# Patient Record
Sex: Female | Born: 1950
Health system: Southern US, Community
[De-identification: ages and names within clinical notes are randomized; demographics above are authoritative.]

## PROBLEM LIST (undated history)

## (undated) DIAGNOSIS — E119 Type 2 diabetes mellitus without complications: Secondary | ICD-10-CM

## (undated) DIAGNOSIS — H04123 Dry eye syndrome of bilateral lacrimal glands: Secondary | ICD-10-CM

## (undated) DIAGNOSIS — N879 Dysplasia of cervix uteri, unspecified: Secondary | ICD-10-CM

## (undated) DIAGNOSIS — R002 Palpitations: Secondary | ICD-10-CM

## (undated) DIAGNOSIS — Z9889 Other specified postprocedural states: Secondary | ICD-10-CM

## (undated) DIAGNOSIS — K2970 Gastritis, unspecified, without bleeding: Secondary | ICD-10-CM

## (undated) DIAGNOSIS — IMO0002 Reserved for concepts with insufficient information to code with codable children: Secondary | ICD-10-CM

## (undated) DIAGNOSIS — R600 Localized edema: Secondary | ICD-10-CM

## (undated) DIAGNOSIS — I10 Essential (primary) hypertension: Secondary | ICD-10-CM

## (undated) DIAGNOSIS — D509 Iron deficiency anemia, unspecified: Secondary | ICD-10-CM

## (undated) DIAGNOSIS — N83202 Unspecified ovarian cyst, left side: Secondary | ICD-10-CM

## (undated) DIAGNOSIS — M797 Fibromyalgia: Secondary | ICD-10-CM

## (undated) DIAGNOSIS — Z1231 Encounter for screening mammogram for malignant neoplasm of breast: Secondary | ICD-10-CM

## (undated) DIAGNOSIS — Z01419 Encounter for gynecological examination (general) (routine) without abnormal findings: Secondary | ICD-10-CM

## (undated) DIAGNOSIS — F419 Anxiety disorder, unspecified: Secondary | ICD-10-CM

## (undated) DIAGNOSIS — Z1211 Encounter for screening for malignant neoplasm of colon: Secondary | ICD-10-CM

## (undated) DIAGNOSIS — B009 Herpesviral infection, unspecified: Secondary | ICD-10-CM

## (undated) DIAGNOSIS — J310 Chronic rhinitis: Secondary | ICD-10-CM

## (undated) DIAGNOSIS — L270 Generalized skin eruption due to drugs and medicaments taken internally: Secondary | ICD-10-CM

## (undated) DIAGNOSIS — Z0000 Encounter for general adult medical examination without abnormal findings: Secondary | ICD-10-CM

## (undated) DIAGNOSIS — K5790 Diverticulosis of intestine, part unspecified, without perforation or abscess without bleeding: Secondary | ICD-10-CM

## (undated) DIAGNOSIS — I2510 Atherosclerotic heart disease of native coronary artery without angina pectoris: Secondary | ICD-10-CM

## (undated) DIAGNOSIS — K219 Gastro-esophageal reflux disease without esophagitis: Secondary | ICD-10-CM

## (undated) DIAGNOSIS — R05 Cough: Secondary | ICD-10-CM

## (undated) DIAGNOSIS — K209 Esophagitis, unspecified: Secondary | ICD-10-CM

## (undated) DIAGNOSIS — R739 Hyperglycemia, unspecified: Secondary | ICD-10-CM

## (undated) DIAGNOSIS — E785 Hyperlipidemia, unspecified: Secondary | ICD-10-CM

## (undated) DIAGNOSIS — R609 Edema, unspecified: Secondary | ICD-10-CM

## (undated) DIAGNOSIS — M8580 Other specified disorders of bone density and structure, unspecified site: Secondary | ICD-10-CM

## (undated) DIAGNOSIS — J849 Interstitial pulmonary disease, unspecified: Secondary | ICD-10-CM

## (undated) DIAGNOSIS — E039 Hypothyroidism, unspecified: Secondary | ICD-10-CM

## (undated) DIAGNOSIS — F32A Depression, unspecified: Secondary | ICD-10-CM

## (undated) DIAGNOSIS — I499 Cardiac arrhythmia, unspecified: Secondary | ICD-10-CM

## (undated) DIAGNOSIS — S62109A Fracture of unspecified carpal bone, unspecified wrist, initial encounter for closed fracture: Secondary | ICD-10-CM

## (undated) DIAGNOSIS — M1990 Unspecified osteoarthritis, unspecified site: Secondary | ICD-10-CM

## (undated) DIAGNOSIS — I7300 Raynaud's syndrome without gangrene: Secondary | ICD-10-CM

## (undated) DIAGNOSIS — M069 Rheumatoid arthritis, unspecified: Secondary | ICD-10-CM

## (undated) DIAGNOSIS — K2990 Gastroduodenitis, unspecified, without bleeding: Secondary | ICD-10-CM

## (undated) DIAGNOSIS — N289 Disorder of kidney and ureter, unspecified: Secondary | ICD-10-CM

## (undated) DIAGNOSIS — R112 Nausea with vomiting, unspecified: Secondary | ICD-10-CM

## (undated) DIAGNOSIS — R748 Abnormal levels of other serum enzymes: Secondary | ICD-10-CM

## (undated) DIAGNOSIS — Z889 Allergy status to unspecified drugs, medicaments and biological substances status: Secondary | ICD-10-CM

## (undated) HISTORY — DX: Encounter for screening for malignant neoplasm of colon: Z12.11

## (undated) HISTORY — DX: Other specified disorders of bone density and structure, unspecified site: M85.80

## (undated) HISTORY — DX: Abnormal levels of other serum enzymes: R74.8

## (undated) HISTORY — DX: Gastro-esophageal reflux disease without esophagitis: K21.9

## (undated) HISTORY — DX: Encounter for general adult medical examination without abnormal findings: Z00.00

## (undated) HISTORY — DX: Diverticulosis of intestine, part unspecified, without perforation or abscess without bleeding: K57.90

## (undated) HISTORY — DX: Chronic rhinitis: J31.0

## (undated) HISTORY — DX: Unspecified osteoarthritis, unspecified site: M19.90

## (undated) HISTORY — DX: Hyperglycemia, unspecified: R73.9

## (undated) HISTORY — DX: Iron deficiency anemia, unspecified: D50.9

## (undated) HISTORY — DX: Fracture of unspecified carpal bone, unspecified wrist, initial encounter for closed fracture: S62.109A

## (undated) HISTORY — DX: Unspecified ovarian cyst, left side: N83.202

## (undated) HISTORY — DX: Hypothyroidism, unspecified: E03.9

## (undated) HISTORY — DX: Dysplasia of cervix uteri, unspecified: N87.9

## (undated) HISTORY — DX: Generalized skin eruption due to drugs and medicaments taken internally: L27.0

## (undated) HISTORY — DX: Allergy status to unspecified drugs, medicaments and biological substances status: Z88.9

## (undated) HISTORY — DX: Essential (primary) hypertension: I10

## (undated) HISTORY — DX: Gastritis, unspecified, without bleeding: K29.70

## (undated) HISTORY — DX: Interstitial pulmonary disease, unspecified: J84.9

## (undated) HISTORY — DX: Palpitations: R00.2

## (undated) HISTORY — PX: HAND SURGERY: SHX662

## (undated) HISTORY — DX: Herpesviral infection, unspecified: B00.9

## (undated) HISTORY — DX: Reserved for concepts with insufficient information to code with codable children: IMO0002

## (undated) HISTORY — DX: Esophagitis, unspecified: K20.9

## (undated) HISTORY — DX: Raynaud's syndrome without gangrene: I73.00

## (undated) HISTORY — DX: Dry eye syndrome of bilateral lacrimal glands: H04.123

## (undated) HISTORY — DX: Hyperlipidemia, unspecified: E78.5

## (undated) HISTORY — DX: Cough: R05

## (undated) HISTORY — DX: Disorder of kidney and ureter, unspecified: N28.9

## (undated) HISTORY — DX: Localized edema: R60.0

## (undated) HISTORY — DX: Encounter for screening mammogram for malignant neoplasm of breast: Z12.31

## (undated) HISTORY — DX: Encounter for gynecological examination (general) (routine) without abnormal findings: Z01.419

## (undated) HISTORY — DX: Type 2 diabetes mellitus without complications: E11.9

## (undated) HISTORY — DX: Edema, unspecified: R60.9

## (undated) HISTORY — DX: Fibromyalgia: M79.7

## (undated) HISTORY — DX: Gastroduodenitis, unspecified, without bleeding: K29.90

## (undated) HISTORY — PX: LASIK: SHX215

## (undated) HISTORY — PX: ABDOMINAL EXPLORATION SURGERY: SHX538

---

## 1989-10-18 HISTORY — PX: OTHER SURGICAL HISTORY: SHX169

## 1997-11-18 DIAGNOSIS — IMO0002 Reserved for concepts with insufficient information to code with codable children: Secondary | ICD-10-CM

## 1997-11-18 HISTORY — DX: Reserved for concepts with insufficient information to code with codable children: IMO0002

## 2000-06-24 ENCOUNTER — Other Ambulatory Visit: Admission: RE | Admit: 2000-06-24 | Discharge: 2000-06-24 | Payer: Self-pay | Admitting: Family Medicine

## 2000-08-18 HISTORY — PX: COLONOSCOPY: SHX174

## 2000-09-05 ENCOUNTER — Encounter: Payer: Self-pay | Admitting: Gastroenterology

## 2001-08-10 ENCOUNTER — Other Ambulatory Visit: Admission: RE | Admit: 2001-08-10 | Discharge: 2001-08-10 | Payer: Self-pay | Admitting: Family Medicine

## 2002-09-21 ENCOUNTER — Other Ambulatory Visit: Admission: RE | Admit: 2002-09-21 | Discharge: 2002-09-21 | Payer: Self-pay | Admitting: Family Medicine

## 2003-03-01 ENCOUNTER — Other Ambulatory Visit: Admission: RE | Admit: 2003-03-01 | Discharge: 2003-03-01 | Payer: Self-pay | Admitting: Family Medicine

## 2003-09-11 ENCOUNTER — Other Ambulatory Visit: Admission: RE | Admit: 2003-09-11 | Discharge: 2003-09-11 | Payer: Self-pay | Admitting: Family Medicine

## 2004-08-31 ENCOUNTER — Ambulatory Visit: Payer: Self-pay | Admitting: Family Medicine

## 2004-10-29 ENCOUNTER — Ambulatory Visit: Payer: Self-pay | Admitting: Family Medicine

## 2005-02-26 ENCOUNTER — Ambulatory Visit: Payer: Self-pay | Admitting: Family Medicine

## 2005-05-14 ENCOUNTER — Ambulatory Visit: Payer: Self-pay | Admitting: Family Medicine

## 2005-05-26 ENCOUNTER — Ambulatory Visit: Payer: Self-pay

## 2005-06-18 ENCOUNTER — Ambulatory Visit: Payer: Self-pay | Admitting: Family Medicine

## 2005-06-25 ENCOUNTER — Ambulatory Visit: Payer: Self-pay | Admitting: Family Medicine

## 2005-06-25 ENCOUNTER — Other Ambulatory Visit: Admission: RE | Admit: 2005-06-25 | Discharge: 2005-06-25 | Payer: Self-pay | Admitting: Family Medicine

## 2005-06-25 LAB — CONVERTED CEMR LAB: Pap Smear: NORMAL

## 2005-08-17 ENCOUNTER — Ambulatory Visit: Payer: Self-pay | Admitting: Family Medicine

## 2005-08-31 ENCOUNTER — Ambulatory Visit: Payer: Self-pay | Admitting: Family Medicine

## 2006-01-19 ENCOUNTER — Ambulatory Visit: Payer: Self-pay | Admitting: Family Medicine

## 2006-01-28 ENCOUNTER — Ambulatory Visit: Payer: Self-pay | Admitting: Family Medicine

## 2006-08-12 ENCOUNTER — Ambulatory Visit: Payer: Self-pay | Admitting: Family Medicine

## 2006-09-24 ENCOUNTER — Ambulatory Visit: Payer: Self-pay | Admitting: Family Medicine

## 2006-09-26 ENCOUNTER — Ambulatory Visit: Payer: Self-pay | Admitting: Family Medicine

## 2006-11-10 ENCOUNTER — Ambulatory Visit: Payer: Self-pay | Admitting: Family Medicine

## 2006-11-10 LAB — CONVERTED CEMR LAB
Hemoglobin: 10.6 g/dL — ABNORMAL LOW (ref 12.0–15.0)
Iron: 42 ug/dL (ref 42–145)
Saturation Ratios: 7.4 % — ABNORMAL LOW (ref 20.0–50.0)
TSH: 0.26 microintl units/mL — ABNORMAL LOW (ref 0.35–5.50)
Transferrin: 405.5 mg/dL — ABNORMAL HIGH (ref >=212.0)

## 2006-12-08 ENCOUNTER — Ambulatory Visit: Payer: Self-pay | Admitting: Family Medicine

## 2006-12-22 ENCOUNTER — Ambulatory Visit: Payer: Self-pay | Admitting: Family Medicine

## 2006-12-22 LAB — CONVERTED CEMR LAB: TSH: 7.62 microintl units/mL — ABNORMAL HIGH (ref 0.35–5.50)

## 2006-12-30 ENCOUNTER — Ambulatory Visit: Payer: Self-pay | Admitting: Family Medicine

## 2007-01-11 ENCOUNTER — Ambulatory Visit: Payer: Self-pay | Admitting: Family Medicine

## 2007-01-24 ENCOUNTER — Ambulatory Visit: Payer: Self-pay | Admitting: Family Medicine

## 2007-01-24 LAB — CONVERTED CEMR LAB
Eosinophils Relative: 5.3 % — ABNORMAL HIGH (ref 0.0–5.0)
Free T4: 1.1 ng/dL (ref 0.6–1.6)
Hemoglobin: 12.6 g/dL (ref 12.0–15.0)
Lymphocytes Relative: 26.2 % (ref 12.0–46.0)
MCHC: 33.6 g/dL (ref 30.0–36.0)
MCV: 81.1 fL (ref 78.0–100.0)
Monocytes Absolute: 0.7 10*3/uL (ref 0.2–0.7)
Monocytes Relative: 11.7 % — ABNORMAL HIGH (ref 3.0–11.0)
Neutro Abs: 3.5 10*3/uL (ref 1.4–7.7)
Platelets: 365 10*3/uL (ref 150–400)
RBC: 4.62 M/uL (ref 3.87–5.11)
TSH: 0.67 microintl units/mL (ref 0.35–5.50)

## 2007-03-08 ENCOUNTER — Telehealth (INDEPENDENT_AMBULATORY_CARE_PROVIDER_SITE_OTHER): Payer: Self-pay | Admitting: *Deleted

## 2007-03-24 ENCOUNTER — Telehealth: Payer: Self-pay | Admitting: Family Medicine

## 2007-03-28 ENCOUNTER — Encounter: Payer: Self-pay | Admitting: Family Medicine

## 2007-03-28 DIAGNOSIS — K2970 Gastritis, unspecified, without bleeding: Secondary | ICD-10-CM

## 2007-03-28 DIAGNOSIS — F410 Panic disorder [episodic paroxysmal anxiety] without agoraphobia: Secondary | ICD-10-CM | POA: Insufficient documentation

## 2007-03-28 DIAGNOSIS — I7300 Raynaud's syndrome without gangrene: Secondary | ICD-10-CM | POA: Insufficient documentation

## 2007-03-28 DIAGNOSIS — E1169 Type 2 diabetes mellitus with other specified complication: Secondary | ICD-10-CM | POA: Insufficient documentation

## 2007-03-28 DIAGNOSIS — D508 Other iron deficiency anemias: Secondary | ICD-10-CM | POA: Insufficient documentation

## 2007-03-28 DIAGNOSIS — K209 Esophagitis, unspecified without bleeding: Secondary | ICD-10-CM | POA: Insufficient documentation

## 2007-03-28 DIAGNOSIS — E039 Hypothyroidism, unspecified: Secondary | ICD-10-CM | POA: Insufficient documentation

## 2007-03-28 DIAGNOSIS — Z87898 Personal history of other specified conditions: Secondary | ICD-10-CM | POA: Insufficient documentation

## 2007-03-28 DIAGNOSIS — K2990 Gastroduodenitis, unspecified, without bleeding: Secondary | ICD-10-CM

## 2007-03-28 DIAGNOSIS — I499 Cardiac arrhythmia, unspecified: Secondary | ICD-10-CM | POA: Insufficient documentation

## 2007-03-28 DIAGNOSIS — E782 Mixed hyperlipidemia: Secondary | ICD-10-CM

## 2007-03-28 DIAGNOSIS — I059 Rheumatic mitral valve disease, unspecified: Secondary | ICD-10-CM | POA: Insufficient documentation

## 2007-03-28 DIAGNOSIS — K219 Gastro-esophageal reflux disease without esophagitis: Secondary | ICD-10-CM | POA: Insufficient documentation

## 2007-03-28 DIAGNOSIS — H16229 Keratoconjunctivitis sicca, not specified as Sjogren's, unspecified eye: Secondary | ICD-10-CM | POA: Insufficient documentation

## 2007-03-28 DIAGNOSIS — L719 Rosacea, unspecified: Secondary | ICD-10-CM | POA: Insufficient documentation

## 2007-03-28 DIAGNOSIS — M722 Plantar fascial fibromatosis: Secondary | ICD-10-CM | POA: Insufficient documentation

## 2007-03-28 DIAGNOSIS — K589 Irritable bowel syndrome without diarrhea: Secondary | ICD-10-CM | POA: Insufficient documentation

## 2007-03-28 HISTORY — DX: Gastritis, unspecified, without bleeding: K29.70

## 2007-03-28 HISTORY — DX: Esophagitis, unspecified without bleeding: K20.90

## 2007-03-28 HISTORY — DX: Gastroduodenitis, unspecified, without bleeding: K29.90

## 2007-03-29 ENCOUNTER — Ambulatory Visit: Payer: Self-pay | Admitting: Family Medicine

## 2007-03-29 DIAGNOSIS — R609 Edema, unspecified: Secondary | ICD-10-CM | POA: Insufficient documentation

## 2007-04-20 ENCOUNTER — Ambulatory Visit: Payer: Self-pay | Admitting: Family Medicine

## 2007-04-20 LAB — CONVERTED CEMR LAB
Glucose, Urine, Semiquant: NEGATIVE
Ketones, urine, test strip: NEGATIVE
Protein, U semiquant: NEGATIVE
RBC / HPF: 0
WBC Urine, dipstick: NEGATIVE
Yeast, UA: 0
pH: 6.5

## 2007-05-24 ENCOUNTER — Ambulatory Visit: Payer: Self-pay | Admitting: Family Medicine

## 2007-07-25 ENCOUNTER — Encounter: Payer: Self-pay | Admitting: Family Medicine

## 2007-07-26 ENCOUNTER — Telehealth: Payer: Self-pay | Admitting: Family Medicine

## 2007-07-27 ENCOUNTER — Encounter: Payer: Self-pay | Admitting: Family Medicine

## 2007-08-03 ENCOUNTER — Telehealth: Payer: Self-pay | Admitting: Family Medicine

## 2007-08-07 ENCOUNTER — Encounter: Payer: Self-pay | Admitting: Family Medicine

## 2007-08-07 ENCOUNTER — Encounter: Payer: Self-pay | Admitting: Cardiovascular Disease

## 2007-08-09 ENCOUNTER — Telehealth (INDEPENDENT_AMBULATORY_CARE_PROVIDER_SITE_OTHER): Payer: Self-pay | Admitting: *Deleted

## 2007-08-11 ENCOUNTER — Encounter: Payer: Self-pay | Admitting: Family Medicine

## 2007-08-11 ENCOUNTER — Encounter: Payer: Self-pay | Admitting: Cardiovascular Disease

## 2007-08-29 ENCOUNTER — Ambulatory Visit: Payer: Self-pay | Admitting: Family Medicine

## 2007-09-25 ENCOUNTER — Encounter: Payer: Self-pay | Admitting: Family Medicine

## 2007-11-20 ENCOUNTER — Telehealth: Payer: Self-pay | Admitting: Family Medicine

## 2007-11-24 ENCOUNTER — Encounter: Payer: Self-pay | Admitting: Family Medicine

## 2008-01-02 ENCOUNTER — Telehealth: Payer: Self-pay | Admitting: Family Medicine

## 2008-02-23 ENCOUNTER — Telehealth: Payer: Self-pay | Admitting: Family Medicine

## 2008-03-05 ENCOUNTER — Telehealth: Payer: Self-pay | Admitting: Family Medicine

## 2008-03-15 ENCOUNTER — Ambulatory Visit: Payer: Self-pay | Admitting: Family Medicine

## 2008-03-15 LAB — CONVERTED CEMR LAB
Bilirubin Urine: NEGATIVE
Glucose, Urine, Semiquant: NEGATIVE
Ketones, urine, test strip: NEGATIVE
RBC / HPF: 0
WBC Urine, dipstick: NEGATIVE
pH: 7

## 2008-03-18 ENCOUNTER — Ambulatory Visit: Payer: Self-pay | Admitting: Family Medicine

## 2008-03-25 ENCOUNTER — Encounter: Payer: Self-pay | Admitting: Family Medicine

## 2008-03-25 ENCOUNTER — Ambulatory Visit: Payer: Self-pay | Admitting: Family Medicine

## 2008-03-25 ENCOUNTER — Other Ambulatory Visit: Admission: RE | Admit: 2008-03-25 | Discharge: 2008-03-25 | Payer: Self-pay | Admitting: Family Medicine

## 2008-03-25 DIAGNOSIS — M8580 Other specified disorders of bone density and structure, unspecified site: Secondary | ICD-10-CM | POA: Insufficient documentation

## 2008-03-25 DIAGNOSIS — F411 Generalized anxiety disorder: Secondary | ICD-10-CM | POA: Insufficient documentation

## 2008-03-26 ENCOUNTER — Ambulatory Visit: Payer: Self-pay | Admitting: Family Medicine

## 2008-03-26 ENCOUNTER — Encounter: Payer: Self-pay | Admitting: Family Medicine

## 2008-03-27 ENCOUNTER — Encounter (INDEPENDENT_AMBULATORY_CARE_PROVIDER_SITE_OTHER): Payer: Self-pay | Admitting: *Deleted

## 2008-03-27 LAB — CONVERTED CEMR LAB
ALT: 17 units/L (ref 0–35)
Albumin: 3.8 g/dL (ref 3.5–5.2)
Alkaline Phosphatase: 61 units/L (ref 39–117)
CO2: 28 meq/L (ref 19–32)
Calcium: 8.9 mg/dL (ref 8.4–10.5)
Cholesterol: 185 mg/dL (ref 0–200)
GFR calc non Af Amer: 68 mL/min
HCT: 40.2 % (ref 36.0–46.0)
Hemoglobin: 13.8 g/dL (ref 12.0–15.0)
LDL Cholesterol: 122 mg/dL — ABNORMAL HIGH (ref 0–99)
MCV: 90 fL (ref 78.0–100.0)
Neutro Abs: 2.6 10*3/uL (ref 1.4–7.7)
Potassium: 4.3 meq/L (ref 3.5–5.1)
RBC: 4.47 M/uL (ref 3.87–5.11)
Sodium: 139 meq/L (ref 135–145)
Total Bilirubin: 0.5 mg/dL (ref 0.3–1.2)
Total Protein: 6.6 g/dL (ref 6.0–8.3)
Vit D, 1,25-Dihydroxy: 19 — ABNORMAL LOW (ref 30–89)

## 2008-03-29 ENCOUNTER — Telehealth: Payer: Self-pay | Admitting: Family Medicine

## 2008-03-29 ENCOUNTER — Encounter (INDEPENDENT_AMBULATORY_CARE_PROVIDER_SITE_OTHER): Payer: Self-pay | Admitting: *Deleted

## 2008-03-29 LAB — CONVERTED CEMR LAB: Pap Smear: NORMAL

## 2008-04-03 ENCOUNTER — Telehealth: Payer: Self-pay | Admitting: Family Medicine

## 2008-04-04 ENCOUNTER — Encounter (INDEPENDENT_AMBULATORY_CARE_PROVIDER_SITE_OTHER): Payer: Self-pay | Admitting: *Deleted

## 2008-04-25 ENCOUNTER — Telehealth: Payer: Self-pay | Admitting: Family Medicine

## 2008-05-13 ENCOUNTER — Ambulatory Visit: Payer: Self-pay | Admitting: Family Medicine

## 2008-05-13 DIAGNOSIS — R739 Hyperglycemia, unspecified: Secondary | ICD-10-CM | POA: Insufficient documentation

## 2008-05-13 DIAGNOSIS — E559 Vitamin D deficiency, unspecified: Secondary | ICD-10-CM | POA: Insufficient documentation

## 2008-05-13 HISTORY — DX: Hyperglycemia, unspecified: R73.9

## 2008-05-15 LAB — CONVERTED CEMR LAB
BUN: 15 mg/dL (ref 6–23)
CO2: 27 meq/L (ref 19–32)
Calcium: 9.2 mg/dL (ref 8.4–10.5)
GFR calc Af Amer: 83 mL/min
Phosphorus: 4.9 mg/dL — ABNORMAL HIGH (ref 2.3–4.6)
Sodium: 141 meq/L (ref 135–145)
TSH: 0.43 microintl units/mL (ref 0.35–5.50)
Vit D, 1,25-Dihydroxy: 33 (ref 30–89)

## 2008-05-29 ENCOUNTER — Ambulatory Visit: Payer: Self-pay | Admitting: Family Medicine

## 2008-05-30 ENCOUNTER — Encounter: Payer: Self-pay | Admitting: Family Medicine

## 2008-06-10 ENCOUNTER — Telehealth (INDEPENDENT_AMBULATORY_CARE_PROVIDER_SITE_OTHER): Payer: Self-pay | Admitting: *Deleted

## 2008-06-14 ENCOUNTER — Ambulatory Visit: Payer: Self-pay | Admitting: Family Medicine

## 2008-06-14 DIAGNOSIS — F418 Other specified anxiety disorders: Secondary | ICD-10-CM | POA: Insufficient documentation

## 2008-06-14 DIAGNOSIS — F329 Major depressive disorder, single episode, unspecified: Secondary | ICD-10-CM

## 2008-08-14 ENCOUNTER — Ambulatory Visit: Payer: Self-pay | Admitting: Family Medicine

## 2008-08-15 LAB — CONVERTED CEMR LAB
AST: 20 units/L (ref 0–37)
Albumin: 4 g/dL (ref 3.5–5.2)
Alkaline Phosphatase: 76 units/L (ref 39–117)
Bilirubin, Direct: 0.1 mg/dL (ref 0.0–0.3)
Calcium: 8.8 mg/dL (ref 8.4–10.5)
Chloride: 105 meq/L (ref 96–112)
Direct LDL: 123.3 mg/dL
GFR calc non Af Amer: 78 mL/min
HDL: 55 mg/dL (ref >39.0)
Hgb A1c MFr Bld: 6.1 % — ABNORMAL HIGH (ref 4.6–6.0)
TSH: 0.71 microintl units/mL (ref 0.35–5.50)
Total Bilirubin: 0.5 mg/dL (ref 0.3–1.2)
Total CHOL/HDL Ratio: 3.9
VLDL: 14 mg/dL (ref 0–40)

## 2008-09-13 ENCOUNTER — Telehealth: Payer: Self-pay | Admitting: Family Medicine

## 2008-09-13 ENCOUNTER — Encounter: Payer: Self-pay | Admitting: Family Medicine

## 2008-10-01 ENCOUNTER — Ambulatory Visit: Payer: Self-pay | Admitting: Family Medicine

## 2008-11-15 ENCOUNTER — Ambulatory Visit: Payer: Self-pay | Admitting: Family Medicine

## 2008-12-09 ENCOUNTER — Telehealth: Payer: Self-pay | Admitting: Family Medicine

## 2008-12-09 ENCOUNTER — Ambulatory Visit: Payer: Self-pay | Admitting: Family Medicine

## 2009-03-03 ENCOUNTER — Encounter: Payer: Self-pay | Admitting: Family Medicine

## 2009-08-01 ENCOUNTER — Ambulatory Visit: Payer: Self-pay | Admitting: Family Medicine

## 2009-08-04 ENCOUNTER — Telehealth: Payer: Self-pay | Admitting: Family Medicine

## 2009-08-04 LAB — CONVERTED CEMR LAB
ALT: 23 units/L (ref 0–35)
Albumin: 4.4 g/dL (ref 3.5–5.2)
BUN: 20 mg/dL (ref 6–23)
Calcium: 8.9 mg/dL (ref 8.4–10.5)
Cholesterol: 249 mg/dL — ABNORMAL HIGH (ref 0–200)
Direct LDL: 163.3 mg/dL
GFR calc non Af Amer: 77.9 mL/min (ref >60)
Glucose, Bld: 121 mg/dL — ABNORMAL HIGH (ref 70–99)
HDL: 56.5 mg/dL (ref >39.00)
Total CHOL/HDL Ratio: 4
Triglycerides: 115 mg/dL (ref 0.0–149.0)
VLDL: 23 mg/dL (ref 0.0–40.0)

## 2009-08-12 ENCOUNTER — Telehealth: Payer: Self-pay | Admitting: Family Medicine

## 2009-08-25 ENCOUNTER — Ambulatory Visit: Payer: Self-pay | Admitting: Family Medicine

## 2009-09-19 ENCOUNTER — Telehealth: Payer: Self-pay | Admitting: Family Medicine

## 2009-11-24 ENCOUNTER — Ambulatory Visit: Payer: Self-pay | Admitting: Family Medicine

## 2009-11-24 LAB — CONVERTED CEMR LAB
ALT: 19 units/L (ref 0–35)
Albumin: 4.1 g/dL (ref 3.5–5.2)
BUN: 18 mg/dL (ref 6–23)
Chloride: 105 meq/L (ref 96–112)
Cholesterol: 224 mg/dL — ABNORMAL HIGH (ref 0–200)
Creatinine, Ser: 0.8 mg/dL (ref 0.4–1.2)
Direct LDL: 146.1 mg/dL
GFR calc non Af Amer: 77.82 mL/min (ref >60)
Glucose, Bld: 114 mg/dL — ABNORMAL HIGH (ref 70–99)
Hgb A1c MFr Bld: 6.2 % (ref 4.6–6.5)
Potassium: 4.6 meq/L (ref 3.5–5.1)
Sodium: 142 meq/L (ref 135–145)
TSH: 0.42 microintl units/mL (ref 0.35–5.50)
Total CHOL/HDL Ratio: 4
VLDL: 15.4 mg/dL (ref 0.0–40.0)

## 2009-11-28 ENCOUNTER — Ambulatory Visit: Payer: Self-pay | Admitting: Family Medicine

## 2009-12-02 ENCOUNTER — Telehealth: Payer: Self-pay | Admitting: Gastroenterology

## 2009-12-02 ENCOUNTER — Encounter (INDEPENDENT_AMBULATORY_CARE_PROVIDER_SITE_OTHER): Payer: Self-pay | Admitting: *Deleted

## 2009-12-10 ENCOUNTER — Telehealth: Payer: Self-pay | Admitting: Family Medicine

## 2009-12-11 ENCOUNTER — Encounter (INDEPENDENT_AMBULATORY_CARE_PROVIDER_SITE_OTHER): Payer: Self-pay | Admitting: *Deleted

## 2009-12-11 ENCOUNTER — Ambulatory Visit: Payer: Self-pay | Admitting: Family Medicine

## 2009-12-11 ENCOUNTER — Ambulatory Visit: Payer: Self-pay | Admitting: Gastroenterology

## 2009-12-11 DIAGNOSIS — Z789 Other specified health status: Secondary | ICD-10-CM | POA: Insufficient documentation

## 2009-12-15 ENCOUNTER — Ambulatory Visit (HOSPITAL_COMMUNITY): Admission: RE | Admit: 2009-12-15 | Discharge: 2009-12-15 | Payer: Self-pay | Admitting: Gastroenterology

## 2009-12-16 ENCOUNTER — Telehealth: Payer: Self-pay | Admitting: Gastroenterology

## 2009-12-26 ENCOUNTER — Ambulatory Visit: Payer: Self-pay | Admitting: Gastroenterology

## 2009-12-26 LAB — HM COLONOSCOPY

## 2010-01-05 ENCOUNTER — Encounter: Payer: Self-pay | Admitting: Gastroenterology

## 2010-03-05 ENCOUNTER — Encounter: Payer: Self-pay | Admitting: Family Medicine

## 2010-03-12 ENCOUNTER — Encounter: Payer: Self-pay | Admitting: Family Medicine

## 2010-03-18 ENCOUNTER — Ambulatory Visit: Payer: Self-pay | Admitting: Family Medicine

## 2010-03-18 DIAGNOSIS — N390 Urinary tract infection, site not specified: Secondary | ICD-10-CM | POA: Insufficient documentation

## 2010-03-18 DIAGNOSIS — Z889 Allergy status to unspecified drugs, medicaments and biological substances status: Secondary | ICD-10-CM

## 2010-03-18 HISTORY — DX: Allergy status to unspecified drugs, medicaments and biological substances: Z88.9

## 2010-03-18 LAB — CONVERTED CEMR LAB
Bilirubin Urine: NEGATIVE
Casts: 0 /lpf
Ketones, urine, test strip: NEGATIVE
Nitrite: NEGATIVE
Urobilinogen, UA: 0.2

## 2010-03-20 ENCOUNTER — Encounter: Payer: Self-pay | Admitting: Family Medicine

## 2010-03-23 ENCOUNTER — Encounter: Payer: Self-pay | Admitting: Family Medicine

## 2010-04-22 ENCOUNTER — Encounter: Payer: Self-pay | Admitting: Cardiovascular Disease

## 2010-04-27 ENCOUNTER — Ambulatory Visit: Payer: Self-pay | Admitting: Cardiovascular Disease

## 2010-04-27 DIAGNOSIS — R002 Palpitations: Secondary | ICD-10-CM | POA: Insufficient documentation

## 2010-04-28 ENCOUNTER — Encounter: Payer: Self-pay | Admitting: Cardiovascular Disease

## 2010-05-07 ENCOUNTER — Encounter: Payer: Self-pay | Admitting: Cardiovascular Disease

## 2010-05-26 ENCOUNTER — Encounter: Payer: Self-pay | Admitting: Family Medicine

## 2010-06-13 ENCOUNTER — Ambulatory Visit: Payer: Self-pay | Admitting: Family Medicine

## 2010-07-21 ENCOUNTER — Telehealth: Payer: Self-pay | Admitting: Cardiovascular Disease

## 2010-09-22 ENCOUNTER — Telehealth: Payer: Self-pay | Admitting: Family Medicine

## 2010-10-05 ENCOUNTER — Telehealth: Payer: Self-pay | Admitting: Family Medicine

## 2010-10-05 ENCOUNTER — Encounter: Payer: Self-pay | Admitting: Family Medicine

## 2010-11-09 ENCOUNTER — Telehealth: Payer: Self-pay | Admitting: Family Medicine

## 2010-11-17 NOTE — Assessment & Plan Note (Signed)
Summary: COUGH/CLE   Vital Signs:  Patient profile:   60 year old female Height:      62 inches Weight:      202.13 pounds BMI:     37.10 Temp:     99.4 degrees F oral Pulse rate:   88 / minute Pulse rhythm:   regular BP sitting:   102 / 78  (left arm) Cuff size:   large  Vitals Entered By: Delilah Shan CMA Duncan Dull) (December 11, 2009 3:02 PM) CC: Cough   History of Present Illness: 60 yo with 1 week of worsening sinus congestion, frontal headache, runny nose, dry cough. Fever Tmax 101.  No wheezing or shortness of breath. Taking OTC Mucinex with some relief of symptoms. Coughing spells at night, can't get any sleep. No n/v/d. Ears popping.  Current Medications (verified): 1)  Nexium 40 Mg Cpdr (Esomeprazole Magnesium) .... Take One By Mouth Two Times A Day 2)  Altace 10 Mg  Caps (Ramipril) .Marland Kitchen.. 1 By Mouth Once Daily 3)  Synthroid 125 Mcg  Tabs (Levothyroxine Sodium) .... Take One By Mouth Daily 4)  Fish Oil 1000 Mg Caps (Omega-3 Fatty Acids) .... Take One By Mouth Daily 5)  Iron 325 (65 Fe) Mg Tabs (Ferrous Sulfate) .... Take One By Mouth At Bedtime 6)  Maxalt-Mlt 10 Mg Tbdp (Rizatriptan Benzoate) .... Take One By Mouth As Needed As Directed For Migraine 7)  Valtrex 1 Gm Tabs (Valacyclovir Hcl) .... Take Two By Mouth Two Times A Day Prn 8)  Cyclobenzaprine Hcl 10 Mg Tabs (Cyclobenzaprine Hcl) .... Take 1/2 To 1 By Mouth Once Daily and 1 By Mouth At Bedtime 9)  Triamcinolone .1% .... Use On Aff Area Once Daily As Needed (Legs) 10)  Vitamin D 1000 Unit  Tabs (Cholecalciferol) .... Daily 11)  Wellbutrin Xl 150 Mg Xr24h-Tab (Bupropion Hcl) .Marland Kitchen.. 1 By Mouth Once Daily 12)  Hycodan .Marland Kitchen.. 1 Tsp Every 4-6hrs As Needed Cough 13)  Flonase 50 Mcg/act Susp (Fluticasone Propionate) .... Two Spray Each Nostril Daily As Directed 14)  Cardizem La 120 Mg Xr24h-Tab (Diltiazem Hcl Coated Beads) .... Take 1 Tablet By Mouth Once A Day 15)  Aleve 220 Mg Tabs (Naproxen Sodium) .... One Tab Twice A  Day 16)  Multivitamins   Tabs (Multiple Vitamin) .... One Daily 17)  Probiotic  Caps (Probiotic Product) .... Otc As Directed. 18)  Onetouch Test  Strp (Glucose Blood) .... Use As Directed 19)  Aspirin 81 Mg Tabs (Aspirin) .Marland Kitchen.. 1 By Mouth Once Daily 20)  Magnesium ?mg .... Take One Daily 21)  Zantac 300 Mg Tabs (Ranitidine Hcl) .... Take One By Mouth Once Daily 22)  Moviprep 100 Gm  Solr (Peg-Kcl-Nacl-Nasulf-Na Asc-C) .... As Per Prep Instructions. 23)  Levsin/sl 0.125 Mg Subl (Hyoscyamine Sulfate) .Marland Kitchen.. 1 Sl Q 4-6- Hrs As Needed 24)  Zantac 300 Mg Tabs (Ranitidine Hcl) .... Take 1 Tablet By Mouth Once A Day 25)  Avelox 400 Mg  Tabs (Moxifloxacin Hcl) .Marland Kitchen.. 1 Tablet By Mouth Daily X 10 Days 26)  Cheratussin Ac 100-10 Mg/57ml Syrp (Guaifenesin-Codeine) .... 5 Ml Two Times A Day As Needed Cough  Allergies: 1)  ! * Demadex 2)  ! * Aleve 3)  ! Elavil 4)  ! Effexor 5)  ! Paxil 6)  ! Prozac 7)  ! Lyrica 8)  ! Lipitor 9)  Penicillin 10)  Ceftin 11)  Lodine 12)  Tetracycline 13)  Erythromycin 14)  Sulfa 15)  * Zyrtec 16)  Cardizem 17)  Neurontin 18)  Lasix 19)  * Imapramine  Review of Systems      See HPI General:  Complains of chills and fever. ENT:  Complains of earache, nasal congestion, and sinus pressure; denies ear discharge. CV:  Denies chest pain or discomfort. Resp:  Complains of cough; denies shortness of breath, sputum productive, and wheezing.  Physical Exam  General:  overweight but generally well appearing, sounds congested non toxic  Ears:  TMs retracted bilaterally. Nose:  nasal dischargemucosal pallor.   Mouth:  pharynx pink and moist.   Lungs:  Normal respiratory effort, chest expands symmetrically. Lungs are clear to auscultation, no crackles or wheezes. Heart:  Normal rate and regular rhythm. S1 and S2 normal without gallop, murmur, click, rub or other extra sounds. Psych:  normal affect, talkative and pleasant    Impression & Recommendations:  Problem  # 1:  OTHER ACUTE SINUSITIS (ICD-461.8) Assessment New Given duration and progression of symptoms, will treat for bacterial sinusitis with Avelox (has allergy to most abx). Supportive care as per pt instructions. Her updated medication list for this problem includes:    Flonase 50 Mcg/act Susp (Fluticasone propionate) .Marland Kitchen..Marland Kitchen Two spray each nostril daily as directed    Avelox 400 Mg Tabs (Moxifloxacin hcl) .Marland Kitchen... 1 tablet by mouth daily x 10 days    Cheratussin Ac 100-10 Mg/15ml Syrp (Guaifenesin-codeine) .Marland KitchenMarland KitchenMarland KitchenMarland Kitchen 5 ml two times a day as needed cough  Complete Medication List: 1)  Nexium 40 Mg Cpdr (Esomeprazole magnesium) .... Take one by mouth two times a day 2)  Altace 10 Mg Caps (Ramipril) .Marland Kitchen.. 1 by mouth once daily 3)  Synthroid 125 Mcg Tabs (Levothyroxine sodium) .... Take one by mouth daily 4)  Fish Oil 1000 Mg Caps (Omega-3 fatty acids) .... Take one by mouth daily 5)  Iron 325 (65 Fe) Mg Tabs (Ferrous sulfate) .... Take one by mouth at bedtime 6)  Maxalt-mlt 10 Mg Tbdp (Rizatriptan benzoate) .... Take one by mouth as needed as directed for migraine 7)  Valtrex 1 Gm Tabs (Valacyclovir hcl) .... Take two by mouth two times a day prn 8)  Cyclobenzaprine Hcl 10 Mg Tabs (Cyclobenzaprine hcl) .... Take 1/2 to 1 by mouth once daily and 1 by mouth at bedtime 9)  Triamcinolone .1%  .... Use on aff area once daily as needed (legs) 10)  Vitamin D 1000 Unit Tabs (Cholecalciferol) .... Daily 11)  Wellbutrin Xl 150 Mg Xr24h-tab (Bupropion hcl) .Marland Kitchen.. 1 by mouth once daily 12)  Hycodan  .Marland Kitchen.. 1 tsp every 4-6hrs as needed cough 13)  Flonase 50 Mcg/act Susp (Fluticasone propionate) .... Two spray each nostril daily as directed 14)  Cardizem La 120 Mg Xr24h-tab (Diltiazem hcl coated beads) .... Take 1 tablet by mouth once a day 15)  Aleve 220 Mg Tabs (Naproxen sodium) .... One tab twice a day 16)  Multivitamins Tabs (Multiple vitamin) .... One daily 17)  Probiotic Caps (Probiotic product) .... Otc as  directed. 18)  Onetouch Test Strp (Glucose blood) .... Use as directed 19)  Aspirin 81 Mg Tabs (Aspirin) .Marland Kitchen.. 1 by mouth once daily 20)  Magnesium ?mg  .... Take one daily 21)  Zantac 300 Mg Tabs (Ranitidine hcl) .... Take one by mouth once daily 22)  Moviprep 100 Gm Solr (Peg-kcl-nacl-nasulf-na asc-c) .... As per prep instructions. 23)  Levsin/sl 0.125 Mg Subl (Hyoscyamine sulfate) .Marland Kitchen.. 1 sl q 4-6- hrs as needed 24)  Zantac 300 Mg Tabs (Ranitidine hcl) .... Take 1 tablet by mouth once a  day 25)  Avelox 400 Mg Tabs (Moxifloxacin hcl) .Marland Kitchen.. 1 tablet by mouth daily x 10 days 26)  Cheratussin Ac 100-10 Mg/74ml Syrp (Guaifenesin-codeine) .... 5 ml two times a day as needed cough  Patient Instructions: 1)  Take Avelox as directed.  Drink lots of fluids.  Treat sympotmatically with Mucinex, nasal saline irrigation, and Tylenol/Ibuprofen. Also try claritin D or zyrtec D over the counter- two times a day as needed ( have to sign for them at pharmacy). You can use warm compresses.  Cough suppressant at night. Call if not improving as expected in 5-7 days.  Prescriptions: CHERATUSSIN AC 100-10 MG/5ML SYRP (GUAIFENESIN-CODEINE) 5 ml two times a day as needed cough  #4 ounces x 0   Entered and Authorized by:   Ruthe Mannan MD   Signed by:   Ruthe Mannan MD on 12/11/2009   Method used:   Print then Give to Patient   RxID:   [id number] AVELOX 400 MG  TABS (MOXIFLOXACIN HCL) 1 tablet by mouth daily x 10 days  #20 x 0   Entered and Authorized by:   Ruthe Mannan MD   Signed by:   Ruthe Mannan MD on 12/11/2009   Method used:   Electronically to        [facility] [phone]* (retail)       [address] [place], [place]  [address]       Ph: [phone] or [phone]       Fax: [phone]   RxID:   [id number]   Current Allergies (reviewed today): ! * [facility] ! * ALEVE ! ELAVIL ! [facility] ! PAXIL ! PROZAC ! LYRICA !  LIPITOR PENICILLIN CEFTIN LODINE TETRACYCLINE ERYTHROMYCIN SULFA * ZYRTEC CARDIZEM NEURONTIN LASIX * IMAPRAMINE

## 2010-11-17 NOTE — Progress Notes (Signed)
Summary: MEDICATION  Medications Added METOPROLOL SUCCINATE 50 MG XR24H-TAB (METOPROLOL SUCCINATE) one tablet once daily       Phone Note Call from Patient Call back at [facility] Phone [phone] Call back at Work Phone [phone]   Caller: SELF Call For: [facility] Summary of Call: PT WOULD LIKE THE TIMED RELEASE OF METOPROLOL[facility] [address] Initial call taken by: Harlon Flor,  July 21, 2010 9:51 AM    New/Updated Medications: METOPROLOL SUCCINATE 50 MG XR24H-TAB (METOPROLOL SUCCINATE) one tablet once daily Prescriptions: METOPROLOL SUCCINATE 50 MG XR24H-TAB (METOPROLOL SUCCINATE) one tablet once daily  #30 x 1   Entered by:   Bishop Dublin, CMA   Authorized by:   Dossie Arbour MD   Signed by:   Bishop Dublin, CMA on 07/21/2010   Method used:   [facility]ron[facility] to        [facility] [phone]* (retail)       [address] [place], [place]  [address]       Ph: [phone] or [phone]       Fax: [phone]   RxID:   [id number]

## 2010-11-17 NOTE — Procedures (Signed)
Summary: Colon   Colonoscopy  Procedure date:  09/05/2000  Findings:      Location:  [facility].    Colonoscopy  Procedure date:  09/05/2000  Findings:      Location:  [facility].   Patient Name: Tina Berger, Tina Berger MRN:  Procedure Procedures: Colonoscopy CPT: [phone].  Personnel: Endoscopist: Vania Rea. Jarold Motto, MD.  Referred By: Roxy Manns, MD. Sheppard Plumber Earlene Plater, MD.  Indications Symptoms: Constipation Patient has difficulty evacuating, strains with stool passage. Hematochezia.  History  Pre-Exam Physical: Performed Sep 05, 2000. Cardio-pulmonary exam, Rectal exam, Abdominal exam, Extremity exam, Mental status exam WNL.  Exam Exam: Extent of exam reached: Terminal Ileum, extent intended: Cecum.  The cecum was identified by appendiceal orifice and IC valve. Patient position: on left side. Duration of exam: 20 minutes. Colon retroflexion performed. Images taken. ASA Classification: I. Tolerance: excellent.  Colon Prep Used Golytely for colon prep. Prep results: excellent.  Sedation Meds: Fentanyl 100 mcg. Versed 7 mg.  Instrument(s): PFC 160L. Serial [id number]. Serial #0.  Findings - NORMAL EXAM: Ileum to Descending Colon. Not Seen: Polyps. Colitis. Crohn's.  - DIVERTICULOSIS: Descending Colon to Sigmoid Colon. Not bleeding. ICD9: Diverticulosis, Colon: 562.10. Comments: Thickened hypertrophied muscular folds.  - HEMORRHOIDS: External. Size: Medium. Not bleeding. Not thrombosed. ICD9: Hemorrhoids, External: 455.3. Comments: Prominant skin tag noted.   Assessment  Diagnoses: 562.10: Diverticulosis, Colon.  455.3: Hemorrhoids, External.   Comments: Hx. of life-long laxative abuse and dependency!!!!  Will refer to Dr. Kendrick Ranch for hemorrhoidectomy per refractory hemorrhoidal bleeding skin  tags. Events  Unplanned Interventions: No intervention was required.  Plans Medication Plan: Continue current medications.  Patient  Education: Patient given standard instructions for: Hemorrhoids. Constipation. Disposition: After procedure patient sent to recovery. After recovery patient sent home.  Scheduling/Referral: Surgery, to Timothy E. Earlene Plater, MD, around Sep 19, 2000.    This report was created from the original endoscopy report, which was reviewed and signed by the above listed endoscopist.

## 2010-11-17 NOTE — Letter (Signed)
Summary: [facility]  [facility]   Imported By: Lanelle Bal 06/09/2010 14:19:30  _____________________________________________________________________  External Attachment:    Type:   Image     Comment:   External Document  Appended Document: [facility]     Clinical Lists Changes  Allergies: Removed allergy or adverse reaction of MACROBID Observations: Added new observation of PAST MED HX: GERD Hyperlipidemia Hypertension Hypothyroidism Osteoarthritis- hands Anemia-iron deficiency DM 2 diet controlled  edema  raynauds fibromyalgia  dry eyes  allergy visit 8/11 -- macroid challenge ok - not allergic  ortho- Dr Shon Baton   opthy- Dr Inez Pilgrim  (06/09/2010 21:23)       Past Medical History:    GERD    Hyperlipidemia    Hypertension    Hypothyroidism    Osteoarthritis- hands    Anemia-iron deficiency    DM 2 diet controlled     edema     raynauds    fibromyalgia     dry eyes     allergy visit 8/11 -- macroid challenge ok - not allergic        ortho- Dr Shon Baton         opthy- Dr Inez Pilgrim

## 2010-11-17 NOTE — Consult Note (Signed)
Summary: [facility]   Imported By: Lanelle Bal 04/07/2010 13:08:58  _____________________________________________________________________  External Attachment:    Type:   Image     Comment:   External Document

## 2010-11-17 NOTE — Letter (Signed)
Summary: Historic Patient File  Historic Patient File   Imported By: West Carbo 04/28/2010 09:55:23  _____________________________________________________________________  External Attachment:    Type:   Image     Comment:   External Document

## 2010-11-17 NOTE — Letter (Signed)
Summary: Historic Patient File  Historic Patient File   Imported By: West Carbo 04/28/2010 09:55:45  _____________________________________________________________________  External Attachment:    Type:   Image     Comment:   External Document

## 2010-11-17 NOTE — Assessment & Plan Note (Signed)
Summary: Gerd/dfs    History of Present Illness Visit Type: Initial Visit Primary GI MD: Sheryn Bison MD FACP FAGA Primary Provider: Roxy Manns, MD Chief Complaint: Patient states that she has been having the esophageal spasms for about 20 years. She has most recently been evaluated by her ENT Dr. Chestine Spore who suggested she have an EGD to evaluate for barretts. She states that he told her she has alot of acid build up on her vocal cords.   History of Present Illness:   Complex 60 year old white female with a history of fibromyalgia, degenerative arthritis of the spine, peripheral neuropathy,Raynaud's phenomenon of her feet, hypertension, thyroid function disorder, and multiple drug allergies. She presents today complaining of recurrent substernal chest pain radiating to her back she has had on off for 20 years, worse over the last several months. These are then diagnosed as" esophageal spasms" but she has not had previous endoscopy or manometry. She gives a history of chronic gastroesophageal reflux with associated hoarseness and coughing for many years having had ENT evaluations and being placed on twice a day Nexium. She continues to have regurgitation and burning but no dysphagia. She has regular bowel movements but was previously severely constipated while taking Celexa. She had colonoscopy 10 years ago that was unremarkable.  She denies rectal bleeding or melena or any hepatobiliary complaints, anorexia or weight loss. She is allergic to 10 different medications listed in her chart. She has recurrent migraine headaches in addition to her abrupt problems. Other problems are dry eyes syndrome, hyperlipidemia, chronic iron deficiency, and glucose intolerance. She has multiple orthopedic problems with her spine and is under the care of Dr. Shon Baton agreed for orthopedic. She also complains of recent right upper quadrant pain radiating to her right back area has not had previous diagnoses of gallbladder or  liver disease. Family history remarkable for emphysema and lung cancer, one sister with lymphoma, but no known colon cancer. She denies any specific food intolerances.   GI Review of Systems    Reports acid reflux, bloating, and  dysphagia with liquids.      Denies abdominal pain, belching, chest pain, dysphagia with solids, heartburn, loss of appetite, nausea, vomiting, vomiting blood, weight loss, and  weight gain.        Denies anal fissure, black tarry stools, change in bowel habit, constipation, diarrhea, diverticulosis, fecal incontinence, heme positive stool, hemorrhoids, irritable bowel syndrome, jaundice, light color stool, liver problems, rectal bleeding, and  rectal pain.    Current Medications (verified): 1)  Nexium 40 Mg Cpdr (Esomeprazole Magnesium) .... Take One By Mouth Two Times A Day 2)  Altace 10 Mg  Caps (Ramipril) .Marland Kitchen.. 1 By Mouth Once Daily 3)  Synthroid 125 Mcg  Tabs (Levothyroxine Sodium) .... Take One By Mouth Daily 4)  Fish Oil 1000 Mg Caps (Omega-3 Fatty Acids) .... Take One By Mouth Daily 5)  Iron 325 (65 Fe) Mg Tabs (Ferrous Sulfate) .... Take One By Mouth At Bedtime 6)  Maxalt-Mlt 10 Mg Tbdp (Rizatriptan Benzoate) .... Take One By Mouth As Needed As Directed For Migraine 7)  Valtrex 1 Gm Tabs (Valacyclovir Hcl) .... Take Two By Mouth Two Times A Day Prn 8)  Cyclobenzaprine Hcl 10 Mg Tabs (Cyclobenzaprine Hcl) .... Take 1/2 To 1 By Mouth Once Daily and 1 By Mouth At Bedtime 9)  Triamcinolone .1% .... Use On Aff Area Once Daily As Needed (Legs) 10)  Vitamin D 1000 Unit  Tabs (Cholecalciferol) .... Daily 11)  Wellbutrin Xl 150  Mg Xr24h-Tab (Bupropion Hcl) .Marland Kitchen.. 1 By Mouth Once Daily 12)  Hycodan .Marland Kitchen.. 1 Tsp Every 4-6hrs As Needed Cough 13)  Flonase 50 Mcg/act Susp (Fluticasone Propionate) .... Two Spray Each Nostril Daily As Directed 14)  Cardizem La 120 Mg Xr24h-Tab (Diltiazem Hcl Coated Beads) .... Take 1 Tablet By Mouth Once A Day 15)  Aleve 220 Mg Tabs (Naproxen  Sodium) .... One Tab Twice A Day 16)  Multivitamins   Tabs (Multiple Vitamin) .... One Daily 17)  Probiotic  Caps (Probiotic Product) .... Otc As Directed. 18)  Onetouch Test  Strp (Glucose Blood) .... Use As Directed 19)  Aspirin 81 Mg Tabs (Aspirin) .Marland Kitchen.. 1 By Mouth Once Daily 20)  Magnesium ?mg .... Take One Daily 21)  Zantac 300 Mg Tabs (Ranitidine Hcl) .... Take One By Mouth Once Daily  Allergies (verified): 1)  ! * Demadex 2)  ! * Aleve 3)  ! Elavil 4)  ! Effexor 5)  ! Paxil 6)  ! Prozac 7)  ! Lyrica 8)  ! Lipitor 9)  Penicillin 10)  Ceftin 11)  Lodine 12)  Tetracycline 13)  Erythromycin 14)  Sulfa 15)  * Zyrtec 16)  Cardizem 17)  Neurontin 18)  Lasix 19)  * Imapramine  Past History:  Past medical, surgical, family and social histories (including risk factors) reviewed for relevance to current acute and chronic problems.  Past Medical History: Reviewed history from 11/28/2009 and no changes required. GERD Hyperlipidemia Hypertension Hypothyroidism Osteoarthritis- hands Anemia-iron deficiency DM 2 diet controlled  edema  raynauds fibromyalgia  dry eyes    ortho- Dr Shon Baton   opthy- Dr Inez Pilgrim  Past Surgical History: MRI- thoracic, cervical-- slight disk herniations T6,7,8 with DJD  (11/1997) Cervical dysplasia, abnormal paps Left ovarian cysts x 3, rupture Dexa- mild osteopenia (08/2000)  Improved (09/2004) ABI's- normal (05/2005) Colonoscopy- diverticulosis, hemorrhoids (08/2000) Previous hand surgery stress cardiolite nl 10/08 with EF of 69% thumb joint repaired  Family History: Reviewed history from 03/29/2007 and no changes required. Father: deceased age 26- emphysema, smoker Mother: deceased age 64- ? lung cancer, smoker, CAD when relatively young Siblings: 2 brothers, 2 sisters one brother and one sister with lymphomia Family History of Diabetes: brother, sister Family History of Heart Disease: brother  Social History: Reviewed  history from 03/25/2008 and no changes required. 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  The patient denies anemia, anxiety-new, blood in urine, breast changes/lumps, change in vision, confusion, cough, coughing up blood, depression-new, fainting, fever, hearing problems, heart murmur, heart rhythm changes, itching, menstrual pain, muscle pains/cramps, nosebleeds, pregnancy symptoms, shortness of breath, skin rash, sleeping problems, sore throat, swelling of feet/legs, swollen lymph glands, thirst - excessive , urination - excessive , urination changes/pain, and vision changes.   General:  Complains of sweats, fatigue, and weakness; denies fever, chills, anorexia, malaise, weight loss, and sleep disorder. Eyes:  Denies blurring, diplopia, irritation, discharge, vision loss, scotoma, eye pain, and photophobia; dry eyes syndrome. ENT:  Complains of nasal congestion and hoarseness; denies earache, ear discharge, tinnitus, decreased hearing, loss of smell, nosebleeds, sore throat, and difficulty swallowing. CV:  Complains of chest pains; denies angina, palpitations, syncope, dyspnea on exertion, orthopnea, PND, peripheral edema, and claudication. Resp:  Denies  dyspnea at rest, dyspnea with exercise, cough, sputum, wheezing, coughing up blood, and pleurisy. GI:  Complains of constipation; denies difficulty swallowing, pain on swallowing, nausea, indigestion/heartburn, vomiting, vomiting blood, abdominal pain, jaundice, gas/bloating, diarrhea, change in bowel habits, bloody BM's, black BMs, and fecal incontinence. GU:  Complains of urinary incontinence; denies urinary burning, blood in urine, nocturnal urination,  urinary frequency, abnormal vaginal bleeding, amenorrhea, menorrhagia, vaginal discharge, pelvic pain, genital sores, painful intercourse, and decreased libido. MS:  Complains of joint swelling and joint stiffness; denies joint pain / LOM, joint deformity, low back pain, muscle weakness, muscle cramps, muscle atrophy, leg pain at night, leg pain with exertion, and shoulder pain / LOM hand / wrist pain (CTS); vasospastic painful condition of her feet and chronic degenerative disease of her cervical spine. She also stopped and chronic low back pain and a history of fibromyalgia.. Derm:  Denies rash, itching, dry skin, hives, moles, warts, and unhealing ulcers. Neuro:  Complains of frequent headaches; denies weakness, paralysis, abnormal sensation, seizures, syncope, tremors, vertigo, transient blindness, frequent falls, difficulty walking, headache, sciatica, radiculopathy other:, restless legs, memory loss, and confusion; she is on the care of her neurologist for her headaches and peripheral neuropathy.. Psych:  Complains of depression; denies anxiety, memory loss, suicidal ideation, hallucinations, paranoia, phobia, and confusion. Endo:  Complains of cold intolerance; denies heat intolerance, polydipsia, polyphagia, polyuria, unusual weight change, and hirsutism. Heme:  Denies bruising, bleeding, enlarged lymph nodes, and pagophagia. Allergy:  Complains of hay fever.  Vital Signs:  Patient profile:   60 year old female Height:      62 inches Weight:      202.8 pounds BMI:     37.23 Pulse rate:   78 / minute Pulse rhythm:   irregularly irregular BP sitting:   118 / 70  (left arm) Cuff size:   regular  Vitals Entered By: Harlow Mares CMA Duncan Dull) (December 11, 2009 11:00 AM)  Physical Exam  General:  Well developed, well nourished, no acute distress.healthy appearing.   Head:  Normocephalic and atraumatic. Eyes:  PERRLA, no icterus.exam deferred to patient's ophthalmologist.   Neck:  Supple;  no masses or thyromegaly. Lungs:  Clear throughout to auscultation.Scattered wheezes in the left chest area noted. Heart:  Regular rate and rhythm; no murmurs, rubs,  or bruits. Abdomen:  Soft, nontender and nondistended. No masses, hepatosplenomegaly or hernias noted. Normal bowel sounds. Msk:  Symmetrical with no gross deformities. Normal posture.She does have vascular congestion of her lower extremities and feet with very cold feet bilaterally but her hands are not cold or deformed or arthritic. Pulses:  Normal pulses noted. Extremities:  No clubbing, cyanosis, edema or deformities noted. Neurologic:  Alert and  oriented x4;  grossly normal neurologically. Cervical Nodes:  No significant cervical adenopathy. Inguinal Nodes:  No significant inguinal adenopathy. Psych:  Alert and cooperative. Normal mood and affect.   Impression & Recommendations:  Problem # 1:  CHEST PAIN (ICD-786.50) Assessment Deteriorated  Unusual chest pain despite heavy doses of PPIs for presumed acid reflux. She does have a history of Raynaud's phenomenon and may have severe acid reflux and secondary esophageal spasm. I would start her workup with endoscopy followed by probable manometry and 24-hour pH probe testing. She is to bring her lab data in for review. In the interim, she is to continue her reflux regime and b.i.d. PPI therapy. Upper abdominal ultrasound exam also has been scheduled to exclude cholelithiasis.  Orders: Ultrasound Abdomen (UAS) Colon/Endo (Colon/Endo)  Problem #  2:  ANEMIA-IRON DEFICIENCY (ICD-280.9) Assessment: Unchanged  Will review labs and also schedule followup colonoscopy exam since his been 10 years since her last exam.  Orders: Colon/Endo (Colon/Endo)  Problem # 3:  HYPERTENSION (ICD-401.9) Assessment: Improved Blood Pressure today is 118/70 and pulse was 70 and irregular.  Continue all of her blood pressure, thyroid, and lipid medicine as listed. She has multiple drug  allergies listed and reviewed her chart including most antibiotics.  Problem # 4:  SOMATIC DYSFUNCTION (ICD-739.9) Assessment: Unchanged Long history of anxiety and depression and multiple functional disorders. Patient also sees multiple physicians has recently seen Dr. Bosie Clos at [facility]. I do not have his notes for review. Patient also takes p.r.n. NSAIDs. Probiotics, multivitamins, and p.r.n. hydrocodone. She has multiple ENT and orthopedic symptoms which are treated with symptomatic p.r.n. meds.  Patient Instructions: 1)  Avoid foods high in acid content ( tomatoes, citrus juices, spicy foods) . Avoid eating within 3 to 4 hours of lying down or before exercising. Do not over eat; try smaller more frequent meals. Elevate head of bed four inches when sleeping.  2)  Colonoscopy and Flexible Sigmoidoscopy brochure given.  3)  Conscious Sedation brochure given.  4)  Upper Endoscopy brochure given.  5)  Copy sent to : Dr.Marne Tower 6)  The medication list was reviewed and reconciled.  All changed / newly prescribed medications were explained.  A complete medication list was provided to the patient / caregiver. Prescriptions: LEVSIN/SL 0.125 MG SUBL (HYOSCYAMINE SULFATE) 1 sl q 4-6- hrs as needed  #40 x 3   Entered by:   Ashok Cordia RN   Authorized by:   Mardella Layman MD [facility]   Signed by:   Ashok Cordia RN on 12/11/2009   Method used:   Electronically to        [facility] [phone]* (retail)       [address] [place], [place]  [address]       Ph: [phone] or [phone]       Fax: [phone]   RxID:   [id number] MOVIPREP 100 GM  SOLR (PEG-KCL-NACL-NASULF-NA ASC-C) As per prep instructions.  #1 x 0   Entered by:   Ashok Cordia RN   Authorized by:   Mardella Layman MD [facility]   Signed by:   Ashok Cordia RN on 12/11/2009   Method used:   Electronically to        [facility] [phone][facility] (retail)       [address] [place], [place]  [address]       Ph: [phone] or [phone]       Fax: [phone]   RxID:   [id number]

## 2010-11-17 NOTE — Assessment & Plan Note (Signed)
Summary: uti   Vital Signs:  Patient profile:   60 year old female Height:      62 inches Weight:      201.75 pounds BMI:     37.03 Temp:     98.7 degrees F oral Pulse rate:   84 / minute Pulse rhythm:   regular BP sitting:   104 / 70  (left arm) Cuff size:   large  Vitals Entered By: Lewanda Rife LPN (March 19, 4539 9:21 AM) CC: UTI low back pain and not feel well   History of Present Illness: 3 weeks ago - got up with uti-- cramping and urine burning  saw PA and was put on cipro and got a cx  was resistant to the cipro  she also may have had a rash from it (was also in the sun)   then put on macrobid  then got a severe rash - and stopped taking that  [facility]  drank cranberry juice  cx returned that was positive   is sens to gentamycin all to pcn -- had anaphylaxis  sulfa gave her a rash in the past   is interested in testing for abx allergies   now is not as symptomatic  feels like she is generally sick -- some abd soreness  some low back pain  no more burning on urination   Allergies: 1)  ! * Demadex 2)  ! * Aleve 3)  ! Elavil 4)  ! Effexor 5)  ! Paxil 6)  ! Prozac 7)  ! Lyrica 8)  ! Lipitor 9)  ! Cipro 10)  ! Macrobid 11)  ! Cipro 12)  ! Macrobid 13)  Penicillin 14)  Ceftin 15)  Lodine 16)  Tetracycline 17)  Erythromycin 18)  Sulfa 19)  * Zyrtec 20)  Cardizem 21)  Neurontin 22)  Lasix 23)  * Imapramine  Past History:  Past Medical History: Last updated: 11/28/2009 GERD Hyperlipidemia Hypertension Hypothyroidism Osteoarthritis- hands Anemia-iron deficiency DM 2 diet controlled  edema  raynauds fibromyalgia  dry eyes    ortho- Dr Shon Baton   opthy- Dr Inez Pilgrim  Past Surgical History: Last updated: 2009-12-26 MRI- thoracic, cervical-- slight disk herniations T6,7,8 with DJD  (11/1997) Cervical dysplasia, abnormal paps Left ovarian cysts x 3, rupture Dexa- mild osteopenia (08/2000)   Improved (09/2004) ABI's- normal (05/2005) Colonoscopy- diverticulosis, hemorrhoids (08/2000) Previous hand surgery stress cardiolite nl 10/08 with EF of 69% thumb joint repaired  Family History: Last updated: 2009-12-26 Father: deceased age 71- emphysema, smoker Mother: deceased age 71- ? 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 Eyes:  Denies blurring and discharge. ENT:  Denies sore throat. CV:  Denies chest pain or discomfort. Resp:  Denies cough, shortness of breath, and wheezing. GI:  Denies abdominal pain, bloody stools, change in bowel habits, nausea, and vomiting. GU:  Complains of urinary frequency; denies discharge, dysuria, and hematuria. MS:  Complains of low back pain. Derm:  Denies itching, lesion(s), poor wound healing, and  rash. Endo:  Denies cold intolerance, excessive thirst, excessive urination, and heat intolerance. Heme:  Denies abnormal bruising and bleeding.  Physical Exam  General:  overweight but generally well appearing  Head:  normocephalic, atraumatic, and no abnormalities observed.   Mouth:  pharynx pink and moist.   Neck:  No deformities, masses, or tenderness noted. Lungs:  Normal respiratory effort, chest expands symmetrically. Lungs are clear to auscultation, no crackles or wheezes. Heart:  Normal rate and regular rhythm. S1 and S2 normal without gallop, murmur, click, rub or other extra sounds. Abdomen:  no suprapubic  tenderness or fullness felt  Msk:  no CVA tenderness  Extremities:  trace left pedal edema and trace right pedal edema.  hyperemia with warm feet but cold toes baseline   Neurologic:  gait normal and DTRs symmetrical and normal.   Skin:  Intact without suspicious lesions or rashes Cervical Nodes:  No lymphadenopathy noted Inguinal Nodes:  No significant adenopathy Psych:  normal affect, talkative and pleasant   Diabetes Management Exam:    Foot Exam (with socks and/or shoes not present):       Sensory-Pinprick/Light touch:          Left medial foot (L-4): normal          Left dorsal foot (L-5): normal          Left lateral foot (S-1): normal          Right medial foot (L-4): normal          Right dorsal foot (L-5): normal          Right lateral foot (S-1): normal       Sensory-Monofilament:          Left foot: normal          Right foot: normal       Inspection:          Left foot: normal          Right foot: normal       Nails:          Left foot: normal          Right foot: normal   Impression & Recommendations:  Problem # 1:  UTI (ICD-599.0) Assessment New this is multi drug resistant and pt is allergic to many options (incl anaphalaxis with pcn and cepahalosporin and rash with macrobid and sulfa)  will ref to urol for help with this  symptoms are imp with 1 dose of macrobid  enc fluids and update asap if worse or fever/ etc  Orders: Urology Referral (Urology) UA Dipstick W/ Micro (manual) ([address])  Problem # 2:  PERSONAL HISTORY ALLERGY UNSPEC MEDICINAL AGENT (ICD-V14.9) Assessment: New many abx allergies  ref to allergist for some formal testing  Orders: Allergy Referral  (Allergy)  Complete Medication List: 1)  Nexium 40 Mg Cpdr (Esomeprazole magnesium) .... Take one by mouth two times a day 2)  Altace 10 Mg Caps (Ramipril) .Marland Kitchen.. 1 by mouth once daily 3)  Synthroid 125 Mcg Tabs (Levothyroxine sodium) .... Take one by mouth daily 4)  Fish Oil 1000 Mg Caps  (Omega-3 fatty acids) .... Take one by mouth daily 5)  Iron 325 (65 Fe) Mg Tabs (Ferrous sulfate) .... Take one by mouth at bedtime 6)  Maxalt-mlt 10 Mg Tbdp (Rizatriptan benzoate) .... Take one by mouth as needed as directed for migraine 7)  Valtrex 1 Gm Tabs (Valacyclovir hcl) .... Take two by mouth two times a day prn  8)  Cyclobenzaprine Hcl 10 Mg Tabs (Cyclobenzaprine hcl) .... Take 1/2 to 1 by mouth once daily and 1 by mouth at bedtime 9)  Triamcinolone .1%  .... Use on aff area once daily as needed (legs) 10)  Vitamin D 1000 Unit Tabs (Cholecalciferol) .... Daily 11)  Hycodan  .Marland Kitchen.. 1 tsp every 4-6hrs as needed cough 12)  Flonase 50 Mcg/act Susp (Fluticasone propionate) .... Two spray each nostril daily as directed as needed 13)  Cardizem La 120 Mg Xr24h-tab (Diltiazem hcl coated beads) .... Take 1 tablet by mouth once a day 14)  Aleve 220 Mg Tabs (Naproxen sodium) .... One tab twice a day 15)  Multivitamins Tabs (Multiple vitamin) .... One daily 16)  Probiotic Caps (Probiotic product) .... Otc as directed. 17)  Onetouch Test Strp (Glucose blood) .... Use as directed 18)  Aspirin 81 Mg Tabs (Aspirin) .Marland Kitchen.. 1 by mouth once daily 19)  Magnesium ?mg  .... Take one daily 20)  Zantac 300 Mg Tabs (Ranitidine hcl) .... Take one by mouth once daily 21)  Levsin/sl 0.125 Mg Subl (Hyoscyamine sulfate) .Marland Kitchen.. 1 sl q 4-6- hrs as needed 22)  Cheratussin Ac 100-10 Mg/61ml Syrp (Guaifenesin-codeine) .... 5 ml two times a day as needed cough  Patient Instructions: 1)  we will do ref to urology and allergist at check out  2)  keep drinking lots of water and cranberry juice  3)  update me if symptoms worsen   Current Allergies (reviewed today): ! * DEMADEX ! * ALEVE ! ELAVIL ! [facility] ! PAXIL ! PROZAC ! LYRICA ! LIPITOR ! CIPRO ! MACROBID ! CIPRO ! MACROBID PENICILLIN CEFTIN LODINE TETRACYCLINE ERYTHROMYCIN SULFA * ZYRTEC [facility] Nordmann [facility]  Laboratory Results     Urine Tests  Date/Time Received: March 18, 2010 9:24 AM  Date/Time Reported: March 18, 2010 9:24 AM   Routine Urinalysis   Color: yellow Appearance: slight haziness Glucose: negative   (Normal Range: Negative) Bilirubin: negative   (Normal Range: Negative) Ketone: negative   (Normal Range: Negative) Spec. Gravity: 1.010   (Normal Range: 1.003-1.035) Blood: trace-lysed   (Normal Range: Negative) pH: 6.5   (Normal Range: 5.0-8.0) Protein: trace   (Normal Range: Negative) Urobilinogen: 0.2   (Normal Range: 0-1) Nitrite: negative   (Normal Range: Negative) Leukocyte Esterace: negative   (Normal Range: Negative)  Urine Microscopic WBC/HPF: 1-3 RBC/HPF: 1-2 Bacteria/HPF: mild Mucous/HPF: few Epithelial/HPF: 2-4 Crystals/HPF: few Casts/LPF: 0 Yeast/HPF: 0 Other: 0

## 2010-11-17 NOTE — Letter (Signed)
Summary: External Correspondence  External Correspondence   Imported By: [place] 04/22/2010 14:38:59  _____________________________________________________________________  External Attachment:    Type:   Image     Comment:   External Document

## 2010-11-17 NOTE — Letter (Signed)
Summary: [facility] Instructions  Tina Berger  [address] [place], [place] [address]   Phone: [phone]  Fax: [phone]       Tina Berger    [birth year]    MRN: [id number]        Procedure Day /Date: Friday, 12/19/09     Arrival Time: 2:00      Procedure Time: 3:00     Location of Procedure:                    Juliann Pares  [facility] (4th Floor)                        PREPARATION FOR COLONOSCOPY WITH MOVIPREP   Starting 5 days prior to your procedure 12/13/09 do not eat nuts, seeds, popcorn, corn, beans, peas,  salads, or any raw vegetables.  Do not take any fiber supplements (e.g. Metamucil, Citrucel, and Benefiber).  THE DAY BEFORE YOUR PROCEDURE         DATE: 12/18/09  DAY: Thursday  1.  Drink clear liquids the entire day-NO SOLID FOOD  2.  Do not drink anything colored red or purple.  Avoid juices with pulp.  No orange juice.  3.  Drink at least 64 oz. (8 glasses) of fluid/clear liquids during the day to prevent dehydration and help the prep work efficiently.  CLEAR LIQUIDS INCLUDE: Water Jello Ice Popsicles Tea (sugar ok, no milk/cream) Powdered fruit flavored drinks Coffee (sugar ok, no milk/cream) Gatorade Juice: apple, white grape, white cranberry  Lemonade Clear bullion, consomm, broth Carbonated beverages (any kind) Strained chicken noodle soup Hard Candy                             4.  In the morning, mix first dose of MoviPrep solution:    Empty 1 Pouch A and 1 Pouch B into the disposable container    Add lukewarm drinking water to the top line of the container. Mix to dissolve    Refrigerate (mixed solution should be used within 24 hrs)  5.  Begin drinking the prep at 5:00 p.m. The MoviPrep container is divided by 4 marks.   Every 15 minutes drink the solution down to the next mark (approximately 8 oz) until the full liter is complete.   6.  Follow completed prep with 16 oz of clear liquid of your choice (Nothing red or  purple).  Continue to drink clear liquids until bedtime.  7.  Before going to bed, mix second dose of MoviPrep solution:    Empty 1 Pouch A and 1 Pouch B into the disposable container    Add lukewarm drinking water to the top line of the container. Mix to dissolve    Refrigerate  THE DAY OF YOUR PROCEDURE      DATE: 12/19/09  DAY: Friday  Beginning at 10:00 a.m. (5 hours before procedure):         1. Every 15 minutes, drink the solution down to the next mark (approx 8 oz) until the full liter is complete.  2. Follow completed prep with 16 oz. of clear liquid of your choice.    3. You may drink clear liquids until 1:00 (2 HOURS BEFORE PROCEDURE).   MEDICATION INSTRUCTIONS  Unless otherwise instructed, you should take regular prescription medications with a small sip of water   as early as possible the morning of your  procedure.                  OTHER INSTRUCTIONS  You will need a responsible adult at least 61 years of age to accompany you and drive you home.   This person must remain in the waiting room during your procedure.  Wear loose fitting clothing that is easily removed.  Leave jewelry and other valuables at home.  However, you may wish to bring a book to read or  an iPod/MP3 player to listen to music as you wait for your procedure to start.  Remove all body piercing jewelry and leave at home.  Total time from sign-in until discharge is approximately 2-3 hours.  You should go home directly after your procedure and rest.  You can resume normal activities the  day after your procedure.  The day of your procedure you should not:   Drive   Make legal decisions   Operate machinery   Drink alcohol   Return to work  You will receive specific instructions about eating, activities and medications before you leave.    The above instructions have been reviewed and explained to me by   _______________________    I fully understand and can verbalize  these instructions _____________________________ Date _________

## 2010-11-17 NOTE — Assessment & Plan Note (Signed)
Summary: Rapid Hear Beats  Medications Added VERAMYST 27.5 MCG/SPRAY SUSP (FLUTICASONE FUROATE) as directed BENADRYL 25 MG TABS (DIPHENHYDRAMINE HCL) as needed METOPROLOL TARTRATE 25 MG TABS (METOPROLOL TARTRATE) Take one tablet by mouth twice a day      Allergies Added:   Visit Type:  Initial Consult Primary Provider:  Roxy Manns, MD  CC:  c/o irreg. heart beat, swelling in legs, and feet and ankles..  History of Present Illness: Tina Berger is a 60 year old woman with a history of Raynaud's disease, hyperlipidemia, HTN, obesity, chronic lower extremity swelling who presents for evaluation of irregular heartbeats.  She has been told before that she has ectopy. she works a long day, working from 7 in the morning to 7 at nighttime, also taking care of a 51-year-old grandson. She has had a history of chest pain with a workup in the past including echocardiogram and stress test will this was essentially negative. She denies any recent chest pain. she has her palpitations at nighttime when she sits down on the couch around 7:30 to 8:00 PM. She denies any palpitations generally daytime.  she reports having 8 elevated baseline heart rate since she was young. She tried propranolol and she was younger and she believes that this may have contributed to her Raynaud's. She has not tried any additional beta blockers.  Echocardiogram in October 2008 was essentially normal.  Stress test in October 2008 showed no ischemia. This is a exercise treadmill. She exercised for 4 minutes 30 seconds, peak heart rate of 152 beats per minute, peak blood pressure 172/94.  EKG shows normal sinus rhythm with a rate of 91 beats per minute, no significant ST or T wave changes.  Current Medications (verified): 1)  Nexium 40 Mg Cpdr (Esomeprazole Magnesium) .... Take One By Mouth Two Times A Day 2)  Altace 10 Mg  Caps (Ramipril) .Marland Kitchen.. 1 By Mouth Once Daily 3)  Synthroid 125 Mcg  Tabs (Levothyroxine Sodium) .... Take One  By Mouth Daily 4)  Fish Oil 1000 Mg Caps (Omega-3 Fatty Acids) .... Take One By Mouth Daily 5)  Iron 325 (65 Fe) Mg Tabs (Ferrous Sulfate) .... Take One By Mouth At Bedtime 6)  Maxalt-Mlt 10 Mg Tbdp (Rizatriptan Benzoate) .... Take One By Mouth As Needed As Directed For Migraine 7)  Valtrex 1 Gm Tabs (Valacyclovir Hcl) .... Take Two By Mouth Two Times A Day Prn 8)  Cyclobenzaprine Hcl 10 Mg Tabs (Cyclobenzaprine Hcl) .... Take 1/2 To 1 By Mouth Once Daily and 1 By Mouth At Bedtime 9)  Triamcinolone .1% .... Use On Aff Area Once Daily As Needed (Legs) 10)  Hycodan .Marland Kitchen.. 1 Tsp Every 4-6hrs As Needed Cough 11)  Cardizem La 120 Mg Xr24h-Tab (Diltiazem Hcl Coated Beads) .... Take 1 Tablet By Mouth Once A Day 12)  Aleve 220 Mg Tabs (Naproxen Sodium) .... One Tab Twice A Day 13)  Multivitamins   Tabs (Multiple Vitamin) .... One Daily 14)  Probiotic  Caps (Probiotic Product) .... Otc As Directed. 15)  Onetouch Test  Strp (Glucose Blood) .... Use As Directed 16)  Zantac 300 Mg Tabs (Ranitidine Hcl) .... Take One By Mouth Once Daily 17)  Levsin/sl 0.125 Mg Subl (Hyoscyamine Sulfate) .Marland Kitchen.. 1 Sl Q 4-6- Hrs As Needed 18)  Cheratussin Ac 100-10 Mg/80ml Syrp (Guaifenesin-Codeine) .... 5 Ml Two Times A Day As Needed Cough 19)  Veramyst 27.5 Mcg/spray Susp (Fluticasone Furoate) .... As Directed 20)  Benadryl 25 Mg Tabs (Diphenhydramine Hcl) .... As Needed  Allergies (verified): 1)  ! * Demadex 2)  ! * Aleve 3)  ! Elavil 4)  ! Effexor 5)  ! Paxil 6)  ! Prozac 7)  ! Lyrica 8)  ! Lipitor 9)  ! Cipro 10)  ! Macrobid 11)  ! Cipro 12)  ! Macrobid 13)  Penicillin 14)  Ceftin 15)  Lodine 16)  Tetracycline 17)  Erythromycin 18)  Sulfa 19)  * Zyrtec 20)  Cardizem 21)  Neurontin 22)  Lasix 23)  * Imapramine  Past History:  Past Medical History: Last updated: 11/28/2009 GERD Hyperlipidemia Hypertension Hypothyroidism Osteoarthritis- hands Anemia-iron deficiency DM 2 diet controlled  edema    raynauds fibromyalgia  dry eyes    ortho- Dr Shon Baton   opthy- Dr Inez Pilgrim  Past Surgical History: Last updated: 2010-01-07 MRI- thoracic, cervical-- slight disk herniations T6,7,8 with DJD  (11/1997) Cervical dysplasia, abnormal paps Left ovarian cysts x 3, rupture Dexa- mild osteopenia (08/2000)  Improved (09/2004) ABI's- normal (05/2005) Colonoscopy- diverticulosis, hemorrhoids (08/2000) Previous hand surgery stress cardiolite nl 10/08 with EF of 69% thumb joint repaired  Family History: Last updated: January 07, 2010 Father: deceased age 15- emphysema, smoker Mother: deceased age 87- ? 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  The patient denies fever, weight loss, vision loss, decreased hearing, hoarseness, chest pain, syncope, dyspnea on exertion, prolonged cough, abdominal pain, incontinence, muscle weakness, depression, and enlarged lymph nodes.         palpitations in the evening  Vital Signs:  Patient profile:   60 year old female Height:      62 inches Weight:      204 pounds BMI:     37.45 Pulse rate:   103 / minute BP sitting:   131 / 89  (right arm) Cuff size:   large  Vitals Entered By: Bishop Dublin, CMA (April 27, 2010 3:59 PM)  Physical Exam  General:  Well developed, well nourished, in no acute distress. Head:  normocephalic and atraumatic Neck:  Neck supple, no JVD. No masses, thyromegaly or abnormal cervical nodes. Lungs:  Clear bilaterally to  auscultation and percussion. Heart:  Non-displaced PMI, chest non-tender; regular rate and rhythm, S1, S2 without murmurs, rubs or gallops. Carotid upstroke normal, no bruit.  Pedals normal pulses. No edema, no varicosities. Abdomen:  Bowel sounds positive; abdomen soft and non-tender without masses Msk:  Back normal, normal gait. Muscle strength and tone normal. Pulses:  pulses normal in all 4 extremities Extremities:  No clubbing. toes and distal part of her feet appear mottled. Neurologic:  Alert and oriented x 3. Skin:  Intact without lesions or rashes. Psych:  Normal affect.   Impression & Recommendations:  Problem # 1:  PALPITATIONS (ICD-785.1) palpitations are likely due to ectopy as they are described as a flip-flop, are rare and did not persists. They come on at night time at the end of a busy day. We will give her a prescription for metoprolol tartrate 25 mg to be taken p.r.n. b.i.d. for her symptoms. She can try  a half dose b.i.d. or a dose in the evening if she has no symptoms turned the daytime.  The following medications were removed from the medication list:    Aspirin 81 Mg Tabs (Aspirin) .Marland Kitchen... 1 by mouth once daily Her updated medication list for this problem includes:    Altace 10 Mg Caps (Ramipril) .Marland Kitchen... 1 by mouth once daily    Cardizem La 120 Mg Xr24h-tab (Diltiazem hcl coated beads) .Marland Kitchen... Take 1 tablet by mouth once a day    Metoprolol Tartrate 25 Mg Tabs (Metoprolol tartrate) .Marland Kitchen... Take one tablet by mouth twice a day  Problem # 2:  HYPERLIPIDEMIA (ICD-272.4) she reports a history of hyperlipidemia. She is due to have her lipids reevaluated. We will look at these when she has them repeated at work. She had problems previously on Lipitor. We could try low-dose Crestor 5 mg every other day or 2.5 mg daily advancing as tolerated  Problem # 3:  HEALTH MAINTENANCE EXAM (ICD-V70.0) we encouraged her to continue her exercise during the daytime and an effort to lose  weight. This will also help with her cholesterol an HTN.  Patient Instructions: 1)  Your physician recommends that you schedule a follow-up appointment in: as needed  2)  Your physician has recommended you make the following change in your medication: START metoprolol 25mg  two times a day  Prescriptions: METOPROLOL TARTRATE 25 MG TABS (METOPROLOL TARTRATE) Take one tablet by mouth twice a day  #60 x 6   Entered by:   Benedict Needy, RN   Authorized by:   Dossie Arbour MD   Signed by:   Benedict Needy, RN on 04/27/2010   Method used:   Electronically to        [facility]ail[facility]       [address]       [place], [place]  [address]       Ph: [phone]       Fax: [phone]   RxID:   [id number]   Appended Document: Rapid Hear Beats she does have a family history of coronary artery disease. Her brother had bypass surgery. Also her father had a heart attack.  After evaluation of her lipids, total cholesterol 224, LDL 141 several months ago. She may benefit from starting a low dose Crestor. She's indicates she would like to have her labs rechecked at work before starting.

## 2010-11-17 NOTE — Letter (Signed)
Summary: New Patient letter  [facility]  [address] [place], [place] [address]   Phone: [phone]  Fax: [phone]       12/02/2009 MRN: [id number]  [facility] [address] [place], [place]  [address]  Dear [name],  Welcome to the Gastroenterology Division at [facility].    You are scheduled to see Dr.  Jarold Motto on 12-25-09 at 9:30a.m. on the 3rd floor at [facility], [address]  We ask that you try to arrive at our office 15 minutes prior to your appointment time to allow for check-in.  We would like you to complete the enclosed self-administered evaluation form prior to your visit and bring it with you on the day of your appointment.  We will review it with you.  Also, please bring a complete list of all your medications or, if you prefer, bring the medication bottles and we will list them.  Please bring your insurance card so that we may make a copy of it.  If your insurance requires a referral to see a specialist, please bring your referral form from your primary care physician.  Co-payments are due at the time of your visit and may be paid by cash, check or credit card.     Your office visit will consist of a consult with your physician (includes a physical exam), any laboratory testing he/she may order, scheduling of any necessary diagnostic testing (e.g. x-ray, ultrasound, CT-scan), and scheduling of a procedure (e.g. Endoscopy, Colonoscopy) if required.  Please allow enough time on your schedule to allow for any/all of these possibilities.    If you cannot keep your appointment, please call [phone] to cancel or reschedule prior to your appointment date.  This allows [place] the opportunity to schedule an appointment for another patient in need of care.  If you do not cancel or reschedule by 5 p.m. the business day prior to your appointment date, you will be charged a $50.00 late cancellation/no-show fee.    Thank you for choosing  [facility] for your medical needs.  We appreciate the opportunity to care for you.  Please visit [place] at our website  to learn more about our practice.                     Sincerely,                                                             The Gastroenterology Division

## 2010-11-17 NOTE — Letter (Signed)
Summary: Patient Notice-Endo Biopsy Results  [facility]  [address] [place], [place] [address]   Phone: [phone]  Fax: [phone]        January 05, 2010 MRN: [id number]    [facility] [address] [place], [place]  [address]    Dear Ms. HOFFMASTER,  I am pleased to inform you that the biopsies taken during your recent endoscopic examination did not show any evidence of cancer upon pathologic examination.  Additional information/recommendations:  __No further action is needed at this time.  Please follow-up with      your primary care physician for your other healthcare needs.  __ Please call [phone] to schedule a return visit to review      your condition.  _xx_ Continue with the treatment plan as outlined on the day of your      exam.  __ You should have a repeat endoscopic examination for this problem              in _ months/years.   Please call us if you are having persistent problems or have questions about your condition that have not been fully answered at this time.  Sincerely,  Mardella Layman MD [facility]  This letter has been electronically signed by your physician.  Appended Document: Patient Notice-Endo Biopsy Results Letter mailed 3.22.11

## 2010-11-17 NOTE — Progress Notes (Signed)
Summary: Sooner Appt.   Phone Note Call from Patient Call back at Home Phone [phone]   Caller: Patient Call For: Dr. Jarold Motto Reason for Call: Talk to Nurse Summary of Call: Pt is scheduled for 12-25-09 and saw her ENT today and was told she needed to see GI ASAP for acid reflux and "damage to her vocal cords due to the acid reflux." Wants to see if she can be seen sooner. Initial call taken by: Karna Christmas,  December 02, 2009 4:15 PM  Follow-up for Phone Call        LM for pt to call. Lupita Leash Surface RN  December 03, 2009 9:37 AM  Appt scheduled for 12/11/09.  Advised of cancellation fee. Follow-up by: Ashok Cordia RN,  December 03, 2009 9:42 AM

## 2010-11-17 NOTE — Letter (Signed)
Summary: [facility] Immunology  [facility] Rheumat[facility] Immunology   Imported By: Lanelle Bal 05/06/2010 13:33:44  _____________________________________________________________________  External Attachment:    Type:   Image     Comment:   External Document

## 2010-11-17 NOTE — Progress Notes (Signed)
Summary: Maxalt MLT 10mg  and Nexium 40mg   Phone Note Refill Request Call back at [phone] Message from:  [facility] Mail order on September 22, 2010 1:11 PM  Refills Requested: Medication #1:  MAXALT-MLT 10 MG TBDP take one by mouth as needed as directed for migraine  Medication #2:  NEXIUM 40 MG CPDR take one by mouth two times a day [facility] Mail order electronically request refills on Maxalt and Nexium. No last refill date sent. Pt cancelled lab appt 02/2010. Pt was seen by Dr Milinda Antis for UTI 03/18/10.Please advise.    Method Requested: Electronic Initial call taken by: Lewanda Rife LPN,  September 22, 2010 1:12 PM  Follow-up for Phone Call        please schedule f/u with me in feb - thanks  Follow-up by: Judith Part MD,  September 22, 2010 8:01 PM  Additional Follow-up for Phone Call Additional follow up Details #1::        Patient notified as instructed by telephone. Pt scheduled appt with Dr Milinda Antis on 12/02/09 at 8:30am.Medications faxed to [facility] as instructed. Lewanda Rife LPN  September 23, 2010 9:04 AM     New/Updated Medications: NEXIUM 40 MG CPDR (ESOMEPRAZOLE MAGNESIUM) take one by mouth two times a day MAXALT-MLT 10 MG TBDP (RIZATRIPTAN BENZOATE) take one by mouth as needed as directed for migraine Prescriptions: NEXIUM 40 MG CPDR (ESOMEPRAZOLE MAGNESIUM) take one by mouth two times a day  #180 x 3   Entered by:   Lewanda Rife LPN   Authorized by:   Judith Part MD   Signed by:   Lewanda Rife LPN on 55/73/2202   Method used:   Faxed to ...       [facility] (mail-order)             , [place]         Ph: [phone]       Fax: [phone]   RxID:   [id number] MAXALT-MLT 10 MG TBDP (RIZATRIPTAN BENZOATE) take one by mouth as needed as directed for migraine  #27 x 3   Entered by:   Lewanda Rife LPN   Authorized by:   Judith Part MD   Signed by:   Lewanda Rife LPN on 70/35/0093   Method used:   Faxed to ...       [facility] (mail-order)             , [place]         Ph: [phone]      Fax: [phone]   RxID:   [id number]

## 2010-11-17 NOTE — Assessment & Plan Note (Signed)
Summary: UTI/JBB   Vital Signs:  Patient Profile:   60 Years Old Female CC:      cold sore in nostril Height:     62 inches Weight:      203 pounds Temp:     98.6 degrees F oral Pulse rate:   88 / minute Pulse rhythm:   regular Resp:     20 per minute BP sitting:   128 / 76  (right arm) Cuff size:   large  Pt. in pain?   yes    Location:   nose    Intensity:   2    Type:       stinging  Vitals Entered By: Providence Crosby LPN (June 13, 2010 4:48 PM)                   Prior Medications Reviewed Using: Electronic Medical Record and Patient Recall  Current Allergies: ! * [facility] ! * ALEVE ! ELAVIL ! [facility] ! PAXIL ! PROZAC ! LYRICA ! LIPITOR ! CIPRO ! CIPRO ! MACROBID ! * MOLDE AND MILDEW PENICILLIN CEFTIN LODINE TETRACYCLINE ERYTHROMYCIN SULFA * ZYRTEC CARDIZEM NEURONTIN LASIX * IMAPRAMINEHistory of Present Illness History from: patient Reason for visit: ? COLD SORE IN NOSE Chief Complaint: cold sore in nostril History of Present Illness: This patient presented today because she is having a recurrent bout of herpes in the nose.  She has been using Valtrex for many years to treat the outbreaks but repStated symptoms started this amorts that her prescription has expired because she hasn't used it in years.  Pt reports that she has the usual tingling, burning sensation in the right upper frontal nares from the cold sore that is present there.  She reports that she normally experiences an acute worsening of the lesions inside the nares if she does not treat it with Valtrex.  The patient denies fever, chills, nausea, vomiting and shortness of breath.    Current Problems: HERPES SIMPLEX WITHOUT MENTION OF COMPLICATION (ICD-054.9) PALPITATIONS (ICD-785.1) PERSONAL HISTORY ALLERGY UNSPEC MEDICINAL AGENT (ICD-V14.9) UTI (ICD-599.0) SOMATIC DYSFUNCTION (ICD-739.9) DEPRESSION (ICD-311) DIABETES MELLITUS, TYPE II (ICD-250.00) UNSPECIFIED VITAMIN D DEFICIENCY  (ICD-268.9) OSTEOPENIA (ICD-733.90) ANXIETY (ICD-300.00) ROUTINE GYNECOLOGICAL EXAMINATION (ICD-V72.31) HEALTH MAINTENANCE EXAM (ICD-V70.0) OTHER SCREENING MAMMOGRAM (ICD-V76.12) SYMPTOM, EDEMA (ICD-782.3) ANEMIA-IRON DEFICIENCY (ICD-280.9) OSTEOARTHRITIS (ICD-715.90) Hx of KERATOCONJUNCTIVITIS SICCA (ICD-370.33) Hx of ROSACEA (ICD-695.3) Hx of ABNORMAL HEART RHYTHMS (ICD-427.9) Hx of IBS (ICD-564.1) Hx of PANIC ATTACK (ICD-300.01) Hosp for ESOPHAGITIS (ICD-530.10) Hx of GASTRITIS (ICD-535.50) MIGRAINES, HX OF (ICD-V13.8) Hx of MITRAL VALVE PROLAPSE (ICD-424.0) Hx of BACK PAIN (ICD-724.5) Hx of PLANTAR FASCIITIS (ICD-728.71) Hx of RAYNAUD'S SYNDROME (ICD-443.0) Hx of FIBROMYALGIA (ICD-729.1) HYPOTHYROIDISM (ICD-244.9) HYPERTENSION (ICD-401.9) HYPERLIPIDEMIA (ICD-272.4) GERD (ICD-530.81)   Current Meds NEXIUM 40 MG CPDR (ESOMEPRAZOLE MAGNESIUM) take one by mouth two times a day ALTACE 10 MG  CAPS (RAMIPRIL) 1 by mouth once daily SYNTHROID 125 MCG  TABS (LEVOTHYROXINE SODIUM) take one by mouth daily FISH OIL 1000 MG CAPS (OMEGA-3 FATTY ACIDS) take one by mouth daily IRON 325 (65 FE) MG TABS (FERROUS SULFATE) take one by mouth at bedtime MAXALT-MLT 10 MG TBDP (RIZATRIPTAN BENZOATE) take one by mouth as needed as directed for migraine VALTREX 1 GM TABS (VALACYCLOVIR HCL) take two by mouth two times a day prn CYCLOBENZAPRINE HCL 10 MG TABS (CYCLOBENZAPRINE HCL) take 1/2 to 1 by mouth once daily and 1 by mouth at bedtime * TRIAMCINOLONE .1% use on aff area once daily as needed (legs) * HYCODAN 1  tsp every 4-6hrs as needed cough CARDIZEM LA 120 MG XR24H-TAB (DILTIAZEM HCL COATED BEADS) Take 1 tablet by mouth once a day ALEVE 220 MG TABS (NAPROXEN SODIUM) one tab twice a day MULTIVITAMINS   TABS (MULTIPLE VITAMIN) one daily PROBIOTIC  CAPS (PROBIOTIC PRODUCT) OTC As directed. ONETOUCH TEST  STRP (GLUCOSE BLOOD) use as directed ZANTAC 300 MG TABS (RANITIDINE HCL) take one by mouth  once daily LEVSIN/SL 0.125 MG SUBL (HYOSCYAMINE SULFATE) 1 sl q 4-6- hrs as needed CHERATUSSIN AC 100-10 MG/5ML SYRP (GUAIFENESIN-CODEINE) 5 ml two times a day as needed cough VERAMYST 27.5 MCG/SPRAY SUSP (FLUTICASONE FUROATE) as directed BENADRYL 25 MG TABS (DIPHENHYDRAMINE HCL) as needed * 5-HPT 200MG  1 AT BEDTIME BY MOUTH OMEGA-3 CF 1000 MG CAPS (OMEGA-3 FATTY ACIDS) 1 DAILY BY MOUTH DIGESTIVE ENZYMES  TABS (DIGESTIVE ENZYMES) 2 TABLETS DAILY BY MOUTH  REVIEW OF SYSTEMS Constitutional Symptoms      Denies fever, chills, night sweats, weight loss, weight gain, and fatigue.  Eyes       Denies change in vision, eye pain, eye discharge, glasses, contact lenses, and eye surgery. Ear/Nose/Throat/Mouth       Denies hearing loss/aids, change in hearing, ear pain, ear discharge, dizziness, frequent runny nose, frequent nose bleeds, sinus problems, sore throat, hoarseness, and tooth pain or bleeding.      Comments: cold sore lesion in the right upper frontal nares, painful, burning tingling sensation Respiratory       Denies dry cough, productive cough, wheezing, shortness of breath, asthma, bronchitis, and emphysema/COPD.  Cardiovascular       Denies murmurs, chest pain, and tires easily with exhertion.    Gastrointestinal       Denies stomach pain, nausea/vomiting, diarrhea, constipation, blood in bowel movements, and indigestion. Genitourniary       Denies painful urination, kidney stones, and loss of urinary control. Neurological       Denies paralysis, seizures, and fainting/blackouts. Musculoskeletal       Denies muscle pain, joint pain, joint stiffness, decreased range of motion, redness, swelling, muscle weakness, and gout.  Skin       Denies bruising, unusual mles/lumps or sores, and hair/skin or nail changes.  Psych       Denies mood changes, temper/anger issues, anxiety/stress, speech problems, depression, and sleep problems.  Past History:  Family History: Last updated:  2010/01/04 Father: deceased age 31- emphysema, smoker Mother: deceased age 48- ? 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 MRI- thoracic, cervical-- slight disk herniations T6,7,8 with DJD  (11/1997) Cervical dysplasia, abnormal paps Left ovarian cysts x 3, rupture Dexa- mild osteopenia (08/2000)  Improved (09/2004) ABI's- normal (05/2005) Colonoscopy- diverticulosis, hemorrhoids (08/2000) Previous hand surgery stress cardiolite nl 10/08 with EF of 69% thumb joint repaired  Family History: Reviewed  history from 12/11/2009 and no changes required. Father: deceased age 76- emphysema, smoker Mother: deceased age 38- ? lung cancer, smoker, CAD when relatively young Siblings: 2 brothers, 2 sisters one brother and one sister with lymphomia Family History of Diabetes: brother, sister Family History of Heart Disease: brother  Social History: Reviewed history from 12/11/2009 and no changes required. 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  Oral/Pharynx: tongue normal, posterior pharynx without erythema or exudate Neck: neck supple,  trachea midline, no masses Chest/Lungs: no rales, wheezes, or rhonchi bilateral, breath sounds equal without effort Heart: regular rate and  rhythm, no murmur Abdomen: soft, non-tender without obvious organomegaly Extremities: normal extremities Neurological: grossly intact and non-focal Skin: no obvious rashes or lesions MSE: oriented to time, place, and person Assessment  Assessed UTI as improved - Clanford Johnson MD Assessed PERSONAL HISTORY ALLERGY UNSPEC MEDICINAL AGENT as unchanged - Clanford Johnson MD New Problems: HERPES SIMPLEX WITHOUT MENTION OF COMPLICATION (ICD-054.9)   Plan New Medications/Changes: VALTREX 1 GM TABS (VALACYCLOVIR HCL) take two by mouth two times a day prn  #4 x 1, 06/13/2010, Clanford Johnson MD  New Orders: Est. Patient Level IV [81191]  The patient and/or caregiver has been counseled thoroughly with regard to medications prescribed including dosage, schedule, interactions, rationale for use, and possible side effects and they verbalize understanding.  Diagnoses and expected course of recovery discussed and will return if not improved as expected or if the condition worsens. Patient and/or caregiver verbalized understanding.  Prescriptions: VALTREX 1 GM TABS (VALACYCLOVIR HCL) take two by mouth two times a day prn  #4 x 1   Entered and Authorized by:   Standley Dakins MD   Signed by:   Standley Dakins MD on 06/13/2010   Method used:   Electronically to        [facility] [employer] [phone]* (retail)       [address]  [place], [place]  [address]       Ph: [phone] or [phone]       Fax: [phone]   RxID:   [id number]   Patient Instructions: 1)  It was clearly explained to the patient that this [facility] is not intended to be a primary care clinic.  The patient is always better served by the continuity of care and the provider/patient relationships developed with their dedicated primary care provider.  The patient was told to be sure to follow up as soon as possible with their primary care provider to discuss treatments received and to receive further examination and testing.  The patient verbalized understanding. The will f/u with PCP ASAP.  2)  The risks, benefits and possible side effects were clearly explained and discussed with the patient.  The patient verbalized clear understanding.  The patient was given instructions to return if symptoms don't improve, worsen or new changes develop.  If it is not during clinic hours and the patient cannot get back to this clinic then the patient was told to seek medical care at an available  urgent care or emergency department.  The patient verbalized understanding.   3)  The patient's prescriptions were checked for possible interactions and electronically sent to the pharmacy of choice.   4)  The patient was informed that there is no on-call provider or services available at this clinic during off-hours (when the clinic is closed).  If the patient developed a problem or concern that required immediate attention, the patient was advised to go the the nearest available urgent care or emergency department for medical care.  The patient verbalized understanding.     Orders Added: 1)  Est. Patient Level IV [16109]  Rodney Langton, M.D., F.A.A.F.P.  June 13, 2010

## 2010-11-17 NOTE — Assessment & Plan Note (Signed)
Summary: 3 MONTH FOLLOWUP/RBH   Vital Signs:  Patient profile:   60 year old female Height:      62 inches Weight:      203.75 pounds BMI:     37.40 Temp:     98.3 degrees F oral Pulse rate:   80 / minute Pulse rhythm:   regular BP sitting:   116 / 70  (left arm) Cuff size:   large  Vitals Entered By: Lewanda Rife LPN (November 28, 2009 8:29 AM)  History of Present Illness: here for f/u of lipids and DM and HTN and thyroid   overall has been feeling pretty good   had esophageal spasm in december - lasted 20 min in the grocery store  then intermittent choking sensation on and off  then next day had lower back spasm and got shot of [facility]adol at [facility] and some pain pills that got a lot better  did f/u with ortho Dr Shon Baton -- and did thoracic films - scoliosis , then MRI - degeneration and mild bulging disks  sent her to GI for the spasm-- and they wanted her to do EGD -- she was unhappy with that visit and decided not to do that  noticed hoarseness and has upcoming f/u with ENT   has stopped sugar and art sweetners except stuvia -- has noticed some imp   also weaned herself off cymbalta due to wt gain -- is off for 10 days and feels fine  lipids are down slt with LDL 146 (from 160) and HDL up a bit  diet  has been walking and doing pilates  joined wt watchers - but does not like it   AIC is 6.2 up from 6.0 now is really giving up a lot of sugar  opthy-- was about a year ago -- will make her own appt   wt is down 2 lb feels good with what she is doing   bp very well controlled - 116/70 today   thyroid stable with theraputic tsh   had her flu shot   Allergies: 1)  ! * Demadex 2)  ! * Aleve 3)  ! Elavil 4)  ! Effexor 5)  ! Paxil 6)  ! Prozac 7)  ! Lyrica 8)  ! Lipitor 9)  Penicillin 10)  Ceftin 11)  Lodine 12)  Tetracycline 13)  Erythromycin 14)  Sulfa 15)  * Zyrtec 16)  Cardizem 17)  Neurontin 18)  Lasix 19)  * Imapramine  Past History:  Past Surgical  History: Last updated: 08/20/2007 MRI- thoracic, cervical-- slight disk herniations T6,7,8 with DJD  (11/1997) Cervical dysplasia, abnormal paps Left ovarian cysts x 3, rupture Dexa- mild osteopenia (08/2000)  Improved (09/2004) ABI's- normal (05/2005) Colonoscopy- diverticulosis, hemorrhoids (08/2000) Previous hand surgery stress cardiolite nl 10/08 with EF of 69%  Family History: Last updated: 04-06-07 Father: deceased age 57- emphysema, smoker Mother: deceased age 27- ? 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 Eyes:  Denies blurring and  eye pain. CV:  Denies chest pain or discomfort, lightheadness, palpitations, and shortness of breath with exertion. Resp:  Denies cough and wheezing. GI:  Complains of indigestion; denies change in bowel habits, loss of appetite, and nausea. GU:  Denies dysuria and urinary frequency. MS:  Complains of joint pain, muscle aches, and stiffness. Derm:  Denies lesion(s), poor wound healing, and rash. Neuro:  Denies numbness and tingling. Psych:  mood is some better . Endo:  Denies excessive thirst and excessive urination. Heme:  Denies abnormal bruising and bleeding.  Physical Exam  General:  overweight but generally well appearing  Head:  normocephalic, atraumatic, and no abnormalities observed.  Eyes:  vision grossly intact, pupils equal, pupils  round, and pupils reactive to light.   Mouth:  pharynx pink and moist.   Neck:  supple with full rom and no masses or thyromegally, no JVD or carotid bruit  Lungs:  Normal respiratory effort, chest expands symmetrically. Lungs are clear to auscultation, no crackles or wheezes. Heart:  Normal rate and regular rhythm. S1 and S2 normal without gallop, murmur, click, rub or other extra sounds. Abdomen:  Bowel sounds positive,abdomen soft and non-tender without masses, organomegaly or hernias noted. no renal bruits  Msk:  no new joint changes tender points noted on back/spine  feet/legs are hyperemic and normal temperature -- mildy dusky Pulses:  R and L carotid,radial,femoral,dorsalis pedis and posterior tibial pulses are full and equal bilaterally Extremities:  trace left pedal edema and trace right pedal edema.  hyperemia with warm feet but cold toes baseline   Neurologic:  sensation intact to light touch, gait normal, and DTRs symmetrical and normal.   Skin:  Intact without suspicious lesions or rashes Cervical Nodes:  No lymphadenopathy noted Psych:  normal affect, talkative and pleasant    Impression & Recommendations:  Problem # 1:  PURE HYPERCHOLESTEROLEMIA (ICD-272.0) Assessment Improved  overall not at goal with diet - but pt refuses medication  rev low sat fat diet will check intermittently  disc goals for HDL and LDL to prev vascular disease   Labs Reviewed: SGOT: 18 (11/24/2009)   SGPT: 19 (11/24/2009)   HDL:63.60 (11/24/2009), 56.50 (08/01/2009)  LDL:DEL (08/14/2008), 122 (03/25/2008)  Chol:224 (11/24/2009), 249 (08/01/2009)  Trig:77.0 (11/24/2009), 115.0 (08/01/2009)  Problem # 2:  DIABETES MELLITUS, TYPE II (ICD-250.00) Assessment: Deteriorated  slt worse with holiday eating - now back on track  utd opthy  doing well with low sugar diet and exercise stressed imp of wt loss  plan lab and f/u 3 mo  Her updated medication list for this problem includes:    Altace 10  Mg Caps (Ramipril) .Marland Kitchen... 1 by mouth once daily    Aspirin 81 Mg Tabs (Aspirin) .Marland Kitchen... 1 by mouth once daily  Labs Reviewed: Creat: 0.8 (11/24/2009)     Last Eye Exam: normal (10/27/2008) Reviewed HgBA1c results: 6.2 (11/24/2009)  6.0 (08/01/2009)  Problem # 3:  HYPOTHYROIDISM (ICD-244.9) Assessment: Unchanged  clinically stable with nl tsh  continue to monitor  Her updated medication list for this problem includes:    Synthroid 125 Mcg Tabs (Levothyroxine sodium) .Marland Kitchen... Take one by mouth daily  Labs Reviewed: TSH: 0.42 (11/24/2009)    HgBA1c: 6.2 (11/24/2009) Chol: 224 (11/24/2009)   HDL: 63.60 (11/24/2009)   LDL: DEL (08/14/2008)   TG: 77.0 (11/24/2009)  Problem # 4:  GERD (ICD-530.81) Assessment: Deteriorated for ent f/u for hoarseness soon  will consider GI ref to [place] if esoph spasm returns Her updated medication list for this  problem includes:    Nexium 40 Mg Cpdr (Esomeprazole magnesium) .Marland Kitchen... Take one by mouth two times a day  Problem # 5:  HYPERTENSION (ICD-401.9) Assessment: Unchanged  very well controlled disc healthy diet (low simple sugar/ choose complex carbs/ low sat fat) diet and exercise in detail  no change in med lab and f/u 3 mo  Her updated medication list for this problem includes:    Altace 10 Mg Caps (Ramipril) .Marland Kitchen... 1 by mouth once daily    Cardizem La 120 Mg Xr24h-tab (Diltiazem hcl coated beads) .Marland Kitchen... Take 1 tablet by mouth once a day  BP today: 116/70 Prior BP: 112/72 (08/25/2009)  Labs Reviewed: K+: 4.6 (11/24/2009) Creat: : 0.8 (11/24/2009)   Chol: 224 (11/24/2009)   HDL: 63.60 (11/24/2009)   LDL: DEL (08/14/2008)   TG: 77.0 (11/24/2009)  Complete Medication List: 1)  Nexium 40 Mg Cpdr (Esomeprazole magnesium) .... Take one by mouth two times a day 2)  Altace 10 Mg Caps (Ramipril) .Marland Kitchen.. 1 by mouth once daily 3)  Synthroid 125 Mcg Tabs (Levothyroxine sodium) .... Take one by mouth daily 4)  Fish Oil 1000 Mg Caps (Omega-3 fatty acids)  .... Take one by mouth daily 5)  Iron 325 (65 Fe) Mg Tabs (Ferrous sulfate) .... Take one by mouth at bedtime 6)  Maxalt-mlt 10 Mg Tbdp (Rizatriptan benzoate) .... Take one by mouth as needed as directed for migraine 7)  Valtrex 1 Gm Tabs (Valacyclovir hcl) .... Take two by mouth two times a day prn 8)  Cyclobenzaprine Hcl 10 Mg Tabs (Cyclobenzaprine hcl) .... Take 1/2 to 1 by mouth once daily and 1 by mouth at bedtime 9)  Triamcinolone .1%  .... Use on aff area once daily as needed (legs) 10)  Vitamin D 1000 Unit Tabs (Cholecalciferol) .... Daily 11)  Wellbutrin Xl 150 Mg Xr24h-tab (Bupropion hcl) .Marland Kitchen.. 1 by mouth once daily 12)  Hycodan  .Marland Kitchen.. 1 tsp every 4-6hrs as needed cough 13)  Flonase 50 Mcg/act Susp (Fluticasone propionate) .... Two spray each nostril daily as directed 14)  Cardizem La 120 Mg Xr24h-tab (Diltiazem hcl coated beads) .... Take 1 tablet by mouth once a day 15)  Aleve 220 Mg Tabs (Naproxen sodium) .... One tab twice a day 16)  Multivitamins Tabs (Multiple vitamin) .... One daily 17)  Probiotic Caps (Probiotic product) .... Otc as directed. 18)  Onetouch Test Strp (Glucose blood) .... Use as directed 19)  Aspirin 81 Mg Tabs (Aspirin) .Marland Kitchen.. 1 by mouth once daily 20)  Magnesium ?mg  .... Take one daily  Patient Instructions: 1)  make sure to see your eye doctor for yearly exam  2)  see your ENT as planned  3)  if GI symptoms do not improve further - please call for referral to [facility] specialist  4)  keep working hard on diet and exercise and weight loss  5)  schedule non fasting lab in 3 months AIC and renal panel 250.0 Prescriptions: CARDIZEM LA 120 MG XR24H-TAB (DILTIAZEM HCL COATED BEADS) Take 1 tablet by mouth once a day  #90 x 3   Entered and Authorized by:   Judith Part MD   Signed by:   Judith Part MD on 11/28/2009   Method used:   Print then Give to Patient   RxID:   [id number] CARDIZEM LA 120 MG XR24H-TAB (DILTIAZEM HCL COATED BEADS) Take 1  tablet by mouth once a day  #14 x 0   Entered and Authorized by:  Judith Part MD   Signed by:   Judith Part MD on 11/28/2009   Method used:   Print then Give to Patient   RxID:   [id number] ALTACE 10 MG  CAPS (RAMIPRIL) 1 by mouth once daily  #90 x 3   Entered and Authorized by:   Judith Part MD   Signed by:   Judith Part MD on 11/28/2009   Method used:   Print then Give to Patient   RxID:   [id number] ALTACE 10 MG  CAPS (RAMIPRIL) 1 by mouth once daily  #14 x 0   Entered and Authorized by:   Judith Part MD   Signed by:   Judith Part MD on 11/28/2009   Method used:   Print then Give to Patient   RxID:   [id number]   Current Allergies (reviewed today): ! * [facility] ! * ALEVE ! ELAVIL ! [facility] ! PAXIL ! PROZAC ! LYRICA ! LIPITOR PENICILLIN CEFTIN LODINE TETRACYCLINE ERYTHROMYCIN SULFA * ZYRTEC CARDIZEM NEURONTIN LASIX * IMAPRAMINE

## 2010-11-17 NOTE — Procedures (Signed)
Summary: Colonoscopy  Patient: Deseri Loss Note: All result statuses are Final unless otherwise noted.  Tests: (1) Colonoscopy (COL)   COL Colonoscopy           DONE     [facility]     [address]     [place], [place]  [address]           COLONOSCOPY PROCEDURE REPORT           PATIENT:  Tina Berger, Tina Berger  MR#:  [id number]     BIRTHDATE:  1951/02/17, 58 yrs. old  GENDER:  female           ENDOSCOPIST:  Vania Rea. Jarold Motto, MD, [facility]     Referred by:           PROCEDURE DATE:  12/26/2009     PROCEDURE:  Average-risk screening colonoscopy     X9147     ASA CLASS:  Class III     INDICATIONS:  Routine Risk Screening           MEDICATIONS:   Fentanyl 75 mcg IV, Versed 8 mg IV           DESCRIPTION OF PROCEDURE:   After the risks benefits and     alternatives of the procedure were thoroughly explained, informed     consent was obtained.  Digital rectal exam was performed and     revealed no abnormalities.   The LB CF-H180AL [id number] endoscope     was introduced through the anus and advanced to the cecum, which     was identified by both the appendix and ileocecal valve, without     limitations.  The quality of the prep was adequate, using     MoviPrep.  The instrument was then slowly withdrawn as the colon     was fully examined.     <<PROCEDUREIMAGES>>           FINDINGS:  Severe diverticulosis was found sigmoid to descending     RED,THICKENED HAUSTRAL FOLDS NOTED.SEVER TICS AND SPASM.  No     polyps or cancers were seen.   Retroflexed views in the rectum     revealed no abnormalities.    The scope was then withdrawn from     the patient and the procedure completed.           COMPLICATIONS:  None           ENDOSCOPIC IMPRESSION:     1) Severe diverticulosis in the sigmoid to descending     2) No polyps or cancers     RECOMMENDATIONS:     1) high fiber diet     2) metamucil or benefiber           REPEAT EXAM:  No           ______________________________  Vania Rea. Jarold Motto, MD, [facility]           CC:  Judy Pimple, MD           n.     Rosalie DoctorVania Rea. Cuahutemoc Attar at 12/26/2009 02:39 PM           Cherie Dark, [phone]  Note: An exclamation mark (!) indicates a result that was not dispersed into the flowsheet. Document Creation Date: 12/26/2009 2:40 PM _______________________________________________________________________  (1) Order result status: Final Collection or observation date-time: 12/26/2009 14:34 Requested date-time:  Receipt date-time:  Reported date-time:  Referring Physician:   Ordering Physician: Onalee Hua  Jarold Motto [phone]) Specimen Source:  Source: Launa Grill Order Number: [id number] Lab site:

## 2010-11-17 NOTE — Progress Notes (Signed)
Summary: needs something for cough  Phone Note Call from Patient Call back at Home Phone [phone]   Caller: Patient Call For: Judith Part MD Summary of Call: Patient says that she has a dry, scratchy cough that she can not get rid of and nothing seems to help it. She wants to know if she can [facility] [address]  Initial call taken by: Melody Comas,  December 10, 2009 9:12 AM  Follow-up for Phone Call        if she is having no other symptoms at all - I am worried about altace reaction causing her cough if she has not tried it before- ask her to hold her altace for 3 days and let me know if cough lets up (before we try med)  that is - unless she has already tried that Follow-up by: Judith Part MD,  December 10, 2009 11:22 AM  Additional Follow-up for Phone Call Additional follow up Details #1::        Symptoms started Sat 12/06/09 with fever, body aches, sorethroat, sinus congestion, cough and h/a.  Today primarily bothered with cough. Pt does not think related to Altace.Lewanda Rife LPN  December 10, 2009 11:28 AM   I agree I recommend mucinex DM otc for cough - try this first  cannot px stronger cough med without eval and I do want her to f/u if not improving Additional Follow-up by: Judith Part MD,  December 10, 2009 12:14 PM    Additional Follow-up for Phone Call Additional follow up Details #2::    Patient Advised.  Follow-up by: Delilah Shan CMA Duncan Dull),  December 10, 2009 12:49 PM

## 2010-11-17 NOTE — Miscellaneous (Signed)
Summary: clotest  Clinical Lists Changes  Orders: Added new Test order of TLB-H Pylori Screen Gastric Biopsy (83013-CLOTEST) - Signed 

## 2010-11-17 NOTE — Letter (Signed)
Summary: Historic Patient File  Historic Patient File   Imported By: [place] Carbo 05/07/2010 11:06:38  _____________________________________________________________________  External Attachment:    Type:   Image     Comment:   External Document

## 2010-11-17 NOTE — Progress Notes (Signed)
Summary: Ultrasound results   Phone Note Call from Patient Call back at Work Phone [phone]   Caller: Patient Call For: Dr. Jarold Motto Reason for Call: Lab or Test Results Summary of Call: Pt is calling about her ultrasound results Initial call taken by: Karna Christmas,  December 16, 2009 9:39 AM  Follow-up for Phone Call        Pt notified.   Follow-up by: Ashok Cordia RN,  December 17, 2009 8:23 AM

## 2010-11-17 NOTE — Procedures (Signed)
Summary: Upper Endoscopy  Patient: Tina Berger Note: All result statuses are Final unless otherwise noted.  Tests: (1) Upper Endoscopy (EGD)   EGD Upper Endoscopy       DONE     [facility]     [address]     [place], [place]  [address]           ENDOSCOPY PROCEDURE REPORT           PATIENT:  Tina Berger, Tina Berger  MR#:  [id number]     BIRTHDATE:  [birth year], 58 yrs. old  GENDER:  female           ENDOSCOPIST:  Vania Rea. Jarold Motto, MD, [facility]     Referred by:           PROCEDURE DATE:  12/26/2009     PROCEDURE:  EGD with biopsy     ASA CLASS:  Class III     INDICATIONS:  chest pain           MEDICATIONS:   There was residual sedation effect present from     prior procedure., Versed 2 mg IV, Fentanyl 25 mcg IV     TOPICAL ANESTHETIC:  Exactacain Spray           DESCRIPTION OF PROCEDURE:   After the risks benefits and     alternatives of the procedure were thoroughly explained, informed     consent was obtained.  The LB GIF-H180 [id number] endoscope was     introduced through the mouth and advanced to the second portion of     the duodenum, without limitations.  The instrument was slowly     withdrawn as the mucosa was fully examined.     <<PROCEDUREIMAGES>>           Normal GE junction was noted. RANDOM ESOPHAGEAL BIOPSIES DONE.     Normal duodenal folds were noted. RANDOM DUODENAL BIOPSIES DONE.     The stomach was entered and closely examined. The antrum,     angularis, and lesser curvature were well visualized, including a     retroflexed view of the cardia and fundus. The stomach wall was     normally distensable. The scope passed easily through the pylorus     into the duodenum. CLO BX. DONE.  other findings. NORMAL     HYPOPHARYNX AND VOCAL CORDS.    Retroflexed views revealed no     masses.    The scope was then withdrawn from the patient and the     procedure completed.           COMPLICATIONS:  None           ENDOSCOPIC IMPRESSION:     1) Normal GE junction    2) Normal duodenal folds     3) Normal stomach     4) Other findings     5) No masses     1.R/O SPRUE     2.R/O EOSINOPHILIC ESOPHAGITIS     3.R/O H.PYLORI     RECOMMENDATIONS:     1) Await biopsy results     2) Rx CLO if positive     3) continue PPI           REPEAT EXAM:  No           ______________________________     Vania Rea. Jarold Motto, MD, [facility]           CC:  Judy Pimple, MD  n.     eSIGNED:   Vania Rea. Jamason Peckham at 12/26/2009 02:51 PM           Cherie Dark, [phone]  Note: An exclamation mark (!) indicates a result that was not dispersed into the flowsheet. Document Creation Date: 12/26/2009 2:52 PM _______________________________________________________________________  (1) Order result status: Final Collection or observation date-time: 12/26/2009 14:45 Requested date-time:  Receipt date-time:  Reported date-time:  Referring Physician:   Ordering Physician: Sheryn Bison [phone]) Specimen Source:  Source: Launa Grill Order Number: [id number] Lab site:

## 2010-11-19 NOTE — Progress Notes (Signed)
Summary: Cyclobenzaprine 10mg   Phone Note Refill Request Call back at [phone] Message from:  [facility] on November 09, 2010 10:40 AM  Refills Requested: Medication #1:  CYCLOBENZAPRINE HCL 10 MG TABS take 1/2 to 1 by mouth once daily and 1 by mouth at bedtime   Last Refilled: 08/16/2010 [facility] [phone] electronically request refill for Cyclobenzaprine 10mg . last refill date 08/16/2010.Please advise.    Method Requested: Telephone to Pharmacy Initial call taken by: Lewanda Rife LPN,  November 09, 2010 10:41 AM  Follow-up for Phone Call        px written on EMR for call in  Follow-up by: Judith Part MD,  November 09, 2010 1:44 PM  Additional Follow-up for Phone Call Additional follow up Details #1::        Medication phoned to [facility] pharmacy as instructed. rx cancelled at [facility] LPN  November 09, 2010 2:10 PM     New/Updated Medications: CYCLOBENZAPRINE HCL 10 MG TABS (CYCLOBENZAPRINE HCL) take 1/2 to 1 by mouth once daily and 1 by mouth at bedtime Prescriptions: CYCLOBENZAPRINE HCL 10 MG TABS (CYCLOBENZAPRINE HCL) take 1/2 to 1 by mouth once daily and 1 by mouth at bedtime  #180 x 1   Entered by:   Lewanda Rife LPN   Authorized by:   Judith Part MD   Signed by:   Lewanda Rife LPN on 29/56/2130   Method used:   Telephoned to ...       [facility] [phone]* (retail)       [address] [place], [place]  [address]       Ph: [phone] or [phone]       Fax: [phone]   RxID:   [id number] CYCLOBENZAPRINE HCL 10 MG TABS (CYCLOBENZAPRINE HCL) take 1/2 to 1 by mouth once daily and 1 by mouth at bedtime  #180 x 1   Entered by:   Lewanda Rife LPN   Authorized by:   Judith Part MD   Signed by:   Lewanda Rife LPN on 84/16/6063   Method used:   Electronically to        [facility] [phone]* (retail)       [address] [place], [place]  [address]       Ph: [phone] or  [phone]       Fax: [phone]   RxID:   [id number]

## 2010-11-19 NOTE — Progress Notes (Signed)
Summary: ? Bupropion refill  Phone Note Refill Request Message from:  [facility] on October 05, 2010 12:59 PM  [facility] sent electronic request for refill on Bupropion HCL XL 150mg  #90 taking one tablet by mouth daily. Med was removed from pt's med list on 03/18/10 because med had been stopped. Left message for patient to call back to see if pt wants med.Lewanda Rife LPN  October 05, 2010 1:01 PM    Follow-up for Phone Call        I spoke with pt and she did go back on the Bupropion HCL XL 150mg  taking one daily. Pt already has appt scheduled to see Dr Milinda Antis on 12/02/10 at 8:30am. Pt said she is doing OK now.Please advise. ( I will put med back on med list.)[facility] LPN  October 05, 2010 2:38 PM   Additional Follow-up for Phone Call Additional follow up Details #1::        Med faxed to [facility] as instructed.Patient notified as instructed by telephone. Lewanda Rife LPN  October 06, 2010 8:54 AM     Prescriptions: BUDEPRION XL 150 MG XR24H-TAB (BUPROPION HCL) Take 1 tablet by mouth once a day  #30 x 11   Entered by:   Lewanda Rife LPN   Authorized by:   Judith Part MD   Signed by:   Lewanda Rife LPN on 81/19/1478   Method used:   Faxed to ...       [facility] (mail-order)             , [place]         Ph: [phone]       Fax: [phone]   RxID:   [id number]

## 2010-11-19 NOTE — Miscellaneous (Signed)
Summary: Bupropion XL 150mg  med list update.  Medications Added BUDEPRION XL 150 MG XR24H-TAB (BUPROPION HCL) Take 1 tablet by mouth once a day       Clinical Lists Changes  Medications: Added new medication of BUDEPRION XL 150 MG XR24H-TAB (BUPROPION HCL) Take 1 tablet by mouth once a day     Current Allergies: ! * DEMADEX ! * ALEVE ! ELAVIL ! [facility] ! PAXIL ! PROZAC ! LYRICA ! LIPITOR ! CIPRO ! CIPRO ! MACROBID ! * MOLDE AND MILDEW PENICILLIN CEFTIN LODINE TETRACYCLINE ERYTHROMYCIN SULFA * ZYRTEC CARDIZEM NEURONTIN LASIX * IMAPRAMINE

## 2010-11-20 NOTE — Letter (Signed)
Summary: medication list  medication list   Imported By: Rosine Beat 06/13/2010 17:16:24  _____________________________________________________________________  External Attachment:    Type:   Image     Comment:   External Document

## 2010-11-28 ENCOUNTER — Encounter: Payer: Self-pay | Admitting: Family Medicine

## 2010-12-02 ENCOUNTER — Ambulatory Visit: Payer: Self-pay | Admitting: Family Medicine

## 2010-12-15 NOTE — Letter (Signed)
Summary: [facility]   Imported By: Kassie Mends 12/04/2010 09:45:55  _____________________________________________________________________  External Attachment:    Type:   Image     Comment:   External Document

## 2010-12-22 ENCOUNTER — Encounter: Payer: Self-pay | Admitting: Orthopedic Surgery

## 2011-01-17 ENCOUNTER — Encounter: Payer: Self-pay | Admitting: Orthopedic Surgery

## 2011-01-18 ENCOUNTER — Encounter: Payer: Self-pay | Admitting: Family Medicine

## 2011-01-18 ENCOUNTER — Other Ambulatory Visit: Payer: Self-pay | Admitting: Family Medicine

## 2011-01-26 ENCOUNTER — Other Ambulatory Visit: Payer: Self-pay | Admitting: Family Medicine

## 2011-01-27 NOTE — Telephone Encounter (Signed)
Pt must call for appt. 

## 2011-02-16 ENCOUNTER — Encounter: Payer: Self-pay | Admitting: Orthopedic Surgery

## 2011-02-16 LAB — HM DIABETES EYE EXAM: HM Diabetic Eye Exam: NORMAL

## 2011-03-05 NOTE — Assessment & Plan Note (Signed)
[facility]                                   ON-CALL NOTE   WESLYNN, KE                         MRN:          [id number]  DATE:08/14/2006                            DOB:          04/23/1951    TELEPHONE TRIAGE NOTE   Time received is 12:43 p.m.  The patient is Tina Berger.  The caller is the  same.  She sees Dr. Milinda Antis.  Date of birth 16-Oct-1951.  Telephone [phone].  The patient is describing symptoms of conjunctivitis in both eyes.  She has had a viral type upper respiratory syndrome for a week including  stuffy head, cough, sore throat, and ulcers in her mouth.  Now, today, she  has irritation in both eyes and a greenish discharge in both eyes.  She does  not wear contact lenses.  My response is to call in Tobramycin eyedrops to  use q.i.d. until clear.  [facility] [place] at  [phone].  Told the patient she could followup this coming week if not  better.    ______________________________  Tera Mater Clent Ridges, MD    SAF/MedQ  DD: 08/14/2006  DT: 08/15/2006  Job #: [id number]   cc:   Marne A. Milinda Antis, MD

## 2011-03-05 NOTE — Assessment & Plan Note (Signed)
[facility]                                 ON-CALL NOTE   Tina Berger, Tina Berger                         MRN:          [id number]  DATE:09/24/2006                            DOB:          [birth year]    This is a patient of Marne A. Tower, MD   The patient called because she has Raynaud's phenomena, and her toes are  blue.  She says she has had this for 20 years, and she has been treated  with calcium channel blockers and antibiotics.  She said she called the  office on Friday and was told Dr. Milinda Antis was busy and could not see her,  was given appointment for Monday.  The patient calling Saturday because  she wants to be seen.  Come to Saturday clinic for evaluation.     Jeffrey A. Tawanna Cooler, MD  Electronically Signed    JAT/MedQ  DD: 09/24/2006  DT: 09/24/2006  Job #: [id number]

## 2011-03-05 NOTE — Assessment & Plan Note (Signed)
[facility]                                 ON-CALL NOTE   MAHOGANY, TORRANCE                         MRN:          [id number]  DATE:12/31/2006                            DOB:          [birth year]    PRIMARY CARE PHYSICIAN:  Dr. Pattricia Boss   Ms. Babilonia calls in today stating that she did not receive sufficient  prednisone for a contact dermatitis.  According to Ms. Novak, Dr. Pattricia Boss  advised her that she was to take 20 mg tablets three daily for 3 days  and to taper down thereafter.  Unfortunately, she only received three  pills.   PLAN:  I reviewed the information from the doctor first and confirmed  that the patient received only three pills.  I went ahead and advised  the patient that I will call in 6 additional pills for her to take three  on Sunday and on three on Monday.  I advised the patient to call Dr.  Pattricia Boss on Monday so she can be advised of the taper thereafter.  The  patient expressed understanding.     Leanne Chang, M.D.  Electronically Signed    LA/MedQ  DD: 12/31/2006  DT: 01/01/2007  Job #: [id number]

## 2011-03-07 ENCOUNTER — Other Ambulatory Visit: Payer: Self-pay | Admitting: Gastroenterology

## 2011-03-09 ENCOUNTER — Other Ambulatory Visit: Payer: Self-pay | Admitting: *Deleted

## 2011-03-09 MED ORDER — HYOSCYAMINE SULFATE 0.125 MG SL SUBL: 0.1250 mg | SUBLINGUAL_TABLET | SUBLINGUAL | Status: DC | PRN

## 2011-03-13 ENCOUNTER — Other Ambulatory Visit: Payer: Self-pay | Admitting: Family Medicine

## 2011-03-28 ENCOUNTER — Telehealth: Payer: Self-pay | Admitting: Family Medicine

## 2011-03-28 DIAGNOSIS — M949 Disorder of cartilage, unspecified: Secondary | ICD-10-CM

## 2011-03-28 DIAGNOSIS — M899 Disorder of bone, unspecified: Secondary | ICD-10-CM

## 2011-03-28 DIAGNOSIS — Z0000 Encounter for general adult medical examination without abnormal findings: Secondary | ICD-10-CM | POA: Insufficient documentation

## 2011-03-28 DIAGNOSIS — E119 Type 2 diabetes mellitus without complications: Secondary | ICD-10-CM

## 2011-03-28 DIAGNOSIS — E039 Hypothyroidism, unspecified: Secondary | ICD-10-CM

## 2011-03-28 DIAGNOSIS — E559 Vitamin D deficiency, unspecified: Secondary | ICD-10-CM

## 2011-03-28 DIAGNOSIS — E785 Hyperlipidemia, unspecified: Secondary | ICD-10-CM

## 2011-03-28 HISTORY — DX: Encounter for general adult medical examination without abnormal findings: Z00.00

## 2011-03-28 NOTE — Telephone Encounter (Signed)
Message copied by Judy Pimple on Sun Mar 28, 2011  7:31 PM ------      Message from: Baldomero Lamy      Created: Wed Mar 24, 2011  9:27 AM      Regarding: Cpx labs Wed       Please order  future cpx labs for pt's upcomming lab appt.      Thanks      Rodney Booze

## 2011-03-31 ENCOUNTER — Other Ambulatory Visit (INDEPENDENT_AMBULATORY_CARE_PROVIDER_SITE_OTHER): Payer: Managed Care, Other (non HMO) | Admitting: Family Medicine

## 2011-03-31 DIAGNOSIS — E039 Hypothyroidism, unspecified: Secondary | ICD-10-CM

## 2011-03-31 DIAGNOSIS — E119 Type 2 diabetes mellitus without complications: Secondary | ICD-10-CM

## 2011-03-31 DIAGNOSIS — E559 Vitamin D deficiency, unspecified: Secondary | ICD-10-CM

## 2011-03-31 DIAGNOSIS — Z0000 Encounter for general adult medical examination without abnormal findings: Secondary | ICD-10-CM

## 2011-03-31 DIAGNOSIS — E785 Hyperlipidemia, unspecified: Secondary | ICD-10-CM

## 2011-03-31 DIAGNOSIS — M899 Disorder of bone, unspecified: Secondary | ICD-10-CM

## 2011-03-31 LAB — HEMOGLOBIN A1C: Hgb A1c MFr Bld: 6.6 % — ABNORMAL HIGH (ref 4.6–6.5)

## 2011-03-31 LAB — COMPREHENSIVE METABOLIC PANEL
ALT: 23 U/L (ref 0–35)
Albumin: 4.6 g/dL (ref 3.5–5.2)
Alkaline Phosphatase: 75 U/L (ref 39–117)
Potassium: 5.4 mEq/L — ABNORMAL HIGH (ref 3.5–5.1)
Sodium: 141 mEq/L (ref 135–145)
Total Bilirubin: 0.5 mg/dL (ref 0.3–1.2)
Total Protein: 7.4 g/dL (ref 6.0–8.3)

## 2011-03-31 LAB — CBC WITH DIFFERENTIAL/PLATELET
Basophils Absolute: 0.1 10*3/uL (ref 0.0–0.1)
HCT: 40.9 % (ref 36.0–46.0)
Lymphs Abs: 1.9 10*3/uL (ref 0.7–4.0)
MCV: 91.1 fl (ref 78.0–100.0)
Monocytes Absolute: 0.5 10*3/uL (ref 0.1–1.0)
Monocytes Relative: 9.4 % (ref 3.0–12.0)
Neutrophils Relative %: 49.1 % (ref 43.0–77.0)
Platelets: 305 10*3/uL (ref 150.0–400.0)
RDW: 13.4 % (ref 11.5–14.6)
WBC: 5.8 10*3/uL (ref 4.5–10.5)

## 2011-03-31 LAB — LIPID PANEL
HDL: 54.9 mg/dL (ref >39.00)
Total CHOL/HDL Ratio: 4
VLDL: 22.6 mg/dL (ref 0.0–40.0)

## 2011-04-01 ENCOUNTER — Telehealth: Payer: Self-pay

## 2011-04-01 LAB — VITAMIN D 25 HYDROXY (VIT D DEFICIENCY, FRACTURES): Vit D, 25-Hydroxy: 30 ng/mL (ref 30–89)

## 2011-04-01 NOTE — Telephone Encounter (Signed)
Ok- will re check it at that visit-thanks

## 2011-04-01 NOTE — Telephone Encounter (Signed)
Message copied by Patience Musca on Thu Apr 01, 2011  3:04 PM ------      Message from: Roxy Manns A      Created: Thu Apr 01, 2011  2:40 PM       Pt has upcoming f/u and will disc labs then but noted high K       ? Any chance hemolyzed?      Please ask if she is taking any med/ vit with K      thanks

## 2011-04-01 NOTE — Telephone Encounter (Signed)
Left v/m for pt to call back. Terri in our lab said if blood had been hemolyzed lab would have noted on results.

## 2011-04-01 NOTE — Telephone Encounter (Signed)
Patient notified as instructed by telephone. Pt said to her knowledge she is not taking any vitamin, supplement or any med that contains K. Pt is not eating foods high in K like, canteloupe, bananas or orange juice. Pt said she has CPX appt on 04/07/11 at 2:30pm.

## 2011-04-07 ENCOUNTER — Ambulatory Visit (INDEPENDENT_AMBULATORY_CARE_PROVIDER_SITE_OTHER): Payer: Managed Care, Other (non HMO) | Admitting: Family Medicine

## 2011-04-07 ENCOUNTER — Encounter: Payer: Self-pay | Admitting: Family Medicine

## 2011-04-07 VITALS — BP 120/70 | HR 64 | Temp 98.5°F | Ht 62.5 in | Wt 205.8 lb

## 2011-04-07 DIAGNOSIS — E875 Hyperkalemia: Secondary | ICD-10-CM

## 2011-04-07 DIAGNOSIS — M549 Dorsalgia, unspecified: Secondary | ICD-10-CM

## 2011-04-07 MED ORDER — HYDROCODONE-ACETAMINOPHEN 5-500 MG PO TABS: 1.0000 | ORAL_TABLET | Freq: Four times a day (QID) | ORAL | Status: DC | PRN

## 2011-04-07 NOTE — Assessment & Plan Note (Signed)
?   From hemolysis or ace  Re check today If not nl - will reduce ace dose

## 2011-04-07 NOTE — Patient Instructions (Signed)
Ice on back today Then heat -- 10 minutes at a time  Slow gentle walking is best to keep back from getting stiff  If worse pain or numbness of leg or weakness of leg - call or go to ER Flexeril with caution (sedation) vicodin with caution(sedation)  If not improved in 7-10 days follow up with Dr Shon Baton Re checking potassium today- and will update you

## 2011-04-07 NOTE — Assessment & Plan Note (Signed)
Worse back pain today after getting out of the car -- suspect muscular  No ref flags for neurol damage Has hx of spinal stenosis Will tx with flexeril/ aleve/ vicodin carefully  Also stretches/ gentle walking/ heat/ice as appropriate If worse or not imp will f/u Dr Shon Baton

## 2011-04-07 NOTE — Progress Notes (Signed)
Subjective:    Patient ID: Tina Berger, female    DOB: 01-24-1951, 60 y.o.   MRN: [id number]  HPI Here for health mt exam -- but decided to just address her back pain instead which is acut  Wt is up 2 lb with bmi of 37  K high at 5.4-- ? Why -- no supplements or problems  Is on altace   --------------------------------------------------  Unable to do physical today  Is here for back pain  Hurt her back when she got to work this am  Got out of the car there was a loud pop and then she felt it  Is hurting primarily L side - waist and below  Sharp initially  Now when she tries to walk is dull  Shooting pain down L leg (in addn to the numb feeling )  No strength loss in that leg  Never had surgery to her low back   Feels like a spasm - worse upon standing and walking and sitting for long periods  Sitting for short periods gives her some relief  1/2 flexeril this am -did help a bit  Takes aleve prn for pain -- did take some today (had already taken that)   Was supposed to see Dr Shon Baton in 6 months  Went to PT - but that was for neck only  They gave her some good core strengthening exercises      Low back felt weaker than usual today - esp with walking    Chronic low back pain  Has seen Dr Shon Baton in the past  MRI since christmas - showed stenosis - but she is not sure at what level   L leg is chronically numb from LS DD and also cervical laminectomy  Patient Active Problem List  Diagnoses  . HYPOTHYROIDISM  . DIABETES MELLITUS, TYPE II  . UNSPECIFIED VITAMIN D DEFICIENCY  . HYPERLIPIDEMIA  . ANEMIA-IRON DEFICIENCY  . ANXIETY  . PANIC ATTACK  . DEPRESSION  . KERATOCONJUNCTIVITIS SICCA  . HYPERTENSION  . MITRAL VALVE PROLAPSE  . ABNORMAL HEART RHYTHMS  . RAYNAUD'S SYNDROME  . ESOPHAGITIS  . GERD  . GASTRITIS  . IBS  . UTI  . ROSACEA  . OSTEOARTHRITIS  . BACK PAIN  . PLANTAR FASCIITIS  . FIBROMYALGIA  . OSTEOPENIA  . SOMATIC DYSFUNCTION  . SYMPTOM,  EDEMA  . PALPITATIONS  . MIGRAINES, HX OF  . PERSONAL HISTORY ALLERGY UNSPEC MEDICINAL AGENT  . Routine general medical examination at a health care facility  . Hyperkalemia   Past Medical History  Diagnosis Date  . GERD (gastroesophageal reflux disease)   . HLD (hyperlipidemia)   . HTN (hypertension)   . Hypothyroidism   . Osteoarthritis     hands  . Anemia, iron deficiency   . Diabetes mellitus type II     Diet controlled  . Edema   . Raynaud's disease   . Fibromyalgia   . Dry eyes   . Recurrent HSV (herpes simplex virus)     lesions in nose  . HNP (herniated nucleus pulposus) 2/99    T6,7,8 with DJD  . DJD (degenerative joint disease)   . Cervical dysplasia     abnormal paps  . Left ovarian cyst     x 3, rupture  . Osteopenia     mild-11/01; improved 12/05  . Diverticulosis   . Hemorrhoids    Past Surgical History  Procedure Date  . Hand surgery   . Thumb joint repair   .  Colonoscopy 11/01    Diverticulosis; hemorrhoids   History  Substance Use Topics  . Smoking status: Former Games developer  . Smokeless tobacco: Never Used   Comment: quit over 40 years  . Alcohol Use: Yes     very rarely   Family History  Problem Relation Age of Onset  . Emphysema Father     + smoker  . Lung cancer Mother     + smoker  . Coronary artery disease Mother     relatively young  . Lymphoma Brother   . Lymphoma Sister   . Diabetes Brother   . Diabetes Sister   . Heart disease Brother    Allergies  Allergen Reactions  . Amitriptyline Hcl     REACTION: sedating  . Atorvastatin     REACTION: muscle  . Cefuroxime Axetil     REACTION: reaction not known  . Cetirizine Hcl     REACTION: headache  . Ciprofloxacin     REACTION: ? rash vs sun rxn  . Diltiazem Hcl     REACTION: reaction not known  . Erythromycin     REACTION: reaction not known  . Etodolac     REACTION: reaction not known  . Fluoxetine Hcl     REACTION: stomach problems  . Furosemide     REACTION:  swelling  . Gabapentin     REACTION: edema  . Naproxen Sodium     REACTION: edema  . Nitrofurantoin     REACTION: whole body rash  . Paroxetine     REACTION: weight gain  . Penicillins     REACTION: reactions to all derivitives  . Pregabalin     REACTION: swelling  . Sulfonamide Derivatives     REACTION: reaction not known  . Tetracycline     REACTION: reaction not known  . Torsemide     REACTION: swelling  . Venlafaxine     REACTION: sweating   Current Outpatient Prescriptions on File Prior to Visit  Medication Sig Dispense Refill  . CARDIZEM LA 120 MG 24 hr tablet TAKE 1 TABLET ONCE A DAY  90 each  0  . hyoscyamine (LEVSIN SL) 0.125 MG SL tablet Take 1 tablet (0.125 mg total) by mouth every 4 (four) hours as needed for cramping.  90 tablet  1  . levothyroxine (SYNTHROID, LEVOTHROID) 125 MCG tablet Take 125 mcg by mouth daily.        Marland Kitchen levothyroxine (SYNTHROID, LEVOTHROID) 125 MCG tablet TAKE ONE TABLET BY MOUTH ONE TIME DAILY  30 tablet  0  . ramipril (ALTACE) 10 MG capsule TAKE 1 CAPSULE DAILY  90 capsule  0      Review of Systems Review of Systems  Constitutional: Negative for fever, appetite change, fatigue and unexpected weight change.  Eyes: Negative for pain and visual disturbance.  Respiratory: Negative for cough and shortness of breath.   Cardiovascular: Negative. For cp or sob or palp  Gastrointestinal: Negative for nausea, diarrhea and constipation.  Genitourinary: Negative for urgency and frequency.  Skin: Negative for pallor. Tina Berger  MSK pos for L low back pain rad down her leg  Neurological: Negative for weakness, light-headedness, numbness and headaches.  Hematological: Negative for adenopathy. Does not bruise/bleed easily.  Psychiatric/Behavioral: Negative for dysphoric mood. The patient is not nervous/anxious.         Objective:   Physical Exam  Constitutional: She appears well-developed and well-nourished. No distress.       overwt and well  appearing   HENT:  Head: Normocephalic and atraumatic.  Mouth/Throat: Oropharynx is clear and moist.  Eyes: Conjunctivae and EOM are normal. Pupils are equal, round, and reactive to light.  Neck: Normal range of motion. Neck supple. No JVD present. No thyromegaly present.  Cardiovascular: Normal rate, regular rhythm, normal heart sounds and intact distal pulses.   Pulmonary/Chest: Effort normal and breath sounds normal. No respiratory distress. She has no wheezes.  Abdominal: Soft. Bowel sounds are normal.       No suprapubic tenderness    Musculoskeletal: She exhibits tenderness. She exhibits no edema.       Mild spinal process tenderness at L4-L5 More tender in L peri lumbar musculature with spasm Also in piriformis area  SLR pos for buttock pain in L  Gait is slow and labored Nl rom hips   Lymphadenopathy:    She has no cervical adenopathy.  Neurological: She is alert. She has normal strength and normal reflexes. No sensory deficit. Coordination normal.  Skin: Skin is warm and dry. No rash noted. No erythema. No pallor.  Psychiatric: She has a normal mood and affect.          Assessment & Plan:

## 2011-04-09 ENCOUNTER — Telehealth: Payer: Self-pay | Admitting: Family Medicine

## 2011-04-09 MED ORDER — RAMIPRIL 5 MG PO CAPS: 5.0000 mg | ORAL_CAPSULE | Freq: Every day | ORAL | Status: DC

## 2011-04-09 NOTE — Telephone Encounter (Signed)
Tina Berger is better but still just a bit high  I want to cut her altace dose since this can cause high Tina Berger  We disc that poss at her visit Px written for call in   Re check Tina Berger level in 2 weeks please  We will recheck her bp when she comes back for her PE

## 2011-04-09 NOTE — Telephone Encounter (Signed)
Advised pt of lab results.  She doesn't want to schedule lab appt right now, she is out of town of vacation and will call back.

## 2011-04-09 NOTE — Telephone Encounter (Signed)
Message left for patient to return call.

## 2011-04-12 ENCOUNTER — Other Ambulatory Visit: Payer: Self-pay | Admitting: Family Medicine

## 2011-04-12 NOTE — Telephone Encounter (Signed)
Medication ramipril 5mg  called to [facility]. Patient notified as instructed by telephone.

## 2011-04-12 NOTE — Telephone Encounter (Signed)
Message copied by Patience Musca on Mon Apr 12, 2011 11:55 AM ------      Message from: Roxy Manns A      Created: Fri Apr 09, 2011  2:58 PM      Regarding: FW: riyanshi wahab      Contact: [phone]                   ----- Message -----         From: Wyatt Portela         Sent: 04/07/2011   3:53 PM           To: Roxy Manns, MD, Sydell Axon, LPN      Subject: narda Cohenour                                             Ms. Rockhold said you all were going to call her next week with her lab results she wants you to call her on her husbands cell phone number [phone]      Thanks      Zella Ball

## 2011-04-12 NOTE — Telephone Encounter (Signed)
Message copied by Patience Musca on Mon Apr 12, 2011 12:21 PM ------      Message from: LAWS, REGINA C      Created: Wed Apr 07, 2011  5:49 PM      Regarding: FW: Emori Washburn      Contact: [phone]                   ----- Message -----         From: Wyatt Portela         Sent: 04/07/2011   3:53 PM           To: Roxy Manns, MD, Sydell Axon, LPN      Subject: alicha Bisesi                                             Ms. Woodford said you all were going to call her next week with her lab results she wants you to call her on her husbands cell phone number [phone]      Thanks      Zella Ball

## 2011-04-12 NOTE — Telephone Encounter (Signed)
Ramipril 5mg  rx was called in to [facility]. as instructed.Patient notified as instructed by telephone.

## 2011-04-12 NOTE — Telephone Encounter (Signed)
Jacki Cones has already contacted pt on 04/09/11.

## 2011-04-12 NOTE — Telephone Encounter (Signed)
Levothyroxine sent electronically to [facility].

## 2011-04-23 NOTE — Telephone Encounter (Signed)
Pt did have potassium rechecked at 6/20 visit.

## 2011-04-25 NOTE — Telephone Encounter (Signed)
This was addressed and her ace dose was cut

## 2011-04-27 ENCOUNTER — Ambulatory Visit: Payer: Managed Care, Other (non HMO)

## 2011-04-27 ENCOUNTER — Other Ambulatory Visit: Payer: Self-pay | Admitting: Family Medicine

## 2011-04-27 ENCOUNTER — Other Ambulatory Visit: Payer: Managed Care, Other (non HMO)

## 2011-04-27 DIAGNOSIS — E875 Hyperkalemia: Secondary | ICD-10-CM

## 2011-04-28 ENCOUNTER — Telehealth: Payer: Self-pay | Admitting: *Deleted

## 2011-04-28 ENCOUNTER — Ambulatory Visit (INDEPENDENT_AMBULATORY_CARE_PROVIDER_SITE_OTHER): Payer: Managed Care, Other (non HMO) | Admitting: Family Medicine

## 2011-04-28 ENCOUNTER — Other Ambulatory Visit (INDEPENDENT_AMBULATORY_CARE_PROVIDER_SITE_OTHER): Payer: Managed Care, Other (non HMO) | Admitting: Family Medicine

## 2011-04-28 VITALS — BP 124/83 | HR 85

## 2011-04-28 DIAGNOSIS — R300 Dysuria: Secondary | ICD-10-CM

## 2011-04-28 DIAGNOSIS — N390 Urinary tract infection, site not specified: Secondary | ICD-10-CM

## 2011-04-28 DIAGNOSIS — E875 Hyperkalemia: Secondary | ICD-10-CM

## 2011-04-28 LAB — POCT URINALYSIS DIPSTICK
Bilirubin, UA: NEGATIVE
Ketones, UA: NEGATIVE
pH, UA: 6

## 2011-04-28 MED ORDER — CEFUROXIME AXETIL 250 MG PO TABS: 250.0000 mg | ORAL_TABLET | Freq: Two times a day (BID) | ORAL | Status: AC

## 2011-04-28 NOTE — Telephone Encounter (Signed)
Patient notified as instructed by telephone. Pt said she does not have work schedule with her now and she will call back for appt.

## 2011-04-28 NOTE — Telephone Encounter (Signed)
Heather did nurse visit will await Dr Royden Purl instructions.

## 2011-04-28 NOTE — Telephone Encounter (Signed)
Go ahead and let her leave a urine specimen - for dip and spin (when she is here)  I cannot fit more patients in today  Will call her with results and plan and likely schedule a follow up for exam  If fever/ n/v-- let me know - will need to go to [facility]

## 2011-04-28 NOTE — Telephone Encounter (Signed)
Will try the ceftin  [facility] If side eff stop med immediately Pend cx report  F/u next week for visit and re check

## 2011-04-28 NOTE — Telephone Encounter (Signed)
Pt has nurse and lab appt this afternoon and now she has pressure and pain when urinating and is asking if she can have this checked when she comes in.  We dont have any provider appts available today.

## 2011-04-28 NOTE — Telephone Encounter (Signed)
Urine does look infected - please let pt know  I need to start her on an abx - but she has so many drug allergies and intolerances this is difficult  Please ask her what abx she does tolerate I do want to send this for culture as well  Urine spin showed 3-4 wbc and 1-3 rbc and had odor (labeled the form to scan)

## 2011-04-28 NOTE — Telephone Encounter (Signed)
Patient notified as instructed by telephone. Pt said saw allergist and had drug challenge and allergist said Macrobid was not a true allergic reaction just slight rash and itchiness. Pt said last year at [facility] pt received Rocephin and tolerated OK. Pt understands that Ceftin is in Rocephin family and thinks she could tolerate that even though years ago pt took Ceftin and had leg swelling. [facility] [address]. Pt wants call back when med called to pharmacy. [phone].

## 2011-04-29 NOTE — Progress Notes (Signed)
  Subjective:    Patient ID: Tina Berger, female    DOB: 08/25/1951, 60 y.o.   MRN: [id number]  HPI Having uti symptoms and was in office for labs Is allergic to many abx   Review of Systems     Objective:   Physical Exam        Assessment & Plan:

## 2011-04-30 LAB — URINE CULTURE

## 2011-05-02 ENCOUNTER — Other Ambulatory Visit: Payer: Self-pay | Admitting: Family Medicine

## 2011-05-03 ENCOUNTER — Telehealth: Payer: Self-pay

## 2011-05-03 NOTE — Telephone Encounter (Signed)
Ok sounds like a good plan.

## 2011-05-03 NOTE — Telephone Encounter (Signed)
[facility] electronically sent refill request for Flexeril 10mg  takien 1/2 -1 tab by mouth once daily and 1 tab nightly at bedtime. #180 requested. Last refilled 11/09/10 #180 with 1 addl refill. Please advise.

## 2011-05-03 NOTE — Telephone Encounter (Signed)
Will send electronically.

## 2011-05-03 NOTE — Telephone Encounter (Signed)
Patient notified as instructed by telephone. Pt's symptoms have improved. Pt is still having some burning upon urination but it is much better. Pt said if all symptoms are not gone when she finishes her antibiotic she will call back and let Dr Milinda Antis know.

## 2011-05-03 NOTE — Telephone Encounter (Signed)
Message copied by Patience Musca on Mon May 03, 2011  9:22 AM ------      Message from: Roxy Manns A      Created: Sun May 02, 2011 11:51 AM       Urine culture is indeterminate - suggests multiple bacteria       Let me know if symptoms are improved on present abx

## 2011-05-13 ENCOUNTER — Ambulatory Visit (INDEPENDENT_AMBULATORY_CARE_PROVIDER_SITE_OTHER): Payer: Managed Care, Other (non HMO) | Admitting: Family Medicine

## 2011-05-13 ENCOUNTER — Encounter: Payer: Self-pay | Admitting: Family Medicine

## 2011-05-13 VITALS — BP 132/78 | HR 64 | Temp 98.6°F | Wt 204.8 lb

## 2011-05-13 DIAGNOSIS — R350 Frequency of micturition: Secondary | ICD-10-CM

## 2011-05-13 LAB — POCT URINALYSIS DIPSTICK
Bilirubin, UA: NEGATIVE
Ketones, UA: NEGATIVE
Nitrite, UA: NEGATIVE
Protein, UA: NEGATIVE
pH, UA: 8

## 2011-05-13 MED ORDER — CEPHALEXIN 500 MG PO CAPS: 500.0000 mg | ORAL_CAPSULE | Freq: Two times a day (BID) | ORAL | Status: AC

## 2011-05-13 NOTE — Assessment & Plan Note (Signed)
Frequency with urethral spasm at end of stream. UA - concerning for infection again, sent in culture and will treat with longer course of keflex. Return if sxs not improved, consider pelvic exam vs uro referral.

## 2011-05-13 NOTE — Patient Instructions (Signed)
Looks like repeat infection. I have sent urine for culture again. Will treat with longer course of antibiotic - keflex twice daily for 10 days. If not better, let us know. Push fluids and plenty of rest. May try cranberry juice or tablets, especially while having symptoms.

## 2011-05-13 NOTE — Progress Notes (Signed)
  Subjective:    Patient ID: Tina Berger, female    DOB: 18-Jan-1951, 60 y.o.   MRN: [id number]  HPI CC: ? UTI  Complicated pt with mult drug allergies and abx allergies new to me presents with:  2 wks ago had UTI sxs, UA looked infected, treated with 5d of ceftin.  Culture returned inconclusive.  Advised if had any more sxs, should come in.  Off and on sxs, not too sure, then this am had strong pain spasm, so came in.  Mainly at end of stream feels strong spasm of urethra.  No dysuria or urgency, mild frequency.  No abd pain, nausea, vomiting, fevers/chills.  Has not seen urologist in past.  Has had several UTIs in past.  Drinks plenty of water.  Has not been taking cranberry juice /tablets recently.  States saw allergist, told macrobid not true allergy, would be willing to try in future.  Review of Systems Per HPI    Objective:   Physical Exam  Nursing note and vitals reviewed. Constitutional: She appears well-developed and well-nourished. No distress.  HENT:  Head: Normocephalic and atraumatic.  Mouth/Throat: Oropharynx is clear and moist. No oropharyngeal exudate.  Eyes: Conjunctivae and EOM are normal. Pupils are equal, round, and reactive to light. No scleral icterus.  Neck: Normal range of motion. Neck supple.  Cardiovascular: Normal rate, regular rhythm, normal heart sounds and intact distal pulses.   No murmur heard. Pulmonary/Chest: Effort normal and breath sounds normal. No respiratory distress. She has no wheezes. She has no rales.  Abdominal: Soft. Bowel sounds are normal. She exhibits no distension. There is no tenderness. There is no rebound, no guarding and no CVA tenderness.  Musculoskeletal: She exhibits no edema.  Skin: Skin is warm and dry. No rash noted.          Assessment & Plan:

## 2011-05-15 LAB — URINE CULTURE

## 2011-05-21 ENCOUNTER — Telehealth: Payer: Self-pay | Admitting: *Deleted

## 2011-05-21 NOTE — Telephone Encounter (Signed)
Ask her how her urinary symptoms are please and let me know Stop the keflex please

## 2011-05-21 NOTE — Telephone Encounter (Signed)
Just stay off med and update if hives worsen/ return or uti symptoms return Please note keflex allergy on chart

## 2011-05-21 NOTE — Telephone Encounter (Signed)
Patient notified as instructed by telephone.Keflex added to allergies.

## 2011-05-21 NOTE — Telephone Encounter (Signed)
Pt was seen on 7/26 for UTI and given keflex.  She started getting rashy on Monday, didn't take the medicine on Monday but started back and now today she has hives.  Advised pt not to take the keflex, can take benedryl- which she says she has been taking all along because she always has problems with antibiotics.  She was to take keflex for 2 more days.  Please advise.

## 2011-05-21 NOTE — Telephone Encounter (Signed)
Patient notified as instructed by telephone. Pt said no burning or pain when urinates. When pt finishes voiding has slight spasm but much better than before. Pt said her feet and legs were swollen but after she took the Benadryl the swelling is better. [facility] needed and pt is at home this after noon if need to call back [phone]

## 2011-05-22 ENCOUNTER — Ambulatory Visit (INDEPENDENT_AMBULATORY_CARE_PROVIDER_SITE_OTHER): Payer: Managed Care, Other (non HMO) | Admitting: Family Medicine

## 2011-05-22 ENCOUNTER — Encounter: Payer: Self-pay | Admitting: Family Medicine

## 2011-05-22 VITALS — BP 136/82 | HR 104 | Temp 98.5°F | Wt 202.8 lb

## 2011-05-22 DIAGNOSIS — Z888 Allergy status to other drugs, medicaments and biological substances status: Secondary | ICD-10-CM

## 2011-05-22 DIAGNOSIS — L270 Generalized skin eruption due to drugs and medicaments taken internally: Secondary | ICD-10-CM

## 2011-05-22 DIAGNOSIS — T7840XA Allergy, unspecified, initial encounter: Secondary | ICD-10-CM

## 2011-05-22 HISTORY — DX: Generalized skin eruption due to drugs and medicaments taken internally: L27.0

## 2011-05-22 MED ORDER — METHYLPREDNISOLONE ACETATE 80 MG/ML IJ SUSP
80.0000 mg | Freq: Once | INTRAMUSCULAR | Status: AC
  Administered 2011-05-22: 80 mg via INTRAMUSCULAR

## 2011-05-22 MED ORDER — PREDNISONE 20 MG PO TABS: 40.0000 mg | ORAL_TABLET | Freq: Every day | ORAL | Status: DC

## 2011-05-22 NOTE — Patient Instructions (Addendum)
Reaction to keflex. Shot of steroid, then take 10 day course of prednisone. May continue benadryl Update [place] if not improving as expected. If any worsening of rash - shortness of breath, nausea, chest pain, or swelling of throat, please go to ER to be evaluated.  Drug Rash Skin reactions can be caused by several different drugs. Allergy to the medicine can cause itching, hives, and other rashes. Sun exposure causes a red rash with some medicines. Mononucleosis virus can cause a similar red rash when you are taking antibiotics. Sometimes the rash may be accompanied by pain. The drug rash may happen with new drugs or with medications that you have been taking for a while. The rash is not contagious (cannot pass it on to another person). In most cases the symptoms of a drug rash are gone within a few days of stopping the medicine. Your rash (including urticaria, hives) is most likely from the following medicines:  Antibiotics/antimicrobials.   Anticonvulsants/seizure medications.   Antihypertensives/blood pressure medications.   Anitmalarials.   Antidepressants/depression medications.   Antianxiety drugs.   Diuretics/water pills.   Nonsteroidal anti-inflammatory drugs.   Simvastatin.   Lithium.   Omeprazole.   Allopurinol.   Pseudoephedrine.   Amiodarone.   Packed red blood cells (when you get a blood transfusion).   Contrast media (for example when getting a CT scan).  This drug list is not all inclusive, but drug rashes have been reported with all the medications listed above. Your caregiver will tell you which medicines to avoid. If you react to a medicine, a similar or worse reaction can occur the next time you take it. If you have to stop taking an antibiotic because of a drug rash, an alternative antibiotic may be needed to get rid of your infection. Antihistamine or cortisone drugs may be prescribed to help relieve your symptoms. Stay out of the sun until the rash is  completely gone.  Be sure to let your caregiver know about your drug reaction. Do not take this medicine in the future. Call your caregiver if your drug rash does not improve within 3 to 4 days. SEEK IMMEDIATE MEDICAL CARE IF:  You develop breathing problems, swelling in the throat, or wheezing.   You have weakness, fainting, fever, and muscle or joint pains.   You develop blisters or peeling of skin, especially around the mouth.  Document Released: 11/11/2004 Document Re-Released: 03/24/2010 [facility] Patient Information 2011 [place], [place].

## 2011-05-22 NOTE — Progress Notes (Signed)
  Subjective:    Patient ID: Tina Berger, female    DOB: 02-01-1951, 60 y.o.   MRN: [id number]  HPI CC: abx reaction  Seen by myself 05/13/2011 with UTI, treated with keflex.  Had been taking for 1 wk.  7 days of keflex bid.  Yesterday morning had small rash left arm.  Later on started having rash throughout body, seems to be moving up body.  Feeling throat tight.  More anxious as well.   Itchy  No SOB, trouble breathing, abd pain, n/v.  No spots inside mouth.  Only other difference is having hair colored Thursday night.  H/o photosensitivity in past.  No new lotions, shampoos, soaps, detergents.  Has been taking benadryl, tried some cortisone cream as well.  Review of Systems Per HPI    Objective:   Physical Exam  Vitals reviewed. Constitutional: She appears well-developed and well-nourished. No distress.  HENT:  Mouth/Throat: Oropharynx is clear and moist. No oropharyngeal exudate.  Eyes: Conjunctivae and EOM are normal. Pupils are equal, round, and reactive to light.  Neck: Normal range of motion. Neck supple.  Cardiovascular: Normal rate, regular rhythm, normal heart sounds and intact distal pulses.   No murmur heard. Pulmonary/Chest: Effort normal and breath sounds normal. No respiratory distress. She has no wheezes. She has no rales.  Skin: Skin is warm and dry. Rash noted.          Diffuse maculopapular rash throughout body, concentrated on arms/legs as well as back, some spread to face/head.  Confluence of rash into erythematous plaque right medial elbow No oral lesions.          Assessment & Plan:

## 2011-05-22 NOTE — Assessment & Plan Note (Addendum)
Anticipate drug reaction to keflex, have added to allergies. Given seems to be spreading and pt endorses pruritis, treat with shot of steroid as well as short course of steroids.  Update [place] if not improving as expected.

## 2011-05-24 ENCOUNTER — Telehealth: Payer: Self-pay | Admitting: *Deleted

## 2011-05-24 NOTE — Telephone Encounter (Signed)
Triage Record Num: [id number] Operator: Lodema Pilot Patient Name: Tina Berger Call Date & Time: 05/22/2011 9:00:46AM Patient Phone: [phone] PCP: [name]. Tower Patient Gender: Female PCP Fax : Patient DOB: 1951-02-26 Practice Name: [facility] Reason for Call: Tina Berger is calling about allergic reaction. Keflex was given on 05/13/11 for UTI. Pt stopped taking Keflex 05/21/11 AM. Rash - chin to chest, bilateral arms, back, bilateral legs. Hives inside right arm Onset 05/21/11. Itching. Per Allergic Reaction Protocol, adviced pt to [facility]. Care Advice given. Protocol(s) Used: Allergic Reaction, Severe Recommended Outcome per Protocol: Call Provider within 4 Hours Reason for Outcome: First occurrence of mild allergic reaction (rash; hives; itching; nasal congestion; watery, red eyes) that occurs within minutes to several hours after exposure to an allergen AND without any other signs or symptoms of anaphylaxis. Care Advice: ~ Call provider if symptoms worsen or new symptoms develop. ~ Apply cool compresses for 15 to 20 minutes 4 to 6 times a day to relieve discomfort. ~ Wear loose-fitting, non-restricting clothing. Cool/tepid showers or baths may help relieve itching. If cool water alone does not relieve itching, try adding 1/2 to 1 cup baking soda or colloidal oatmeal (Aveeno) to bath water. ~ ~ HEALTH PROMOTION / MAINTENANCE ~ Rash from a drug reaction may not appear for a week or more after a drug was started. Call provider if rash gets worse after offending drug has been discontinued, or if it continues for more than a week with no improvement. ~ ~ Speak with provider before next dose of medication is due. ~ SYMPTOM / CONDITION MANAGEMENT ~ Should not be alone for the next 24 hours in case symptoms worsen. Avoid allergy triggers that have caused any reaction. Read labels on food or medications carefully; ask about ingredients in food when eating out. If allergic  to stinging insects, wear long-sleeved shirts and trousers and closed toe shoes. Do not walk barefoot. Ask healthcare provider about carrying emergency allergy medication. ~ ~ IMMEDIATE ACTION ~ CAUTIONS If an allergy is identified, tell all healthcare providers of your allergy. Even if a first-time reaction caused mild symptoms, a future response to the same allergen may cause more serious symptoms. Wear medical identification to alert others in case of an emergency. ~ Medication Advice: - Discontinue all nonprescription and alternative medications, especially stimulants, until evaluated by provider. - Take prescribed medications as directed, following label instructions for the medication. - Do not change medications or dosing regimen until provider is consulted. - Know possible side effects of medication and what to do if they occur. - Tell provider all prescription, nonprescription or alternative medications that you take ~ Call EMS 911 if develop signs and symptoms of anaphylaxis within minutes to several hours of exposure: severe difficulty breathing; rapid, weak or irregular pulse; pruritus, urticaria, swelling of face, lips, tongue, or throat causing ~ 05/22/2011 9:43:12AM Page 1 of 2 [id number] [facility]-A-[facility]urse Triage Call Report Patient Name: Tina Berger continuation page/s tightness or difficulty swallowing; abdominal cramping, nausea, vomiting or diarrhea. If immediately available, consider taking an appropriate dose of a nonprescription antihistamine (e.g., Benadryl) orally as directed by the label or a pharmacist. These medications may cause drowsiness and should be taken with caution by adults 75 years or older. ~ 08/

## 2011-05-24 NOTE — Telephone Encounter (Signed)
Patient notified that she will probably need to give the steroid more time to get in her system as she has only had 1 day of it. Advised her to increase it to 3 pills today and tomorrow and then decrease back down to 2 pills for the remainder of the course. I instructed her to take tylenol for the pain and if that didn't help or if she was no better tomorrow to call me back for an appointment. Red flags discussed with patient and she will go to ER if she experiences any of those.

## 2011-05-24 NOTE — Telephone Encounter (Signed)
Patient called and said her rash hasn't improved much at all. She says she is still breaking out even with taking the prednisone and benadryl. She says it is very painful and itching. She says her joints are now painful as well. She says it will seem to get better for a short time and then flare back up worse than it was. Do you have any other suggestions for her?

## 2011-05-24 NOTE — Telephone Encounter (Signed)
Patient says she saw Dr. Sharen Hones on Saturday and was given medication that she had a severe reaction to.  She was given medication to counteract the reaction but she is not much better now with the hives.  Please advise.

## 2011-05-24 NOTE — Telephone Encounter (Addendum)
Will likely need more time to resolve. Continue benadryl.  May increase prednisone to 3 pills/day for 2 days then back down to 2 pills daily for remainder of course If not improving by tomorrow, could come in to be seen. If at any point worsening nausea, oral lesions, fever, or trouble breathing, throat tightening, will need to seek urgent medical care. ? Drug-induced vasculitis, already on steroids.

## 2011-06-02 ENCOUNTER — Telehealth: Payer: Self-pay | Admitting: *Deleted

## 2011-06-02 DIAGNOSIS — N390 Urinary tract infection, site not specified: Secondary | ICD-10-CM

## 2011-06-02 DIAGNOSIS — R350 Frequency of micturition: Secondary | ICD-10-CM

## 2011-06-02 NOTE — Telephone Encounter (Signed)
Patient notified as instructed by telephone. Pt would like urology referral. Pt will go to [facility] or [place] wherever you suggest for the best urologist. Pt will wait to hear from pt care coordinator. Pt can be reached at [phone].

## 2011-06-02 NOTE — Telephone Encounter (Signed)
Will refer to urologist

## 2011-06-02 NOTE — Telephone Encounter (Signed)
I would say urologist given the difficulty treating her (with multiple abx reactions) Let me know if she needs a ref

## 2011-06-02 NOTE — Telephone Encounter (Signed)
Patient says she has recurrent UTI's and just recently had a severe reaction to an ABX.  However, now her sx have returned.  Does she need an OV here or with a Urologist?

## 2011-06-28 ENCOUNTER — Encounter: Payer: Managed Care, Other (non HMO) | Admitting: Family Medicine

## 2011-07-05 ENCOUNTER — Encounter: Payer: Managed Care, Other (non HMO) | Admitting: Family Medicine

## 2011-07-20 ENCOUNTER — Other Ambulatory Visit: Payer: Self-pay | Admitting: Cardiovascular Disease

## 2011-07-20 NOTE — Telephone Encounter (Signed)
LMOM to call office regarding refill.

## 2011-07-26 ENCOUNTER — Telehealth: Payer: Self-pay

## 2011-07-26 MED ORDER — METOPROLOL SUCCINATE ER 50 MG PO TB24: 50.0000 mg | ORAL_TABLET | Freq: Every day | ORAL | Status: DC

## 2011-07-26 NOTE — Telephone Encounter (Signed)
Patient has a follow up appt with Dr. Mariah Milling on Oct. 12, 2012

## 2011-07-29 ENCOUNTER — Encounter: Payer: Self-pay | Admitting: Cardiovascular Disease

## 2011-07-30 ENCOUNTER — Encounter: Payer: Self-pay | Admitting: Cardiovascular Disease

## 2011-07-30 ENCOUNTER — Ambulatory Visit (INDEPENDENT_AMBULATORY_CARE_PROVIDER_SITE_OTHER): Payer: Managed Care, Other (non HMO) | Admitting: Cardiovascular Disease

## 2011-07-30 VITALS — BP 161/101 | HR 80 | Ht 63.0 in | Wt 204.0 lb

## 2011-07-30 DIAGNOSIS — I10 Essential (primary) hypertension: Secondary | ICD-10-CM | POA: Insufficient documentation

## 2011-07-30 DIAGNOSIS — I7300 Raynaud's syndrome without gangrene: Secondary | ICD-10-CM | POA: Insufficient documentation

## 2011-07-30 DIAGNOSIS — E669 Obesity, unspecified: Secondary | ICD-10-CM

## 2011-07-30 MED ORDER — OLMESARTAN MEDOXOMIL-HCTZ 40-25 MG PO TABS: 1.0000 | ORAL_TABLET | Freq: Every day | ORAL | Status: DC

## 2011-07-30 MED ORDER — METOPROLOL SUCCINATE ER 50 MG PO TB24: 50.0000 mg | ORAL_TABLET | Freq: Every day | ORAL | Status: DC

## 2011-07-30 MED ORDER — DILTIAZEM HCL 30 MG PO TABS: 30.0000 mg | ORAL_TABLET | Freq: Three times a day (TID) | ORAL | Status: DC | PRN

## 2011-07-30 NOTE — Assessment & Plan Note (Signed)
She reports that her blood pressure has been persistently elevated recently. She has not tried many medications in the past. I am reluctant to try other calcium channel blockers such as amlodipine as she does report a history of lower extremity edema with Cardizem. She does have trace edema on today's visit. I suggested we try an ARB HCTZ combination such as Benicar HCT 40/25 mg. If this works, with a lab check in several weeks' time showing stable potassium, we could always change this to a generic form such as losartan HCT or Diovan HCT. I did give her a coupon card for Benicar HCT.  Additional medications that we could try include clonidine or clonidine patch.

## 2011-07-30 NOTE — Assessment & Plan Note (Signed)
We have encouraged continued exercise, careful diet management in an effort to lose weight. 

## 2011-07-30 NOTE — Progress Notes (Signed)
Patient ID: Tina Berger, female    DOB: [birth year], 60 y.o.   MRN: [id number]  HPI Comments: Tina Berger is a 60 year old woman with a history of Raynaud's disease, hyperlipidemia, HTN, obesity, chronic lower extremity swelling who presents for Followup of her palpitations and hypertension.  She reports that on the metoprolol, her palpitations have been well-controlled. Her ramapril was cut in half because of potassium abnormalities.  Since then, her blood pressure has been very elevated. She states that her weight has been slowly climbing. The metoprolol and ramapril up real do not seem adequate enough and she reports systolic pressures in the 160 range   Echocardiogram in October 2008 was essentially normal.   Stress test in October 2008 showed no ischemia. This is a exercise treadmill. She exercised for 4 minutes 30 seconds, peak heart rate of 152 beats per minute, peak blood pressure 172/94.   Old EKG shows normal sinus rhythm with a rate of 91 beats per minute, no significant ST or T wave changes.      Outpatient Encounter Prescriptions as of 07/30/2011  Medication Sig Dispense Refill  . buPROPion (WELLBUTRIN XL) 150 MG 24 hr tablet Take 1 tablet by mouth Daily.        Marland Kitchen CARDIZEM LA 120 MG 24 hr tablet TAKE 1 TABLET ONCE A DAY  90 each  0  . cyclobenzaprine (FLEXERIL) 10 MG tablet        . DIGESTIVE ENZYMES PO Take by mouth 2 (two) times daily.        . diphenhydrAMINE (BENADRYL) 25 mg capsule Take 25 mg by mouth as needed.        Marland Kitchen esomeprazole (NEXIUM) 40 MG capsule Take 40 mg by mouth 2 (two) times daily.        . Fish Oil-Cholecalciferol (OMEGA-3 FISH OIL/VITAMIN D3) 1000-1000 MG-UNIT CAPS Take by mouth daily.        Marland Kitchen HYDROcodone-acetaminophen (VICODIN) 5-500 MG per tablet Take 1 tablet by mouth every 6 (six) hours as needed for pain (watch out for sedation ).  30 tablet  0  . hyoscyamine (LEVSIN SL) 0.125 MG SL tablet Take 1 tablet (0.125 mg total) by mouth every 4 (four) hours  as needed for cramping.  90 tablet  1  . levothyroxine (SYNTHROID, LEVOTHROID) 125 MCG tablet Take 125 mcg by mouth daily.        . Magnesium 400 MG CAPS Take by mouth. Take 3 by mouth daily       . metoprolol (TOPROL-XL) 50 MG 24 hr tablet Take 1 tablet (50 mg total) by mouth daily.  90 tablet  4  . mometasone (NASONEX) 50 MCG/ACT nasal spray Place 1 spray into the nose daily.        . Naproxen Sodium (ALEVE) 220 MG CAPS Take by mouth. Take up to 4 by mouth daily       . Probiotic Product (PROBIOTIC FORMULA PO) Take by mouth daily.        . ramipril (ALTACE) 5 MG capsule Take 1 capsule (5 mg total) by mouth daily.  30 capsule  11  . ranitidine (ZANTAC) 300 MG tablet Take 300 mg by mouth daily.        Marland Kitchen DISCONTD: cyclobenzaprine (FLEXERIL) 10 MG tablet TAKE ONE-HALF TO ONE TABLET BY MOUTH ONCE DAILY AND TAKE ONE TABLETNIGHTLY AT BEDTIME  180 tablet  1  . olmesartan-hydrochlorothiazide (BENICAR HCT) 40-25 MG per tablet Take 1 tablet by mouth daily.  30 tablet  6     Review of Systems  Constitutional: Negative.   HENT: Negative.   Eyes: Negative.   Respiratory: Negative.   Cardiovascular: Negative.   Gastrointestinal: Negative.   Musculoskeletal: Negative.   Skin: Negative.   Neurological: Negative.   Hematological: Negative.   Psychiatric/Behavioral: Negative.   All other systems reviewed and are negative.    BP 161/101  Pulse 80  Ht 5\' 3"  (1.6 m)  Wt 204 lb (92.534 kg)  BMI 36.14 kg/m2  Physical Exam  Nursing note and vitals reviewed. Constitutional: She is oriented to person, place, and time. She appears well-developed and well-nourished.  HENT:  Head: Normocephalic.  Nose: Nose normal.  Mouth/Throat: Oropharynx is clear and moist.  Eyes: Conjunctivae are normal. Pupils are equal, round, and reactive to light.  Neck: Normal range of motion. Neck supple. No JVD present.  Cardiovascular: Normal rate, regular rhythm, S1 normal, S2 normal, normal heart sounds and intact  distal pulses.  Exam reveals no gallop and no friction rub.   No murmur heard. Pulmonary/Chest: Effort normal and breath sounds normal. No respiratory distress. She has no wheezes. She has no rales. She exhibits no tenderness.  Abdominal: Soft. Bowel sounds are normal. She exhibits no distension. There is no tenderness.  Musculoskeletal: Normal range of motion. She exhibits no edema and no tenderness.  Lymphadenopathy:    She has no cervical adenopathy.  Neurological: She is alert and oriented to person, place, and time. Coordination normal.  Skin: Skin is warm and dry. No rash noted. No erythema.  Psychiatric: She has a normal mood and affect. Her behavior is normal. Judgment and thought content normal.         Assessment and Plan

## 2011-07-30 NOTE — Patient Instructions (Signed)
You are doing well. Please start benicar HCT 40/25 once a day in the Am for blood pressure (start a 1/2 pill for the first week) Continue the ramapril for now. We should check potassium level in two weeks Take the diltiazem 30 mg as needed for raynauds symptoms Monitor your blood pressure Please call us if you have new issues that need to be addressed before your next appt.  We will call you for a follow up Appt. In 3 months

## 2011-07-30 NOTE — Assessment & Plan Note (Signed)
She does report a history of Raynaud's. She takes Cardizem periodically but does report lower extremity edema. We have suggested she could try generic diltiazem 30 mg p.r.n. For episodic pain.

## 2011-08-05 ENCOUNTER — Telehealth: Payer: Self-pay | Admitting: *Deleted

## 2011-08-05 MED ORDER — CLONIDINE HCL 0.1 MG PO TABS: 0.1000 mg | ORAL_TABLET | Freq: Two times a day (BID) | ORAL | Status: DC

## 2011-08-05 NOTE — Telephone Encounter (Signed)
Pt notified, she will stop benicar/hct and start clonidine 0.1 bid.

## 2011-08-05 NOTE — Telephone Encounter (Signed)
Pt called stating she was started on Benicar/HCT 40/25 mg HALF tablet on Friday by TG, and she has had muscle aches all over since. She does have h/o fibromyalgia, but is certain that this pain is from starting the med. She has only been taking 1/2 tablet. Her BP is ~130/60s at this time. I saw on drug info that could cause back pain and can also incr CPK. I told pt to hold for now if in pain, and monitor BP. She still takes toprol 50 and ramipril 5mg , and she will contact office if BP elevated while off benicar prior to hearing back from office. Will forward to Dr. Mariah Milling for rec's.

## 2011-08-05 NOTE — Telephone Encounter (Signed)
We could try clonidine 0.1 mg BID

## 2011-08-05 NOTE — Telephone Encounter (Signed)
Attempted to contact pt, LMOM TCB.  

## 2011-08-10 ENCOUNTER — Other Ambulatory Visit: Payer: Self-pay | Admitting: Family Medicine

## 2011-08-10 NOTE — Telephone Encounter (Signed)
[facility] request refill Levothyroxin 125 mg # 30 x 2.

## 2011-08-18 ENCOUNTER — Encounter: Payer: Self-pay | Admitting: Family Medicine

## 2011-08-18 ENCOUNTER — Ambulatory Visit (INDEPENDENT_AMBULATORY_CARE_PROVIDER_SITE_OTHER): Payer: Managed Care, Other (non HMO) | Admitting: Family Medicine

## 2011-08-18 VITALS — BP 128/84 | HR 84 | Temp 98.0°F | Ht 62.75 in | Wt 205.8 lb

## 2011-08-18 DIAGNOSIS — IMO0001 Reserved for inherently not codable concepts without codable children: Secondary | ICD-10-CM

## 2011-08-18 DIAGNOSIS — Z1231 Encounter for screening mammogram for malignant neoplasm of breast: Secondary | ICD-10-CM

## 2011-08-18 DIAGNOSIS — E039 Hypothyroidism, unspecified: Secondary | ICD-10-CM

## 2011-08-18 DIAGNOSIS — Z0000 Encounter for general adult medical examination without abnormal findings: Secondary | ICD-10-CM

## 2011-08-18 DIAGNOSIS — M549 Dorsalgia, unspecified: Secondary | ICD-10-CM

## 2011-08-18 DIAGNOSIS — I10 Essential (primary) hypertension: Secondary | ICD-10-CM

## 2011-08-18 DIAGNOSIS — E785 Hyperlipidemia, unspecified: Secondary | ICD-10-CM

## 2011-08-18 DIAGNOSIS — M949 Disorder of cartilage, unspecified: Secondary | ICD-10-CM

## 2011-08-18 DIAGNOSIS — M899 Disorder of bone, unspecified: Secondary | ICD-10-CM

## 2011-08-18 DIAGNOSIS — E119 Type 2 diabetes mellitus without complications: Secondary | ICD-10-CM

## 2011-08-18 HISTORY — DX: Encounter for screening mammogram for malignant neoplasm of breast: Z12.31

## 2011-08-18 MED ORDER — DULOXETINE HCL 30 MG PO CPEP: 30.0000 mg | ORAL_CAPSULE | Freq: Every day | ORAL | Status: DC

## 2011-08-18 NOTE — Progress Notes (Signed)
Subjective:    Patient ID: Tina Berger, female    DOB: 1950/10/19, 60 y.o.   MRN: [id number]  HPI Here for annual wellness exam and to review chronic medical problems Is doing pretty well overall   HTn in good control 128/84 Saw Dr Mariah Milling and bp was high 160s/? Gave her benicar hct- 1/2 pill to take with the altace -- and then bp came way down Then 1 week later fibro flared severely -- she called and they told her not to take it  Then given another med- saw allergy to it (clonidine made her swell up)  Did not get it filled  Is back on altace 5 mg  Is also on toprol   Takes cardizem in winter -- for raynauds -- will be taking it in next few months   Wt is up 1 lb with bmi of 36  Supposed to have PE in June and back went out and she had to re schedule it  Is still having hot flashes  No interest in sex - since menopause  Pain level in general is off the chart - chronic back problem  Back went out in august -- saw Dr Shon Baton (ortho)- said there was nothing to do  She will however need neck surg in future Dr Ethelene Hal cannot do any thing more  Wonders if she should go back on cymbalta to help pain more - is interested in it  Has been to pain clinic twice  Cannot find a practice that will accept her ins for accupuncture      DM - Lab Results  Component Value Date   HGBA1C 6.6* 03/31/2011   opthy-within last 6 mo-- ok in may Sugars at home are 100 fasting , and in afternoon 75-99  Is watching her diet - trying  Exercise is difficult due to pain and motivation and fatigue   Lipids Lab Results  Component Value Date   CHOL 238* 03/31/2011   CHOL 224* 11/24/2009   CHOL 249* 08/01/2009   Lab Results  Component Value Date   HDL 54.90 03/31/2011   HDL 40.98 11/24/2009   HDL 56.50 08/01/2009   Lab Results  Component Value Date   LDLCALC 122* 03/25/2008   Lab Results  Component Value Date   TRIG 113.0 03/31/2011   TRIG 77.0 11/24/2009   TRIG 115.0 08/01/2009   Lab Results    Component Value Date   CHOLHDL 4 03/31/2011   CHOLHDL 4 11/24/2009   CHOLHDL 4 08/01/2009   Lab Results  Component Value Date   LDLDIRECT 158.9 03/31/2011   LDLDIRECT 146.1 11/24/2009   LDLDIRECT 163.3 08/01/2009   does watch satfats in diet -- vegetarian diet  With some fish  Cut out cheese and salad dressings Statins gave her pain in the past   Hypothyroid Lab Results  Component Value Date   TSH 1.95 03/31/2011   nl Clinically no changes   Last pap- was 2 years ago  No abn paps  No new sexual partners  No problems   Zoster status- has not had vaccine or shingles   Mammogram ?09 Has not had another  Goes to [facility]  Flu shot - had it insept  Td 2/05  colonosc 3/11 with tics  Osteopenia dexa ? 05-- she can get that done at work   Patient Active Problem List  Diagnoses  . HYPOTHYROIDISM  . DIABETES MELLITUS, TYPE II  . UNSPECIFIED VITAMIN D DEFICIENCY  . HYPERLIPIDEMIA  . ANEMIA-IRON DEFICIENCY  .  ANXIETY  . PANIC ATTACK  . DEPRESSION  . KERATOCONJUNCTIVITIS SICCA  . HYPERTENSION  . MITRAL VALVE PROLAPSE  . ABNORMAL HEART RHYTHMS  . RAYNAUD'S SYNDROME  . ESOPHAGITIS  . GERD  . GASTRITIS  . IBS  . UTI  . ROSACEA  . OSTEOARTHRITIS  . BACK PAIN  . PLANTAR FASCIITIS  . FIBROMYALGIA  . OSTEOPENIA  . SOMATIC DYSFUNCTION  . SYMPTOM, EDEMA  . PALPITATIONS  . MIGRAINES, HX OF  . PERSONAL HISTORY ALLERGY UNSPEC MEDICINAL AGENT  . Routine general medical examination at a health care facility  . Hyperkalemia  . Urine frequency  . Drug rash  . HTN (hypertension)  . Raynaud disease  . Obesity  . Other screening mammogram   Past Medical History  Diagnosis Date  . GERD (gastroesophageal reflux disease)   . HLD (hyperlipidemia)   . HTN (hypertension)   . Hypothyroidism   . Osteoarthritis     hands  . Anemia, iron deficiency   . Diabetes mellitus type II     Diet controlled  . Edema   . Raynaud's disease   . Fibromyalgia   . Dry eyes   . Recurrent  HSV (herpes simplex virus)     lesions in nose  . HNP (herniated nucleus pulposus) 2/99    T6,7,8 with DJD  . DJD (degenerative joint disease)   . Cervical dysplasia     abnormal paps  . Left ovarian cyst     x 3, rupture  . Osteopenia     mild-11/01; improved 12/05  . Diverticulosis   . Hemorrhoids    Past Surgical History  Procedure Date  . Hand surgery   . Thumb joint repair   . Colonoscopy 11/01    Diverticulosis; hemorrhoids   History  Substance Use Topics  . Smoking status: Former Games developer  . Smokeless tobacco: Never Used   Comment: quit over 40 years  . Alcohol Use: Yes     very rarely   Family History  Problem Relation Age of Onset  . Emphysema Father     + smoker  . Lung cancer Mother     + smoker  . Coronary artery disease Mother     relatively young  . Lymphoma Brother   . Lymphoma Sister   . Diabetes Brother   . Diabetes Sister   . Heart disease Brother    Allergies  Allergen Reactions  . Amitriptyline Hcl     REACTION: sedating  . Atorvastatin     REACTION: muscle  . Benicar (Olmesartan Medoxomil)     Muscle pain   . Cetirizine Hcl     REACTION: headache  . Ciprofloxacin     REACTION: ? rash vs sun rxn  . Clonidine Derivatives     Swelling   . Diltiazem Hcl     REACTION: reaction not known  . Erythromycin     REACTION: reaction not known  . Etodolac     REACTION: reaction not known  . Fluoxetine Hcl     REACTION: stomach problems  . Furosemide     REACTION: swelling  . Gabapentin     REACTION: edema  . Naproxen Sodium     REACTION: edema  . Paroxetine     REACTION: weight gain  . Penicillins     REACTION: reactions to all derivitives  . Pregabalin     REACTION: swelling  . Sulfonamide Derivatives     REACTION: reaction not known  . Tetracycline  REACTION: reaction not known  . Torsemide     REACTION: swelling  . Venlafaxine     REACTION: sweating  . Keflex Hives and Rash   Current Outpatient Prescriptions on File  Prior to Visit  Medication Sig Dispense Refill  . buPROPion (WELLBUTRIN XL) 150 MG 24 hr tablet Take 1 tablet by mouth Daily.        . cyclobenzaprine (FLEXERIL) 10 MG tablet 1/2 tablet by mouth in AM and 1-2 tablets by mouth in PM      . DIGESTIVE ENZYMES PO Take by mouth 2 (two) times daily.        . diphenhydrAMINE (BENADRYL) 25 mg capsule Take 25 mg by mouth as needed.        Marland Kitchen esomeprazole (NEXIUM) 40 MG capsule Take 40 mg by mouth 2 (two) times daily.        . Fish Oil-Cholecalciferol (OMEGA-3 FISH OIL/VITAMIN D3) 1000-1000 MG-UNIT CAPS Take by mouth daily.        Marland Kitchen HYDROcodone-acetaminophen (VICODIN) 5-500 MG per tablet Take 1 tablet by mouth every 6 (six) hours as needed for pain (watch out for sedation ).  30 tablet  0  . levothyroxine (SYNTHROID, LEVOTHROID) 125 MCG tablet Take 125 mcg by mouth daily.        . Magnesium 400 MG CAPS Take by mouth. Take 3 by mouth daily       . metoprolol (TOPROL-XL) 50 MG 24 hr tablet Take 1 tablet (50 mg total) by mouth daily.  90 tablet  4  . Naproxen Sodium (ALEVE) 220 MG CAPS Take by mouth. Take up to 4 by mouth daily       . Probiotic Product (PROBIOTIC FORMULA PO) Take by mouth daily.        . ramipril (ALTACE) 5 MG capsule Take 1 capsule (5 mg total) by mouth daily.  30 capsule  11  . ranitidine (ZANTAC) 300 MG tablet Take 300 mg by mouth daily.        Marland Kitchen CARDIZEM LA 120 MG 24 hr tablet TAKE 1 TABLET ONCE A DAY  90 each  0  . diltiazem (CARDIZEM) 30 MG tablet Take 1 tablet (30 mg total) by mouth 3 (three) times daily as needed.  90 tablet  11  . hyoscyamine (LEVSIN SL) 0.125 MG SL tablet Take 1 tablet (0.125 mg total) by mouth every 4 (four) hours as needed for cramping.  90 tablet  1  . mometasone (NASONEX) 50 MCG/ACT nasal spray Place 1 spray into the nose daily.            Review of Systems Patient Active Problem List  Diagnoses  . HYPOTHYROIDISM  . DIABETES MELLITUS, TYPE II  . UNSPECIFIED VITAMIN D DEFICIENCY  . HYPERLIPIDEMIA  .  ANEMIA-IRON DEFICIENCY  . ANXIETY  . PANIC ATTACK  . DEPRESSION  . KERATOCONJUNCTIVITIS SICCA  . HYPERTENSION  . MITRAL VALVE PROLAPSE  . ABNORMAL HEART RHYTHMS  . RAYNAUD'S SYNDROME  . ESOPHAGITIS  . GERD  . GASTRITIS  . IBS  . UTI  . ROSACEA  . OSTEOARTHRITIS  . BACK PAIN  . PLANTAR FASCIITIS  . FIBROMYALGIA  . OSTEOPENIA  . SOMATIC DYSFUNCTION  . SYMPTOM, EDEMA  . PALPITATIONS  . MIGRAINES, HX OF  . PERSONAL HISTORY ALLERGY UNSPEC MEDICINAL AGENT  . Routine general medical examination at a health care facility  . Hyperkalemia  . Urine frequency  . Drug rash  . HTN (hypertension)  . Raynaud disease  .  Obesity  . Other screening mammogram   Past Medical History  Diagnosis Date  . GERD (gastroesophageal reflux disease)   . HLD (hyperlipidemia)   . HTN (hypertension)   . Hypothyroidism   . Osteoarthritis     hands  . Anemia, iron deficiency   . Diabetes mellitus type II     Diet controlled  . Edema   . Raynaud's disease   . Fibromyalgia   . Dry eyes   . Recurrent HSV (herpes simplex virus)     lesions in nose  . HNP (herniated nucleus pulposus) 2/99    T6,7,8 with DJD  . DJD (degenerative joint disease)   . Cervical dysplasia     abnormal paps  . Left ovarian cyst     x 3, rupture  . Osteopenia     mild-11/01; improved 12/05  . Diverticulosis   . Hemorrhoids    Past Surgical History  Procedure Date  . Hand surgery   . Thumb joint repair   . Colonoscopy 11/01    Diverticulosis; hemorrhoids   History  Substance Use Topics  . Smoking status: Former Games developer  . Smokeless tobacco: Never Used   Comment: quit over 40 years  . Alcohol Use: Yes     very rarely   Family History  Problem Relation Age of Onset  . Emphysema Father     + smoker  . Lung cancer Mother     + smoker  . Coronary artery disease Mother     relatively young  . Lymphoma Brother   . Lymphoma Sister   . Diabetes Brother   . Diabetes Sister   . Heart disease Brother     Allergies  Allergen Reactions  . Amitriptyline Hcl     REACTION: sedating  . Atorvastatin     REACTION: muscle  . Benicar (Olmesartan Medoxomil)     Muscle pain   . Cetirizine Hcl     REACTION: headache  . Ciprofloxacin     REACTION: ? rash vs sun rxn  . Clonidine Derivatives     Swelling   . Diltiazem Hcl     REACTION: reaction not known  . Erythromycin     REACTION: reaction not known  . Etodolac     REACTION: reaction not known  . Fluoxetine Hcl     REACTION: stomach problems  . Furosemide     REACTION: swelling  . Gabapentin     REACTION: edema  . Naproxen Sodium     REACTION: edema  . Paroxetine     REACTION: weight gain  . Penicillins     REACTION: reactions to all derivitives  . Pregabalin     REACTION: swelling  . Sulfonamide Derivatives     REACTION: reaction not known  . Tetracycline     REACTION: reaction not known  . Torsemide     REACTION: swelling  . Venlafaxine     REACTION: sweating  . Keflex Hives and Rash   Current Outpatient Prescriptions on File Prior to Visit  Medication Sig Dispense Refill  . buPROPion (WELLBUTRIN XL) 150 MG 24 hr tablet Take 1 tablet by mouth Daily.        . cyclobenzaprine (FLEXERIL) 10 MG tablet 1/2 tablet by mouth in AM and 1-2 tablets by mouth in PM      . DIGESTIVE ENZYMES PO Take by mouth 2 (two) times daily.        . diphenhydrAMINE (BENADRYL) 25 mg capsule Take 25 mg by mouth  as needed.        Marland Kitchen esomeprazole (NEXIUM) 40 MG capsule Take 40 mg by mouth 2 (two) times daily.        . Fish Oil-Cholecalciferol (OMEGA-3 FISH OIL/VITAMIN D3) 1000-1000 MG-UNIT CAPS Take by mouth daily.        Marland Kitchen HYDROcodone-acetaminophen (VICODIN) 5-500 MG per tablet Take 1 tablet by mouth every 6 (six) hours as needed for pain (watch out for sedation ).  30 tablet  0  . levothyroxine (SYNTHROID, LEVOTHROID) 125 MCG tablet Take 125 mcg by mouth daily.        . Magnesium 400 MG CAPS Take by mouth. Take 3 by mouth daily       . metoprolol  (TOPROL-XL) 50 MG 24 hr tablet Take 1 tablet (50 mg total) by mouth daily.  90 tablet  4  . Naproxen Sodium (ALEVE) 220 MG CAPS Take by mouth. Take up to 4 by mouth daily       . Probiotic Product (PROBIOTIC FORMULA PO) Take by mouth daily.        . ramipril (ALTACE) 5 MG capsule Take 1 capsule (5 mg total) by mouth daily.  30 capsule  11  . ranitidine (ZANTAC) 300 MG tablet Take 300 mg by mouth daily.        Marland Kitchen CARDIZEM LA 120 MG 24 hr tablet TAKE 1 TABLET ONCE A DAY  90 each  0  . diltiazem (CARDIZEM) 30 MG tablet Take 1 tablet (30 mg total) by mouth 3 (three) times daily as needed.  90 tablet  11  . hyoscyamine (LEVSIN SL) 0.125 MG SL tablet Take 1 tablet (0.125 mg total) by mouth every 4 (four) hours as needed for cramping.  90 tablet  1  . mometasone (NASONEX) 50 MCG/ACT nasal spray Place 1 spray into the nose daily.              Objective:   Physical Exam  Constitutional: She appears well-developed and well-nourished.       overwt and well appearing   HENT:  Head: Normocephalic and atraumatic.  Right Ear: External ear normal.  Left Ear: External ear normal.  Nose: Nose normal.  Mouth/Throat: Oropharynx is clear and moist.  Eyes: Conjunctivae and EOM are normal. Pupils are equal, round, and reactive to light. No scleral icterus.  Neck: Normal range of motion. Neck supple. No JVD present. Carotid bruit is not present. No thyromegaly present.  Cardiovascular: Normal rate, regular rhythm, normal heart sounds and intact distal pulses.  Exam reveals no gallop.   Pulmonary/Chest: Effort normal and breath sounds normal. No respiratory distress. She has no wheezes. She exhibits no tenderness.  Abdominal: Soft. Bowel sounds are normal. She exhibits no distension, no abdominal bruit and no mass. There is no tenderness.  Genitourinary: No breast swelling, tenderness, discharge or bleeding.  Musculoskeletal: Normal range of motion. She exhibits no edema and no tenderness.       Feet are  erythematous and cool- baseline for pt with raynauds  Poor rom LS  Pos fibromyalgia trigger points    Lymphadenopathy:    She has no cervical adenopathy.  Neurological: She is alert. She has normal reflexes. No cranial nerve deficit. She exhibits normal muscle tone. Coordination normal.  Skin: Skin is warm and dry. No rash noted. No erythema. No pallor.  Psychiatric: She has a normal mood and affect.       Pt seems a little frustrated (with her health) today  Assessment & Plan:

## 2011-08-18 NOTE — Patient Instructions (Addendum)
Get back on cymbalta for your chronic pain  Avoid red meat/ fried foods/ egg yolks/ fatty breakfast meats/ butter, cheese and high fat dairy/ and shellfish   If you are interested in zoster vaccine - check with your insurance and then call us for a px We will schedule your mammogram at [facility] at check out  Check about getting a bone density test at work  Lets do labs in 2 months and then follow up

## 2011-08-19 NOTE — Assessment & Plan Note (Signed)
Ongoing/ more severe with loss of rom and ability to exercise  Will try cymbalta for pain  Has been to neurosurg and ortho and pain centers for her deg disc dz

## 2011-08-19 NOTE — Assessment & Plan Note (Signed)
This is much worse  Will try cymbalta again for pain control and mood Goal is to be able to exercise again  F/u as planned

## 2011-08-19 NOTE — Assessment & Plan Note (Signed)
Fair control currently with intol of diovan hct and clonidine Will continue current meds/ work on wt loss and continue to follow

## 2011-08-19 NOTE — Assessment & Plan Note (Signed)
Lab Results  Component Value Date   TSH 1.95 03/31/2011   No clinical changes No change in supplementation

## 2011-08-19 NOTE — Assessment & Plan Note (Signed)
Reviewed ca and D req  Will try to get dexa at work and send report to [place] Also disc imp of exercise for bone health

## 2011-08-19 NOTE — Assessment & Plan Note (Signed)
Diet controlled with obesity Lab Results  Component Value Date   HGBA1C 6.6* 03/31/2011   will continue to work on it  Needs better pain control to exercise Diet is fair Re check 3 mo

## 2011-08-19 NOTE — Assessment & Plan Note (Signed)
Not ag goal and afraid to try new meds since she has such severe fibromyalgia  Disc goals for lipids and reasons to control them Rev labs with pt Rev low sat fat diet in detail  Will r echeck at f/u

## 2011-08-19 NOTE — Assessment & Plan Note (Signed)
Scheduled annual screening mammogram Nl breast exam today  Encouraged monthly self exams   

## 2011-08-19 NOTE — Assessment & Plan Note (Signed)
Reviewed health habits including diet and exercise and skin cancer prevention Also reviewed health mt list, fam hx and immunizations   Rev wellness labs  

## 2011-09-06 ENCOUNTER — Ambulatory Visit: Payer: Self-pay | Admitting: Family Medicine

## 2011-09-07 ENCOUNTER — Other Ambulatory Visit: Payer: Self-pay | Admitting: Family Medicine

## 2011-09-07 ENCOUNTER — Encounter: Payer: Self-pay | Admitting: Family Medicine

## 2011-09-08 NOTE — Telephone Encounter (Signed)
Will refill electronically  

## 2011-09-11 ENCOUNTER — Other Ambulatory Visit: Payer: Self-pay | Admitting: Family Medicine

## 2011-09-13 ENCOUNTER — Encounter: Payer: Self-pay | Admitting: *Deleted

## 2011-09-13 NOTE — Telephone Encounter (Signed)
[facility] request refill Cardizem LA 120 mg #90 x 0. Pt already scheduled for appt.

## 2011-09-15 ENCOUNTER — Telehealth: Payer: Self-pay | Admitting: *Deleted

## 2011-09-15 MED ORDER — DULOXETINE HCL 60 MG PO CPEP: 60.0000 mg | ORAL_CAPSULE | Freq: Every day | ORAL | Status: DC

## 2011-09-15 NOTE — Telephone Encounter (Signed)
Pt wants to increase Cymbalta to 60 mg, request new rx.

## 2011-09-15 NOTE — Telephone Encounter (Signed)
Patient notified as instructed by telephone.Medication phoned to [facility] as instructed.

## 2011-09-15 NOTE — Telephone Encounter (Signed)
Px written for call in   

## 2011-10-01 ENCOUNTER — Telehealth: Payer: Self-pay | Admitting: Family Medicine

## 2011-10-01 NOTE — Telephone Encounter (Signed)
Patient said that Tina Berger requires [facility] for her prescriptions and that on Dec 11 they sent [place] requests for two three month refills and [facility] said they have not heard back from our office.  [facility] asked patient to call us and check on refill.  Patient's call can be returned to [phone] until 5pm.

## 2011-10-05 ENCOUNTER — Other Ambulatory Visit: Payer: Self-pay | Admitting: Family Medicine

## 2011-10-05 NOTE — Telephone Encounter (Signed)
Left v/m on home and cell for pt to call back. I do not see refill request from [facility].

## 2011-10-06 NOTE — Telephone Encounter (Signed)
Spoke with pt and she will call [facility] and ask them to refax the request. I gave pt my desk fax # also if that would help.

## 2011-10-07 NOTE — Telephone Encounter (Signed)
OK to refill? Unable to locate [facility] on med list.

## 2011-10-07 NOTE — Telephone Encounter (Signed)
Please verify with pt she is taking both of these and send back to me for refil thanks

## 2011-10-08 ENCOUNTER — Other Ambulatory Visit (INDEPENDENT_AMBULATORY_CARE_PROVIDER_SITE_OTHER): Payer: Managed Care, Other (non HMO)

## 2011-10-08 DIAGNOSIS — E119 Type 2 diabetes mellitus without complications: Secondary | ICD-10-CM

## 2011-10-08 DIAGNOSIS — E785 Hyperlipidemia, unspecified: Secondary | ICD-10-CM

## 2011-10-08 LAB — HEMOGLOBIN A1C: Hgb A1c MFr Bld: 6.3 % (ref 4.6–6.5)

## 2011-10-08 LAB — LIPID PANEL
Cholesterol: 226 mg/dL — ABNORMAL HIGH (ref 0–200)
HDL: 53.5 mg/dL (ref >39.00)
VLDL: 36 mg/dL (ref 0.0–40.0)

## 2011-10-08 LAB — AST: AST: 24 U/L (ref 0–37)

## 2011-10-08 LAB — LDL CHOLESTEROL, DIRECT: Direct LDL: 135.6 mg/dL

## 2011-10-08 NOTE — Telephone Encounter (Signed)
Left v/m on home # for pt to call back on Mon morning. Voice mail full on cell phone.

## 2011-10-13 NOTE — Telephone Encounter (Signed)
Patient verified she is taking both medications 

## 2011-10-13 NOTE — Telephone Encounter (Signed)
Will refill electronically to [facility]

## 2011-10-15 ENCOUNTER — Encounter: Payer: Self-pay | Admitting: Family Medicine

## 2011-10-15 ENCOUNTER — Ambulatory Visit (INDEPENDENT_AMBULATORY_CARE_PROVIDER_SITE_OTHER): Payer: Managed Care, Other (non HMO) | Admitting: Family Medicine

## 2011-10-15 VITALS — BP 128/84 | HR 88 | Temp 99.1°F | Ht 62.75 in | Wt 213.2 lb

## 2011-10-15 DIAGNOSIS — I10 Essential (primary) hypertension: Secondary | ICD-10-CM

## 2011-10-15 DIAGNOSIS — E785 Hyperlipidemia, unspecified: Secondary | ICD-10-CM

## 2011-10-15 DIAGNOSIS — E119 Type 2 diabetes mellitus without complications: Secondary | ICD-10-CM

## 2011-10-15 DIAGNOSIS — IMO0001 Reserved for inherently not codable concepts without codable children: Secondary | ICD-10-CM

## 2011-10-15 MED ORDER — DULOXETINE HCL 60 MG PO CPEP: 60.0000 mg | ORAL_CAPSULE | Freq: Every day | ORAL | Status: DC

## 2011-10-15 MED ORDER — RAMIPRIL 5 MG PO CAPS: 5.0000 mg | ORAL_CAPSULE | Freq: Every day | ORAL | Status: DC

## 2011-10-15 NOTE — Assessment & Plan Note (Signed)
Off ca channel blocker due to the edema and doing fine bp in fair control at this time  No changes needed  Disc lifstyle change with low sodium diet and exercise   F/u 3 mo

## 2011-10-15 NOTE — Assessment & Plan Note (Signed)
Some improvement now back on cymbalta 60- will continue this  Will continue to inc activity as tolerated  F/u 3 mo

## 2011-10-15 NOTE — Patient Instructions (Addendum)
Sugar and cholesterol are improved Continue better diet and exercise  Sent cymbalta to [facility]  Schedule follow up in 3 months with labs prior (fasting )

## 2011-10-15 NOTE — Assessment & Plan Note (Signed)
a1c is down to 6.3 this time- taking her into hyperglycemic range  Better diet and sugars at home are ok utd opthy  On ace  Lab and flu 3 mo emph imp of exercise/ wt loss

## 2011-10-15 NOTE — Progress Notes (Signed)
Subjective:    Patient ID: Tina Berger, female    DOB: 1951/06/11, 60 y.o.   MRN: [id number]  HPI Here for f/u of fibromyalgia and DM and hyperlipidemia  Is getting over a cold   Fibro -was flaring at last visit Re started cymbalta- not in as much pain overall -pleased with that  Does have a wheezy feeling in her throat -- ? If rel to that  Could be because she was sick  Some apthous ulcers in mouth   If it does not improve after cold is better - may consider going down on the dose   DM - a1c is improved at 6.3 from 6.6 Diet controlled  opthy up to date  On altace  bp is  126/82   Today No cp or palpitations or headaches or edema  No side effects to medicines   She stopped her calcium channel blocker due to edema - doing ok without it   Lipids are improved with diet Lab Results  Component Value Date   CHOL 226* 10/08/2011   CHOL 238* 03/31/2011   CHOL 224* 11/24/2009   Lab Results  Component Value Date   HDL 53.50 10/08/2011   HDL 54.90 03/31/2011   HDL 78.29 11/24/2009   Lab Results  Component Value Date   LDLCALC 122* 03/25/2008   Lab Results  Component Value Date   TRIG 180.0* 10/08/2011   TRIG 113.0 03/31/2011   TRIG 77.0 11/24/2009   Lab Results  Component Value Date   CHOLHDL 4 10/08/2011   CHOLHDL 4 03/31/2011   CHOLHDL 4 11/24/2009   Lab Results  Component Value Date   LDLDIRECT 135.6 10/08/2011   LDLDIRECT 158.9 03/31/2011   LDLDIRECT 146.1 11/24/2009    Up until she got sick - was able to do more - more energy from the cymbalta  Unfortunately - did gain weight  Has not exercised since she got sick  Patient Active Problem List  Diagnoses  . HYPOTHYROIDISM  . DIABETES MELLITUS, TYPE II  . UNSPECIFIED VITAMIN D DEFICIENCY  . HYPERLIPIDEMIA  . ANEMIA-IRON DEFICIENCY  . ANXIETY  . PANIC ATTACK  . DEPRESSION  . KERATOCONJUNCTIVITIS SICCA  . MITRAL VALVE PROLAPSE  . ABNORMAL HEART RHYTHMS  . RAYNAUD'S SYNDROME  . ESOPHAGITIS  . GERD  . GASTRITIS    . IBS  . ROSACEA  . OSTEOARTHRITIS  . BACK PAIN  . PLANTAR FASCIITIS  . FIBROMYALGIA  . OSTEOPENIA  . SOMATIC DYSFUNCTION  . SYMPTOM, EDEMA  . PALPITATIONS  . MIGRAINES, HX OF  . PERSONAL HISTORY ALLERGY UNSPEC MEDICINAL AGENT  . Routine general medical examination at a health care facility  . Hyperkalemia  . Urine frequency  . Drug rash  . HTN (hypertension)  . Raynaud disease  . Obesity  . Other screening mammogram   Past Medical History  Diagnosis Date  . GERD (gastroesophageal reflux disease)   . HLD (hyperlipidemia)   . HTN (hypertension)   . Hypothyroidism   . Osteoarthritis     hands  . Anemia, iron deficiency   . Diabetes mellitus type II     Diet controlled  . Edema   . Raynaud's disease   . Fibromyalgia   . Dry eyes   . Recurrent HSV (herpes simplex virus)     lesions in nose  . HNP (herniated nucleus pulposus) 2/99    T6,7,8 with DJD  . DJD (degenerative joint disease)   . Cervical dysplasia     abnormal  paps  . Left ovarian cyst     x 3, rupture  . Osteopenia     mild-11/01; improved 12/05  . Diverticulosis   . Hemorrhoids    Past Surgical History  Procedure Date  . Hand surgery   . Thumb joint repair   . Colonoscopy 11/01    Diverticulosis; hemorrhoids   History  Substance Use Topics  . Smoking status: Former Games developer  . Smokeless tobacco: Never Used   Comment: quit over 40 years  . Alcohol Use: Yes     very rarely   Family History  Problem Relation Age of Onset  . Emphysema Father     + smoker  . Lung cancer Mother     + smoker  . Coronary artery disease Mother     relatively young  . Lymphoma Brother   . Lymphoma Sister   . Diabetes Brother   . Diabetes Sister   . Heart disease Brother    Allergies  Allergen Reactions  . Amitriptyline Hcl     REACTION: sedating  . Atorvastatin     REACTION: muscle  . Benicar (Olmesartan Medoxomil)     Muscle pain   . Cetirizine Hcl     REACTION: headache  . Ciprofloxacin      REACTION: ? rash vs sun rxn  . Clonidine Derivatives     Swelling   . Diltiazem Hcl     REACTION: reaction not known  . Erythromycin     REACTION: reaction not known  . Etodolac     REACTION: reaction not known  . Fluoxetine Hcl     REACTION: stomach problems  . Furosemide     REACTION: swelling  . Gabapentin     REACTION: edema  . Naproxen Sodium     REACTION: edema  . Paroxetine     REACTION: weight gain  . Penicillins     REACTION: reactions to all derivitives  . Pregabalin     REACTION: swelling  . Sulfonamide Derivatives     REACTION: reaction not known  . Tetracycline     REACTION: reaction not known  . Torsemide     REACTION: swelling  . Venlafaxine     REACTION: sweating  . Keflex Hives and Rash   Current Outpatient Prescriptions on File Prior to Visit  Medication Sig Dispense Refill  . buPROPion (WELLBUTRIN XL) 150 MG 24 hr tablet TAKE 1 TABLET DAILY  90 tablet  2  . cyclobenzaprine (FLEXERIL) 10 MG tablet 1/2 tablet by mouth in AM and 1-2 tablets by mouth in PM      . DIGESTIVE ENZYMES PO Take by mouth 2 (two) times daily.        Marland Kitchen diltiazem (CARDIZEM LA) 120 MG 24 hr tablet Take 1 tablet (120 mg total) by mouth daily.  90 tablet  0  . Fish Oil-Cholecalciferol (OMEGA-3 FISH OIL/VITAMIN D3) 1000-1000 MG-UNIT CAPS Take by mouth daily.        . hyoscyamine (LEVSIN SL) 0.125 MG SL tablet Take 1 tablet (0.125 mg total) by mouth every 4 (four) hours as needed for cramping.  90 tablet  1  . levothyroxine (SYNTHROID, LEVOTHROID) 125 MCG tablet Take 125 mcg by mouth daily.        . Magnesium 400 MG CAPS Take by mouth. Take 3 by mouth daily       . metoprolol (TOPROL-XL) 50 MG 24 hr tablet Take 1 tablet (50 mg total) by mouth daily.  90 tablet  4  .  mometasone (NASONEX) 50 MCG/ACT nasal spray Place 1 spray into the nose daily.        . Naproxen Sodium (ALEVE) 220 MG CAPS Take by mouth. Take up to 4 by mouth daily       . NEXIUM 40 MG capsule TAKE 1 CAPSULE TWICE A DAY   180 capsule  3  . nitrofurantoin, macrocrystal-monohydrate, (MACROBID) 100 MG capsule Take 1 tablet by mouth Nightly.      . Probiotic Product (PROBIOTIC FORMULA PO) Take by mouth daily.        . ranitidine (ZANTAC) 300 MG tablet Take 300 mg by mouth daily.        Marland Kitchen diltiazem (CARDIZEM) 30 MG tablet Take 1 tablet (30 mg total) by mouth 3 (three) times daily as needed.  90 tablet  11  . diphenhydrAMINE (BENADRYL) 25 mg capsule Take 25 mg by mouth as needed.        Marland Kitchen HYDROcodone-acetaminophen (VICODIN) 5-500 MG per tablet Take 1 tablet by mouth every 6 (six) hours as needed for pain (watch out for sedation ).  30 tablet  0  . MAXALT-MLT 10 MG disintegrating tablet TAKE 1 TABLET AS NEEDED AS DIRECTED FOR MIGRAINE  27 tablet  3      Review of Systems Review of Systems  Constitutional: Negative for fever, appetite change,  and unexpected weight change. Pos for fatigue Eyes: Negative for pain and visual disturbance.  Respiratory: Negative for cough and shortness of breath.   Cardiovascular: Negative for cp or palpitations    Gastrointestinal: Negative for nausea, diarrhea and constipation.  Genitourinary: Negative for urgency and frequency.  Skin: Negative for pallor or rash   MSK pos for muscle and joint pain , pos for cold hands and feet  Neurological: Negative for weakness, light-headedness, numbness and headaches.  Hematological: Negative for adenopathy. Does not bruise/bleed easily.  Psychiatric/Behavioral: Negative for dysphoric mood. The patient is not nervous/anxious.          Objective:   Physical Exam  Constitutional: She appears well-developed and well-nourished. No distress.       overwt and well appearing   HENT:  Head: Normocephalic and atraumatic.  Mouth/Throat: Oropharynx is clear and moist.       Boggy nares- slt congested   Eyes: Conjunctivae and EOM are normal. Pupils are equal, round, and reactive to light. No scleral icterus.  Neck: Normal range of motion. Neck  supple. No JVD present. Carotid bruit is not present. No thyromegaly present.  Cardiovascular: Normal rate, regular rhythm, normal heart sounds and intact distal pulses.  Exam reveals no gallop.   Pulmonary/Chest: Effort normal and breath sounds normal. No respiratory distress. She has no wheezes.  Abdominal: Bowel sounds are normal. There is no tenderness.  Musculoskeletal: Normal range of motion. She exhibits tenderness. She exhibits no edema.       Tender myofascial trigger points baseline No acute joint swelling  Signs of raynaud in feet/ hands    Lymphadenopathy:    She has no cervical adenopathy.  Neurological: She is alert. She has normal reflexes. She exhibits normal muscle tone. Coordination normal.       No tremor   Skin: Skin is warm and dry. No rash noted. No erythema. No pallor.  Psychiatric: She has a normal mood and affect.          Assessment & Plan:

## 2011-10-15 NOTE — Assessment & Plan Note (Signed)
Improved with diet - LDL in 130s Will not take statins  Disc goals for lipids and reasons to control them Rev labs with pt Rev low sat fat diet in detail

## 2011-10-28 ENCOUNTER — Other Ambulatory Visit: Payer: Self-pay | Admitting: Family Medicine

## 2011-10-28 NOTE — Telephone Encounter (Signed)
Will refill electronically  

## 2011-10-28 NOTE — Telephone Encounter (Signed)
[facility] request refill cyclobenzaprine 10 mg. Pt last seen 10/15/11.Please advise.

## 2011-11-14 ENCOUNTER — Emergency Department: Payer: Self-pay | Admitting: Internal Medicine

## 2011-11-17 ENCOUNTER — Telehealth: Payer: Self-pay | Admitting: Family Medicine

## 2011-11-17 DIAGNOSIS — M5116 Intervertebral disc disorders with radiculopathy, lumbar region: Secondary | ICD-10-CM

## 2011-11-17 NOTE — Telephone Encounter (Signed)
Lumbar is correct. Pt will call back tomorrow to schedule appt on Fri and I faxed record request from [facility] for pts visit 11/14/11.

## 2011-11-17 NOTE — Telephone Encounter (Signed)
Left vm for pt to callback 

## 2011-11-17 NOTE — Telephone Encounter (Signed)
Patient went to [facility] ER on 11/15/11 for back pain.  Patient was given a shot for pain and given Prednisone and told her she would have to see a Neurosurgeon.  Patient wants to go to [facility],but they need for her to have an MRI before they'll schedule an appointment.  Patient asked if you would schedule an MRI. Patient would like to go to [facility] for MRI.  Patient can be reached at home today or at work after 10:00 am.

## 2011-11-17 NOTE — Telephone Encounter (Signed)
Patient notified as instructed by telephone. Pt said she had cervical laminectomy in 1992. Pt states she has no metal in her body. Pt Will wait to hear from pt care coordinator about appt. At [facility]. Pt can be reached at work [phone] and home [phone]. Pt has 6 Percocet left for pain and she wants to know if Dr Milinda Antis would need to see her or can she order a new med called Nycynta for pain. Pt said she cannot work taking Percocet and the OTC pain meds do not help at all.  Pt uses [facility] [address] if med can be sent in.

## 2011-11-17 NOTE — Telephone Encounter (Signed)
Please ask her if she has had any back or spinal surgery before or if she has any metal in her body -thanks  Then I will order MRI

## 2011-11-17 NOTE — Telephone Encounter (Signed)
I forgot to ask which area of back -- ? Lumbar-- I will order MRI I cannot fill pain med without visit - put her on schedule please  Send for records from [facility] too please

## 2011-11-18 DIAGNOSIS — M5116 Intervertebral disc disorders with radiculopathy, lumbar region: Secondary | ICD-10-CM | POA: Insufficient documentation

## 2011-11-19 ENCOUNTER — Ambulatory Visit (INDEPENDENT_AMBULATORY_CARE_PROVIDER_SITE_OTHER): Payer: Managed Care, Other (non HMO) | Admitting: Family Medicine

## 2011-11-19 ENCOUNTER — Encounter: Payer: Self-pay | Admitting: Family Medicine

## 2011-11-19 VITALS — BP 140/94 | HR 86 | Temp 98.6°F | Wt 211.0 lb

## 2011-11-19 DIAGNOSIS — IMO0002 Reserved for concepts with insufficient information to code with codable children: Secondary | ICD-10-CM

## 2011-11-19 DIAGNOSIS — M549 Dorsalgia, unspecified: Secondary | ICD-10-CM

## 2011-11-19 HISTORY — DX: Reserved for concepts with insufficient information to code with codable children: IMO0002

## 2011-11-19 MED ORDER — LEVOTHYROXINE SODIUM 125 MCG PO TABS: 125.0000 ug | ORAL_TABLET | Freq: Every day | ORAL | Status: DC

## 2011-11-19 MED ORDER — OXYCODONE-ACETAMINOPHEN 5-325 MG PO TABS: 1.0000 | ORAL_TABLET | ORAL | Status: DC | PRN

## 2011-11-19 NOTE — Assessment & Plan Note (Signed)
Ongoing - much worse this week  Rev records from [facility] Refilled percocet # 60 -- pt takes on avg 2 per day with caution Will send for records from [facility] so we can ref for MRI and then ref to [facility]

## 2011-11-19 NOTE — Assessment & Plan Note (Signed)
Pt has it in all levels but current acute problem is lumbar Will get records from [facility] orth[facility] and then sched MRI and then ref to [facility]

## 2011-11-19 NOTE — Patient Instructions (Signed)
Please send for all records from [facility] orthopedics and MRIs and nerve conduction tests  When these arrive we should be able to then schedule your MRI  Use percocet sparingly

## 2011-11-19 NOTE — Progress Notes (Signed)
Subjective:    Patient ID: Tina Berger, female    DOB: 1951-06-15, 61 y.o.   MRN: [id number]  HPI Here for back pain   Has pain cervical/ TS and LS  Worse in lumbar area right now  Worst pain ever Sunday am -- in LS with L leg numbness (worse on the L )  Really sharp pains in leg  She did sit a long time the day before -- that aggrivated it   Walks and uses pilates -(gentle )-- up until now   Goes to [facility] orthopedics - has seen them for 10 years and went through pain management tx with Dr Ethelene Hal Had all nerve root inj she could have Then saw Dr Shon Baton -- to consider surgery  Last visit in aug  3 MRIs within the past 15 months   Thinks she ruptured a disc this summer  Some in thoracic and lumbar and cervical overall  R leg stays numb -- has had nerve conduction tests   Wants to see neurosurgeon   In past Dr Jeral Fruit did her cervical laminectomy  She did not like him (no communication)   Now she wants to go to [facility]  Spinal care- team approach -- ? surg vs alt treatments   Last MRI 1 year ago in jan   Patient Active Problem List  Diagnoses  . HYPOTHYROIDISM  . DIABETES MELLITUS, TYPE II  . UNSPECIFIED VITAMIN D DEFICIENCY  . HYPERLIPIDEMIA  . ANEMIA-IRON DEFICIENCY  . ANXIETY  . PANIC ATTACK  . DEPRESSION  . KERATOCONJUNCTIVITIS SICCA  . MITRAL VALVE PROLAPSE  . ABNORMAL HEART RHYTHMS  . RAYNAUD'S SYNDROME  . ESOPHAGITIS  . GERD  . GASTRITIS  . IBS  . ROSACEA  . OSTEOARTHRITIS  . BACK PAIN  . PLANTAR FASCIITIS  . FIBROMYALGIA  . OSTEOPENIA  . SOMATIC DYSFUNCTION  . PALPITATIONS  . MIGRAINES, HX OF  . PERSONAL HISTORY ALLERGY UNSPEC MEDICINAL AGENT  . Routine general medical examination at a health care facility  . Hyperkalemia  . Urine frequency  . Drug rash  . HTN (hypertension)  . Raynaud disease  . Obesity  . Other screening mammogram  . Lumbar disc disease with radiculopathy  . Degenerative disk disease   Past Medical History  Diagnosis Date    . GERD (gastroesophageal reflux disease)   . HLD (hyperlipidemia)   . HTN (hypertension)   . Hypothyroidism   . Osteoarthritis     hands  . Anemia, iron deficiency   . Diabetes mellitus type II     Diet controlled  . Edema   . Raynaud's disease   . Fibromyalgia   . Dry eyes   . Recurrent HSV (herpes simplex virus)     lesions in nose  . HNP (herniated nucleus pulposus) 2/99    T6,7,8 with DJD  . DJD (degenerative joint disease)   . Cervical dysplasia     abnormal paps  . Left ovarian cyst     x 3, rupture  . Osteopenia     mild-11/01; improved 12/05  . Diverticulosis   . Hemorrhoids    Past Surgical History  Procedure Date  . Hand surgery   . Thumb joint repair   . Colonoscopy 11/01    Diverticulosis; hemorrhoids   History  Substance Use Topics  . Smoking status: Former Games developer  . Smokeless tobacco: Never Used   Comment: quit over 40 years  . Alcohol Use: Yes     very rarely  Family History  Problem Relation Age of Onset  . Emphysema Father     + smoker  . Lung cancer Mother     + smoker  . Coronary artery disease Mother     relatively young  . Lymphoma Brother   . Lymphoma Sister   . Diabetes Brother   . Diabetes Sister   . Heart disease Brother    Allergies  Allergen Reactions  . Amitriptyline Hcl     REACTION: sedating  . Atorvastatin     REACTION: muscle  . Benicar (Olmesartan Medoxomil)     Muscle pain   . Cetirizine Hcl     REACTION: headache  . Ciprofloxacin     REACTION: ? rash vs sun rxn  . Clonidine Derivatives     Swelling   . Diltiazem Hcl     REACTION: reaction not known  . Erythromycin     REACTION: reaction not known  . Etodolac     REACTION: reaction not known  . Fluoxetine Hcl     REACTION: stomach problems  . Furosemide     REACTION: swelling  . Gabapentin     REACTION: edema  . Naproxen Sodium     REACTION: edema  . Paroxetine     REACTION: weight gain  . Penicillins     REACTION: reactions to all derivitives   . Pregabalin     REACTION: swelling  . Sulfonamide Derivatives     REACTION: reaction not known  . Tetracycline     REACTION: reaction not known  . Torsemide     REACTION: swelling  . Venlafaxine     REACTION: sweating  . Keflex Hives and Rash   Current Outpatient Prescriptions on File Prior to Visit  Medication Sig Dispense Refill  . buPROPion (WELLBUTRIN XL) 150 MG 24 hr tablet TAKE 1 TABLET DAILY  90 tablet  2  . Chlorphen-Phenyleph-ASA (ALKA-SELTZER PLUS COLD PO) Take by mouth as needed.        . cyclobenzaprine (FLEXERIL) 10 MG tablet 1/2 tablet by mouth in AM and 1-2 tablets by mouth in PM      . DIGESTIVE ENZYMES PO Take by mouth 2 (two) times daily.        . diphenhydrAMINE (BENADRYL) 25 mg capsule Take 25 mg by mouth as needed.        . DULoxetine (CYMBALTA) 60 MG capsule Take 1 capsule (60 mg total) by mouth daily.  90 capsule  3  . Fish Oil-Cholecalciferol (OMEGA-3 FISH OIL/VITAMIN D3) 1000-1000 MG-UNIT CAPS Take by mouth daily.        . GUAIFENESIN CR PO Take by mouth as needed.        . hyoscyamine (LEVSIN SL) 0.125 MG SL tablet Take 1 tablet (0.125 mg total) by mouth every 4 (four) hours as needed for cramping.  90 tablet  1  . Magnesium 400 MG CAPS Take by mouth. Take 3 by mouth daily       . MAXALT-MLT 10 MG disintegrating tablet TAKE 1 TABLET AS NEEDED AS DIRECTED FOR MIGRAINE  27 tablet  3  . metoprolol (TOPROL-XL) 50 MG 24 hr tablet Take 1 tablet (50 mg total) by mouth daily.  90 tablet  4  . Naproxen Sodium (ALEVE) 220 MG CAPS Take by mouth. Take up to 4 by mouth daily       . NEXIUM 40 MG capsule TAKE 1 CAPSULE TWICE A DAY  180 capsule  3  . nitrofurantoin, macrocrystal-monohydrate, (MACROBID) 100  MG capsule Take 1 tablet by mouth Nightly.      . Probiotic Product (PROBIOTIC FORMULA PO) Take by mouth daily.        . ramipril (ALTACE) 5 MG capsule Take 1 capsule (5 mg total) by mouth daily.  90 capsule  3        Review of Systems Review of Systems   Constitutional: Negative for fever, appetite change,  and unexpected weight change. pos for fatigue Eyes: Negative for pain and visual disturbance.  Respiratory: Negative for cough and shortness of breath.   Cardiovascular: Negative for cp or palpitations    Gastrointestinal: Negative for nausea, diarrhea and constipation.  Genitourinary: Negative for urgency and frequency.  Skin: Negative for pallor or rash   MSK pos for back pain with radiation to leg, neg for swollen joints  Neurological: Negative for weakness, light-headedness, numbness and headaches.  Hematological: Negative for adenopathy. Does not bruise/bleed easily.  Psychiatric/Behavioral: Negative for dysphoric mood. The patient is not nervous/anxious.          Objective:   Physical Exam  Constitutional: She appears well-developed and well-nourished. No distress.       overwt and well appearing   HENT:  Head: Normocephalic and atraumatic.  Mouth/Throat: Oropharynx is clear and moist.  Eyes: Conjunctivae and EOM are normal. Pupils are equal, round, and reactive to light. No scleral icterus.  Neck: Normal range of motion. Neck supple. No JVD present. Carotid bruit is not present. Erythema present. No thyromegaly present.  Cardiovascular: Normal rate, regular rhythm, normal heart sounds and intact distal pulses.   Pulmonary/Chest: Effort normal and breath sounds normal.  Abdominal:       No suprapubic tenderness    Musculoskeletal: She exhibits tenderness. She exhibits no edema.       Lumbar back: She exhibits decreased range of motion, tenderness, bony tenderness and spasm. She exhibits no swelling, no edema and no deformity.       LS - tender over L3- S1 , over bones and over L buttock Pos SLR for thigh pain    Lymphadenopathy:    She has no cervical adenopathy.  Neurological: She is alert. She has normal strength and normal reflexes. She displays no atrophy. No sensory deficit. She exhibits normal muscle tone.  Coordination normal.  Skin: Skin is warm and dry.  Psychiatric: She has a normal mood and affect.          Assessment & Plan:

## 2011-12-03 ENCOUNTER — Telehealth: Payer: Self-pay | Admitting: Family Medicine

## 2011-12-10 ENCOUNTER — Ambulatory Visit
Admission: RE | Admit: 2011-12-10 | Discharge: 2011-12-10 | Disposition: A | Discharge status: Home / Self Care | Payer: Managed Care, Other (non HMO) | Source: Ambulatory Visit | Attending: Family Medicine | Admitting: Family Medicine

## 2011-12-10 DIAGNOSIS — M5116 Intervertebral disc disorders with radiculopathy, lumbar region: Secondary | ICD-10-CM

## 2011-12-21 ENCOUNTER — Telehealth: Payer: Self-pay | Admitting: Family Medicine

## 2011-12-21 NOTE — Telephone Encounter (Signed)
Will see Dr Dayton Martes

## 2011-12-21 NOTE — Telephone Encounter (Signed)
Triage Record Num: [id number] Operator: [name] Patient Name: Tina Berger Call Date & Time: 12/21/2011 11:18:29AM Patient Phone: [phone] PCP: [name]. Tower Patient Gender: Female PCP Fax : Patient DOB: August 11, 1951 Practice Name: [facility] Reason for Call: Caller: Tina Berger/Mother; PCP: Tina Berger A.; CB#: [phone]; ; ; Calling today 12/21/11 regarding Back Pain; 3-4 weeks ago she had major flare up of Degenertive disc disease, and had to go to ED . That week she also came in to see Dr. Milinda Berger and was given a prescription for pain meds and was referred to [facility] and in order to do this you have to have an MRI done in last 3 months, Had MRI done 12/10/11, has appt scheduled for [facility] on 01/19/12. Has been doing better but not great. Had her hair done this weekend and had to sit for a long time. When she got of car, she twisted and her back went out again. Can tell she has alot of inflammation. Was given Prednisone at ED and does not want to take this again if possible. She normally takes Alieve and muscle relaxer, wants to know if anything else she can take for inflamation. Was told when she saw MD that if she needed anything else for pain then she would need to come in, pt wants to know if that means she can come by and pick up prescription or if she would need to be seen. Advised pt that means she would need to actually be seen. Emergent symptoms r/o by Back Symptoms guidelines with exception of back pain radiates into thigh or below knee. Care advice given. No appts today for Dr. Milinda Berger, offered appt with Dr. Para Berger at 4 PM, however pt refused, checked tomorrow and no appts scheduled for Dr. Milinda Berger. Pt requesting appt be put off until Thursday. Contacted appt desk and was advised that Dr. Milinda Berger will be out all week. Pt agreed to an appt tomorrow 3/5/6 at 9 AM with Dr. Dayton Berger. Protocol(s) Used: Back Symptoms Recommended Outcome per Protocol:  See Provider within 24 hours Reason for Outcome: Back pain radiates into thigh or below knee Care Advice: ~ Avoid heavy lifting, bending and twisting of the back, and prolonged sitting until evaluated by provider. Apply a cloth-covered cold or ice pack to the area for 20 minutes 4 to 8 times a day for relief of pain for the first 24-48 hours. After 24 to 48 hours of cold application, use a cloth-covered heat pack to the area for 20 minutes 3 to 4 times a day. ~ Sleep on a moderately firm mattress. Try sleeping on back with a pillow placed under knees or sleep on side with knees bent and a pillow between knees. When lying on stomach, place pillow under the abdomen and pelvis; do not use a pillow for your head. ~ ~ Avoid activity that causes or worsens symptoms. ~ SYMPTOM / CONDITION MANAGEMENT 12/21/2011 11:44:55AM Page 1 of 1 [id number]

## 2011-12-22 ENCOUNTER — Ambulatory Visit (INDEPENDENT_AMBULATORY_CARE_PROVIDER_SITE_OTHER): Payer: Managed Care, Other (non HMO) | Admitting: Family Medicine

## 2011-12-22 ENCOUNTER — Encounter: Payer: Self-pay | Admitting: Family Medicine

## 2011-12-22 VITALS — BP 110/82 | HR 76 | Temp 99.2°F | Wt 210.0 lb

## 2011-12-22 DIAGNOSIS — M5116 Intervertebral disc disorders with radiculopathy, lumbar region: Secondary | ICD-10-CM

## 2011-12-22 DIAGNOSIS — M5126 Other intervertebral disc displacement, lumbar region: Secondary | ICD-10-CM

## 2011-12-22 MED ORDER — DEXAMETHASONE SOD PHOSPHATE PF 10 MG/ML IJ SOLN
10.0000 mg | Freq: Once | INTRAMUSCULAR | Status: AC
  Administered 2011-12-22: 10 mg via INTRAMUSCULAR

## 2011-12-22 MED ORDER — HYDROCODONE-ACETAMINOPHEN 5-500 MG PO TABS: 1.0000 | ORAL_TABLET | Freq: Four times a day (QID) | ORAL | Status: DC | PRN

## 2011-12-22 NOTE — Patient Instructions (Signed)
Good to see you. Please do not take Ibuprofen/Alleve for the next 72 hours. Please call on Friday if symptoms have not improved.

## 2011-12-22 NOTE — Progress Notes (Signed)
Subjective:    Patient ID: Tina Berger, female    DOB: 03/31/1951, 61 y.o.   MRN: [id number]  HPI 61 yo pt of Dr. Royden Purl with h/o degenerative disc disease of spine here for ?pain medications.  Extensive history and notes reviewed.  3-4 weeks ago she had major flare up of Degenertive disc disease, and had to go to ED [place].   Saw Dr. Milinda Antis last month (around same time) and given rx for Percocet and was referred to [facility]  MRI done 12/10/11 which showed:  IMPRESSION:  Slightly prominent epidural fat in the lateral position contributes  to very mild narrowing of the thecal sac in a right to left  direction throughout the lumbar spine.  L1-2 mild bulge.  L2-3 bulge/shallow broad-based protrusion greater to the left  contributing to very mild left lateral recess stenosis.  L3-4 bulge with lateral extension slightly greater on the left  touching but not compressing the exiting left L3 nerve root.  L4-5 bulge/shallow broad-based protrusion slightly greater to the  right.  L5-S small broad-based disc osteophyte slightly greater left  lateral position approaching but not compressing the exiting left  L5 nerve root.  Has appt scheduled for [facility] on 01/19/12.    Has been doing better but not great. Had her hair done this weekend and had to sit for a long time. When she got of car, she twisted and felt worsening of back pain again. Can tell she has alot of inflammation. Was given Prednisone at ED and does not want to take this again if possible. She normally takes Alieve and muscle  relaxer, wants to know if anything else she can take for inflamation. Was told when she saw  MD that if she needed anything else for pain then she would need to come in.  Walks and uses pilates -(gentle )-- up until now    Patient Active Problem List  Diagnoses  . HYPOTHYROIDISM  . DIABETES MELLITUS, TYPE II  . UNSPECIFIED VITAMIN D DEFICIENCY  . HYPERLIPIDEMIA  .  ANEMIA-IRON DEFICIENCY  . ANXIETY  . PANIC ATTACK  . DEPRESSION  . KERATOCONJUNCTIVITIS SICCA  . MITRAL VALVE PROLAPSE  . ABNORMAL HEART RHYTHMS  . RAYNAUD'S SYNDROME  . ESOPHAGITIS  . GERD  . GASTRITIS  . IBS  . ROSACEA  . OSTEOARTHRITIS  . BACK PAIN  . PLANTAR FASCIITIS  . FIBROMYALGIA  . OSTEOPENIA  . SOMATIC DYSFUNCTION  . PALPITATIONS  . MIGRAINES, HX OF  . PERSONAL HISTORY ALLERGY UNSPEC MEDICINAL AGENT  . Routine general medical examination at a health care facility  . Hyperkalemia  . Urine frequency  . Drug rash  . HTN (hypertension)  . Raynaud disease  . Obesity  . Other screening mammogram  . Lumbar disc disease with radiculopathy  . Degenerative disk disease   Past Medical History  Diagnosis Date  . GERD (gastroesophageal reflux disease)   . HLD (hyperlipidemia)   . HTN (hypertension)   . Hypothyroidism   . Osteoarthritis     hands  . Anemia, iron deficiency   . Diabetes mellitus type II     Diet controlled  . Edema   . Raynaud's disease   . Fibromyalgia   . Dry eyes   . Recurrent HSV (herpes simplex virus)     lesions in nose  . HNP (herniated nucleus pulposus) 2/99    T6,7,8 with DJD  . DJD (degenerative joint disease)   . Cervical dysplasia  abnormal paps  . Left ovarian cyst     x 3, rupture  . Osteopenia     mild-11/01; improved 12/05  . Diverticulosis   . Hemorrhoids    Past Surgical History  Procedure Date  . Hand surgery   . Thumb joint repair   . Colonoscopy 11/01    Diverticulosis; hemorrhoids   History  Substance Use Topics  . Smoking status: Former Games developer  . Smokeless tobacco: Never Used   Comment: quit over 40 years  . Alcohol Use: Yes     very rarely   Family History  Problem Relation Age of Onset  . Emphysema Father     + smoker  . Lung cancer Mother     + smoker  . Coronary artery disease Mother     relatively young  . Lymphoma Brother   . Lymphoma Sister   . Diabetes Brother   . Diabetes Sister   .  Heart disease Brother    Allergies  Allergen Reactions  . Amitriptyline Hcl     REACTION: sedating  . Atorvastatin     REACTION: muscle  . Benicar (Olmesartan Medoxomil)     Muscle pain   . Cetirizine Hcl     REACTION: headache  . Ciprofloxacin     REACTION: ? rash vs sun rxn  . Clonidine Derivatives     Swelling   . Diltiazem Hcl     REACTION: reaction not known  . Erythromycin     REACTION: reaction not known  . Etodolac     REACTION: reaction not known  . Fluoxetine Hcl     REACTION: stomach problems  . Furosemide     REACTION: swelling  . Gabapentin     REACTION: edema  . Naproxen Sodium     REACTION: edema  . Paroxetine     REACTION: weight gain  . Penicillins     REACTION: reactions to all derivitives  . Pregabalin     REACTION: swelling  . Sulfonamide Derivatives     REACTION: reaction not known  . Tetracycline     REACTION: reaction not known  . Torsemide     REACTION: swelling  . Venlafaxine     REACTION: sweating  . Keflex Hives and Rash   Current Outpatient Prescriptions on File Prior to Visit  Medication Sig Dispense Refill  . buPROPion (WELLBUTRIN XL) 150 MG 24 hr tablet TAKE 1 TABLET DAILY  90 tablet  2  . Chlorphen-Phenyleph-ASA (ALKA-SELTZER PLUS COLD PO) Take by mouth as needed.        . cyclobenzaprine (FLEXERIL) 10 MG tablet 1/2 tablet by mouth in AM and 1-2 tablets by mouth in PM      . DIGESTIVE ENZYMES PO Take by mouth 2 (two) times daily.        . diphenhydrAMINE (BENADRYL) 25 mg capsule Take 25 mg by mouth as needed.        . DULoxetine (CYMBALTA) 60 MG capsule Take 1 capsule (60 mg total) by mouth daily.  90 capsule  3  . Fish Oil-Cholecalciferol (OMEGA-3 FISH OIL/VITAMIN D3) 1000-1000 MG-UNIT CAPS Take by mouth daily.        . GUAIFENESIN CR PO Take by mouth as needed.        . hyoscyamine (LEVSIN SL) 0.125 MG SL tablet Take 1 tablet (0.125 mg total) by mouth every 4 (four) hours as needed for cramping.  90 tablet  1  . levothyroxine  (SYNTHROID, LEVOTHROID) 125 MCG tablet Take  1 tablet (125 mcg total) by mouth daily.  90 tablet  3  . Magnesium 400 MG CAPS Take by mouth. Take 3 by mouth daily       . MAXALT-MLT 10 MG disintegrating tablet TAKE 1 TABLET AS NEEDED AS DIRECTED FOR MIGRAINE  27 tablet  3  . metoprolol (TOPROL-XL) 50 MG 24 hr tablet Take 1 tablet (50 mg total) by mouth daily.  90 tablet  4  . Naproxen Sodium (ALEVE) 220 MG CAPS Take by mouth. Take up to 4 by mouth daily       . NEXIUM 40 MG capsule TAKE 1 CAPSULE TWICE A DAY  180 capsule  3  . nitrofurantoin, macrocrystal-monohydrate, (MACROBID) 100 MG capsule Take 1 tablet by mouth Nightly.      Marland Kitchen oxyCODONE-acetaminophen (PERCOCET) 5-325 MG per tablet Take 1 tablet by mouth every 4 (four) hours as needed.  60 tablet  0  . Probiotic Product (PROBIOTIC FORMULA PO) Take by mouth daily.        . ramipril (ALTACE) 5 MG capsule Take 1 capsule (5 mg total) by mouth daily.  90 capsule  3        Review of Systems Review of Systems  Constitutional: Negative for fever, appetite change,  and unexpected weight change. pos for fatigue Eyes: Negative for pain and visual disturbance.  Respiratory: Negative for cough and shortness of breath.   Cardiovascular: Negative for cp or palpitations    Gastrointestinal: Negative for nausea, diarrhea and constipation.  Genitourinary: Negative for urgency and frequency.  Skin: Negative for pallor or rash   MSK pos for back pain with radiation to leg, neg for swollen joints  Neurological: Negative for weakness, light-headedness, numbness and headaches.  Hematological: Negative for adenopathy. Does not bruise/bleed easily.  Psychiatric/Behavioral: Negative for dysphoric mood. The patient is not nervous/anxious.          Objective:   Physical Exam  BP 110/82  Pulse 76  Temp(Src) 99.2 F (37.3 C) (Oral)  Wt 210 lb (95.255 kg)  Constitutional: She appears well-developed and well-nourished. No distress.       overwt and well  appearing   HENT:  Head: Normocephalic and atraumatic.  Musculoskeletal: She exhibits tenderness. She exhibits no edema.       Lumbar back: She exhibits decreased range of motion, tenderness, bony tenderness and spasm. She exhibits no swelling, no edema and no deformity.       LS - tender over L3- S1 , over bones and over L buttock Pos SLR for thigh pain   Lymphadenopathy:    She has no cervical adenopathy.  Neurological: She is alert. She has normal strength and normal reflexes. She displays no atrophy. No sensory deficit. She exhibits normal muscle tone. Coordination normal.  Skin: Skin is warm and dry.  Psychiatric: She has a normal mood and affect.        Assessment & Plan:   1. Lumbar disc disease with radiculopathy   Deteriorated. Discussed MRI results with her and reason why she was given prednisone--very strong anti inflammatory. Agreed to try Decadron 10 mg IM in office today in lieu of oral prednisone. Advised that she must not take this with NSAIDs. Refilled her Vicodin and advised to keep her appt with spine specialist. The patient indicates understanding of these issues and agrees with the plan.

## 2011-12-22 NOTE — Progress Notes (Signed)
Addended by: Eliezer Bottom on: 12/22/2011 10:02 AM   Modules accepted: Orders

## 2011-12-23 ENCOUNTER — Telehealth: Payer: Self-pay

## 2011-12-23 MED ORDER — OLMESARTAN MEDOXOMIL-HCTZ 40-25 MG PO TABS: 1.0000 | ORAL_TABLET | Freq: Every day | ORAL | Status: DC

## 2011-12-23 NOTE — Telephone Encounter (Signed)
The patient states was given samples of Benicar HCT 40/25 at last office visit 07/29/2012 and now needs a new Rx sent for Benicar HCT 40/25 sent to [facility] for a 30 day supply.  She also [facility]

## 2012-01-05 ENCOUNTER — Telehealth: Payer: Self-pay

## 2012-01-05 NOTE — Telephone Encounter (Signed)
The patient had to change from benicar hct tablets to valsartan/hctz 16/25 mg take one tablet daily per Dr. Mariah Milling request since insurance would not cover the benicar.

## 2012-01-07 ENCOUNTER — Other Ambulatory Visit: Payer: Self-pay | Admitting: *Deleted

## 2012-01-10 ENCOUNTER — Ambulatory Visit: Payer: Managed Care, Other (non HMO)

## 2012-01-10 ENCOUNTER — Other Ambulatory Visit (INDEPENDENT_AMBULATORY_CARE_PROVIDER_SITE_OTHER): Payer: Managed Care, Other (non HMO)

## 2012-01-10 DIAGNOSIS — E119 Type 2 diabetes mellitus without complications: Secondary | ICD-10-CM

## 2012-01-10 DIAGNOSIS — I7300 Raynaud's syndrome without gangrene: Secondary | ICD-10-CM

## 2012-01-10 DIAGNOSIS — E785 Hyperlipidemia, unspecified: Secondary | ICD-10-CM

## 2012-01-10 LAB — COMPREHENSIVE METABOLIC PANEL
ALT: 24 U/L (ref 0–35)
AST: 24 U/L (ref 0–37)
Calcium: 8.9 mg/dL (ref 8.4–10.5)
Chloride: 98 mEq/L (ref 96–112)
Creatinine, Ser: 1 mg/dL (ref 0.4–1.2)
Sodium: 135 mEq/L (ref 135–145)
Total Bilirubin: 0.1 mg/dL — ABNORMAL LOW (ref 0.3–1.2)

## 2012-01-10 LAB — LIPID PANEL
LDL Cholesterol: 127 mg/dL — ABNORMAL HIGH (ref 0–99)
Total CHOL/HDL Ratio: 4
Triglycerides: 107 mg/dL (ref 0.0–149.0)

## 2012-01-14 ENCOUNTER — Ambulatory Visit: Payer: Managed Care, Other (non HMO) | Admitting: Family Medicine

## 2012-01-18 ENCOUNTER — Ambulatory Visit: Payer: Managed Care, Other (non HMO) | Admitting: Family Medicine

## 2012-01-20 ENCOUNTER — Telehealth: Payer: Self-pay

## 2012-01-20 NOTE — Telephone Encounter (Signed)
Sorry, thought she had a follow up ... That is why I signed off on the lab without commend a1c up a bit (sugar) , chol down a bit and crp was normal  Please mail copy Will disc further at next f/u

## 2012-01-20 NOTE — Telephone Encounter (Signed)
Patient advised as instructed via telephone, copy of labs mailed to home address.  Patient stated that she will call and schedule f/u appt after she sees doctor at [facility].

## 2012-01-20 NOTE — Telephone Encounter (Signed)
Pt left v/m requesting recent lab results. If pt does not answer can leave message at [phone]. 01/18/12 appt cancelled and no future appts scheduled at this time.Please advise.

## 2012-01-24 ENCOUNTER — Ambulatory Visit: Payer: Self-pay

## 2012-01-26 ENCOUNTER — Telehealth: Payer: Self-pay | Admitting: Family Medicine

## 2012-01-26 NOTE — Telephone Encounter (Signed)
Will see her at the appt

## 2012-01-26 NOTE — Telephone Encounter (Signed)
Caller: Ota/Patient; PCP: [name]; CB#: [phone];  Call regarding Leg Swelling; Feet and leg edema onset 01/23/12, swelling not going down all the way at night. No known injury. Emergent sx r/o. See in 24 hrs per Edema Protocol. Pt was offered an appt on 01/27/12 with Dr. Reece Agar but she declines. Would prefer to see Dr. Milinda Antis. Appt sched for 01/28/12 @ 1600.

## 2012-01-27 ENCOUNTER — Telehealth: Payer: Self-pay | Admitting: Family Medicine

## 2012-01-27 NOTE — Telephone Encounter (Signed)
Triage Record Num: [id number] Operator: Freddie Breech Patient Name: Tina Berger Call Date & Time: 01/26/2012 4:12:40PM Patient Phone: [phone] PCP: [name]. [facility] Patient Gender: Female PCP Fax : Patient DOB: [birth year] Practice Name: [name] Reason for Call: Caller: Tandra/Patient; PCP: [name]; CB#: [phone]; Call regarding Leg Swelling; Feet and leg edema onset 01/23/12, swelling not going down all the way at night. No known injury. Emergent sx r/o. See in 24 hrs per Edema Protocol. Pt was offered an appt on 01/27/12 with Dr. Reece Agar but she declines. Would prefer to see Dr. Milinda Antis. Appt sched for 01/28/12 @ 1600. Protocol(s) Used: Edema, Atraumatic Recommended Outcome per Protocol: See Provider within 24 hours Reason for Outcome: Edema newly worse than usual pattern OR newly developed in last 12 hours Care Advice: ~ Call provider if symptoms worsen or new symptoms develop. Go to ED IMMEDIATELY if developing increased shortness of breath, continuous cough, worsening fatigue, or unable to perform ADLs. ~ ~ List, or take, all current prescription(s), nonprescription or alternative medication(s) to provider for evaluation. LEG CARE: - Avoid prolonged sitting or standing; take a break to move around every hour or so. - Keep legs raised when sitting, resting or sleeping; when possible raise legs above level of the heart for 20 -30 minutes. - Do not cross your legs. - Wear loose, non-restrictive clothing, especially around waist, groin area and legs. - Consider using support hose if recommended by your provider. ~ 01/26/2012 4:29:59PM Page 1 of 1 [id number]

## 2012-01-28 ENCOUNTER — Encounter: Payer: Self-pay | Admitting: Family Medicine

## 2012-01-28 ENCOUNTER — Ambulatory Visit (INDEPENDENT_AMBULATORY_CARE_PROVIDER_SITE_OTHER): Payer: Managed Care, Other (non HMO) | Admitting: Family Medicine

## 2012-01-28 VITALS — BP 120/86 | HR 82 | Temp 98.2°F | Ht 62.75 in | Wt 210.8 lb

## 2012-01-28 DIAGNOSIS — R600 Localized edema: Secondary | ICD-10-CM

## 2012-01-28 DIAGNOSIS — K209 Esophagitis, unspecified without bleeding: Secondary | ICD-10-CM

## 2012-01-28 DIAGNOSIS — R609 Edema, unspecified: Secondary | ICD-10-CM

## 2012-01-28 MED ORDER — SPIRONOLACTONE 25 MG PO TABS: 25.0000 mg | ORAL_TABLET | Freq: Every day | ORAL | Status: DC

## 2012-01-28 MED ORDER — RANITIDINE HCL 150 MG PO TABS: 150.0000 mg | ORAL_TABLET | Freq: Every day | ORAL | Status: DC

## 2012-01-28 NOTE — Progress Notes (Signed)
Subjective:    Patient ID: Tina Berger, female    DOB: 1951-03-12, 61 y.o.   MRN: [id number]  HPI Is here for swelling of feet and ankles  Happens at the same time every year  Top of feet all the way up to thighs  Is tight   Has been sick with a virus -and gets better when she puts her feet up (entirely dependent)   Has not had very good luck with fluid pills  Is currently on diovan hct  In past lasix causes her to swell   Lots of meds cause her to swell  Cutting back on naproxen did not help   No cp or sob or PND or orthopnea   She also needs refil on 150 zantac for night time use  Also takes nexium bid  Needs the H2 blocker at night - and this helps her voice a lot  No sched EGD at this time Sees ENT rec elevating head of bed No abd pain - even with nsaid (reminded this is not good for GI problems but states she cannot get by without it)--also knows it causes swelling   Lab Results  Component Value Date   K 4.6 01/10/2012     Patient Active Problem List  Diagnoses  . HYPOTHYROIDISM  . DIABETES MELLITUS, TYPE II  . UNSPECIFIED VITAMIN D DEFICIENCY  . HYPERLIPIDEMIA  . ANEMIA-IRON DEFICIENCY  . ANXIETY  . PANIC ATTACK  . DEPRESSION  . KERATOCONJUNCTIVITIS SICCA  . MITRAL VALVE PROLAPSE  . ABNORMAL HEART RHYTHMS  . RAYNAUD'S SYNDROME  . ESOPHAGITIS  . GERD  . GASTRITIS  . IBS  . ROSACEA  . OSTEOARTHRITIS  . BACK PAIN  . PLANTAR FASCIITIS  . FIBROMYALGIA  . OSTEOPENIA  . SOMATIC DYSFUNCTION  . PALPITATIONS  . MIGRAINES, HX OF  . PERSONAL HISTORY ALLERGY UNSPEC MEDICINAL AGENT  . Routine general medical examination at a health care facility  . Hyperkalemia  . Urine frequency  . Drug rash  . HTN (hypertension)  . Raynaud disease  . Obesity  . Other screening mammogram  . Lumbar disc disease with radiculopathy  . Degenerative disk disease  . Pedal edema   Past Medical History  Diagnosis Date  . GERD (gastroesophageal reflux disease)   . HLD  (hyperlipidemia)   . HTN (hypertension)   . Hypothyroidism   . Osteoarthritis     hands  . Anemia, iron deficiency   . Diabetes mellitus type II     Diet controlled  . Edema   . Raynaud's disease   . Fibromyalgia   . Dry eyes   . Recurrent HSV (herpes simplex virus)     lesions in nose  . HNP (herniated nucleus pulposus) 2/99    T6,7,8 with DJD  . DJD (degenerative joint disease)   . Cervical dysplasia     abnormal paps  . Left ovarian cyst     x 3, rupture  . Osteopenia     mild-11/01; improved 12/05  . Diverticulosis   . Hemorrhoids    Past Surgical History  Procedure Date  . Hand surgery   . Thumb joint repair   . Colonoscopy 11/01    Diverticulosis; hemorrhoids   History  Substance Use Topics  . Smoking status: Former Games developer  . Smokeless tobacco: Never Used   Comment: quit over 40 years  . Alcohol Use: Yes     very rarely   Family History  Problem Relation Age of Onset  .  Emphysema Father     + smoker  . Lung cancer Mother     + smoker  . Coronary artery disease Mother     relatively young  . Lymphoma Brother   . Lymphoma Sister   . Diabetes Brother   . Diabetes Sister   . Heart disease Brother    Allergies  Allergen Reactions  . Amitriptyline Hcl     REACTION: sedating  . Atorvastatin     REACTION: muscle  . Benicar (Olmesartan Medoxomil)     Muscle pain   . Cetirizine Hcl     REACTION: headache  . Ciprofloxacin     REACTION: ? rash vs sun rxn  . Clonidine Derivatives     Swelling   . Diltiazem Hcl     REACTION: reaction not known  . Erythromycin     REACTION: reaction not known  . Etodolac     REACTION: reaction not known  . Fluoxetine Hcl     REACTION: stomach problems  . Furosemide     REACTION: swelling  . Gabapentin     REACTION: edema  . Naproxen Sodium     REACTION: edema  . Paroxetine     REACTION: weight gain  . Penicillins     REACTION: reactions to all derivitives  . Pregabalin     REACTION: swelling  .  Sulfonamide Derivatives     REACTION: reaction not known  . Tetracycline     REACTION: reaction not known  . Torsemide     REACTION: swelling  . Venlafaxine     REACTION: sweating  . Keflex Hives and Rash   Current Outpatient Prescriptions on File Prior to Visit  Medication Sig Dispense Refill  . buPROPion (WELLBUTRIN XL) 150 MG 24 hr tablet TAKE 1 TABLET DAILY  90 tablet  2  . Chlorphen-Phenyleph-ASA (ALKA-SELTZER PLUS COLD PO) Take by mouth as needed.        . cyclobenzaprine (FLEXERIL) 10 MG tablet 1/2 tablet by mouth in AM and 1-2 tablets by mouth in PM      . DIGESTIVE ENZYMES PO Take by mouth 2 (two) times daily.        . diphenhydrAMINE (BENADRYL) 25 mg capsule Take 25 mg by mouth as needed.        . DULoxetine (CYMBALTA) 60 MG capsule Take 1 capsule (60 mg total) by mouth daily.  90 capsule  3  . Fish Oil-Cholecalciferol (OMEGA-3 FISH OIL/VITAMIN D3) 1000-1000 MG-UNIT CAPS Take by mouth daily.        . GUAIFENESIN CR PO Take by mouth as needed.        Marland Kitchen HYDROcodone-acetaminophen (VICODIN) 5-500 MG per tablet Take 1 tablet by mouth every 6 (six) hours as needed for pain (watch out for sedation ).  60 tablet  0  . hyoscyamine (LEVSIN SL) 0.125 MG SL tablet Take 1 tablet (0.125 mg total) by mouth every 4 (four) hours as needed for cramping.  90 tablet  1  . levothyroxine (SYNTHROID, LEVOTHROID) 125 MCG tablet Take 1 tablet (125 mcg total) by mouth daily.  90 tablet  3  . Magnesium 400 MG CAPS Take by mouth. Take 3 by mouth daily       . MAXALT-MLT 10 MG disintegrating tablet TAKE 1 TABLET AS NEEDED AS DIRECTED FOR MIGRAINE  27 tablet  3  . metoprolol (TOPROL-XL) 50 MG 24 hr tablet Take 1 tablet (50 mg total) by mouth daily.  90 tablet  4  . Naproxen  Sodium (ALEVE) 220 MG CAPS Take by mouth. Take up to 4 by mouth daily       . NEXIUM 40 MG capsule TAKE 1 CAPSULE TWICE A DAY  180 capsule  3  . nitrofurantoin, macrocrystal-monohydrate, (MACROBID) 100 MG capsule Take 1 tablet by mouth  Nightly.      Marland Kitchen oxyCODONE-acetaminophen (PERCOCET) 5-325 MG per tablet Take 1 tablet by mouth every 4 (four) hours as needed.  60 tablet  0  . Probiotic Product (PROBIOTIC FORMULA PO) Take by mouth daily.        . ramipril (ALTACE) 5 MG capsule Take 1 capsule (5 mg total) by mouth daily.  90 capsule  3  . valsartan-hydrochlorothiazide (DIOVAN HCT) 160-25 MG per tablet Take 1 tablet by mouth daily.  90 tablet  3  . ranitidine (ZANTAC) 150 MG tablet Take 1 tablet (150 mg total) by mouth at bedtime.  30 tablet  11  . spironolactone (ALDACTONE) 25 MG tablet Take 1 tablet (25 mg total) by mouth daily.  30 tablet  11    Review of Systems Review of Systems  Constitutional: Negative for fever, appetite change,  and unexpected weight change. pos for chronic fatigue  Eyes: Negative for pain and visual disturbance.  ENT pos for runny / stuffy nose, getting over a cold  Respiratory: Negative for cough and shortness of breath.   Cardiovascular: Negative for cp or palpitations    Gastrointestinal: Negative for nausea, diarrhea and constipation.  Genitourinary: Negative for urgency and frequency.  Skin: Negative for pallor or rash   MSK pos for chronic pain myofascial  Neurological: Negative for weakness, light-headedness, numbness and headaches.  Hematological: Negative for adenopathy. Does not bruise/bleed easily. pos for cold ext from raynauds Psychiatric/Behavioral: Negative for dysphoric mood. The patient is not nervous/anxious.          Objective:   Physical Exam  Constitutional: She appears well-developed and well-nourished. No distress.       Obese and well appearing   HENT:  Head: Normocephalic and atraumatic.  Mouth/Throat: Oropharynx is clear and moist.  Eyes: Conjunctivae and EOM are normal. Pupils are equal, round, and reactive to light. No scleral icterus.  Neck: Neck supple. No JVD present. Carotid bruit is not present. Erythema present. No thyromegaly present.  Cardiovascular:  Normal rate, regular rhythm, normal heart sounds and intact distal pulses.  Exam reveals no gallop.        Cool toes that are hyperemic - her baseline  Pulmonary/Chest: Effort normal and breath sounds normal. No respiratory distress. She has no wheezes. She has no rales.       No crackles   Abdominal: Soft. Bowel sounds are normal. She exhibits no distension.  Musculoskeletal: She exhibits edema. She exhibits no tenderness.       Trace to one plus edema top of feet   Lymphadenopathy:    She has no cervical adenopathy.  Neurological: She is alert. She has normal reflexes. She exhibits normal muscle tone.  Skin: Skin is warm and dry. No rash noted. No pallor.  Psychiatric: She has a normal mood and affect.       Though does seem fatigued           Assessment & Plan:

## 2012-01-28 NOTE — Patient Instructions (Signed)
For swelling - try the aldactone 25 mg 1 pill in the am  If side effects or problems - stop it and call  Schedule follow up in 2-3 weeks please for visit and labs  Elevate legs when able , keep walking - avoid prolonged standing  Watch sodium in diet  Drink lots of water

## 2012-01-30 NOTE — Assessment & Plan Note (Signed)
Pt needs px for pm Zantac which helps her symptoms in addn to nexium- this helps her voice

## 2012-01-30 NOTE — Assessment & Plan Note (Signed)
No red flags for cardiac dz / this is recurrent with warm weather Vasodilators she takes for her raynaud's also worsens it  So far - no diuretics work and she cannot tol supp hose Trial of aldactone with close f/u and labs Disc lifestyle factors- see AVS

## 2012-01-31 ENCOUNTER — Telehealth: Payer: Self-pay | Admitting: Family Medicine

## 2012-01-31 ENCOUNTER — Ambulatory Visit (INDEPENDENT_AMBULATORY_CARE_PROVIDER_SITE_OTHER): Payer: Managed Care, Other (non HMO) | Admitting: Family Medicine

## 2012-01-31 ENCOUNTER — Encounter: Payer: Self-pay | Admitting: Family Medicine

## 2012-01-31 VITALS — BP 122/86 | HR 84 | Temp 98.4°F | Ht 62.75 in | Wt 211.2 lb

## 2012-01-31 DIAGNOSIS — B9689 Other specified bacterial agents as the cause of diseases classified elsewhere: Secondary | ICD-10-CM | POA: Insufficient documentation

## 2012-01-31 DIAGNOSIS — J019 Acute sinusitis, unspecified: Secondary | ICD-10-CM

## 2012-01-31 MED ORDER — LEVOFLOXACIN 500 MG PO TABS: 500.0000 mg | ORAL_TABLET | Freq: Every day | ORAL | Status: AC

## 2012-01-31 NOTE — Assessment & Plan Note (Signed)
1 week after viral uri with facial pain and purulent nasal drainage Many abx allergies Will tx with levaquin Disc symptomatic care - see instructions on AVS  Update if not starting to improve in a week or if worsening

## 2012-01-31 NOTE — Patient Instructions (Signed)
Drink lots of fluids Use nasal saline spray if it helps  Take the levaquin as directed for sinus infection  Caution with anti inflammatories Get rest

## 2012-01-31 NOTE — Telephone Encounter (Signed)
Will see her today

## 2012-01-31 NOTE — Progress Notes (Signed)
Subjective:    Patient ID: Tina Berger, female    DOB: 1951-09-09, 61 y.o.   MRN: [id number]  HPI Has been sick for over a week - started as a cold Now a lot of facial pain/ teeth pain/ eye pain  Green nasal mucous with blood  Very congested Sore throat - some hoarseness   Not a lot of cough -- is productive   No fever or chills but is tired   Patient Active Problem List  Diagnoses  . HYPOTHYROIDISM  . DIABETES MELLITUS, TYPE II  . UNSPECIFIED VITAMIN D DEFICIENCY  . HYPERLIPIDEMIA  . ANEMIA-IRON DEFICIENCY  . ANXIETY  . PANIC ATTACK  . DEPRESSION  . KERATOCONJUNCTIVITIS SICCA  . MITRAL VALVE PROLAPSE  . ABNORMAL HEART RHYTHMS  . RAYNAUD'S SYNDROME  . ESOPHAGITIS  . GERD  . GASTRITIS  . IBS  . ROSACEA  . OSTEOARTHRITIS  . BACK PAIN  . PLANTAR FASCIITIS  . FIBROMYALGIA  . OSTEOPENIA  . SOMATIC DYSFUNCTION  . PALPITATIONS  . MIGRAINES, HX OF  . PERSONAL HISTORY ALLERGY UNSPEC MEDICINAL AGENT  . Routine general medical examination at a health care facility  . Hyperkalemia  . Urine frequency  . Drug rash  . HTN (hypertension)  . Raynaud disease  . Obesity  . Other screening mammogram  . Lumbar disc disease with radiculopathy  . Degenerative disk disease  . Pedal edema  . Acute bacterial sinusitis   Past Medical History  Diagnosis Date  . GERD (gastroesophageal reflux disease)   . HLD (hyperlipidemia)   . HTN (hypertension)   . Hypothyroidism   . Osteoarthritis     hands  . Anemia, iron deficiency   . Diabetes mellitus type II     Diet controlled  . Edema   . Raynaud's disease   . Fibromyalgia   . Dry eyes   . Recurrent HSV (herpes simplex virus)     lesions in nose  . HNP (herniated nucleus pulposus) 2/99    T6,7,8 with DJD  . DJD (degenerative joint disease)   . Cervical dysplasia     abnormal paps  . Left ovarian cyst     x 3, rupture  . Osteopenia     mild-11/01; improved 12/05  . Diverticulosis   . Hemorrhoids    Past Surgical  History  Procedure Date  . Hand surgery   . Thumb joint repair   . Colonoscopy 11/01    Diverticulosis; hemorrhoids   History  Substance Use Topics  . Smoking status: Former Games developer  . Smokeless tobacco: Never Used   Comment: quit over 40 years  . Alcohol Use: Yes     very rarely   Family History  Problem Relation Age of Onset  . Emphysema Father     + smoker  . Lung cancer Mother     + smoker  . Coronary artery disease Mother     relatively young  . Lymphoma Brother   . Lymphoma Sister   . Diabetes Brother   . Diabetes Sister   . Heart disease Brother    Allergies  Allergen Reactions  . Amitriptyline Hcl     REACTION: sedating  . Atorvastatin     REACTION: muscle  . Benicar (Olmesartan Medoxomil)     Muscle pain   . Cetirizine Hcl     REACTION: headache  . Ciprofloxacin     REACTION: ? rash vs sun rxn  . Clonidine Derivatives     Swelling   .  Diltiazem Hcl     REACTION: reaction not known  . Erythromycin     REACTION: reaction not known  . Etodolac     REACTION: reaction not known  . Fluoxetine Hcl     REACTION: stomach problems  . Furosemide     REACTION: swelling  . Gabapentin     REACTION: edema  . Naproxen Sodium     REACTION: edema  . Paroxetine     REACTION: weight gain  . Penicillins     REACTION: reactions to all derivitives  . Pregabalin     REACTION: swelling  . Sulfonamide Derivatives     REACTION: reaction not known  . Tetracycline     REACTION: reaction not known  . Torsemide     REACTION: swelling  . Venlafaxine     REACTION: sweating  . Keflex Hives and Rash   Current Outpatient Prescriptions on File Prior to Visit  Medication Sig Dispense Refill  . buPROPion (WELLBUTRIN XL) 150 MG 24 hr tablet TAKE 1 TABLET DAILY  90 tablet  2  . Chlorphen-Phenyleph-ASA (ALKA-SELTZER PLUS COLD PO) Take by mouth as needed.        . cyclobenzaprine (FLEXERIL) 10 MG tablet 1/2 tablet by mouth in AM and 1-2 tablets by mouth in PM      .  DIGESTIVE ENZYMES PO Take by mouth 2 (two) times daily.        . diphenhydrAMINE (BENADRYL) 25 mg capsule Take 25 mg by mouth as needed.        . DULoxetine (CYMBALTA) 60 MG capsule Take 1 capsule (60 mg total) by mouth daily.  90 capsule  3  . Fish Oil-Cholecalciferol (OMEGA-3 FISH OIL/VITAMIN D3) 1000-1000 MG-UNIT CAPS Take by mouth daily.        . GUAIFENESIN CR PO Take by mouth as needed.        Marland Kitchen HYDROcodone-acetaminophen (VICODIN) 5-500 MG per tablet Take 1 tablet by mouth every 6 (six) hours as needed for pain (watch out for sedation ).  60 tablet  0  . hyoscyamine (LEVSIN SL) 0.125 MG SL tablet Take 1 tablet (0.125 mg total) by mouth every 4 (four) hours as needed for cramping.  90 tablet  1  . levothyroxine (SYNTHROID, LEVOTHROID) 125 MCG tablet Take 1 tablet (125 mcg total) by mouth daily.  90 tablet  3  . Magnesium 400 MG CAPS Take by mouth. Take 3 by mouth daily       . MAXALT-MLT 10 MG disintegrating tablet TAKE 1 TABLET AS NEEDED AS DIRECTED FOR MIGRAINE  27 tablet  3  . metoprolol (TOPROL-XL) 50 MG 24 hr tablet Take 1 tablet (50 mg total) by mouth daily.  90 tablet  4  . Naproxen Sodium (ALEVE) 220 MG CAPS Take by mouth. Take up to 4 by mouth daily       . NEXIUM 40 MG capsule TAKE 1 CAPSULE TWICE A DAY  180 capsule  3  . nitrofurantoin, macrocrystal-monohydrate, (MACROBID) 100 MG capsule Take 1 tablet by mouth Nightly.      Marland Kitchen oxyCODONE-acetaminophen (PERCOCET) 5-325 MG per tablet Take 1 tablet by mouth every 4 (four) hours as needed.  60 tablet  0  . Probiotic Product (PROBIOTIC FORMULA PO) Take by mouth daily.        . ramipril (ALTACE) 5 MG capsule Take 1 capsule (5 mg total) by mouth daily.  90 capsule  3  . ranitidine (ZANTAC) 150 MG tablet Take 1 tablet (150 mg  total) by mouth at bedtime.  30 tablet  11  . spironolactone (ALDACTONE) 25 MG tablet Take 1 tablet (25 mg total) by mouth daily.  30 tablet  11  . valsartan-hydrochlorothiazide (DIOVAN HCT) 160-25 MG per tablet Take 1  tablet by mouth daily.  90 tablet  3       Review of Systems Review of Systems  Constitutional: Negative for fever, appetite change,  and unexpected weight change.  Eyes: Negative for pain and visual disturbance.  ENT pos for headache and sinus pain and purulent nasal drainage Respiratory: Negative for sob and wheezing .   Cardiovascular: Negative for cp or palpitations    Gastrointestinal: Negative for nausea, diarrhea and constipation.  Genitourinary: Negative for urgency and frequency.  Skin: Negative for pallor or rash   Neurological: Negative for weakness, light-headedness, numbness and headaches.  Hematological: Negative for adenopathy. Does not bruise/bleed easily.  Psychiatric/Behavioral: Negative for dysphoric mood. The patient is not nervous/anxious.          Objective:   Physical Exam  Constitutional: She appears well-developed and well-nourished. No distress.  HENT:  Head: Normocephalic and atraumatic.  Right Ear: External ear normal.  Left Ear: External ear normal.  Mouth/Throat: Oropharynx is clear and moist. No oropharyngeal exudate.       Nares are injected and congested  Bilateral maxillary sinus tenderness Throat- post nasal drip    Eyes: Conjunctivae and EOM are normal. Pupils are equal, round, and reactive to light. Right eye exhibits no discharge. Left eye exhibits no discharge.  Neck: Normal range of motion. Neck supple. No JVD present. No thyromegaly present.  Cardiovascular: Normal rate and regular rhythm.   Pulmonary/Chest: Effort normal and breath sounds normal. No respiratory distress. She has no wheezes.  Lymphadenopathy:    She has no cervical adenopathy.  Neurological: She is alert. No cranial nerve deficit.  Skin: Skin is warm and dry. No rash noted.  Psychiatric: She has a normal mood and affect.          Assessment & Plan:

## 2012-01-31 NOTE — Telephone Encounter (Signed)
Caller: Lace/Patient is calling with a question about Cough Medicine.The medication was written by [name] States seen 01/28/12 for viral URI but now has "full blown sinus infection" with throbbing head, pain around eyes, sore throat and cough. Onset: 01/25/12.  Afebrile.  FBS not tested.  Advised to see MD withn 4 hrs for pain with tenderness below eyes or over sinuses per Diabetes Respiratory Problems guideline.  Appt scheduled for 01/31/12 at 1500 with Dr Milinda Antis.

## 2012-02-13 ENCOUNTER — Telehealth: Payer: Self-pay | Admitting: Family Medicine

## 2012-02-13 DIAGNOSIS — R600 Localized edema: Secondary | ICD-10-CM

## 2012-02-13 DIAGNOSIS — I10 Essential (primary) hypertension: Secondary | ICD-10-CM

## 2012-02-13 NOTE — Telephone Encounter (Signed)
Message copied by Judy Pimple on Sun Feb 13, 2012  8:35 PM ------      Message from: Alvina Chou      Created: Wed Feb 09, 2012  5:21 PM      Regarding: Labs for Monday 4.29       Lab orders, please

## 2012-02-14 ENCOUNTER — Other Ambulatory Visit (INDEPENDENT_AMBULATORY_CARE_PROVIDER_SITE_OTHER): Payer: Managed Care, Other (non HMO)

## 2012-02-14 DIAGNOSIS — R600 Localized edema: Secondary | ICD-10-CM

## 2012-02-14 DIAGNOSIS — R609 Edema, unspecified: Secondary | ICD-10-CM

## 2012-02-14 DIAGNOSIS — I10 Essential (primary) hypertension: Secondary | ICD-10-CM

## 2012-02-14 LAB — RENAL FUNCTION PANEL
CO2: 26 mEq/L (ref 19–32)
Calcium: 9 mg/dL (ref 8.4–10.5)
Potassium: 4.8 mEq/L (ref 3.5–5.1)
Sodium: 136 mEq/L (ref 135–145)

## 2012-02-18 ENCOUNTER — Encounter: Payer: Self-pay | Admitting: Family Medicine

## 2012-02-18 ENCOUNTER — Ambulatory Visit: Payer: Managed Care, Other (non HMO) | Admitting: Cardiovascular Disease

## 2012-02-18 ENCOUNTER — Ambulatory Visit (INDEPENDENT_AMBULATORY_CARE_PROVIDER_SITE_OTHER): Payer: Managed Care, Other (non HMO) | Admitting: Family Medicine

## 2012-02-18 VITALS — BP 120/76 | HR 113 | Temp 97.4°F | Ht 62.75 in | Wt 208.8 lb

## 2012-02-18 DIAGNOSIS — K219 Gastro-esophageal reflux disease without esophagitis: Secondary | ICD-10-CM

## 2012-02-18 DIAGNOSIS — R600 Localized edema: Secondary | ICD-10-CM

## 2012-02-18 DIAGNOSIS — IMO0002 Reserved for concepts with insufficient information to code with codable children: Secondary | ICD-10-CM

## 2012-02-18 DIAGNOSIS — R609 Edema, unspecified: Secondary | ICD-10-CM

## 2012-02-18 MED ORDER — GLUCOSE BLOOD VI STRP: ORAL_STRIP | Status: DC

## 2012-02-18 MED ORDER — RANITIDINE HCL 300 MG PO TABS: 300.0000 mg | ORAL_TABLET | Freq: Every day | ORAL | Status: DC

## 2012-02-18 NOTE — Patient Instructions (Signed)
I'm glad swelling is better  Check in with your pain specialist about pain control  Follow up with me in 6 months Keep watching salt in diet

## 2012-02-18 NOTE — Progress Notes (Signed)
Subjective:    Patient ID: Tina Berger, female    DOB: October 30, 1950, 61 y.o.   MRN: [id number]  HPI Here for f/u of pedal edema and gerd  Also suffering after a nerve root injections -- (had to go off nsaid )- that was really hard -- waiting for that to kick in  Wants more pain med  Started nsaid back yesterday  Went to pain specialist Wednesday- met her wed on ref Liu- at [employer]  (she told him to go back to neurosurgeon at [facility] )  No pain meds now except anti inflam  Cannot return to ortho unless she wants surgery Pain is in neck and upper spine   Last visit - zantac px  Is fairly controlled - but needs higher dose -- needs to inc to 300 at bedtime  That has worked better in past Gets heartburn and occ regurgitation  Also aldactone for pedal edema  In past other diuretics do not work  Also cannot wear supp hose raynauds- so this is tricky  Is on valsartan hct -- from her cardiol - made bp too low - now 1/2 of one  Only took aldactone one day -- and that did not  Work so she stopped it   Patient Active Problem List  Diagnoses  . HYPOTHYROIDISM  . DIABETES MELLITUS, TYPE II  . UNSPECIFIED VITAMIN D DEFICIENCY  . HYPERLIPIDEMIA  . ANEMIA-IRON DEFICIENCY  . ANXIETY  . PANIC ATTACK  . DEPRESSION  . KERATOCONJUNCTIVITIS SICCA  . MITRAL VALVE PROLAPSE  . ABNORMAL HEART RHYTHMS  . RAYNAUD'S SYNDROME  . ESOPHAGITIS  . GERD  . GASTRITIS  . IBS  . ROSACEA  . OSTEOARTHRITIS  . BACK PAIN  . PLANTAR FASCIITIS  . FIBROMYALGIA  . OSTEOPENIA  . SOMATIC DYSFUNCTION  . PALPITATIONS  . MIGRAINES, HX OF  . PERSONAL HISTORY ALLERGY UNSPEC MEDICINAL AGENT  . Routine general medical examination at a health care facility  . Hyperkalemia  . Urine frequency  . Drug rash  . HTN (hypertension)  . Raynaud disease  . Obesity  . Other screening mammogram  . Lumbar disc disease with radiculopathy  . Degenerative disk disease  . Pedal edema  . Acute bacterial sinusitis   Past  Medical History  Diagnosis Date  . GERD (gastroesophageal reflux disease)   . HLD (hyperlipidemia)   . HTN (hypertension)   . Hypothyroidism   . Osteoarthritis     hands  . Anemia, iron deficiency   . Diabetes mellitus type II     Diet controlled  . Edema   . Raynaud's disease   . Fibromyalgia   . Dry eyes   . Recurrent HSV (herpes simplex virus)     lesions in nose  . HNP (herniated nucleus pulposus) 2/99    T6,7,8 with DJD  . DJD (degenerative joint disease)   . Cervical dysplasia     abnormal paps  . Left ovarian cyst     x 3, rupture  . Osteopenia     mild-11/01; improved 12/05  . Diverticulosis   . Hemorrhoids    Past Surgical History  Procedure Date  . Hand surgery   . Thumb joint repair   . Colonoscopy 11/01    Diverticulosis; hemorrhoids   History  Substance Use Topics  . Smoking status: Former Games developer  . Smokeless tobacco: Never Used   Comment: quit over 40 years  . Alcohol Use: Yes     very rarely   Family History  Problem Relation Age of Onset  . Emphysema Father     + smoker  . Lung cancer Mother     + smoker  . Coronary artery disease Mother     relatively young  . Lymphoma Brother   . Lymphoma Sister   . Diabetes Brother   . Diabetes Sister   . Heart disease Brother    Allergies  Allergen Reactions  . Amitriptyline Hcl     REACTION: sedating  . Atorvastatin     REACTION: muscle  . Benicar (Olmesartan Medoxomil)     Muscle pain   . Cetirizine Hcl     REACTION: headache  . Ciprofloxacin     REACTION: ? rash vs sun rxn  . Clonidine Derivatives     Swelling   . Diltiazem Hcl     REACTION: reaction not known  . Erythromycin     REACTION: reaction not known  . Etodolac     REACTION: reaction not known  . Fluoxetine Hcl     REACTION: stomach problems  . Furosemide     REACTION: swelling  . Gabapentin     REACTION: edema  . Naproxen Sodium     REACTION: edema  . Paroxetine     REACTION: weight gain  . Penicillins      REACTION: reactions to all derivitives  . Pregabalin     REACTION: swelling  . Sulfonamide Derivatives     REACTION: reaction not known  . Tetracycline     REACTION: reaction not known  . Torsemide     REACTION: swelling  . Venlafaxine     REACTION: sweating  . Cephalexin Hives and Rash   Current Outpatient Prescriptions on File Prior to Visit  Medication Sig Dispense Refill  . buPROPion (WELLBUTRIN XL) 150 MG 24 hr tablet TAKE 1 TABLET DAILY  90 tablet  2  . Chlorphen-Phenyleph-ASA (ALKA-SELTZER PLUS COLD PO) Take by mouth as needed.        . cyclobenzaprine (FLEXERIL) 10 MG tablet 1/2 tablet by mouth in AM and 1-2 tablets by mouth in PM      . DIGESTIVE ENZYMES PO Take by mouth 2 (two) times daily.        . diphenhydrAMINE (BENADRYL) 25 mg capsule Take 25 mg by mouth as needed.        . DULoxetine (CYMBALTA) 60 MG capsule Take 1 capsule (60 mg total) by mouth daily.  90 capsule  3  . Fish Oil-Cholecalciferol (OMEGA-3 FISH OIL/VITAMIN D3) 1000-1000 MG-UNIT CAPS Take by mouth daily.        . GUAIFENESIN CR PO Take by mouth as needed.        Marland Kitchen HYDROcodone-acetaminophen (VICODIN) 5-500 MG per tablet Take 1 tablet by mouth every 6 (six) hours as needed for pain (watch out for sedation ).  60 tablet  0  . hyoscyamine (LEVSIN SL) 0.125 MG SL tablet Take 1 tablet (0.125 mg total) by mouth every 4 (four) hours as needed for cramping.  90 tablet  1  . levothyroxine (SYNTHROID, LEVOTHROID) 125 MCG tablet Take 1 tablet (125 mcg total) by mouth daily.  90 tablet  3  . Magnesium 400 MG CAPS Take by mouth. Take 3 by mouth daily       . MAXALT-MLT 10 MG disintegrating tablet TAKE 1 TABLET AS NEEDED AS DIRECTED FOR MIGRAINE  27 tablet  3  . metoprolol (TOPROL-XL) 50 MG 24 hr tablet Take 1 tablet (50 mg total) by mouth daily.  90 tablet  4  . Naproxen Sodium (ALEVE) 220 MG CAPS Take by mouth. Take up to 4 by mouth daily       . NEXIUM 40 MG capsule TAKE 1 CAPSULE TWICE A DAY  180 capsule  3  .  nitrofurantoin, macrocrystal-monohydrate, (MACROBID) 100 MG capsule Take 1 tablet by mouth Nightly.      Marland Kitchen oxyCODONE-acetaminophen (PERCOCET) 5-325 MG per tablet Take 1 tablet by mouth every 4 (four) hours as needed.  60 tablet  0  . Probiotic Product (PROBIOTIC FORMULA PO) Take by mouth daily.        . ramipril (ALTACE) 5 MG capsule Take 1 capsule (5 mg total) by mouth daily.  90 capsule  3  . valsartan-hydrochlorothiazide (DIOVAN HCT) 160-25 MG per tablet Take 1 tablet by mouth daily.  90 tablet  3       Review of Systems Review of Systems  Constitutional: Negative for fever, appetite change, fatigue and unexpected weight change.  Eyes: Negative for pain and visual disturbance.  Respiratory: Negative for cough and shortness of breath.   Cardiovascular: Negative for cp or palpitations   pos for pedal edema  Gastrointestinal: Negative for nausea, diarrhea and constipation.  Genitourinary: Negative for urgency and frequency.  Skin: Negative for pallor or rash   MSK pos for chronic widespread pain, now worse in her neck- is severe  Neurological: Negative for weakness, light-headedness, numbness and headaches.  Hematological: Negative for adenopathy. Does not bruise/bleed easily.  Psychiatric/Behavioral: Negative for dysphoric mood. The patient is not nervous/anxious.          Objective:   Physical Exam  Constitutional: She is oriented to person, place, and time. She appears well-developed and well-nourished. No distress.       Obese and fatiged appearing   HENT:  Head: Normocephalic and atraumatic.  Right Ear: External ear normal.  Left Ear: External ear normal.  Mouth/Throat: Oropharynx is clear and moist. No oropharyngeal exudate.  Eyes: Conjunctivae and EOM are normal. Pupils are equal, round, and reactive to light. No scleral icterus.  Neck: Normal range of motion. Neck supple. No JVD present. Carotid bruit is not present. No thyromegaly present.  Cardiovascular: Normal rate,  regular rhythm, normal heart sounds and intact distal pulses.  Exam reveals no gallop.   Pulmonary/Chest: Effort normal and breath sounds normal. No respiratory distress. She has no wheezes.  Abdominal: Soft. Bowel sounds are normal. She exhibits no distension, no abdominal bruit and no mass. There is no tenderness.  Musculoskeletal: She exhibits no edema and no tenderness.       Diffuse myofascial tenderness Poor rom of CS due to pain today  No pedal edema today As usual feet are hyperemic   Lymphadenopathy:    She has no cervical adenopathy.  Neurological: She is alert and oriented to person, place, and time. She has normal reflexes. No cranial nerve deficit. She exhibits normal muscle tone. Coordination normal.  Skin: Skin is warm and dry. No rash noted. No erythema. No pallor.  Psychiatric: She has a normal mood and affect.       Pleasant but seems frustrated and generally down           Assessment & Plan:

## 2012-02-18 NOTE — Assessment & Plan Note (Signed)
Pt had CS inj- hast not started working yet, in much pain, just started nsaid back Exp imp over weekend Adv to call her pain specialist about plan for pain control

## 2012-02-18 NOTE — Assessment & Plan Note (Signed)
No imp with aldactone Has to handle with lifestyle change Less salt in diet Better today

## 2012-02-18 NOTE — Assessment & Plan Note (Signed)
Need to go up to zantac 300 at night  Will change that px  Update if not starting to improve in a week or if worsening

## 2012-03-02 LAB — COMPREHENSIVE METABOLIC PANEL
Albumin: 3.1 g/dL — ABNORMAL LOW (ref 3.4–5.0)
BUN: 24 mg/dL — ABNORMAL HIGH (ref 7–18)
Calcium, Total: 8.2 mg/dL — ABNORMAL LOW (ref 8.5–10.1)
Chloride: 97 mmol/L — ABNORMAL LOW (ref 98–107)
Co2: 24 mmol/L (ref 21–32)
Creatinine: 0.93 mg/dL (ref 0.60–1.30)
EGFR (African American): >60
Glucose: 107 mg/dL — ABNORMAL HIGH (ref 65–99)
Potassium: 4.3 mmol/L (ref 3.5–5.1)
SGOT(AST): 201 U/L — ABNORMAL HIGH (ref 15–37)
SGPT (ALT): 298 U/L — ABNORMAL HIGH
Sodium: 131 mmol/L — ABNORMAL LOW (ref 136–145)
Total Protein: 7.5 g/dL (ref 6.4–8.2)

## 2012-03-02 LAB — CBC
HCT: 38.2 % (ref 35.0–47.0)
MCH: 27.6 pg (ref 26.0–34.0)
MCHC: 31.9 g/dL — ABNORMAL LOW (ref 32.0–36.0)
MCV: 87 fL (ref 80–100)
RBC: 4.42 10*6/uL (ref 3.80–5.20)
RDW: 14.2 % (ref 11.5–14.5)
WBC: 9.8 10*3/uL (ref 3.6–11.0)

## 2012-03-02 LAB — TROPONIN I: Troponin-I: <0.02 ng/mL

## 2012-03-03 ENCOUNTER — Inpatient Hospital Stay: Payer: Self-pay | Admitting: Internal Medicine

## 2012-03-04 LAB — BASIC METABOLIC PANEL
Anion Gap: 9 (ref 7–16)
Co2: 27 mmol/L (ref 21–32)
Glucose: 191 mg/dL — ABNORMAL HIGH (ref 65–99)
Osmolality: 273 (ref 275–301)
Sodium: 133 mmol/L — ABNORMAL LOW (ref 136–145)

## 2012-03-04 LAB — HEPATIC FUNCTION PANEL A (ARMC)
Albumin: 2.8 g/dL — ABNORMAL LOW (ref 3.4–5.0)
Alkaline Phosphatase: 117 U/L (ref 50–136)
Bilirubin,Total: 0.2 mg/dL (ref 0.2–1.0)
Total Protein: 7.1 g/dL (ref 6.4–8.2)

## 2012-03-04 LAB — VANCOMYCIN, TROUGH: Vancomycin, Trough: 12 ug/mL (ref 10–20)

## 2012-03-05 LAB — COMPREHENSIVE METABOLIC PANEL
Albumin: 2.6 g/dL — ABNORMAL LOW (ref 3.4–5.0)
Alkaline Phosphatase: 98 U/L (ref 50–136)
Anion Gap: 8 (ref 7–16)
Bilirubin,Total: 0.2 mg/dL (ref 0.2–1.0)
Chloride: 94 mmol/L — ABNORMAL LOW (ref 98–107)
Co2: 29 mmol/L (ref 21–32)
Creatinine: 0.75 mg/dL (ref 0.60–1.30)
EGFR (Non-African Amer.): >60
Glucose: 161 mg/dL — ABNORMAL HIGH (ref 65–99)
Osmolality: 268 (ref 275–301)
SGOT(AST): 64 U/L — ABNORMAL HIGH (ref 15–37)
SGPT (ALT): 181 U/L — ABNORMAL HIGH
Total Protein: 6.5 g/dL (ref 6.4–8.2)

## 2012-03-06 LAB — COMPREHENSIVE METABOLIC PANEL
Anion Gap: 8 (ref 7–16)
BUN: 21 mg/dL — ABNORMAL HIGH (ref 7–18)
Chloride: 99 mmol/L (ref 98–107)
Creatinine: 0.79 mg/dL (ref 0.60–1.30)
EGFR (African American): >60
Glucose: 138 mg/dL — ABNORMAL HIGH (ref 65–99)
Osmolality: 275 (ref 275–301)
SGOT(AST): 36 U/L (ref 15–37)
Total Protein: 6.7 g/dL (ref 6.4–8.2)

## 2012-03-08 LAB — CULTURE, BLOOD (SINGLE)

## 2012-03-17 ENCOUNTER — Other Ambulatory Visit: Payer: Self-pay | Admitting: *Deleted

## 2012-03-17 MED ORDER — GLUCOSE BLOOD VI STRP: ORAL_STRIP | Status: DC

## 2012-03-17 MED ORDER — RANITIDINE HCL 300 MG PO TABS: 300.0000 mg | ORAL_TABLET | Freq: Every day | ORAL | Status: DC

## 2012-03-21 ENCOUNTER — Encounter: Payer: Self-pay | Admitting: Family Medicine

## 2012-03-21 ENCOUNTER — Ambulatory Visit (INDEPENDENT_AMBULATORY_CARE_PROVIDER_SITE_OTHER): Payer: Managed Care, Other (non HMO) | Admitting: Family Medicine

## 2012-03-21 VITALS — BP 110/72 | HR 94 | Temp 98.3°F | Ht 62.5 in | Wt 206.8 lb

## 2012-03-21 DIAGNOSIS — R7401 Elevation of levels of liver transaminase levels: Secondary | ICD-10-CM

## 2012-03-21 DIAGNOSIS — J310 Chronic rhinitis: Secondary | ICD-10-CM

## 2012-03-21 DIAGNOSIS — R7402 Elevation of levels of lactic acid dehydrogenase (LDH): Secondary | ICD-10-CM

## 2012-03-21 DIAGNOSIS — R748 Abnormal levels of other serum enzymes: Secondary | ICD-10-CM

## 2012-03-21 DIAGNOSIS — J849 Interstitial pulmonary disease, unspecified: Secondary | ICD-10-CM | POA: Insufficient documentation

## 2012-03-21 DIAGNOSIS — J8410 Pulmonary fibrosis, unspecified: Secondary | ICD-10-CM

## 2012-03-21 HISTORY — DX: Abnormal levels of other serum enzymes: R74.8

## 2012-03-21 HISTORY — DX: Chronic rhinitis: J31.0

## 2012-03-21 LAB — HEPATIC FUNCTION PANEL
ALT: 48 U/L — ABNORMAL HIGH (ref 0–35)
AST: 28 U/L (ref 0–37)
Bilirubin, Direct: 0 mg/dL (ref 0.0–0.3)
Total Bilirubin: 0.2 mg/dL — ABNORMAL LOW (ref 0.3–1.2)

## 2012-03-21 MED ORDER — MOMETASONE FUROATE 50 MCG/ACT NA SUSP: 2.0000 | Freq: Every day | NASAL | Status: DC

## 2012-03-21 NOTE — Progress Notes (Signed)
Subjective:    Patient ID: Tina Berger, female    DOB: [birth year], 61 y.o.   MRN: [id number]  HPI Here for f/u of hosp at [facility] 5/1 to 5/21 For acute resp failure/ hypoxia following exp to chlorine  (at a municipal pool indoors) Was at a pool for a birthday party- and had general lung irritation - which progressed to sob  Pulse ox 84% incidental at pain clinic appt - realized there was something wrong and did a chest xray  Found interstitial infiltrates with nl wbc and blood cx  Was put on 02/ steroids/ abx (levaquin/ vanco) and had ref to pulm Dr Park Breed  ? If chem exp or from macrobid or chronic or autoimmune  Saw Dr Park Breed yesterday  Thought this was an ongoing process Did a lot of labs  Will have CT and then bronchoscopy  On prednisone - still on 60 mg  No longer on 02    Also had elevated ast/alt  Neg hep a and b tests Nl liver ultrasound  No jaundice or abd pain  Was not taking any pain med before that  Is on cymbalta   Patient Active Problem List  Diagnoses  . HYPOTHYROIDISM  . DIABETES MELLITUS, TYPE II  . UNSPECIFIED VITAMIN D DEFICIENCY  . HYPERLIPIDEMIA  . ANEMIA-IRON DEFICIENCY  . ANXIETY  . PANIC ATTACK  . DEPRESSION  . KERATOCONJUNCTIVITIS SICCA  . MITRAL VALVE PROLAPSE  . ABNORMAL HEART RHYTHMS  . RAYNAUD'S SYNDROME  . ESOPHAGITIS  . GERD  . GASTRITIS  . IBS  . ROSACEA  . OSTEOARTHRITIS  . BACK PAIN  . PLANTAR FASCIITIS  . FIBROMYALGIA  . OSTEOPENIA  . SOMATIC DYSFUNCTION  . PALPITATIONS  . MIGRAINES, HX OF  . PERSONAL HISTORY ALLERGY UNSPEC MEDICINAL AGENT  . Routine general medical examination at a health care facility  . Hyperkalemia  . Urine frequency  . Drug rash  . HTN (hypertension)  . Raynaud disease  . Obesity  . Other screening mammogram  . Lumbar disc disease with radiculopathy  . Degenerative disk disease  . Pedal edema  . Acute bacterial sinusitis  . Interstitial lung disease  . Elevated liver enzymes  . Rhinitis   Past  Medical History  Diagnosis Date  . GERD (gastroesophageal reflux disease)   . HLD (hyperlipidemia)   . HTN (hypertension)   . Hypothyroidism   . Osteoarthritis     hands  . Anemia, iron deficiency   . Diabetes mellitus type II     Diet controlled  . Edema   . Raynaud's disease   . Fibromyalgia   . Dry eyes   . Recurrent HSV (herpes simplex virus)     lesions in nose  . HNP (herniated nucleus pulposus) 2/99    T6,7,8 with DJD  . DJD (degenerative joint disease)   . Cervical dysplasia     abnormal paps  . Left ovarian cyst     x 3, rupture  . Osteopenia     mild-11/01; improved 12/05  . Diverticulosis   . Hemorrhoids    Past Surgical History  Procedure Date  . Hand surgery   . Thumb joint repair   . Colonoscopy 11/01    Diverticulosis; hemorrhoids   History  Substance Use Topics  . Smoking status: Former Games developer  . Smokeless tobacco: Never Used   Comment: quit over 40 years  . Alcohol Use: Yes     very rarely   Family History  Problem Relation Age  of Onset  . Emphysema Father     + smoker  . Lung cancer Mother     + smoker  . Coronary artery disease Mother     relatively young  . Lymphoma Brother   . Lymphoma Sister   . Diabetes Brother   . Diabetes Sister   . Heart disease Brother    Allergies  Allergen Reactions  . Amitriptyline Hcl     REACTION: sedating  . Atorvastatin     REACTION: muscle  . Benicar (Olmesartan Medoxomil)     Muscle pain   . Cetirizine Hcl     REACTION: headache  . Ciprofloxacin     REACTION: ? rash vs sun rxn  . Clonidine Derivatives     Swelling   . Diltiazem Hcl     REACTION: reaction not known  . Erythromycin     REACTION: reaction not known  . Etodolac     REACTION: reaction not known  . Fluoxetine Hcl     REACTION: stomach problems  . Furosemide     REACTION: swelling  . Gabapentin     REACTION: edema  . Naproxen Sodium     REACTION: edema  . Paroxetine     REACTION: weight gain  . Penicillins      REACTION: reactions to all derivitives  . Pregabalin     REACTION: swelling  . Sulfonamide Derivatives     REACTION: reaction not known  . Tetracycline     REACTION: reaction not known  . Torsemide     REACTION: swelling  . Venlafaxine     REACTION: sweating  . Cephalexin Hives and Rash   Current Outpatient Prescriptions on File Prior to Visit  Medication Sig Dispense Refill  . albuterol (PROVENTIL HFA;VENTOLIN HFA) 108 (90 BASE) MCG/ACT inhaler Inhale 2 puffs into the lungs 3 (three) times daily.      Marland Kitchen buPROPion (WELLBUTRIN XL) 150 MG 24 hr tablet TAKE 1 TABLET DAILY  90 tablet  2  . Chlorphen-Phenyleph-ASA (ALKA-SELTZER PLUS COLD PO) Take by mouth as needed.        . cyclobenzaprine (FLEXERIL) 10 MG tablet 1/2 tablet by mouth in AM and 1-2 tablets by mouth in PM      . DIGESTIVE ENZYMES PO Take by mouth 2 (two) times daily.        . diphenhydrAMINE (BENADRYL) 25 mg capsule Take 25 mg by mouth as needed.        . DULoxetine (CYMBALTA) 60 MG capsule Take 1 capsule (60 mg total) by mouth daily.  90 capsule  3  . fexofenadine (ALLEGRA) 180 MG tablet Take 180 mg by mouth daily.      . Fish Oil-Cholecalciferol (OMEGA-3 FISH OIL/VITAMIN D3) 1000-1000 MG-UNIT CAPS Take by mouth daily.        Marland Kitchen glucose blood test strip One Touch Ultra stripts blue-To check sugar once daily and as needed for DM2 250.00  100 each  3  . GUAIFENESIN CR PO Take by mouth as needed.        . hyoscyamine (LEVSIN SL) 0.125 MG SL tablet Take 1 tablet (0.125 mg total) by mouth every 4 (four) hours as needed for cramping.  90 tablet  1  . levothyroxine (SYNTHROID, LEVOTHROID) 125 MCG tablet Take 1 tablet (125 mcg total) by mouth daily.  90 tablet  3  . Magnesium 400 MG CAPS Take by mouth. Take 3 by mouth daily       . MAXALT-MLT 10 MG disintegrating  tablet TAKE 1 TABLET AS NEEDED AS DIRECTED FOR MIGRAINE  27 tablet  3  . metFORMIN (GLUCOPHAGE) 500 MG tablet Take 500 mg by mouth 2 (two) times daily with a meal.      .  NEXIUM 40 MG capsule TAKE 1 CAPSULE TWICE A DAY  180 capsule  3  . Probiotic Product (PROBIOTIC FORMULA PO) Take by mouth daily.        . ramipril (ALTACE) 5 MG capsule Take 1 capsule (5 mg total) by mouth daily.  90 capsule  3  . ranitidine (ZANTAC) 300 MG tablet Take 1 tablet (300 mg total) by mouth at bedtime.  90 tablet  3  . metoprolol (TOPROL-XL) 50 MG 24 hr tablet Take 1 tablet (50 mg total) by mouth daily.  90 tablet  4  . mometasone (NASONEX) 50 MCG/ACT nasal spray Place 2 sprays into the nose daily.  51 g  3  . Naproxen Sodium (ALEVE) 220 MG CAPS Take by mouth. Take up to 4 by mouth daily       . nitrofurantoin, macrocrystal-monohydrate, (MACROBID) 100 MG capsule Take 1 tablet by mouth Nightly.      Marland Kitchen oxyCODONE-acetaminophen (PERCOCET) 5-325 MG per tablet Take 1 tablet by mouth every 4 (four) hours as needed.  60 tablet  0  . valsartan-hydrochlorothiazide (DIOVAN HCT) 160-25 MG per tablet Take 1 tablet by mouth daily.  90 tablet  3        Review of Systems Review of Systems  Constitutional: Negative for fever, appetite change,and unexpected weight change. pos for fatigue  Eyes: Negative for pain and visual disturbance.  Respiratory: Negative for cough and pos for sob / neg for wheeze  Cardiovascular: Negative for cp or palpitations    Gastrointestinal: Negative for nausea, diarrhea and constipation. neg for abd pain or jaundice  Genitourinary: Negative for urgency and frequency.  Skin: Negative for pallor or rash   MSK pos for pain from fibromyalgia - improved with current medications Neurological: Negative for weakness, light-headedness, numbness and headaches.  Hematological: Negative for adenopathy. Does not bruise/bleed easily.  Psychiatric/Behavioral: Negative for dysphoric mood. The patient is not nervous/anxious.          Objective:   Physical Exam  Constitutional: She appears well-developed and well-nourished. No distress.  HENT:  Head: Normocephalic and  atraumatic.  Mouth/Throat: Oropharynx is clear and moist.  Eyes: Conjunctivae and EOM are normal. Pupils are equal, round, and reactive to light. Right eye exhibits no discharge. Left eye exhibits no discharge. No scleral icterus.  Neck: Normal range of motion. Neck supple. No JVD present. Carotid bruit is not present. No thyromegaly present.  Cardiovascular: Normal rate, regular rhythm, normal heart sounds and intact distal pulses.  Exam reveals no gallop.   Pulmonary/Chest: Effort normal and breath sounds normal. No respiratory distress. She has no wheezes. She has no rales. She exhibits no tenderness.       Harsh bs at bases No crackles or rales   Abdominal: Soft. Bowel sounds are normal. She exhibits no distension, no abdominal bruit and no mass. There is no tenderness.  Musculoskeletal: Normal range of motion. She exhibits no edema and no tenderness.  Lymphadenopathy:    She has no cervical adenopathy.  Neurological: She is alert. She has normal reflexes. No cranial nerve deficit. She exhibits normal muscle tone. Coordination normal.  Skin: Skin is warm and dry. No rash noted. No erythema. No pallor.       Baseline hyperemia of toes /  fingers   Psychiatric: She has a normal mood and affect.          Assessment & Plan:

## 2012-03-21 NOTE — Assessment & Plan Note (Signed)
Undergoing w/u with Dr Park Breed in [place]- labs and then poss CT; bronchoscopy and bx  Will see what results show ? Poss auto immune dz  Is overall feeling better on prednisone Just 02 at night now  Will continue to follow

## 2012-03-21 NOTE — Assessment & Plan Note (Addendum)
This was new in hosp- nl abd ultrasound and hepatitis A and B tests  Rev hospital records and studies in detail No symptoms  Is on cymbalta  Was on pain med with acetaminophen (no more)  Lab today and update

## 2012-03-21 NOTE — Patient Instructions (Addendum)
Continue follow up with Dr Park Breed for lung issues  Also continue prednisone Liver labs today- advise me if you develop abdominal pain or GI symptoms I sent nasonex to your pharmacy

## 2012-03-21 NOTE — Assessment & Plan Note (Signed)
Pt still has runny nose  Will try zyrtec Also nasonex sent to pharmacy-  Will update

## 2012-03-28 ENCOUNTER — Ambulatory Visit: Payer: Managed Care, Other (non HMO) | Admitting: Cardiovascular Disease

## 2012-04-03 ENCOUNTER — Ambulatory Visit: Payer: Self-pay | Admitting: Internal Medicine

## 2012-04-03 ENCOUNTER — Other Ambulatory Visit: Payer: Self-pay | Admitting: Family Medicine

## 2012-04-03 DIAGNOSIS — R945 Abnormal results of liver function studies: Secondary | ICD-10-CM

## 2012-04-03 DIAGNOSIS — R7989 Other specified abnormal findings of blood chemistry: Secondary | ICD-10-CM

## 2012-04-04 ENCOUNTER — Other Ambulatory Visit (INDEPENDENT_AMBULATORY_CARE_PROVIDER_SITE_OTHER): Payer: Managed Care, Other (non HMO)

## 2012-04-04 DIAGNOSIS — R7989 Other specified abnormal findings of blood chemistry: Secondary | ICD-10-CM

## 2012-04-04 DIAGNOSIS — R945 Abnormal results of liver function studies: Secondary | ICD-10-CM

## 2012-04-04 LAB — HEPATIC FUNCTION PANEL
ALT: 31 U/L (ref 0–35)
Total Protein: 6.6 g/dL (ref 6.0–8.3)

## 2012-05-05 ENCOUNTER — Encounter: Payer: Self-pay | Admitting: Gastroenterology

## 2012-05-24 ENCOUNTER — Telehealth: Payer: Self-pay

## 2012-05-24 NOTE — Telephone Encounter (Signed)
Pt said handicap placard has expired. Pt wants permanent request for handicapped placard for degenerative disc disease. Pt cannot walk more than 200 feet without stopping.Please advise..Form is on Dr Royden Purl shelf.

## 2012-05-24 NOTE — Telephone Encounter (Signed)
Patient notified

## 2012-05-24 NOTE — Telephone Encounter (Signed)
Done and in IN box 

## 2012-05-25 ENCOUNTER — Other Ambulatory Visit: Payer: Self-pay

## 2012-05-25 MED ORDER — BUPROPION HCL ER (XL) 150 MG PO TB24: 150.0000 mg | ORAL_TABLET | Freq: Every day | ORAL | Status: DC

## 2012-05-25 NOTE — Telephone Encounter (Signed)
Pt [facility]; pt already has appt 08/2012.Pt notified sent while on phone.

## 2012-05-30 ENCOUNTER — Ambulatory Visit: Payer: Managed Care, Other (non HMO) | Admitting: Gastroenterology

## 2012-06-01 ENCOUNTER — Encounter: Payer: Self-pay | Admitting: Gastroenterology

## 2012-06-01 ENCOUNTER — Ambulatory Visit (INDEPENDENT_AMBULATORY_CARE_PROVIDER_SITE_OTHER): Payer: Managed Care, Other (non HMO) | Admitting: Gastroenterology

## 2012-06-01 VITALS — BP 124/90 | HR 80 | Ht 62.5 in | Wt 214.2 lb

## 2012-06-01 DIAGNOSIS — Z8719 Personal history of other diseases of the digestive system: Secondary | ICD-10-CM

## 2012-06-01 DIAGNOSIS — K224 Dyskinesia of esophagus: Secondary | ICD-10-CM

## 2012-06-01 MED ORDER — HYOSCYAMINE SULFATE 0.125 MG SL SUBL: 0.1250 mg | SUBLINGUAL_TABLET | SUBLINGUAL | Status: DC | PRN

## 2012-06-01 NOTE — Progress Notes (Signed)
History of Present Illness: This is a  61 year old Caucasian female with multiple problems on multiple medications with multiple drug allergies. She currently is recovering from a pulmonary infection and is on tapering doses of corticosteroids. She has a history of chronic GERD managed with twice a day Nexium, but continues to have intermittent esophageal spasms with substernal chest pain relieved with when necessary sublingual Levsin. She is up-to-date on her endoscopy and colonoscopy exams. Currently she denies dysphagia, acid reflux symptoms, or hepatobiliary complaints. She has mild constipation alleviated with when necessary MiraLax, but denies melena or hematochezia or any specific hepatobiliary complaints. Her appetite is good and her weight is stable and she denies a specific food intolerances.    Current Medications, Allergies, Past Medical History, Past Surgical History, Family History and Social History were reviewed in [employer] record.   Assessment and plan: Chronic GERD with intermittent esophageal spasms. I do not think she needs repeat endoscopy or soft tissue manometry at this time since she is doing so well with the above regime. I've renewed her medications and we'll see her on when necessary basis as needed with continue primary care regular followup as scheduled. No diagnosis found.

## 2012-06-01 NOTE — Patient Instructions (Addendum)
We have sent the following medications to your pharmacy: Levsin CC: Dr Roxy Manns

## 2012-06-23 ENCOUNTER — Encounter: Payer: Self-pay | Admitting: Family Medicine

## 2012-06-23 ENCOUNTER — Ambulatory Visit (INDEPENDENT_AMBULATORY_CARE_PROVIDER_SITE_OTHER): Payer: Managed Care, Other (non HMO) | Admitting: Family Medicine

## 2012-06-23 VITALS — BP 118/64 | HR 72 | Temp 98.5°F | Ht 62.0 in | Wt 211.2 lb

## 2012-06-23 DIAGNOSIS — K219 Gastro-esophageal reflux disease without esophagitis: Secondary | ICD-10-CM

## 2012-06-23 DIAGNOSIS — M549 Dorsalgia, unspecified: Secondary | ICD-10-CM

## 2012-06-23 DIAGNOSIS — M949 Disorder of cartilage, unspecified: Secondary | ICD-10-CM

## 2012-06-23 DIAGNOSIS — Z780 Asymptomatic menopausal state: Secondary | ICD-10-CM

## 2012-06-23 DIAGNOSIS — K2990 Gastroduodenitis, unspecified, without bleeding: Secondary | ICD-10-CM

## 2012-06-23 DIAGNOSIS — M899 Disorder of bone, unspecified: Secondary | ICD-10-CM

## 2012-06-23 DIAGNOSIS — IMO0002 Reserved for concepts with insufficient information to code with codable children: Secondary | ICD-10-CM

## 2012-06-23 DIAGNOSIS — K2970 Gastritis, unspecified, without bleeding: Secondary | ICD-10-CM

## 2012-06-23 DIAGNOSIS — E039 Hypothyroidism, unspecified: Secondary | ICD-10-CM

## 2012-06-23 LAB — POCT URINALYSIS DIPSTICK
Leukocytes, UA: NEGATIVE
Protein, UA: NEGATIVE
Urobilinogen, UA: 0.2

## 2012-06-23 MED ORDER — DEXLANSOPRAZOLE 60 MG PO CPDR: 60.0000 mg | DELAYED_RELEASE_CAPSULE | Freq: Every day | ORAL | Status: DC

## 2012-06-23 NOTE — Progress Notes (Signed)
Subjective:    Patient ID: Tina Berger, female    DOB: 1950/11/05, 61 y.o.   MRN: [id number]  HPI Having pain radiating from low back to abdomen- almost in the flank area occ urinary symptoms- at times a bad odor, no dyruia, baseline nocturia 4 times at night (out of habit) , no blood in urine  Cut down to 2 diet sodas per day, and drinking more water  Is going to PT for back pain - at times overdoes it -- and has more pain in thoracic area (middle)  Needs to give a urine specimen  Fibromyalgia is worse lately   Also has hx of esoph spasms (front and back)  When she eats at times, stomach hurts  Nexium/ hyoscomine  She saw Dr Jarold Motto for that - he would not change her therapy- she had a strange interaction with him  Is having anxiety  Thinks that is prednisone related  Pain clinic will not do any more steroids    Patient Active Problem List  Diagnosis  . HYPOTHYROIDISM  . DIABETES MELLITUS, TYPE II  . UNSPECIFIED VITAMIN D DEFICIENCY  . HYPERLIPIDEMIA  . ANEMIA-IRON DEFICIENCY  . ANXIETY  . PANIC ATTACK  . DEPRESSION  . KERATOCONJUNCTIVITIS SICCA  . MITRAL VALVE PROLAPSE  . ABNORMAL HEART RHYTHMS  . RAYNAUD'S SYNDROME  . ESOPHAGITIS  . GERD  . GASTRITIS  . IBS  . ROSACEA  . OSTEOARTHRITIS  . BACK PAIN  . PLANTAR FASCIITIS  . FIBROMYALGIA  . OSTEOPENIA  . SOMATIC DYSFUNCTION  . PALPITATIONS  . MIGRAINES, HX OF  . PERSONAL HISTORY ALLERGY UNSPEC MEDICINAL AGENT  . Routine general medical examination at a health care facility  . Hyperkalemia  . Drug rash  . HTN (hypertension)  . Raynaud disease  . Obesity  . Other screening mammogram  . Lumbar disc disease with radiculopathy  . Degenerative disk disease  . Pedal edema  . Acute bacterial sinusitis  . Interstitial lung disease  . Elevated liver enzymes  . Rhinitis  . Steroid long-term use  . Post-menopausal   Past Medical History  Diagnosis Date  . GERD (gastroesophageal reflux disease)   . HLD  (hyperlipidemia)   . HTN (hypertension)   . Hypothyroidism   . Osteoarthritis     hands  . Anemia, iron deficiency   . Diabetes mellitus type II     Diet controlled  . Edema   . Raynaud's disease   . Fibromyalgia   . Dry eyes   . Recurrent HSV (herpes simplex virus)     lesions in nose  . HNP (herniated nucleus pulposus) 2/99    T6,7,8 with DJD  . DJD (degenerative joint disease)   . Cervical dysplasia     abnormal paps  . Left ovarian cyst     x 3, rupture  . Osteopenia     mild-11/01; improved 12/05  . Diverticulosis   . Hemorrhoids     external  . Interstitial lung disease     ?   Past Surgical History  Procedure Date  . Hand surgery     left thumb  . Colonoscopy 11/01    Diverticulosis; hemorrhoids  . Lasik     bilateral   History  Substance Use Topics  . Smoking status: Former Smoker    Types: Cigarettes  . Smokeless tobacco: Never Used   Comment: quit over 40 years  . Alcohol Use: Yes     very rarely   Family History  Problem Relation Age of Onset  . Emphysema Father     + smoker  . Lung cancer Mother     + smoker  . Coronary artery disease Mother     relatively young  . Lymphoma Brother   . Lymphoma Sister   . Diabetes Brother   . Diabetes Sister   . Heart disease Brother   . Anemia Brother     aplactic    Allergies  Allergen Reactions  . Keflex (Cephalexin) Anaphylaxis  . Penicillins Anaphylaxis  . Amitriptyline Hcl     REACTION: sedating  . Atorvastatin     REACTION: muscle  . Benicar (Olmesartan Medoxomil)     Muscle pain   . Ceftin (Cefuroxime)     Swelling, "legs turn blue"  . Cetirizine Hcl     REACTION: headache  . Ciprofloxacin     REACTION: ? rash vs sun rxn  . Clonidine Derivatives     Swelling   . Diltiazem Hcl     REACTION: reaction not known  . Erythromycin     Rash, swollen gums   . Etodolac     REACTION: reaction not known  . Fluoxetine Hcl     REACTION: stomach problems  . Furosemide     REACTION:  swelling  . Gabapentin     REACTION: edema  . Naproxen Sodium     REACTION: edema  . Paroxetine     REACTION: weight gain  . Pregabalin     REACTION: swelling  . Sulfonamide Derivatives     Rash, swollen gums, lips  . Tetracycline     REACTION:inflammed genitals  . Torsemide     REACTION: swelling  . Venlafaxine     REACTION: sweating  . Cephalexin Hives and Rash   Current Outpatient Prescriptions on File Prior to Visit  Medication Sig Dispense Refill  . albuterol (PROVENTIL HFA;VENTOLIN HFA) 108 (90 BASE) MCG/ACT inhaler Inhale 2 puffs into the lungs 3 (three) times daily as needed.       . budesonide-formoterol (SYMBICORT) 80-4.5 MCG/ACT inhaler Inhale 2 puffs into the lungs 2 (two) times daily.      Marland Kitchen buPROPion (WELLBUTRIN XL) 150 MG 24 hr tablet Take 1 tablet (150 mg total) by mouth daily.  90 tablet  0  . Chlorphen-Phenyleph-ASA (ALKA-SELTZER PLUS COLD PO) Take by mouth as needed.        . cyclobenzaprine (FLEXERIL) 10 MG tablet Take 10 mg by mouth at bedtime.       Marland Kitchen DIGESTIVE ENZYMES PO Take by mouth 2 (two) times daily.        . diphenhydrAMINE (BENADRYL) 25 mg capsule Take 25 mg by mouth as needed.        . DULoxetine (CYMBALTA) 60 MG capsule Take 1 capsule (60 mg total) by mouth daily.  90 capsule  3  . fexofenadine (ALLEGRA) 180 MG tablet Take 180 mg by mouth daily.      . Fish Oil-Cholecalciferol (OMEGA-3 FISH OIL/VITAMIN D3) 1000-1000 MG-UNIT CAPS Take by mouth daily.        Marland Kitchen glucose blood test strip One Touch Ultra stripts blue-To check sugar once daily and as needed for DM2 250.00  100 each  3  . GUAIFENESIN CR PO Take by mouth as needed.        . hyoscyamine (LEVSIN SL) 0.125 MG SL tablet Take 1 tablet (0.125 mg total) by mouth every 4 (four) hours as needed for cramping.  270 tablet  0  . levothyroxine (  SYNTHROID, LEVOTHROID) 125 MCG tablet Take 1 tablet (125 mcg total) by mouth daily.  90 tablet  3  . Magnesium 400 MG CAPS Take by mouth. Take 4 by mouth daily       . MAXALT-MLT 10 MG disintegrating tablet TAKE 1 TABLET AS NEEDED AS DIRECTED FOR MIGRAINE  27 tablet  3  . metFORMIN (GLUCOPHAGE) 500 MG tablet Take 500 mg by mouth 2 (two) times daily with a meal.      . metoprolol (TOPROL-XL) 50 MG 24 hr tablet Take 1 tablet (50 mg total) by mouth daily.  90 tablet  4  . mometasone (NASONEX) 50 MCG/ACT nasal spray Place 2 sprays into the nose daily.  51 g  3  . NEXIUM 40 MG capsule TAKE 1 CAPSULE TWICE A DAY  180 capsule  3  . oxyCODONE (OXYCONTIN) 10 MG 12 hr tablet Take 10 mg by mouth as needed.      Marland Kitchen oxymorphone (OPANA ER) 20 MG 12 hr tablet Take 20 mg by mouth every 12 (twelve) hours.      . polyethylene glycol (MIRALAX / GLYCOLAX) packet Take 17 g by mouth daily.      . predniSONE (DELTASONE) 20 MG tablet currently on 25 mg daily at this time.      . Probiotic Product (PROBIOTIC FORMULA PO) Take by mouth daily.        . ramipril (ALTACE) 5 MG capsule Take 1 capsule (5 mg total) by mouth daily.  90 capsule  3  . ranitidine (ZANTAC) 300 MG tablet Take 1 tablet (300 mg total) by mouth at bedtime.  90 tablet  3  . valsartan (DIOVAN) 160 MG tablet Take 160 mg by mouth daily.      Marland Kitchen dexlansoprazole (DEXILANT) 60 MG capsule Take 1 capsule (60 mg total) by mouth daily.  30 capsule  11          Review of Systems Review of Systems  Constitutional: Negative for fever, appetite change, fatigue and unexpected weight change.  Eyes: Negative for pain and visual disturbance.  Respiratory: Negative for cough and shortness of breath.   Cardiovascular: Negative for cp or palpitations    Gastrointestinal: Negative for nausea, diarrhea and constipation. pos for epigastric pain and heartburn symptoms, neg for blood in stool or dark stool Genitourinary: Negative for urgency and pos for frequency at night, neg for hematuria  Skin: Negative for pallor or rash   MSK pos for back pain- lumbar and thoracic Neurological: Negative for weakness, light-headedness, numbness  and headaches.  Hematological: Negative for adenopathy. Does not bruise/bleed easily.  Psychiatric/Behavioral: Negative for dysphoric mood. The patient is not nervous/anxious.         Objective:   Physical Exam  Constitutional: She appears well-developed and well-nourished. No distress.  HENT:  Head: Normocephalic and atraumatic.  Mouth/Throat: Oropharynx is clear and moist.  Eyes: Conjunctivae and EOM are normal. Pupils are equal, round, and reactive to light. Right eye exhibits no discharge. Left eye exhibits no discharge. No scleral icterus.  Neck: Normal range of motion. Neck supple. No JVD present. Carotid bruit is not present. No thyromegaly present.  Cardiovascular: Normal rate, regular rhythm, normal heart sounds and intact distal pulses.  Exam reveals no gallop.   Pulmonary/Chest: Effort normal and breath sounds normal. No respiratory distress. She has no wheezes. She exhibits no tenderness.  Abdominal: Soft. Bowel sounds are normal. She exhibits no distension, no abdominal bruit and no mass. There is tenderness. There is no  rebound and no guarding.       No suprapubic tenderness or fullness    Very mild epigastric tenderness    Musculoskeletal: She exhibits tenderness. She exhibits no edema.       General myofasical tenderness with trigger points No cva tenderness  Poor rom LS  Neg slr     Lymphadenopathy:    She has no cervical adenopathy.  Neurological: She is alert. She has normal reflexes. No cranial nerve deficit. She exhibits normal muscle tone. Coordination normal.  Skin: Skin is warm and dry. No rash noted. No erythema. No pallor.  Psychiatric: She has a normal mood and affect.          Assessment & Plan:

## 2012-06-23 NOTE — Patient Instructions (Addendum)
Watch your diet for spicy and acidic food Hold the nexium and try dexilant 60 mg once daily -- see which works better for you  Avoid anti inflammatories for stomach Urine test is normal  Drink lots of water  Continue pain clinic visit for your back  We will refer you for bone density test at check out

## 2012-06-25 NOTE — Assessment & Plan Note (Signed)
No change in pain or exam  ua clear Pt will return to pain clinic for further eval

## 2012-06-25 NOTE — Assessment & Plan Note (Signed)
dexa scheduled. °

## 2012-06-25 NOTE — Assessment & Plan Note (Signed)
Likely due to prednisone recently  Will try dexilant instead of nexium  Diet disc  Will update if not imp

## 2012-06-25 NOTE — Assessment & Plan Note (Signed)
Due for dexa- pt was on prednisone for prolonged time- in addn has had D def and is hypothyroid  Scheduled that

## 2012-06-25 NOTE — Assessment & Plan Note (Signed)
This caused inc in anxiety and gastritis symptoms  Expect these to improve now  Also inc risk of OP- dexa was scheduled

## 2012-06-25 NOTE — Assessment & Plan Note (Signed)
From deg disc Neg ua  Will continue pain clinic visits

## 2012-06-25 NOTE — Assessment & Plan Note (Signed)
Worse lately with esoph spasm - suspect due to prednisone Disc diet Will try dexilant to see if this works better than nexium- and update

## 2012-06-27 ENCOUNTER — Telehealth: Payer: Self-pay | Admitting: *Deleted

## 2012-06-27 NOTE — Telephone Encounter (Signed)
Prior Berkley Harvey is needed for dexilant, form is on your shelf.  She has had nexium and zantac in the past.

## 2012-06-27 NOTE — Telephone Encounter (Signed)
Done and in IN box 

## 2012-06-28 NOTE — Telephone Encounter (Signed)
Prior authorization request for Dexilant  faxed to [phone]

## 2012-06-29 ENCOUNTER — Ambulatory Visit (INDEPENDENT_AMBULATORY_CARE_PROVIDER_SITE_OTHER)
Admission: RE | Admit: 2012-06-29 | Discharge: 2012-06-29 | Disposition: A | Discharge status: Home / Self Care | Payer: Managed Care, Other (non HMO) | Source: Ambulatory Visit

## 2012-06-29 DIAGNOSIS — M899 Disorder of bone, unspecified: Secondary | ICD-10-CM

## 2012-06-29 DIAGNOSIS — Z780 Asymptomatic menopausal state: Secondary | ICD-10-CM

## 2012-06-29 DIAGNOSIS — M949 Disorder of cartilage, unspecified: Secondary | ICD-10-CM

## 2012-06-29 DIAGNOSIS — IMO0002 Reserved for concepts with insufficient information to code with codable children: Secondary | ICD-10-CM

## 2012-06-29 DIAGNOSIS — E039 Hypothyroidism, unspecified: Secondary | ICD-10-CM

## 2012-07-04 NOTE — Telephone Encounter (Signed)
Prior auth given dexilant; [facility] already received approval and pt has picked up med. Dr Milinda Antis has already signed approval letter; sending for scanning.

## 2012-07-07 ENCOUNTER — Encounter: Payer: Self-pay | Admitting: Family Medicine

## 2012-07-07 ENCOUNTER — Ambulatory Visit (INDEPENDENT_AMBULATORY_CARE_PROVIDER_SITE_OTHER): Payer: Managed Care, Other (non HMO) | Admitting: Family Medicine

## 2012-07-07 ENCOUNTER — Telehealth: Payer: Self-pay | Admitting: Family Medicine

## 2012-07-07 VITALS — BP 132/86 | HR 88 | Temp 98.9°F | Ht 62.5 in | Wt 218.2 lb

## 2012-07-07 DIAGNOSIS — R609 Edema, unspecified: Secondary | ICD-10-CM

## 2012-07-07 DIAGNOSIS — R600 Localized edema: Secondary | ICD-10-CM

## 2012-07-07 DIAGNOSIS — E039 Hypothyroidism, unspecified: Secondary | ICD-10-CM

## 2012-07-07 LAB — COMPREHENSIVE METABOLIC PANEL
ALT: 26 U/L (ref 0–35)
BUN: 12 mg/dL (ref 6–23)
CO2: 25 mEq/L (ref 19–32)
Calcium: 8.8 mg/dL (ref 8.4–10.5)
Chloride: 101 mEq/L (ref 96–112)
Creatinine, Ser: 0.8 mg/dL (ref 0.4–1.2)
GFR: 73.94 mL/min (ref >60.00)
Glucose, Bld: 115 mg/dL — ABNORMAL HIGH (ref 70–99)
Total Bilirubin: 0.4 mg/dL (ref 0.3–1.2)

## 2012-07-07 LAB — TSH: TSH: 10.4 u[IU]/mL — ABNORMAL HIGH (ref 0.35–5.50)

## 2012-07-07 LAB — BRAIN NATRIURETIC PEPTIDE: Pro B Natriuretic peptide (BNP): 136 pg/mL — ABNORMAL HIGH (ref 0.0–100.0)

## 2012-07-07 NOTE — Telephone Encounter (Signed)
Caller: Jordon/Patient; Patient Name: Tina Berger; PCP: Roxy Manns [facility]); Best Callback Phone Number: [phone];  Calling regarding swelling to both feet that started, worse in left foot, not relieved with elevation. Onset 06/16/12, states she already spoke with somebody this am and note to be given to Dr. Milinda Antis nurse, no note in [facility]. Emergent signs and symptoms ruled out as per Edema, Atraumatic protocol except for see in 24 hours due to edema newly worse than usual pattern. Appointment scheduled with Dr. Milinda Antis at 12:15 07/07/12.

## 2012-07-07 NOTE — Progress Notes (Signed)
  Subjective:    Patient ID: Tina Berger, female    DOB: [birth year], 61 y.o.   MRN: [id number]  HPI Here with swelling of the feet- has had a long long time, but it is getting worse  Usually L leg worse, now R leg   Starting to feel more tight  Worse in the ams than it used to be  Stopped her diovan- thought it made her confused Feels better and bp is better   She does get out of breath more with talking No cp or orthopnea or PND   Sees Dr Park Breed on Tuesday Has had more wheezing   Stopped prednisone before last visit - at lease 3 weeks  No back injections/ no steroid injections Does not use symbacort on a regular basis   In the past you have tried Lasix - swell worse Torsemide- swell worse Aldactone - did not work  No other fluid pills in the past   bp is ok with that bp is stable today  No cp or palpitations or headaches or edema  No side effects to medicines  BP Readings from Last 3 Encounters:  07/07/12 132/86  06/23/12 118/64  06/01/12 124/90     Lab Results  Component Value Date   TSH 1.95 03/31/2011     Review of Systems Review of Systems  Constitutional: Negative for fever, appetite change, and unexpected weight change.  Eyes: Negative for pain and visual disturbance.  Respiratory: Negative for cough and shortness of breath.   Cardiovascular: Negative for cp or palpitations   pos for pedal edema  Gastrointestinal: Negative for nausea, diarrhea and constipation.  Genitourinary: Negative for urgency and frequency.  Skin: Negative for pallor or rash   Neurological: Negative for weakness, light-headedness, numbness and headaches.  Hematological: Negative for adenopathy. Does not bruise/bleed easily.  Psychiatric/Behavioral: Negative for dysphoric mood. The patient is not nervous/anxious.         Objective:   Physical Exam  Constitutional: She appears well-developed and well-nourished. No distress.  HENT:  Head: Normocephalic and atraumatic.  Right  Ear: External ear normal.  Mouth/Throat: Oropharynx is clear and moist.  Eyes: Conjunctivae normal and EOM are normal. Pupils are equal, round, and reactive to light. No scleral icterus.  Neck: Normal range of motion. Neck supple. No JVD present. Carotid bruit is not present. No thyromegaly present.  Cardiovascular: Normal rate, regular rhythm, normal heart sounds and intact distal pulses.  Exam reveals no gallop.   Pulmonary/Chest: Effort normal and breath sounds normal. No respiratory distress. She has no wheezes.  Abdominal: Soft. Bowel sounds are normal. She exhibits no distension, no abdominal bruit and no mass. There is no tenderness.  Musculoskeletal: Normal range of motion. She exhibits edema. She exhibits no tenderness.       One plus pitting edema in akles and top of feet Changes of raynaud's noted  Lymphadenopathy:    She has no cervical adenopathy.  Neurological: She is alert. She has normal reflexes. No cranial nerve deficit. Coordination normal.  Skin: Skin is warm and dry. No rash noted. No erythema. No pallor.  Psychiatric: She has a normal mood and affect.          Assessment & Plan:

## 2012-07-07 NOTE — Patient Instructions (Addendum)
You may have a combination of regular edema (fluid retention) and lymphedema (which is more chronic) Support hose would help if you could wear them Elevate legs when sitting or lying, otherwise keep walking Avoid salt and drink water  Checking thyroid and chemistries and BNP (heart failure) labs today  Since no diuretics have worked so far - that complicates things If all labs were ok - could possibly see if hctz works  Will let you know when labs return

## 2012-07-07 NOTE — Telephone Encounter (Signed)
Pt was seen

## 2012-07-09 ENCOUNTER — Telehealth: Payer: Self-pay | Admitting: Family Medicine

## 2012-07-09 MED ORDER — LEVOTHYROXINE SODIUM 150 MCG PO TABS: 150.0000 ug | ORAL_TABLET | Freq: Every day | ORAL | Status: DC

## 2012-07-09 NOTE — Assessment & Plan Note (Signed)
With increased pedal edema and wt today Check tsh and adv

## 2012-07-09 NOTE — Telephone Encounter (Signed)
tsh is up - so hypothyroidism is likely causing swelling-also causing very mild low sodium level  I am going to inc her thyroid dose and then schedule f/u with me in 3-4 weeks please Her blood test for hart failure (BNP is also elevated very slightly- I'm unsure if this is significant, and we will discuss it at her f/u) Px written for call in

## 2012-07-09 NOTE — Assessment & Plan Note (Signed)
Worsening lately- and no imp with any diuretics tried so far  Lab today Disc the poss of metabolic cause or lymphedema Will make plans based on lab results Pt has not tried hctz yet No red flags for heart failure- will check bmp with labs

## 2012-07-10 ENCOUNTER — Encounter: Payer: Self-pay | Admitting: Family Medicine

## 2012-07-10 NOTE — Telephone Encounter (Signed)
Canceled 08/21/12 appt and notified pt

## 2012-07-10 NOTE — Telephone Encounter (Signed)
She can cancel the 11/4 appt-thanks

## 2012-07-10 NOTE — Telephone Encounter (Signed)
Called in Rx as prescribed. Notified pt of labs results and change of med. Schedule f/u for 07/24/12, but still has 6 month f/u schedule for 08/21/12, does she need to keep this appt?

## 2012-07-11 ENCOUNTER — Encounter: Payer: Self-pay | Admitting: *Deleted

## 2012-07-18 ENCOUNTER — Encounter: Payer: Self-pay | Admitting: Family Medicine

## 2012-07-18 ENCOUNTER — Ambulatory Visit (INDEPENDENT_AMBULATORY_CARE_PROVIDER_SITE_OTHER): Payer: Managed Care, Other (non HMO) | Admitting: Family Medicine

## 2012-07-18 VITALS — BP 128/78 | HR 84 | Temp 99.2°F | Ht 62.5 in | Wt 221.0 lb

## 2012-07-18 DIAGNOSIS — J019 Acute sinusitis, unspecified: Secondary | ICD-10-CM

## 2012-07-18 DIAGNOSIS — I7300 Raynaud's syndrome without gangrene: Secondary | ICD-10-CM

## 2012-07-18 DIAGNOSIS — B9689 Other specified bacterial agents as the cause of diseases classified elsewhere: Secondary | ICD-10-CM

## 2012-07-18 DIAGNOSIS — R600 Localized edema: Secondary | ICD-10-CM

## 2012-07-18 DIAGNOSIS — E039 Hypothyroidism, unspecified: Secondary | ICD-10-CM

## 2012-07-18 DIAGNOSIS — R609 Edema, unspecified: Secondary | ICD-10-CM

## 2012-07-18 MED ORDER — LEVOFLOXACIN 500 MG PO TABS: 500.0000 mg | ORAL_TABLET | Freq: Every day | ORAL | Status: DC

## 2012-07-18 MED ORDER — METFORMIN HCL 500 MG PO TABS: 500.0000 mg | ORAL_TABLET | Freq: Two times a day (BID) | ORAL | Status: DC

## 2012-07-18 NOTE — Assessment & Plan Note (Addendum)
Getting worse despite inc in thyroid dose Can no longer wear shoes  No other symptoms ? If rel to raynauds Ref to rheumatology for further eval  Pt has a long list of c/o incl skin and joint complaints and last rheum w/u was long ago  Anxious to see if there is any autoimmune disease going on  Interestingly - when she was on prednisone for a lung issue lately--- her symptoms did decrease , then edema worsened when she finished it

## 2012-07-18 NOTE — Progress Notes (Signed)
Subjective:    Patient ID: Tina Berger, female    DOB: 07/10/1951, 61 y.o.   MRN: [id number]  HPI Here with sinus symptoms and also swelling in feet   Swelling started getting worse within 1-2 days of coming here Now cannot get shoes on at all  Tight and uncomfortable  tsh was high Lab Results  Component Value Date   TSH 10.40* 07/07/2012   Went up on dose - and not seeming to help yet   Still very puzzled about that   She even tried stopping her oxycodone -no help at all   Sinus symptoms Lot of drainage for a while  Throbbing in sinuses  Feels like she is a little swollen on the R side of her face Clear mucous drainage No fever  Some cough - croupy- with phlegm , that is cloudy/ white  No wheeze  A little bloody nose  Throat a little sore today Ears seem fine  This does not feel like a usual cold to her  Thinks this is chronic allergies then leading to sinus infection Using 12 hour mucinex now   Patient Active Problem List  Diagnosis  . HYPOTHYROIDISM  . DIABETES MELLITUS, TYPE II  . UNSPECIFIED VITAMIN D DEFICIENCY  . HYPERLIPIDEMIA  . ANEMIA-IRON DEFICIENCY  . ANXIETY  . PANIC ATTACK  . DEPRESSION  . KERATOCONJUNCTIVITIS SICCA  . MITRAL VALVE PROLAPSE  . ABNORMAL HEART RHYTHMS  . RAYNAUD'S SYNDROME  . ESOPHAGITIS  . GERD  . GASTRITIS  . IBS  . ROSACEA  . OSTEOARTHRITIS  . BACK PAIN  . PLANTAR FASCIITIS  . FIBROMYALGIA  . OSTEOPENIA  . SOMATIC DYSFUNCTION  . PALPITATIONS  . MIGRAINES, HX OF  . PERSONAL HISTORY ALLERGY UNSPEC MEDICINAL AGENT  . Routine general medical examination at a health care facility  . Hyperkalemia  . Drug rash  . HTN (hypertension)  . Raynaud disease  . Obesity  . Other screening mammogram  . Lumbar disc disease with radiculopathy  . Degenerative disk disease  . Pedal edema  . Acute bacterial sinusitis  . Interstitial lung disease  . Elevated liver enzymes  . Rhinitis  . Steroid long-term use  . Post-menopausal    Past Medical History  Diagnosis Date  . GERD (gastroesophageal reflux disease)   . HLD (hyperlipidemia)   . HTN (hypertension)   . Hypothyroidism   . Osteoarthritis     hands  . Anemia, iron deficiency   . Diabetes mellitus type II     Diet controlled  . Edema   . Raynaud's disease   . Fibromyalgia   . Dry eyes   . Recurrent HSV (herpes simplex virus)     lesions in nose  . HNP (herniated nucleus pulposus) 2/99    T6,7,8 with DJD  . DJD (degenerative joint disease)   . Cervical dysplasia     abnormal paps  . Left ovarian cyst     x 3, rupture  . Osteopenia     mild-11/01; improved 12/05  . Diverticulosis   . Hemorrhoids     external  . Interstitial lung disease     ?   Past Surgical History  Procedure Date  . Hand surgery     left thumb  . Colonoscopy 11/01    Diverticulosis; hemorrhoids  . Lasik     bilateral   History  Substance Use Topics  . Smoking status: Former Smoker    Types: Cigarettes  . Smokeless tobacco: Never Used  Comment: quit over 40 years  . Alcohol Use: No   Family History  Problem Relation Age of Onset  . Emphysema Father     + smoker  . Lung cancer Mother     + smoker  . Coronary artery disease Mother     relatively young  . Lymphoma Brother   . Lymphoma Sister   . Diabetes Brother   . Diabetes Sister   . Heart disease Brother   . Anemia Brother     aplactic    Allergies  Allergen Reactions  . Keflex (Cephalexin) Anaphylaxis  . Penicillins Anaphylaxis  . Amitriptyline Hcl     REACTION: sedating  . Atorvastatin     REACTION: muscle  . Benicar (Olmesartan Medoxomil)     Muscle pain   . Ceftin (Cefuroxime)     Swelling, "legs turn blue"  . Cetirizine Hcl     REACTION: headache  . Ciprofloxacin     REACTION: ? rash vs sun rxn  . Clonidine Derivatives     Swelling   . Diltiazem Hcl     REACTION: reaction not known  . Diovan (Valsartan)     Thought it made her feel confused  . Erythromycin     Rash, swollen  gums   . Etodolac     REACTION: reaction not known  . Fluoxetine Hcl     REACTION: stomach problems  . Furosemide     REACTION: swelling  . Gabapentin     REACTION: edema  . Naproxen Sodium     REACTION: edema  . Paroxetine     REACTION: weight gain  . Pregabalin     REACTION: swelling  . Sulfonamide Derivatives     Rash, swollen gums, lips  . Tetracycline     REACTION:inflammed genitals  . Torsemide     REACTION: swelling  . Venlafaxine     REACTION: sweating  . Cephalexin Hives and Rash   Current Outpatient Prescriptions on File Prior to Visit  Medication Sig Dispense Refill  . albuterol (PROVENTIL HFA;VENTOLIN HFA) 108 (90 BASE) MCG/ACT inhaler Inhale 2 puffs into the lungs 3 (three) times daily as needed.       Marland Kitchen buPROPion (WELLBUTRIN XL) 150 MG 24 hr tablet Take 1 tablet (150 mg total) by mouth daily.  90 tablet  0  . Chlorphen-Phenyleph-ASA (ALKA-SELTZER PLUS COLD PO) Take by mouth as needed.        . cyclobenzaprine (FLEXERIL) 10 MG tablet Take 10 mg by mouth at bedtime.       Marland Kitchen dexlansoprazole (DEXILANT) 60 MG capsule Take 1 capsule (60 mg total) by mouth daily.  30 capsule  11  . DIGESTIVE ENZYMES PO Take by mouth daily.       . diphenhydrAMINE (BENADRYL) 25 mg capsule Take 25 mg by mouth as needed.        . DULoxetine (CYMBALTA) 60 MG capsule Take 1 capsule (60 mg total) by mouth daily.  90 capsule  3  . fexofenadine (ALLEGRA) 180 MG tablet Take 180 mg by mouth daily.       Marland Kitchen glucose blood test strip One Touch Ultra stripts blue-To check sugar once daily and as needed for DM2 250.00  100 each  3  . GUAIFENESIN CR PO Take by mouth as needed.        . hyoscyamine (LEVSIN SL) 0.125 MG SL tablet Take 1 tablet (0.125 mg total) by mouth every 4 (four) hours as needed for cramping.  270 tablet  0  . levothyroxine (SYNTHROID, LEVOTHROID) 150 MCG tablet Take 1 tablet (150 mcg total) by mouth daily.  30 tablet  3  . Magnesium 400 MG CAPS Take by mouth. Take 4 by mouth daily       . MAXALT-MLT 10 MG disintegrating tablet TAKE 1 TABLET AS NEEDED AS DIRECTED FOR MIGRAINE  27 tablet  3  . metFORMIN (GLUCOPHAGE) 500 MG tablet Take 500 mg by mouth 2 (two) times daily with a meal.      . metoprolol (TOPROL-XL) 50 MG 24 hr tablet Take 1 tablet (50 mg total) by mouth daily.  90 tablet  4  . mometasone (NASONEX) 50 MCG/ACT nasal spray Place 2 sprays into the nose daily.  51 g  3  . oxyCODONE (OXYCONTIN) 10 MG 12 hr tablet Take 10 mg by mouth 2 (two) times daily.       Marland Kitchen oxymorphone (OPANA ER) 20 MG 12 hr tablet Take 20 mg by mouth every 12 (twelve) hours.      . polyethylene glycol (MIRALAX / GLYCOLAX) packet Take 17 g by mouth as needed.       . ramipril (ALTACE) 5 MG capsule Take 1 capsule (5 mg total) by mouth daily.  90 capsule  3  . ranitidine (ZANTAC) 300 MG tablet Take 1 tablet (300 mg total) by mouth at bedtime.  90 tablet  3  . budesonide-formoterol (SYMBICORT) 80-4.5 MCG/ACT inhaler Inhale 2 puffs into the lungs as needed.       . predniSONE (DELTASONE) 20 MG tablet currently on 25 mg daily at this time.          Review of Systems Patient Active Problem List  Diagnosis  . HYPOTHYROIDISM  . DIABETES MELLITUS, TYPE II  . UNSPECIFIED VITAMIN D DEFICIENCY  . HYPERLIPIDEMIA  . ANEMIA-IRON DEFICIENCY  . ANXIETY  . PANIC ATTACK  . DEPRESSION  . KERATOCONJUNCTIVITIS SICCA  . MITRAL VALVE PROLAPSE  . ABNORMAL HEART RHYTHMS  . RAYNAUD'S SYNDROME  . ESOPHAGITIS  . GERD  . GASTRITIS  . IBS  . ROSACEA  . OSTEOARTHRITIS  . BACK PAIN  . PLANTAR FASCIITIS  . FIBROMYALGIA  . OSTEOPENIA  . SOMATIC DYSFUNCTION  . PALPITATIONS  . MIGRAINES, HX OF  . PERSONAL HISTORY ALLERGY UNSPEC MEDICINAL AGENT  . Routine general medical examination at a health care facility  . Hyperkalemia  . Drug rash  . HTN (hypertension)  . Raynaud disease  . Obesity  . Other screening mammogram  . Lumbar disc disease with radiculopathy  . Degenerative disk disease  . Pedal edema  .  Acute bacterial sinusitis  . Interstitial lung disease  . Elevated liver enzymes  . Rhinitis  . Steroid long-term use  . Post-menopausal   Past Medical History  Diagnosis Date  . GERD (gastroesophageal reflux disease)   . HLD (hyperlipidemia)   . HTN (hypertension)   . Hypothyroidism   . Osteoarthritis     hands  . Anemia, iron deficiency   . Diabetes mellitus type II     Diet controlled  . Edema   . Raynaud's disease   . Fibromyalgia   . Dry eyes   . Recurrent HSV (herpes simplex virus)     lesions in nose  . HNP (herniated nucleus pulposus) 2/99    T6,7,8 with DJD  . DJD (degenerative joint disease)   . Cervical dysplasia     abnormal paps  . Left ovarian cyst     x 3, rupture  .  Osteopenia     mild-11/01; improved 12/05  . Diverticulosis   . Hemorrhoids     external  . Interstitial lung disease     ?   Past Surgical History  Procedure Date  . Hand surgery     left thumb  . Colonoscopy 11/01    Diverticulosis; hemorrhoids  . Lasik     bilateral   History  Substance Use Topics  . Smoking status: Former Smoker    Types: Cigarettes  . Smokeless tobacco: Never Used   Comment: quit over 40 years  . Alcohol Use: No   Family History  Problem Relation Age of Onset  . Emphysema Father     + smoker  . Lung cancer Mother     + smoker  . Coronary artery disease Mother     relatively young  . Lymphoma Brother   . Lymphoma Sister   . Diabetes Brother   . Diabetes Sister   . Heart disease Brother   . Anemia Brother     aplactic    Allergies  Allergen Reactions  . Keflex (Cephalexin) Anaphylaxis  . Penicillins Anaphylaxis  . Amitriptyline Hcl     REACTION: sedating  . Atorvastatin     REACTION: muscle  . Benicar (Olmesartan Medoxomil)     Muscle pain   . Ceftin (Cefuroxime)     Swelling, "legs turn blue"  . Cetirizine Hcl     REACTION: headache  . Ciprofloxacin     REACTION: ? rash vs sun rxn  . Clonidine Derivatives     Swelling   .  Diltiazem Hcl     REACTION: reaction not known  . Diovan (Valsartan)     Thought it made her feel confused  . Erythromycin     Rash, swollen gums   . Etodolac     REACTION: reaction not known  . Fluoxetine Hcl     REACTION: stomach problems  . Furosemide     REACTION: swelling  . Gabapentin     REACTION: edema  . Naproxen Sodium     REACTION: edema  . Paroxetine     REACTION: weight gain  . Pregabalin     REACTION: swelling  . Sulfonamide Derivatives     Rash, swollen gums, lips  . Tetracycline     REACTION:inflammed genitals  . Torsemide     REACTION: swelling  . Venlafaxine     REACTION: sweating  . Cephalexin Hives and Rash   Current Outpatient Prescriptions on File Prior to Visit  Medication Sig Dispense Refill  . albuterol (PROVENTIL HFA;VENTOLIN HFA) 108 (90 BASE) MCG/ACT inhaler Inhale 2 puffs into the lungs 3 (three) times daily as needed.       Marland Kitchen buPROPion (WELLBUTRIN XL) 150 MG 24 hr tablet Take 1 tablet (150 mg total) by mouth daily.  90 tablet  0  . Chlorphen-Phenyleph-ASA (ALKA-SELTZER PLUS COLD PO) Take by mouth as needed.        . cyclobenzaprine (FLEXERIL) 10 MG tablet Take 10 mg by mouth at bedtime.       Marland Kitchen dexlansoprazole (DEXILANT) 60 MG capsule Take 1 capsule (60 mg total) by mouth daily.  30 capsule  11  . DIGESTIVE ENZYMES PO Take by mouth daily.       . diphenhydrAMINE (BENADRYL) 25 mg capsule Take 25 mg by mouth as needed.        . DULoxetine (CYMBALTA) 60 MG capsule Take 1 capsule (60 mg total) by mouth daily.  90  capsule  3  . fexofenadine (ALLEGRA) 180 MG tablet Take 180 mg by mouth daily.       Marland Kitchen glucose blood test strip One Touch Ultra stripts blue-To check sugar once daily and as needed for DM2 250.00  100 each  3  . GUAIFENESIN CR PO Take by mouth as needed.        . hyoscyamine (LEVSIN SL) 0.125 MG SL tablet Take 1 tablet (0.125 mg total) by mouth every 4 (four) hours as needed for cramping.  270 tablet  0  . levothyroxine (SYNTHROID,  LEVOTHROID) 150 MCG tablet Take 1 tablet (150 mcg total) by mouth daily.  30 tablet  3  . Magnesium 400 MG CAPS Take by mouth. Take 4 by mouth daily      . MAXALT-MLT 10 MG disintegrating tablet TAKE 1 TABLET AS NEEDED AS DIRECTED FOR MIGRAINE  27 tablet  3  . metFORMIN (GLUCOPHAGE) 500 MG tablet Take 500 mg by mouth 2 (two) times daily with a meal.      . metoprolol (TOPROL-XL) 50 MG 24 hr tablet Take 1 tablet (50 mg total) by mouth daily.  90 tablet  4  . mometasone (NASONEX) 50 MCG/ACT nasal spray Place 2 sprays into the nose daily.  51 g  3  . oxyCODONE (OXYCONTIN) 10 MG 12 hr tablet Take 10 mg by mouth 2 (two) times daily.       Marland Kitchen oxymorphone (OPANA ER) 20 MG 12 hr tablet Take 20 mg by mouth every 12 (twelve) hours.      . polyethylene glycol (MIRALAX / GLYCOLAX) packet Take 17 g by mouth as needed.       . ramipril (ALTACE) 5 MG capsule Take 1 capsule (5 mg total) by mouth daily.  90 capsule  3  . ranitidine (ZANTAC) 300 MG tablet Take 1 tablet (300 mg total) by mouth at bedtime.  90 tablet  3  . budesonide-formoterol (SYMBICORT) 80-4.5 MCG/ACT inhaler Inhale 2 puffs into the lungs as needed.       . predniSONE (DELTASONE) 20 MG tablet currently on 25 mg daily at this time.          Review of Systems  Constitutional: Negative for fever, appetite change,  and unexpected weight change.  Eyes: Negative for pain and visual disturbance.  ENT pos for cong/ rhinorrhea/ post nasal drip and sinus pain , pos for ear fullness Respiratory: Negative for sob or wheeze  Cardiovascular: Negative for cp or palpitations    Gastrointestinal: Negative for nausea, diarrhea and constipation.  Genitourinary: Negative for urgency and frequency.  Skin: Negative for pallor or rash   MSK pos for diffuse pain in joints on and off/ pos for swelling of legs and feet/ pos for raynauds dz Neurological: Negative for weakness, light-headedness, numbness and headaches.  Hematological: Negative for adenopathy. Does not  bruise/bleed easily.  Psychiatric/Behavioral: Negative for dysphoric mood. The patient is not nervous/anxious.      Objective:   Physical Exam  Constitutional: She appears well-developed and well-nourished. No distress.       Obese and well appearing  HENT:  Head: Normocephalic and atraumatic.  Right Ear: External ear normal.  Left Ear: External ear normal.  Mouth/Throat: Oropharynx is clear and moist. No oropharyngeal exudate.       Nares are injected and congested  Bilateral maxillary sinus tenderness worse on the R side Clear post nasal drip    Eyes: Conjunctivae normal and EOM are normal. Pupils are equal, round,  and reactive to light. Right eye exhibits no discharge. Left eye exhibits no discharge. No scleral icterus.  Neck: No JVD present. No thyromegaly present.  Cardiovascular: Normal rate, regular rhythm, normal heart sounds and intact distal pulses.  Exam reveals no gallop.   Pulmonary/Chest: Effort normal and breath sounds normal. No respiratory distress. She has no wheezes. She has no rales.  Musculoskeletal: She exhibits edema and tenderness.       Edema noted on top of feet and in ankles with trace to one plus for pitting  Baseline color changes in feet from raynaud's  Pulses are palpable  No calf tenderness Neg homan's sign No palpable cords   Lymphadenopathy:    She has no cervical adenopathy.  Neurological: She is alert. She has normal reflexes. She exhibits normal muscle tone.  Skin: Skin is warm and dry. No rash noted.  Psychiatric: She has a normal mood and affect.          Assessment & Plan:

## 2012-07-18 NOTE — Assessment & Plan Note (Addendum)
Did inc her med for high tsh  No imp in edema yet - suspected that this could be possible myxedema- but now since not improving with inc in dose of thyroid supplement, less likely  F/u lab planned Feels ok

## 2012-07-18 NOTE — Assessment & Plan Note (Signed)
This is stable but pt is having more and more edema - in feet only , without other symptoms Rev labs  Will ref to rheum

## 2012-07-18 NOTE — Assessment & Plan Note (Signed)
tx with levaquin (pt can take this abx)  Disc symptomatic care - see instructions on AVS  Update if not starting to improve in a week or if worsening

## 2012-07-18 NOTE — Patient Instructions (Signed)
Take the levaquin for sinus infection  Drink lots of water  Use nasal saline spray for congestion We will do rheumatology ref at check out

## 2012-07-24 ENCOUNTER — Ambulatory Visit: Payer: Managed Care, Other (non HMO) | Admitting: Family Medicine

## 2012-07-24 ENCOUNTER — Other Ambulatory Visit: Payer: Self-pay | Admitting: Family Medicine

## 2012-07-24 NOTE — Telephone Encounter (Signed)
Ok to refill 

## 2012-07-24 NOTE — Telephone Encounter (Signed)
She can have 180 with 1 refil-thanks

## 2012-08-01 ENCOUNTER — Other Ambulatory Visit: Payer: Self-pay | Admitting: Cardiovascular Disease

## 2012-08-02 ENCOUNTER — Other Ambulatory Visit: Payer: Self-pay | Admitting: *Deleted

## 2012-08-02 ENCOUNTER — Telehealth: Payer: Self-pay | Admitting: *Deleted

## 2012-08-02 ENCOUNTER — Encounter: Payer: Self-pay | Admitting: Family Medicine

## 2012-08-02 MED ORDER — METFORMIN HCL 500 MG PO TABS: 500.0000 mg | ORAL_TABLET | Freq: Two times a day (BID) | ORAL | Status: DC

## 2012-08-02 MED ORDER — DEXLANSOPRAZOLE 60 MG PO CPDR: 60.0000 mg | DELAYED_RELEASE_CAPSULE | Freq: Every day | ORAL | Status: DC

## 2012-08-02 NOTE — Telephone Encounter (Signed)
Pt pharmacy requesting refill for Metoprolol and pt has not been seen over a year. Spoke with pt and she mentioned that she was going to contact her primary care doctor and see if she will refill medication if not she will return call and schedule appointment to be seen. Refill not sent in for Metoprolol until she returns call.

## 2012-08-03 ENCOUNTER — Encounter: Payer: Self-pay | Admitting: Family Medicine

## 2012-08-03 ENCOUNTER — Ambulatory Visit (INDEPENDENT_AMBULATORY_CARE_PROVIDER_SITE_OTHER): Payer: Managed Care, Other (non HMO) | Admitting: Family Medicine

## 2012-08-03 VITALS — BP 130/80 | HR 100 | Temp 99.1°F | Wt 213.0 lb

## 2012-08-03 DIAGNOSIS — R059 Cough, unspecified: Secondary | ICD-10-CM

## 2012-08-03 DIAGNOSIS — J019 Acute sinusitis, unspecified: Secondary | ICD-10-CM

## 2012-08-03 DIAGNOSIS — R05 Cough: Secondary | ICD-10-CM

## 2012-08-03 DIAGNOSIS — B9689 Other specified bacterial agents as the cause of diseases classified elsewhere: Secondary | ICD-10-CM

## 2012-08-03 MED ORDER — HYDROCOD POLST-CHLORPHEN POLST 10-8 MG/5ML PO LQCR: 5.0000 mL | Freq: Every evening | ORAL | Status: DC | PRN

## 2012-08-03 MED ORDER — LEVOFLOXACIN 500 MG PO TABS: 500.0000 mg | ORAL_TABLET | Freq: Every day | ORAL | Status: DC

## 2012-08-03 NOTE — Patient Instructions (Addendum)
Good to see you. Please take Levaquin as directed.  Tussionex as needed for cough.

## 2012-08-03 NOTE — Progress Notes (Signed)
Subjective:    Patient ID: Tina Berger, female    DOB: [birth year], 61 y.o.   MRN: [id number]  61 yo pt of Dr. Milinda Antis with complicated history, including multiple drug allergies and hospital admission this summer for PNA/?interstitial lung disease here for productive cough and fever for past few days. Throat is now hurting as well.  Treated for sinusitis by Dr. Milinda Antis last month.  No SOB or CP. No n/v/d.  Patient Active Problem List  Diagnosis  . HYPOTHYROIDISM  . DIABETES MELLITUS, TYPE II  . UNSPECIFIED VITAMIN D DEFICIENCY  . HYPERLIPIDEMIA  . ANEMIA-IRON DEFICIENCY  . ANXIETY  . PANIC ATTACK  . DEPRESSION  . KERATOCONJUNCTIVITIS SICCA  . MITRAL VALVE PROLAPSE  . ABNORMAL HEART RHYTHMS  . RAYNAUD'S SYNDROME  . ESOPHAGITIS  . GERD  . GASTRITIS  . IBS  . ROSACEA  . OSTEOARTHRITIS  . BACK PAIN  . PLANTAR FASCIITIS  . FIBROMYALGIA  . OSTEOPENIA  . SOMATIC DYSFUNCTION  . PALPITATIONS  . MIGRAINES, HX OF  . PERSONAL HISTORY ALLERGY UNSPEC MEDICINAL AGENT  . Routine general medical examination at a health care facility  . Hyperkalemia  . Drug rash  . HTN (hypertension)  . Raynaud disease  . Obesity  . Other screening mammogram  . Lumbar disc disease with radiculopathy  . Degenerative disk disease  . Pedal edema  . Acute bacterial sinusitis  . Interstitial lung disease  . Elevated liver enzymes  . Rhinitis  . Steroid long-term use  . Post-menopausal   Past Medical History  Diagnosis Date  . GERD (gastroesophageal reflux disease)   . HLD (hyperlipidemia)   . HTN (hypertension)   . Hypothyroidism   . Osteoarthritis     hands  . Anemia, iron deficiency   . Diabetes mellitus type II     Diet controlled  . Edema   . Raynaud's disease   . Fibromyalgia   . Dry eyes   . Recurrent HSV (herpes simplex virus)     lesions in nose  . HNP (herniated nucleus pulposus) 2/99    T6,7,8 with DJD  . DJD (degenerative joint disease)   . Cervical dysplasia    abnormal paps  . Left ovarian cyst     x 3, rupture  . Osteopenia     mild-11/01; improved 12/05  . Diverticulosis   . Hemorrhoids     external  . Interstitial lung disease     ?   Past Surgical History  Procedure Date  . Hand surgery     left thumb  . Colonoscopy 11/01    Diverticulosis; hemorrhoids  . Lasik     bilateral   History  Substance Use Topics  . Smoking status: Former Smoker    Types: Cigarettes  . Smokeless tobacco: Never Used   Comment: quit over 40 years  . Alcohol Use: No   Family History  Problem Relation Age of Onset  . Emphysema Father     + smoker  . Lung cancer Mother     + smoker  . Coronary artery disease Mother     relatively young  . Lymphoma Brother   . Lymphoma Sister   . Diabetes Brother   . Diabetes Sister   . Heart disease Brother   . Anemia Brother     aplactic    Allergies  Allergen Reactions  . Keflex (Cephalexin) Anaphylaxis  . Penicillins Anaphylaxis  . Amitriptyline Hcl     REACTION: sedating  . Atorvastatin  REACTION: muscle  . Benicar (Olmesartan Medoxomil)     Muscle pain   . Ceftin (Cefuroxime)     Swelling, "legs turn blue"  . Cetirizine Hcl     REACTION: headache  . Ciprofloxacin     REACTION: ? rash vs sun rxn  . Clonidine Derivatives     Swelling   . Diltiazem Hcl     REACTION: reaction not known  . Diovan (Valsartan)     Thought it made her feel confused  . Erythromycin     Rash, swollen gums   . Etodolac     REACTION: reaction not known  . Fluoxetine Hcl     REACTION: stomach problems  . Furosemide     REACTION: swelling  . Gabapentin     REACTION: edema  . Naproxen Sodium     REACTION: edema  . Paroxetine     REACTION: weight gain  . Pregabalin     REACTION: swelling  . Sulfonamide Derivatives     Rash, swollen gums, lips  . Tetracycline     REACTION:inflammed genitals  . Torsemide     REACTION: swelling  . Venlafaxine     REACTION: sweating  . Cephalexin Hives and Rash    Current Outpatient Prescriptions on File Prior to Visit  Medication Sig Dispense Refill  . albuterol (PROVENTIL HFA;VENTOLIN HFA) 108 (90 BASE) MCG/ACT inhaler Inhale 2 puffs into the lungs 3 (three) times daily as needed.       . budesonide-formoterol (SYMBICORT) 80-4.5 MCG/ACT inhaler Inhale 2 puffs into the lungs as needed.       Marland Kitchen buPROPion (WELLBUTRIN XL) 150 MG 24 hr tablet Take 1 tablet (150 mg total) by mouth daily.  90 tablet  0  . Chlorphen-Phenyleph-ASA (ALKA-SELTZER PLUS COLD PO) Take by mouth as needed.        . cyclobenzaprine (FLEXERIL) 10 MG tablet Take 10 mg by mouth at bedtime.       . cyclobenzaprine (FLEXERIL) 10 MG tablet TAKE ONE-HALF TO ONE TABLET BY MOUTH DAILY AND ONE TABLET NIGHTLY ATBEDTIME  180 tablet  1  . dexlansoprazole (DEXILANT) 60 MG capsule Take 1 capsule (60 mg total) by mouth daily.  90 capsule  3  . DIGESTIVE ENZYMES PO Take by mouth daily.       . diphenhydrAMINE (BENADRYL) 25 mg capsule Take 25 mg by mouth as needed.        . DULoxetine (CYMBALTA) 60 MG capsule Take 1 capsule (60 mg total) by mouth daily.  90 capsule  3  . fexofenadine (ALLEGRA) 180 MG tablet Take 180 mg by mouth daily.       Marland Kitchen glucose blood test strip One Touch Ultra stripts blue-To check sugar once daily and as needed for DM2 250.00  100 each  3  . GUAIFENESIN CR PO Take by mouth as needed.        . hyoscyamine (LEVSIN SL) 0.125 MG SL tablet Take 1 tablet (0.125 mg total) by mouth every 4 (four) hours as needed for cramping.  270 tablet  0  . KRILL OIL PO Take by mouth daily.      Marland Kitchen levofloxacin (LEVAQUIN) 500 MG tablet Take 1 tablet (500 mg total) by mouth daily.  7 tablet  0  . levothyroxine (SYNTHROID, LEVOTHROID) 150 MCG tablet Take 1 tablet (150 mcg total) by mouth daily.  30 tablet  3  . Magnesium 400 MG CAPS Take by mouth. Take 4 by mouth daily      .  MAXALT-MLT 10 MG disintegrating tablet TAKE 1 TABLET AS NEEDED AS DIRECTED FOR MIGRAINE  27 tablet  3  . metFORMIN (GLUCOPHAGE)  500 MG tablet Take 1 tablet (500 mg total) by mouth 2 (two) times daily with a meal.  180 tablet  3  . metoprolol (TOPROL-XL) 50 MG 24 hr tablet Take 1 tablet (50 mg total) by mouth daily.  90 tablet  4  . mometasone (NASONEX) 50 MCG/ACT nasal spray Place 2 sprays into the nose daily.  51 g  3  . oxyCODONE (OXYCONTIN) 10 MG 12 hr tablet Take 10 mg by mouth 2 (two) times daily.       Marland Kitchen oxymorphone (OPANA ER) 20 MG 12 hr tablet Take 20 mg by mouth every 12 (twelve) hours.      . polyethylene glycol (MIRALAX / GLYCOLAX) packet Take 17 g by mouth as needed.       . ramipril (ALTACE) 5 MG capsule Take 1 capsule (5 mg total) by mouth daily.  90 capsule  3  . ranitidine (ZANTAC) 300 MG tablet Take 1 tablet (300 mg total) by mouth at bedtime.  90 tablet  3      Review of Systems Patient Active Problem List  Diagnosis  . HYPOTHYROIDISM  . DIABETES MELLITUS, TYPE II  . UNSPECIFIED VITAMIN D DEFICIENCY  . HYPERLIPIDEMIA  . ANEMIA-IRON DEFICIENCY  . ANXIETY  . PANIC ATTACK  . DEPRESSION  . KERATOCONJUNCTIVITIS SICCA  . MITRAL VALVE PROLAPSE  . ABNORMAL HEART RHYTHMS  . RAYNAUD'S SYNDROME  . ESOPHAGITIS  . GERD  . GASTRITIS  . IBS  . ROSACEA  . OSTEOARTHRITIS  . BACK PAIN  . PLANTAR FASCIITIS  . FIBROMYALGIA  . OSTEOPENIA  . SOMATIC DYSFUNCTION  . PALPITATIONS  . MIGRAINES, HX OF  . PERSONAL HISTORY ALLERGY UNSPEC MEDICINAL AGENT  . Routine general medical examination at a health care facility  . Hyperkalemia  . Drug rash  . HTN (hypertension)  . Raynaud disease  . Obesity  . Other screening mammogram  . Lumbar disc disease with radiculopathy  . Degenerative disk disease  . Pedal edema  . Acute bacterial sinusitis  . Interstitial lung disease  . Elevated liver enzymes  . Rhinitis  . Steroid long-term use  . Post-menopausal   Past Medical History  Diagnosis Date  . GERD (gastroesophageal reflux disease)   . HLD (hyperlipidemia)   . HTN (hypertension)   . Hypothyroidism    . Osteoarthritis     hands  . Anemia, iron deficiency   . Diabetes mellitus type II     Diet controlled  . Edema   . Raynaud's disease   . Fibromyalgia   . Dry eyes   . Recurrent HSV (herpes simplex virus)     lesions in nose  . HNP (herniated nucleus pulposus) 2/99    T6,7,8 with DJD  . DJD (degenerative joint disease)   . Cervical dysplasia     abnormal paps  . Left ovarian cyst     x 3, rupture  . Osteopenia     mild-11/01; improved 12/05  . Diverticulosis   . Hemorrhoids     external  . Interstitial lung disease     ?   Past Surgical History  Procedure Date  . Hand surgery     left thumb  . Colonoscopy 11/01    Diverticulosis; hemorrhoids  . Lasik     bilateral   History  Substance Use Topics  . Smoking status: Former  Smoker    Types: Cigarettes  . Smokeless tobacco: Never Used   Comment: quit over 40 years  . Alcohol Use: No   Family History  Problem Relation Age of Onset  . Emphysema Father     + smoker  . Lung cancer Mother     + smoker  . Coronary artery disease Mother     relatively young  . Lymphoma Brother   . Lymphoma Sister   . Diabetes Brother   . Diabetes Sister   . Heart disease Brother   . Anemia Brother     aplactic    Allergies  Allergen Reactions  . Keflex (Cephalexin) Anaphylaxis  . Penicillins Anaphylaxis  . Amitriptyline Hcl     REACTION: sedating  . Atorvastatin     REACTION: muscle  . Benicar (Olmesartan Medoxomil)     Muscle pain   . Ceftin (Cefuroxime)     Swelling, "legs turn blue"  . Cetirizine Hcl     REACTION: headache  . Ciprofloxacin     REACTION: ? rash vs sun rxn  . Clonidine Derivatives     Swelling   . Diltiazem Hcl     REACTION: reaction not known  . Diovan (Valsartan)     Thought it made her feel confused  . Erythromycin     Rash, swollen gums   . Etodolac     REACTION: reaction not known  . Fluoxetine Hcl     REACTION: stomach problems  . Furosemide     REACTION: swelling  . Gabapentin      REACTION: edema  . Naproxen Sodium     REACTION: edema  . Paroxetine     REACTION: weight gain  . Pregabalin     REACTION: swelling  . Sulfonamide Derivatives     Rash, swollen gums, lips  . Tetracycline     REACTION:inflammed genitals  . Torsemide     REACTION: swelling  . Venlafaxine     REACTION: sweating  . Cephalexin Hives and Rash   Current Outpatient Prescriptions on File Prior to Visit  Medication Sig Dispense Refill  . albuterol (PROVENTIL HFA;VENTOLIN HFA) 108 (90 BASE) MCG/ACT inhaler Inhale 2 puffs into the lungs 3 (three) times daily as needed.       . budesonide-formoterol (SYMBICORT) 80-4.5 MCG/ACT inhaler Inhale 2 puffs into the lungs as needed.       Marland Kitchen buPROPion (WELLBUTRIN XL) 150 MG 24 hr tablet Take 1 tablet (150 mg total) by mouth daily.  90 tablet  0  . Chlorphen-Phenyleph-ASA (ALKA-SELTZER PLUS COLD PO) Take by mouth as needed.        . cyclobenzaprine (FLEXERIL) 10 MG tablet Take 10 mg by mouth at bedtime.       . cyclobenzaprine (FLEXERIL) 10 MG tablet TAKE ONE-HALF TO ONE TABLET BY MOUTH DAILY AND ONE TABLET NIGHTLY ATBEDTIME  180 tablet  1  . dexlansoprazole (DEXILANT) 60 MG capsule Take 1 capsule (60 mg total) by mouth daily.  90 capsule  3  . DIGESTIVE ENZYMES PO Take by mouth daily.       . diphenhydrAMINE (BENADRYL) 25 mg capsule Take 25 mg by mouth as needed.        . DULoxetine (CYMBALTA) 60 MG capsule Take 1 capsule (60 mg total) by mouth daily.  90 capsule  3  . fexofenadine (ALLEGRA) 180 MG tablet Take 180 mg by mouth daily.       Marland Kitchen glucose blood test strip One Touch Ultra stripts blue-To  check sugar once daily and as needed for DM2 250.00  100 each  3  . GUAIFENESIN CR PO Take by mouth as needed.        . hyoscyamine (LEVSIN SL) 0.125 MG SL tablet Take 1 tablet (0.125 mg total) by mouth every 4 (four) hours as needed for cramping.  270 tablet  0  . KRILL OIL PO Take by mouth daily.      Marland Kitchen levofloxacin (LEVAQUIN) 500 MG tablet Take 1 tablet  (500 mg total) by mouth daily.  7 tablet  0  . levothyroxine (SYNTHROID, LEVOTHROID) 150 MCG tablet Take 1 tablet (150 mcg total) by mouth daily.  30 tablet  3  . Magnesium 400 MG CAPS Take by mouth. Take 4 by mouth daily      . MAXALT-MLT 10 MG disintegrating tablet TAKE 1 TABLET AS NEEDED AS DIRECTED FOR MIGRAINE  27 tablet  3  . metFORMIN (GLUCOPHAGE) 500 MG tablet Take 1 tablet (500 mg total) by mouth 2 (two) times daily with a meal.  180 tablet  3  . metoprolol (TOPROL-XL) 50 MG 24 hr tablet Take 1 tablet (50 mg total) by mouth daily.  90 tablet  4  . mometasone (NASONEX) 50 MCG/ACT nasal spray Place 2 sprays into the nose daily.  51 g  3  . oxyCODONE (OXYCONTIN) 10 MG 12 hr tablet Take 10 mg by mouth 2 (two) times daily.       Marland Kitchen oxymorphone (OPANA ER) 20 MG 12 hr tablet Take 20 mg by mouth every 12 (twelve) hours.      . polyethylene glycol (MIRALAX / GLYCOLAX) packet Take 17 g by mouth as needed.       . ramipril (ALTACE) 5 MG capsule Take 1 capsule (5 mg total) by mouth daily.  90 capsule  3  . ranitidine (ZANTAC) 300 MG tablet Take 1 tablet (300 mg total) by mouth at bedtime.  90 tablet  3      Review of Systems  See HPI    Objective:   Physical Exam  BP 130/80  Pulse 100  Temp 99.1 F (37.3 C)  Wt 213 lb (96.616 kg)  Constitutional: She appears well-developed and well-nourished. No distress.       Obese and well appearing  HENT:  Head: Normocephalic and atraumatic.  Right Ear: External ear normal.  Left Ear: External ear normal.  Mouth/Throat: Oropharynx is clear and moist. No oropharyngeal exudate.   Nares are injected and congested  Clear post nasal drip    Eyes: Conjunctivae normal and EOM are normal. Pupils are equal, round, and reactive to light. Right eye exhibits no discharge. Left eye exhibits no discharge. No scleral icterus.  Neck: No JVD present. No thyromegaly present.  Cardiovascular: Normal rate, regular rhythm, normal heart sounds and intact distal  pulses.  Exam reveals no gallop.   Pulmonary/Chest: Effort normal and breath sounds normal. No respiratory distress. She has no wheezes. She has no rales.  Lymphadenopathy:    She has no cervical adenopathy.  Neurological: She is alert. She has normal reflexes. She exhibits normal muscle tone.  Skin: Skin is warm and dry. No rash noted.  Psychiatric: She has a normal mood and affect.        Assessment & Plan:  1.  Cough- Lungs unremarkable on exam but given history, will treat with abx for atypical pna. She has had temp at home (subjective). Given tussionex as needed for cough- discussed sedation precautions. Call or  return to clinic prn if these symptoms worsen or fail to improve as anticipated. The patient indicates understanding of these issues and agrees with the plan.

## 2012-08-06 ENCOUNTER — Telehealth: Payer: Self-pay | Admitting: Family Medicine

## 2012-08-06 NOTE — Telephone Encounter (Signed)
Please call pt re: refils and refil the long term [facility] and metoprolol to local pharmacy (all for a year and metoprolol for 2 refils) thanks

## 2012-08-07 MED ORDER — DEXLANSOPRAZOLE 60 MG PO CPDR: 60.0000 mg | DELAYED_RELEASE_CAPSULE | Freq: Every day | ORAL | Status: DC

## 2012-08-07 MED ORDER — BUPROPION HCL ER (XL) 150 MG PO TB24: 150.0000 mg | ORAL_TABLET | Freq: Every day | ORAL | Status: DC

## 2012-08-07 MED ORDER — METOPROLOL SUCCINATE ER 50 MG PO TB24: 50.0000 mg | ORAL_TABLET | Freq: Every day | ORAL | Status: DC

## 2012-08-07 NOTE — Telephone Encounter (Signed)
Notified pt of results and how much Ca and vit. D she should take per day

## 2012-08-07 NOTE — Telephone Encounter (Signed)
She has mild bone loss (osteopenia) -not full blown osteoporosis Want to make sure she is getting ca and vitamin D if she can  Try to get 1200-1500 mg of calcium per day with at least 1000 iu of vitamin D - for bone health  Will re check this in about 2 years

## 2012-08-07 NOTE — Telephone Encounter (Signed)
Refilled Rx that pt needed [facility] but pt wanted me to ask you what was her results of her DEXA scan, MyChart was hard for her to understand, pt wanted to know what was the outcome and should she be taking anything right now (meds, vitamins), please advise

## 2012-08-08 ENCOUNTER — Ambulatory Visit (INDEPENDENT_AMBULATORY_CARE_PROVIDER_SITE_OTHER): Payer: Managed Care, Other (non HMO) | Admitting: Family Medicine

## 2012-08-08 ENCOUNTER — Encounter: Payer: Self-pay | Admitting: Family Medicine

## 2012-08-08 VITALS — BP 130/84 | HR 104 | Temp 98.2°F | Wt 210.0 lb

## 2012-08-08 DIAGNOSIS — R059 Cough, unspecified: Secondary | ICD-10-CM

## 2012-08-08 DIAGNOSIS — R05 Cough: Secondary | ICD-10-CM

## 2012-08-08 DIAGNOSIS — B9689 Other specified bacterial agents as the cause of diseases classified elsewhere: Secondary | ICD-10-CM

## 2012-08-08 HISTORY — DX: Cough, unspecified: R05.9

## 2012-08-08 MED ORDER — LEVOFLOXACIN 500 MG PO TABS: 500.0000 mg | ORAL_TABLET | Freq: Every day | ORAL | Status: DC

## 2012-08-08 NOTE — Progress Notes (Signed)
Subjective:    Patient ID: Tina Berger, female    DOB: 1951/09/27, 61 y.o.   MRN: [id number]  61 yo pt of Dr. Milinda Antis with complicated history, including multiple drug allergies and hospital admission this summer for PNA/?interstitial lung disease here for persistent cough.  I saw her four days ago for cough, fever- started her on Avelox (multiple drug allergies).  She is still taking this. Also given tussionex for cough.  Fever has resolved, she feels cough is "deeper."  Treated for sinusitis by Dr. Milinda Antis last month.  No SOB or CP. No n/v/d.  Patient Active Problem List  Diagnosis  . HYPOTHYROIDISM  . DIABETES MELLITUS, TYPE II  . UNSPECIFIED VITAMIN D DEFICIENCY  . HYPERLIPIDEMIA  . ANEMIA-IRON DEFICIENCY  . ANXIETY  . PANIC ATTACK  . DEPRESSION  . KERATOCONJUNCTIVITIS SICCA  . MITRAL VALVE PROLAPSE  . ABNORMAL HEART RHYTHMS  . RAYNAUD'S SYNDROME  . ESOPHAGITIS  . GERD  . GASTRITIS  . IBS  . ROSACEA  . OSTEOARTHRITIS  . BACK PAIN  . PLANTAR FASCIITIS  . FIBROMYALGIA  . OSTEOPENIA  . SOMATIC DYSFUNCTION  . PALPITATIONS  . MIGRAINES, HX OF  . PERSONAL HISTORY ALLERGY UNSPEC MEDICINAL AGENT  . Routine general medical examination at a health care facility  . Hyperkalemia  . Drug rash  . HTN (hypertension)  . Raynaud disease  . Obesity  . Other screening mammogram  . Lumbar disc disease with radiculopathy  . Degenerative disk disease  . Pedal edema  . Acute bacterial sinusitis  . Interstitial lung disease  . Elevated liver enzymes  . Rhinitis  . Steroid long-term use  . Post-menopausal  . Cough   Past Medical History  Diagnosis Date  . GERD (gastroesophageal reflux disease)   . HLD (hyperlipidemia)   . HTN (hypertension)   . Hypothyroidism   . Osteoarthritis     hands  . Anemia, iron deficiency   . Diabetes mellitus type II     Diet controlled  . Edema   . Raynaud's disease   . Fibromyalgia   . Dry eyes   . Recurrent HSV (herpes simplex  virus)     lesions in nose  . HNP (herniated nucleus pulposus) 2/99    T6,7,8 with DJD  . DJD (degenerative joint disease)   . Cervical dysplasia     abnormal paps  . Left ovarian cyst     x 3, rupture  . Osteopenia     mild-11/01; improved 12/05  . Diverticulosis   . Hemorrhoids     external  . Interstitial lung disease     ?   Past Surgical History  Procedure Date  . Hand surgery     left thumb  . Colonoscopy 11/01    Diverticulosis; hemorrhoids  . Lasik     bilateral   History  Substance Use Topics  . Smoking status: Former Smoker    Types: Cigarettes  . Smokeless tobacco: Never Used   Comment: quit over 40 years  . Alcohol Use: No   Family History  Problem Relation Age of Onset  . Emphysema Father     + smoker  . Lung cancer Mother     + smoker  . Coronary artery disease Mother     relatively young  . Lymphoma Brother   . Lymphoma Sister   . Diabetes Brother   . Diabetes Sister   . Heart disease Brother   . Anemia Brother     aplactic  Allergies  Allergen Reactions  . Keflex (Cephalexin) Anaphylaxis  . Penicillins Anaphylaxis  . Amitriptyline Hcl     REACTION: sedating  . Atorvastatin     REACTION: muscle  . Benicar (Olmesartan Medoxomil)     Muscle pain   . Ceftin (Cefuroxime)     Swelling, "legs turn blue"  . Cetirizine Hcl     REACTION: headache  . Ciprofloxacin     REACTION: ? rash vs sun rxn  . Clonidine Derivatives     Swelling   . Diltiazem Hcl     REACTION: reaction not known  . Diovan (Valsartan)     Thought it made her feel confused  . Erythromycin     Rash, swollen gums   . Etodolac     REACTION: reaction not known  . Fluoxetine Hcl     REACTION: stomach problems  . Furosemide     REACTION: swelling  . Gabapentin     REACTION: edema  . Naproxen Sodium     REACTION: edema  . Paroxetine     REACTION: weight gain  . Pregabalin     REACTION: swelling  . Sulfonamide Derivatives     Rash, swollen gums, lips  .  Tetracycline     REACTION:inflammed genitals  . Torsemide     REACTION: swelling  . Venlafaxine     REACTION: sweating  . Cephalexin Hives and Rash   Current Outpatient Prescriptions on File Prior to Visit  Medication Sig Dispense Refill  . albuterol (PROVENTIL HFA;VENTOLIN HFA) 108 (90 BASE) MCG/ACT inhaler Inhale 2 puffs into the lungs 3 (three) times daily as needed.       . budesonide-formoterol (SYMBICORT) 80-4.5 MCG/ACT inhaler Inhale 2 puffs into the lungs as needed.       Marland Kitchen buPROPion (WELLBUTRIN XL) 150 MG 24 hr tablet Take 1 tablet (150 mg total) by mouth daily.  90 tablet  3  . Chlorphen-Phenyleph-ASA (ALKA-SELTZER PLUS COLD PO) Take by mouth as needed.        . chlorpheniramine-HYDROcodone (TUSSIONEX PENNKINETIC ER) 10-8 MG/5ML LQCR Take 5 mLs by mouth at bedtime as needed.  140 mL  0  . cyclobenzaprine (FLEXERIL) 10 MG tablet Take 10 mg by mouth at bedtime.       . cyclobenzaprine (FLEXERIL) 10 MG tablet TAKE ONE-HALF TO ONE TABLET BY MOUTH DAILY AND ONE TABLET NIGHTLY ATBEDTIME  180 tablet  1  . dexlansoprazole (DEXILANT) 60 MG capsule Take 1 capsule (60 mg total) by mouth daily.  90 capsule  3  . DIGESTIVE ENZYMES PO Take by mouth daily.       . diphenhydrAMINE (BENADRYL) 25 mg capsule Take 25 mg by mouth as needed.        . DULoxetine (CYMBALTA) 60 MG capsule Take 1 capsule (60 mg total) by mouth daily.  90 capsule  3  . fexofenadine (ALLEGRA) 180 MG tablet Take 180 mg by mouth daily.       Marland Kitchen glucose blood test strip One Touch Ultra stripts blue-To check sugar once daily and as needed for DM2 250.00  100 each  3  . GUAIFENESIN CR PO Take by mouth as needed.        . hyoscyamine (LEVSIN SL) 0.125 MG SL tablet Take 1 tablet (0.125 mg total) by mouth every 4 (four) hours as needed for cramping.  270 tablet  0  . KRILL OIL PO Take by mouth daily.      Marland Kitchen levofloxacin (LEVAQUIN) 500 MG tablet Take  1 tablet (500 mg total) by mouth daily.  7 tablet  0  . levothyroxine (SYNTHROID,  LEVOTHROID) 150 MCG tablet Take 1 tablet (150 mcg total) by mouth daily.  30 tablet  3  . Magnesium 400 MG CAPS Take by mouth. Take 4 by mouth daily      . MAXALT-MLT 10 MG disintegrating tablet TAKE 1 TABLET AS NEEDED AS DIRECTED FOR MIGRAINE  27 tablet  3  . metFORMIN (GLUCOPHAGE) 500 MG tablet Take 1 tablet (500 mg total) by mouth 2 (two) times daily with a meal.  180 tablet  3  . metoprolol succinate (TOPROL-XL) 50 MG 24 hr tablet Take 1 tablet (50 mg total) by mouth daily.  90 tablet  0  . mometasone (NASONEX) 50 MCG/ACT nasal spray Place 2 sprays into the nose daily.  51 g  3  . oxyCODONE (OXYCONTIN) 10 MG 12 hr tablet Take 10 mg by mouth 2 (two) times daily.       Marland Kitchen oxymorphone (OPANA ER) 20 MG 12 hr tablet Take 20 mg by mouth every 12 (twelve) hours.      . polyethylene glycol (MIRALAX / GLYCOLAX) packet Take 17 g by mouth as needed.       . ramipril (ALTACE) 5 MG capsule Take 1 capsule (5 mg total) by mouth daily.  90 capsule  3  . ranitidine (ZANTAC) 300 MG tablet Take 1 tablet (300 mg total) by mouth at bedtime.  90 tablet  3      Review of Systems Patient Active Problem List  Diagnosis  . HYPOTHYROIDISM  . DIABETES MELLITUS, TYPE II  . UNSPECIFIED VITAMIN D DEFICIENCY  . HYPERLIPIDEMIA  . ANEMIA-IRON DEFICIENCY  . ANXIETY  . PANIC ATTACK  . DEPRESSION  . KERATOCONJUNCTIVITIS SICCA  . MITRAL VALVE PROLAPSE  . ABNORMAL HEART RHYTHMS  . RAYNAUD'S SYNDROME  . ESOPHAGITIS  . GERD  . GASTRITIS  . IBS  . ROSACEA  . OSTEOARTHRITIS  . BACK PAIN  . PLANTAR FASCIITIS  . FIBROMYALGIA  . OSTEOPENIA  . SOMATIC DYSFUNCTION  . PALPITATIONS  . MIGRAINES, HX OF  . PERSONAL HISTORY ALLERGY UNSPEC MEDICINAL AGENT  . Routine general medical examination at a health care facility  . Hyperkalemia  . Drug rash  . HTN (hypertension)  . Raynaud disease  . Obesity  . Other screening mammogram  . Lumbar disc disease with radiculopathy  . Degenerative disk disease  . Pedal edema  .  Acute bacterial sinusitis  . Interstitial lung disease  . Elevated liver enzymes  . Rhinitis  . Steroid long-term use  . Post-menopausal  . Cough   Past Medical History  Diagnosis Date  . GERD (gastroesophageal reflux disease)   . HLD (hyperlipidemia)   . HTN (hypertension)   . Hypothyroidism   . Osteoarthritis     hands  . Anemia, iron deficiency   . Diabetes mellitus type II     Diet controlled  . Edema   . Raynaud's disease   . Fibromyalgia   . Dry eyes   . Recurrent HSV (herpes simplex virus)     lesions in nose  . HNP (herniated nucleus pulposus) 2/99    T6,7,8 with DJD  . DJD (degenerative joint disease)   . Cervical dysplasia     abnormal paps  . Left ovarian cyst     x 3, rupture  . Osteopenia     mild-11/01; improved 12/05  . Diverticulosis   . Hemorrhoids  external  . Interstitial lung disease     ?   Past Surgical History  Procedure Date  . Hand surgery     left thumb  . Colonoscopy 11/01    Diverticulosis; hemorrhoids  . Lasik     bilateral   History  Substance Use Topics  . Smoking status: Former Smoker    Types: Cigarettes  . Smokeless tobacco: Never Used   Comment: quit over 40 years  . Alcohol Use: No   Family History  Problem Relation Age of Onset  . Emphysema Father     + smoker  . Lung cancer Mother     + smoker  . Coronary artery disease Mother     relatively young  . Lymphoma Brother   . Lymphoma Sister   . Diabetes Brother   . Diabetes Sister   . Heart disease Brother   . Anemia Brother     aplactic    Allergies  Allergen Reactions  . Keflex (Cephalexin) Anaphylaxis  . Penicillins Anaphylaxis  . Amitriptyline Hcl     REACTION: sedating  . Atorvastatin     REACTION: muscle  . Benicar (Olmesartan Medoxomil)     Muscle pain   . Ceftin (Cefuroxime)     Swelling, "legs turn blue"  . Cetirizine Hcl     REACTION: headache  . Ciprofloxacin     REACTION: ? rash vs sun rxn  . Clonidine Derivatives     Swelling     . Diltiazem Hcl     REACTION: reaction not known  . Diovan (Valsartan)     Thought it made her feel confused  . Erythromycin     Rash, swollen gums   . Etodolac     REACTION: reaction not known  . Fluoxetine Hcl     REACTION: stomach problems  . Furosemide     REACTION: swelling  . Gabapentin     REACTION: edema  . Naproxen Sodium     REACTION: edema  . Paroxetine     REACTION: weight gain  . Pregabalin     REACTION: swelling  . Sulfonamide Derivatives     Rash, swollen gums, lips  . Tetracycline     REACTION:inflammed genitals  . Torsemide     REACTION: swelling  . Venlafaxine     REACTION: sweating  . Cephalexin Hives and Rash   Current Outpatient Prescriptions on File Prior to Visit  Medication Sig Dispense Refill  . albuterol (PROVENTIL HFA;VENTOLIN HFA) 108 (90 BASE) MCG/ACT inhaler Inhale 2 puffs into the lungs 3 (three) times daily as needed.       . budesonide-formoterol (SYMBICORT) 80-4.5 MCG/ACT inhaler Inhale 2 puffs into the lungs as needed.       Marland Kitchen buPROPion (WELLBUTRIN XL) 150 MG 24 hr tablet Take 1 tablet (150 mg total) by mouth daily.  90 tablet  3  . Chlorphen-Phenyleph-ASA (ALKA-SELTZER PLUS COLD PO) Take by mouth as needed.        . chlorpheniramine-HYDROcodone (TUSSIONEX PENNKINETIC ER) 10-8 MG/5ML LQCR Take 5 mLs by mouth at bedtime as needed.  140 mL  0  . cyclobenzaprine (FLEXERIL) 10 MG tablet Take 10 mg by mouth at bedtime.       . cyclobenzaprine (FLEXERIL) 10 MG tablet TAKE ONE-HALF TO ONE TABLET BY MOUTH DAILY AND ONE TABLET NIGHTLY ATBEDTIME  180 tablet  1  . dexlansoprazole (DEXILANT) 60 MG capsule Take 1 capsule (60 mg total) by mouth daily.  90 capsule  3  .  DIGESTIVE ENZYMES PO Take by mouth daily.       . diphenhydrAMINE (BENADRYL) 25 mg capsule Take 25 mg by mouth as needed.        . DULoxetine (CYMBALTA) 60 MG capsule Take 1 capsule (60 mg total) by mouth daily.  90 capsule  3  . fexofenadine (ALLEGRA) 180 MG tablet Take 180 mg by  mouth daily.       Marland Kitchen glucose blood test strip One Touch Ultra stripts blue-To check sugar once daily and as needed for DM2 250.00  100 each  3  . GUAIFENESIN CR PO Take by mouth as needed.        . hyoscyamine (LEVSIN SL) 0.125 MG SL tablet Take 1 tablet (0.125 mg total) by mouth every 4 (four) hours as needed for cramping.  270 tablet  0  . KRILL OIL PO Take by mouth daily.      Marland Kitchen levofloxacin (LEVAQUIN) 500 MG tablet Take 1 tablet (500 mg total) by mouth daily.  7 tablet  0  . levothyroxine (SYNTHROID, LEVOTHROID) 150 MCG tablet Take 1 tablet (150 mcg total) by mouth daily.  30 tablet  3  . Magnesium 400 MG CAPS Take by mouth. Take 4 by mouth daily      . MAXALT-MLT 10 MG disintegrating tablet TAKE 1 TABLET AS NEEDED AS DIRECTED FOR MIGRAINE  27 tablet  3  . metFORMIN (GLUCOPHAGE) 500 MG tablet Take 1 tablet (500 mg total) by mouth 2 (two) times daily with a meal.  180 tablet  3  . metoprolol succinate (TOPROL-XL) 50 MG 24 hr tablet Take 1 tablet (50 mg total) by mouth daily.  90 tablet  0  . mometasone (NASONEX) 50 MCG/ACT nasal spray Place 2 sprays into the nose daily.  51 g  3  . oxyCODONE (OXYCONTIN) 10 MG 12 hr tablet Take 10 mg by mouth 2 (two) times daily.       Marland Kitchen oxymorphone (OPANA ER) 20 MG 12 hr tablet Take 20 mg by mouth every 12 (twelve) hours.      . polyethylene glycol (MIRALAX / GLYCOLAX) packet Take 17 g by mouth as needed.       . ramipril (ALTACE) 5 MG capsule Take 1 capsule (5 mg total) by mouth daily.  90 capsule  3  . ranitidine (ZANTAC) 300 MG tablet Take 1 tablet (300 mg total) by mouth at bedtime.  90 tablet  3      Review of Systems  See HPI    Objective:   Physical Exam  BP 130/84  Pulse 104  Temp 98.2 F (36.8 C)  Wt 210 lb (95.255 kg)  Constitutional: She appears well-developed and well-nourished. No distress.       Obese and well appearing  HENT:  Head: Normocephalic and atraumatic.  Right Ear: External ear normal.  Left Ear: External ear normal.   Mouth/Throat: Oropharynx is clear and moist. No oropharyngeal exudate.   Nares are injected and congested  Clear post nasal drip    Eyes: Conjunctivae normal and EOM are normal. Pupils are equal, round, and reactive to light. Right eye exhibits no discharge. Left eye exhibits no discharge. No scleral icterus.  Neck: No JVD present. No thyromegaly present.  Cardiovascular: Normal rate, regular rhythm, normal heart sounds and intact distal pulses.  Exam reveals no gallop.   Pulmonary/Chest: Effort normal and breath sounds normal. No respiratory distress. She has no wheezes. She has no rales.  Lymphadenopathy:    She  has no cervical adenopathy.  Neurological: She is alert. She has normal reflexes. She exhibits normal muscle tone.  Skin: Skin is warm and dry. No rash noted.  Psychiatric: She has a normal mood and affect.        Assessment & Plan:  1.  Cough- Lungs unremarkable on exam again today. Advised adding an expectorant - mucinex to help break up the congestion. Discussed red flag symptoms. Call or return to clinic prn if these symptoms worsen or fail to improve as anticipated. The patient indicates understanding of these issues and agrees with the plan.

## 2012-08-08 NOTE — Patient Instructions (Addendum)
Please finish course of levaquin. Add mucinex. Continue using Tussionex at night.

## 2012-08-21 ENCOUNTER — Ambulatory Visit: Payer: Managed Care, Other (non HMO) | Admitting: Family Medicine

## 2012-09-04 ENCOUNTER — Ambulatory Visit (INDEPENDENT_AMBULATORY_CARE_PROVIDER_SITE_OTHER): Payer: Managed Care, Other (non HMO) | Admitting: Cardiovascular Disease

## 2012-09-04 ENCOUNTER — Encounter: Payer: Self-pay | Admitting: Family Medicine

## 2012-09-04 ENCOUNTER — Encounter: Payer: Self-pay | Admitting: Cardiovascular Disease

## 2012-09-04 VITALS — BP 140/98 | HR 96 | Ht 62.5 in | Wt 206.5 lb

## 2012-09-04 DIAGNOSIS — E669 Obesity, unspecified: Secondary | ICD-10-CM

## 2012-09-04 DIAGNOSIS — R600 Localized edema: Secondary | ICD-10-CM

## 2012-09-04 DIAGNOSIS — R002 Palpitations: Secondary | ICD-10-CM

## 2012-09-04 DIAGNOSIS — I10 Essential (primary) hypertension: Secondary | ICD-10-CM

## 2012-09-04 DIAGNOSIS — R609 Edema, unspecified: Secondary | ICD-10-CM

## 2012-09-04 MED ORDER — POTASSIUM CHLORIDE ER 10 MEQ PO TBCR: 10.0000 meq | EXTENDED_RELEASE_TABLET | Freq: Two times a day (BID) | ORAL | Status: DC

## 2012-09-04 MED ORDER — TORSEMIDE 20 MG PO TABS: 20.0000 mg | ORAL_TABLET | Freq: Every day | ORAL | Status: DC

## 2012-09-04 NOTE — Assessment & Plan Note (Signed)
She continues to have elevated heart rate. We did discuss possibly increasing her metoprolol dose. We will continue the same dose for now and have her watch her heart rate at home.

## 2012-09-04 NOTE — Assessment & Plan Note (Signed)
Blood pressure is well controlled on today's visit. No changes made to the medications. 

## 2012-09-04 NOTE — Progress Notes (Signed)
Patient ID: Tina Berger, female    DOB: September 24, 1951, 61 y.o.   MRN: [id number]  HPI Comments: Tina Berger is a 61 year old woman with a history of Raynaud's disease, hyperlipidemia, HTN, obesity, chronic lower extremity swelling who presents for Followup of her palpitations and hypertension, edema.  She reports that on the metoprolol, her palpitations have been well-controlled.   Her biggest complaint is lower extremity edema. She has noticed bilateral pitting edema. She has seen rheumatology and had significant workup with no new treatments. She reports having tried a diuretic in the past and unsure if this works for her. She does drink significant fluid during the daytime.  She has been taking metoprolol for palpitations. Continues to have fast heart rate on current dose of metoprolol. She does take short acting diltiazem as needed for worsening Raynaud's of the feet   Echocardiogram in October 2008 was essentially normal.   Stress test in October 2008 showed no ischemia. This is a exercise treadmill. She exercised for 4 minutes 30 seconds, peak heart rate of 152 beats per minute, peak blood pressure 172/94.   EKG shows normal sinus rhythm with a rate of 96 beats per minute, no significant ST or T wave changes.      Outpatient Encounter Prescriptions as of 09/04/2012  Medication Sig Dispense Refill  . albuterol (PROVENTIL HFA;VENTOLIN HFA) 108 (90 BASE) MCG/ACT inhaler Inhale 2 puffs into the lungs 3 (three) times daily as needed.       . budesonide-formoterol (SYMBICORT) 80-4.5 MCG/ACT inhaler Inhale 2 puffs into the lungs as needed.       Marland Kitchen buPROPion (WELLBUTRIN XL) 150 MG 24 hr tablet Take 1 tablet (150 mg total) by mouth daily.  90 tablet  3  . Chlorphen-Phenyleph-ASA (ALKA-SELTZER PLUS COLD PO) Take by mouth as needed.        . chlorpheniramine-HYDROcodone (TUSSIONEX PENNKINETIC ER) 10-8 MG/5ML LQCR Take 5 mLs by mouth at bedtime as needed.  140 mL  0  . cyclobenzaprine (FLEXERIL) 10  MG tablet TAKE ONE-HALF TO ONE TABLET BY MOUTH DAILY AND ONE TABLET NIGHTLY ATBEDTIME  180 tablet  1  . dexlansoprazole (DEXILANT) 60 MG capsule Take 1 capsule (60 mg total) by mouth daily.  90 capsule  3  . DIGESTIVE ENZYMES PO Take by mouth daily.       . diphenhydrAMINE (BENADRYL) 25 mg capsule Take 25 mg by mouth as needed.        . DULoxetine (CYMBALTA) 60 MG capsule Take 1 capsule (60 mg total) by mouth daily.  90 capsule  3  . fexofenadine (ALLEGRA) 180 MG tablet Take 180 mg by mouth daily.       Marland Kitchen glucose blood test strip One Touch Ultra stripts blue-To check sugar once daily and as needed for DM2 250.00  100 each  3  . GUAIFENESIN CR PO Take by mouth as needed.        . hyoscyamine (LEVSIN SL) 0.125 MG SL tablet Take 1 tablet (0.125 mg total) by mouth every 4 (four) hours as needed for cramping.  270 tablet  0  . KRILL OIL PO Take by mouth daily.      Marland Kitchen levofloxacin (LEVAQUIN) 500 MG tablet Take 500 mg by mouth as needed.      Marland Kitchen levothyroxine (SYNTHROID, LEVOTHROID) 150 MCG tablet Take 150 mcg by mouth daily.      . Magnesium 400 MG CAPS Take by mouth daily.       Marland Kitchen MAXALT-MLT 10  MG disintegrating tablet TAKE 1 TABLET AS NEEDED AS DIRECTED FOR MIGRAINE  27 tablet  3  . metFORMIN (GLUCOPHAGE) 500 MG tablet Take 1 tablet (500 mg total) by mouth 2 (two) times daily with a meal.  180 tablet  3  . metoprolol succinate (TOPROL-XL) 50 MG 24 hr tablet Take 1 tablet (50 mg total) by mouth daily.  90 tablet  0  . mometasone (NASONEX) 50 MCG/ACT nasal spray Place 2 sprays into the nose daily.  51 g  3  . oxyCODONE (OXYCONTIN) 10 MG 12 hr tablet Take 10 mg by mouth 2 (two) times daily.       Marland Kitchen oxymorphone (OPANA ER) 20 MG 12 hr tablet Take 20 mg by mouth every 12 (twelve) hours.      . polyethylene glycol (MIRALAX / GLYCOLAX) packet Take 17 g by mouth as needed.       . ramipril (ALTACE) 5 MG capsule Take 1 capsule (5 mg total) by mouth daily.  90 capsule  3  . ranitidine (ZANTAC) 300 MG tablet  Take 1 tablet (300 mg total) by mouth at bedtime.  90 tablet  3     Review of Systems  Constitutional: Negative.   HENT: Negative.   Eyes: Negative.   Respiratory: Negative.   Cardiovascular: Positive for palpitations and leg swelling.  Gastrointestinal: Negative.   Musculoskeletal: Negative.   Skin: Negative.   Neurological: Negative.   Hematological: Negative.   Psychiatric/Behavioral: Negative.   All other systems reviewed and are negative.    BP 140/98  Pulse 96  Ht 5' 2.5" (1.588 m)  Wt 206 lb 8 oz (93.668 kg)  BMI 37.17 kg/m2  Physical Exam  Nursing note and vitals reviewed. Constitutional: She is oriented to person, place, and time. She appears well-developed and well-nourished.  HENT:  Head: Normocephalic.  Nose: Nose normal.  Mouth/Throat: Oropharynx is clear and moist.  Eyes: Conjunctivae normal are normal. Pupils are equal, round, and reactive to light.  Neck: Normal range of motion. Neck supple. No JVD present.  Cardiovascular: Normal rate, regular rhythm, S1 normal, S2 normal, normal heart sounds and intact distal pulses.  Exam reveals no gallop and no friction rub.   No murmur heard.      1+ pitting edema to the mid shins bilaterally, redness of the toes  Pulmonary/Chest: Effort normal and breath sounds normal. No respiratory distress. She has no wheezes. She has no rales. She exhibits no tenderness.  Abdominal: Soft. Bowel sounds are normal. She exhibits no distension. There is no tenderness.  Musculoskeletal: Normal range of motion. She exhibits no edema and no tenderness.  Lymphadenopathy:    She has no cervical adenopathy.  Neurological: She is alert and oriented to person, place, and time. Coordination normal.  Skin: Skin is warm and dry. No rash noted. No erythema.  Psychiatric: She has a normal mood and affect. Her behavior is normal. Judgment and thought content normal.         Assessment and Plan

## 2012-09-04 NOTE — Assessment & Plan Note (Signed)
Significant edema bilaterally that is pitting. We have suggested she try torsemide 20 mg daily for several days been sporadically. Take with potassium. Unable to exclude diastolic CHF as a cause of her edema. She does not want to repeat an echocardiogram which would help estimate right ventricular systolic pressures.  We will experiment with diuresis first. We have asked her to decrease her fluid intake through the next week to see if this helps with a diuretic.   She may benefit from compression hose. If no improvement of her edema with diuresis, she may benefit from compression boots to use during the day.

## 2012-09-04 NOTE — Assessment & Plan Note (Signed)
We have encouraged continued exercise, careful diet management in an effort to lose weight. 

## 2012-09-04 NOTE — Patient Instructions (Addendum)
Please start torsemide/demedex one a day for a few days with potassium one a day for edema If you have several pound weigh loss from fluid loss, call the office   Please call us if you have new issues that need to be addressed before your next appt.  Your physician wants you to follow-up in: 1 month.

## 2012-09-05 ENCOUNTER — Other Ambulatory Visit (INDEPENDENT_AMBULATORY_CARE_PROVIDER_SITE_OTHER): Payer: Managed Care, Other (non HMO)

## 2012-09-05 DIAGNOSIS — E039 Hypothyroidism, unspecified: Secondary | ICD-10-CM

## 2012-10-05 ENCOUNTER — Encounter: Payer: Self-pay | Admitting: Cardiovascular Disease

## 2012-10-05 ENCOUNTER — Ambulatory Visit (INDEPENDENT_AMBULATORY_CARE_PROVIDER_SITE_OTHER): Payer: Managed Care, Other (non HMO) | Admitting: Cardiovascular Disease

## 2012-10-05 VITALS — BP 138/80 | HR 89 | Ht 62.0 in | Wt 201.0 lb

## 2012-10-05 DIAGNOSIS — E785 Hyperlipidemia, unspecified: Secondary | ICD-10-CM

## 2012-10-05 DIAGNOSIS — R600 Localized edema: Secondary | ICD-10-CM

## 2012-10-05 DIAGNOSIS — I059 Rheumatic mitral valve disease, unspecified: Secondary | ICD-10-CM

## 2012-10-05 DIAGNOSIS — I499 Cardiac arrhythmia, unspecified: Secondary | ICD-10-CM

## 2012-10-05 DIAGNOSIS — I10 Essential (primary) hypertension: Secondary | ICD-10-CM

## 2012-10-05 DIAGNOSIS — R609 Edema, unspecified: Secondary | ICD-10-CM

## 2012-10-05 MED ORDER — TORSEMIDE 20 MG PO TABS: 20.0000 mg | ORAL_TABLET | Freq: Two times a day (BID) | ORAL | Status: DC | PRN

## 2012-10-05 MED ORDER — METOPROLOL SUCCINATE ER 50 MG PO TB24: 50.0000 mg | ORAL_TABLET | Freq: Two times a day (BID) | ORAL | Status: DC

## 2012-10-05 NOTE — Assessment & Plan Note (Signed)
Palpitations have improved on metoprolol. Heart rate continues to be borderline elevated. We have suggested she could try the Toprol succinate 75 mg daily, up from 50 mg daily.

## 2012-10-05 NOTE — Assessment & Plan Note (Signed)
Edema improved on torsemide daily. We have suggested she continue this with close monitoring of her weight. Weight is now down 5 pounds. We have suggested she had basic metabolic panel done in the next few weeks, particularly for additional weight loss. Need to evaluate her potassium.

## 2012-10-05 NOTE — Patient Instructions (Addendum)
You are doing well. Consider starting extra 1/2 dose of metoprolol for total of 1 1/2 pills in the Am  Ok to take extra torsemide anytime as needed  Please call us if you have new issues that need to be addressed before your next appt.  Your physician wants you to follow-up in: 6 months.  You will receive a reminder letter in the mail two months in advance. If you don't receive a letter, please call our office to schedule the follow-up appointment.

## 2012-10-05 NOTE — Assessment & Plan Note (Signed)
We have talked with her about diet and exercise.

## 2012-10-05 NOTE — Assessment & Plan Note (Signed)
Suggestion to increase metoprolol up slightly. Blood pressures otherwise well controlled.

## 2012-10-05 NOTE — Progress Notes (Signed)
Patient ID: Tina Berger, female    DOB: 02-05-1951, 61 y.o.   MRN: [id number]  HPI Comments: [name] is a 61 year old woman with a history of Raynaud's disease, hyperlipidemia, HTN, obesity, chronic lower extremity swelling who presents for followup of her palpitations and hypertension, edema.  On her last clinic visit, edema was worse and in fact quite painful. She was started on torsemide 20 mg daily and she has had 5 pound weight loss, improvement of her edema. Still with mild edema around her ankles and feet. She continues to drink a significant amount of fluid during the daytime She reports that on the metoprolol, her palpitations have been well-controlled.    She has been taking metoprolol for palpitations, succinate 50 mg daily She does take short acting diltiazem as needed for worsening Raynaud's of the feet   Echocardiogram in October 2008 was essentially normal.   Stress test in October 2008 showed no ischemia. This is a exercise treadmill. She exercised for 4 minutes 30 seconds, peak heart rate of 152 beats per minute, peak blood pressure 172/94.   EKG shows normal sinus rhythm with a rate of 89 beats per minute, no significant ST or T wave changes.      Outpatient Encounter Prescriptions as of 10/05/2012  Medication Sig Dispense Refill  . albuterol (PROVENTIL HFA;VENTOLIN HFA) 108 (90 BASE) MCG/ACT inhaler Inhale 2 puffs into the lungs 3 (three) times daily as needed.       . budesonide-formoterol (SYMBICORT) 80-4.5 MCG/ACT inhaler Inhale 2 puffs into the lungs as needed.       Marland Kitchen buPROPion (WELLBUTRIN XL) 150 MG 24 hr tablet Take 1 tablet (150 mg total) by mouth daily.  90 tablet  3  . Chlorphen-Phenyleph-ASA (ALKA-SELTZER PLUS COLD PO) Take by mouth as needed.        . chlorpheniramine-HYDROcodone (TUSSIONEX PENNKINETIC ER) 10-8 MG/5ML LQCR Take 5 mLs by mouth at bedtime as needed.  140 mL  0  . cyclobenzaprine (FLEXERIL) 10 MG tablet TAKE ONE-HALF TO ONE TABLET BY MOUTH  DAILY AND ONE TABLET NIGHTLY ATBEDTIME  180 tablet  1  . dexlansoprazole (DEXILANT) 60 MG capsule Take 1 capsule (60 mg total) by mouth daily.  90 capsule  3  . DIGESTIVE ENZYMES PO Take by mouth daily.       . diphenhydrAMINE (BENADRYL) 25 mg capsule Take 25 mg by mouth as needed.        . DULoxetine (CYMBALTA) 60 MG capsule Take 1 capsule (60 mg total) by mouth daily.  90 capsule  3  . fexofenadine (ALLEGRA) 180 MG tablet Take 180 mg by mouth daily.       Marland Kitchen glucose blood test strip One Touch Ultra stripts blue-To check sugar once daily and as needed for DM2 250.00  100 each  3  . GUAIFENESIN CR PO Take by mouth as needed.        . hyoscyamine (LEVSIN SL) 0.125 MG SL tablet Take 1 tablet (0.125 mg total) by mouth every 4 (four) hours as needed for cramping.  270 tablet  0  . KRILL OIL PO Take by mouth daily.      Marland Kitchen levofloxacin (LEVAQUIN) 500 MG tablet Take 500 mg by mouth as needed.      Marland Kitchen levothyroxine (SYNTHROID, LEVOTHROID) 150 MCG tablet Take 150 mcg by mouth daily.      . Magnesium 400 MG CAPS Take by mouth daily.       Marland Kitchen MAXALT-MLT 10 MG disintegrating  tablet TAKE 1 TABLET AS NEEDED AS DIRECTED FOR MIGRAINE  27 tablet  3  . metFORMIN (GLUCOPHAGE) 500 MG tablet Take 1 tablet (500 mg total) by mouth 2 (two) times daily with a meal.  180 tablet  3  . metoprolol succinate (TOPROL-XL) 50 MG 24 hr tablet Take 1 tablet (50 mg total) by mouth  daily.  180 tablet  3  . mometasone (NASONEX) 50 MCG/ACT nasal spray Place 2 sprays into the nose daily.  51 g  3  . oxyCODONE (OXYCONTIN) 10 MG 12 hr tablet Take 10 mg by mouth 2 (two) times daily.       Marland Kitchen oxymorphone (OPANA ER) 20 MG 12 hr tablet Take 20 mg by mouth every 12 (twelve) hours.      . polyethylene glycol (MIRALAX / GLYCOLAX) packet Take 17 g by mouth as needed.       . potassium chloride (K-DUR) 10 MEQ tablet Take 1 tablet (10 mEq total) by mouth 2 (two) times daily.  60 tablet  6  . ramipril (ALTACE) 5 MG capsule Take 1 capsule (5 mg total)  by mouth daily.  90 capsule  3  . ranitidine (ZANTAC) 300 MG tablet Take 1 tablet (300 mg total) by mouth at bedtime.  90 tablet  3  . torsemide (DEMADEX) 20 MG tablet Take 1 tablet (20 mg total) by mouth  daily  180 tablet  3  . Vitamin D, Ergocalciferol, (DRISDOL) 50000 UNITS CAPS Take 50,000 Units by mouth Once a week.        Review of Systems  Constitutional: Negative.   HENT: Negative.   Eyes: Negative.   Respiratory: Negative.   Gastrointestinal: Negative.   Musculoskeletal: Negative.   Skin: Negative.   Neurological: Negative.   Hematological: Negative.   Psychiatric/Behavioral: Negative.   All other systems reviewed and are negative.    BP 138/80  Pulse 89  Ht 5\' 2"  (1.575 m)  Wt 201 lb (91.173 kg)  BMI 36.76 kg/m2  Physical Exam  Nursing note and vitals reviewed. Constitutional: She is oriented to person, place, and time. She appears well-developed and well-nourished.       Obese  HENT:  Head: Normocephalic.  Nose: Nose normal.  Mouth/Throat: Oropharynx is clear and moist.  Eyes: Conjunctivae normal are normal. Pupils are equal, round, and reactive to light.  Neck: Normal range of motion. Neck supple. No JVD present.  Cardiovascular: Normal rate, regular rhythm, S1 normal, S2 normal, normal heart sounds and intact distal pulses.  Exam reveals no gallop and no friction rub.   No murmur heard.      Trace pitting edema around the ankles and feet  Pulmonary/Chest: Effort normal and breath sounds normal. No respiratory distress. She has no wheezes. She has no rales. She exhibits no tenderness.  Abdominal: Soft. Bowel sounds are normal. She exhibits no distension. There is no tenderness.  Musculoskeletal: Normal range of motion. She exhibits no edema and no tenderness.  Lymphadenopathy:    She has no cervical adenopathy.  Neurological: She is alert and oriented to person, place, and time. Coordination normal.  Skin: Skin is warm and dry. No rash noted. No erythema.   Psychiatric: She has a normal mood and affect. Her behavior is normal. Judgment and thought content normal.         Assessment and Plan

## 2012-10-24 ENCOUNTER — Other Ambulatory Visit: Payer: Managed Care, Other (non HMO)

## 2012-10-26 ENCOUNTER — Ambulatory Visit (INDEPENDENT_AMBULATORY_CARE_PROVIDER_SITE_OTHER): Payer: Managed Care, Other (non HMO)

## 2012-10-26 DIAGNOSIS — R609 Edema, unspecified: Secondary | ICD-10-CM

## 2012-10-27 LAB — BASIC METABOLIC PANEL
BUN/Creatinine Ratio: 20 (ref 11–26)
BUN: 18 mg/dL (ref 8–27)
Chloride: 98 mmol/L (ref 97–108)
GFR calc Af Amer: 81 mL/min/{1.73_m2} (ref >59)
GFR calc non Af Amer: 71 mL/min/{1.73_m2} (ref >59)

## 2012-11-04 ENCOUNTER — Encounter: Payer: Self-pay | Admitting: Cardiovascular Disease

## 2012-11-14 ENCOUNTER — Other Ambulatory Visit: Payer: Self-pay | Admitting: Family Medicine

## 2012-11-20 ENCOUNTER — Other Ambulatory Visit: Payer: Self-pay | Admitting: Family Medicine

## 2012-11-28 ENCOUNTER — Other Ambulatory Visit: Payer: Self-pay | Admitting: Family Medicine

## 2012-12-02 ENCOUNTER — Other Ambulatory Visit: Payer: Self-pay

## 2013-01-11 ENCOUNTER — Other Ambulatory Visit: Payer: Self-pay | Admitting: Family Medicine

## 2013-01-11 MED ORDER — LEVOTHYROXINE SODIUM 150 MCG PO TABS: 150.0000 ug | ORAL_TABLET | Freq: Every day | ORAL | Status: DC

## 2013-02-17 ENCOUNTER — Other Ambulatory Visit: Payer: Self-pay | Admitting: Family Medicine

## 2013-02-19 NOTE — Telephone Encounter (Signed)
done

## 2013-02-19 NOTE — Telephone Encounter (Signed)
Ok to refill 

## 2013-02-19 NOTE — Telephone Encounter (Signed)
Please give her 180 with one refil

## 2013-02-27 ENCOUNTER — Encounter: Payer: Self-pay | Admitting: Family Medicine

## 2013-02-27 ENCOUNTER — Ambulatory Visit (INDEPENDENT_AMBULATORY_CARE_PROVIDER_SITE_OTHER): Payer: Managed Care, Other (non HMO) | Admitting: Family Medicine

## 2013-02-27 VITALS — BP 126/88 | HR 90 | Temp 99.2°F | Ht 62.0 in | Wt 192.2 lb

## 2013-02-27 DIAGNOSIS — B9689 Other specified bacterial agents as the cause of diseases classified elsewhere: Secondary | ICD-10-CM

## 2013-02-27 DIAGNOSIS — J019 Acute sinusitis, unspecified: Secondary | ICD-10-CM

## 2013-02-27 MED ORDER — LEVOFLOXACIN 500 MG PO TABS: 500.0000 mg | ORAL_TABLET | Freq: Every day | ORAL | Status: DC

## 2013-02-27 NOTE — Progress Notes (Signed)
Subjective:    Patient ID: Tina Berger, female    DOB: Mar 16, 1951, 62 y.o.   MRN: [id number]  HPI Here with 3 weeks of uri symptoms  Thinks she has sinusitis  Drainage and cough and congestion  Used inhaler for a while  Face hurts under eyes -predominantly on the R side- now both  Green discharge from nose   Temp 99.2 today  Stays hot all the time   Patient Active Problem List   Diagnosis Date Noted  . Cough 08/08/2012  . Steroid long-term use 06/23/2012  . Post-menopausal 06/23/2012  . Interstitial lung disease 03/21/2012  . Elevated liver enzymes 03/21/2012  . Rhinitis 03/21/2012  . Acute bacterial sinusitis 01/31/2012  . Pedal edema 01/28/2012  . Degenerative disk disease 11/19/2011  . Lumbar disc disease with radiculopathy 11/18/2011  . Other screening mammogram 08/18/2011  . HTN (hypertension) 07/30/2011  . Raynaud disease 07/30/2011  . Obesity 07/30/2011  . Drug rash 05/22/2011  . Hyperkalemia 04/07/2011  . Routine general medical examination at a health care facility 03/28/2011  . PALPITATIONS 04/27/2010  . PERSONAL HISTORY ALLERGY UNSPEC MEDICINAL AGENT 03/18/2010  . SOMATIC DYSFUNCTION 12/11/2009  . DEPRESSION 06/14/2008  . DIABETES MELLITUS, TYPE II 05/13/2008  . UNSPECIFIED VITAMIN D DEFICIENCY 05/13/2008  . ANXIETY 03/25/2008  . OSTEOPENIA 03/25/2008  . HYPOTHYROIDISM 03/28/2007  . HYPERLIPIDEMIA 03/28/2007  . ANEMIA-IRON DEFICIENCY 03/28/2007  . PANIC ATTACK 03/28/2007  . KERATOCONJUNCTIVITIS SICCA 03/28/2007  . MITRAL VALVE PROLAPSE 03/28/2007  . ABNORMAL HEART RHYTHMS 03/28/2007  . RAYNAUD'S SYNDROME 03/28/2007  . ESOPHAGITIS 03/28/2007  . GERD 03/28/2007  . GASTRITIS 03/28/2007  . IBS 03/28/2007  . ROSACEA 03/28/2007  . OSTEOARTHRITIS 03/28/2007  . BACK PAIN 03/28/2007  . PLANTAR FASCIITIS 03/28/2007  . FIBROMYALGIA 03/28/2007  . MIGRAINES, HX OF 03/28/2007   Past Medical History  Diagnosis Date  . GERD (gastroesophageal reflux  disease)   . HLD (hyperlipidemia)   . HTN (hypertension)   . Hypothyroidism   . Osteoarthritis     hands  . Anemia, iron deficiency   . Diabetes mellitus type II     Diet controlled  . Edema   . Raynaud's disease   . Fibromyalgia   . Dry eyes   . Recurrent HSV (herpes simplex virus)     lesions in nose  . HNP (herniated nucleus pulposus) 2/99    T6,7,8 with DJD  . DJD (degenerative joint disease)   . Cervical dysplasia     abnormal paps  . Left ovarian cyst     x 3, rupture  . Osteopenia     mild-11/01; improved 12/05  . Diverticulosis   . Hemorrhoids     external  . Interstitial lung disease     ?   Past Surgical History  Procedure Laterality Date  . Hand surgery      left thumb  . Colonoscopy  11/01    Diverticulosis; hemorrhoids  . Lasik      bilateral   History  Substance Use Topics  . Smoking status: Former Smoker    Types: Cigarettes  . Smokeless tobacco: Never Used     Comment: quit over 40 years  . Alcohol Use: No   Family History  Problem Relation Age of Onset  . Emphysema Father     + smoker  . Lung cancer Mother     + smoker  . Coronary artery disease Mother     relatively young  . Lymphoma Brother   . Lymphoma  Sister   . Diabetes Brother   . Diabetes Sister   . Heart disease Brother   . Anemia Brother     aplactic    Allergies  Allergen Reactions  . Keflex (Cephalexin) Anaphylaxis  . Penicillins Anaphylaxis  . Amitriptyline Hcl     REACTION: sedating  . Atorvastatin     REACTION: muscle  . Benicar (Olmesartan Medoxomil)     Muscle pain   . Ceftin (Cefuroxime)     Swelling, "legs turn blue"  . Cetirizine Hcl     REACTION: headache  . Ciprofloxacin     REACTION: ? rash vs sun rxn  . Clonidine Derivatives     Swelling   . Diltiazem Hcl     REACTION: reaction not known  . Diovan (Valsartan)     Thought it made her feel confused  . Erythromycin     Rash, swollen gums   . Etodolac     REACTION: reaction not known  .  Fluoxetine Hcl     REACTION: stomach problems  . Furosemide     REACTION: swelling  . Gabapentin     REACTION: edema  . Naproxen Sodium     REACTION: edema  . Paroxetine     REACTION: weight gain  . Pregabalin     REACTION: swelling  . Sulfonamide Derivatives     Rash, swollen gums, lips  . Tetracycline     REACTION:inflammed genitals  . Torsemide     REACTION: swelling  . Venlafaxine     REACTION: sweating  . Cephalexin Hives and Rash   Current Outpatient Prescriptions on File Prior to Visit  Medication Sig Dispense Refill  . budesonide-formoterol (SYMBICORT) 80-4.5 MCG/ACT inhaler Inhale 2 puffs into the lungs as needed.       Marland Kitchen buPROPion (WELLBUTRIN XL) 150 MG 24 hr tablet Take 1 tablet (150 mg total) by mouth daily.  90 tablet  3  . Chlorphen-Phenyleph-ASA (ALKA-SELTZER PLUS COLD PO) Take by mouth as needed.        . cyclobenzaprine (FLEXERIL) 10 MG tablet TAKE ONE-HALF TO ONE TABLET BY MOUTH ONCE DAILY AND TAKE ONE TABLET AT BEDTIME  180 tablet  1  . DIGESTIVE ENZYMES PO Take by mouth daily.       . diphenhydrAMINE (BENADRYL) 25 mg capsule Take 25 mg by mouth as needed.        . DULoxetine (CYMBALTA) 60 MG capsule TAKE 1 CAPSULE DAILY  90 capsule  0  . fexofenadine (ALLEGRA) 180 MG tablet Take 180 mg by mouth daily.       Marland Kitchen glucose blood test strip One Touch Ultra stripts blue-To check sugar once daily and as needed for DM2 250.00  100 each  3  . GUAIFENESIN CR PO Take by mouth as needed.        . hyoscyamine (LEVSIN SL) 0.125 MG SL tablet Take 1 tablet (0.125 mg total) by mouth every 4 (four) hours as needed for cramping.  270 tablet  0  . KRILL OIL PO Take by mouth daily.      Marland Kitchen levofloxacin (LEVAQUIN) 500 MG tablet Take 500 mg by mouth as needed.      Marland Kitchen levothyroxine (SYNTHROID, LEVOTHROID) 150 MCG tablet Take 1 tablet (150 mcg total) by mouth daily.  90 tablet  0  . Magnesium 400 MG CAPS Take by mouth daily.       . metFORMIN (GLUCOPHAGE) 500 MG tablet Take 1 tablet  (500 mg total) by mouth 2 (  two) times daily with a meal.  180 tablet  3  . metoprolol succinate (TOPROL-XL) 50 MG 24 hr tablet Take 1 tablet (50 mg total) by mouth 2 (two) times daily.  180 tablet  3  . mometasone (NASONEX) 50 MCG/ACT nasal spray Place 2 sprays into the nose daily.  51 g  3  . oxyCODONE (OXYCONTIN) 10 MG 12 hr tablet Take 10 mg by mouth 2 (two) times daily.       Marland Kitchen oxymorphone (OPANA ER) 20 MG 12 hr tablet Take 20 mg by mouth every 12 (twelve) hours.      . polyethylene glycol (MIRALAX / GLYCOLAX) packet Take 17 g by mouth as needed.       . ramipril (ALTACE) 5 MG capsule TAKE 1 CAPSULE DAILY  90 capsule  0  . ranitidine (ZANTAC) 300 MG tablet Take 1 tablet (300 mg total) by mouth at bedtime.  90 tablet  3  . torsemide (DEMADEX) 20 MG tablet Take 1 tablet (20 mg total) by mouth 2 (two) times daily as needed.  180 tablet  3   No current facility-administered medications on file prior to visit.    Review of Systems Review of Systems  Constitutional: Negative for  appetite change,  and unexpected weight change.  ENt pos for congestion and facial pain and ear pain  Eyes: Negative for pain and visual disturbance.  Respiratory: Negative for wheeze and shortness of breath.   Cardiovascular: Negative for cp or palpitations    Gastrointestinal: Negative for nausea, diarrhea and constipation.  Genitourinary: Negative for urgency and frequency.  Skin: Negative for pallor or rash   Neurological: Negative for weakness, light-headedness, numbness and headaches.  Hematological: Negative for adenopathy. Does not bruise/bleed easily.  Psychiatric/Behavioral: Negative for dysphoric mood. The patient is not nervous/anxious.         Objective:   Physical Exam  Constitutional: She appears well-developed and well-nourished. No distress.  obese and well appearing   HENT:  Head: Normocephalic and atraumatic.  Right Ear: External ear normal.  Left Ear: External ear normal.  Mouth/Throat:  Oropharynx is clear and moist. No oropharyngeal exudate.  Nares are injected and congested   Bilateral maxillary sinus tenderness  Clear post nasal drip   Eyes: Conjunctivae and EOM are normal. Pupils are equal, round, and reactive to light. Right eye exhibits no discharge. Left eye exhibits no discharge. No scleral icterus.  Neck: Normal range of motion. Neck supple.  Cardiovascular: Normal rate and regular rhythm.   Pulmonary/Chest: Effort normal and breath sounds normal. No respiratory distress. She has no wheezes. She has no rales.  Lymphadenopathy:    She has no cervical adenopathy.  Neurological: She is alert.  Skin: Skin is dry. No rash noted.  Psychiatric: She has a normal mood and affect.          Assessment & Plan:

## 2013-02-27 NOTE — Assessment & Plan Note (Signed)
With multiple drug intol /allergies  3 weeks of symptoms - brought on by all rhinitis Will tx with levaquin- which she seems to tolerate Disc symptomatic care - see instructions on AVS  Update if not starting to improve in a week or if worsening

## 2013-02-27 NOTE — Patient Instructions (Addendum)
Take the levaquin as directed for sinus infection Continue mucinex Nasal saline irrigation and warm compresses are helpful Update if not starting to improve in a week or if worsening

## 2013-03-09 ENCOUNTER — Other Ambulatory Visit: Payer: Self-pay | Admitting: Family Medicine

## 2013-03-09 ENCOUNTER — Encounter: Payer: Self-pay | Admitting: Family Medicine

## 2013-03-09 MED ORDER — METFORMIN HCL 500 MG PO TABS: 500.0000 mg | ORAL_TABLET | Freq: Two times a day (BID) | ORAL | Status: DC

## 2013-03-09 NOTE — Telephone Encounter (Signed)
Please refill for a year -that is ok

## 2013-03-09 NOTE — Telephone Encounter (Signed)
Received refill request electronically from pharmacy. Last office visit 02/27/13. See allergy/contraindication. Is it okay to refill medication?

## 2013-03-20 ENCOUNTER — Other Ambulatory Visit: Payer: Self-pay | Admitting: Family Medicine

## 2013-03-20 ENCOUNTER — Encounter: Payer: Self-pay | Admitting: Family Medicine

## 2013-03-21 ENCOUNTER — Encounter: Payer: Self-pay | Admitting: Family Medicine

## 2013-03-21 MED ORDER — ESOMEPRAZOLE MAGNESIUM 20 MG PO CPDR: 20.0000 mg | DELAYED_RELEASE_CAPSULE | Freq: Two times a day (BID) | ORAL | Status: DC

## 2013-03-21 NOTE — Telephone Encounter (Signed)
done

## 2013-03-21 NOTE — Telephone Encounter (Signed)
Electronic refill request, please advise  

## 2013-03-21 NOTE — Telephone Encounter (Signed)
Refill both for 12 mo please

## 2013-04-03 ENCOUNTER — Other Ambulatory Visit: Payer: Self-pay | Admitting: Family Medicine

## 2013-04-03 NOTE — Telephone Encounter (Signed)
Please refill for 6 mo 

## 2013-04-03 NOTE — Telephone Encounter (Signed)
Electronic refill request, no recent f/u/CPE and no future appt, please advise

## 2013-04-04 NOTE — Telephone Encounter (Signed)
done

## 2013-04-21 ENCOUNTER — Other Ambulatory Visit: Payer: Self-pay | Admitting: Family Medicine

## 2013-07-17 ENCOUNTER — Other Ambulatory Visit: Payer: Self-pay | Admitting: Family Medicine

## 2013-07-17 NOTE — Telephone Encounter (Signed)
Electronic refill request, please advise  

## 2013-07-17 NOTE — Telephone Encounter (Signed)
Please schedule f/u in 6 mo and refill until then 

## 2013-07-20 NOTE — Telephone Encounter (Signed)
Pt wanted a CPE instead of a f/u appt scheduled and meds refilled until then

## 2013-08-23 ENCOUNTER — Other Ambulatory Visit: Payer: Self-pay

## 2013-08-27 ENCOUNTER — Other Ambulatory Visit: Payer: Self-pay | Admitting: Family Medicine

## 2013-09-04 ENCOUNTER — Emergency Department: Payer: Self-pay | Admitting: Emergency Medicine

## 2013-09-18 ENCOUNTER — Ambulatory Visit: Payer: Self-pay | Admitting: Family Medicine

## 2013-09-19 ENCOUNTER — Encounter: Payer: Self-pay | Admitting: Family Medicine

## 2013-09-25 ENCOUNTER — Other Ambulatory Visit: Payer: Self-pay | Admitting: Family Medicine

## 2013-09-25 NOTE — Telephone Encounter (Signed)
done

## 2013-09-25 NOTE — Telephone Encounter (Signed)
Please refil times 2

## 2013-09-25 NOTE — Telephone Encounter (Signed)
Electronic refill request, please advise  

## 2013-09-29 ENCOUNTER — Other Ambulatory Visit: Payer: Self-pay | Admitting: Family Medicine

## 2013-12-18 ENCOUNTER — Other Ambulatory Visit: Payer: Self-pay | Admitting: Family Medicine

## 2013-12-24 ENCOUNTER — Other Ambulatory Visit: Payer: Self-pay | Admitting: Family Medicine

## 2013-12-24 NOTE — Telephone Encounter (Signed)
Please refill for a year She has an appt soon-thanks

## 2013-12-24 NOTE — Telephone Encounter (Signed)
done

## 2013-12-24 NOTE — Telephone Encounter (Signed)
Electronic refill request, please advise  

## 2014-01-10 ENCOUNTER — Telehealth: Payer: Self-pay | Admitting: Family Medicine

## 2014-01-10 DIAGNOSIS — E119 Type 2 diabetes mellitus without complications: Secondary | ICD-10-CM

## 2014-01-10 DIAGNOSIS — Z0000 Encounter for general adult medical examination without abnormal findings: Secondary | ICD-10-CM

## 2014-01-10 DIAGNOSIS — M949 Disorder of cartilage, unspecified: Secondary | ICD-10-CM

## 2014-01-10 DIAGNOSIS — E559 Vitamin D deficiency, unspecified: Secondary | ICD-10-CM

## 2014-01-10 DIAGNOSIS — M899 Disorder of bone, unspecified: Secondary | ICD-10-CM

## 2014-01-10 NOTE — Telephone Encounter (Signed)
Message copied by Abner Greenspan on Thu Jan 10, 2014  1:58 PM ------      Message from: Ellamae Sia      Created: Mon Jan 07, 2014 12:09 PM      Regarding: Lab orders for Friday, 3.27.15       Patient is scheduled for CPX labs, please order future labs, Thanks , Terri       ------

## 2014-01-11 ENCOUNTER — Other Ambulatory Visit: Payer: Managed Care, Other (non HMO)

## 2014-01-15 ENCOUNTER — Encounter: Payer: Managed Care, Other (non HMO) | Admitting: Family Medicine

## 2014-02-27 ENCOUNTER — Other Ambulatory Visit: Payer: Self-pay | Admitting: Family Medicine

## 2014-03-27 ENCOUNTER — Other Ambulatory Visit: Payer: Self-pay | Admitting: Cardiovascular Disease

## 2014-03-27 ENCOUNTER — Other Ambulatory Visit: Payer: Self-pay | Admitting: Family Medicine

## 2014-04-09 ENCOUNTER — Other Ambulatory Visit: Payer: Self-pay

## 2014-04-09 MED ORDER — TORSEMIDE 20 MG PO TABS: 20.0000 mg | ORAL_TABLET | Freq: Two times a day (BID) | ORAL | Status: DC | PRN

## 2014-04-09 MED ORDER — METOPROLOL SUCCINATE ER 50 MG PO TB24: 50.0000 mg | ORAL_TABLET | Freq: Two times a day (BID) | ORAL | Status: DC

## 2014-04-09 NOTE — Telephone Encounter (Signed)
Pt is scheduled to see Ignacia Bayley NP on 04/17/2014

## 2014-04-17 ENCOUNTER — Ambulatory Visit (INDEPENDENT_AMBULATORY_CARE_PROVIDER_SITE_OTHER): Payer: Managed Care, Other (non HMO) | Admitting: Nurse Practitioner

## 2014-04-17 ENCOUNTER — Encounter: Payer: Self-pay | Admitting: Nurse Practitioner

## 2014-04-17 VITALS — BP 100/78 | HR 80 | Ht 62.5 in | Wt 197.0 lb

## 2014-04-17 DIAGNOSIS — I10 Essential (primary) hypertension: Secondary | ICD-10-CM

## 2014-04-17 DIAGNOSIS — R600 Localized edema: Secondary | ICD-10-CM | POA: Insufficient documentation

## 2014-04-17 DIAGNOSIS — R609 Edema, unspecified: Secondary | ICD-10-CM

## 2014-04-17 DIAGNOSIS — R002 Palpitations: Secondary | ICD-10-CM

## 2014-04-17 MED ORDER — METOPROLOL SUCCINATE ER 50 MG PO TB24: 50.0000 mg | ORAL_TABLET | Freq: Two times a day (BID) | ORAL | Status: DC

## 2014-04-17 MED ORDER — TORSEMIDE 20 MG PO TABS: 20.0000 mg | ORAL_TABLET | Freq: Two times a day (BID) | ORAL | Status: DC

## 2014-04-17 NOTE — Progress Notes (Signed)
Patient Name: Tina Berger Date of Encounter: 04/17/2014  Primary Care Provider:  Loura Pardon, MD Primary Cardiologist:  Johnny Bridge, MD   Patient Profile  63 year old female with prior history of palpitations, hypertension, and chronic lower extremity edema who presents for followup.  Problem List   Past Medical History  Diagnosis Date  . GERD (gastroesophageal reflux disease)   . HLD (hyperlipidemia)   . HTN (hypertension)   . Hypothyroidism   . Osteoarthritis     hands  . Anemia, iron deficiency   . Diabetes mellitus type II     Diet controlled  . Edema   . Raynaud's disease   . Fibromyalgia   . Dry eyes   . Recurrent HSV (herpes simplex virus)     lesions in nose  . HNP (herniated nucleus pulposus) 2/99    T6,7,8 with DJD  . DJD (degenerative joint disease)   . Cervical dysplasia     abnormal paps  . Left ovarian cyst     x 3, rupture  . Osteopenia     mild-11/01; improved 12/05  . Diverticulosis   . Hemorrhoids     external  . Interstitial lung disease     ?  . Palpitations   . Bilateral lower extremity edema     a. uses torsemide   Past Surgical History  Procedure Laterality Date  . Hand surgery      left thumb  . Colonoscopy  11/01    Diverticulosis; hemorrhoids  . Lasik      bilateral    Allergies  Allergies  Allergen Reactions  . Keflex [Cephalexin] Anaphylaxis  . Penicillins Anaphylaxis  . Amitriptyline Hcl     REACTION: sedating  . Atorvastatin     REACTION: muscle  . Benicar [Olmesartan Medoxomil]     Muscle pain   . Ceftin [Cefuroxime]     Swelling, "legs turn blue"  . Cetirizine Hcl     REACTION: headache  . Ciprofloxacin     REACTION: ? rash vs sun rxn  . Clonidine Derivatives     Swelling   . Diltiazem Hcl     REACTION: reaction not known  . Diovan [Valsartan]     Thought it made her feel confused  . Erythromycin     Rash, swollen gums   . Etodolac     REACTION: reaction not known  . Fluoxetine Hcl     REACTION:  stomach problems  . Furosemide     REACTION: swelling  . Gabapentin     REACTION: edema  . Naproxen Sodium     REACTION: edema  . Paroxetine     REACTION: weight gain  . Pregabalin     REACTION: swelling  . Sulfonamide Derivatives     Rash, swollen gums, lips  . Tetracycline     REACTION:inflammed genitals  . Torsemide     REACTION: swelling  . Venlafaxine     REACTION: sweating  . Cephalexin Hives and Rash    HPI  63 year old female with the above problem list.  She was last seen in clinic here in 2013 for chronic lower extremity edema palpitations, and management of hypertension.  She has been on the beta blocker therapy for palpitations over the years and has done well.  She does occasionally experience palpitations which are generally short-lived and not significantly impact her livelihood.  She stopped taking coffee about a month ago and thinks that her frequency of palpitations has been reduced even further.  Overall she is satisfied with her current dose of beta blocker therapy and its management of her palpitations.  She also has a history of chronic lower extremity edema which has been managed with torsemide.  She is currently taking 20 mg b.i.d and her edema is very well controlled.  She denies chest pain, dyspnea, PND, orthopnea, dizziness, syncope, or early satiety.  Home Medications  Prior to Admission medications   Medication Sig Start Date End Date Taking? Authorizing Provider  buPROPion (WELLBUTRIN XL) 150 MG 24 hr tablet TAKE 1 TABLET DAILY 12/24/13  Yes Abner Greenspan, MD  Chlorphen-Phenyleph-ASA (ALKA-SELTZER PLUS COLD PO) Take by mouth as needed.     Yes Historical Provider, MD  cyclobenzaprine (FLEXERIL) 10 MG tablet TAKE ONE-HALF TO ONE TABLET BY MOUTH ONCE DAILY AND TAKE ONE TABLET AT BEDTIME  09/25/13  Yes Abner Greenspan, MD  DIGESTIVE ENZYMES PO Take by mouth daily.    Yes Historical Provider, MD  diphenhydrAMINE (BENADRYL) 25 mg capsule Take 25 mg by mouth as  needed.     Yes Historical Provider, MD  DULoxetine (CYMBALTA) 60 MG capsule TAKE 1 CAPSULE DAILY 03/20/13  Yes Abner Greenspan, MD  esomeprazole (NEXIUM) 20 MG capsule Take 1 capsule (20 mg total) by mouth 2 (two) times daily. 03/21/13  Yes Abner Greenspan, MD  glucose blood test strip One Touch Ultra stripts blue-To check sugar once daily and as needed for DM2 250.00 03/17/12  Yes Abner Greenspan, MD  GUAIFENESIN CR PO Take by mouth as needed.     Yes Historical Provider, MD  hyoscyamine (LEVSIN SL) 0.125 MG SL tablet Take 1 tablet (0.125 mg total) by mouth every 4 (four) hours as needed for cramping. 06/01/12  Yes Sable Feil, MD  KRILL OIL PO Take by mouth daily.   Yes Historical Provider, MD  levofloxacin (LEVAQUIN) 500 MG tablet Take 500 mg by mouth as needed. 08/08/12  Yes Lucille Passy, MD  levothyroxine (SYNTHROID, LEVOTHROID) 150 MCG tablet TAKE 1 TABLET DAILY   Yes Abner Greenspan, MD  Magnesium 400 MG CAPS Take by mouth daily.    Yes Historical Provider, MD  metFORMIN (GLUCOPHAGE) 500 MG tablet TAKE 1 TABLET TWICE A DAY WITH A MEAL 08/27/13  Yes Abner Greenspan, MD  metoprolol succinate (TOPROL-XL) 50 MG 24 hr tablet Take 1 tablet (50 mg total) by mouth 2 (two) times daily. 04/17/14  Yes Rogelia Mire, NP  NASONEX 50 MCG/ACT nasal spray USE 2 SPRAYS IN NOSTIL(S) DAILY AS DIRECTED 08/27/13  Yes Abner Greenspan, MD  oxyCODONE (OXYCONTIN) 10 MG 12 hr tablet Take 10 mg by mouth 2 (two) times daily.    Yes Historical Provider, MD  oxymorphone (OPANA ER) 20 MG 12 hr tablet Take 20 mg by mouth every 12 (twelve) hours.   Yes Historical Provider, MD  ramipril (ALTACE) 5 MG capsule TAKE 1 CAPSULE DAILY 03/09/13  Yes Abner Greenspan, MD  ranitidine (ZANTAC) 300 MG tablet TAKE 1 TABLET AT BEDTIME 03/27/14  Yes Abner Greenspan, MD  torsemide (DEMADEX) 20 MG tablet Take 1 tablet (20 mg total) by mouth 2 (two) times daily. 04/17/14  Yes Rogelia Mire, NP    Review of Systems  Overall she is doing well.  She  is now retired.  Activities are somewhat limited by degenerative disc disease but overall she feels that she is stable.  She denies chest pain, palpitations, dyspnea, pnd, orthopnea, n, v, dizziness, syncope, weight  gain, or early satiety.  All other systems reviewed and are otherwise negative except as noted above.  Physical Exam  Blood pressure 100/78, pulse 80, height 5' 2.5" (1.588 m), weight 197 lb (89.359 kg).  General: Pleasant, NAD Psych: Normal affect. Neuro: Alert and oriented X 3. Moves all extremities spontaneously. HEENT: Normal  Neck: Supple without bruits or JVD. Lungs:  Resp regular and unlabored, CTA. Heart: RRR no s3, s4, or murmurs. Abdomen: Soft, non-tender, non-distended, BS + x 4.  Extremities: No clubbing, cyanosis or edema. DP/PT/Radials 2+ and equal bilaterally.  Accessory Clinical Findings  ECG - RSR, 80, no acute st/t changes.  Assessment & Plan  1.  Chronic bilateral Lower extremity edema: This is been well-managed with oral torsemide therapy.  She recently ran out of her torsemide and was granted a 30 day prescription.  She has not had blood work since 2013.  I will check a basic metabolic panel today and plan to refill her torsemide 20 mg b.i.d.  We'll be in touch with regards to the results of her basic metabolic panel.  2.  Palpitations: These appear to be stable on her current dose of beta blocker.  We will refill her beta blocker today.    3.  Hypertension: Stable.  Blood pressure somewhat low but she says it runs around 132 systolically.  4.  Disposition: Followup labs today.  Followup in one year.  Murray Hodgkins, NP 04/17/2014, 3:12 PM

## 2014-04-17 NOTE — Patient Instructions (Signed)
Your physician recommends that you have labs today:  BMP   Your physician wants you to follow-up in: 1 year with Dr. [name]. You will receive a reminder letter in the mail two months in advance. If you don't receive a letter, please call our office to schedule the follow-up appointment.     Metoprolol and Torsemide have been reordered

## 2014-04-18 LAB — BASIC METABOLIC PANEL
BUN / CREAT RATIO: 24 (ref 11–26)
BUN: 36 mg/dL — ABNORMAL HIGH (ref 8–27)
CALCIUM: 9.1 mg/dL (ref 8.7–10.3)
CO2: 23 mmol/L (ref 18–29)
CREATININE: 1.53 mg/dL — AB (ref 0.57–1.00)
Chloride: 90 mmol/L — ABNORMAL LOW (ref 97–108)
GFR calc Af Amer: 41 mL/min/{1.73_m2} — ABNORMAL LOW (ref >59)
GFR calc non Af Amer: 36 mL/min/{1.73_m2} — ABNORMAL LOW (ref >59)
Glucose: 112 mg/dL — ABNORMAL HIGH (ref 65–99)
Potassium: 5 mmol/L (ref 3.5–5.2)
SODIUM: 139 mmol/L (ref 134–144)

## 2014-04-22 ENCOUNTER — Other Ambulatory Visit: Payer: Self-pay | Admitting: Family Medicine

## 2014-04-22 ENCOUNTER — Telehealth: Payer: Self-pay

## 2014-04-22 NOTE — Telephone Encounter (Signed)
Spoke w/ pt.  Advised her that her demographics have been updated.

## 2014-04-22 NOTE — Telephone Encounter (Signed)
Please fill all to get through until next appt, thanks

## 2014-04-22 NOTE — Telephone Encounter (Signed)
last OV with you was 02/27/2013--Cymbalta last filled 03/20/13 #90 with 3 refills--upcoming appt 06/14/14--please advise if okay to refill

## 2014-04-22 NOTE — Telephone Encounter (Signed)
Pt place of employment called, states pt no longer works, has retired.

## 2014-04-23 NOTE — Telephone Encounter (Signed)
done

## 2014-04-24 ENCOUNTER — Other Ambulatory Visit: Payer: Self-pay

## 2014-04-24 DIAGNOSIS — R609 Edema, unspecified: Secondary | ICD-10-CM

## 2014-04-25 ENCOUNTER — Ambulatory Visit (INDEPENDENT_AMBULATORY_CARE_PROVIDER_SITE_OTHER): Payer: Managed Care, Other (non HMO) | Admitting: *Deleted

## 2014-04-25 DIAGNOSIS — R609 Edema, unspecified: Secondary | ICD-10-CM

## 2014-04-26 LAB — BASIC METABOLIC PANEL
BUN / CREAT RATIO: 18 (ref 11–26)
BUN: 15 mg/dL (ref 8–27)
CO2: 26 mmol/L (ref 18–29)
CREATININE: 0.85 mg/dL (ref 0.57–1.00)
Calcium: 9.5 mg/dL (ref 8.7–10.3)
Chloride: 92 mmol/L — ABNORMAL LOW (ref 97–108)
GFR, EST AFRICAN AMERICAN: 84 mL/min/{1.73_m2} (ref >59)
GFR, EST NON AFRICAN AMERICAN: 73 mL/min/{1.73_m2} (ref >59)
Glucose: 97 mg/dL (ref 65–99)
Potassium: 4.9 mmol/L (ref 3.5–5.2)
Sodium: 136 mmol/L (ref 134–144)

## 2014-05-14 ENCOUNTER — Other Ambulatory Visit: Payer: Self-pay | Admitting: Family Medicine

## 2014-05-23 ENCOUNTER — Telehealth: Payer: Self-pay

## 2014-05-23 DIAGNOSIS — R609 Edema, unspecified: Secondary | ICD-10-CM

## 2014-05-23 NOTE — Telephone Encounter (Signed)
Pt reports that swelling has not decreased.   Can she resume torsemide BID?

## 2014-05-23 NOTE — Telephone Encounter (Signed)
Pt states she is still having swelling especially on her left side, feet, legs, arms. Pt scheduled appt for 8/14

## 2014-05-23 NOTE — Telephone Encounter (Signed)
Given that she does in fact have a follow up appointment with Dr. [name] on 05/31/2014 to recheck her renal function it would be ok for her to resume her medication at bid dosing for this week with that visit allowing for a recheck BMET. She must keep that appointment.

## 2014-05-24 NOTE — Telephone Encounter (Signed)
Spoke w/ pt.  Advised her of Ryan's recommendation.  She is agreeable and will keep appt on Fri.

## 2014-05-31 ENCOUNTER — Encounter: Payer: Self-pay | Admitting: Cardiovascular Disease

## 2014-05-31 ENCOUNTER — Ambulatory Visit (INDEPENDENT_AMBULATORY_CARE_PROVIDER_SITE_OTHER): Payer: Managed Care, Other (non HMO) | Admitting: Cardiovascular Disease

## 2014-05-31 VITALS — BP 124/88 | HR 90 | Ht 62.5 in | Wt 204.2 lb

## 2014-05-31 DIAGNOSIS — E785 Hyperlipidemia, unspecified: Secondary | ICD-10-CM

## 2014-05-31 DIAGNOSIS — N289 Disorder of kidney and ureter, unspecified: Secondary | ICD-10-CM | POA: Insufficient documentation

## 2014-05-31 DIAGNOSIS — R609 Edema, unspecified: Secondary | ICD-10-CM

## 2014-05-31 DIAGNOSIS — R600 Localized edema: Secondary | ICD-10-CM

## 2014-05-31 DIAGNOSIS — I10 Essential (primary) hypertension: Secondary | ICD-10-CM

## 2014-05-31 HISTORY — DX: Disorder of kidney and ureter, unspecified: N28.9

## 2014-05-31 NOTE — Assessment & Plan Note (Signed)
Repeat blood pressure. She was agitated this morning and blood pressure was higher than her baseline as the front door was locked to the building when she arrived

## 2014-05-31 NOTE — Progress Notes (Signed)
Patient ID: Tina Berger, female    DOB: 1950-10-24, 63 y.o.   MRN: [id number]  HPI Comments: Tina Berger is a 63 year old woman with a history of Raynaud's disease, hyperlipidemia, HTN, obesity, chronic lower extremity swelling who presents for followup of her palpitations and hypertension, edema.  On previous visits, she has had significant, painful edema. Typically she take torsemide 20 mg daily, but was taking this twice a day for severe edema. On torsemide 20 g twice a day, she had renal dysfunction. Lab work 2 weeks ago showing elevated BUN and creatinine. It was recommended that she initially hold her torsemide and then take torsemide 20 mg daily  She continues to have problems with Raynaud's, particularly noticeable in her toes She continues to drink a significant amount of fluid during the daytime She reports that on the metoprolol, her palpitations have been well-controlled.    She does take short acting diltiazem as needed for worsening Raynaud's of the feet   Echocardiogram in October 2008 was essentially normal.   Stress test in October 2008 showed no ischemia. This is a exercise treadmill. She exercised for 4 minutes 30 seconds, peak heart rate of 152 beats per minute, peak blood pressure 172/94.   EKG shows normal sinus rhythm with a rate of 89 beats per minute, no significant ST or T wave changes.      Outpatient Encounter Prescriptions as of 64/14/2015  Medication Sig  . buPROPion (WELLBUTRIN XL) 150 MG 24 hr tablet TAKE 1 TABLET DAILY  . Chlorphen-Phenyleph-ASA (ALKA-SELTZER PLUS COLD PO) Take by mouth as needed.    . cyclobenzaprine (FLEXERIL) 10 MG tablet TAKE ONE-HALF TO ONE TABLET BY MOUTH ONCE DAILY AND TAKE ONE TABLET AT BEDTIME   . DIGESTIVE ENZYMES PO Take by mouth daily.   . diphenhydrAMINE (BENADRYL) 25 mg capsule Take 25 mg by mouth as needed.    . DULoxetine (CYMBALTA) 60 MG capsule TAKE 1 CAPSULE DAILY  . esomeprazole (NEXIUM) 20 MG capsule Take 1 capsule  (20 mg total) by mouth 2 (two) times daily.  Marland Kitchen glucose blood test strip One Touch Ultra stripts blue-To check sugar once daily and as needed for DM2 250.00  . GUAIFENESIN CR PO Take by mouth as needed.    . hyoscyamine (LEVSIN SL) 0.125 MG SL tablet Take 1 tablet (0.125 mg total) by mouth every 4 (four) hours as needed for cramping.  Marland Kitchen KRILL OIL PO Take by mouth daily.  Marland Kitchen levofloxacin (LEVAQUIN) 500 MG tablet Take 500 mg by mouth as needed.  Marland Kitchen levothyroxine (SYNTHROID, LEVOTHROID) 150 MCG tablet TAKE 1 TABLET DAILY  . Magnesium 400 MG CAPS Take by mouth daily.   . metFORMIN (GLUCOPHAGE) 500 MG tablet TAKE 1 TABLET TWICE A DAY WITH MEALS  . metoprolol succinate (TOPROL-XL) 50 MG 24 hr tablet Take 1 tablet (50 mg total) by mouth 2 (two) times daily.  Marland Kitchen NASONEX 50 MCG/ACT nasal spray USE 2 SPRAYS IN NOSTIL(S) DAILY AS DIRECTED  . oxyCODONE (OXYCONTIN) 10 MG 12 hr tablet Take 10 mg by mouth 2 (two) times daily.   Marland Kitchen oxymorphone (OPANA ER) 20 MG 12 hr tablet Take 20 mg by mouth every 12 (twelve) hours.  . ramipril (ALTACE) 5 MG capsule TAKE 1 CAPSULE DAILY  . ranitidine (ZANTAC) 300 MG tablet TAKE 1 TABLET AT BEDTIME  . torsemide (DEMADEX) 20 MG tablet Take 1 tablet (20 mg total) by mouth 2 (two) times daily.    Review of Systems  Constitutional: Negative.  HENT: Negative.   Eyes: Negative.   Respiratory: Negative.   Cardiovascular: Negative.   Gastrointestinal: Negative.   Endocrine: Negative.   Musculoskeletal: Negative.   Skin: Negative.   Allergic/Immunologic: Negative.   Neurological: Negative.   Hematological: Negative.   Psychiatric/Behavioral: Negative.   All other systems reviewed and are negative.   BP 132/100  Pulse 90  Ht 5' 2.5" (1.588 m)  Wt 204 lb 4 oz (92.647 kg)  BMI 36.74 kg/m2  Physical Exam  Nursing note and vitals reviewed. Constitutional: She is oriented to person, place, and time. She appears well-developed and well-nourished.  Obese  HENT:  Head:  Normocephalic.  Nose: Nose normal.  Mouth/Throat: Oropharynx is clear and moist.  Eyes: Conjunctivae are normal. Pupils are equal, round, and reactive to light.  Neck: Normal range of motion. Neck supple. No JVD present.  Cardiovascular: Normal rate, regular rhythm, S1 normal, S2 normal, normal heart sounds and intact distal pulses.  Exam reveals no gallop and no friction rub.   No murmur heard. Trace pitting edema around the ankles and feet  Pulmonary/Chest: Effort normal and breath sounds normal. No respiratory distress. She has no wheezes. She has no rales. She exhibits no tenderness.  Abdominal: Soft. Bowel sounds are normal. She exhibits no distension. There is no tenderness.  Musculoskeletal: Normal range of motion. She exhibits no edema and no tenderness.  Lymphadenopathy:    She has no cervical adenopathy.  Neurological: She is alert and oriented to person, place, and time. Coordination normal.  Skin: Skin is warm and dry. No rash noted. No erythema.  Psychiatric: She has a normal mood and affect. Her behavior is normal. Judgment and thought content normal.    Assessment and Plan

## 2014-05-31 NOTE — Assessment & Plan Note (Signed)
Long-standing history of leg swelling. Suspect she has chronic venous insufficiency. She does drink significant amount of fluids daily, unable to exclude mild diastolic CHF. Recommended she try to moderate her fluid intake, take torsemide one a day as needed, try to avoid torsemide twice a day. Extra torsemide for worsening swelling that is painful

## 2014-05-31 NOTE — Assessment & Plan Note (Signed)
No recent lipid panel available. Recommended diet, exercise. Prior reaction on Lipitor

## 2014-05-31 NOTE — Assessment & Plan Note (Signed)
Renal function improved back to her baseline. 2 weeks ago,  had elevated BUN and creatinine likely from dehydration. Suggested she try not to use too much torsemide

## 2014-05-31 NOTE — Patient Instructions (Signed)
You are doing well. No medication changes were made.  Please call us if you have new issues that need to be addressed before your next appt.  Your physician wants you to follow-up in: 12 months.  You will receive a reminder letter in the mail two months in advance. If you don't receive a letter, please call our office to schedule the follow-up appointment. 

## 2014-06-09 ENCOUNTER — Other Ambulatory Visit: Payer: Self-pay | Admitting: Family Medicine

## 2014-06-11 ENCOUNTER — Other Ambulatory Visit (INDEPENDENT_AMBULATORY_CARE_PROVIDER_SITE_OTHER): Payer: Managed Care, Other (non HMO)

## 2014-06-11 DIAGNOSIS — M949 Disorder of cartilage, unspecified: Secondary | ICD-10-CM

## 2014-06-11 DIAGNOSIS — M899 Disorder of bone, unspecified: Secondary | ICD-10-CM

## 2014-06-11 DIAGNOSIS — E559 Vitamin D deficiency, unspecified: Secondary | ICD-10-CM

## 2014-06-11 DIAGNOSIS — Z0000 Encounter for general adult medical examination without abnormal findings: Secondary | ICD-10-CM

## 2014-06-11 DIAGNOSIS — E119 Type 2 diabetes mellitus without complications: Secondary | ICD-10-CM

## 2014-06-11 LAB — COMPREHENSIVE METABOLIC PANEL
ALT: 16 U/L (ref 0–35)
AST: 24 U/L (ref 0–37)
Albumin: 3.7 g/dL (ref 3.5–5.2)
Alkaline Phosphatase: 93 U/L (ref 39–117)
BILIRUBIN TOTAL: 0.3 mg/dL (ref 0.2–1.2)
BUN: 19 mg/dL (ref 6–23)
CHLORIDE: 99 meq/L (ref 96–112)
CO2: 30 meq/L (ref 19–32)
CREATININE: 1 mg/dL (ref 0.4–1.2)
Calcium: 8.4 mg/dL (ref 8.4–10.5)
GFR: 62.88 mL/min (ref >60.00)
Glucose, Bld: 95 mg/dL (ref 70–99)
Potassium: 4.7 mEq/L (ref 3.5–5.1)
Sodium: 137 mEq/L (ref 135–145)
Total Protein: 7 g/dL (ref 6.0–8.3)

## 2014-06-11 LAB — CBC WITH DIFFERENTIAL/PLATELET
BASOS ABS: 0.1 10*3/uL (ref 0.0–0.1)
BASOS PCT: 1.1 % (ref 0.0–3.0)
EOS ABS: 0.5 10*3/uL (ref 0.0–0.7)
Eosinophils Relative: 7.4 % — ABNORMAL HIGH (ref 0.0–5.0)
HCT: 34.3 % — ABNORMAL LOW (ref 36.0–46.0)
HEMOGLOBIN: 10.7 g/dL — AB (ref 12.0–15.0)
LYMPHS ABS: 2 10*3/uL (ref 0.7–4.0)
LYMPHS PCT: 31.2 % (ref 12.0–46.0)
MCHC: 31.1 g/dL (ref 30.0–36.0)
MCV: 80.5 fl (ref 78.0–100.0)
Monocytes Absolute: 0.8 10*3/uL (ref 0.1–1.0)
Monocytes Relative: 13.1 % — ABNORMAL HIGH (ref 3.0–12.0)
NEUTROS ABS: 3 10*3/uL (ref 1.4–7.7)
Neutrophils Relative %: 47.2 % (ref 43.0–77.0)
Platelets: 353 10*3/uL (ref 150.0–400.0)
RBC: 4.26 Mil/uL (ref 3.87–5.11)
RDW: 15.4 % (ref 11.5–15.5)
WBC: 6.3 10*3/uL (ref 4.0–10.5)

## 2014-06-11 LAB — LIPID PANEL
Cholesterol: 210 mg/dL — ABNORMAL HIGH (ref 0–200)
HDL: 49.4 mg/dL (ref >39.00)
LDL Cholesterol: 126 mg/dL — ABNORMAL HIGH (ref 0–99)
NonHDL: 160.6
TRIGLYCERIDES: 174 mg/dL — AB (ref 0.0–149.0)
Total CHOL/HDL Ratio: 4
VLDL: 34.8 mg/dL (ref 0.0–40.0)

## 2014-06-11 LAB — HEMOGLOBIN A1C: Hgb A1c MFr Bld: 6.8 % — ABNORMAL HIGH (ref 4.6–6.5)

## 2014-06-11 LAB — VITAMIN D 25 HYDROXY (VIT D DEFICIENCY, FRACTURES): VITD: 27.46 ng/mL — ABNORMAL LOW (ref 30.00–100.00)

## 2014-06-11 LAB — TSH: TSH: 3.04 u[IU]/mL (ref 0.35–4.50)

## 2014-06-11 NOTE — Addendum Note (Signed)
Addended by: Ellamae Sia on: 06/11/2014 10:57 AM   Modules accepted: Orders

## 2014-06-14 ENCOUNTER — Encounter: Payer: Self-pay | Admitting: Family Medicine

## 2014-06-14 ENCOUNTER — Ambulatory Visit (INDEPENDENT_AMBULATORY_CARE_PROVIDER_SITE_OTHER): Payer: Managed Care, Other (non HMO) | Admitting: Family Medicine

## 2014-06-14 ENCOUNTER — Other Ambulatory Visit (HOSPITAL_COMMUNITY)
Admission: RE | Admit: 2014-06-14 | Discharge: 2014-06-14 | Disposition: A | Discharge status: Home / Self Care | Payer: Managed Care, Other (non HMO) | Source: Ambulatory Visit | Attending: Family Medicine | Admitting: Family Medicine

## 2014-06-14 VITALS — BP 134/82 | HR 96 | Temp 99.3°F | Ht 62.0 in | Wt 206.5 lb

## 2014-06-14 DIAGNOSIS — I10 Essential (primary) hypertension: Secondary | ICD-10-CM

## 2014-06-14 DIAGNOSIS — Z23 Encounter for immunization: Secondary | ICD-10-CM

## 2014-06-14 DIAGNOSIS — D509 Iron deficiency anemia, unspecified: Secondary | ICD-10-CM

## 2014-06-14 DIAGNOSIS — Z1151 Encounter for screening for human papillomavirus (HPV): Secondary | ICD-10-CM | POA: Diagnosis present

## 2014-06-14 DIAGNOSIS — M949 Disorder of cartilage, unspecified: Secondary | ICD-10-CM

## 2014-06-14 DIAGNOSIS — Z01419 Encounter for gynecological examination (general) (routine) without abnormal findings: Secondary | ICD-10-CM

## 2014-06-14 DIAGNOSIS — E119 Type 2 diabetes mellitus without complications: Secondary | ICD-10-CM | POA: Insufficient documentation

## 2014-06-14 DIAGNOSIS — E559 Vitamin D deficiency, unspecified: Secondary | ICD-10-CM

## 2014-06-14 DIAGNOSIS — Z1211 Encounter for screening for malignant neoplasm of colon: Secondary | ICD-10-CM

## 2014-06-14 DIAGNOSIS — E669 Obesity, unspecified: Secondary | ICD-10-CM

## 2014-06-14 DIAGNOSIS — J029 Acute pharyngitis, unspecified: Secondary | ICD-10-CM

## 2014-06-14 DIAGNOSIS — Z79899 Other long term (current) drug therapy: Secondary | ICD-10-CM | POA: Insufficient documentation

## 2014-06-14 DIAGNOSIS — E039 Hypothyroidism, unspecified: Secondary | ICD-10-CM

## 2014-06-14 DIAGNOSIS — Z0000 Encounter for general adult medical examination without abnormal findings: Secondary | ICD-10-CM

## 2014-06-14 DIAGNOSIS — M899 Disorder of bone, unspecified: Secondary | ICD-10-CM

## 2014-06-14 HISTORY — DX: Encounter for gynecological examination (general) (routine) without abnormal findings: Z01.419

## 2014-06-14 HISTORY — DX: Encounter for screening for malignant neoplasm of colon: Z12.11

## 2014-06-14 LAB — POCT RAPID STREP A (OFFICE): Rapid Strep A Screen: NEGATIVE

## 2014-06-14 LAB — FERRITIN: Ferritin: 7.5 ng/mL — ABNORMAL LOW (ref 10.0–291.0)

## 2014-06-14 LAB — VITAMIN B12: Vitamin B-12: 811 pg/mL (ref 211–911)

## 2014-06-14 MED ORDER — DICLOFENAC SODIUM 1 % TD GEL: 4.0000 g | Freq: Three times a day (TID) | TRANSDERMAL | Status: DC | PRN

## 2014-06-14 NOTE — Assessment & Plan Note (Signed)
Worse  Lab Results  Component Value Date   HGBA1C 6.8* 06/11/2014   Work on wt loss and better habits  Lab and f/u in 3 mo

## 2014-06-14 NOTE — Progress Notes (Signed)
Subjective:    Patient ID: Tina Berger, female    DOB: 16-Feb-1951, 63 y.o.   MRN: [id number]  HPI Here for health maintenance exam and to review chronic medical problems    Her summer was uneventful   Rapid strep test is negative -had a bit of a st this am  Had a root canal recently  That ear bothers her  Temp 99.3  Wants to go ahead and get her shots anyway   Wt is up 2 lb with bmi of 37 Obese Needs to do better - with diet and exercise  She is working towards a plan   Pneumonia vaccine -has not had one before   Zoster vaccine- wants to get one  Flu vaccine - gets them every fall  Td 2/05   Pap 09 nl  Does not have a gyn  Will do today  No symptoms  No new partner or hx of abn paps   opth exam - needs one   Mammogram 12/14 nl Self exam - no lumps or changes   colonosc 3/11  10 year recall   Anemia Lab Results  Component Value Date   WBC 6.3 06/11/2014   HGB 10.7* 06/11/2014   HCT 34.3* 06/11/2014   MCV 80.5 06/11/2014   PLT 353.0 06/11/2014  she had low HB in the past when she was dieting very strictly  Never had another reason for it    Vit D def D level is 27- has not been taking the supplement  Osteopenia with dexa 9/13 Wants to order it   She has fallen twice but no fractures  In the past she fx wrist in a fall  Tripped with slipper  Also tripped carrying dog  She has gone to PT in the past to work on balance -that was helpful   bp is stable today  No cp or palpitations or headaches or edema  No side effects to medicines  BP Readings from Last 3 Encounters:  06/14/14 134/82  05/31/14 124/88  04/17/14 100/78      DM2 Lab Results  Component Value Date   HGBA1C 6.8* 06/11/2014   up from 6.6 Knows she has to loose some weight and work on diet and exercise   Hyperlipidemia Lab Results  Component Value Date   CHOL 210* 06/11/2014   CHOL 200 01/10/2012   CHOL 226* 10/08/2011   Lab Results  Component Value Date   HDL 49.40 06/11/2014   HDL 52.10 01/10/2012   HDL 53.50 10/08/2011   Lab Results  Component Value Date   LDLCALC 126* 06/11/2014   LDLCALC 127* 01/10/2012   LDLCALC 122* 03/25/2008   Lab Results  Component Value Date   TRIG 174.0* 06/11/2014   TRIG 107.0 01/10/2012   TRIG 180.0* 10/08/2011   Lab Results  Component Value Date   CHOLHDL 4 06/11/2014   CHOLHDL 4 01/10/2012   CHOLHDL 4 10/08/2011   Lab Results  Component Value Date   LDLDIRECT 135.6 10/08/2011   LDLDIRECT 158.9 03/31/2011   LDLDIRECT 146.1 11/24/2009    Overall stable - could do better with diet  Review of Systems Review of Systems  Constitutional: Negative for fever, appetite change,  and unexpected weight change. pos for chronic fatigue  ENT pos for ST and post nasal srip  Eyes: Negative for pain and visual disturbance.  Respiratory: Negative for cough and shortness of breath.   Cardiovascular: Negative for cp or palpitations   pos for Raynaud symptoms  in hands and feet  Gastrointestinal: Negative for nausea, diarrhea and constipation. neg for blood in stool MSK pos for chronic myofascial pain  Genitourinary: Negative for urgency and frequency.  Skin: Negative for pallor or rash   Neurological: Negative for weakness, light-headedness, numbness and headaches.  Hematological: Negative for adenopathy. Does not bruise/bleed easily.  Psychiatric/Behavioral: Negative for dysphoric mood. The patient is not nervous/anxious.         Objective:   Physical Exam  Constitutional: She appears well-developed and well-nourished. No distress.  obese and well appearing   HENT:  Head: Normocephalic and atraumatic.  Right Ear: External ear normal.  Left Ear: External ear normal.  Mouth/Throat: Oropharynx is clear and moist.  Throat is clear  Some clear post nasal drip and rhinorrhea  Eyes: Conjunctivae and EOM are normal. Pupils are equal, round, and reactive to light. Right eye exhibits no discharge. Left eye exhibits no discharge. No scleral  icterus.  Neck: Normal range of motion. Neck supple. No JVD present. Carotid bruit is not present. No thyromegaly present.  Cardiovascular: Normal rate, regular rhythm and intact distal pulses.  Exam reveals no gallop.   Murmur heard. Fingers and toes are cool with purple discoloration from Raynauds (baseline)  Pulmonary/Chest: Effort normal and breath sounds normal. No respiratory distress. She has no wheezes. She exhibits no tenderness.  Abdominal: Soft. Bowel sounds are normal. She exhibits no distension, no abdominal bruit and no mass. There is no tenderness.  Genitourinary: No breast swelling, tenderness, discharge or bleeding. There is no rash, tenderness or lesion on the right labia. There is no rash, tenderness or lesion on the left labia. Uterus is not enlarged and not tender. Cervix exhibits no motion tenderness, no discharge and no friability. Right adnexum displays no mass, no tenderness and no fullness. Left adnexum displays no mass, no tenderness and no fullness. No bleeding around the vagina. No vaginal discharge found.  Breast exam: No mass, nodules, thickening, tenderness, bulging, retraction, inflamation, nipple discharge or skin changes noted.  No axillary or clavicular LA.      Musculoskeletal: Normal range of motion. She exhibits no edema and no tenderness.  Lymphadenopathy:    She has no cervical adenopathy.  Neurological: She is alert. She has normal reflexes. No cranial nerve deficit. She exhibits normal muscle tone. Coordination normal.  Skin: Skin is warm and dry. No rash noted. No erythema. No pallor.  Psychiatric: She has a normal mood and affect.          Assessment & Plan:   Problem List Items Addressed This Visit     Cardiovascular and Mediastinum   HTN (hypertension)      bp in fair control at this time  BP Readings from Last 1 Encounters:  06/14/14 134/82   No changes needed Disc lifstyle change with low sodium diet and exercise   Labs rev Wt loss  enc      Endocrine   HYPOTHYROIDISM      Hypothyroidism  Pt has no clinical changes No change in energy level/ hair or skin/ edema and no tremor Lab Results  Component Value Date   TSH 3.04 06/11/2014        DM type 2 (diabetes mellitus, type 2)      Worse  Lab Results  Component Value Date   HGBA1C 6.8* 06/11/2014   Work on wt loss and better habits  Lab and f/u in 3 mo       Musculoskeletal and Integument   OSTEOPENIA  Will order dexa  Rev D level- to start back on 2000 iu daily-disc imp of this  She has had falls  Disc need for calcium/ vitamin D/ wt bearing exercise and bone density test every 2 y to monitor Disc safety/ fracture risk in detail        Other   UNSPECIFIED VITAMIN D DEFICIENCY     Stressed imp of D to overall and bone health  Will begin 2000 iu daily and stick with it     ANEMIA-IRON DEFICIENCY     Hb 10.7- this is a drop  Pt has not had problems for a while She does not have a lot of iron in diet  Lab today for iron studies and also B12 IFOB stool card given as well    Relevant Orders      Vitamin B12 (Completed)      Ferritin (Completed)      Reticulocytes (Completed)   Routine general medical examination at a health care facility - Primary     Reviewed health habits including diet and exercise and skin cancer prevention Reviewed appropriate screening tests for age  Also reviewed health mt list, fam hx and immunization status , as well as social and family history   She will look into coverage of shingles vaccine as well  Tdap and flu vaccines today Labs reviewed      Obesity     Discussed how this problem influences overall health and the risks it imposes  Reviewed plan for weight loss with lower calorie diet (via better food choices and also portion control or program like weight watchers) and exercise building up to or more than 30 minutes 5 days per week including some aerobic activity       Encounter for routine gynecological  examination     Exam done with pap No c/o    Relevant Orders      Cytology - PAP   Colon cancer screening     IFOB card given  Noted anemia as well No bowel changes per pt     Relevant Orders      Fecal occult blood, imunochemical   Sore throat     Mild  RST neg  Suspect early uri or allergies-will watch this    Relevant Orders      Rapid Strep A (Completed)    Other Visit Diagnoses   Need for prophylactic vaccination and inoculation against influenza        Relevant Orders       Flu Vaccine QUAD 36+ mos PF IM (Fluarix Quad PF) (Completed)    Need for Tdap vaccination        Relevant Orders       Tdap vaccine greater than or equal to 7yo IM (Completed)

## 2014-06-14 NOTE — Patient Instructions (Signed)
If you are interested in a shingles/zoster vaccine - call your insurance to check on coverage,( you should not get it within 1 month of other vaccines) , then call us for a prescription  for it to take to a pharmacy that gives the shot , or make a nurse visit to get it here depending on your coverage  flu shot today Tetanus shot today Don't forget to get an annual eye exam Follow up in 3 months with labs prior for glucose control /diabetes  For anemia - additional labs today  Try to eat a balanced diet Work on weight loss  Please do stool card  Start back on vitamin D 2000 iu daily over the counter

## 2014-06-14 NOTE — Assessment & Plan Note (Signed)
bp in fair control at this time  BP Readings from Last 1 Encounters:  06/14/14 134/82   No changes needed Disc lifstyle change with low sodium diet and exercise   Labs rev Wt loss enc

## 2014-06-14 NOTE — Progress Notes (Signed)
Pre visit review using our clinic review tool, if applicable. No additional management support is needed unless otherwise documented below in the visit note. 

## 2014-06-15 LAB — RETICULOCYTES
ABS RETIC: 35 10*3/uL (ref 19.0–186.0)
RBC.: 4.37 MIL/uL (ref 3.87–5.11)
Retic Ct Pct: 0.8 % (ref 0.4–2.3)

## 2014-06-16 ENCOUNTER — Encounter: Payer: Self-pay | Admitting: Family Medicine

## 2014-06-16 DIAGNOSIS — J029 Acute pharyngitis, unspecified: Secondary | ICD-10-CM | POA: Insufficient documentation

## 2014-06-16 NOTE — Assessment & Plan Note (Signed)
Discussed how this problem influences overall health and the risks it imposes  Reviewed plan for weight loss with lower calorie diet (via better food choices and also portion control or program like weight watchers) and exercise building up to or more than 30 minutes 5 days per week including some aerobic activity    

## 2014-06-16 NOTE — Assessment & Plan Note (Signed)
Mild  RST neg  Suspect early uri or allergies-will watch this

## 2014-06-16 NOTE — Assessment & Plan Note (Signed)
Reviewed health habits including diet and exercise and skin cancer prevention Reviewed appropriate screening tests for age  Also reviewed health mt list, fam hx and immunization status , as well as social and family history   She will look into coverage of shingles vaccine as well  Tdap and flu vaccines today Labs reviewed

## 2014-06-16 NOTE — Assessment & Plan Note (Signed)
Exam done with pap No c/o

## 2014-06-16 NOTE — Assessment & Plan Note (Signed)
Stressed imp of D to overall and bone health  Will begin 2000 iu daily and stick with it

## 2014-06-16 NOTE — Assessment & Plan Note (Signed)
Will order dexa  Rev D level- to start back on 2000 iu daily-disc imp of this  She has had falls  Disc need for calcium/ vitamin D/ wt bearing exercise and bone density test every 2 y to monitor Disc safety/ fracture risk in detail

## 2014-06-16 NOTE — Assessment & Plan Note (Signed)
Hb 10.7- this is a drop  Pt has not had problems for a while She does not have a lot of iron in diet  Lab today for iron studies and also B12 IFOB stool card given as well

## 2014-06-16 NOTE — Assessment & Plan Note (Signed)
Hypothyroidism  Pt has no clinical changes No change in energy level/ hair or skin/ edema and no tremor Lab Results  Component Value Date   TSH 3.04 06/11/2014

## 2014-06-16 NOTE — Assessment & Plan Note (Signed)
IFOB card given  Noted anemia as well No bowel changes per pt

## 2014-06-18 LAB — CYTOLOGY - PAP

## 2014-06-20 ENCOUNTER — Other Ambulatory Visit (INDEPENDENT_AMBULATORY_CARE_PROVIDER_SITE_OTHER): Payer: Managed Care, Other (non HMO)

## 2014-06-20 DIAGNOSIS — Z1211 Encounter for screening for malignant neoplasm of colon: Secondary | ICD-10-CM

## 2014-06-20 LAB — FECAL OCCULT BLOOD, IMMUNOCHEMICAL: FECAL OCCULT BLD: NEGATIVE

## 2014-06-30 ENCOUNTER — Other Ambulatory Visit: Payer: Self-pay | Admitting: Family Medicine

## 2014-06-30 ENCOUNTER — Encounter: Payer: Self-pay | Admitting: Family Medicine

## 2014-07-01 ENCOUNTER — Telehealth: Payer: Self-pay | Admitting: Family Medicine

## 2014-07-01 MED ORDER — DICLOFENAC SODIUM 1 % TD GEL: 4.0000 g | Freq: Three times a day (TID) | TRANSDERMAL | Status: DC | PRN

## 2014-07-01 NOTE — Telephone Encounter (Signed)
Sending voltaren gel to exp px

## 2014-07-08 ENCOUNTER — Telehealth: Payer: Self-pay | Admitting: *Deleted

## 2014-07-08 NOTE — Telephone Encounter (Signed)
Is the diclofenac topical solution the same as voltaren gel? - if it is , that is fine with me, but do please ask the patient as well

## 2014-07-08 NOTE — Telephone Encounter (Signed)
Received call from [facility]. Volteran Gel needs a prior auth unless IBP tabs, Diclofenac tabs, or Diclofenac topical solution are not appropriate. Would you consider switching pt to either one of these alternatives?

## 2014-07-09 NOTE — Telephone Encounter (Signed)
Dr. Glori Bickers advise me that the drops (topical solution) is not the same as volteran Gel so she wanted me to call and ask pt if she could take IBP tabs or Diclofenac tabs, pt advise me she can't take either one and request that we do Prior Auth on Volteran Gel.   PA started

## 2014-07-09 NOTE — Telephone Encounter (Signed)
Left voicemail requesting pt to call office back 

## 2014-07-10 NOTE — Telephone Encounter (Signed)
PA form received and placed in your inbox

## 2014-07-10 NOTE — Telephone Encounter (Signed)
It needs ICD 9 code and dx, form given back to Dr. Glori Bickers to provide info

## 2014-07-10 NOTE — Telephone Encounter (Signed)
Done and in IN box, thanks  

## 2014-07-10 NOTE — Telephone Encounter (Signed)
She takes it for fibromyalgia/ back pain with deg disc dz and also OA  In IN box thanks

## 2014-07-11 NOTE — Telephone Encounter (Signed)
PA form faxed.  

## 2014-07-16 NOTE — Telephone Encounter (Signed)
Additional information forms placed in your inbox

## 2014-07-16 NOTE — Telephone Encounter (Signed)
Forms faxed

## 2014-07-16 NOTE — Telephone Encounter (Signed)
Done and in IN box 

## 2014-07-17 ENCOUNTER — Telehealth (INDEPENDENT_AMBULATORY_CARE_PROVIDER_SITE_OTHER): Payer: Managed Care, Other (non HMO) | Admitting: Family Medicine

## 2014-07-17 DIAGNOSIS — D508 Other iron deficiency anemias: Secondary | ICD-10-CM

## 2014-07-17 NOTE — Telephone Encounter (Signed)
Message copied by Abner Greenspan on Wed Jul 17, 2014 10:17 PM ------      Message from: Ellamae Sia      Created: Thu Jul 11, 2014  2:16 PM      Regarding: Lab orders for THURSDAY, 10.1.15       Lab orders ------

## 2014-07-18 ENCOUNTER — Other Ambulatory Visit (INDEPENDENT_AMBULATORY_CARE_PROVIDER_SITE_OTHER): Payer: Managed Care, Other (non HMO)

## 2014-07-18 DIAGNOSIS — D508 Other iron deficiency anemias: Secondary | ICD-10-CM

## 2014-07-18 LAB — CBC WITH DIFFERENTIAL/PLATELET
BASOS ABS: 0 10*3/uL (ref 0.0–0.1)
Basophils Relative: 0.8 % (ref 0.0–3.0)
Eosinophils Absolute: 0.4 10*3/uL (ref 0.0–0.7)
Eosinophils Relative: 6.8 % — ABNORMAL HIGH (ref 0.0–5.0)
HCT: 38 % (ref 36.0–46.0)
Hemoglobin: 11.9 g/dL — ABNORMAL LOW (ref 12.0–15.0)
LYMPHS ABS: 2.5 10*3/uL (ref 0.7–4.0)
LYMPHS PCT: 39.9 % (ref 12.0–46.0)
MCHC: 31.4 g/dL (ref 30.0–36.0)
MCV: 82.1 fl (ref 78.0–100.0)
MONO ABS: 0.8 10*3/uL (ref 0.1–1.0)
Monocytes Relative: 13.1 % — ABNORMAL HIGH (ref 3.0–12.0)
NEUTROS ABS: 2.5 10*3/uL (ref 1.4–7.7)
NEUTROS PCT: 39.4 % — AB (ref 43.0–77.0)
Platelets: 326 10*3/uL (ref 150.0–400.0)
RBC: 4.63 Mil/uL (ref 3.87–5.11)
RDW: 17.9 % — ABNORMAL HIGH (ref 11.5–15.5)
WBC: 6.4 10*3/uL (ref 4.0–10.5)

## 2014-07-18 LAB — FERRITIN: Ferritin: 12.1 ng/mL (ref 10.0–291.0)

## 2014-07-22 NOTE — Telephone Encounter (Signed)
PA approved and letter placed in your inbox

## 2014-07-29 ENCOUNTER — Other Ambulatory Visit: Payer: Self-pay | Admitting: Family Medicine

## 2014-07-29 NOTE — Telephone Encounter (Signed)
Will refill electronically  

## 2014-07-29 NOTE — Telephone Encounter (Signed)
Electronic refill request, please advise  

## 2014-08-09 ENCOUNTER — Other Ambulatory Visit: Payer: Self-pay | Admitting: Family Medicine

## 2014-08-13 ENCOUNTER — Other Ambulatory Visit: Payer: Self-pay | Admitting: Family Medicine

## 2014-08-13 NOTE — Telephone Encounter (Signed)
done

## 2014-08-13 NOTE — Telephone Encounter (Signed)
Electronic refill request, please advise  

## 2014-08-13 NOTE — Telephone Encounter (Signed)
Please refill for a year  

## 2014-09-08 ENCOUNTER — Telehealth: Payer: Self-pay | Admitting: Family Medicine

## 2014-09-08 DIAGNOSIS — E119 Type 2 diabetes mellitus without complications: Secondary | ICD-10-CM

## 2014-09-08 DIAGNOSIS — D508 Other iron deficiency anemias: Secondary | ICD-10-CM

## 2014-09-08 NOTE — Telephone Encounter (Signed)
-----   Message from Ellamae Sia sent at 09/05/2014 12:46 PM EST ----- Regarding: Lab orders for 11.23.15 Labs for 3 month f/u

## 2014-09-09 ENCOUNTER — Other Ambulatory Visit (INDEPENDENT_AMBULATORY_CARE_PROVIDER_SITE_OTHER): Payer: Managed Care, Other (non HMO)

## 2014-09-09 DIAGNOSIS — E119 Type 2 diabetes mellitus without complications: Secondary | ICD-10-CM

## 2014-09-09 DIAGNOSIS — D508 Other iron deficiency anemias: Secondary | ICD-10-CM

## 2014-09-09 LAB — CBC WITH DIFFERENTIAL/PLATELET
BASOS PCT: 1 % (ref 0.0–3.0)
Basophils Absolute: 0.1 10*3/uL (ref 0.0–0.1)
EOS ABS: 0.4 10*3/uL (ref 0.0–0.7)
EOS PCT: 6.7 % — AB (ref 0.0–5.0)
HEMATOCRIT: 38.5 % (ref 36.0–46.0)
HEMOGLOBIN: 12.2 g/dL (ref 12.0–15.0)
LYMPHS ABS: 2.5 10*3/uL (ref 0.7–4.0)
Lymphocytes Relative: 37.3 % (ref 12.0–46.0)
MCHC: 31.8 g/dL (ref 30.0–36.0)
MCV: 83 fl (ref 78.0–100.0)
Monocytes Absolute: 0.8 10*3/uL (ref 0.1–1.0)
Monocytes Relative: 12.7 % — ABNORMAL HIGH (ref 3.0–12.0)
NEUTROS ABS: 2.8 10*3/uL (ref 1.4–7.7)
Neutrophils Relative %: 42.3 % — ABNORMAL LOW (ref 43.0–77.0)
Platelets: 261 10*3/uL (ref 150.0–400.0)
RBC: 4.63 Mil/uL (ref 3.87–5.11)
RDW: 17.1 % — ABNORMAL HIGH (ref 11.5–15.5)
WBC: 6.6 10*3/uL (ref 4.0–10.5)

## 2014-09-09 LAB — HEMOGLOBIN A1C: Hgb A1c MFr Bld: 6.4 % (ref 4.6–6.5)

## 2014-09-16 ENCOUNTER — Encounter: Payer: Self-pay | Admitting: Family Medicine

## 2014-09-16 ENCOUNTER — Ambulatory Visit (INDEPENDENT_AMBULATORY_CARE_PROVIDER_SITE_OTHER): Payer: Managed Care, Other (non HMO) | Admitting: Family Medicine

## 2014-09-16 VITALS — BP 116/86 | HR 91 | Temp 98.9°F | Ht 62.0 in | Wt 206.5 lb

## 2014-09-16 DIAGNOSIS — E119 Type 2 diabetes mellitus without complications: Secondary | ICD-10-CM

## 2014-09-16 DIAGNOSIS — I7300 Raynaud's syndrome without gangrene: Secondary | ICD-10-CM

## 2014-09-16 DIAGNOSIS — I10 Essential (primary) hypertension: Secondary | ICD-10-CM

## 2014-09-16 DIAGNOSIS — Z23 Encounter for immunization: Secondary | ICD-10-CM

## 2014-09-16 DIAGNOSIS — D508 Other iron deficiency anemias: Secondary | ICD-10-CM

## 2014-09-16 MED ORDER — DILTIAZEM HCL ER COATED BEADS 120 MG PO TB24: 120.0000 mg | ORAL_TABLET | Freq: Every day | ORAL | Status: DC

## 2014-09-16 NOTE — Assessment & Plan Note (Signed)
Lab Results  Component Value Date   HGBA1C 6.4 09/09/2014    This is improved Commended on new /better effort for diet/exercise and wt loss F/u 6 mo

## 2014-09-16 NOTE — Assessment & Plan Note (Signed)
bp in fair control at this time  BP Readings from Last 1 Encounters:  09/16/14 116/86   No changes needed Disc lifstyle change with low sodium diet and exercise  Labs reviewed

## 2014-09-16 NOTE — Patient Instructions (Signed)
A1C is improved  Keep working on diet and exercise  On the days when you need cardizem for Raynaud's please hold your metoprolol Anemia is improved  Continue your iron to build up iron stores  Follow up in 6 months with labs prior

## 2014-09-16 NOTE — Assessment & Plan Note (Signed)
Improved with current iron dosing  Hb is in the normal range-will work on inc ferritin/ stores  This is chronic  Will continue to follow

## 2014-09-16 NOTE — Assessment & Plan Note (Signed)
Pt given px for cardizem CD 120 mg to use on days when symptoms are worse On the days she uses it will hold her beta blocker

## 2014-09-16 NOTE — Progress Notes (Signed)
Subjective:    Patient ID: Tina Berger, female    DOB: 04-16-1951, 63 y.o.   MRN: [id number]  HPI Here for f/u of chronic health problems   Is taking iron and vit D and has more energy   Anemia iron def  Lab Results  Component Value Date   WBC 6.6 09/09/2014   HGB 12.2 09/09/2014   HCT 38.5 09/09/2014   MCV 83.0 09/09/2014   PLT 261.0 09/09/2014   taking 325 mg ferrous sulfate   More energy More activity  Now A1c is down to 6.4 from 6.8   ifob card ok   Weight is stable   BP Readings from Last 3 Encounters:  09/16/14 116/86  06/14/14 134/82  05/31/14 124/88     Her raynauds gets worse in the cold weather She takes cardizem when she needs it   Patient Active Problem List   Diagnosis Date Noted  . Sore throat 06/16/2014  . DM type 2 (diabetes mellitus, type 2) 06/14/2014  . Encounter for routine gynecological examination 06/14/2014  . Colon cancer screening 06/14/2014  . Acute renal insufficiency 05/31/2014  . Bilateral lower extremity edema   . Cough 08/08/2012  . Steroid long-term use 06/23/2012  . Post-menopausal 06/23/2012  . Interstitial lung disease 03/21/2012  . Elevated liver enzymes 03/21/2012  . Rhinitis 03/21/2012  . Pedal edema 01/28/2012  . Degenerative disk disease 11/19/2011  . Lumbar disc disease with radiculopathy 11/18/2011  . Other screening mammogram 08/18/2011  . HTN (hypertension) 07/30/2011  . Raynaud disease 07/30/2011  . Obesity 07/30/2011  . Drug rash 05/22/2011  . Hyperkalemia 04/07/2011  . Routine general medical examination at a health care facility 03/28/2011  . PALPITATIONS 04/27/2010  . PERSONAL HISTORY ALLERGY UNSPEC MEDICINAL AGENT 03/18/2010  . SOMATIC DYSFUNCTION 12/11/2009  . DEPRESSION 06/14/2008  . Hyperglycemia 05/13/2008  . UNSPECIFIED VITAMIN D DEFICIENCY 05/13/2008  . ANXIETY 03/25/2008  . OSTEOPENIA 03/25/2008  . HYPOTHYROIDISM 03/28/2007  . HYPERLIPIDEMIA 03/28/2007  . Other iron deficiency anemias  03/28/2007  . PANIC ATTACK 03/28/2007  . KERATOCONJUNCTIVITIS SICCA 03/28/2007  . MITRAL VALVE PROLAPSE 03/28/2007  . ABNORMAL HEART RHYTHMS 03/28/2007  . RAYNAUD'S SYNDROME 03/28/2007  . ESOPHAGITIS 03/28/2007  . GERD 03/28/2007  . GASTRITIS 03/28/2007  . IBS 03/28/2007  . ROSACEA 03/28/2007  . OSTEOARTHRITIS 03/28/2007  . BACK PAIN 03/28/2007  . PLANTAR FASCIITIS 03/28/2007  . FIBROMYALGIA 03/28/2007  . MIGRAINES, HX OF 03/28/2007   Past Medical History  Diagnosis Date  . GERD (gastroesophageal reflux disease)   . HLD (hyperlipidemia)   . HTN (hypertension)   . Hypothyroidism   . Osteoarthritis     hands  . Anemia, iron deficiency   . Diabetes mellitus type II     Diet controlled  . Edema   . Raynaud's disease   . Fibromyalgia   . Dry eyes   . Recurrent HSV (herpes simplex virus)     lesions in nose  . HNP (herniated nucleus pulposus) 2/99    T6,7,8 with DJD  . DJD (degenerative joint disease)   . Cervical dysplasia     abnormal paps  . Left ovarian cyst     x 3, rupture  . Osteopenia     mild-11/01; improved 12/05  . Diverticulosis   . Hemorrhoids     external  . Interstitial lung disease     ?  . Palpitations   . Bilateral lower extremity edema     a. uses torsemide   Past  Surgical History  Procedure Laterality Date  . Hand surgery      left thumb  . Colonoscopy  11/01    Diverticulosis; hemorrhoids  . Lasik      bilateral   History  Substance Use Topics  . Smoking status: Former Smoker    Types: Cigarettes  . Smokeless tobacco: Never Used     Comment: quit over 40 years  . Alcohol Use: No   Family History  Problem Relation Age of Onset  . Emphysema Father     + smoker  . Lung cancer Mother     + smoker  . Coronary artery disease Mother     relatively young  . Lymphoma Brother   . Lymphoma Sister   . Diabetes Brother   . Diabetes Sister   . Heart disease Brother   . Anemia Brother     aplactic    Allergies  Allergen Reactions    . Keflex [Cephalexin] Anaphylaxis  . Penicillins Anaphylaxis  . Amitriptyline Hcl     REACTION: sedating  . Atorvastatin     REACTION: muscle  . Benicar [Olmesartan Medoxomil]     Muscle pain   . Ceftin [Cefuroxime]     Swelling, "legs turn blue"  . Cetirizine Hcl     REACTION: headache  . Ciprofloxacin     REACTION: ? rash vs sun rxn  . Clonidine Derivatives     Swelling   . Diltiazem Hcl     REACTION: reaction not known  . Diovan [Valsartan]     Thought it made her feel confused  . Erythromycin     Rash, swollen gums   . Etodolac     REACTION: reaction not known  . Fluoxetine Hcl     REACTION: stomach problems  . Furosemide     REACTION: swelling  . Gabapentin     REACTION: edema  . Naproxen Sodium     REACTION: edema  . Paroxetine     REACTION: weight gain  . Pregabalin     REACTION: swelling  . Sulfonamide Derivatives     Rash, swollen gums, lips  . Tetracycline     REACTION:inflammed genitals  . Venlafaxine     REACTION: sweating  . Cephalexin Hives and Rash   Current Outpatient Prescriptions on File Prior to Visit  Medication Sig Dispense Refill  . buPROPion (WELLBUTRIN XL) 150 MG 24 hr tablet TAKE 1 TABLET DAILY 90 tablet 3  . Chlorphen-Phenyleph-ASA (ALKA-SELTZER PLUS COLD PO) Take by mouth as needed.      . cyclobenzaprine (FLEXERIL) 10 MG tablet TAKE 1/2 TO 1 TAB BY MOUTH ONCE DAILY AND 1 TAB NIGHTLY AT BEDTIME 180 tablet 1  . diclofenac sodium (VOLTAREN) 1 % GEL Apply 4 g topically 3 (three) times daily as needed. 3 Tube 3  . DIGESTIVE ENZYMES PO Take by mouth daily.     . diphenhydrAMINE (BENADRYL) 25 mg capsule Take 25 mg by mouth as needed.      . DULoxetine (CYMBALTA) 60 MG capsule TAKE 1 CAPSULE DAILY 90 capsule 3  . esomeprazole (NEXIUM) 20 MG capsule TAKE 1 CAPSULE TWICE A DAY 180 capsule 0  . glucose blood test strip One Touch Ultra stripts blue-To check sugar once daily and as needed for DM2 250.00 100 each 3  . GUAIFENESIN CR PO Take by  mouth as needed.      . hyoscyamine (LEVSIN SL) 0.125 MG SL tablet Take 1 tablet (0.125 mg total) by mouth every 4 (  four) hours as needed for cramping. 270 tablet 0  . KRILL OIL PO Take by mouth daily.    Marland Kitchen levofloxacin (LEVAQUIN) 500 MG tablet Take 500 mg by mouth as needed.    Marland Kitchen levothyroxine (SYNTHROID, LEVOTHROID) 150 MCG tablet TAKE 1 TABLET DAILY 90 tablet 0  . Magnesium 400 MG CAPS Take by mouth daily.     . metFORMIN (GLUCOPHAGE) 500 MG tablet TAKE 1 TABLET TWICE A DAY WITH MEALS 180 tablet 0  . metoprolol succinate (TOPROL-XL) 50 MG 24 hr tablet Take 1 tablet (50 mg total) by mouth 2 (two) times daily. 180 tablet 3  . NASONEX 50 MCG/ACT nasal spray USE 2 SPRAYS IN NOSTIL(S) DAILY AS DIRECTED 51 g 1  . oxyCODONE (OXYCONTIN) 10 MG 12 hr tablet Take 10 mg by mouth 2 (two) times daily.     Marland Kitchen oxymorphone (OPANA ER) 20 MG 12 hr tablet Take 20 mg by mouth every 12 (twelve) hours.    . ramipril (ALTACE) 5 MG capsule TAKE 1 CAPSULE DAILY 90 capsule 0  . ranitidine (ZANTAC) 300 MG tablet TAKE 1 TABLET AT BEDTIME 90 tablet 1  . torsemide (DEMADEX) 20 MG tablet Take 1 tablet (20 mg total) by mouth 2 (two) times daily. 180 tablet 3   No current facility-administered medications on file prior to visit.     Review of Systems Review of Systems  Constitutional: Negative for fever, appetite change,  and unexpected weight change. pos for fatigue that is improved Eyes: Negative for pain and visual disturbance.  Respiratory: Negative for cough and shortness of breath.   Cardiovascular: Negative for cp or palpitations    Gastrointestinal: Negative for nausea, diarrhea and constipation. neg for blood in stool or abdominal pain  Genitourinary: Negative for urgency and frequency.  Skin: Negative for pallor or rash  pos for purple discoloration of feet with raynaud's  Neurological: Negative for weakness, light-headedness, numbness and headaches.  Hematological: Negative for adenopathy. Does not  bruise/bleed easily.  Psychiatric/Behavioral: Negative for dysphoric mood. The patient is not nervous/anxious.         Objective:   Physical Exam  Constitutional: She appears well-developed and well-nourished. No distress.  HENT:  Head: Normocephalic and atraumatic.  Mouth/Throat: Oropharynx is clear and moist.  Eyes: Conjunctivae and EOM are normal. Pupils are equal, round, and reactive to light. No scleral icterus.  Neck: Normal range of motion. Neck supple. No JVD present. Carotid bruit is not present. No thyromegaly present.  Cardiovascular: Normal rate, regular rhythm and intact distal pulses.  Exam reveals no gallop.   Pulmonary/Chest: Effort normal and breath sounds normal. No respiratory distress. She has no wheezes. She has no rales.  Musculoskeletal: She exhibits no edema.  Lymphadenopathy:    She has no cervical adenopathy.  Neurological: She is alert. She has normal reflexes. No cranial nerve deficit. She exhibits normal muscle tone. Coordination normal.  Skin: Skin is warm and dry. No rash noted. No erythema. No pallor.  Baseline purple discoloration of feet- that are cool but with palpable pulses   Psychiatric: She has a normal mood and affect.          Assessment & Plan:   Problem List Items Addressed This Visit      Cardiovascular and Mediastinum   HTN (hypertension) - Primary    bp in fair control at this time  BP Readings from Last 1 Encounters:  09/16/14 116/86   No changes needed Disc lifstyle change with low sodium diet and  exercise  Labs reviewed     Relevant Medications      diltiazem (CARDIZEM LA) 120 MG 24 hr tablet   RAYNAUD'S SYNDROME    Pt given px for cardizem CD 120 mg to use on days when symptoms are worse On the days she uses it will hold her beta blocker      Relevant Medications      diltiazem (CARDIZEM LA) 120 MG 24 hr tablet     Endocrine   DM type 2 (diabetes mellitus, type 2)    Lab Results  Component Value Date   HGBA1C  6.4 09/09/2014    This is improved Commended on new /better effort for diet/exercise and wt loss F/u 6 mo       Other   Other iron deficiency anemias    Improved with current iron dosing  Hb is in the normal range-will work on [employer] ferritin/ stores  This is chronic  Will continue to follow      Other Visit Diagnoses    Need for zoster vaccination        Relevant Orders       Varicella-zoster vaccine subcutaneous (Completed)

## 2014-09-16 NOTE — Progress Notes (Signed)
Pre visit review using our clinic review tool, if applicable. No additional management support is needed unless otherwise documented below in the visit note. 

## 2014-09-17 ENCOUNTER — Telehealth: Payer: Self-pay | Admitting: Family Medicine

## 2014-09-17 NOTE — Telephone Encounter (Signed)
emmi emailed °

## 2014-10-15 ENCOUNTER — Other Ambulatory Visit: Payer: Self-pay | Admitting: Family Medicine

## 2014-11-24 ENCOUNTER — Other Ambulatory Visit: Payer: Self-pay | Admitting: Family Medicine

## 2014-11-26 ENCOUNTER — Other Ambulatory Visit: Payer: Self-pay | Admitting: Family Medicine

## 2014-12-27 ENCOUNTER — Other Ambulatory Visit: Payer: Self-pay | Admitting: Family Medicine

## 2015-02-04 ENCOUNTER — Ambulatory Visit
Admit: 2015-02-04 | Disposition: A | Discharge status: Home / Self Care | Payer: Self-pay | Attending: Family Medicine | Admitting: Family Medicine

## 2015-02-09 NOTE — H&P (Signed)
PATIENT NAME:  Tina Berger, Tina Berger MR#:  [id number] DATE OF BIRTH:  [birth year]  DATE OF ADMISSION:  03/03/2012  PRIMARY CARE PHYSICIAN: Dr. Loura Pardon   CHIEF COMPLAINT: Increased shortness of breath.   HISTORY OF PRESENT ILLNESS: Tina Berger is a 64 year old pleasant Caucasian female with past medical history of fibromyalgia and hypothyroidism. The patient was in her usual state of health until about two days ago, on 05/04 when she was at the municipal pool at which she was exposed to chlorine chemical substance. Patient reports that she stayed there for about two hours during which she had instant headache and eye irritation. The patient thought that she will get better, however, the second day she had breathing difficulty and it was hurting when she takes a deep breath. She had mild sore throat and sinuses were raw and painful. Later she went to the wellness center at her work. They found that she had pharyngitis with red throat and she was given Levaquin. Today she went to her spinal specialist at [facility] to evaluate her degenerative joint disease of her spine. She noticed that she is short of breath upon exertion. They checked her oxygen saturation on room air and it was 85%. They discharged the patient and asked her to have chest x-ray and to contact her primary care physician with results of the chest x-ray. She was later instructed by the primary care physician to go to the Emergency Room due to findings of chest x-ray being abnormal. Here at the Emergency Department the patient had CAT scan of the chest which showed patchy interstitial infiltrates with subtle nodularity diffusely within both lungs. The findings are worrisome for pneumonitis. Patient was admitted for further evaluation and treatment.   PAST MEDICAL HISTORY:  1. History of fibromyalgia.  2. History of Raynaud's phenomena. 3. Hypothyroidism. 4. Diabetes mellitus type 2 on diet. 5. History of heart murmur.  6. History of gastroesophageal  reflux disease.  7. Recurrent urinary tract infection, placed on chronic antibiotic use of nitrofurantoin or Macrobid.    PAST SURGICAL HISTORY:  1. History of cervical laminectomy at C5-C6.  2. History of thumb joint repair.  3. History of laparoscopic surgery for ovarian cyst. 4. LASIK eye surgery.    REVIEW OF SYSTEMS: CONSTITUTIONAL: Denies any fever. No chills. No fatigue other than her chronic fibromyalgia symptoms. EYES: She had irritation of the eyes during the exposure to chlorine. No double vision. No blurring of vision. ENT: Reports that her sinuses are painful. Mild sore throat. No hearing impairment. No dysphagia. CARDIOVASCULAR: Reports exertional shortness of breath since the exposure to chlorine. No chest pain other than mild pain upon deep breathing. No syncope. RESPIRATORY: Mild dry cough and exertional shortness of breath. No sputum production. No hemoptysis. GASTROINTESTINAL: No abdominal pain. No vomiting. No diarrhea. GENITOURINARY: No dysuria. No frequency of urination. MUSCULOSKELETAL: No joint pain or swelling. No muscular pain or swelling. INTEGUMENTARY: No skin rash. No ulcers. NEUROLOGIC: No focal weakness. No seizure activity. PSYCHIATRY: No anxiety or depression. ENDOCRINE: No polyuria or polydipsia. No heat or cold intolerance.   SOCIAL HABITS: Nonsmoker. No history of alcoholism or other drug abuse.   SOCIAL HISTORY: She is married, living with her husband. She works as an Web designer at [employer].   FAMILY HISTORY: Negative for pulmonary disease.   ADMISSION MEDICATIONS:  1. Ramipril 5 mg a day. 2. Bupropion XL 150 mg once a day.  3. Cymbalta 60 mg a day.  4. Valsartan with hydrochlorothiazide 160/25  once a day.  5. Nexium 40 mg twice a day. 6. Metoprolol succinate 50 mg once a day. 7. Hydrocodone with acetaminophen 5/500 p.r.n. for pain.  8. Nitrofurantoin 100 mg once a day or Macrobid.  9. Levothyroxine 125 mcg once a day.   10. Flexeril 10 mg at night; she takes 1/2 tablet in the morning and sometimes through the day as well.    ALLERGIES: She is allergic to penicillin and Ceftin causing hives. Tetracycline causing vulvar inflammation and swelling. Sulfa and erythromycin causing swelling of lips.   PHYSICAL EXAMINATION: VITAL SIGNS: Blood pressure 95/58, respiratory rate 18, pulse 82, temperature 98.6, oxygen saturation on oxygen is 94%.   GENERAL APPEARANCE: Elderly lady lying in bed in no acute distress.   HEAD AND NECK EXAMINATION: No pallor. No icterus. No cyanosis.   ENT: Hearing was normal. Nasal mucosa, lips, tongue were normal. The posterior pharyngeal area is red but no exudates.   EYES: Normal eyelids. The conjunctivae both are congested. Pupils about 10 mm, equal and reactive to light.   NECK: Supple. Trachea at midline. No thyromegaly. No cervical lymphadenopathy. No masses.   HEART: Normal S1, S2. No S3, S4. No murmur. No gallop. No carotid bruits.   RESPIRATORY: Normal breathing pattern. There are scattered fine crackles in both lungs all over the lungs in all zones.   ABDOMEN: Soft without tenderness. No hepatosplenomegaly. No masses. No hernias.   SKIN: No ulcers. No subcutaneous nodules.   MUSCULOSKELETAL: No joint swelling. No clubbing.   NEUROLOGIC: Cranial nerves II through XII were intact. No focal motor deficit.   PSYCHIATRIC: Patient is alert, oriented x3. Mood and affect were normal.   LABORATORY, DIAGNOSTIC, AND RADIOLOGICAL DATA: EKG showed normal sinus rhythm at rate of 89 per minutes, poor progression of R waves in the anterior chest leads, otherwise unremarkable EKG. CAT scan of the chest revealed patchy interstitial infiltrates with subtle nodularity diffusely within both lungs. Serum glucose 107, BUN 24, creatinine 0.9, sodium 131, potassium 4.3, total protein 7.5, albumin 3.1, bilirubin normal at 0.4, AST elevated 201, ALT is up 298. Alkaline phosphatase is normal.  Troponin normal, less than 0.02. CBC showed white count 9000, hemoglobin 12, hematocrit 38, platelet count 442. D-dimer 0.8. ABG showed pH 7.42, pCO2 39, pO2 low at 45 and that was on room air.   ASSESSMENT:  1. Bilateral interstitial patchy infiltrates consistent with pneumonitis likely secondary to the exposure to chlorine.  2. Hypoxemia with a pO2 of 45 secondary to the above and acute respiratory failure secondary to pneumonitis..  3. Elevated liver enzymes, etiology is unclear.  4. Mild hyponatremia.  5. Fibromyalgia.  6. Raynaud's phenomena by history. 7. Hypothyroidism on replacement therapy.  8. Diabetes mellitus, type 2 on diet.  9. Gastroesophageal reflux disease. 10. Recurrent urinary tract infection maintained on Macrobid antibiotic chronic treatment.   PLAN: Will admit the patient to the medical floor. Blood cultures x2 were taken. Antibiotic initiated using Levaquin. Since she did not respond to oral Levaquin as outpatient will add vancomycin. The patient is allergic to penicillin and cephalosporin and that limits choices of antibiotics. Although the validity of using antibiotic in this case is not certain as we might be dealing with only chemical pneumonitis rather than infection, however, I am not certain. Even though the presentation is more consistent with exposure to chlorine resulting in pneumonitis, however, I will discontinue Macrobid or nitrofurantoin as there is potential pulmonary toxicity and pneumonitis associated with this antibiotic as well and  I cannot rule out underlying chronic inflammation that could have exacerbated by the chlorine chemical exposure. I consulted pulmonary for further input. I placed the patient on IV steroids. I will follow up on the sodium level and also the liver enzymes. Will repeat liver function tests and basic metabolic profile tomorrow. Regarding a LIVING WILL the patient admits that she has a LIVING WILL but no details were explained, however,  she gave the power of attorney to her husband.   TIME SPENT EVALUATING THIS PATIENT: Took more than one hour.   ____________________________ Clovis Pu. Lenore Manner, MD amd:cms D: 03/03/2012 01:36:05 ET T: 03/03/2012 07:17:18 ET  JOB#: [id number] cc: Clovis Pu. Lenore Manner, MD, <Dictator> Marne A. Glori Bickers, MD Clovis Pu Cathalina Barcia MD ELECTRONICALLY SIGNED 03/03/2012 22:32

## 2015-02-09 NOTE — Consult Note (Signed)
Chief Complaint:   Subjective/Chief Complaint she was feeling better but when steroids decreased she noted some more SOB. patient now back on higher dose of steroids and on oxygen   VITAL SIGNS/ANCILLARY NOTES: **Vital Signs.:   20-May-13 06:51   Vital Signs Type Routine   Temperature Temperature (F) 98   Celsius 36.6   Temperature Source oral   Pulse Pulse 83   Pulse source per Dinamap   Respirations Respirations 20   Systolic BP Systolic BP 616   Diastolic BP (mmHg) Diastolic BP (mmHg) 81   Mean BP 96   BP Source Dinamap   Pulse Ox % Pulse Ox % 95   Pulse Ox Activity Level  At rest   Oxygen Delivery 1L; Nasal Cannula  *Intake and Output.:   Daily 20-May-13 07:00   Grand Totals Intake:  480 Output:      Net:  480 24 Hr.:  480   Oral Intake      In:  480   Length of Stay Totals Intake:  2490 Output:      Net:  0737   Brief Assessment:   Cardiac Regular  -- thrills  -- LE edema  --Gallop    Respiratory normal resp effort  no use of accessory muscles  crackles    Gastrointestinal details normal Soft  Nontender  Bowel sounds normal  No rigidity   Routine Chem:  20-May-13 05:46    Glucose, Serum 138   BUN 21   Creatinine (comp) 0.79   Sodium, Serum 135   Potassium, Serum 4.9   Chloride, Serum 99   CO2, Serum 28   Calcium (Total), Serum 8.4  Hepatic:  20-May-13 05:46    Bilirubin, Total 0.3   Alkaline Phosphatase 94   SGPT (ALT) 143   SGOT (AST) 36   Total Protein, Serum 6.7   Albumin, Serum 2.9  Routine Chem:  20-May-13 05:46    Osmolality (calc) 275   eGFR (African American) >60   eGFR (Non-African American) >60   Anion Gap 8   Assessment/Plan:  Assessment/Plan:   Assessment 1. ILD -will stay on higher dose of steroids -will see back in office after discharge and possibly may need bronch or biopsy   Electronic Signatures: Allyne Gee (MD)  (Signed 20-May-13 13:37)  Authored: Chief Complaint, VITAL SIGNS/ANCILLARY NOTES, Brief Assessment, Lab  Results, Assessment/Plan   Last Updated: 20-May-13 13:37 by Allyne Gee (MD)

## 2015-02-09 NOTE — Consult Note (Signed)
PATIENT NAME:  Tina Berger, BURICH MR#:  [id number] DATE OF BIRTH:  [birth year]  DATE OF CONSULTATION:  03/03/2012  CONSULTING PHYSICIAN:  Allyne Gee, MD  REASON FOR CONSULTATION:  The patient is a 64 year old female who has a past medical history significant for hypothyroidism. The patient presented to the Emergency Room because she was noted to be hypoxic. She apparently two days ago was experiencing some sore throat and some sinus trouble after being exposed to chlorine at a swimming pool. The patient had been also complaining about having some sinus problems. She was initially thought to have some pharyngitis and was treated with outpatient antibiotic, Levaquin. The patient was noted at routine checkup to have low saturations of about 85%. The patient had a chest x-ray done which was abnormal, and she was referred to the Emergency Room. In the Emergency Room, they did do a CT scan of the chest and it showed interstitial infiltrates.   PAST MEDICAL HISTORY: Significant for: 1. Raynaud's.  2. Hypothyroidism. 3. Diabetes. 4. Gastroesophageal reflux disease.  5. Urinary tract infection. 6. Fibromyalgia.    PAST SURGICAL HISTORY: Cervical laminectomy. She has had laparoscopic surgery in the past.   REVIEW OF SYSTEMS: A 12-point review of systems was performed. Negative for any fevers or chills. She does have the chronic pain, though.  HEENT: Negative other than the sinuses noted above and the sore throat. RESPIRATORY: Positive for cough and some shortness of breath and pain in her chest. CARDIOVASCULAR: Positive for pain in her chest with breathing. GASTROINTESTINAL: Negative for any nausea, vomiting, or diarrhea. GENITOURINARY: Negative for hematuria. MUSCULOSKELETAL: No active synovitis. SKIN: No acute rashes. PSYCHIATRIC: No depression. NEUROLOGICAL: No focal deficits. ENDOCRINE: Negative for polydipsia.    SOCIAL HISTORY: She does not smoke. No alcohol or drug use. She works at [employer].    MEDICATIONS: Reviewed on the Electronic Medical Record.  ALLERGIES: Penicillin and Ceftin. She also has a history of tetracycline and erythromycin as well as sulfa.   PHYSICAL EXAMINATION:  GENERAL: At the time that she was seen, she was awake and appeared comfortable without any distress.   VITAL SIGNS: Temperature 97.9, pulse 77, respiratory rate 18, blood pressure 105/70, saturations 94%.   NECK: Neck appeared to be supple. There was no JVD. No adenopathy. No thyromegaly.   CHEST: Coarse breath sounds bilaterally. No rales or rhonchi. Expansion was equal.   CARDIOVASCULAR: S1, S2 was normal. Regular rhythm. No gallop or rub.   NEUROLOGIC: The patient was awake and moving all four extremities.   SKIN: No acute rashes.   MUSCULOSKELETAL: Without any active synovitis.   ABDOMEN: Soft and nontender. No hepatosplenomegaly was noted.   LABORATORY, DIAGNOSTIC AND RADIOLOGICAL DATA:  White count was 9.8, hemoglobin 12.2, hematocrit 38.2.  The patient's chemistries showed BUN 24, creatinine 0.93, sodium 131, potassium 4.3. The patient's SGOT was elevated at 201. Albumin was low at 3.1.  Troponins were negative.  CT scan of the chest shows a patchy infiltrates which are interstitial in both lungs. The patient was felt to have some underlying bronchiectasis, and there was also concern for a possibility of emboli. Some borderline enlarged lymph nodes were noted.   IMPRESSION:  1. Acute pneumonia, interstitial type.  2. Interstitial infiltrates.   PLAN: She does not have, really, an elevated white count currently. She has been started on antibiotics. She has been given vancomycin and Levaquin and then also has been started on steroids, with which I agree because this could be an  interstitial pneumonia or maybe an acute hypersensitivity-type pneumonia. The patient's cultures that were done have been negative so far. I would suggest doing a repeat x-ray in a couple of days. Monitor her ABGs  and continue with steroids for now. Further recommendations as warranted.   ____________________________ Allyne Gee, MD sak:cbb D: 03/03/2012 11:28:30 ET T: 03/03/2012 12:01:23 ET JOB#: [id number]  cc: Allyne Gee, MD, <Dictator> Allyne Gee MD ELECTRONICALLY SIGNED 04/11/2012 15:51

## 2015-02-09 NOTE — Discharge Summary (Signed)
PATIENT NAME:  Tina Berger, Tina Berger MR#:  [id number] DATE OF BIRTH:  12-27-1950  DATE OF ADMISSION:  03/03/2012 DATE OF DISCHARGE:  03/07/2012  PRIMARY CARE PHYSICIAN: Loura Pardon, MD   FINAL DIAGNOSES:  1. Acute respiratory failure and hypoxia, now requiring home oxygen.  2. Probable interstitial lung disease with interstitial pneumonitis. Whether this was a chemical exposure or drug exposure or underlying interstitial lung disease unclear at this point.  3. Hypothyroidism.  4. Hypertension.  5. Fibromyalgia.  6. Gastroesophageal reflux disease.  7. Hyponatremia.  8. Increased liver function tests.  9. Chronic pain.  10. Diabetes.   MEDICATIONS ON DISCHARGE:  1. Wellbutrin 150 mg every 24 hours. 2. Ramipril 5 mg daily.  3. Cymbalta 60 mg daily.  4. Nexium 40 mg daily.  5. Metoprolol ER 50 mg daily.  6. Maxalt 10 mg p.r.n.  7. Nasonex 50 mcg inhalation two sprays daily.  8. Hyoscyamine 0.125 mg tablet every four hours as needed.  9. Levothyroxine 125 mcg daily.  10. Cyclobenzaprine 1 tablet twice a day 10 mg. 11. Diovan 80 mg p.o. daily.  12. MiraLAX 17 grams p.o. daily as needed for constipation. 13. Allegra 180 mg p.o. daily.  14. Oxycodone 5 mg every four hours as needed for pain. 15. MS Contin 15 mg p.o. twice a day. 16. Levaquin 500 mg p.o. daily until completion.  17. Prednisone 20 mg 3 tablets daily.  18. Glucophage 500 mg p.o. twice a day. 19. Albuterol inhaler 2 puffs every six hours as needed for shortness of breath.   DO NOT TAKE: Nitrofurantoin.   DIET: Low sodium, 1800 ADA diet.   ACTIVITY: Activity as tolerated.   FOLLOW-UP:  1. Follow-up with Dr. Humphrey Rolls, Pulmonary, in one week. 2. Follow-up in two weeks with Dr. Loura Pardon.   REASON FOR ADMISSION: The patient was admitted 03/03/2012 and discharged 03/07/2012. The patient came in with increasing shortness of breath.   HISTORY OF PRESENT ILLNESS: The patient is a 64 year old female with history of  fibromyalgia. Two days ago she was exposed to chlorine, had an instant headache, eye irritation, difficulty breathing. She went to the [facility] They told her that she had a pharyngitis and she was given Levaquin. At her spine specialist they found that her pulse oximetry was low and asked her primary care physician to do a chest x-ray and then was referred to the ER for abnormal chest x-ray. In the ER had a CT scan of the chest that showed patchy interstitial infiltrates and hospitalist services were contacted for admission for acute respiratory failure, interstitial infiltrates, and elevated liver enzymes. She was initially started on IV Levaquin, vancomycin, and IV Solu-Medrol. Pulmonary consultation was done by Dr. Devona Konig.   LABORATORY AND RADIOLOGICAL DATA DURING THE HOSPITAL COURSE: Chest x-ray showed patchy bilateral thickening of lung markings compatible with patchy areas of pneumonia more prominent on the left.   Troponin negative. White blood cell count 9.8, hemoglobin and hematocrit 12.2 and 38.2, platelet count 442, glucose 107, BUN 24, creatinine 0.93, sodium 131, potassium 4.3, chloride 97, CO2 24, calcium 8.2, ALT 298, AST 201, albumin 3.1, alkaline phosphatase 112, bilirubin 0.4.   EKG showed normal sinus rhythm, poor R wave progression.   D-dimer elevated at 0.83.   CT scan of the chest showed patchy interstitial infiltrates, subtle nodularity both lungs, underlying bronchiectasis, few borderline enlarged mediastinal and hilar nodes.   Blood cultures no growth. ABG showed pH 7.42, pCO2 39, pO2 45, bicarb 25.3, oxygen  saturation 86.6 on room air. Hepatitis profile I showed Hepatitis A antibody IgM negative. Hepatitis surface antigen screen negative. Hep B core antibody IgM negative.   Ultrasound of the abdomen showed echotexture of the liver is heterogeneous. Mild dilation of the common bile duct to 8.3 mm. Gallbladder is normal appearance.   Pulse oximetry room air 85%.  Pulse oximetry on room air after ambulation 80. Pulse oximetry on 1 liter at rest 94. Last liver function tests included an ALT of 143, AST 36.   HOSPITAL COURSE PER PROBLEM LIST:  1. For the patient's acute respiratory failure and hypoxia, the patient does require oxygen. This is a 24/7 basis at this point in time.  2. Probable interstitial lung disease. Whether this is an interstitial pneumonitis secondary to infection unclear. Unclear whether this is a chemical exposure or secondary to [place]. Macrobid was stopped. The patient was started on high dose Solu-Medrol, tapered down to medium dose Solu-Medrol, and switched over to prednisone 60 mg daily basis until seen by Dr. Humphrey Rolls in one week. Further recommendations at that point from Dr. Humphrey Rolls based on clinical course. Most likely will end up needing a bronchoscopy and/or possibility of open lung biopsy for formal diagnosis. The patient will finish up the course of Levaquin. The patient was initially started on Levaquin and vancomycin. Due to multiple drug allergies, unable to have much choice on antibiotics.  3. Hypothyroidism. She is kept on her levothyroxine.  4. For her hypertension, blood pressure currently stable, upon discharge 123/72.  5. Fibromyalgia. She is on numerous psychiatric medications.  6. Gastroesophageal reflux disease. She is on Nexium.  7. Hyponatremia. That resolved.  8. Increased liver function tests, trending better. Ultrasound of the liver unremarkable. The Hepatitis profile set one only contains Hepatitis A and Hepatitis B. With repeat liver function tests as outpatient, may consider checking Hepatitis C.  9. For her chronic pain, she was started on MS Contin and oxycodone and her pain is much improved. Continue to follow-up with pain management.  10. For her diabetes with being on prednisone, her sugars are high in the high 100's and 200 range. She was started on Glucophage upon going home. She does have a glucometer.    TIME SPENT ON DISCHARGE: 35 minutes.   ____________________________ Tana Conch. Leslye Peer, MD rjw:drc D: 03/07/2012 16:23:59 ET T: 03/08/2012 10:28:41 ET JOB#: [id number]  cc: Tana Conch. Leslye Peer, MD, <Dictator> Warren Glori Bickers, MD Allyne Gee, MD Marisue Brooklyn MD ELECTRONICALLY SIGNED 03/09/2012 13:14

## 2015-02-18 ENCOUNTER — Other Ambulatory Visit: Payer: Self-pay | Admitting: Family Medicine

## 2015-02-18 ENCOUNTER — Encounter: Payer: Self-pay | Admitting: Family Medicine

## 2015-02-18 NOTE — Telephone Encounter (Signed)
done

## 2015-02-18 NOTE — Telephone Encounter (Signed)
Please refill for 6 mo 

## 2015-02-18 NOTE — Telephone Encounter (Signed)
Electronic refill request. Last Filled:    180 tablet 1 RF on 07/29/2014  Please advise.

## 2015-02-19 ENCOUNTER — Telehealth: Payer: Self-pay | Admitting: Family Medicine

## 2015-02-19 MED ORDER — DICLOFENAC SODIUM 1 % TD GEL: 4.0000 g | Freq: Three times a day (TID) | TRANSDERMAL | Status: DC | PRN

## 2015-02-19 NOTE — Telephone Encounter (Signed)
Sent diclofenac gel generic to exp px

## 2015-03-12 ENCOUNTER — Other Ambulatory Visit: Payer: Self-pay | Admitting: Family Medicine

## 2015-03-13 ENCOUNTER — Other Ambulatory Visit: Payer: Self-pay | Admitting: Family Medicine

## 2015-04-09 ENCOUNTER — Other Ambulatory Visit: Payer: Self-pay | Admitting: Family Medicine

## 2015-04-14 ENCOUNTER — Other Ambulatory Visit: Payer: Self-pay

## 2015-05-12 ENCOUNTER — Other Ambulatory Visit: Payer: Self-pay | Admitting: Family Medicine

## 2015-05-12 NOTE — Telephone Encounter (Signed)
Electronic refill request, last OV was 09/16/14 and no future appt., please advise

## 2015-05-12 NOTE — Telephone Encounter (Signed)
Please schedule f/u and refill until then Thanks  

## 2015-05-13 NOTE — Telephone Encounter (Signed)
appt scheduled and med refilled 

## 2015-06-05 ENCOUNTER — Other Ambulatory Visit: Payer: Self-pay | Admitting: Family Medicine

## 2015-07-07 ENCOUNTER — Ambulatory Visit: Payer: Managed Care, Other (non HMO) | Admitting: Family Medicine

## 2015-08-07 ENCOUNTER — Other Ambulatory Visit: Payer: Self-pay | Admitting: Nurse Practitioner

## 2015-08-13 ENCOUNTER — Ambulatory Visit: Payer: Managed Care, Other (non HMO) | Admitting: Family Medicine

## 2015-08-14 ENCOUNTER — Other Ambulatory Visit: Payer: Self-pay | Admitting: *Deleted

## 2015-08-14 NOTE — Telephone Encounter (Signed)
The solution is fine with me if ok with the patient  Tell them to go ahead and make the change

## 2015-08-14 NOTE — Telephone Encounter (Signed)
[facility] rep said she will call back at 4:00pm, I will give verbal Rx once she calls back

## 2015-08-14 NOTE — Telephone Encounter (Signed)
Tina Berger with [facility] called and advise me that the diclofenac gel 1% is no longer covered but they do cover the diclofenac topical solution 1.5%, Tina Berger said she has no direct extention but she will call back later this afternoon to see if Tina Berger is okay with changing Rx to the diclofenac topical solution 1.5%, if you don't want to change it to the topical solution they also cover ibuprofen all strengths, mobic, and diclofenac tabs 25mg , 50mg  and 75mg , is it okay to change Rx, please advise

## 2015-08-15 MED ORDER — DICLOFENAC SODIUM 1.5 % TD SOLN: 40.0000 [drp] | Freq: Four times a day (QID) | TRANSDERMAL | Status: AC

## 2015-08-15 NOTE — Telephone Encounter (Signed)
[facility]ed [facility] and changed Rx to topical solution standard directions are 40 drops four times daily to affected area---will change in chart to reflect change

## 2015-08-25 ENCOUNTER — Ambulatory Visit (INDEPENDENT_AMBULATORY_CARE_PROVIDER_SITE_OTHER): Payer: Medicare Other | Admitting: Family Medicine

## 2015-08-25 ENCOUNTER — Encounter: Payer: Self-pay | Admitting: Family Medicine

## 2015-08-25 VITALS — BP 122/72 | HR 71 | Temp 99.0°F | Ht 62.0 in | Wt 207.2 lb

## 2015-08-25 DIAGNOSIS — R739 Hyperglycemia, unspecified: Secondary | ICD-10-CM | POA: Diagnosis not present

## 2015-08-25 DIAGNOSIS — I10 Essential (primary) hypertension: Secondary | ICD-10-CM | POA: Diagnosis not present

## 2015-08-25 DIAGNOSIS — E039 Hypothyroidism, unspecified: Secondary | ICD-10-CM | POA: Diagnosis not present

## 2015-08-25 DIAGNOSIS — E119 Type 2 diabetes mellitus without complications: Secondary | ICD-10-CM | POA: Diagnosis not present

## 2015-08-25 DIAGNOSIS — E785 Hyperlipidemia, unspecified: Secondary | ICD-10-CM

## 2015-08-25 DIAGNOSIS — Z23 Encounter for immunization: Secondary | ICD-10-CM

## 2015-08-25 DIAGNOSIS — E669 Obesity, unspecified: Secondary | ICD-10-CM | POA: Diagnosis not present

## 2015-08-25 LAB — LDL CHOLESTEROL, DIRECT: Direct LDL: 136 mg/dL

## 2015-08-25 LAB — COMPREHENSIVE METABOLIC PANEL
ALK PHOS: 95 U/L (ref 39–117)
ALT: 15 U/L (ref 0–35)
AST: 17 U/L (ref 0–37)
Albumin: 4.2 g/dL (ref 3.5–5.2)
BILIRUBIN TOTAL: 0.4 mg/dL (ref 0.2–1.2)
BUN: 21 mg/dL (ref 6–23)
CO2: 36 meq/L — AB (ref 19–32)
Calcium: 9.1 mg/dL (ref 8.4–10.5)
Chloride: 95 mEq/L — ABNORMAL LOW (ref 96–112)
Creatinine, Ser: 0.99 mg/dL (ref 0.40–1.20)
GFR: 59.73 mL/min — ABNORMAL LOW (ref >60.00)
GLUCOSE: 101 mg/dL — AB (ref 70–99)
Potassium: 4.4 mEq/L (ref 3.5–5.1)
SODIUM: 138 meq/L (ref 135–145)
TOTAL PROTEIN: 7.3 g/dL (ref 6.0–8.3)

## 2015-08-25 LAB — HEMOGLOBIN A1C: Hgb A1c MFr Bld: 6.3 % (ref 4.6–6.5)

## 2015-08-25 LAB — CBC WITH DIFFERENTIAL/PLATELET
BASOS ABS: 0 10*3/uL (ref 0.0–0.1)
Basophils Relative: 0.5 % (ref 0.0–3.0)
EOS ABS: 0.2 10*3/uL (ref 0.0–0.7)
Eosinophils Relative: 3.4 % (ref 0.0–5.0)
HCT: 43.5 % (ref 36.0–46.0)
Hemoglobin: 14.2 g/dL (ref 12.0–15.0)
LYMPHS ABS: 2.1 10*3/uL (ref 0.7–4.0)
Lymphocytes Relative: 30.2 % (ref 12.0–46.0)
MCHC: 32.5 g/dL (ref 30.0–36.0)
MCV: 90.2 fl (ref 78.0–100.0)
MONO ABS: 0.7 10*3/uL (ref 0.1–1.0)
MONOS PCT: 10.8 % (ref 3.0–12.0)
NEUTROS PCT: 55.1 % (ref 43.0–77.0)
Neutro Abs: 3.8 10*3/uL (ref 1.4–7.7)
PLATELETS: 336 10*3/uL (ref 150.0–400.0)
RBC: 4.82 Mil/uL (ref 3.87–5.11)
RDW: 13.1 % (ref 11.5–15.5)
WBC: 6.8 10*3/uL (ref 4.0–10.5)

## 2015-08-25 LAB — LIPID PANEL
CHOL/HDL RATIO: 5
Cholesterol: 224 mg/dL — ABNORMAL HIGH (ref 0–200)
HDL: 46.8 mg/dL (ref >39.00)
NONHDL: 176.73
Triglycerides: 201 mg/dL — ABNORMAL HIGH (ref 0.0–149.0)
VLDL: 40.2 mg/dL — AB (ref 0.0–40.0)

## 2015-08-25 LAB — TSH: TSH: 1.82 u[IU]/mL (ref 0.35–4.50)

## 2015-08-25 NOTE — Patient Instructions (Signed)
Take care of yourself  Stay as active as you can be  Blood pressure and weight are stable  Lab today  Shots today

## 2015-08-25 NOTE — Assessment & Plan Note (Signed)
Discussed how this problem influences overall health and the risks it imposes  Reviewed plan for weight loss with lower calorie diet (via better food choices and also portion control or program like weight watchers) and exercise building up to or more than 30 minutes 5 days per week including some aerobic activity   Pt can get a little more exercise now that pain is under better control with pain clinic

## 2015-08-25 NOTE — Progress Notes (Signed)
Subjective:    Patient ID: Tina Berger, female    DOB: 12-30-1950, 65 y.o.   MRN: [id number]  HPI Here for f/u of chronic medical problems   More active lately  Stays very very hot- wonders about thyroid function   bp is stable today  No cp or palpitations or headaches or edema  No side effects to medicines  BP Readings from Last 3 Encounters:  08/25/15 122/72  09/16/14 116/86  06/14/14 134/82     Wt is up 1 lb bmi 37- obese  [facility] lately [facility]   Lab Results  Component Value Date   TSH 3.04 06/11/2014    No rapid heartbeat   Due to check A1C Lab Results  Component Value Date   HGBA1C 6.4 09/09/2014     Cholesterol Lab Results  Component Value Date   CHOL 210* 06/11/2014   HDL 49.40 06/11/2014   LDLCALC 126* 06/11/2014   LDLDIRECT 135.6 10/08/2011   TRIG 174.0* 06/11/2014   CHOLHDL 4 06/11/2014   did not tolerate statin in the past  Controlling with diet the best you can   bp is stable today  No cp or palpitations or headaches or edema  No side effects to medicines  BP Readings from Last 3 Encounters:  08/25/15 122/72  09/16/14 116/86  06/14/14 134/82      Patient Active Problem List   Diagnosis Date Noted  . Sore throat 06/16/2014  . DM type 2 (diabetes mellitus, type 2) ([place]) 06/14/2014  . Encounter for routine gynecological examination 06/14/2014  . Colon cancer screening 06/14/2014  . Acute renal insufficiency 05/31/2014  . Bilateral lower extremity edema   . Cough 08/08/2012  . Steroid long-term use 06/23/2012  . Post-menopausal 06/23/2012  . Interstitial lung disease ([place]) 03/21/2012  . Elevated liver enzymes 03/21/2012  . Rhinitis 03/21/2012  . Pedal edema 01/28/2012  . Degenerative disk disease 11/19/2011  . Lumbar disc disease with radiculopathy 11/18/2011  . Other screening mammogram 08/18/2011  . HTN (hypertension) 07/30/2011  . Raynaud disease 07/30/2011  . Obesity 07/30/2011  . Drug rash  05/22/2011  . Hyperkalemia 04/07/2011  . Routine general medical examination at a health care facility 03/28/2011  . PALPITATIONS 04/27/2010  . PERSONAL HISTORY ALLERGY UNSPEC MEDICINAL AGENT 03/18/2010  . SOMATIC DYSFUNCTION 12/11/2009  . DEPRESSION 06/14/2008  . Hyperglycemia 05/13/2008  . UNSPECIFIED VITAMIN D DEFICIENCY 05/13/2008  . ANXIETY 03/25/2008  . OSTEOPENIA 03/25/2008  . Hypothyroidism 03/28/2007  . Hyperlipidemia 03/28/2007  . Other iron deficiency anemias 03/28/2007  . PANIC ATTACK 03/28/2007  . KERATOCONJUNCTIVITIS SICCA 03/28/2007  . MITRAL VALVE PROLAPSE 03/28/2007  . ABNORMAL HEART RHYTHMS 03/28/2007  . RAYNAUD'S SYNDROME 03/28/2007  . ESOPHAGITIS 03/28/2007  . GERD 03/28/2007  . GASTRITIS 03/28/2007  . IBS 03/28/2007  . ROSACEA 03/28/2007  . OSTEOARTHRITIS 03/28/2007  . BACK PAIN 03/28/2007  . PLANTAR FASCIITIS 03/28/2007  . FIBROMYALGIA 03/28/2007  . MIGRAINES, HX OF 03/28/2007   Past Medical History  Diagnosis Date  . GERD (gastroesophageal reflux disease)   . HLD (hyperlipidemia)   . HTN (hypertension)   . Hypothyroidism   . Osteoarthritis     hands  . Anemia, iron deficiency   . Diabetes mellitus type II     Diet controlled  . Edema   . Raynaud's disease   . Fibromyalgia   . Dry eyes   . Recurrent HSV (herpes simplex virus)     lesions in nose  .  HNP (herniated nucleus pulposus) 2/99    T6,7,8 with DJD  . DJD (degenerative joint disease)   . Cervical dysplasia     abnormal paps  . Left ovarian cyst     x 3, rupture  . Osteopenia     mild-11/01; improved 12/05  . Diverticulosis   . Hemorrhoids     external  . Interstitial lung disease ([place])     ?  . Palpitations   . Bilateral lower extremity edema     a. uses torsemide   Past Surgical History  Procedure Laterality Date  . Hand surgery      left thumb  . Colonoscopy  11/01    Diverticulosis; hemorrhoids  . Lasik      bilateral   Social History  Substance Use Topics  .  Smoking status: Former Smoker    Types: Cigarettes  . Smokeless tobacco: Never Used     Comment: quit over 40 years  . Alcohol Use: No   Family History  Problem Relation Age of Onset  . Emphysema Father     + smoker  . Lung cancer Mother     + smoker  . Coronary artery disease Mother     relatively young  . Lymphoma Brother   . Lymphoma Sister   . Diabetes Brother   . Diabetes Sister   . Heart disease Brother   . Anemia Brother     aplactic    Allergies  Allergen Reactions  . Keflex [Cephalexin] Anaphylaxis  . Penicillins Anaphylaxis  . Amitriptyline Hcl     REACTION: sedating  . Atorvastatin     REACTION: muscle  . Benicar [Olmesartan Medoxomil]     Muscle pain   . Ceftin [Cefuroxime]     Swelling, "legs turn blue"  . Cetirizine Hcl     REACTION: headache  . Ciprofloxacin     REACTION: ? rash vs sun rxn  . Clonidine Derivatives     Swelling   . Diltiazem Hcl     REACTION: reaction not known  . Diovan [Valsartan]     Thought it made her feel confused  . Erythromycin     Rash, swollen gums   . Etodolac     REACTION: reaction not known  . Fluoxetine Hcl     REACTION: stomach problems  . Furosemide     REACTION: swelling  . Gabapentin     REACTION: edema  . Naproxen Sodium     REACTION: edema  . Paroxetine     REACTION: weight gain  . Pregabalin     REACTION: swelling  . Sulfonamide Derivatives     Rash, swollen gums, lips  . Tetracycline     REACTION:inflammed genitals  . Venlafaxine     REACTION: sweating  . Cephalexin Hives and Rash   Current Outpatient Prescriptions on File Prior to Visit  Medication Sig Dispense Refill  . buPROPion (WELLBUTRIN XL) 150 MG 24 hr tablet TAKE 1 TABLET DAILY 90 tablet 2  . Chlorphen-Phenyleph-ASA (ALKA-SELTZER PLUS COLD PO) Take by mouth as needed.      . cyclobenzaprine (FLEXERIL) 10 MG tablet TAKE 1/2 TO 1 TABLET BY MOUTH ONCE DAILY AND 1 TABELT BY MOUTH AT BEDTIME 180 tablet 1  . Diclofenac Sodium 1.5 % SOLN  Place 2 mLs onto the skin 4 (four) times daily. 3 Bottle 3  . DIGESTIVE ENZYMES PO Take by mouth daily.     Marland Kitchen diltiazem (CARDIZEM LA) 120 MG 24 hr tablet Take 1  tablet (120 mg total) by mouth daily. As needed for raynaud's symptoms 30 tablet 3  . diphenhydrAMINE (BENADRYL) 25 mg capsule Take 25 mg by mouth as needed.      . DULoxetine (CYMBALTA) 60 MG capsule TAKE 1 CAPSULE DAILY 90 capsule 3  . esomeprazole (NEXIUM) 20 MG capsule TAKE 1 CAPSULE TWICE A DAY 180 capsule 3  . glucose blood test strip One Touch Ultra stripts blue-To check sugar once daily and as needed for DM2 250.00 100 each 3  . GUAIFENESIN CR PO Take by mouth as needed.      . hyoscyamine (LEVSIN SL) 0.125 MG SL tablet Take 1 tablet (0.125 mg total) by mouth every 4 (four) hours as needed for cramping. 270 tablet 0  . KRILL OIL PO Take by mouth daily.    Marland Kitchen levofloxacin (LEVAQUIN) 500 MG tablet Take 500 mg by mouth as needed.    Marland Kitchen levothyroxine (SYNTHROID, LEVOTHROID) 150 MCG tablet TAKE 1 TABLET DAILY 90 tablet 0  . Magnesium 400 MG CAPS Take by mouth daily.     . metFORMIN (GLUCOPHAGE) 500 MG tablet TAKE 1 TABLET TWICE A DAY WITH MEALS 180 tablet 0  . metoprolol succinate (TOPROL-XL) 50 MG 24 hr tablet Take 1 tablet (50 mg total) by mouth 2 (two) times daily. 180 tablet 1  . NASONEX 50 MCG/ACT nasal spray USE 2 SPRAYS IN NOSTIL(S) DAILY AS DIRECTED 51 g 1  . oxyCODONE (OXYCONTIN) 10 MG 12 hr tablet Take 10 mg by mouth 2 (two) times daily.     Marland Kitchen oxymorphone (OPANA ER) 20 MG 12 hr tablet Take 20 mg by mouth every 12 (twelve) hours.    . ramipril (ALTACE) 5 MG capsule TAKE 1 CAPSULE DAILY 90 capsule 0  . ranitidine (ZANTAC) 300 MG tablet TAKE 1 TABLET AT BEDTIME 90 tablet 0  . torsemide (DEMADEX) 20 MG tablet Take 1 tablet (20 mg total) by mouth 2 (two) times daily. 180 tablet 1   No current facility-administered medications on file prior to visit.     Review of Systems Review of Systems  Constitutional: Negative for fever,  appetite change, fatigue and unexpected weight change.  Eyes: Negative for pain and visual disturbance.  Respiratory: Negative for cough and shortness of breath.   Cardiovascular: Negative for cp or palpitations    Gastrointestinal: Negative for nausea, diarrhea and constipation.  Genitourinary: Negative for urgency and frequency.  Skin: Negative for pallor or rash   MSK pos for chronic joint and myofascial pain that is improved  Neurological: Negative for weakness, light-headedness, numbness and headaches.  Hematological: Negative for adenopathy. Does not bruise/bleed easily.  Psychiatric/Behavioral: Negative for dysphoric mood. The patient is not nervous/anxious.      Objective:   Physical Exam  Constitutional: She appears well-developed and well-nourished. No distress.  obese and well appearing   HENT:  Head: Normocephalic and atraumatic.  Mouth/Throat: Oropharynx is clear and moist.  Eyes: Conjunctivae and EOM are normal. Pupils are equal, round, and reactive to light.  Neck: Normal range of motion. Neck supple. No JVD present. Carotid bruit is not present. No thyromegaly present.  Cardiovascular: Normal rate, regular rhythm, normal heart sounds and intact distal pulses.  Exam reveals no gallop.   Pulmonary/Chest: Effort normal and breath sounds normal. No respiratory distress. She has no wheezes. She has no rales.  No crackles  Abdominal: Soft. Bowel sounds are normal. She exhibits no distension, no abdominal bruit and no mass. There is no tenderness.  Musculoskeletal:  She exhibits no edema.  Lymphadenopathy:    She has no cervical adenopathy.  Neurological: She is alert. She has normal reflexes.  Skin: Skin is warm and dry. No rash noted.  Discoloration of toes/fingers from Raynauds (baseline)  Psychiatric: She has a normal mood and affect.          Assessment & Plan:   Problem List Items Addressed This Visit      Cardiovascular and Mediastinum   HTN (hypertension) -  Primary    bp in fair control at this time  BP Readings from Last 1 Encounters:  08/25/15 122/72   No changes needed Disc lifstyle change with low sodium diet and exercise  Labs today  Enc wt loss and exercise       Relevant Orders   CBC with Differential/Platelet (Completed)   Comprehensive metabolic panel (Completed)   TSH (Completed)   Lipid panel (Completed)     Endocrine   DM type 2 (diabetes mellitus, type 2) ([place])    Due for A1C Checks glucose occ  Diet is improved Tolerating metformin        Relevant Orders   Hemoglobin A1c (Completed)   Hypothyroidism    Hypothyroidism  Pt has no clinical changes No change in energy level/ hair or skin/ edema and no tremor tsh today        Relevant Orders   TSH (Completed)     Other   Hyperglycemia   Relevant Orders   Hemoglobin A1c (Completed)   Hyperlipidemia    Disc goals for lipids and reasons to control them Rev labs with pt from last check  Lab today Intolerant of statin in the past  Rev low sat fat diet in detail      Relevant Orders   Lipid panel (Completed)   Obesity    Discussed how this problem influences overall health and the risks it imposes  Reviewed plan for weight loss with lower calorie diet (via better food choices and also portion control or program like weight watchers) and exercise building up to or more than 30 minutes 5 days per week including some aerobic activity   Pt can get a little more exercise now that pain is under better control with pain clinic        Other Visit Diagnoses    Need for influenza vaccination        Relevant Orders    Flu Vaccine QUAD 36+ mos PF IM (Fluarix & Fluzone Quad PF) (Completed)    Need for vaccination with 13-polyvalent pneumococcal conjugate vaccine        Relevant Orders    Pneumococcal conjugate vaccine 13-valent (Completed)

## 2015-08-25 NOTE — Progress Notes (Signed)
Pre visit review using our clinic review tool, if applicable. No additional management support is needed unless otherwise documented below in the visit note. 

## 2015-08-25 NOTE — Assessment & Plan Note (Signed)
Disc goals for lipids and reasons to control them Rev labs with pt from last check  Lab today Intolerant of statin in the past  Rev low sat fat diet in detail

## 2015-08-25 NOTE — Assessment & Plan Note (Signed)
bp in fair control at this time  BP Readings from Last 1 Encounters:  08/25/15 122/72   No changes needed Disc lifstyle change with low sodium diet and exercise  Labs today  Enc wt loss and exercise

## 2015-08-25 NOTE — Assessment & Plan Note (Signed)
Due for A1C Checks glucose occ  Diet is improved Tolerating metformin

## 2015-08-25 NOTE — Assessment & Plan Note (Signed)
Hypothyroidism  Pt has no clinical changes No change in energy level/ hair or skin/ edema and no tremor tsh today 

## 2015-08-29 ENCOUNTER — Other Ambulatory Visit: Payer: Self-pay | Admitting: *Deleted

## 2015-08-29 MED ORDER — LEVOTHYROXINE SODIUM 150 MCG PO TABS: 150.0000 ug | ORAL_TABLET | Freq: Every day | ORAL | Status: DC

## 2015-08-29 MED ORDER — BUPROPION HCL ER (XL) 150 MG PO TB24: 150.0000 mg | ORAL_TABLET | Freq: Every day | ORAL | Status: DC

## 2015-08-29 MED ORDER — RANITIDINE HCL 300 MG PO TABS: 300.0000 mg | ORAL_TABLET | Freq: Every day | ORAL | Status: DC

## 2015-08-29 MED ORDER — METFORMIN HCL 500 MG PO TABS: 500.0000 mg | ORAL_TABLET | Freq: Two times a day (BID) | ORAL | Status: DC

## 2015-08-29 MED ORDER — RAMIPRIL 5 MG PO CAPS: 5.0000 mg | ORAL_CAPSULE | Freq: Every day | ORAL | Status: DC

## 2015-09-22 ENCOUNTER — Other Ambulatory Visit: Payer: Self-pay | Admitting: Family Medicine

## 2015-09-29 ENCOUNTER — Encounter: Payer: Self-pay | Admitting: Cardiovascular Disease

## 2015-09-29 ENCOUNTER — Ambulatory Visit (INDEPENDENT_AMBULATORY_CARE_PROVIDER_SITE_OTHER): Payer: Medicare Other | Admitting: Cardiovascular Disease

## 2015-09-29 VITALS — BP 138/90 | HR 76 | Ht 62.5 in | Wt 208.0 lb

## 2015-09-29 DIAGNOSIS — E785 Hyperlipidemia, unspecified: Secondary | ICD-10-CM | POA: Diagnosis not present

## 2015-09-29 DIAGNOSIS — E119 Type 2 diabetes mellitus without complications: Secondary | ICD-10-CM | POA: Diagnosis not present

## 2015-09-29 DIAGNOSIS — R600 Localized edema: Secondary | ICD-10-CM

## 2015-09-29 DIAGNOSIS — I10 Essential (primary) hypertension: Secondary | ICD-10-CM

## 2015-09-29 DIAGNOSIS — I059 Rheumatic mitral valve disease, unspecified: Secondary | ICD-10-CM

## 2015-09-29 DIAGNOSIS — I7300 Raynaud's syndrome without gangrene: Secondary | ICD-10-CM

## 2015-09-29 MED ORDER — DILTIAZEM HCL 30 MG PO TABS: 30.0000 mg | ORAL_TABLET | Freq: Three times a day (TID) | ORAL | Status: DC | PRN

## 2015-09-29 NOTE — Assessment & Plan Note (Signed)
Blood pressure is well controlled on today's visit. No changes made to the medications. 

## 2015-09-29 NOTE — Assessment & Plan Note (Signed)
Currently not on a cholesterol medication. Notes indicate previous myalgias on Lipitor Given her chronic pain, fibromyalgia symptoms, we'll not start at this time

## 2015-09-29 NOTE — Assessment & Plan Note (Signed)
We have encouraged continued exercise, careful diet management in an effort to lose weight. Recommended she exercise in a salt water pool as she has allergy to chlorine

## 2015-09-29 NOTE — Assessment & Plan Note (Signed)
Minimal leg edema on today's visit Previously with swelling, improved with compression hose, holding calcium channel blockers

## 2015-09-29 NOTE — Assessment & Plan Note (Signed)
Recommended she take diltiazem 30 mg pills as needed for Raynaud symptoms. She could take diltiazem 120 mg pill but would hold the metoprolol. This could be associated with worsening leg edema.

## 2015-09-29 NOTE — Progress Notes (Signed)
Patient ID: Tina Berger, female    DOB: Aug 19, 1951, 64 y.o.   MRN: [id number]  HPI Comments: Tina Berger is a 64 year old woman with a history of Raynaud's disease, hyperlipidemia, HTN, obesity, chronic lower extremity swelling who presents for followup of her palpitations and hypertension, edema.  In follow-up, she reports that she is having significant Raynaud's in her feet This is been a long-standing issue for 65 years Wonders if she can restart her diltiazem Minimal symptoms in her hands Reports she has fibromyalgia, takes pain medication as well as Cymbalta Biggest complaint is feeling very hot, sweating. She has not been taking torsemide on a regular basis Palpitations reasonably well controlled on metoprolol  EKG on today's visit shows normal sinus rhythm with rate 65 bpm, no significant ST or T-wave changes  Other past medical history She continues to drink a significant amount of fluid during the daytime She reports that on the metoprolol, her palpitations have been well-controlled.     Echocardiogram in October 2008 was essentially normal.   Stress test in October 2008 showed no ischemia. This is a exercise treadmill. She exercised for 4 minutes 30 seconds, peak heart rate of 152 beats per minute, peak blood pressure 172/94.       Allergies  Allergen Reactions  . Keflex [Cephalexin] Anaphylaxis  . Penicillins Anaphylaxis  . Amitriptyline Hcl     REACTION: sedating  . Atorvastatin     REACTION: muscle  . Benicar [Olmesartan Medoxomil]     Muscle pain   . Ceftin [Cefuroxime]     Swelling, "legs turn blue"  . Cetirizine Hcl     REACTION: headache  . Ciprofloxacin     REACTION: ? rash vs sun rxn  . Clonidine Derivatives     Swelling   . Diltiazem Hcl     REACTION: reaction not known  . Diovan [Valsartan]     Thought it made her feel confused  . Erythromycin     Rash, swollen gums   . Etodolac     REACTION: reaction not known  . Fluoxetine Hcl    REACTION: stomach problems  . Furosemide     REACTION: swelling  . Gabapentin     REACTION: edema  . Naproxen Sodium     REACTION: edema  . Paroxetine     REACTION: weight gain  . Pregabalin     REACTION: swelling  . Sulfonamide Derivatives     Rash, swollen gums, lips  . Tetracycline     REACTION:inflammed genitals  . Venlafaxine     REACTION: sweating  . Cephalexin Hives and Rash    Current Outpatient Prescriptions on File Prior to Visit  Medication Sig Dispense Refill  . buPROPion (WELLBUTRIN XL) 150 MG 24 hr tablet Take 1 tablet (150 mg total) by mouth daily. 90 tablet 1  . Chlorphen-Phenyleph-ASA (ALKA-SELTZER PLUS COLD PO) Take by mouth as needed.      . cyclobenzaprine (FLEXERIL) 10 MG tablet TAKE 1/2 TO 1 TABLET BY MOUTH ONCE DAILY AND 1 TABELT BY MOUTH AT BEDTIME 180 tablet 1  . Diclofenac Sodium 1.5 % SOLN Place 2 mLs onto the skin 4 (four) times daily. 3 Bottle 3  . DIGESTIVE ENZYMES PO Take by mouth daily.     Marland Kitchen diltiazem (CARDIZEM LA) 120 MG 24 hr tablet Take 1 tablet (120 mg total) by mouth daily. As needed for raynaud's symptoms 30 tablet 3  . diphenhydrAMINE (BENADRYL) 25 mg capsule Take 25 mg by mouth as needed.      Marland Kitchen  DULoxetine (CYMBALTA) 60 MG capsule TAKE 1 CAPSULE DAILY 90 capsule 1  . esomeprazole (NEXIUM) 20 MG capsule TAKE 1 CAPSULE TWICE A DAY 180 capsule 3  . glucose blood test strip One Touch Ultra stripts blue-To check sugar once daily and as needed for DM2 250.00 100 each 3  . GUAIFENESIN CR PO Take by mouth as needed.      . hyoscyamine (LEVSIN SL) 0.125 MG SL tablet Take 1 tablet (0.125 mg total) by mouth every 4 (four) hours as needed for cramping. 270 tablet 0  . KRILL OIL PO Take by mouth daily.    Marland Kitchen levofloxacin (LEVAQUIN) 500 MG tablet Take 500 mg by mouth as needed.    Marland Kitchen levothyroxine (SYNTHROID, LEVOTHROID) 150 MCG tablet Take 1 tablet (150 mcg total) by mouth daily. 90 tablet 3  . Magnesium 400 MG CAPS Take by mouth daily.     . metFORMIN  (GLUCOPHAGE) 500 MG tablet Take 1 tablet (500 mg total) by mouth 2 (two) times daily with a meal. 180 tablet 1  . metoprolol succinate (TOPROL-XL) 50 MG 24 hr tablet Take 1 tablet (50 mg total) by mouth 2 (two) times daily. 180 tablet 1  . oxyCODONE (OXYCONTIN) 10 MG 12 hr tablet Take 10 mg by mouth 2 (two) times daily.     Marland Kitchen oxymorphone (OPANA ER) 20 MG 12 hr tablet Take 20 mg by mouth every 12 (twelve) hours.    . ramipril (ALTACE) 5 MG capsule Take 1 capsule (5 mg total) by mouth daily. 90 capsule 3  . ranitidine (ZANTAC) 300 MG tablet Take 1 tablet (300 mg total) by mouth at bedtime. 90 tablet 3  . torsemide (DEMADEX) 20 MG tablet Take 1 tablet (20 mg total) by mouth 2 (two) times daily. (Patient taking differently: Take 20 mg by mouth 2 (two) times daily as needed. ) 180 tablet 1   No current facility-administered medications on file prior to visit.    Past Medical History  Diagnosis Date  . GERD (gastroesophageal reflux disease)   . HLD (hyperlipidemia)   . HTN (hypertension)   . Hypothyroidism   . Osteoarthritis     hands  . Anemia, iron deficiency   . Diabetes mellitus type II     Diet controlled  . Edema   . Raynaud's disease   . Fibromyalgia   . Dry eyes   . Recurrent HSV (herpes simplex virus)     lesions in nose  . HNP (herniated nucleus pulposus) 2/99    T6,7,8 with DJD  . DJD (degenerative joint disease)   . Cervical dysplasia     abnormal paps  . Left ovarian cyst     x 3, rupture  . Osteopenia     mild-11/01; improved 12/05  . Diverticulosis   . Hemorrhoids     external  . Interstitial lung disease ([place])     ?  . Palpitations   . Bilateral lower extremity edema     a. uses torsemide    Past Surgical History  Procedure Laterality Date  . Hand surgery      left thumb  . Colonoscopy  11/01    Diverticulosis; hemorrhoids  . Lasik      bilateral    Social History  reports that she has quit smoking. Her smoking use included Cigarettes. She has never  used smokeless tobacco. She reports that she does not drink alcohol or use illicit drugs.  Family History family history includes Anemia in her brother;  Coronary artery disease in her mother; Diabetes in her brother and sister; Emphysema in her father; Heart disease in her brother; Lung cancer in her mother; Lymphoma in her brother and sister.   Review of Systems  Constitutional: Negative.   Respiratory: Negative.   Cardiovascular: Negative.   Gastrointestinal: Negative.   Musculoskeletal: Negative.   Neurological: Negative.   Hematological: Negative.   Psychiatric/Behavioral: Negative.   All other systems reviewed and are negative.   BP 138/90 mmHg  Pulse 76  Ht 5' 2.5" (1.588 m)  Wt 208 lb (94.348 kg)  BMI 37.41 kg/m2  Physical Exam  Constitutional: She is oriented to person, place, and time. She appears well-developed and well-nourished.  Obese  HENT:  Head: Normocephalic.  Nose: Nose normal.  Mouth/Throat: Oropharynx is clear and moist.  Eyes: Conjunctivae are normal. Pupils are equal, round, and reactive to light.  Neck: Normal range of motion. Neck supple. No JVD present.  Cardiovascular: Normal rate, regular rhythm, S1 normal, S2 normal, normal heart sounds and intact distal pulses.  Exam reveals no gallop and no friction rub.   No murmur heard. Trace pitting edema around the ankles and feet  Pulmonary/Chest: Effort normal and breath sounds normal. No respiratory distress. She has no wheezes. She has no rales. She exhibits no tenderness.  Abdominal: Soft. Bowel sounds are normal. She exhibits no distension. There is no tenderness.  Musculoskeletal: Normal range of motion. She exhibits no edema or tenderness.  Lymphadenopathy:    She has no cervical adenopathy.  Neurological: She is alert and oriented to person, place, and time. Coordination normal.  Skin: Skin is warm and dry. No rash noted. No erythema.  Psychiatric: She has a normal mood and affect. Her behavior is  normal. Judgment and thought content normal.    Assessment and Plan  Nursing note and vitals reviewed.

## 2015-09-29 NOTE — Patient Instructions (Addendum)
You are doing well.  If you take diltiazem  120 mg once a day, Please hold the metoprolol   Ok to take diltiazem 30 mg pills as needed for symptoms of palpitations or raynauds  Please call us if you have new issues that need to be addressed before your next appt.  Your physician wants you to follow-up in: 6 months.  You will receive a reminder letter in the mail two months in advance. If you don't receive a letter, please call our office to schedule the follow-up appointment.

## 2015-11-05 DIAGNOSIS — N951 Menopausal and female climacteric states: Secondary | ICD-10-CM | POA: Diagnosis not present

## 2015-11-05 DIAGNOSIS — R635 Abnormal weight gain: Secondary | ICD-10-CM | POA: Diagnosis not present

## 2015-11-06 DIAGNOSIS — M4725 Other spondylosis with radiculopathy, thoracolumbar region: Secondary | ICD-10-CM | POA: Diagnosis not present

## 2015-11-06 DIAGNOSIS — G894 Chronic pain syndrome: Secondary | ICD-10-CM | POA: Diagnosis not present

## 2015-11-06 DIAGNOSIS — Z79891 Long term (current) use of opiate analgesic: Secondary | ICD-10-CM | POA: Diagnosis not present

## 2015-11-06 DIAGNOSIS — M5137 Other intervertebral disc degeneration, lumbosacral region: Secondary | ICD-10-CM | POA: Diagnosis not present

## 2015-11-06 DIAGNOSIS — M961 Postlaminectomy syndrome, not elsewhere classified: Secondary | ICD-10-CM | POA: Diagnosis not present

## 2015-11-10 DIAGNOSIS — E1165 Type 2 diabetes mellitus with hyperglycemia: Secondary | ICD-10-CM | POA: Diagnosis not present

## 2015-11-10 DIAGNOSIS — K219 Gastro-esophageal reflux disease without esophagitis: Secondary | ICD-10-CM | POA: Diagnosis not present

## 2015-11-10 DIAGNOSIS — E782 Mixed hyperlipidemia: Secondary | ICD-10-CM | POA: Diagnosis not present

## 2015-11-11 DIAGNOSIS — E1165 Type 2 diabetes mellitus with hyperglycemia: Secondary | ICD-10-CM | POA: Diagnosis not present

## 2015-11-11 DIAGNOSIS — Z6838 Body mass index (BMI) 38.0-38.9, adult: Secondary | ICD-10-CM | POA: Diagnosis not present

## 2015-11-11 DIAGNOSIS — K219 Gastro-esophageal reflux disease without esophagitis: Secondary | ICD-10-CM | POA: Diagnosis not present

## 2015-11-11 DIAGNOSIS — E782 Mixed hyperlipidemia: Secondary | ICD-10-CM | POA: Diagnosis not present

## 2015-11-11 DIAGNOSIS — E8881 Metabolic syndrome: Secondary | ICD-10-CM | POA: Diagnosis not present

## 2015-11-17 ENCOUNTER — Encounter: Payer: Self-pay | Admitting: Family Medicine

## 2015-11-18 DIAGNOSIS — E538 Deficiency of other specified B group vitamins: Secondary | ICD-10-CM | POA: Diagnosis not present

## 2015-11-18 DIAGNOSIS — K219 Gastro-esophageal reflux disease without esophagitis: Secondary | ICD-10-CM | POA: Diagnosis not present

## 2015-11-18 DIAGNOSIS — E1165 Type 2 diabetes mellitus with hyperglycemia: Secondary | ICD-10-CM | POA: Diagnosis not present

## 2015-11-19 ENCOUNTER — Telehealth: Payer: Self-pay | Admitting: Family Medicine

## 2015-11-19 MED ORDER — RAMIPRIL 5 MG PO CAPS: 5.0000 mg | ORAL_CAPSULE | Freq: Every day | ORAL | Status: DC

## 2015-11-19 MED ORDER — LEVOTHYROXINE SODIUM 150 MCG PO TABS: 150.0000 ug | ORAL_TABLET | Freq: Every day | ORAL | Status: DC

## 2015-11-19 MED ORDER — DULOXETINE HCL 60 MG PO CPEP: 60.0000 mg | ORAL_CAPSULE | Freq: Every day | ORAL | Status: DC

## 2015-11-19 MED ORDER — METFORMIN HCL 500 MG PO TABS: 500.0000 mg | ORAL_TABLET | Freq: Two times a day (BID) | ORAL | Status: DC

## 2015-11-19 MED ORDER — BUPROPION HCL ER (XL) 150 MG PO TB24: 150.0000 mg | ORAL_TABLET | Freq: Every day | ORAL | Status: DC

## 2015-11-19 MED ORDER — RANITIDINE HCL 300 MG PO TABS
300.0000 mg | ORAL_TABLET | Freq: Every day | ORAL | Status: DC
Start: 2015-11-19 — End: 2016-09-18

## 2015-11-19 NOTE — Telephone Encounter (Signed)
Pt left v/m and pt does want rx mailed to pts home.

## 2015-11-19 NOTE — Telephone Encounter (Signed)
Rx mailed per pt request.

## 2015-11-19 NOTE — Telephone Encounter (Signed)
Refill meds through [facility] Printed cymbalta See mychart mess

## 2015-11-19 NOTE — Telephone Encounter (Signed)
Left voicemail asking pt if she wants to pick up the cymbalta or does she want [place] to mail it to her and requested pt to call office and let us know

## 2015-11-25 DIAGNOSIS — E538 Deficiency of other specified B group vitamins: Secondary | ICD-10-CM | POA: Diagnosis not present

## 2015-11-25 DIAGNOSIS — E782 Mixed hyperlipidemia: Secondary | ICD-10-CM | POA: Diagnosis not present

## 2015-11-25 DIAGNOSIS — E1165 Type 2 diabetes mellitus with hyperglycemia: Secondary | ICD-10-CM | POA: Diagnosis not present

## 2015-12-02 DIAGNOSIS — E039 Hypothyroidism, unspecified: Secondary | ICD-10-CM | POA: Diagnosis not present

## 2015-12-02 DIAGNOSIS — E1165 Type 2 diabetes mellitus with hyperglycemia: Secondary | ICD-10-CM | POA: Diagnosis not present

## 2015-12-02 DIAGNOSIS — E538 Deficiency of other specified B group vitamins: Secondary | ICD-10-CM | POA: Diagnosis not present

## 2015-12-09 DIAGNOSIS — E1165 Type 2 diabetes mellitus with hyperglycemia: Secondary | ICD-10-CM | POA: Diagnosis not present

## 2015-12-09 DIAGNOSIS — E039 Hypothyroidism, unspecified: Secondary | ICD-10-CM | POA: Diagnosis not present

## 2015-12-09 DIAGNOSIS — Z6836 Body mass index (BMI) 36.0-36.9, adult: Secondary | ICD-10-CM | POA: Diagnosis not present

## 2015-12-09 DIAGNOSIS — E8881 Metabolic syndrome: Secondary | ICD-10-CM | POA: Diagnosis not present

## 2015-12-09 DIAGNOSIS — E538 Deficiency of other specified B group vitamins: Secondary | ICD-10-CM | POA: Diagnosis not present

## 2015-12-19 ENCOUNTER — Encounter: Payer: Self-pay | Admitting: Cardiovascular Disease

## 2015-12-23 DIAGNOSIS — E1165 Type 2 diabetes mellitus with hyperglycemia: Secondary | ICD-10-CM | POA: Diagnosis not present

## 2015-12-23 DIAGNOSIS — E039 Hypothyroidism, unspecified: Secondary | ICD-10-CM | POA: Diagnosis not present

## 2015-12-23 DIAGNOSIS — E538 Deficiency of other specified B group vitamins: Secondary | ICD-10-CM | POA: Diagnosis not present

## 2015-12-30 DIAGNOSIS — E039 Hypothyroidism, unspecified: Secondary | ICD-10-CM | POA: Diagnosis not present

## 2015-12-30 DIAGNOSIS — E538 Deficiency of other specified B group vitamins: Secondary | ICD-10-CM | POA: Diagnosis not present

## 2015-12-30 DIAGNOSIS — E1165 Type 2 diabetes mellitus with hyperglycemia: Secondary | ICD-10-CM | POA: Diagnosis not present

## 2016-01-01 DIAGNOSIS — Z79891 Long term (current) use of opiate analgesic: Secondary | ICD-10-CM | POA: Diagnosis not present

## 2016-01-01 DIAGNOSIS — M5137 Other intervertebral disc degeneration, lumbosacral region: Secondary | ICD-10-CM | POA: Diagnosis not present

## 2016-01-01 DIAGNOSIS — G894 Chronic pain syndrome: Secondary | ICD-10-CM | POA: Diagnosis not present

## 2016-01-01 DIAGNOSIS — M961 Postlaminectomy syndrome, not elsewhere classified: Secondary | ICD-10-CM | POA: Diagnosis not present

## 2016-01-01 DIAGNOSIS — M4725 Other spondylosis with radiculopathy, thoracolumbar region: Secondary | ICD-10-CM | POA: Diagnosis not present

## 2016-01-07 ENCOUNTER — Other Ambulatory Visit: Payer: Self-pay

## 2016-01-07 ENCOUNTER — Encounter: Payer: Self-pay | Admitting: Cardiovascular Disease

## 2016-01-07 MED ORDER — METFORMIN HCL 500 MG PO TABS: 500.0000 mg | ORAL_TABLET | Freq: Two times a day (BID) | ORAL | Status: DC

## 2016-01-07 MED ORDER — TORSEMIDE 20 MG PO TABS: 20.0000 mg | ORAL_TABLET | Freq: Two times a day (BID) | ORAL | Status: DC | PRN

## 2016-01-13 ENCOUNTER — Other Ambulatory Visit: Payer: Self-pay

## 2016-01-13 MED ORDER — METOPROLOL SUCCINATE ER 50 MG PO TB24: 50.0000 mg | ORAL_TABLET | Freq: Two times a day (BID) | ORAL | Status: DC

## 2016-01-20 DIAGNOSIS — E538 Deficiency of other specified B group vitamins: Secondary | ICD-10-CM | POA: Diagnosis not present

## 2016-01-20 DIAGNOSIS — E039 Hypothyroidism, unspecified: Secondary | ICD-10-CM | POA: Diagnosis not present

## 2016-01-20 DIAGNOSIS — E1165 Type 2 diabetes mellitus with hyperglycemia: Secondary | ICD-10-CM | POA: Diagnosis not present

## 2016-02-17 ENCOUNTER — Encounter: Payer: Self-pay | Admitting: Gastroenterology

## 2016-03-04 DIAGNOSIS — M5137 Other intervertebral disc degeneration, lumbosacral region: Secondary | ICD-10-CM | POA: Diagnosis not present

## 2016-03-04 DIAGNOSIS — M961 Postlaminectomy syndrome, not elsewhere classified: Secondary | ICD-10-CM | POA: Diagnosis not present

## 2016-03-04 DIAGNOSIS — Z79891 Long term (current) use of opiate analgesic: Secondary | ICD-10-CM | POA: Diagnosis not present

## 2016-03-04 DIAGNOSIS — M4725 Other spondylosis with radiculopathy, thoracolumbar region: Secondary | ICD-10-CM | POA: Diagnosis not present

## 2016-03-04 DIAGNOSIS — G894 Chronic pain syndrome: Secondary | ICD-10-CM | POA: Diagnosis not present

## 2016-04-12 ENCOUNTER — Other Ambulatory Visit: Payer: Self-pay | Admitting: Family Medicine

## 2016-04-12 NOTE — Telephone Encounter (Signed)
Last filled on 02/18/15 #180 with 1 additional refill, please advise

## 2016-04-13 NOTE — Telephone Encounter (Signed)
done

## 2016-04-13 NOTE — Telephone Encounter (Signed)
Please refill 180 times 3

## 2016-04-29 DIAGNOSIS — Z79891 Long term (current) use of opiate analgesic: Secondary | ICD-10-CM | POA: Diagnosis not present

## 2016-04-29 DIAGNOSIS — M4725 Other spondylosis with radiculopathy, thoracolumbar region: Secondary | ICD-10-CM | POA: Diagnosis not present

## 2016-04-29 DIAGNOSIS — M5137 Other intervertebral disc degeneration, lumbosacral region: Secondary | ICD-10-CM | POA: Diagnosis not present

## 2016-04-29 DIAGNOSIS — G894 Chronic pain syndrome: Secondary | ICD-10-CM | POA: Diagnosis not present

## 2016-04-29 DIAGNOSIS — M961 Postlaminectomy syndrome, not elsewhere classified: Secondary | ICD-10-CM | POA: Diagnosis not present

## 2016-05-03 ENCOUNTER — Other Ambulatory Visit: Payer: Self-pay | Admitting: Acute Care

## 2016-05-03 DIAGNOSIS — M961 Postlaminectomy syndrome, not elsewhere classified: Secondary | ICD-10-CM

## 2016-05-12 ENCOUNTER — Other Ambulatory Visit: Payer: Self-pay | Admitting: Acute Care

## 2016-05-12 DIAGNOSIS — M961 Postlaminectomy syndrome, not elsewhere classified: Secondary | ICD-10-CM

## 2016-05-15 ENCOUNTER — Ambulatory Visit: Payer: Medicare Other

## 2016-05-24 ENCOUNTER — Ambulatory Visit: Payer: Medicare Other

## 2016-06-03 ENCOUNTER — Ambulatory Visit
Admission: RE | Admit: 2016-06-03 | Discharge: 2016-06-03 | Disposition: A | Discharge status: Home / Self Care | Payer: Medicare Other | Source: Ambulatory Visit | Attending: Acute Care | Admitting: Acute Care

## 2016-06-03 DIAGNOSIS — M4802 Spinal stenosis, cervical region: Secondary | ICD-10-CM | POA: Insufficient documentation

## 2016-06-03 DIAGNOSIS — M961 Postlaminectomy syndrome, not elsewhere classified: Secondary | ICD-10-CM

## 2016-06-03 DIAGNOSIS — M47812 Spondylosis without myelopathy or radiculopathy, cervical region: Secondary | ICD-10-CM | POA: Diagnosis not present

## 2016-06-03 DIAGNOSIS — M4322 Fusion of spine, cervical region: Secondary | ICD-10-CM | POA: Insufficient documentation

## 2016-06-03 DIAGNOSIS — E882 Lipomatosis, not elsewhere classified: Secondary | ICD-10-CM | POA: Insufficient documentation

## 2016-06-03 DIAGNOSIS — M545 Low back pain: Secondary | ICD-10-CM | POA: Diagnosis not present

## 2016-06-03 DIAGNOSIS — M47892 Other spondylosis, cervical region: Secondary | ICD-10-CM | POA: Diagnosis not present

## 2016-06-03 DIAGNOSIS — M5124 Other intervertebral disc displacement, thoracic region: Secondary | ICD-10-CM | POA: Insufficient documentation

## 2016-06-03 LAB — POCT I-STAT CREATININE: CREATININE: 1 mg/dL (ref 0.44–1.00)

## 2016-06-17 DIAGNOSIS — M5137 Other intervertebral disc degeneration, lumbosacral region: Secondary | ICD-10-CM | POA: Diagnosis not present

## 2016-06-17 DIAGNOSIS — M4725 Other spondylosis with radiculopathy, thoracolumbar region: Secondary | ICD-10-CM | POA: Diagnosis not present

## 2016-06-17 DIAGNOSIS — M961 Postlaminectomy syndrome, not elsewhere classified: Secondary | ICD-10-CM | POA: Diagnosis not present

## 2016-06-17 DIAGNOSIS — G894 Chronic pain syndrome: Secondary | ICD-10-CM | POA: Diagnosis not present

## 2016-06-17 DIAGNOSIS — Z79891 Long term (current) use of opiate analgesic: Secondary | ICD-10-CM | POA: Diagnosis not present

## 2016-07-06 ENCOUNTER — Other Ambulatory Visit: Payer: Self-pay | Admitting: Cardiovascular Disease

## 2016-07-15 ENCOUNTER — Telehealth: Payer: Self-pay | Admitting: Cardiovascular Disease

## 2016-07-15 NOTE — Telephone Encounter (Signed)
3 attempts to schedule fu from recall list.  No ans no vm.  Deleting recall.   °

## 2016-07-21 DIAGNOSIS — M5137 Other intervertebral disc degeneration, lumbosacral region: Secondary | ICD-10-CM | POA: Diagnosis not present

## 2016-07-21 DIAGNOSIS — G894 Chronic pain syndrome: Secondary | ICD-10-CM | POA: Diagnosis not present

## 2016-07-21 DIAGNOSIS — M4725 Other spondylosis with radiculopathy, thoracolumbar region: Secondary | ICD-10-CM | POA: Diagnosis not present

## 2016-07-21 DIAGNOSIS — Z79891 Long term (current) use of opiate analgesic: Secondary | ICD-10-CM | POA: Diagnosis not present

## 2016-07-21 DIAGNOSIS — M961 Postlaminectomy syndrome, not elsewhere classified: Secondary | ICD-10-CM | POA: Diagnosis not present

## 2016-07-25 ENCOUNTER — Telehealth: Payer: Self-pay | Admitting: Family Medicine

## 2016-07-25 DIAGNOSIS — I10 Essential (primary) hypertension: Secondary | ICD-10-CM

## 2016-07-25 DIAGNOSIS — E559 Vitamin D deficiency, unspecified: Secondary | ICD-10-CM

## 2016-07-25 DIAGNOSIS — Z0000 Encounter for general adult medical examination without abnormal findings: Secondary | ICD-10-CM

## 2016-07-25 DIAGNOSIS — E7800 Pure hypercholesterolemia, unspecified: Secondary | ICD-10-CM

## 2016-07-25 DIAGNOSIS — E039 Hypothyroidism, unspecified: Secondary | ICD-10-CM

## 2016-07-25 DIAGNOSIS — E119 Type 2 diabetes mellitus without complications: Secondary | ICD-10-CM

## 2016-07-25 NOTE — Telephone Encounter (Signed)
-----   Message from Eustace Pen, LPN sent at [id number]  3:10 PM EDT ----- Regarding: Lab Orders for 07/26/2016 Please lab orders for 07/26/2016. Will schedule CPE.

## 2016-07-26 ENCOUNTER — Telehealth: Payer: Self-pay

## 2016-07-26 ENCOUNTER — Ambulatory Visit (INDEPENDENT_AMBULATORY_CARE_PROVIDER_SITE_OTHER): Payer: Medicare Other

## 2016-07-26 VITALS — BP 128/88 | HR 88 | Temp 98.8°F | Ht 61.5 in | Wt 200.8 lb

## 2016-07-26 DIAGNOSIS — E119 Type 2 diabetes mellitus without complications: Secondary | ICD-10-CM | POA: Diagnosis not present

## 2016-07-26 DIAGNOSIS — Z1239 Encounter for other screening for malignant neoplasm of breast: Secondary | ICD-10-CM

## 2016-07-26 DIAGNOSIS — E039 Hypothyroidism, unspecified: Secondary | ICD-10-CM | POA: Diagnosis not present

## 2016-07-26 DIAGNOSIS — Z0000 Encounter for general adult medical examination without abnormal findings: Secondary | ICD-10-CM

## 2016-07-26 DIAGNOSIS — Z23 Encounter for immunization: Secondary | ICD-10-CM

## 2016-07-26 DIAGNOSIS — E2839 Other primary ovarian failure: Secondary | ICD-10-CM

## 2016-07-26 DIAGNOSIS — E7800 Pure hypercholesterolemia, unspecified: Secondary | ICD-10-CM | POA: Diagnosis not present

## 2016-07-26 DIAGNOSIS — I10 Essential (primary) hypertension: Secondary | ICD-10-CM

## 2016-07-26 DIAGNOSIS — E559 Vitamin D deficiency, unspecified: Secondary | ICD-10-CM

## 2016-07-26 LAB — COMPREHENSIVE METABOLIC PANEL
ALK PHOS: 95 U/L (ref 39–117)
ALT: 16 U/L (ref 0–35)
AST: 19 U/L (ref 0–37)
Albumin: 4.1 g/dL (ref 3.5–5.2)
BUN: 21 mg/dL (ref 6–23)
CO2: 31 meq/L (ref 19–32)
Calcium: 9.8 mg/dL (ref 8.4–10.5)
Chloride: 99 mEq/L (ref 96–112)
Creatinine, Ser: 0.99 mg/dL (ref 0.40–1.20)
GFR: 59.56 mL/min — ABNORMAL LOW (ref >60.00)
GLUCOSE: 108 mg/dL — AB (ref 70–99)
POTASSIUM: 5.3 meq/L — AB (ref 3.5–5.1)
SODIUM: 139 meq/L (ref 135–145)
TOTAL PROTEIN: 7.6 g/dL (ref 6.0–8.3)
Total Bilirubin: 0.3 mg/dL (ref 0.2–1.2)

## 2016-07-26 LAB — CBC WITH DIFFERENTIAL/PLATELET
BASOS ABS: 0.1 10*3/uL (ref 0.0–0.1)
Basophils Relative: 0.8 % (ref 0.0–3.0)
EOS PCT: 5.1 % — AB (ref 0.0–5.0)
Eosinophils Absolute: 0.4 10*3/uL (ref 0.0–0.7)
HCT: 43 % (ref 36.0–46.0)
Hemoglobin: 14 g/dL (ref 12.0–15.0)
LYMPHS ABS: 2.6 10*3/uL (ref 0.7–4.0)
Lymphocytes Relative: 31.4 % (ref 12.0–46.0)
MCHC: 32.5 g/dL (ref 30.0–36.0)
MCV: 89 fl (ref 78.0–100.0)
MONO ABS: 1 10*3/uL (ref 0.1–1.0)
Monocytes Relative: 12.3 % — ABNORMAL HIGH (ref 3.0–12.0)
NEUTROS PCT: 50.4 % (ref 43.0–77.0)
Neutro Abs: 4.2 10*3/uL (ref 1.4–7.7)
PLATELETS: 320 10*3/uL (ref 150.0–400.0)
RBC: 4.83 Mil/uL (ref 3.87–5.11)
RDW: 12.9 % (ref 11.5–15.5)
WBC: 8.3 10*3/uL (ref 4.0–10.5)

## 2016-07-26 LAB — LIPID PANEL
Cholesterol: 229 mg/dL — ABNORMAL HIGH (ref 0–200)
HDL: 45.2 mg/dL
NonHDL: 184.25
Total CHOL/HDL Ratio: 5
Triglycerides: 232 mg/dL — ABNORMAL HIGH (ref 0.0–149.0)
VLDL: 46.4 mg/dL — ABNORMAL HIGH (ref 0.0–40.0)

## 2016-07-26 LAB — LDL CHOLESTEROL, DIRECT: Direct LDL: 149 mg/dL

## 2016-07-26 LAB — VITAMIN D 25 HYDROXY (VIT D DEFICIENCY, FRACTURES): VITD: 71.86 ng/mL (ref 30.00–100.00)

## 2016-07-26 LAB — HEMOGLOBIN A1C: Hgb A1c MFr Bld: 6.3 % (ref 4.6–6.5)

## 2016-07-26 LAB — TSH: TSH: 0.55 u[IU]/mL (ref 0.35–4.50)

## 2016-07-26 NOTE — Progress Notes (Signed)
PCP notes:   Health maintenance:  Mammogram- order generated Bone density - order generated A1C - completed Foot exam - if necessary, pt will complete at CPE Eye exam - future appt scheduled  Flu vaccine - administered Hep C screening - per pt, completed in 2013  Abnormal screenings:   Mini-Cog score: 18/20  Patient concerns:   None  Nurse concerns:  None  I reviewed health advisor's note, was available for consultation, and agree with documentation and plan. Loura Pardon MD   Next PCP appt:   08/02/16 @ 0830

## 2016-07-26 NOTE — Telephone Encounter (Signed)
Contacted pt to advise of nurse visit for PPSV23 in Nov 2017.

## 2016-07-26 NOTE — Progress Notes (Signed)
Subjective:   Tina Berger is a 65 y.o. female who presents for an Initial Medicare Annual Wellness Visit.  Review of Systems    N/A Cardiac Risk Factors include: advanced age (>67men, >6 women);diabetes mellitus;dyslipidemia;hypertension;obesity (BMI >30kg/m2)     Objective:    Today's Vitals   07/26/16 1131 07/26/16 1143  BP: 128/88   Pulse: 88   Temp: 98.8 F (37.1 C)   TempSrc: Oral   SpO2: 98%   Weight: 200 lb 12 oz (91.1 kg)   Height: 5' 1.5" (1.562 m)   PainSc: 4  4   PainLoc: Back    Body mass index is 37.32 kg/m.   Current Medications (verified) Outpatient Encounter Prescriptions as of 07/26/2016  Medication Sig  . buPROPion (WELLBUTRIN XL) 150 MG 24 hr tablet Take 1 tablet (150 mg total) by mouth daily.  . Chlorphen-Phenyleph-ASA (ALKA-SELTZER PLUS COLD PO) Take by mouth as needed.    . cyclobenzaprine (FLEXERIL) 10 MG tablet TAKE ONE-HALF TO ONE TABLET BY MOUTH ONCE DAILY AND TAKE ONE TABLET BY MOUTH AT BEDTIME  . Diclofenac Sodium 1.5 % SOLN Place 2 mLs onto the skin 4 (four) times daily.  Marland Kitchen DIGESTIVE ENZYMES PO Take by mouth daily.   Marland Kitchen diltiazem (CARDIZEM LA) 120 MG 24 hr tablet Take 1 tablet (120 mg total) by mouth daily. As needed for raynaud's symptoms  . diltiazem (CARDIZEM) 30 MG tablet Take 1 tablet (30 mg total) by mouth 3 (three) times daily as needed.  . diphenhydrAMINE (BENADRYL) 25 mg capsule Take 25 mg by mouth as needed.    . DULoxetine (CYMBALTA) 60 MG capsule Take 1 capsule (60 mg total) by mouth daily.  Marland Kitchen esomeprazole (NEXIUM) 20 MG capsule TAKE 1 CAPSULE TWICE A DAY  . glucose blood test strip One Touch Ultra stripts blue-To check sugar once daily and as needed for DM2 250.00  . GUAIFENESIN CR PO Take by mouth as needed.    Marland Kitchen HYDROmorphone (DILAUDID) 8 MG tablet Take 16 mg by mouth every 4 (four) hours as needed for severe pain.  . hyoscyamine (LEVSIN SL) 0.125 MG SL tablet Take 1 tablet (0.125 mg total) by mouth every 4 (four) hours as  needed for cramping.  Marland Kitchen KRILL OIL PO Take by mouth daily.  Marland Kitchen levofloxacin (LEVAQUIN) 500 MG tablet Take 500 mg by mouth as needed.  Marland Kitchen levothyroxine (SYNTHROID, LEVOTHROID) 150 MCG tablet Take 1 tablet (150 mcg total) by mouth daily.  . Magnesium 400 MG CAPS Take by mouth daily.   . metFORMIN (GLUCOPHAGE) 500 MG tablet Take 1 tablet (500 mg total) by mouth 2 (two) times daily with a meal.  . metoprolol succinate (TOPROL-XL) 50 MG 24 hr tablet Take 1 tablet (50 mg total) by mouth 2 (two) times daily.  Marland Kitchen oxyCODONE (OXYCONTIN) 10 MG 12 hr tablet Take 10 mg by mouth 2 (two) times daily.   . ramipril (ALTACE) 5 MG capsule Take 1 capsule (5 mg total) by mouth daily.  . ranitidine (ZANTAC) 300 MG tablet Take 1 tablet (300 mg total) by mouth at bedtime.  . torsemide (DEMADEX) 20 MG tablet Take 1 tablet by mouth two  times daily as needed  . [DISCONTINUED] oxymorphone (OPANA ER) 20 MG 12 hr tablet Take 20 mg by mouth every 12 (twelve) hours.   No facility-administered encounter medications on file as of 07/26/2016.     Allergies (verified) Keflex [cephalexin]; Penicillins; Amitriptyline hcl; Atorvastatin; Benicar [olmesartan medoxomil]; Ceftin [cefuroxime]; Cetirizine hcl; Ciprofloxacin; Clonidine derivatives;  Diltiazem hcl; Diovan [valsartan]; Erythromycin; Etodolac; Fluoxetine hcl; Furosemide; Gabapentin; Naproxen sodium; Paroxetine; Pregabalin; Sulfonamide derivatives; Tetracycline; Venlafaxine; and Cephalexin   History: Past Medical History:  Diagnosis Date  . Anemia, iron deficiency   . Bilateral lower extremity edema    a. uses torsemide  . Cervical dysplasia    abnormal paps  . Diabetes mellitus type II    Diet controlled  . Diverticulosis   . DJD (degenerative joint disease)   . Dry eyes   . Edema   . Fibromyalgia   . GERD (gastroesophageal reflux disease)   . Hemorrhoids    external  . HLD (hyperlipidemia)   . HNP (herniated nucleus pulposus) 2/99   T6,7,8 with DJD  . HTN  (hypertension)   . Hypothyroidism   . Interstitial lung disease ([place])    ?  Marland Kitchen Left ovarian cyst    x 3, rupture  . Osteoarthritis    hands  . Osteopenia    mild-11/01; improved 12/05  . Palpitations   . Raynaud's disease   . Recurrent HSV (herpes simplex virus)    lesions in nose   Past Surgical History:  Procedure Laterality Date  . COLONOSCOPY  11/01   Diverticulosis; hemorrhoids  . HAND SURGERY     left thumb  . LASIK     bilateral   Family History  Problem Relation Age of Onset  . Emphysema Father     + smoker  . Lung cancer Mother     + smoker  . Coronary artery disease Mother     relatively young  . Lymphoma Brother   . Lymphoma Sister   . Diabetes Brother   . Diabetes Sister   . Heart disease Brother   . Anemia Brother     aplactic    Social History   Occupational History  . 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 Smoking status: Former Smoker    Types: Cigarettes  . Smokeless tobacco: Never Used     Comment: quit over 40 years  . Alcohol use No  . Drug use: No  . Sexual activity: Yes    Tobacco Counseling Counseling given: No   Activities of Daily Living In your present state of health, do you have any difficulty performing the following activities: 07/26/2016  Hearing? N  Vision? N  Difficulty concentrating or making decisions? N  Walking or climbing stairs? Y  Dressing or bathing? N  Doing errands, shopping? N  Preparing Food and eating ? N  Using the Toilet? N  In the past six months, have you accidently leaked urine? Y  Do you have problems with loss of bowel control? N  Managing your Medications? N  Managing your Finances? N  Housekeeping or managing your Housekeeping? N  Some recent data might be hidden    Immunizations and Health Maintenance Immunization History  Administered Date(s) Administered  . Influenza Split 06/19/2011  . Influenza Whole 08/01/2009  . Influenza,inj,Quad PF,36+ Mos  06/14/2014, 08/25/2015, 07/26/2016  . Pneumococcal Conjugate-13 08/25/2015  . Td 11/25/2003  . Tdap 06/14/2014  . Zoster 09/16/2014   There are no preventive care reminders to display for this patient.  Patient Care Team: Abner Greenspan, MD as PCP - [place], MD as Consulting Physician (Cardiology)     Assessment:   This is a routine wellness examination for Tina Berger.   Hearing/Vision screen  Hearing Screening   125Hz  250Hz  500Hz  1000Hz  2000Hz  3000Hz   4000Hz  6000Hz  8000Hz   Right ear:   40 40 40  40    Left ear:   40 40 40  40      Visual Acuity Screening   Right eye Left eye Both eyes  Without correction: 20/50 20/70 20/40   With correction:       Dietary issues and exercise activities discussed: Current Exercise Habits: Home exercise routine, Type of exercise: walking, Time (Minutes): 30, Frequency (Times/Week): 3, Weekly Exercise (Minutes/Week): 90, Intensity: Moderate, Exercise limited by: None identified  Goals    . Increase physical activity          Starting 07/26/2016, I will continue to exercise at least 30 min 3 days per week.       Depression Screen PHQ 2/9 Scores 07/26/2016  PHQ - 2 Score 0    Fall Risk Fall Risk  07/26/2016  Falls in the past year? No    Cognitive Function: MMSE - Mini Mental State Exam 07/26/2016  Orientation to time 5  Orientation to Place 5  Registration 3  Attention/ Calculation 0  Recall 1  Language- name 2 objects 0  Language- repeat 1  Language- follow 3 step command 3  Language- read & follow direction 0  Write a sentence 0  Copy design 0  Total score 18   PLEASE NOTE: A Mini-Cog screen was completed. Maximum score is 20. A value of 0 denotes this part of [place] MMSE was not completed or the patient failed this part of the Mini-Cog screening.   Mini-Cog Screening Orientation to Time - Max 5 pts Orientation to Place - Max 5 pts Registration - Max 3 pts Recall - Max 3 pts Language Repeat - Max 1  pts Language Follow 3 Step Command - Max 3 pts   Screening Tests Health Maintenance  Topic Date Due  . FOOT EXAM  10/17/2016 (Originally 03/19/2011)  . MAMMOGRAM  10/17/2016 (Originally 02/06/2016)  . OPHTHALMOLOGY EXAM  10/17/2016 (Originally 02/16/2012)  . PNA vac Low Risk Adult (2 of 2 - PPSV23) 08/24/2016  . HEMOGLOBIN A1C  01/24/2017  . COLONOSCOPY  12/27/2019  . TETANUS/TDAP  06/14/2024  . INFLUENZA VACCINE  Addressed  . DEXA SCAN  Completed  . ZOSTAVAX  Completed  . Hepatitis C Screening  Addressed      Plan:     I have personally reviewed and addressed the Medicare Annual Wellness questionnaire and have noted the following in the patient's chart:  A. Medical and social history B. Use of alcohol, tobacco or illicit drugs  C. Current medications and supplements D. Functional ability and status E.  Nutritional status F.  Physical activity G. Advance directives H. List of other physicians I.  Hospitalizations, surgeries, and ER visits in previous 12 months J.  [place] to include hearing, vision, cognitive, depression L. Referrals and appointments - none  In addition, I have reviewed and discussed with patient certain preventive protocols, quality metrics, and best practice recommendations. A written personalized care plan for preventive services as well as general preventive health recommendations were provided to patient.  See attached scanned questionnaire for additional information.   Signed,   Lindell Noe, MHA, BS, LPN Health Advisor

## 2016-07-26 NOTE — Patient Instructions (Signed)
Tina Berger , Thank you for taking time to come for your Medicare Wellness Visit. I appreciate your ongoing commitment to your health goals. Please review the following plan we discussed and let me know if I can assist you in the future.   These are the goals we discussed: Goals    . Increase physical activity          Starting 07/26/2016, I will continue to exercise at least 30 min 3 days per week.        This is a list of the screening recommended for you and due dates:  Health Maintenance  Topic Date Due  . Complete foot exam   10/17/2016*  . Mammogram  10/17/2016*  . Eye exam for diabetics  10/17/2016*  . Pneumonia vaccines (2 of 2 - PPSV23) 08/24/2016  . Hemoglobin A1C  01/24/2017  . Colon Cancer Screening  12/27/2019  . Tetanus Vaccine  06/14/2024  . Flu Shot  Addressed  . DEXA scan (bone density measurement)  Completed  . Shingles Vaccine  Completed  .  Hepatitis C: One time screening is recommended by Center for Disease Control  (CDC) for  adults born from [birth year] through 1966.   Addressed  *Topic was postponed. The date shown is not the original due date.   Preventive Care for Adults  A healthy lifestyle and preventive care can promote health and wellness. Preventive health guidelines for adults include the following key practices.  . A routine yearly physical is a good way to check with your health care provider about your health and preventive screening. It is a chance to share any concerns and updates on your health and to receive a thorough exam.  . Visit your dentist for a routine exam and preventive care every 6 months. Brush your teeth twice a day and floss once a day. Good oral hygiene prevents tooth decay and gum disease.  . The frequency of eye exams is based on your age, health, family medical history, use  of contact lenses, and other factors. Follow your health care provider's ecommendations for frequency of eye exams.  . Eat a healthy diet. Foods like  vegetables, fruits, whole grains, low-fat dairy products, and lean protein foods contain the nutrients you need without too many calories. Decrease your intake of foods high in solid fats, added sugars, and salt. Eat the right amount of calories for you. Get information about a proper diet from your health care provider, if necessary.  . Regular physical exercise is one of the most important things you can do for your health. Most adults should get at least 150 minutes of moderate-intensity exercise (any activity that increases your heart rate and causes you to sweat) each week. In addition, most adults need muscle-strengthening exercises on 2 or more days a week.  Silver Sneakers may be a benefit available to you. To determine eligibility, you may visit the website: www.silversneakers.com or contact program at 281-767-0699 Mon-Fri between 8AM-8PM.   . Maintain a healthy weight. The body mass index (BMI) is a screening tool to identify possible weight problems. It provides an estimate of body fat based on height and weight. Your health care provider can find your BMI and can help you achieve or maintain a healthy weight.   For adults 20 years and older: ? A BMI below 18.5 is considered underweight. ? A BMI of 18.5 to 24.9 is normal. ? A BMI of 25 to 29.9 is considered overweight. ? A BMI of 30  and above is considered obese.   . Maintain normal blood lipids and cholesterol levels by exercising and minimizing your intake of saturated fat. Eat a balanced diet with plenty of fruit and vegetables. Blood tests for lipids and cholesterol should begin at age 2 and be repeated every 5 years. If your lipid or cholesterol levels are high, you are over 50, or you are at high risk for heart disease, you may need your cholesterol levels checked more frequently. Ongoing high lipid and cholesterol levels should be treated with medicines if diet and exercise are not working.  . If you smoke, find out from your  health care provider how to quit. If you do not use tobacco, please do not start.  . If you choose to drink alcohol, please do not consume more than 2 drinks per day. One drink is considered to be 12 ounces (355 mL) of beer, 5 ounces (148 mL) of wine, or 1.5 ounces (44 mL) of liquor.  . If you are 42-98 years old, ask your health care provider if you should take aspirin to prevent strokes.  . Use sunscreen. Apply sunscreen liberally and repeatedly throughout the day. You should seek shade when your shadow is shorter than you. Protect yourself by wearing long sleeves, pants, a wide-brimmed hat, and sunglasses year round, whenever you are outdoors.  . Once a month, do a whole body skin exam, using a mirror to look at the skin on your back. Tell your health care provider of new moles, moles that have irregular borders, moles that are larger than a pencil eraser, or moles that have changed in shape or color.

## 2016-07-26 NOTE — Progress Notes (Signed)
Pre visit review using our clinic review tool, if applicable. No additional management support is needed unless otherwise documented below in the visit note. 

## 2016-08-02 ENCOUNTER — Ambulatory Visit: Payer: Medicare Other | Admitting: Family Medicine

## 2016-08-09 ENCOUNTER — Encounter: Payer: Self-pay | Admitting: Family Medicine

## 2016-08-09 ENCOUNTER — Ambulatory Visit (INDEPENDENT_AMBULATORY_CARE_PROVIDER_SITE_OTHER): Payer: Medicare Other | Admitting: Family Medicine

## 2016-08-09 VITALS — BP 122/74 | HR 101 | Temp 99.0°F | Ht 61.5 in | Wt 197.8 lb

## 2016-08-09 DIAGNOSIS — E119 Type 2 diabetes mellitus without complications: Secondary | ICD-10-CM

## 2016-08-09 DIAGNOSIS — Z0000 Encounter for general adult medical examination without abnormal findings: Secondary | ICD-10-CM

## 2016-08-09 DIAGNOSIS — I10 Essential (primary) hypertension: Secondary | ICD-10-CM

## 2016-08-09 DIAGNOSIS — IMO0001 Reserved for inherently not codable concepts without codable children: Secondary | ICD-10-CM

## 2016-08-09 DIAGNOSIS — J849 Interstitial pulmonary disease, unspecified: Secondary | ICD-10-CM

## 2016-08-09 DIAGNOSIS — Z1211 Encounter for screening for malignant neoplasm of colon: Secondary | ICD-10-CM

## 2016-08-09 DIAGNOSIS — E7800 Pure hypercholesterolemia, unspecified: Secondary | ICD-10-CM | POA: Diagnosis not present

## 2016-08-09 DIAGNOSIS — E039 Hypothyroidism, unspecified: Secondary | ICD-10-CM

## 2016-08-09 DIAGNOSIS — Z6836 Body mass index (BMI) 36.0-36.9, adult: Secondary | ICD-10-CM

## 2016-08-09 DIAGNOSIS — E875 Hyperkalemia: Secondary | ICD-10-CM

## 2016-08-09 DIAGNOSIS — E6609 Other obesity due to excess calories: Secondary | ICD-10-CM

## 2016-08-09 DIAGNOSIS — E559 Vitamin D deficiency, unspecified: Secondary | ICD-10-CM

## 2016-08-09 NOTE — Progress Notes (Signed)
Subjective:    Patient ID: Tina Berger, female    DOB: 1951-06-17, 65 y.o.   MRN: [id number]  HPI Here for health maintenance exam and to review chronic medical problems    Saw Katha Cabal for her AMW visit on 07/26/16 Orders were generated for mammogram and dexa  Future appt scheduled for eye exam  Mini cog score 18/20 She is not concerned about any memory problems- she blames it on fibromyalgia    Wt Readings from Last 3 Encounters:  08/09/16 197 lb 12 oz (89.7 kg)  07/26/16 200 lb 12 oz (91.1 kg)  09/29/15 208 lb (94.3 kg)  she is working on loosing weight  Eating less meat -trying to get protein in other ways  Also more physical activity-even in 10 minute intervals  bmi is 36.7  Mammogram 4/16-normal- did schedule that  Self breast exam - no lumps or changes   Due for pneumonia vaccine (23) after 11/7- she has it scheduled for 11/8   Colonoscopy 3/11- diverticulosis  9/15 FOBT-negative  She is interested in the cologuard program   dexa 9/13-osteopenia - dexa is scheduled  No falls or fractures in the past year  Went to PT to work on balance  D level is 71  Neg hep C screen 1/13  K is 5.3 (high)- ? Could be from ace inhibitor   bp is stable today  No cp or palpitations or headaches or edema  No side effects to medicines  BP Readings from Last 3 Encounters:  08/09/16 122/74  07/26/16 128/88  09/29/15 138/90      DM2 Lab Results  Component Value Date   HGBA1C 6.3 07/26/2016  very stable  Has eye exam planned A little more bread lately    Hypothyroidism  Pt has no clinical changes No change in energy level/ hair or skin/ edema and no tremor Lab Results  Component Value Date   TSH 0.55 07/26/2016     Hx of hyperlipidemia Lab Results  Component Value Date   CHOL 229 (H) 07/26/2016   CHOL 224 (H) 08/25/2015   CHOL 210 (H) 06/11/2014   Lab Results  Component Value Date   HDL 45.20 07/26/2016   HDL 46.80 08/25/2015   HDL 49.40 06/11/2014   Lab  Results  Component Value Date   LDLCALC 126 (H) 06/11/2014   LDLCALC 127 (H) 01/10/2012   LDLCALC 122 (H) 03/25/2008   Lab Results  Component Value Date   TRIG 232.0 (H) 07/26/2016   TRIG 201.0 (H) 08/25/2015   TRIG 174.0 (H) 06/11/2014   Lab Results  Component Value Date   CHOLHDL 5 07/26/2016   CHOLHDL 5 08/25/2015   CHOLHDL 4 06/11/2014   Lab Results  Component Value Date   LDLDIRECT 149.0 07/26/2016   LDLDIRECT 136.0 08/25/2015   LDLDIRECT 135.6 10/08/2011  cannot take statins due to muscle pain - Dr [name] may start her on a "baby dose" of a statin  Trig up from more sugar /carbs    Patient Active Problem List   Diagnosis Date Noted  . Leg edema 09/29/2015  . Sore throat 06/16/2014  . DM type 2 (diabetes mellitus, type 2) ([place]) 06/14/2014  . Encounter for routine gynecological examination 06/14/2014  . Colon cancer screening 06/14/2014  . Acute renal insufficiency 05/31/2014  . Bilateral lower extremity edema   . Cough 08/08/2012  . Steroid long-term use 06/23/2012  . Post-menopausal 06/23/2012  . Interstitial lung disease ([place]) 03/21/2012  . Elevated liver enzymes  03/21/2012  . Rhinitis 03/21/2012  . Pedal edema 01/28/2012  . Degenerative disk disease 11/19/2011  . Lumbar disc disease with radiculopathy 11/18/2011  . Other screening mammogram 08/18/2011  . HTN (hypertension) 07/30/2011  . Raynaud disease 07/30/2011  . Obesity 07/30/2011  . Drug rash 05/22/2011  . Hyperkalemia 04/07/2011  . Routine general medical examination at a health care facility 03/28/2011  . PALPITATIONS 04/27/2010  . PERSONAL HISTORY ALLERGY UNSPEC MEDICINAL AGENT 03/18/2010  . SOMATIC DYSFUNCTION 12/11/2009  . DEPRESSION 06/14/2008  . Hyperglycemia 05/13/2008  . Vitamin D deficiency 05/13/2008  . ANXIETY 03/25/2008  . OSTEOPENIA 03/25/2008  . Hypothyroidism 03/28/2007  . Hyperlipidemia 03/28/2007  . Other iron deficiency anemias 03/28/2007  . PANIC ATTACK 03/28/2007  .  KERATOCONJUNCTIVITIS SICCA 03/28/2007  . Mitral valve disorder 03/28/2007  . ABNORMAL HEART RHYTHMS 03/28/2007  . RAYNAUD'S SYNDROME 03/28/2007  . ESOPHAGITIS 03/28/2007  . GERD 03/28/2007  . GASTRITIS 03/28/2007  . IBS 03/28/2007  . ROSACEA 03/28/2007  . OSTEOARTHRITIS 03/28/2007  . BACK PAIN 03/28/2007  . PLANTAR FASCIITIS 03/28/2007  . FIBROMYALGIA 03/28/2007  . MIGRAINES, HX OF 03/28/2007   Past Medical History:  Diagnosis Date  . Anemia, iron deficiency   . Bilateral lower extremity edema    a. uses torsemide  . Cervical dysplasia    abnormal paps  . Diabetes mellitus type II    Diet controlled  . Diverticulosis   . DJD (degenerative joint disease)   . Dry eyes   . Edema   . Fibromyalgia   . GERD (gastroesophageal reflux disease)   . Hemorrhoids    external  . HLD (hyperlipidemia)   . HNP (herniated nucleus pulposus) 2/99   T6,7,8 with DJD  . HTN (hypertension)   . Hypothyroidism   . Interstitial lung disease ([place])    ?  Marland Kitchen Left ovarian cyst    x 3, rupture  . Osteoarthritis    hands  . Osteopenia    mild-11/01; improved 12/05  . Palpitations   . Raynaud's disease   . Recurrent HSV (herpes simplex virus)    lesions in nose   Past Surgical History:  Procedure Laterality Date  . COLONOSCOPY  11/01   Diverticulosis; hemorrhoids  . HAND SURGERY     left thumb  . LASIK     bilateral   Social History  Substance Use Topics  . Smoking status: Former Smoker    Types: Cigarettes  . Smokeless tobacco: Never Used     Comment: quit over 40 years  . Alcohol use No   Family History  Problem Relation Age of Onset  . Emphysema Father     + smoker  . Lung cancer Mother     + smoker  . Coronary artery disease Mother     relatively young  . Lymphoma Brother   . Lymphoma Sister   . Diabetes Brother   . Diabetes Sister   . Heart disease Brother   . Anemia Brother     aplactic    Allergies  Allergen Reactions  . Keflex [Cephalexin] Anaphylaxis  .  Penicillins Anaphylaxis  . Amitriptyline Hcl     REACTION: sedating  . Atorvastatin     REACTION: muscle  . Benicar [Olmesartan Medoxomil]     Muscle pain   . Ceftin [Cefuroxime]     Swelling, "legs turn blue"  . Cetirizine Hcl     REACTION: headache  . Ciprofloxacin     REACTION: ? rash vs sun rxn  . Clonidine  Derivatives     Swelling   . Diltiazem Hcl     REACTION: reaction not known  . Diovan [Valsartan]     Thought it made her feel confused  . Erythromycin     Rash, swollen gums   . Etodolac     REACTION: reaction not known  . Fluoxetine Hcl     REACTION: stomach problems  . Furosemide     REACTION: swelling  . Gabapentin     REACTION: edema  . Naproxen Sodium     REACTION: edema  . Paroxetine     REACTION: weight gain  . Pregabalin     REACTION: swelling  . Sulfonamide Derivatives     Rash, swollen gums, lips  . Tetracycline     REACTION:inflammed genitals  . Venlafaxine     REACTION: sweating  . Cephalexin Hives and Rash   Current Outpatient Prescriptions on File Prior to Visit  Medication Sig Dispense Refill  . buPROPion (WELLBUTRIN XL) 150 MG 24 hr tablet Take 1 tablet (150 mg total) by mouth daily. 90 tablet 3  . Chlorphen-Phenyleph-ASA (ALKA-SELTZER PLUS COLD PO) Take by mouth as needed.      . cyclobenzaprine (FLEXERIL) 10 MG tablet TAKE ONE-HALF TO ONE TABLET BY MOUTH ONCE DAILY AND TAKE ONE TABLET BY MOUTH AT BEDTIME 180 tablet 3  . Diclofenac Sodium 1.5 % SOLN Place 2 mLs onto the skin 4 (four) times daily. 3 Bottle 3  . DIGESTIVE ENZYMES PO Take by mouth daily.     Marland Kitchen diltiazem (CARDIZEM LA) 120 MG 24 hr tablet Take 1 tablet (120 mg total) by mouth daily. As needed for raynaud's symptoms 30 tablet 3  . diltiazem (CARDIZEM) 30 MG tablet Take 1 tablet (30 mg total) by mouth 3 (three) times daily as needed. 90 tablet 6  . diphenhydrAMINE (BENADRYL) 25 mg capsule Take 25 mg by mouth as needed.      . DULoxetine (CYMBALTA) 60 MG capsule Take 1 capsule  (60 mg total) by mouth daily. 90 capsule 3  . esomeprazole (NEXIUM) 20 MG capsule TAKE 1 CAPSULE TWICE A DAY 180 capsule 3  . glucose blood test strip One Touch Ultra stripts blue-To check sugar once daily and as needed for DM2 250.00 100 each 3  . GUAIFENESIN CR PO Take by mouth as needed.      Marland Kitchen HYDROmorphone (DILAUDID) 8 MG tablet Take 16 mg by mouth every 4 (four) hours as needed for severe pain.    . hyoscyamine (LEVSIN SL) 0.125 MG SL tablet Take 1 tablet (0.125 mg total) by mouth every 4 (four) hours as needed for cramping. 270 tablet 0  . KRILL OIL PO Take by mouth daily.    Marland Kitchen levothyroxine (SYNTHROID, LEVOTHROID) 150 MCG tablet Take 1 tablet (150 mcg total) by mouth daily. 90 tablet 3  . Magnesium 400 MG CAPS Take by mouth daily.     . metFORMIN (GLUCOPHAGE) 500 MG tablet Take 1 tablet (500 mg total) by mouth 2 (two) times daily with a meal. 180 tablet 3  . metoprolol succinate (TOPROL-XL) 50 MG 24 hr tablet Take 1 tablet (50 mg total) by mouth 2 (two) times daily. 180 tablet 3  . oxyCODONE (OXYCONTIN) 10 MG 12 hr tablet Take 10 mg by mouth 2 (two) times daily.     . ramipril (ALTACE) 5 MG capsule Take 1 capsule (5 mg total) by mouth daily. 90 capsule 3  . ranitidine (ZANTAC) 300 MG tablet Take 1 tablet (300  mg total) by mouth at bedtime. 90 tablet 3  . torsemide (DEMADEX) 20 MG tablet Take 1 tablet by mouth two  times daily as needed 180 tablet 0   No current facility-administered medications on file prior to visit.     Review of Systems Review of Systems  Constitutional: Negative for fever, appetite change, and unexpected weight change.  Eyes: Negative for pain and visual disturbance.  ENT - grinds teeth in response to stress  Respiratory: Negative for cough and shortness of breath.   Cardiovascular: Negative for cp or palpitations   pos for reynaud's symptoms in hands and feet  Gastrointestinal: Negative for nausea, diarrhea and constipation.  Genitourinary: Negative for urgency  and frequency.  Skin: Negative for pallor or rash   [place] pos for myofasical pain  Neurological: Negative for weakness, light-headedness, numbness and headaches.  Hematological: Negative for adenopathy. Does not bruise/bleed easily.  Psychiatric/Behavioral: Negative for dysphoric mood. The patient is not nervous/anxious.         Objective:   Physical Exam  Constitutional: She appears well-developed and well-nourished. No distress.  obese and well appearing   HENT:  Head: Normocephalic and atraumatic.  Right Ear: External ear normal.  Left Ear: External ear normal.  Mouth/Throat: Oropharynx is clear and moist.  Eyes: Conjunctivae and EOM are normal. Pupils are equal, round, and reactive to light. No scleral icterus.  Neck: Normal range of motion. Neck supple. No JVD present. Carotid bruit is not present. No thyromegaly present.  Cardiovascular: Normal rate, regular rhythm, normal heart sounds and intact distal pulses.  Exam reveals no gallop.   Baseline puffiness in post hands and feet with rubor in fingers and toes (baseline)  Nl perfusion   Pulmonary/Chest: Effort normal and breath sounds normal. No respiratory distress. She has no wheezes. She exhibits no tenderness.  Abdominal: Soft. Bowel sounds are normal. She exhibits no distension, no abdominal bruit and no mass. There is no tenderness.  Genitourinary: No breast swelling, tenderness, discharge or bleeding.  Genitourinary Comments: Breast exam: No mass, nodules, thickening, tenderness, bulging, retraction, inflamation, nipple discharge or skin changes noted.  No axillary or clavicular LA.      Musculoskeletal: Normal range of motion. She exhibits no edema or tenderness.  No kyphosis   Lymphadenopathy:    She has no cervical adenopathy.  Neurological: She is alert. She has normal reflexes. No cranial nerve deficit. She exhibits normal muscle tone. Coordination normal.  Skin: Skin is warm and dry. No rash noted. No erythema. No  pallor.  Solar lentigines diffusely  Skin color change in fingers and toes are baseline   Psychiatric: She has a normal mood and affect.  Pt becomes emotional when disc the teeth grinding problem           Assessment & Plan:   Problem List Items Addressed This Visit      Cardiovascular and Mediastinum   HTN (hypertension) - Primary    bp in fair control at this time  BP Readings from Last 1 Encounters:  08/09/16 122/74   No changes needed Disc lifstyle change with low sodium diet and exercise  Labs reviewed  ? If ace is causing high K Disc imp of fluid intake  Will re check this soon         Respiratory   Interstitial lung disease ([place])    No respiratory c/o today        Endocrine   DM type 2 (diabetes mellitus, type 2) ([place])  Lab Results  Component Value Date   HGBA1C 6.3 07/26/2016   This is stable  She has eye exam planned  Enc low glycemic diet and exercise      Hypothyroidism    Hypothyroidism  Pt has no clinical changes No change in energy level/ hair or skin/ edema and no tremor Lab Results  Component Value Date   TSH 0.55 07/26/2016            Other   Colon cancer screening    Referred for cologuard program  Colonoscopy rev 3/11-no recall       Hyperkalemia    Lab Results  Component Value Date   K 5.3 (H) 07/26/2016   Pt will stop any vitamins with K She is on ace If no imp in 1-2 wk may need to hold ace       Hyperlipidemia    Disc goals for lipids and reasons to control them Rev labs with pt Rev low sat fat diet in detail Not at goal  Unfortunately pt cannot tolerate statins  Will work on habits       Obesity    Discussed how this problem influences overall health and the risks it imposes  Reviewed plan for weight loss with lower calorie diet (via better food choices and also portion control or program like weight watchers) and exercise building up to or more than 30 minutes 5 days per week including some aerobic  activity         Routine general medical examination at a health care facility    Reviewed health habits including diet and exercise and skin cancer prevention Reviewed appropriate screening tests for age  Also reviewed health mt list, fam hx and immunization status , as well as social and family history   See HPI AMW reviewed Labs reviewed Your potassium level is slightly high- it could be increased by your rampril however do look at your vitamins - stop any potassium you may be taking  You can talk to your cardiologist about this as well  Be mindful of sugar and carbs for diabetes and triglycerides  Cholesterol   Avoid red meat/ fried foods/ egg yolks/ fatty breakfast meats/ butter, cheese and high fat dairy/ and shellfish   Keep working on exercise -this helps general health and stress       Vitamin D deficiency    Vitamin D level is therapeutic with current supplementation Disc importance of this to bone and overall health        Other Visit Diagnoses   None.

## 2016-08-09 NOTE — Assessment & Plan Note (Signed)
Vitamin D level is therapeutic with current supplementation Disc importance of this to bone and overall health  

## 2016-08-09 NOTE — Progress Notes (Signed)
Pre visit review using our clinic review tool, if applicable. No additional management support is needed unless otherwise documented below in the visit note. 

## 2016-08-09 NOTE — Assessment & Plan Note (Signed)
Disc goals for lipids and reasons to control them Rev labs with pt Rev low sat fat diet in detail Not at goal  Unfortunately pt cannot tolerate statins  Will work on habits

## 2016-08-09 NOTE — Assessment & Plan Note (Signed)
No respiratory c/o today

## 2016-08-09 NOTE — Assessment & Plan Note (Signed)
Reviewed health habits including diet and exercise and skin cancer prevention Reviewed appropriate screening tests for age  Also reviewed health mt list, fam hx and immunization status , as well as social and family history   See HPI AMW reviewed Labs reviewed Your potassium level is slightly high- it could be increased by your rampril however do look at your vitamins - stop any potassium you may be taking  You can talk to your cardiologist about this as well  Be mindful of sugar and carbs for diabetes and triglycerides  Cholesterol   Avoid red meat/ fried foods/ egg yolks/ fatty breakfast meats/ butter, cheese and high fat dairy/ and shellfish   Keep working on exercise -this helps general health and stress

## 2016-08-09 NOTE — Assessment & Plan Note (Signed)
Referred for cologuard program  Colonoscopy rev 3/11-no recall

## 2016-08-09 NOTE — Assessment & Plan Note (Signed)
Hypothyroidism  Pt has no clinical changes No change in energy level/ hair or skin/ edema and no tremor Lab Results  Component Value Date   TSH 0.55 07/26/2016

## 2016-08-09 NOTE — Patient Instructions (Addendum)
Your potassium level is slightly high- it could be increased by your rampril however do look at your vitamins - stop any potassium you may be taking  You can talk to your cardiologist about this as well  Be mindful of sugar and carbs for diabetes and triglycerides  Cholesterol   Avoid red meat/ fried foods/ egg yolks/ fatty breakfast meats/ butter, cheese and high fat dairy/ and shellfish   Keep working on exercise -this helps general health and stress

## 2016-08-09 NOTE — Assessment & Plan Note (Signed)
Lab Results  Component Value Date   K 5.3 (H) 07/26/2016   Pt will stop any vitamins with K She is on ace If no imp in 1-2 wk may need to hold ace

## 2016-08-09 NOTE — Assessment & Plan Note (Signed)
bp in fair control at this time  BP Readings from Last 1 Encounters:  08/09/16 122/74   No changes needed Disc lifstyle change with low sodium diet and exercise  Labs reviewed  ? If ace is causing high K Disc imp of fluid intake  Will re check this soon

## 2016-08-09 NOTE — Assessment & Plan Note (Signed)
Discussed how this problem influences overall health and the risks it imposes  Reviewed plan for weight loss with lower calorie diet (via better food choices and also portion control or program like weight watchers) and exercise building up to or more than 30 minutes 5 days per week including some aerobic activity    

## 2016-08-09 NOTE — Assessment & Plan Note (Signed)
Lab Results  Component Value Date   HGBA1C 6.3 07/26/2016   This is stable  She has eye exam planned  Enc low glycemic diet and exercise

## 2016-08-24 DIAGNOSIS — H40003 Preglaucoma, unspecified, bilateral: Secondary | ICD-10-CM | POA: Diagnosis not present

## 2016-08-24 LAB — HM DIABETES EYE EXAM

## 2016-08-25 ENCOUNTER — Other Ambulatory Visit (INDEPENDENT_AMBULATORY_CARE_PROVIDER_SITE_OTHER): Payer: Medicare Other

## 2016-08-25 ENCOUNTER — Ambulatory Visit (INDEPENDENT_AMBULATORY_CARE_PROVIDER_SITE_OTHER): Payer: Medicare Other

## 2016-08-25 DIAGNOSIS — E875 Hyperkalemia: Secondary | ICD-10-CM

## 2016-08-25 DIAGNOSIS — Z23 Encounter for immunization: Secondary | ICD-10-CM | POA: Diagnosis not present

## 2016-08-25 LAB — BASIC METABOLIC PANEL
BUN: 25 mg/dL — AB (ref 6–23)
CHLORIDE: 97 meq/L (ref 96–112)
CO2: 36 mEq/L — ABNORMAL HIGH (ref 19–32)
Calcium: 9.5 mg/dL (ref 8.4–10.5)
Creatinine, Ser: 0.96 mg/dL (ref 0.40–1.20)
GFR: 61.7 mL/min (ref >60.00)
Glucose, Bld: 76 mg/dL (ref 70–99)
POTASSIUM: 4.6 meq/L (ref 3.5–5.1)
SODIUM: 138 meq/L (ref 135–145)

## 2016-08-26 DIAGNOSIS — Z1212 Encounter for screening for malignant neoplasm of rectum: Secondary | ICD-10-CM | POA: Diagnosis not present

## 2016-08-26 DIAGNOSIS — Z1211 Encounter for screening for malignant neoplasm of colon: Secondary | ICD-10-CM | POA: Diagnosis not present

## 2016-09-03 LAB — COLOGUARD: Cologuard: NEGATIVE

## 2016-09-08 ENCOUNTER — Encounter: Payer: Self-pay | Admitting: *Deleted

## 2016-09-14 ENCOUNTER — Ambulatory Visit
Admission: RE | Admit: 2016-09-14 | Discharge: 2016-09-14 | Disposition: A | Discharge status: Home / Self Care | Payer: Medicare Other | Source: Ambulatory Visit | Attending: Family Medicine | Admitting: Family Medicine

## 2016-09-14 DIAGNOSIS — Z1239 Encounter for other screening for malignant neoplasm of breast: Secondary | ICD-10-CM

## 2016-09-14 DIAGNOSIS — E2839 Other primary ovarian failure: Secondary | ICD-10-CM | POA: Insufficient documentation

## 2016-09-14 DIAGNOSIS — M85832 Other specified disorders of bone density and structure, left forearm: Secondary | ICD-10-CM | POA: Diagnosis not present

## 2016-09-14 DIAGNOSIS — M85852 Other specified disorders of bone density and structure, left thigh: Secondary | ICD-10-CM | POA: Diagnosis not present

## 2016-09-14 DIAGNOSIS — Z1231 Encounter for screening mammogram for malignant neoplasm of breast: Secondary | ICD-10-CM | POA: Diagnosis not present

## 2016-09-18 ENCOUNTER — Other Ambulatory Visit: Payer: Self-pay | Admitting: Family Medicine

## 2016-09-18 ENCOUNTER — Other Ambulatory Visit: Payer: Self-pay | Admitting: Cardiovascular Disease

## 2016-09-22 DIAGNOSIS — M5137 Other intervertebral disc degeneration, lumbosacral region: Secondary | ICD-10-CM | POA: Diagnosis not present

## 2016-09-22 DIAGNOSIS — Z79891 Long term (current) use of opiate analgesic: Secondary | ICD-10-CM | POA: Diagnosis not present

## 2016-09-22 DIAGNOSIS — M4725 Other spondylosis with radiculopathy, thoracolumbar region: Secondary | ICD-10-CM | POA: Diagnosis not present

## 2016-09-22 DIAGNOSIS — G894 Chronic pain syndrome: Secondary | ICD-10-CM | POA: Diagnosis not present

## 2016-09-22 DIAGNOSIS — M961 Postlaminectomy syndrome, not elsewhere classified: Secondary | ICD-10-CM | POA: Diagnosis not present

## 2016-11-10 ENCOUNTER — Other Ambulatory Visit: Payer: Self-pay | Admitting: Cardiovascular Disease

## 2016-12-01 DIAGNOSIS — Z79891 Long term (current) use of opiate analgesic: Secondary | ICD-10-CM | POA: Diagnosis not present

## 2016-12-01 DIAGNOSIS — G894 Chronic pain syndrome: Secondary | ICD-10-CM | POA: Diagnosis not present

## 2016-12-01 DIAGNOSIS — M4725 Other spondylosis with radiculopathy, thoracolumbar region: Secondary | ICD-10-CM | POA: Diagnosis not present

## 2016-12-01 DIAGNOSIS — M5137 Other intervertebral disc degeneration, lumbosacral region: Secondary | ICD-10-CM | POA: Diagnosis not present

## 2016-12-01 DIAGNOSIS — M961 Postlaminectomy syndrome, not elsewhere classified: Secondary | ICD-10-CM | POA: Diagnosis not present

## 2016-12-21 ENCOUNTER — Encounter: Payer: Self-pay | Admitting: Family Medicine

## 2016-12-21 MED ORDER — DULOXETINE HCL 60 MG PO CPEP: 60.0000 mg | ORAL_CAPSULE | Freq: Every day | ORAL | 1 refills | Status: DC

## 2016-12-29 ENCOUNTER — Other Ambulatory Visit: Payer: Self-pay | Admitting: Cardiovascular Disease

## 2017-02-02 DIAGNOSIS — Z79891 Long term (current) use of opiate analgesic: Secondary | ICD-10-CM | POA: Diagnosis not present

## 2017-02-02 DIAGNOSIS — M961 Postlaminectomy syndrome, not elsewhere classified: Secondary | ICD-10-CM | POA: Diagnosis not present

## 2017-02-02 DIAGNOSIS — G894 Chronic pain syndrome: Secondary | ICD-10-CM | POA: Diagnosis not present

## 2017-02-02 DIAGNOSIS — M5137 Other intervertebral disc degeneration, lumbosacral region: Secondary | ICD-10-CM | POA: Diagnosis not present

## 2017-02-02 DIAGNOSIS — M4725 Other spondylosis with radiculopathy, thoracolumbar region: Secondary | ICD-10-CM | POA: Diagnosis not present

## 2017-02-22 DIAGNOSIS — H40003 Preglaucoma, unspecified, bilateral: Secondary | ICD-10-CM | POA: Diagnosis not present

## 2017-03-01 DIAGNOSIS — H40003 Preglaucoma, unspecified, bilateral: Secondary | ICD-10-CM | POA: Diagnosis not present

## 2017-03-20 NOTE — Progress Notes (Signed)
Cardiology Office Note  Date:  03/21/2017   ID:  JERMIKA OLDEN, DOB 28-Dec-1950, MRN [id number]  PCP:  Abner Greenspan, MD   Chief Complaint  Patient presents with  . other    Patient last seen in 2016. Meds reviewed by the pt. verbally.. Pt. c/o LE edema at times.     HPI:  Ms. Sackrider is a 66 year old woman with a history of  Raynaud's disease, takes diltiazem in the winter hyperlipidemia, LDL 149 HTN,  obesity, Will chronic lower extremity swelling   DM, HBA1C 6.3 who presents for followup of her palpitations and hypertension, edema.  Goes to [facility] Having raynauds Eye doctor recommending holding metoprolol in the PM , dry eyes Takes torsemide daily, previously was taking as needed, dry mouth, eyes   significant Raynaud's in her feet This is been a long-standing issue for 20 years Takes diltiazem in the winter only Minimal symptoms in her hands  History of fibromyalgia, takes pain medication  Biggest complaint is feeling very hot, sweating, chronic issue Palpitations reasonably well controlled on metoprolol twice a day   discussed elevated cholesterol with her in detail No regular exercise program  EKG personally reviewed by myself on todays visit shows normal sinus rhythm with rate 78 bpm, no significant ST or T-wave changes  Other past medical history She continues to drink a significant amount of fluid during the daytime She reports that on the metoprolol, her palpitations have been well-controlled.     Echocardiogram in October 2008 was essentially normal.   Stress test in October 2008 showed no ischemia. This is a exercise treadmill. She exercised for 4 minutes 30 seconds, peak heart rate of 152 beats per minute, peak blood pressure 172/94.      PMH:   has a past medical history of Anemia, iron deficiency; Bilateral lower extremity edema; Cervical dysplasia; Diabetes mellitus type II; Diverticulosis; DJD (degenerative joint disease); Dry eyes;  Edema; Fibromyalgia; GERD (gastroesophageal reflux disease); Hemorrhoids; HLD (hyperlipidemia); HNP (herniated nucleus pulposus) (2/99); HTN (hypertension); Hypothyroidism; Interstitial lung disease ([place]); Left ovarian cyst; Osteoarthritis; Osteopenia; Palpitations; Raynaud's disease; and Recurrent HSV (herpes simplex virus).  PSH:    Past Surgical History:  Procedure Laterality Date  . COLONOSCOPY  11/01   Diverticulosis; hemorrhoids  . HAND SURGERY     left thumb  . LASIK     bilateral    Current Outpatient Prescriptions  Medication Sig Dispense Refill  . buPROPion (WELLBUTRIN XL) 150 MG 24 hr tablet TAKE 1 TABLET BY MOUTH  DAILY 90 tablet 2  . Chlorphen-Phenyleph-ASA (ALKA-SELTZER PLUS COLD PO) Take by mouth as needed.      . cyclobenzaprine (FLEXERIL) 10 MG tablet TAKE ONE-HALF TO ONE TABLET BY MOUTH ONCE DAILY AND TAKE ONE TABLET BY MOUTH AT BEDTIME 180 tablet 3  . Diclofenac Sodium 1.5 % SOLN Place 2 mLs onto the skin 4 (four) times daily. 3 Bottle 3  . DIGESTIVE ENZYMES PO Take by mouth daily.     Marland Kitchen diltiazem (CARDIZEM LA) 120 MG 24 hr tablet Take 1 tablet (120 mg total) by mouth daily. As needed for raynaud's symptoms 30 tablet 3  . diltiazem (CARDIZEM) 30 MG tablet Take 1 tablet (30 mg total) by mouth 3 (three) times daily as needed. 90 tablet 6  . diphenhydrAMINE (BENADRYL) 25 mg capsule Take 25 mg by mouth as needed.      . DULoxetine (CYMBALTA) 60 MG capsule Take 1 capsule (60 mg total) by mouth daily. [place]  capsule 1  . esomeprazole (NEXIUM) 20 MG capsule TAKE 1 CAPSULE TWICE A DAY 180 capsule 3  . glucose blood test strip One Touch Ultra stripts blue-To check sugar once daily and as needed for DM2 250.00 100 each 3  . GUAIFENESIN CR PO Take by mouth as needed.      Marland Kitchen HYDROmorphone (DILAUDID) 8 MG tablet Take 12 mg by mouth 2 (two) times daily.     . hyoscyamine (LEVSIN SL) 0.125 MG SL tablet Take 1 tablet (0.125 mg total) by mouth every 4 (four) hours as needed for cramping.  270 tablet 0  . KRILL OIL PO Take by mouth daily.    Marland Kitchen levothyroxine (SYNTHROID, LEVOTHROID) 150 MCG tablet TAKE 1 TABLET BY MOUTH  DAILY 90 tablet 2  . Magnesium 400 MG CAPS Take by mouth daily.     . metFORMIN (GLUCOPHAGE) 500 MG tablet Take 1 tablet (500 mg total) by mouth 2 (two) times daily with a meal. 180 tablet 3  . metoprolol succinate (TOPROL-XL) 50 MG 24 hr tablet Take 1 tablet (50 mg total) by mouth 2 (two) times daily. 180 tablet 3  . oxyCODONE (OXYCONTIN) 10 MG 12 hr tablet Take 10 mg by mouth 3 (three) times daily as needed.     . ramipril (ALTACE) 5 MG capsule TAKE 1 CAPSULE BY MOUTH  DAILY 90 capsule 2  . ranitidine (ZANTAC) 300 MG tablet TAKE 1 TABLET BY MOUTH AT  BEDTIME 90 tablet 2  . torsemide (DEMADEX) 20 MG tablet Take 1 tablet by mouth two  times daily as needed 180 tablet 0   No current facility-administered medications for this visit.      Allergies:   Keflex [cephalexin]; Penicillins; Amitriptyline hcl; Atorvastatin; Benicar [olmesartan medoxomil]; Ceftin [cefuroxime]; Cetirizine hcl; Ciprofloxacin; Clonidine derivatives; Diltiazem hcl; Diovan [valsartan]; Erythromycin; Etodolac; Fluoxetine hcl; Furosemide; Gabapentin; Naproxen sodium; Paroxetine; Pregabalin; Sulfonamide derivatives; Tetracycline; Venlafaxine; and Cephalexin   Social History:  The patient  reports that she has quit smoking. Her smoking use included Cigarettes. She has never used smokeless tobacco. She reports that she does not drink alcohol or use drugs.   Family History:   family history includes Anemia in her brother; Coronary artery disease in her mother; Diabetes in her brother and sister; Emphysema in her father; Heart disease in her brother; Lung cancer in her mother; Lymphoma in her brother and sister.    Review of Systems: Review of Systems  Constitutional: Negative.   Respiratory: Negative.   Cardiovascular: Negative.   Gastrointestinal: Negative.   Musculoskeletal: Negative.    Neurological: Negative.   Psychiatric/Behavioral: Negative.   All other systems reviewed and are negative.    PHYSICAL EXAM: VS:  BP (!) 134/92 (BP Location: Right Arm, Patient Position: Sitting, Cuff Size: Normal)   Pulse 78   Ht 5\' 2"  (1.575 m)   Wt 199 lb 12 oz (90.6 kg)   BMI 36.53 kg/m  , BMI Body mass index is 36.53 kg/m. GEN: Well nourished, well developed, in no acute distress  HEENT: normal  Neck: no JVD, carotid bruits, or masses Cardiac: RRR; no murmurs, rubs, or gallops,no edema  Respiratory:  clear to auscultation bilaterally, normal work of breathing GI: soft, nontender, nondistended, + BS MS: no deformity or atrophy  Skin: warm and dry, no rash Neuro:  Strength and sensation are intact Psych: euthymic mood, full affect    Recent Labs: 07/26/2016: ALT 16; Hemoglobin 14.0; Platelets 320.0; TSH 0.55 08/25/2016: BUN 25; Creatinine, Ser 0.96; Potassium 4.6;  Sodium 138    Lipid Panel Lab Results  Component Value Date   CHOL 229 (H) 07/26/2016   HDL 45.20 07/26/2016   LDLCALC 126 (H) 06/11/2014   TRIG 232.0 (H) 07/26/2016      Wt Readings from Last 3 Encounters:  03/21/17 199 lb 12 oz (90.6 kg)  08/09/16 197 lb 12 oz (89.7 kg)  07/26/16 200 lb 12 oz (91.1 kg)       ASSESSMENT AND PLAN:  Leg edema - Plan: EKG 12-Lead None on today's visit  Mixed hyperlipidemia - Plan: EKG 12-Lead Long discussion concerning elevated LDL,  Discussed screening studies such as CT coronary calcium scoring and carotid ultrasound She does not want a statin at this time  Essential hypertension - Plan: EKG 12-Lead Recommended she monitor her diastolic pressure in follow-up visits with pain control clinic  Type 2 diabetes mellitus without complication, without long-term current use of insulin ([place]) - Plan: EKG 12-Lead We have encouraged continued exercise, careful diet management in an effort to lose weight.  Raynaud's disease without gangrene Taking diltiazem mainly in  the winter,  tolerable in the summer  Bilateral lower extremity edema She is taking torsemide daily, no swelling on today's visit Suspect component of venous insufficiency Mild climb in her BUN consistent with prerenal state Also with dry mouth, dry eyes Suggested she try torsemide every other day She is reluctant to come off  the torsemide  Disposition:   F/U  12 months   Total encounter time more than 25 minutes  Greater than 50% was spent in counseling and coordination of care with the patient    Orders Placed This Encounter  Procedures  . EKG 12-Lead     Signed, Esmond Plants, M.D., Ph.D. 03/21/2017  [place], [place]

## 2017-03-21 ENCOUNTER — Ambulatory Visit (INDEPENDENT_AMBULATORY_CARE_PROVIDER_SITE_OTHER): Payer: Medicare Other | Admitting: Cardiovascular Disease

## 2017-03-21 ENCOUNTER — Telehealth: Payer: Self-pay | Admitting: Cardiovascular Disease

## 2017-03-21 ENCOUNTER — Other Ambulatory Visit: Payer: Self-pay | Admitting: Cardiovascular Disease

## 2017-03-21 ENCOUNTER — Encounter: Payer: Self-pay | Admitting: Cardiovascular Disease

## 2017-03-21 VITALS — BP 134/92 | HR 78 | Ht 62.0 in | Wt 199.8 lb

## 2017-03-21 DIAGNOSIS — I7300 Raynaud's syndrome without gangrene: Secondary | ICD-10-CM | POA: Diagnosis not present

## 2017-03-21 DIAGNOSIS — E782 Mixed hyperlipidemia: Secondary | ICD-10-CM | POA: Diagnosis not present

## 2017-03-21 DIAGNOSIS — E119 Type 2 diabetes mellitus without complications: Secondary | ICD-10-CM

## 2017-03-21 DIAGNOSIS — R600 Localized edema: Secondary | ICD-10-CM | POA: Diagnosis not present

## 2017-03-21 DIAGNOSIS — I10 Essential (primary) hypertension: Secondary | ICD-10-CM | POA: Diagnosis not present

## 2017-03-21 MED ORDER — TORSEMIDE 20 MG PO TABS: ORAL_TABLET | ORAL | 3 refills | Status: DC

## 2017-03-21 MED ORDER — DILTIAZEM HCL 30 MG PO TABS: 30.0000 mg | ORAL_TABLET | Freq: Three times a day (TID) | ORAL | 3 refills | Status: DC | PRN

## 2017-03-21 MED ORDER — METOPROLOL SUCCINATE ER 50 MG PO TB24: 50.0000 mg | ORAL_TABLET | Freq: Two times a day (BID) | ORAL | 3 refills | Status: DC

## 2017-03-21 MED ORDER — DILTIAZEM HCL ER COATED BEADS 120 MG PO TB24: 120.0000 mg | ORAL_TABLET | Freq: Every day | ORAL | 3 refills | Status: DC

## 2017-03-21 NOTE — Telephone Encounter (Signed)
Pt has a question regarding her Metformin. She asks if Dr. [name] will not refill, she will need to contact her PCP. Please call and advise.

## 2017-03-21 NOTE — Telephone Encounter (Signed)
She was advised during ov that this is not a cardiac med and will need to contact PCP.

## 2017-03-21 NOTE — Addendum Note (Signed)
Addended by: Dede Query R on: 03/21/2017 10:01 AM   Modules accepted: Orders

## 2017-03-21 NOTE — Patient Instructions (Addendum)
Medication Instructions:   No medication changes made  Labwork:  No new labs needed  Testing/Procedures:  Consider a CT coronary calcium score for screening   I recommend watching educational videos on topics of interest to you at:       [email or web address].goemmi.com  Enter code: HEARTCARE    Follow-Up: It was a pleasure seeing you in the office today. Please call us if you have new issues that need to be addressed before your next appt.  [phone]  Your physician wants you to follow-up in: 12 months.  You will receive a reminder letter in the mail two months in advance. If you don't receive a letter, please call our office to schedule the follow-up appointment.  If you need a refill on your cardiac medications before your next appointment, please call your pharmacy.

## 2017-03-22 ENCOUNTER — Other Ambulatory Visit: Payer: Self-pay

## 2017-03-22 MED ORDER — DILTIAZEM HCL ER COATED BEADS 120 MG PO TB24: 120.0000 mg | ORAL_TABLET | Freq: Every day | ORAL | 3 refills | Status: DC

## 2017-03-22 MED ORDER — METFORMIN HCL 500 MG PO TABS: 500.0000 mg | ORAL_TABLET | Freq: Two times a day (BID) | ORAL | 0 refills | Status: DC

## 2017-03-22 NOTE — Telephone Encounter (Signed)
Requested Prescriptions   Pending Prescriptions Disp Refills  . diltiazem (CARDIZEM) 30 MG tablet 90 tablet 3    Sig: Take 1 tablet (30 mg total) by mouth 3 (three) times daily as needed.   Signed Prescriptions Disp Refills  . diltiazem (CARDIZEM LA) 120 MG 24 hr tablet 90 tablet 3    Sig: Take 1 tablet (120 mg total) by mouth daily. As needed for raynaud's symptoms    Authorizing Provider: Minna Merritts    Ordering User: Janan Ridge

## 2017-03-25 ENCOUNTER — Telehealth: Payer: Self-pay | Admitting: Cardiovascular Disease

## 2017-03-25 ENCOUNTER — Encounter: Payer: Self-pay | Admitting: Cardiovascular Disease

## 2017-03-25 NOTE — Telephone Encounter (Signed)
Spoke w/ pharmacist. Advised of Dr Donivan Scull instructions:  If you take diltiazem  120 mg once a day, Please hold the metoprolol   Ok to take diltiazem 30 mg pills as needed for symptoms of palpitations or raynauds  She is appreciative of the call and will fill pts meds now.

## 2017-03-25 NOTE — Telephone Encounter (Signed)
[facility] calling stating there is a drug interaction with  Cardizem and Dilitazem   Ref num: [id number]

## 2017-03-25 NOTE — Telephone Encounter (Signed)
Please advise for Pt has both Diltiazem 120 mg and 30 mg tablet on medication list prn.

## 2017-03-29 DIAGNOSIS — B309 Viral conjunctivitis, unspecified: Secondary | ICD-10-CM | POA: Diagnosis not present

## 2017-04-06 DIAGNOSIS — Z79891 Long term (current) use of opiate analgesic: Secondary | ICD-10-CM | POA: Diagnosis not present

## 2017-04-06 DIAGNOSIS — M4725 Other spondylosis with radiculopathy, thoracolumbar region: Secondary | ICD-10-CM | POA: Diagnosis not present

## 2017-04-06 DIAGNOSIS — M961 Postlaminectomy syndrome, not elsewhere classified: Secondary | ICD-10-CM | POA: Diagnosis not present

## 2017-04-06 DIAGNOSIS — M5137 Other intervertebral disc degeneration, lumbosacral region: Secondary | ICD-10-CM | POA: Diagnosis not present

## 2017-04-06 DIAGNOSIS — G894 Chronic pain syndrome: Secondary | ICD-10-CM | POA: Diagnosis not present

## 2017-04-07 ENCOUNTER — Encounter: Payer: Self-pay | Admitting: Family Medicine

## 2017-04-08 ENCOUNTER — Other Ambulatory Visit: Payer: Self-pay | Admitting: Cardiovascular Disease

## 2017-04-08 MED ORDER — METFORMIN HCL 500 MG PO TABS: 500.0000 mg | ORAL_TABLET | Freq: Two times a day (BID) | ORAL | 2 refills | Status: DC

## 2017-05-15 ENCOUNTER — Telehealth: Payer: Self-pay | Admitting: Family Medicine

## 2017-05-15 DIAGNOSIS — E039 Hypothyroidism, unspecified: Secondary | ICD-10-CM

## 2017-05-15 DIAGNOSIS — I10 Essential (primary) hypertension: Secondary | ICD-10-CM

## 2017-05-15 DIAGNOSIS — E559 Vitamin D deficiency, unspecified: Secondary | ICD-10-CM

## 2017-05-15 DIAGNOSIS — E119 Type 2 diabetes mellitus without complications: Secondary | ICD-10-CM

## 2017-05-15 DIAGNOSIS — E782 Mixed hyperlipidemia: Secondary | ICD-10-CM

## 2017-05-15 NOTE — Telephone Encounter (Signed)
-----   Message from Ellamae Sia sent at 05/13/2017 11:51 AM EDT ----- Regarding: Lab orders for Tuesday, 7.31.18 Lab orders for a f/u appt

## 2017-05-18 ENCOUNTER — Other Ambulatory Visit: Payer: Medicare Other

## 2017-05-23 ENCOUNTER — Ambulatory Visit: Payer: Medicare Other | Admitting: Family Medicine

## 2017-06-08 DIAGNOSIS — M5137 Other intervertebral disc degeneration, lumbosacral region: Secondary | ICD-10-CM | POA: Diagnosis not present

## 2017-06-08 DIAGNOSIS — G894 Chronic pain syndrome: Secondary | ICD-10-CM | POA: Diagnosis not present

## 2017-06-08 DIAGNOSIS — Z79891 Long term (current) use of opiate analgesic: Secondary | ICD-10-CM | POA: Diagnosis not present

## 2017-06-08 DIAGNOSIS — M4725 Other spondylosis with radiculopathy, thoracolumbar region: Secondary | ICD-10-CM | POA: Diagnosis not present

## 2017-06-08 DIAGNOSIS — M961 Postlaminectomy syndrome, not elsewhere classified: Secondary | ICD-10-CM | POA: Diagnosis not present

## 2017-06-14 ENCOUNTER — Other Ambulatory Visit: Payer: Self-pay | Admitting: *Deleted

## 2017-06-14 NOTE — Telephone Encounter (Signed)
Please schedule PE after 10/23 and refill until then

## 2017-06-14 NOTE — Telephone Encounter (Signed)
No recent f/u or CPE and no future appts., please advise  

## 2017-06-15 NOTE — Telephone Encounter (Signed)
[facility] left v/m requesting cb about status of duloxetine rx.

## 2017-06-15 NOTE — Telephone Encounter (Signed)
Left voicemail requesting pt to call and schedule her CPE so we can fill med

## 2017-06-17 NOTE — Telephone Encounter (Signed)
Left 2nd voicemail requesting pt to call and schedule CPE before we can fill med

## 2017-06-21 ENCOUNTER — Encounter: Payer: Self-pay | Admitting: *Deleted

## 2017-06-21 MED ORDER — DULOXETINE HCL 60 MG PO CPEP: 60.0000 mg | ORAL_CAPSULE | Freq: Every day | ORAL | 0 refills | Status: DC

## 2017-06-21 NOTE — Telephone Encounter (Signed)
[facility] left v/m requesting status of duloxetine rx.

## 2017-06-21 NOTE — Telephone Encounter (Signed)
I faxed them a form back saying it was denied because Tina Berger hasn't called back and schedule CPE and Dr. Glori Bickers advised, I will also send Tina Berger a mychart letting her know she needs an appt

## 2017-06-21 NOTE — Addendum Note (Signed)
Addended by: Tammi Sou on: 06/21/2017 02:25 PM   Modules accepted: Orders

## 2017-06-23 ENCOUNTER — Other Ambulatory Visit: Payer: Self-pay | Admitting: Family Medicine

## 2017-06-23 NOTE — Telephone Encounter (Signed)
CPE scheduled 08/17/17, last filled on 04/13/16 #180 tabs with 3 additional refills, please advise

## 2017-07-04 ENCOUNTER — Other Ambulatory Visit: Payer: Self-pay | Admitting: *Deleted

## 2017-07-04 MED ORDER — METFORMIN HCL 500 MG PO TABS: 500.0000 mg | ORAL_TABLET | Freq: Two times a day (BID) | ORAL | 0 refills | Status: DC

## 2017-07-06 ENCOUNTER — Encounter: Payer: Self-pay | Admitting: Family Medicine

## 2017-07-06 MED ORDER — HYOSCYAMINE SULFATE 0.125 MG SL SUBL: 0.1250 mg | SUBLINGUAL_TABLET | SUBLINGUAL | 0 refills | Status: DC | PRN

## 2017-07-06 NOTE — Telephone Encounter (Signed)
Will refill electronically  

## 2017-07-06 NOTE — Telephone Encounter (Signed)
Last office visit 08/09/2016.  Last refilled 06/01/2012 for #270 by Dr. Verl Blalock.  Ok to refill?

## 2017-07-16 ENCOUNTER — Other Ambulatory Visit: Payer: Self-pay | Admitting: Family Medicine

## 2017-08-03 DIAGNOSIS — G894 Chronic pain syndrome: Secondary | ICD-10-CM | POA: Diagnosis not present

## 2017-08-03 DIAGNOSIS — M961 Postlaminectomy syndrome, not elsewhere classified: Secondary | ICD-10-CM | POA: Diagnosis not present

## 2017-08-03 DIAGNOSIS — Z79891 Long term (current) use of opiate analgesic: Secondary | ICD-10-CM | POA: Diagnosis not present

## 2017-08-03 DIAGNOSIS — M4725 Other spondylosis with radiculopathy, thoracolumbar region: Secondary | ICD-10-CM | POA: Diagnosis not present

## 2017-08-03 DIAGNOSIS — M5137 Other intervertebral disc degeneration, lumbosacral region: Secondary | ICD-10-CM | POA: Diagnosis not present

## 2017-08-10 ENCOUNTER — Ambulatory Visit (INDEPENDENT_AMBULATORY_CARE_PROVIDER_SITE_OTHER): Payer: Medicare Other

## 2017-08-10 VITALS — BP 92/60 | HR 90 | Temp 98.6°F | Ht 61.5 in | Wt 194.2 lb

## 2017-08-10 DIAGNOSIS — E039 Hypothyroidism, unspecified: Secondary | ICD-10-CM

## 2017-08-10 DIAGNOSIS — E119 Type 2 diabetes mellitus without complications: Secondary | ICD-10-CM | POA: Diagnosis not present

## 2017-08-10 DIAGNOSIS — E782 Mixed hyperlipidemia: Secondary | ICD-10-CM

## 2017-08-10 DIAGNOSIS — I10 Essential (primary) hypertension: Secondary | ICD-10-CM | POA: Diagnosis not present

## 2017-08-10 DIAGNOSIS — Z0000 Encounter for general adult medical examination without abnormal findings: Secondary | ICD-10-CM

## 2017-08-10 LAB — TSH: TSH: 0.85 u[IU]/mL (ref 0.35–4.50)

## 2017-08-10 LAB — COMPREHENSIVE METABOLIC PANEL
ALK PHOS: 76 U/L (ref 39–117)
ALT: 14 U/L (ref 0–35)
AST: 17 U/L (ref 0–37)
Albumin: 4.3 g/dL (ref 3.5–5.2)
BUN: 29 mg/dL — ABNORMAL HIGH (ref 6–23)
CALCIUM: 9.2 mg/dL (ref 8.4–10.5)
CO2: 32 meq/L (ref 19–32)
CREATININE: 0.94 mg/dL (ref 0.40–1.20)
Chloride: 95 mEq/L — ABNORMAL LOW (ref 96–112)
GFR: 63.03 mL/min (ref >60.00)
GLUCOSE: 110 mg/dL — AB (ref 70–99)
Potassium: 3.9 mEq/L (ref 3.5–5.1)
Sodium: 136 mEq/L (ref 135–145)
TOTAL PROTEIN: 7.3 g/dL (ref 6.0–8.3)
Total Bilirubin: 0.4 mg/dL (ref 0.2–1.2)

## 2017-08-10 LAB — HEMOGLOBIN A1C: Hgb A1c MFr Bld: 6.6 % — ABNORMAL HIGH (ref 4.6–6.5)

## 2017-08-10 LAB — LIPID PANEL
CHOL/HDL RATIO: 5
CHOLESTEROL: 209 mg/dL — AB (ref 0–200)
HDL: 43.9 mg/dL (ref >39.00)
LDL CALC: 137 mg/dL — AB (ref 0–99)
NonHDL: 165.39
TRIGLYCERIDES: 142 mg/dL (ref 0.0–149.0)
VLDL: 28.4 mg/dL (ref 0.0–40.0)

## 2017-08-10 NOTE — Progress Notes (Signed)
PCP notes:   Health maintenance:  Flu vaccine - postponed/pt is ill today Foot exam - PCP please address at next appt A1C - completed  Abnormal screenings:   Hearing - failed  Hearing Screening   125Hz  250Hz  500Hz  1000Hz  2000Hz  3000Hz  4000Hz  6000Hz  8000Hz   Right ear:   40 40 40  40    Left ear:   40 40 40  0      Patient concerns:   Glucometer - pt would like to discuss with PCP  Shingrix - pt would like to discuss with PCP  Nurse concerns:  None  Next PCP appt:   08/19/17 @ 1515  I reviewed health advisor's note, was available for consultation, and agree with documentation and plan. Loura Pardon MD

## 2017-08-10 NOTE — Progress Notes (Signed)
Subjective:   Tina Berger is a 66 y.o. female who presents for Medicare Annual (Subsequent) preventive examination.  Review of Systems:  N/A Cardiac Risk Factors include: obesity (BMI >30kg/m2);advanced age (>10men, >44 women);diabetes mellitus;dyslipidemia;hypertension     Objective:     Vitals: BP 92/60 (BP Location: Right Arm, Patient Position: Sitting, Cuff Size: Normal)   Pulse 90   Temp 98.6 F (37 C) (Oral)   Ht 5' 1.5" (1.562 m) Comment: no shoes  Wt 194 lb 4 oz (88.1 kg)   SpO2 92%   BMI 36.11 kg/m   Body mass index is 36.11 kg/m.   Tobacco History  Smoking Status  . Former Smoker  . Types: Cigarettes  Smokeless Tobacco  . Never Used    Comment: quit over 40 years     Counseling given: No   Past Medical History:  Diagnosis Date  . Anemia, iron deficiency   . Bilateral lower extremity edema    a. uses torsemide  . Cervical dysplasia    abnormal paps  . Diabetes mellitus type II    Diet controlled  . Diverticulosis   . DJD (degenerative joint disease)   . Dry eyes   . Edema   . Fibromyalgia   . GERD (gastroesophageal reflux disease)   . Hemorrhoids    external  . HLD (hyperlipidemia)   . HNP (herniated nucleus pulposus) 2/99   T6,7,8 with DJD  . HTN (hypertension)   . Hypothyroidism   . Interstitial lung disease ([place])    ?  Marland Kitchen Left ovarian cyst    x 3, rupture  . Osteoarthritis    hands  . Osteopenia    mild-11/01; improved 12/05  . Palpitations   . Raynaud's disease   . Recurrent HSV (herpes simplex virus)    lesions in nose   Past Surgical History:  Procedure Laterality Date  . COLONOSCOPY  11/01   Diverticulosis; hemorrhoids  . HAND SURGERY     left thumb  . LASIK     bilateral   Family History  Problem Relation Age of Onset  . Emphysema Father        + smoker  . Lung cancer Mother        + smoker  . Coronary artery disease Mother        relatively young  . Lymphoma Brother   . Lymphoma Sister   . Diabetes  Brother   . Diabetes Sister   . Heart disease Brother   . Anemia Brother        aplactic    History  Sexual Activity  . Sexual activity: Yes    Outpatient Encounter Prescriptions as of 08/10/2017  Medication Sig  . buPROPion (WELLBUTRIN XL) 150 MG 24 hr tablet TAKE 1 TABLET BY MOUTH  DAILY  . Chlorphen-Phenyleph-ASA (ALKA-SELTZER PLUS COLD PO) Take by mouth as needed.    . cyclobenzaprine (FLEXERIL) 10 MG tablet TAKE ONE-HALF TO ONE TABLET BY MOUTH ONCE DAILY AND ONE AT BEDTIME  . Diclofenac Sodium 1.5 % SOLN Place 2 mLs onto the skin 4 (four) times daily.  Marland Kitchen diltiazem (CARDIZEM LA) 120 MG 24 hr tablet Take 1 tablet (120 mg total) by mouth daily. As needed for raynaud's symptoms  . diltiazem (CARDIZEM) 30 MG tablet Take 1 tablet (30 mg total) by mouth 3 (three) times daily as needed.  . diphenhydrAMINE (BENADRYL) 25 mg capsule Take 25 mg by mouth as needed.    . DULoxetine (CYMBALTA) 60 MG capsule  Take 1 capsule (60 mg total) by mouth daily.  Marland Kitchen esomeprazole (NEXIUM) 20 MG capsule TAKE 1 CAPSULE TWICE A DAY (Patient taking differently: TAKE 1 CAPSULE ONCE DAILY)  . glucose blood test strip One Touch Ultra stripts blue-To check sugar once daily and as needed for DM2 250.00  . GUAIFENESIN CR PO Take by mouth as needed.    Marland Kitchen HYDROmorphone (DILAUDID) 8 MG tablet Take 12 mg by mouth 2 (two) times daily.   . hyoscyamine (LEVSIN SL) 0.125 MG SL tablet Take 1 tablet (0.125 mg total) by mouth every 4 (four) hours as needed for cramping.  Marland Kitchen levothyroxine (SYNTHROID, LEVOTHROID) 150 MCG tablet TAKE 1 TABLET BY MOUTH  DAILY  . Magnesium 400 MG CAPS Take by mouth daily.   . metFORMIN (GLUCOPHAGE) 500 MG tablet Take 1 tablet (500 mg total) by mouth 2 (two) times daily with a meal.  . metoprolol succinate (TOPROL-XL) 50 MG 24 hr tablet Take 1 tablet (50 mg total) by mouth 2 (two) times daily.  Marland Kitchen oxyCODONE (OXYCONTIN) 10 MG 12 hr tablet Take 10 mg by mouth 3 (three) times daily as needed.   . Probiotic  Product (PROBIOTIC PO) Take 1 capsule by mouth daily.  . ramipril (ALTACE) 5 MG capsule TAKE 1 CAPSULE BY MOUTH  DAILY  . ranitidine (ZANTAC) 300 MG tablet TAKE 1 TABLET BY MOUTH AT  BEDTIME  . torsemide (DEMADEX) 20 MG tablet Take 1 tablet by mouth two  times daily as needed  . [DISCONTINUED] DIGESTIVE ENZYMES PO Take by mouth daily.   . [DISCONTINUED] KRILL OIL PO Take by mouth daily.   No facility-administered encounter medications on file as of 08/10/2017.     Activities of Daily Living In your present state of health, do you have any difficulty performing the following activities: 08/10/2017  Hearing? N  Vision? N  Difficulty concentrating or making decisions? Y  Walking or climbing stairs? N  Dressing or bathing? N  Doing errands, shopping? N  Preparing Food and eating ? N  Using the Toilet? N  In the past six months, have you accidently leaked urine? N  Do you have problems with loss of bowel control? N  Managing your Medications? N  Managing your Finances? N  Housekeeping or managing your Housekeeping? N  Some recent data might be hidden    Patient Care Team: Tower, [name], MD as PCP - General [name], [name], MD as Consulting Physician (Cardiology)    Assessment:     Hearing Screening   125Hz  250Hz  500Hz  1000Hz  2000Hz  3000Hz  4000Hz  6000Hz  8000Hz   Right ear:   40 40 40  40    Left ear:   40 40 40  0    Vision Screening Comments: Last vision exam in Nov 2017    Exercise Activities and Dietary recommendations Current Exercise Habits: Home exercise routine, Type of exercise: walking, Time (Minutes): 30, Frequency (Times/Week): 7, Weekly Exercise (Minutes/Week): 210, Intensity: Mild, Exercise limited by: None identified  Goals    . Increase physical activity          Starting 10/24/218, I will continue to exercise at least 30 min daily.       Fall Risk Fall Risk  08/10/2017 07/26/2016  Falls in the past year? No No   Depression Screen PHQ 2/9 Scores  08/10/2017 07/26/2016  PHQ - 2 Score 0 0  PHQ- 9 Score 0 -     Cognitive Function MMSE - Mini Mental State Exam 08/10/2017 07/26/2016  07/26/2016  Orientation to time 5 - 5  Orientation to Place 5 - 5  Registration 3 - 3  Attention/ Calculation 0 - 0  Recall 3 (No Data) 1  Recall-comments - pt was unable to recall 2 of 3 words -  Language- name 2 objects 0 - 0  Language- repeat 1 - 1  Language- follow 3 step command 3 - 3  Language- read & follow direction 0 - 0  Write a sentence 0 - 0  Copy design 0 - 0  Total score 20 - 18       PLEASE NOTE: A Mini-Cog screen was completed. Maximum score is 20. A value of 0 denotes this part of [place] MMSE was not completed or the patient failed this part of the Mini-Cog screening.   Mini-Cog Screening Orientation to Time - Max 5 pts Orientation to Place - Max 5 pts Registration - Max 3 pts Recall - Max 3 pts Language Repeat - Max 1 pts Language Follow 3 Step Command - Max 3 pts   Immunization History  Administered Date(s) Administered  . Influenza Split 06/19/2011  . Influenza Whole 08/01/2009  . Influenza,inj,Quad PF,6+ Mos 06/14/2014, 08/25/2015, 07/26/2016  . Pneumococcal Conjugate-13 08/25/2015  . Pneumococcal Polysaccharide-23 08/25/2016  . Td 11/25/2003  . Tdap 06/14/2014  . Zoster 09/16/2014   Screening Tests Health Maintenance  Topic Date Due  . FOOT EXAM  08/19/2017 (Originally 08/09/2017)  . INFLUENZA VACCINE  01/15/2018 (Originally 05/18/2017)  . OPHTHALMOLOGY EXAM  08/24/2017  . MAMMOGRAM  09/14/2017  . HEMOGLOBIN A1C  02/08/2018  . Fecal DNA (Cologuard)  09/04/2019  . TETANUS/TDAP  06/14/2024  . DEXA SCAN  Completed  . Hepatitis C Screening  Completed  . PNA vac Low Risk Adult  Completed      Plan:     I have personally reviewed, addressed, and noted the following in the patient's chart:  A. Medical and social history B. Use of alcohol, tobacco or illicit drugs  C. Current medications and  supplements D. Functional ability and status E.  Nutritional status F.  Physical activity G. Advance directives H. List of other physicians I.  Hospitalizations, surgeries, and ER visits in previous 12 months J.  [place] to include hearing, vision, cognitive, depression L. Referrals and appointments - none  In addition, I have reviewed and discussed with patient certain preventive protocols, quality metrics, and best practice recommendations. A written personalized care plan for preventive services as well as general preventive health recommendations were provided to patient.  See attached scanned questionnaire for additional information.   Signed,   Lindell Noe, MHA, BS, LPN Health Coach

## 2017-08-10 NOTE — Patient Instructions (Signed)
Tina Berger , Thank you for taking time to come for your Medicare Wellness Visit. I appreciate your ongoing commitment to your health goals. Please review the following plan we discussed and let me know if I can assist you in the future.   These are the goals we discussed: Goals    . Increase physical activity          Starting 10/24/218, I will continue to exercise at least 30 min daily.        This is a list of the screening recommended for you and due dates:  Health Maintenance  Topic Date Due  . Complete foot exam   08/19/2017*  . Flu Shot  01/15/2018*  . Eye exam for diabetics  08/24/2017  . Mammogram  09/14/2017  . Hemoglobin A1C  02/08/2018  . Cologuard (Stool DNA test)  09/04/2019  . Tetanus Vaccine  06/14/2024  . DEXA scan (bone density measurement)  Completed  .  Hepatitis C: One time screening is recommended by Center for Disease Control  (CDC) for  adults born from [birth year] through 1966.   Completed  . Pneumonia vaccines  Completed  *Topic was postponed. The date shown is not the original due date.   Preventive Care for Adults  A healthy lifestyle and preventive care can promote health and wellness. Preventive health guidelines for adults include the following key practices.  . A routine yearly physical is a good way to check with your health care provider about your health and preventive screening. It is a chance to share any concerns and updates on your health and to receive a thorough exam.  . Visit your dentist for a routine exam and preventive care every 6 months. Brush your teeth twice a day and floss once a day. Good oral hygiene prevents tooth decay and gum disease.  . The frequency of eye exams is based on your age, health, family medical history, use  of contact lenses, and other factors. Follow your health care provider's recommendations for frequency of eye exams.  . Eat a healthy diet. Foods like vegetables, fruits, whole grains, low-fat dairy products, and  lean protein foods contain the nutrients you need without too many calories. Decrease your intake of foods high in solid fats, added sugars, and salt. Eat the right amount of calories for you. Get information about a proper diet from your health care provider, if necessary.  . Regular physical exercise is one of the most important things you can do for your health. Most adults should get at least 150 minutes of moderate-intensity exercise (any activity that increases your heart rate and causes you to sweat) each week. In addition, most adults need muscle-strengthening exercises on 2 or more days a week.  Silver Sneakers may be a benefit available to you. To determine eligibility, you may visit the website: www.silversneakers.com or contact program at [phone]) 715-2679 Mon-Fri between 8AM-8PM.   . Maintain a healthy weight. The body mass index (BMI) is a screening tool to identify possible weight problems. It provides an estimate of body fat based on height and weight. Your health care provider can find your BMI and can help you achieve or maintain a healthy weight.   For adults 20 years and older: ? A BMI below 18.5 is considered underweight. ? A BMI of 18.5 to 24.9 is normal. ? A BMI of 25 to 29.9 is considered overweight. ? A BMI of 30 and above is considered obese.   . Maintain normal blood lipids  and cholesterol levels by exercising and minimizing your intake of saturated fat. Eat a balanced diet with plenty of fruit and vegetables. Blood tests for lipids and cholesterol should begin at age 59 and be repeated every 5 years. If your lipid or cholesterol levels are high, you are over 50, or you are at high risk for heart disease, you may need your cholesterol levels checked more frequently. Ongoing high lipid and cholesterol levels should be treated with medicines if diet and exercise are not working.  . If you smoke, find out from your health care provider how to quit. If you do not use tobacco,  please do not start.  . If you choose to drink alcohol, please do not consume more than 2 drinks per day. One drink is considered to be 12 ounces (355 mL) of beer, 5 ounces (148 mL) of wine, or 1.5 ounces (44 mL) of liquor.  . If you are 8-26 years old, ask your health care provider if you should take aspirin to prevent strokes.  . Use sunscreen. Apply sunscreen liberally and repeatedly throughout the day. You should seek shade when your shadow is shorter than you. Protect yourself by wearing long sleeves, pants, a wide-brimmed hat, and sunglasses year round, whenever you are outdoors.  . Once a month, do a whole body skin exam, using a mirror to look at the skin on your back. Tell your health care provider of new moles, moles that have irregular borders, moles that are larger than a pencil eraser, or moles that have changed in shape or color.

## 2017-08-10 NOTE — Progress Notes (Signed)
Pre visit review using our clinic review tool, if applicable. No additional management support is needed unless otherwise documented below in the visit note. 

## 2017-08-17 ENCOUNTER — Encounter: Payer: Medicare Other | Admitting: Family Medicine

## 2017-08-19 ENCOUNTER — Ambulatory Visit (INDEPENDENT_AMBULATORY_CARE_PROVIDER_SITE_OTHER): Payer: Medicare Other | Admitting: Family Medicine

## 2017-08-19 ENCOUNTER — Encounter: Payer: Self-pay | Admitting: Family Medicine

## 2017-08-19 VITALS — BP 116/78 | HR 93 | Temp 99.2°F | Ht 61.5 in | Wt 198.8 lb

## 2017-08-19 DIAGNOSIS — E559 Vitamin D deficiency, unspecified: Secondary | ICD-10-CM

## 2017-08-19 DIAGNOSIS — E039 Hypothyroidism, unspecified: Secondary | ICD-10-CM | POA: Diagnosis not present

## 2017-08-19 DIAGNOSIS — Z0000 Encounter for general adult medical examination without abnormal findings: Secondary | ICD-10-CM

## 2017-08-19 DIAGNOSIS — Z6836 Body mass index (BMI) 36.0-36.9, adult: Secondary | ICD-10-CM | POA: Diagnosis not present

## 2017-08-19 DIAGNOSIS — Z23 Encounter for immunization: Secondary | ICD-10-CM

## 2017-08-19 DIAGNOSIS — I10 Essential (primary) hypertension: Secondary | ICD-10-CM

## 2017-08-19 DIAGNOSIS — E119 Type 2 diabetes mellitus without complications: Secondary | ICD-10-CM | POA: Diagnosis not present

## 2017-08-19 MED ORDER — RANITIDINE HCL 300 MG PO TABS: 300.0000 mg | ORAL_TABLET | Freq: Every day | ORAL | 3 refills | Status: DC

## 2017-08-19 MED ORDER — METFORMIN HCL 500 MG PO TABS: 500.0000 mg | ORAL_TABLET | Freq: Two times a day (BID) | ORAL | 3 refills | Status: DC

## 2017-08-19 MED ORDER — ESOMEPRAZOLE MAGNESIUM 20 MG PO CPDR: 20.0000 mg | DELAYED_RELEASE_CAPSULE | Freq: Every day | ORAL | 3 refills | Status: DC

## 2017-08-19 MED ORDER — BUPROPION HCL ER (XL) 150 MG PO TB24: 150.0000 mg | ORAL_TABLET | Freq: Every day | ORAL | 3 refills | Status: DC

## 2017-08-19 MED ORDER — LEVOTHYROXINE SODIUM 150 MCG PO TABS: 150.0000 ug | ORAL_TABLET | Freq: Every day | ORAL | 3 refills | Status: DC

## 2017-08-19 MED ORDER — DULOXETINE HCL 60 MG PO CPEP: 60.0000 mg | ORAL_CAPSULE | Freq: Every day | ORAL | 3 refills | Status: DC

## 2017-08-19 MED ORDER — RAMIPRIL 5 MG PO CAPS: 5.0000 mg | ORAL_CAPSULE | Freq: Every day | ORAL | 3 refills | Status: DC

## 2017-08-19 NOTE — Progress Notes (Signed)
Subjective:    Patient ID: Tina Berger, female    DOB: Sep 19, 1951, 66 y.o.   MRN: [id number]  HPI Here for health maintenance exam and to review chronic medical problems    Doing ok -feeling good for the most part  Had a cold -could not get a flu shot  Will get flu shot today   Interested in Shingrix when it is available   Her esophageal spasms are worse  Gets a muscle spasm in her tongue at night- it feels tight (only a little painful) = changed She is prone to odd muscle spasms in other parts of her body  glucosamine at bedtime and it helps  She takes magnesium  (helps constipation also)   Does not eat much fruit  Some blueberries  Less bread and crackers   Wt Readings from Last 3 Encounters:  08/19/17 198 lb 12 oz (90.2 kg)  08/10/17 194 lb 4 oz (88.1 kg)  03/21/17 199 lb 12 oz (90.6 kg)  up 4 lb - but trying to watch her diet  Exercise- does pilotes with a reformer-low impact  Also stands a lot more  36.95 kg/m  amw was 10/24 Missed 400 Hz in L ear screen -otherwise normal  She notices some slight hearing problems- has to turn TV louder  Not interested in a hearing aide  Eye exam 11/17-has an appt in 2 weeks  Has a cataract on L eye- mild and watching    Mammogram 11/17- she will call and set that up  Self breast exam - no lumps or changes   cologuard 11/17-negative   dexa 11/17- osteopenia lowest T score -1.8 She tried calcium - big pills Takes her D  No falls or fx this year   zostavax 11/15   bp is stable today  No cp or palpitations or headaches or edema  No side effects to medicines  BP Readings from Last 3 Encounters:  08/19/17 116/78  08/10/17 92/60  03/21/17 (!) 134/92     DM2 Lab Results  Component Value Date   HGBA1C 6.6 (H) 08/10/2017   This is up from 6.3  Very diligent about diet and on metformin   (getting away from bread and processed carbs) Wants to get more exercise (can occ walk for 30 min on a good day with her  fibromyalgia)   Hypothyroidism  Pt has no clinical changes No change in energy level/ hair or skin/ edema and no tremor Lab Results  Component Value Date   TSH 0.85 08/10/2017     Lab Results  Component Value Date   CREATININE 0.94 08/10/2017   BUN 29 (H) 08/10/2017   NA 136 08/10/2017   K 3.9 08/10/2017   CL 95 (L) 08/10/2017   CO2 32 08/10/2017   Lab Results  Component Value Date   WBC 8.3 07/26/2016   HGB 14.0 07/26/2016   HCT 43.0 07/26/2016   MCV 89.0 07/26/2016   PLT 320.0 07/26/2016    Lab Results  Component Value Date   ALT 14 08/10/2017   AST 17 08/10/2017   ALKPHOS 76 08/10/2017   BILITOT 0.4 08/10/2017    Lab Results  Component Value Date   CHOL 209 (H) 08/10/2017   CHOL 229 (H) 07/26/2016   CHOL 224 (H) 08/25/2015   Lab Results  Component Value Date   HDL 43.90 08/10/2017   HDL 45.20 07/26/2016   HDL 46.80 08/25/2015   Lab Results  Component Value Date   LDLCALC  137 (H) 08/10/2017   LDLCALC 126 (H) 06/11/2014   LDLCALC 127 (H) 01/10/2012   Lab Results  Component Value Date   TRIG 142.0 08/10/2017   TRIG 232.0 (H) 07/26/2016   TRIG 201.0 (H) 08/25/2015   Lab Results  Component Value Date   CHOLHDL 5 08/10/2017   CHOLHDL 5 07/26/2016   CHOLHDL 5 08/25/2015   Lab Results  Component Value Date   LDLDIRECT 149.0 07/26/2016   LDLDIRECT 136.0 08/25/2015   LDLDIRECT 135.6 10/08/2011   Statins do not agree with her  Avoiding sat and trans fats   Patient Active Problem List   Diagnosis Date Noted  . Leg edema 09/29/2015  . DM type 2 (diabetes mellitus, type 2) ([place]) 06/14/2014  . Encounter for routine gynecological examination 06/14/2014  . Colon cancer screening 06/14/2014  . Acute renal insufficiency 05/31/2014  . Bilateral lower extremity edema   . Cough 08/08/2012  . Post-menopausal 06/23/2012  . Interstitial lung disease ([place]) 03/21/2012  . Elevated liver enzymes 03/21/2012  . Rhinitis 03/21/2012  . Pedal edema 01/28/2012    . Degenerative disk disease 11/19/2011  . Lumbar disc disease with radiculopathy 11/18/2011  . Other screening mammogram 08/18/2011  . HTN (hypertension) 07/30/2011  . Raynaud disease 07/30/2011  . Obesity 07/30/2011  . Drug rash 05/22/2011  . Hyperkalemia 04/07/2011  . Routine general medical examination at a health care facility 03/28/2011  . PALPITATIONS 04/27/2010  . PERSONAL HISTORY ALLERGY UNSPEC MEDICINAL AGENT 03/18/2010  . SOMATIC DYSFUNCTION 12/11/2009  . DEPRESSION 06/14/2008  . Hyperglycemia 05/13/2008  . Vitamin D deficiency 05/13/2008  . ANXIETY 03/25/2008  . OSTEOPENIA 03/25/2008  . Hypothyroidism 03/28/2007  . Mixed hyperlipidemia 03/28/2007  . Other iron deficiency anemias 03/28/2007  . PANIC ATTACK 03/28/2007  . KERATOCONJUNCTIVITIS SICCA 03/28/2007  . Mitral valve disorder 03/28/2007  . ABNORMAL HEART RHYTHMS 03/28/2007  . RAYNAUD'S SYNDROME 03/28/2007  . ESOPHAGITIS 03/28/2007  . GERD 03/28/2007  . GASTRITIS 03/28/2007  . IBS 03/28/2007  . ROSACEA 03/28/2007  . OSTEOARTHRITIS 03/28/2007  . BACK PAIN 03/28/2007  . PLANTAR FASCIITIS 03/28/2007  . FIBROMYALGIA 03/28/2007  . MIGRAINES, HX OF 03/28/2007   Past Medical History:  Diagnosis Date  . Anemia, iron deficiency   . Bilateral lower extremity edema    a. uses torsemide  . Cervical dysplasia    abnormal paps  . Diabetes mellitus type II    Diet controlled  . Diverticulosis   . DJD (degenerative joint disease)   . Dry eyes   . Edema   . Fibromyalgia   . GERD (gastroesophageal reflux disease)   . Hemorrhoids    external  . HLD (hyperlipidemia)   . HNP (herniated nucleus pulposus) 2/99   T6,7,8 with DJD  . HTN (hypertension)   . Hypothyroidism   . Interstitial lung disease ([place])    ?  Marland Kitchen Left ovarian cyst    x 3, rupture  . Osteoarthritis    hands  . Osteopenia    mild-11/01; improved 12/05  . Palpitations   . Raynaud's disease   . Recurrent HSV (herpes simplex virus)    lesions  in nose   Past Surgical History:  Procedure Laterality Date  . COLONOSCOPY  11/01   Diverticulosis; hemorrhoids  . HAND SURGERY     left thumb  . LASIK     bilateral   Social History   Tobacco Use  . Smoking status: Former Smoker    Types: Cigarettes  . Smokeless tobacco: Never Used  .  Tobacco comment: quit over 40 years  Substance Use Topics  . Alcohol use: No    Alcohol/week: 0.0 oz  . Drug use: No   Family History  Problem Relation Age of Onset  . Emphysema Father        + smoker  . Lung cancer Mother        + smoker  . Coronary artery disease Mother        relatively young  . Lymphoma Brother   . Lymphoma Sister   . Diabetes Brother   . Diabetes Sister   . Heart disease Brother   . Anemia Brother        aplactic    Allergies  Allergen Reactions  . Keflex [Cephalexin] Anaphylaxis  . Penicillins Anaphylaxis  . Amitriptyline Hcl     REACTION: sedating  . Atorvastatin     REACTION: muscle  . Benicar [Olmesartan Medoxomil]     Muscle pain   . Ceftin [Cefuroxime]     Swelling, "legs turn blue"  . Cetirizine Hcl     REACTION: headache  . Ciprofloxacin     REACTION: ? rash vs sun rxn  . Clonidine Derivatives     Swelling   . Diltiazem Hcl     REACTION: reaction not known  . Diovan [Valsartan]     Thought it made her feel confused  . Erythromycin     Rash, swollen gums   . Etodolac     REACTION: reaction not known  . Fluoxetine Hcl     REACTION: stomach problems  . Furosemide     REACTION: swelling  . Gabapentin     REACTION: edema  . Naproxen Sodium     REACTION: edema  . Paroxetine     REACTION: weight gain  . Pregabalin     REACTION: swelling  . Sulfonamide Derivatives     Rash, swollen gums, lips  . Tetracycline     REACTION:inflammed genitals  . Venlafaxine     REACTION: sweating  . Cephalexin Hives and Rash   Current Outpatient Medications on File Prior to Visit  Medication Sig Dispense Refill  . Chlorphen-Phenyleph-ASA  (ALKA-SELTZER PLUS COLD PO) Take by mouth as needed.      . cyclobenzaprine (FLEXERIL) 10 MG tablet TAKE ONE-HALF TO ONE TABLET BY MOUTH ONCE DAILY AND ONE AT BEDTIME 180 tablet 1  . Diclofenac Sodium 1.5 % SOLN Place 2 mLs onto the skin 4 (four) times daily. 3 Bottle 3  . diltiazem (CARDIZEM LA) 120 MG 24 hr tablet Take 1 tablet (120 mg total) by mouth daily. As needed for raynaud's symptoms 90 tablet 3  . diltiazem (CARDIZEM) 30 MG tablet Take 1 tablet (30 mg total) by mouth 3 (three) times daily as needed. 90 tablet 3  . diphenhydrAMINE (BENADRYL) 25 mg capsule Take 25 mg by mouth as needed.      Marland Kitchen glucose blood test strip One Touch Ultra stripts blue-To check sugar once daily and as needed for DM2 250.00 100 each 3  . GUAIFENESIN CR PO Take by mouth as needed.      Marland Kitchen HYDROmorphone (DILAUDID) 8 MG tablet Take 12 mg by mouth 2 (two) times daily.     . hyoscyamine (LEVSIN SL) 0.125 MG SL tablet Take 1 tablet (0.125 mg total) by mouth every 4 (four) hours as needed for cramping. 270 tablet 0  . Magnesium 400 MG CAPS Take by mouth daily.     . metoprolol succinate (TOPROL-XL) 50 MG 24  hr tablet Take 1 tablet (50 mg total) by mouth 2 (two) times daily. 180 tablet 3  . oxyCODONE (OXYCONTIN) 10 MG 12 hr tablet Take 10 mg by mouth 3 (three) times daily as needed.     . Probiotic Product (PROBIOTIC PO) Take 1 capsule by mouth daily.    Marland Kitchen torsemide (DEMADEX) 20 MG tablet Take 1 tablet by mouth two  times daily as needed 180 tablet 3   No current facility-administered medications on file prior to visit.      Review of Systems  Constitutional: Negative for activity change, appetite change, fatigue, fever and unexpected weight change.  HENT: Negative for congestion, ear pain, rhinorrhea, sinus pressure and sore throat.   Eyes: Negative for pain, redness and visual disturbance.  Respiratory: Negative for cough, shortness of breath and wheezing.   Cardiovascular: Negative for chest pain and palpitations.        Pos for raynauds   Gastrointestinal: Negative for abdominal pain, blood in stool, constipation and diarrhea.       Pos for esophageal spasm  Endocrine: Negative for polydipsia and polyuria.  Genitourinary: Negative for dysuria, frequency and urgency.  Musculoskeletal: Positive for arthralgias and myalgias. Negative for back pain and joint swelling.  Skin: Negative for pallor and rash.  Allergic/Immunologic: Negative for environmental allergies.  Neurological: Negative for dizziness, syncope and headaches.  Hematological: Negative for adenopathy. Does not bruise/bleed easily.  Psychiatric/Behavioral: Positive for decreased concentration. Negative for dysphoric mood. The patient is not nervous/anxious.        Objective:   Physical Exam  Constitutional: She appears well-developed and well-nourished. No distress.  obese and well appearing   HENT:  Head: Normocephalic and atraumatic.  Right Ear: External ear normal.  Left Ear: External ear normal.  Mouth/Throat: Oropharynx is clear and moist.  Eyes: Conjunctivae and EOM are normal. Pupils are equal, round, and reactive to light. No scleral icterus.  Neck: Normal range of motion. Neck supple. No JVD present. Carotid bruit is not present. No thyromegaly present.  Cardiovascular: Normal rate, regular rhythm, normal heart sounds and intact distal pulses. Exam reveals no gallop.  Marked purple to red skin color change on feet from Raynaud's  This is baseline Nl pulses   Pulmonary/Chest: Effort normal and breath sounds normal. No respiratory distress. She has no wheezes. She exhibits no tenderness.  Abdominal: Soft. Bowel sounds are normal. She exhibits no distension, no abdominal bruit and no mass. There is no tenderness.  Genitourinary: No breast swelling, tenderness, discharge or bleeding.  Genitourinary Comments: Breast exam: No mass, nodules, thickening, tenderness, bulging, retraction, inflamation, nipple discharge or skin changes  noted.  No axillary or clavicular LA.      Musculoskeletal: Normal range of motion. She exhibits no edema or tenderness.  No kyphosis   Lymphadenopathy:    She has no cervical adenopathy.  Neurological: She is alert. She has normal reflexes. No cranial nerve deficit. She exhibits normal muscle tone. Coordination normal.  Skin: Skin is warm and dry. No rash noted. No erythema. No pallor.  Solar lentigines diffusely  Color change from Raynaud's on feet-no change  Psychiatric: She has a normal mood and affect.          Assessment & Plan:   Problem List Items Addressed This Visit      Cardiovascular and Mediastinum   HTN (hypertension) - Primary    bp in fair control at this time  BP Readings from Last 1 Encounters:  08/19/17 116/78   No  changes needed Disc lifstyle change with low sodium diet and exercise  Labs reviewed  Wt loss enc      Relevant Medications   ramipril (ALTACE) 5 MG capsule     Endocrine   DM type 2 (diabetes mellitus, type 2) ([facility])    Lab Results  Component Value Date   HGBA1C 6.6 (H) 08/10/2017   Continue metformin  Working on wt loss Eye exam in 2 wk  Disc foot care       Relevant Medications   ramipril (ALTACE) 5 MG capsule   metFORMIN (GLUCOPHAGE) 500 MG tablet   Hypothyroidism    Hypothyroidism  Pt has no clinical changes No change in energy level/ hair or skin/ edema and no tremor Lab Results  Component Value Date   TSH 0.85 08/10/2017          Relevant Medications   levothyroxine (SYNTHROID, LEVOTHROID) 150 MCG tablet     Other   Obesity    Discussed how this problem influences overall health and the risks it imposes  Reviewed plan for weight loss with lower calorie diet (via better food choices and also portion control or program like weight watchers) and exercise building up to or more than 30 minutes 5 days per week including some aerobic activity         Relevant Medications   metFORMIN (GLUCOPHAGE) 500 MG tablet    Routine general medical examination at a health care facility    Reviewed health habits including diet and exercise and skin cancer prevention Reviewed appropriate screening tests for age  Also reviewed health mt list, fam hx and immunization status , as well as social and family history   See HPI Labs rev  amw rev   (disc hearing ) Wt loss enc Flu shot today  She will schedule her mammogram  Enc more water intake        Vitamin D deficiency    Vitamin D level is therapeutic with current supplementation Disc importance of this to bone and overall health        Other Visit Diagnoses    Need for influenza vaccination       Relevant Orders   Flu Vaccine QUAD 6+ mos PF IM (Fluarix Quad PF) (Completed)

## 2017-08-19 NOTE — Patient Instructions (Addendum)
Flu shot today   Get your eye exam and please ask them to send [place] a copy   Don't forget to schedule your mammogram   Drink more water  64 oz of fluid per day is recommended (mostly water)

## 2017-08-21 NOTE — Assessment & Plan Note (Signed)
11/17 lowest T score -1.8 No falls or fx On ca and D  Re check at 1 y

## 2017-08-21 NOTE — Assessment & Plan Note (Signed)
Vitamin D level is therapeutic with current supplementation Disc importance of this to bone and overall health  

## 2017-08-21 NOTE — Assessment & Plan Note (Signed)
Reviewed health habits including diet and exercise and skin cancer prevention Reviewed appropriate screening tests for age  Also reviewed health mt list, fam hx and immunization status , as well as social and family history   See HPI Labs rev  amw rev   (disc hearing ) Wt loss enc Flu shot today  She will schedule her mammogram  Enc more water intake

## 2017-08-21 NOTE — Assessment & Plan Note (Signed)
Discussed how this problem influences overall health and the risks it imposes  Reviewed plan for weight loss with lower calorie diet (via better food choices and also portion control or program like weight watchers) and exercise building up to or more than 30 minutes 5 days per week including some aerobic activity    

## 2017-08-21 NOTE — Assessment & Plan Note (Signed)
Lab Results  Component Value Date   HGBA1C 6.6 (H) 08/10/2017   Continue metformin  Working on wt loss Eye exam in 2 wk  Disc foot care

## 2017-08-21 NOTE — Assessment & Plan Note (Signed)
bp in fair control at this time  BP Readings from Last 1 Encounters:  08/19/17 116/78   No changes needed Disc lifstyle change with low sodium diet and exercise  Labs reviewed  Wt loss enc

## 2017-08-21 NOTE — Assessment & Plan Note (Signed)
Hypothyroidism  Pt has no clinical changes No change in energy level/ hair or skin/ edema and no tremor Lab Results  Component Value Date   TSH 0.85 08/10/2017

## 2017-08-29 DIAGNOSIS — E119 Type 2 diabetes mellitus without complications: Secondary | ICD-10-CM | POA: Diagnosis not present

## 2017-08-29 LAB — HM DIABETES EYE EXAM

## 2017-09-05 ENCOUNTER — Encounter: Payer: Self-pay | Admitting: Family Medicine

## 2017-09-19 ENCOUNTER — Encounter: Payer: Self-pay | Admitting: Family Medicine

## 2017-09-19 ENCOUNTER — Ambulatory Visit: Payer: Medicare Other | Admitting: Family Medicine

## 2017-09-19 ENCOUNTER — Other Ambulatory Visit: Payer: Self-pay

## 2017-09-19 ENCOUNTER — Ambulatory Visit (INDEPENDENT_AMBULATORY_CARE_PROVIDER_SITE_OTHER): Payer: Medicare Other | Admitting: Family Medicine

## 2017-09-19 VITALS — BP 112/68 | HR 112 | Temp 99.6°F | Ht 61.5 in | Wt 196.5 lb

## 2017-09-19 DIAGNOSIS — J0100 Acute maxillary sinusitis, unspecified: Secondary | ICD-10-CM

## 2017-09-19 DIAGNOSIS — Z889 Allergy status to unspecified drugs, medicaments and biological substances status: Secondary | ICD-10-CM | POA: Diagnosis not present

## 2017-09-19 MED ORDER — LEVOFLOXACIN 500 MG PO TABS: 500.0000 mg | ORAL_TABLET | Freq: Every day | ORAL | 0 refills | Status: DC

## 2017-09-19 NOTE — Progress Notes (Signed)
Dr. Frederico Hamman T. Evi Mccomb, MD, [facility] [place], [address] Phone: [phone] Fax: [phone]  09/19/2017  Patient: Tina Berger, MRN: [id number], DOB: 29-Jun-1951, 66 y.o.  Primary Physician:  Tower, [name], MD   Chief Complaint  Patient presents with  . Sinusitis   Subjective:   This 66 y.o. female patient presents with runny nose, sneezing, cough, sore throat, malaise and minimal / low-grade fever for 2 week. Now the primary complaint has become sinus pressure and pain behind the eyes and in the upper, anterior face.   Started pff with a bad sore throat and now up into her sinuses and teeth are aching. 2 weeks tomorrow. Has been taking some sudaphed and mucinex.  The patent denies sore throat as the primary complaint. Denies sthortness of breath/wheezing, high fever, chest pain, significant myalgia, otalgia, abdominal pain, changes in bowel or bladder.  PMH, PHS, Allergies, Problem List, Medications, Family History, and Social History have all been reviewed.  Patient Active Problem List   Diagnosis Date Noted  . Leg edema 09/29/2015  . DM type 2 (diabetes mellitus, type 2) ([place]) 06/14/2014  . Encounter for routine gynecological examination 06/14/2014  . Colon cancer screening 06/14/2014  . Acute renal insufficiency 05/31/2014  . Bilateral lower extremity edema   . Cough 08/08/2012  . Post-menopausal 06/23/2012  . Interstitial lung disease ([place]) 03/21/2012  . Elevated liver enzymes 03/21/2012  . Rhinitis 03/21/2012  . Pedal edema 01/28/2012  . Degenerative disk disease 11/19/2011  . Lumbar disc disease with radiculopathy 11/18/2011  . Other screening mammogram 08/18/2011  . HTN (hypertension) 07/30/2011  . Raynaud disease 07/30/2011  . Obesity 07/30/2011  . Drug rash 05/22/2011  . Routine general medical examination at a health care facility 03/28/2011  . PALPITATIONS 04/27/2010  . PERSONAL HISTORY  ALLERGY UNSPEC MEDICINAL AGENT 03/18/2010  . SOMATIC DYSFUNCTION 12/11/2009  . DEPRESSION 06/14/2008  . Hyperglycemia 05/13/2008  . Vitamin D deficiency 05/13/2008  . ANXIETY 03/25/2008  . OSTEOPENIA 03/25/2008  . Hypothyroidism 03/28/2007  . Mixed hyperlipidemia 03/28/2007  . Other iron deficiency anemias 03/28/2007  . PANIC ATTACK 03/28/2007  . KERATOCONJUNCTIVITIS SICCA 03/28/2007  . Mitral valve disorder 03/28/2007  . ABNORMAL HEART RHYTHMS 03/28/2007  . RAYNAUD'S SYNDROME 03/28/2007  . ESOPHAGITIS 03/28/2007  . GERD 03/28/2007  . GASTRITIS 03/28/2007  . IBS 03/28/2007  . ROSACEA 03/28/2007  . OSTEOARTHRITIS 03/28/2007  . BACK PAIN 03/28/2007  . PLANTAR FASCIITIS 03/28/2007  . FIBROMYALGIA 03/28/2007  . MIGRAINES, HX OF 03/28/2007    Past Medical History:  Diagnosis Date  . Anemia, iron deficiency   . Bilateral lower extremity edema    a. uses torsemide  . Cervical dysplasia    abnormal paps  . Diabetes mellitus type II    Diet controlled  . Diverticulosis   . DJD (degenerative joint disease)   . Dry eyes   . Edema   . Fibromyalgia   . GERD (gastroesophageal reflux disease)   . Hemorrhoids    external  . HLD (hyperlipidemia)   . HNP (herniated nucleus pulposus) 2/99   T6,7,8 with DJD  . HTN (hypertension)   . Hypothyroidism   . Interstitial lung disease ([place])    ?  Marland Kitchen Left ovarian cyst    x 3, rupture  . Osteoarthritis    hands  . Osteopenia    mild-11/01; improved 12/05  . Palpitations   . Raynaud's disease   . Recurrent HSV (  herpes simplex virus)    lesions in nose    Past Surgical History:  Procedure Laterality Date  . COLONOSCOPY  11/01   Diverticulosis; hemorrhoids  . HAND SURGERY     left thumb  . LASIK     bilateral    Social History   Socioeconomic History  . Marital status: Married    Spouse name: Not on file  . Number of children: 1  . Years of education: Not on file  . Highest education level: Not on file  Social Needs  .  Financial resource strain: Not on file  . Food insecurity - worry: Not on file  . Food insecurity - inability: Not on file  . Transportation needs - medical: Not on file  . Transportation needs - non-medical: Not on file  Occupational History  . 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 Smoking status: Former Smoker    Types: Cigarettes  . Smokeless tobacco: Never Used  . Tobacco comment: quit over 40 years  Substance and Sexual Activity  . Alcohol use: No    Alcohol/week: 0.0 oz  . Drug use: No  . Sexual activity: Yes  Other Topics Concern  . Not on file  Social History Narrative   Married      1 child      Network engineer to Scientist, physiological at [employer]      Does regularly exercise      Daily caffeine use: 2/day          Family History  Problem Relation Age of Onset  . Emphysema Father        + smoker  . Lung cancer Mother        + smoker  . Coronary artery disease Mother        relatively young  . Lymphoma Brother   . Lymphoma Sister   . Diabetes Brother   . Diabetes Sister   . Heart disease Brother   . Anemia Brother        aplactic     Allergies  Allergen Reactions  . Keflex [Cephalexin] Anaphylaxis  . Penicillins Anaphylaxis  . Amitriptyline Hcl     REACTION: sedating  . Atorvastatin     REACTION: muscle  . Benicar [Olmesartan Medoxomil]     Muscle pain   . Ceftin [Cefuroxime]     Swelling, "legs turn blue"  . Cetirizine Hcl     REACTION: headache  . Ciprofloxacin     REACTION: ? rash vs sun rxn  . Clonidine Derivatives     Swelling   . Diltiazem Hcl     REACTION: reaction not known  . Diovan [Valsartan]     Thought it made her feel confused  . Erythromycin     Rash, swollen gums   . Etodolac     REACTION: reaction not known  . Fluoxetine Hcl     REACTION: stomach problems  . Furosemide     REACTION: swelling  . Gabapentin     REACTION: edema  . Naproxen Sodium     REACTION: edema  . Paroxetine      REACTION: weight gain  . Pregabalin     REACTION: swelling  . Sulfonamide Derivatives     Rash, swollen gums, lips  . Tetracycline     REACTION:inflammed genitals  . Venlafaxine     REACTION: sweating  . Cephalexin Hives and Rash    Medication list reviewed and updated  in full in [facility].  ROS as above, eating and drinking - tolerating PO. Urinating normally. No excessive vomitting or diarrhea. O/w as above.  Objective:   Blood pressure 112/68, pulse (!) 112, temperature 99.6 F (37.6 C), temperature source Oral, height 5' 1.5" (1.562 m), weight 196 lb 8 oz (89.1 kg).  GEN: WDWN, Non-toxic, Atraumatic, normocephalic. A and O x 3. HEENT: Oropharynx clear without exudate, MMM, no significant LAD, mild rhinnorhea Sinuses: Right Frontal, ethmoid, and maxillary: Tender Left Frontal, Ethmoid, and maxillary: Tender Ears: TM clear, COL visualized with good landmarks CV: RRR, no m/g/r. Pulm: CTA B, no wheezes, rhonchi, or crackles, normal respiratory effort. EXT: no c/c/e Psych: well oriented, neither depressed nor anxious in appearance  Assessment and Plan:   Acute non-recurrent maxillary sinusitis  Multiple allergies  Acute sinusitis: ABX as below.   Reviewed symptomatic care as well as ABX in this case.  LVQ due to multiple allergies  Follow-up: No Follow-up on file.  Signed,  Maud Deed. Dashayla Theissen, MD    Medication List        Accurate as of 09/19/17  1:27 PM. Always use your most recent med list.          ALKA-SELTZER PLUS COLD PO   BENADRYL 25 mg capsule Generic drug:  diphenhydrAMINE   buPROPion 150 MG 24 hr tablet Commonly known as:  WELLBUTRIN XL Take 1 tablet (150 mg total) by mouth daily.   cyclobenzaprine 10 MG tablet Commonly known as:  FLEXERIL TAKE ONE-HALF TO ONE TABLET BY MOUTH ONCE DAILY AND ONE AT BEDTIME   Diclofenac Sodium 1.5 % Soln Place 2 mLs onto the skin 4 (four) times daily.   diltiazem 120 MG 24 hr tablet Commonly known  as:  CARDIZEM LA Take 1 tablet (120 mg total) by mouth daily. As needed for raynaud's symptoms   diltiazem 30 MG tablet Commonly known as:  CARDIZEM Take 1 tablet (30 mg total) by mouth 3 (three) times daily as needed.   DULoxetine 60 MG capsule Commonly known as:  CYMBALTA Take 1 capsule (60 mg total) by mouth daily.   esomeprazole 20 MG capsule Commonly known as:  NEXIUM Take 1 capsule (20 mg total) by mouth daily.   glucose blood test strip One Touch Ultra stripts blue-To check sugar once daily and as needed for DM2 250.00   GUAIFENESIN CR PO   HYDROmorphone 8 MG tablet Commonly known as:  DILAUDID   hyoscyamine 0.125 MG SL tablet Commonly known as:  LEVSIN SL Take 1 tablet (0.125 mg total) by mouth every 4 (four) hours as needed for cramping.   levofloxacin 500 MG tablet Commonly known as:  LEVAQUIN Take 1 tablet (500 mg total) by mouth daily.   levothyroxine 150 MCG tablet Commonly known as:  SYNTHROID, LEVOTHROID Take 1 tablet (150 mcg total) by mouth daily.   Magnesium 400 MG Caps   metFORMIN 500 MG tablet Commonly known as:  GLUCOPHAGE Take 1 tablet (500 mg total) by mouth 2 (two) times daily with a meal.   metoprolol succinate 50 MG 24 hr tablet Commonly known as:  TOPROL-XL Take 1 tablet (50 mg total) by mouth 2 (two) times daily.   oxyCODONE 10 MG 12 hr tablet Commonly known as:  OXYCONTIN   PROBIOTIC PO   ramipril 5 MG capsule Commonly known as:  ALTACE Take 1 capsule (5 mg total) by mouth daily.   ranitidine 300 MG tablet Commonly known as:  ZANTAC Take 1 tablet (300  mg total) by mouth at bedtime.   torsemide 20 MG tablet Commonly known as:  DEMADEX Take 1 tablet by mouth two  times daily as needed       Where to Get Your Medications    These medications were sent to [id number] - [place], [place], [place] [address]   Phone:  [phone]   levofloxacin 500 MG tablet

## 2017-09-20 ENCOUNTER — Telehealth: Payer: Self-pay | Admitting: Family Medicine

## 2017-09-20 NOTE — Telephone Encounter (Signed)
Contacted by Kerry Fort, RN from [facility] regarding if pt was on statin; given MD's recommendations from 08/19/17 "Statins do not agree with her  Avoiding sat and trans fats

## 2017-09-29 DIAGNOSIS — M5137 Other intervertebral disc degeneration, lumbosacral region: Secondary | ICD-10-CM | POA: Diagnosis not present

## 2017-09-29 DIAGNOSIS — Z79899 Other long term (current) drug therapy: Secondary | ICD-10-CM | POA: Diagnosis not present

## 2017-09-29 DIAGNOSIS — M4725 Other spondylosis with radiculopathy, thoracolumbar region: Secondary | ICD-10-CM | POA: Diagnosis not present

## 2017-09-29 DIAGNOSIS — M961 Postlaminectomy syndrome, not elsewhere classified: Secondary | ICD-10-CM | POA: Diagnosis not present

## 2017-09-29 DIAGNOSIS — G894 Chronic pain syndrome: Secondary | ICD-10-CM | POA: Diagnosis not present

## 2017-09-29 DIAGNOSIS — Z79891 Long term (current) use of opiate analgesic: Secondary | ICD-10-CM | POA: Diagnosis not present

## 2017-10-05 ENCOUNTER — Other Ambulatory Visit: Payer: Self-pay | Admitting: Family Medicine

## 2017-10-24 ENCOUNTER — Encounter: Payer: Self-pay | Admitting: Nurse Practitioner

## 2017-10-24 ENCOUNTER — Ambulatory Visit: Payer: Medicare Other | Attending: Nurse Practitioner | Admitting: Nurse Practitioner

## 2017-10-24 ENCOUNTER — Other Ambulatory Visit: Payer: Self-pay

## 2017-10-24 ENCOUNTER — Ambulatory Visit
Admission: RE | Admit: 2017-10-24 | Discharge: 2017-10-24 | Disposition: A | Discharge status: Home / Self Care | Payer: Medicare Other | Source: Ambulatory Visit | Attending: Nurse Practitioner | Admitting: Nurse Practitioner

## 2017-10-24 VITALS — BP 125/87 | HR 90 | Temp 99.0°F | Resp 18 | Ht 61.0 in | Wt 200.0 lb

## 2017-10-24 DIAGNOSIS — G8929 Other chronic pain: Secondary | ICD-10-CM

## 2017-10-24 DIAGNOSIS — M5441 Lumbago with sciatica, right side: Secondary | ICD-10-CM

## 2017-10-24 DIAGNOSIS — Z5181 Encounter for therapeutic drug level monitoring: Secondary | ICD-10-CM | POA: Diagnosis not present

## 2017-10-24 DIAGNOSIS — M722 Plantar fascial fibromatosis: Secondary | ICD-10-CM | POA: Insufficient documentation

## 2017-10-24 DIAGNOSIS — M5442 Lumbago with sciatica, left side: Secondary | ICD-10-CM | POA: Diagnosis not present

## 2017-10-24 DIAGNOSIS — E039 Hypothyroidism, unspecified: Secondary | ICD-10-CM | POA: Insufficient documentation

## 2017-10-24 DIAGNOSIS — D509 Iron deficiency anemia, unspecified: Secondary | ICD-10-CM | POA: Insufficient documentation

## 2017-10-24 DIAGNOSIS — M549 Dorsalgia, unspecified: Secondary | ICD-10-CM

## 2017-10-24 DIAGNOSIS — M533 Sacrococcygeal disorders, not elsewhere classified: Secondary | ICD-10-CM | POA: Diagnosis not present

## 2017-10-24 DIAGNOSIS — M542 Cervicalgia: Secondary | ICD-10-CM | POA: Insufficient documentation

## 2017-10-24 DIAGNOSIS — I7300 Raynaud's syndrome without gangrene: Secondary | ICD-10-CM | POA: Insufficient documentation

## 2017-10-24 DIAGNOSIS — M79605 Pain in left leg: Secondary | ICD-10-CM | POA: Diagnosis not present

## 2017-10-24 DIAGNOSIS — Z79899 Other long term (current) drug therapy: Secondary | ICD-10-CM

## 2017-10-24 DIAGNOSIS — M797 Fibromyalgia: Secondary | ICD-10-CM | POA: Diagnosis not present

## 2017-10-24 DIAGNOSIS — Z7982 Long term (current) use of aspirin: Secondary | ICD-10-CM | POA: Insufficient documentation

## 2017-10-24 DIAGNOSIS — M899 Disorder of bone, unspecified: Secondary | ICD-10-CM | POA: Diagnosis not present

## 2017-10-24 DIAGNOSIS — M5124 Other intervertebral disc displacement, thoracic region: Secondary | ICD-10-CM | POA: Insufficient documentation

## 2017-10-24 DIAGNOSIS — F329 Major depressive disorder, single episode, unspecified: Secondary | ICD-10-CM | POA: Insufficient documentation

## 2017-10-24 DIAGNOSIS — M79602 Pain in left arm: Secondary | ICD-10-CM | POA: Insufficient documentation

## 2017-10-24 DIAGNOSIS — M8580 Other specified disorders of bone density and structure, unspecified site: Secondary | ICD-10-CM | POA: Insufficient documentation

## 2017-10-24 DIAGNOSIS — M79604 Pain in right leg: Secondary | ICD-10-CM | POA: Diagnosis not present

## 2017-10-24 DIAGNOSIS — K219 Gastro-esophageal reflux disease without esophagitis: Secondary | ICD-10-CM | POA: Insufficient documentation

## 2017-10-24 DIAGNOSIS — Z881 Allergy status to other antibiotic agents status: Secondary | ICD-10-CM | POA: Insufficient documentation

## 2017-10-24 DIAGNOSIS — M4310 Spondylolisthesis, site unspecified: Secondary | ICD-10-CM | POA: Insufficient documentation

## 2017-10-24 DIAGNOSIS — E559 Vitamin D deficiency, unspecified: Secondary | ICD-10-CM | POA: Insufficient documentation

## 2017-10-24 DIAGNOSIS — Z79891 Long term (current) use of opiate analgesic: Secondary | ICD-10-CM | POA: Diagnosis not present

## 2017-10-24 DIAGNOSIS — Z789 Other specified health status: Secondary | ICD-10-CM

## 2017-10-24 DIAGNOSIS — M79601 Pain in right arm: Secondary | ICD-10-CM | POA: Diagnosis not present

## 2017-10-24 DIAGNOSIS — F1190 Opioid use, unspecified, uncomplicated: Secondary | ICD-10-CM | POA: Diagnosis not present

## 2017-10-24 DIAGNOSIS — I10 Essential (primary) hypertension: Secondary | ICD-10-CM | POA: Diagnosis not present

## 2017-10-24 DIAGNOSIS — E119 Type 2 diabetes mellitus without complications: Secondary | ICD-10-CM | POA: Insufficient documentation

## 2017-10-24 DIAGNOSIS — K589 Irritable bowel syndrome without diarrhea: Secondary | ICD-10-CM | POA: Insufficient documentation

## 2017-10-24 DIAGNOSIS — Z794 Long term (current) use of insulin: Secondary | ICD-10-CM | POA: Insufficient documentation

## 2017-10-24 DIAGNOSIS — E782 Mixed hyperlipidemia: Secondary | ICD-10-CM | POA: Insufficient documentation

## 2017-10-24 DIAGNOSIS — Z87891 Personal history of nicotine dependence: Secondary | ICD-10-CM | POA: Insufficient documentation

## 2017-10-24 DIAGNOSIS — Z880 Allergy status to penicillin: Secondary | ICD-10-CM | POA: Insufficient documentation

## 2017-10-24 DIAGNOSIS — Z79890 Hormone replacement therapy (postmenopausal): Secondary | ICD-10-CM | POA: Insufficient documentation

## 2017-10-24 DIAGNOSIS — Z6837 Body mass index (BMI) 37.0-37.9, adult: Secondary | ICD-10-CM | POA: Insufficient documentation

## 2017-10-24 DIAGNOSIS — F419 Anxiety disorder, unspecified: Secondary | ICD-10-CM | POA: Insufficient documentation

## 2017-10-24 NOTE — Patient Instructions (Addendum)
You have been instructed to get labwork, xrays and med psych eval.  You have been instructed to call and make an appointment after these things are complete.  ____________________________________________________________________________________________  Appointment Policy Summary  It is our goal and responsibility to provide the medical community with assistance in the evaluation and management of patients with chronic pain. Unfortunately our resources are limited. Because we do not have an unlimited amount of time, or available appointments, we are required to closely monitor and manage their use. The following rules exist to maximize their use:  Patient's responsibilities: 1. Punctuality:  At what time should I arrive? You should be physically present in our office 30 minutes before your scheduled appointment. Your scheduled appointment is with your assigned healthcare provider. However, it takes 5-10 minutes to be "checked-in", and another 15 minutes for the nurses to do the admission. If you arrive to our office at the time you were given for your appointment, you will end up being at least 20-25 minutes late to your appointment with the provider. 2. Tardiness:  What happens if I arrive only a few minutes after my scheduled appointment time? You will need to reschedule your appointment. The cutoff is your appointment time. This is why it is so important that you arrive at least 30 minutes before that appointment. If you have an appointment scheduled for 10:00 AM and you arrive at 10:01, you will be required to reschedule your appointment.  3. Plan ahead:  Always assume that you will encounter traffic on your way in. Plan for it. If you are dependent on a driver, make sure they understand these rules and the need to arrive early. 4. Other appointments and responsibilities:  Avoid scheduling any other appointments before or after your pain clinic appointments.  5. Be prepared:  Write down  everything that you need to discuss with your healthcare provider and give this information to the admitting nurse. Write down the medications that you will need refilled. Bring your pills and bottles (even the empty ones), to all of your appointments, except for those where a procedure is scheduled. 6. No children or pets:  Find someone to take care of them. It is not appropriate to bring them in. 7. Scheduling changes:  We request "advanced notification" of any changes or cancellations. 8. Advanced notification:  Defined as a time period of more than 24 hours prior to the originally scheduled appointment. This allows for the appointment to be offered to other patients. 9. Rescheduling:  When a visit is rescheduled, it will require the cancellation of the original appointment. For this reason they both fall within the category of "Cancellations".  10. Cancellations:  They require advanced notification. Any cancellation less than 24 hours before the  appointment will be recorded as a "No Show". 11. No Show:  Defined as an unkept appointment where the patient failed to notify or declare to the practice their intention or inability to keep the appointment.  Corrective process for repeat offenders:  1. Tardiness: Three (3) episodes of rescheduling due to late arrivals will be recorded as one (1) "No Show". 2. Cancellation or reschedule: Three (3) cancellations or rescheduling will be recorded as one (1) "No Show". 3. "No Shows": Three (3) "No Shows" within a 12 month period will result in discharge from the practice.  ____________________________________________________________________________________________   ____________________________________________________________________________________________  Pain Scale  Introduction: The pain score used by this practice is the Verbal Numerical Rating Scale (VNRS-11). This is an 11-point scale. It is  for adults and children 10 years or older. There  are significant differences in how the pain score is reported, used, and applied. Forget everything you learned in the past and learn this scoring system.  General Information: The scale should reflect your current level of pain. Unless you are specifically asked for the level of your worst pain, or your average pain. If you are asked for one of these two, then it should be understood that it is over the past 24 hours.  Basic Activities of Daily Living (ADL): Personal hygiene, dressing, eating, transferring, and using restroom.  Instructions: Most patients tend to report their level of pain as a combination of two factors, their physical pain and their psychosocial pain. This last one is also known as "suffering" and it is reflection of how physical pain affects you socially and psychologically. From now on, report them separately. From this point on, when asked to report your pain level, report only your physical pain. Use the following table for reference.  Pain Clinic Pain Levels (0-5/10)  Pain Level Score  Description  No Pain 0   Mild pain 1 Nagging, annoying, but does not interfere with basic activities of daily living (ADL). Patients are able to eat, bathe, get dressed, toileting (being able to get on and off the toilet and perform personal hygiene functions), transfer (move in and out of bed or a chair without assistance), and maintain continence (able to control bladder and bowel functions). Blood pressure and heart rate are unaffected. A normal heart rate for a healthy adult ranges from 60 to 100 bpm (beats per minute).   Mild to moderate pain 2 Noticeable and distracting. Impossible to hide from other people. More frequent flare-ups. Still possible to adapt and function close to normal. It can be very annoying and may have occasional stronger flare-ups. With discipline, patients may get used to it and adapt.   Moderate pain 3 Interferes significantly with activities of daily living (ADL).  It becomes difficult to feed, bathe, get dressed, get on and off the toilet or to perform personal hygiene functions. Difficult to get in and out of bed or a chair without assistance. Very distracting. With effort, it can be ignored when deeply involved in activities.   Moderately severe pain 4 Impossible to ignore for more than a few minutes. With effort, patients may still be able to manage work or participate in some social activities. Very difficult to concentrate. Signs of autonomic nervous system discharge are evident: dilated pupils (mydriasis); mild sweating (diaphoresis); sleep interference. Heart rate becomes elevated (>115 bpm). Diastolic blood pressure (lower number) rises above 100 mmHg. Patients find relief in laying down and not moving.   Severe pain 5 Intense and extremely unpleasant. Associated with frowning face and frequent crying. Pain overwhelms the senses.  Ability to do any activity or maintain social relationships becomes significantly limited. Conversation becomes difficult. Pacing back and forth is common, as getting into a comfortable position is nearly impossible. Pain wakes you up from deep sleep. Physical signs will be obvious: pupillary dilation; increased sweating; goosebumps; brisk reflexes; cold, clammy hands and feet; nausea, vomiting or dry heaves; loss of appetite; significant sleep disturbance with inability to fall asleep or to remain asleep. When persistent, significant weight loss is observed due to the complete loss of appetite and sleep deprivation.  Blood pressure and heart rate becomes significantly elevated. Caution: If elevated blood pressure triggers a pounding headache associated with blurred vision, then the patient should immediately seek  attention at an urgent or emergency care unit, as these may be signs of an impending stroke.    Emergency Department Pain Levels (6-10/10)  Emergency Room Pain 6 Severely limiting. Requires emergency care and should not  be seen or managed at an outpatient pain management facility. Communication becomes difficult and requires great effort. Assistance to reach the emergency department may be required. Facial flushing and profuse sweating along with potentially dangerous increases in heart rate and blood pressure will be evident.   Distressing pain 7 Self-care is very difficult. Assistance is required to transport, or use restroom. Assistance to reach the emergency department will be required. Tasks requiring coordination, such as bathing and getting dressed become very difficult.   Disabling pain 8 Self-care is no longer possible. At this level, pain is disabling. The individual is unable to do even the most "basic" activities such as walking, eating, bathing, dressing, transferring to a bed, or toileting. Fine motor skills are lost. It is difficult to think clearly.   Incapacitating pain 9 Pain becomes incapacitating. Thought processing is no longer possible. Difficult to remember your own name. Control of movement and coordination are lost.   The worst pain imaginable 10 At this level, most patients pass out from pain. When this level is reached, collapse of the autonomic nervous system occurs, leading to a sudden drop in blood pressure and heart rate. This in turn results in a temporary and dramatic drop in blood flow to the brain, leading to a loss of consciousness. Fainting is one of the body's self defense mechanisms. Passing out puts the brain in a calmed state and causes it to shut down for a while, in order to begin the healing process.    Summary: 1. Refer to this scale when providing [place] with your pain level. 2. Be accurate and careful when reporting your pain level. This will help with your care. 3. Over-reporting your pain level will lead to loss of credibility. 4. Even a level of 1/10 means that there is pain and will be treated at our facility. 5. High, inaccurate reporting will be documented as "Symptom  Exaggeration", leading to loss of credibility and suspicions of possible secondary gains such as obtaining more narcotics, or wanting to appear disabled, for fraudulent reasons. 6. Only pain levels of 5 or below will be seen at our facility. 7. Pain levels of 6 and above will be sent to the Emergency Department and the appointment cancelled. ____________________________________________________________________________________________

## 2017-10-24 NOTE — Progress Notes (Signed)
Safety precautions to be maintained throughout the outpatient stay will include: orient to surroundings, keep bed in low position, maintain call bell within reach at all times, provide assistance with transfer out of bed and ambulation.  

## 2017-10-24 NOTE — Progress Notes (Signed)
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 reviewed.  [place]  Drug: Ms. Tanton  reports that she does not use drugs. Alcohol:  reports that she does not drink alcohol. Tobacco:  reports that she has quit smoking. Her smoking use included cigarettes. she has never used smokeless tobacco. Medical:  has a past medical history of Anemia, iron deficiency, Bilateral lower extremity edema, Cervical dysplasia, Diabetes mellitus type II, Diverticulosis, DJD (degenerative joint disease), Dry eyes, Edema, Fibromyalgia, GERD (gastroesophageal reflux disease), Hemorrhoids, HLD (hyperlipidemia), HNP (herniated nucleus pulposus) (2/99), HTN (hypertension), Hypothyroidism, Interstitial lung disease ([place]), Left ovarian cyst, Osteoarthritis, Osteopenia, Palpitations, Raynaud's disease, and Recurrent HSV (herpes simplex virus). Family: family history includes Anemia in her brother; Coronary artery disease in her mother; Diabetes in her brother and sister; Emphysema in her father; Heart disease in her brother; Lung cancer in her mother; Lymphoma in her brother and sister.  Past Surgical History:   Procedure Laterality Date  . COLONOSCOPY  11/01   Diverticulosis; hemorrhoids  . HAND SURGERY     left thumb  . LASIK     bilateral   Active Ambulatory Problems    Diagnosis Date Noted  . Hypothyroidism 03/28/2007  . Hyperglycemia 05/13/2008  . Vitamin D deficiency 05/13/2008  . Mixed hyperlipidemia 03/28/2007  . Other iron deficiency anemias 03/28/2007  . ANXIETY 03/25/2008  . PANIC ATTACK 03/28/2007  . DEPRESSION 06/14/2008  . KERATOCONJUNCTIVITIS SICCA 03/28/2007  . Mitral valve disorder 03/28/2007  . ABNORMAL HEART RHYTHMS 03/28/2007  . RAYNAUD'S SYNDROME 03/28/2007  . ESOPHAGITIS 03/28/2007  . GERD 03/28/2007  . GASTRITIS 03/28/2007  . IBS 03/28/2007  . ROSACEA 03/28/2007  . OSTEOARTHRITIS 03/28/2007  . BACK PAIN 03/28/2007  . PLANTAR FASCIITIS 03/28/2007  . FIBROMYALGIA 03/28/2007  . OSTEOPENIA 03/25/2008  . SOMATIC DYSFUNCTION 12/11/2009  . PALPITATIONS 04/27/2010  . MIGRAINES, HX OF 03/28/2007  . PERSONAL HISTORY ALLERGY UNSPEC MEDICINAL AGENT 03/18/2010  . Routine general medical examination at a health care facility 03/28/2011  . Drug rash 05/22/2011  . HTN (hypertension) 07/30/2011  . Raynaud disease 07/30/2011  . Obesity 07/30/2011  . Other screening mammogram 08/18/2011  . Lumbar disc disease with radiculopathy 11/18/2011  . Degenerative disk disease 11/19/2011  . Pedal edema 01/28/2012  . Interstitial lung disease ([place]) 03/21/2012  . Elevated liver enzymes 03/21/2012  . Rhinitis 03/21/2012  . Post-menopausal 06/23/2012  . Cough 08/08/2012  . Bilateral lower extremity edema   . Acute renal insufficiency 05/31/2014  . DM type 2 (diabetes mellitus, type 2) ([place]) 06/14/2014  . Encounter for  routine gynecological examination 06/14/2014  . Colon cancer screening 06/14/2014  . Leg edema 09/29/2015  . Chronic upper back pain (Primary Area of Pain) (midline) 10/24/2017  . Chronic neck pain (Secondary Area of Pain) (Bilateral)  (L>R) 10/24/2017  .  Chronic upper extremity pain [facility] Area of Pain) (Bilateral) (L>R) 10/24/2017  . Chronic low back pain (Fourth Area of Pain) (Bilateral) (L>R) 10/24/2017  . Chronic pain of lower extremity (Bilateral) (L>R) 10/24/2017  . Disorder of bone, unspecified 10/24/2017  . Other long term (current) drug therapy 10/24/2017  . Other specified health status 10/24/2017  . Long term current use of opiate analgesic 10/24/2017  . Long term prescription opiate use 10/24/2017  . Opiate use 10/24/2017   Resolved Ambulatory Problems    Diagnosis Date Noted  . UTI 03/18/2010  . SYMPTOM, EDEMA 03/29/2007  . Urine frequency 05/13/2011  . Acute bacterial sinusitis 01/31/2012  . Steroid long-term use 06/23/2012  . Sore throat 06/16/2014   Past Medical History:  Diagnosis Date  . Anemia, iron deficiency   . Bilateral lower extremity edema   . Cervical dysplasia   . Diabetes mellitus type II   . Diverticulosis   . DJD (degenerative joint disease)   . Dry eyes   . Edema   . Fibromyalgia   . GERD (gastroesophageal reflux disease)   . Hemorrhoids   . HLD (hyperlipidemia)   . HNP (herniated nucleus pulposus) 2/99  . HTN (hypertension)   . Hypothyroidism   . Interstitial lung disease ([place])   . Left ovarian cyst   . Osteoarthritis   . Osteopenia   . Palpitations   . Raynaud's disease   . Recurrent HSV (herpes simplex virus)    Constitutional Exam  General appearance: Well nourished, well developed, and well hydrated. In no apparent acute distress Vitals:   10/24/17 1019  BP: 125/87  Pulse: 90  Resp: 18  Temp: 99 F (37.2 C)  SpO2: 93%  Weight: 200 lb (90.7 kg)  Height: 5' 1"  (1.549 m)   BMI Assessment: Estimated body mass index is 37.79 kg/m as calculated from the following:   Height as of this encounter: 5' 1"  (1.549 m).   Weight as of this encounter: 200 lb (90.7 kg).  BMI interpretation table: BMI level Category Range association with higher incidence of chronic pain  <18 kg/m2  Underweight   18.5-24.9 kg/m2 Ideal body weight   25-29.9 kg/m2 Overweight Increased incidence by 20%  30-34.9 kg/m2 Obese (Class I) Increased incidence by 68%  35-39.9 kg/m2 Severe obesity (Class II) Increased incidence by 136%  >40 kg/m2 Extreme obesity (Class III) Increased incidence by 254%   BMI Readings from Last 4 Encounters:  10/24/17 37.79 kg/m  09/19/17 36.53 kg/m  08/19/17 36.95 kg/m  08/10/17 36.11 kg/m   Wt Readings from Last 4 Encounters:  10/24/17 200 lb (90.7 kg)  09/19/17 196 lb 8 oz (89.1 kg)  08/19/17 198 lb 12 oz (90.2 kg)  08/10/17 194 lb 4 oz (88.1 kg)  Psych/Mental status: Alert, oriented x 3 (person, place, & time)       Eyes: PERLA Respiratory: No evidence of acute respiratory distress  Cervical Spine Exam  Inspection: Well healed scar from previous spine surgery detected Alignment: Symmetrical Functional ROM: Unrestricted ROM      Stability: No instability detected Muscle strength & Tone: Functionally intact Sensory: Unimpaired Palpation: Complains of area being tender to palpation              Upper Extremity (  UE) Exam    Side: Right upper extremity  Side: Left upper extremity  Inspection: No masses, redness, swelling, or asymmetry. No contractures  Inspection: No masses, redness, swelling, or asymmetry. No contractures  Functional ROM: Unrestricted ROM          Functional ROM: Unrestricted ROM          Muscle strength & Tone: Functionally intact  Muscle strength & Tone: Functionally intact  Sensory: Unimpaired  Sensory: Unimpaired  Palpation: No palpable anomalies              Palpation: No palpable anomalies              Specialized Test(s): Deferred         Specialized Test(s): Deferred          Thoracic Spine Exam  Inspection: No masses, redness, or swelling Alignment: Symmetrical Functional ROM: Unrestricted ROM Stability: No instability detected Sensory: Unimpaired Muscle strength & Tone: Complains of area being tender to  palpation  Lumbar Spine Exam  Inspection: No masses, redness, or swelling Alignment: Symmetrical Functional ROM: Unrestricted ROM      Stability: No instability detected Muscle strength & Tone: Functionally intact Sensory: Unimpaired Palpation: No palpable anomalies       Provocative Tests: Lumbar Hyperextension and rotation test: Positive bilaterally for facet joint pain.leg raises positive for greater than 45 Patrick's Maneuver: Positive for left-sided S-I arthralgia              Gait & Posture Assessment  Ambulation: Unassisted Gait: Relatively normal for age and body habitus Posture: WNL   Lower Extremity Exam    Side: Right lower extremity  Side: Left lower extremity  Inspection: No masses, redness, swelling, or asymmetry. No contractures  Inspection: No masses, redness, swelling, or asymmetry. No contractures  Functional ROM: Unrestricted ROM          Functional ROM: Unrestricted ROM          Muscle strength & Tone: Able to Toe-walk & Heel-walk without problems  Muscle strength & Tone: Able to Toe-walk & Heel-walk without problems  Sensory: Unimpaired  Sensory: Unimpaired  Palpation: No palpable anomalies  Palpation: No palpable anomalies   Assessment  Primary Diagnosis & Pertinent Problem List: The primary encounter diagnosis was Chronic upper back pain (Primary Area of Pain) (midline). Diagnoses of Chronic neck pain (Secondary Area of Pain) (Bilateral)  (L>R), Chronic pain of both upper extremities, Chronic bilateral low back pain with bilateral sciatica, Chronic pain of both lower extremities, Disorder of bone, unspecified, Other long term (current) drug therapy, Other specified health status, Long term current use of opiate analgesic, Long term prescription opiate use, Opiate use, and Chronic sacroiliac joint pain were also pertinent to this visit.  Visit Diagnosis: 1. Chronic upper back pain (Primary Area of Pain) (midline)   2. Chronic neck pain (Secondary Area of Pain)  (Bilateral)  (L>R)   3. Chronic pain of both upper extremities   4. Chronic bilateral low back pain with bilateral sciatica   5. Chronic pain of both lower extremities   6. Disorder of bone, unspecified   7. Other long term (current) drug therapy   8. Other specified health status   9. Long term current use of opiate analgesic   10. Long term prescription opiate use   11. Opiate use   12. Chronic sacroiliac joint pain    Plan of Care  Initial treatment plan:  Please be advised that as per protocol, today's visit has  been an evaluation only. We have not taken over the patient's controlled substance management.  Problem-specific plan: No problem-specific Assessment & Plan notes found for this encounter.  Ordered Lab-work, Procedure(s), Referral(s), & Consult(s): Orders Placed This Encounter  Procedures  . DG Si Joints  . Compliance Drug Analysis, Ur  . Comp. Metabolic Panel (12)  . Magnesium  . Vitamin B12  . Sedimentation rate  . 25-Hydroxyvitamin D Lcms D2+D3  . C-reactive protein  . Ambulatory referral to Psychology   Pharmacotherapy: Medications ordered:  No orders of the defined types were placed in this encounter.  Medications administered during this visit: Shernita Rabinovich. Marucci "Jenny Reichmann" had no medications administered during this visit.   Pharmacotherapy under consideration:  Opioid Analgesics: The patient was informed that there is no guarantee that she would be a candidate for opioid analgesics. The decision will be made following CDC guidelines. This decision will be based on the results of diagnostic studies, as well as Ms. Maeder risk profile.  Membrane stabilizer: To be determined at a later time Muscle relaxant: To be determined at a later time NSAID: To be determined at a later time Other analgesic(s): To be determined at a later time   Interventional therapies under consideration: Ms. Rambeau was informed that there is no guarantee that she would be a candidate for  interventional therapies. The decision will be based on the results of diagnostic studies, as well as Ms. Vuncannon risk profile.  Possible procedure(s): Diagnostic midline thoracic facet Diagnostic bilateral CESI  Diagnostics bilateral cervical facet nerve block Possible bilateral cervical facet RFA Diagnostic bilateral LESI Diagnostic bilateral lumbar facet nerve block Possible bilateral lumbar facet RFA  Diagnostic left SI joint injection    Provider-requested follow-up: Return for 2nd Visit, w/ Dr. Dossie Arbour, after MedPsych eval.  Future Appointments  Date Time Provider [place]  08/14/2018 10:30 AM Eustace Pen, LPN [facility] [place]  11/24/7410  9:30 AM Tower, [name], MD [facility]    Primary Care Physician: Tower, [name], MD Location: [facility] Outpatient [facility] Management Facility Note by:  Date: 10/24/2017; Time: 2:41 PM  Pain Score Disclaimer: We use the NRS-11 scale. This is a self-reported, subjective measurement of pain severity with only modest accuracy. It is used primarily to identify changes within a particular patient. It must be understood that outpatient pain scales are significantly less accurate that those used for research, where they can be applied under ideal controlled circumstances with minimal exposure to variables. In reality, the score is likely to be a combination of pain intensity and pain affect, where pain affect describes the degree of emotional arousal or changes in action readiness caused by the sensory experience of pain. Factors such as social and work situation, setting, emotional state, anxiety levels, expectation, and prior pain experience may influence pain perception and show large inter-individual differences that may also be affected by time variables.  Patient instructions provided during this appointment: Patient Instructions   You have been instructed to get labwork, xrays and med psych eval.  You have been instructed to call and make an  appointment after these things are complete.  ____________________________________________________________________________________________  Appointment Policy Summary  It is our goal and responsibility to provide the medical community with assistance in the evaluation and management of patients with chronic pain. Unfortunately our resources are limited. Because we do not have an unlimited amount of time, or available appointments, we are required to closely monitor and manage their use. The following rules exist to maximize their  use:  Patient's responsibilities: 1. Punctuality:  At what time should I arrive? You should be physically present in our office 30 minutes before your scheduled appointment. Your scheduled appointment is with your assigned healthcare provider. However, it takes 5-10 minutes to be "checked-in", and another 15 minutes for the nurses to do the admission. If you arrive to our office at the time you were given for your appointment, you will end up being at least 20-25 minutes late to your appointment with the provider. 2. Tardiness:  What happens if I arrive only a few minutes after my scheduled appointment time? You will need to reschedule your appointment. The cutoff is your appointment time. This is why it is so important that you arrive at least 30 minutes before that appointment. If you have an appointment scheduled for 10:00 AM and you arrive at 10:01, you will be required to reschedule your appointment.  3. Plan ahead:  Always assume that you will encounter traffic on your way in. Plan for it. If you are dependent on a driver, make sure they understand these rules and the need to arrive early. 4. Other appointments and responsibilities:  Avoid scheduling any other appointments before or after your pain clinic appointments.  5. Be prepared:  Write down everything that you need to discuss with your healthcare provider and give this information to the admitting nurse. Write  down the medications that you will need refilled. Bring your pills and bottles (even the empty ones), to all of your appointments, except for those where a procedure is scheduled. 6. No children or pets:  Find someone to take care of them. It is not appropriate to bring them in. 7. Scheduling changes:  We request "advanced notification" of any changes or cancellations. 8. Advanced notification:  Defined as a time period of more than 24 hours prior to the originally scheduled appointment. This allows for the appointment to be offered to other patients. 9. Rescheduling:  When a visit is rescheduled, it will require the cancellation of the original appointment. For this reason they both fall within the category of "Cancellations".  10. Cancellations:  They require advanced notification. Any cancellation less than 24 hours before the  appointment will be recorded as a "No Show". 11. No Show:  Defined as an unkept appointment where the patient failed to notify or declare to the practice their intention or inability to keep the appointment.  Corrective process for repeat offenders:  1. Tardiness: Three (3) episodes of rescheduling due to late arrivals will be recorded as one (1) "No Show". 2. Cancellation or reschedule: Three (3) cancellations or rescheduling will be recorded as one (1) "No Show". 3. "No Shows": Three (3) "No Shows" within a 12 month period will result in discharge from the practice.  ____________________________________________________________________________________________   ____________________________________________________________________________________________  Pain Scale  Introduction: The pain score used by this practice is the Verbal Numerical Rating Scale (VNRS-11). This is an 11-point scale. It is for adults and children 10 years or older. There are significant differences in how the pain score is reported, used, and applied. Forget everything you learned in the  past and learn this scoring system.  General Information: The scale should reflect your current level of pain. Unless you are specifically asked for the level of your worst pain, or your average pain. If you are asked for one of these two, then it should be understood that it is over the past 24 hours.  Basic Activities of Daily Living (ADL): Personal  hygiene, dressing, eating, transferring, and using restroom.  Instructions: Most patients tend to report their level of pain as a combination of two factors, their physical pain and their psychosocial pain. This last one is also known as "suffering" and it is reflection of how physical pain affects you socially and psychologically. From now on, report them separately. From this point on, when asked to report your pain level, report only your physical pain. Use the following table for reference.  Pain Clinic Pain Levels (0-5/10)  Pain Level Score  Description  No Pain 0   Mild pain 1 Nagging, annoying, but does not interfere with basic activities of daily living (ADL). Patients are able to eat, bathe, get dressed, toileting (being able to get on and off the toilet and perform personal hygiene functions), transfer (move in and out of bed or a chair without assistance), and maintain continence (able to control bladder and bowel functions). Blood pressure and heart rate are unaffected. A normal heart rate for a healthy adult ranges from 60 to 100 bpm (beats per minute).   Mild to moderate pain 2 Noticeable and distracting. Impossible to hide from other people. More frequent flare-ups. Still possible to adapt and function close to normal. It can be very annoying and may have occasional stronger flare-ups. With discipline, patients may get used to it and adapt.   Moderate pain 3 Interferes significantly with activities of daily living (ADL). It becomes difficult to feed, bathe, get dressed, get on and off the toilet or to perform personal hygiene functions.  Difficult to get in and out of bed or a chair without assistance. Very distracting. With effort, it can be ignored when deeply involved in activities.   Moderately severe pain 4 Impossible to ignore for more than a few minutes. With effort, patients may still be able to manage work or participate in some social activities. Very difficult to concentrate. Signs of autonomic nervous system discharge are evident: dilated pupils (mydriasis); mild sweating (diaphoresis); sleep interference. Heart rate becomes elevated (>115 bpm). Diastolic blood pressure (lower number) rises above 100 mmHg. Patients find relief in laying down and not moving.   Severe pain 5 Intense and extremely unpleasant. Associated with frowning face and frequent crying. Pain overwhelms the senses.  Ability to do any activity or maintain social relationships becomes significantly limited. Conversation becomes difficult. Pacing back and forth is common, as getting into a comfortable position is nearly impossible. Pain wakes you up from deep sleep. Physical signs will be obvious: pupillary dilation; increased sweating; goosebumps; brisk reflexes; cold, clammy hands and feet; nausea, vomiting or dry heaves; loss of appetite; significant sleep disturbance with inability to fall asleep or to remain asleep. When persistent, significant weight loss is observed due to the complete loss of appetite and sleep deprivation.  Blood pressure and heart rate becomes significantly elevated. Caution: If elevated blood pressure triggers a pounding headache associated with blurred vision, then the patient should immediately seek attention at an urgent or emergency care unit, as these may be signs of an impending stroke.    Emergency Department Pain Levels (6-10/10)  Emergency Room Pain 6 Severely limiting. Requires emergency care and should not be seen or managed at an outpatient pain management facility. Communication becomes difficult and requires great  effort. Assistance to reach the emergency department may be required. Facial flushing and profuse sweating along with potentially dangerous increases in heart rate and blood pressure will be evident.   Distressing pain 7 Self-care is very difficult.  Assistance is required to transport, or use restroom. Assistance to reach the emergency department will be required. Tasks requiring coordination, such as bathing and getting dressed become very difficult.   Disabling pain 8 Self-care is no longer possible. At this level, pain is disabling. The individual is unable to do even the most "basic" activities such as walking, eating, bathing, dressing, transferring to a bed, or toileting. Fine motor skills are lost. It is difficult to think clearly.   Incapacitating pain 9 Pain becomes incapacitating. Thought processing is no longer possible. Difficult to remember your own name. Control of movement and coordination are lost.   The worst pain imaginable 10 At this level, most patients pass out from pain. When this level is reached, collapse of the autonomic nervous system occurs, leading to a sudden drop in blood pressure and heart rate. This in turn results in a temporary and dramatic drop in blood flow to the brain, leading to a loss of consciousness. Fainting is one of the body's self defense mechanisms. Passing out puts the brain in a calmed state and causes it to shut down for a while, in order to begin the healing process.    Summary: 1. Refer to this scale when providing [place] with your pain level. 2. Be accurate and careful when reporting your pain level. This will help with your care. 3. Over-reporting your pain level will lead to loss of credibility. 4. Even a level of 1/10 means that there is pain and will be treated at our facility. 5. High, inaccurate reporting will be documented as "Symptom Exaggeration", leading to loss of credibility and suspicions of possible secondary gains such as obtaining more  narcotics, or wanting to appear disabled, for fraudulent reasons. 6. Only pain levels of 5 or below will be seen at our facility. 7. Pain levels of 6 and above will be sent to the Emergency Department and the appointment cancelled. ____________________________________________________________________________________________

## 2017-10-28 LAB — COMPLIANCE DRUG ANALYSIS, UR

## 2017-10-30 LAB — C-REACTIVE PROTEIN: CRP: 4.7 mg/L (ref 0.0–4.9)

## 2017-10-30 LAB — COMP. METABOLIC PANEL (12)
AST: 18 IU/L (ref 0–40)
Albumin/Globulin Ratio: 2 (ref 1.2–2.2)
Albumin: 4.9 g/dL — ABNORMAL HIGH (ref 3.6–4.8)
Alkaline Phosphatase: 89 IU/L (ref 39–117)
BUN / CREAT RATIO: 25 (ref 12–28)
BUN: 25 mg/dL (ref 8–27)
CALCIUM: 9.4 mg/dL (ref 8.7–10.3)
Chloride: 94 mmol/L — ABNORMAL LOW (ref 96–106)
Creatinine, Ser: 1 mg/dL (ref 0.57–1.00)
GFR calc non Af Amer: 58 mL/min/{1.73_m2} — ABNORMAL LOW (ref >59)
GFR, EST AFRICAN AMERICAN: 67 mL/min/{1.73_m2} (ref >59)
Globulin, Total: 2.5 g/dL (ref 1.5–4.5)
Glucose: 100 mg/dL — ABNORMAL HIGH (ref 65–99)
Potassium: 4.9 mmol/L (ref 3.5–5.2)
Sodium: 140 mmol/L (ref 134–144)
TOTAL PROTEIN: 7.4 g/dL (ref 6.0–8.5)

## 2017-10-30 LAB — VITAMIN B12: Vitamin B-12: 946 pg/mL (ref 232–1245)

## 2017-10-30 LAB — 25-HYDROXY VITAMIN D LCMS D2+D3: 25-Hydroxy, Vitamin D-3: 65 ng/mL

## 2017-10-30 LAB — MAGNESIUM: MAGNESIUM: 2.2 mg/dL (ref 1.6–2.3)

## 2017-10-30 LAB — 25-HYDROXYVITAMIN D LCMS D2+D3: 25-HYDROXY, VITAMIN D: 66 ng/mL

## 2017-10-30 LAB — SEDIMENTATION RATE: SED RATE: 38 mm/h (ref 0–40)

## 2017-11-07 NOTE — Progress Notes (Signed)
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 Rfl: 3 .  GUAIFENESIN CR PO, Take by mouth as needed.  , Disp: , Rfl:  .  HYDROmorphone HCl (EXALGO) 12 MG T24A SR tablet, Take 12 mg by mouth daily., Disp: , Rfl:  .  HYDROmorphone HCl 16 MG T24A, Take 1 tablet by mouth daily., Disp: , Rfl:  .  hyoscyamine (LEVSIN SL) 0.125 MG SL tablet, Take 1 tablet (0.125 mg total) by mouth every 4 (four) hours as needed for cramping., Disp: 270 tablet, Rfl: 0 .  levothyroxine (SYNTHROID, LEVOTHROID) 150 MCG tablet, Take 1 tablet (150 mcg total) by mouth daily., Disp: 90 tablet, Rfl: 3 .  Magnesium 400 MG CAPS, Take by mouth daily. , Disp: , Rfl:  .  metFORMIN (GLUCOPHAGE) 500 MG tablet, Take 1 tablet (500 mg total) by mouth 2 (two) times daily with a meal., Disp: 180 tablet, Rfl: 3 .  metoprolol succinate (TOPROL-XL) 50 MG 24 hr tablet,  Take 1 tablet (50 mg total) by mouth 2 (two) times daily., Disp: 180 tablet, Rfl: 3 .  oxyCODONE (OXYCONTIN) 10 MG 12 hr tablet, Take 10 mg by mouth 3 (three) times daily as needed. , Disp: , Rfl:  .  Probiotic Product (PROBIOTIC PO), Take 1 capsule by mouth daily., Disp: , Rfl:  .  ramipril (ALTACE) 5 MG capsule, Take 1 capsule (5 mg total) by mouth daily., Disp: 90 capsule, Rfl: 3 .  ranitidine (ZANTAC) 300 MG tablet, Take 1 tablet (300 mg total) by mouth at bedtime., Disp: 90 tablet, Rfl: 3 .  torsemide (DEMADEX) 20 MG tablet, Take 1 tablet by mouth two  times daily as needed, Disp: 180 tablet, Rfl: 3  ROS  Constitutional: Denies any fever or chills Gastrointestinal: No reported hemesis, hematochezia, vomiting, or acute GI distress Musculoskeletal: Denies any acute onset joint swelling, redness, loss of ROM, or weakness Neurological: No reported episodes of acute onset apraxia, aphasia, dysarthria, agnosia, amnesia, paralysis, loss of coordination, or loss of consciousness  Allergies  Ms. Littman is allergic to keflex [cephalexin]; penicillins; amitriptyline hcl; atorvastatin; benicar [olmesartan medoxomil]; ceftin [cefuroxime]; cetirizine hcl; ciprofloxacin; clonidine derivatives; diltiazem hcl; diovan [valsartan]; erythromycin; etodolac; fluoxetine hcl; furosemide; gabapentin; naproxen sodium; paroxetine; pregabalin; sulfonamide derivatives; tetracycline; venlafaxine; and cephalexin.  [place]  Drug: Ms. Pressly  reports that she does not use drugs. Alcohol:  reports that she does not drink alcohol. Tobacco:  reports that she has quit smoking. Her smoking use included cigarettes. she has never  used smokeless tobacco. Medical:  has a past medical history of Acute renal insufficiency (05/31/2014), Anemia, iron deficiency, Bilateral lower extremity edema, Cervical dysplasia, Colon cancer screening (06/14/2014), Cough (08/08/2012), Degenerative disk disease (11/19/2011), Diabetes mellitus type II,  Diverticulosis, DJD (degenerative joint disease), Drug rash (05/22/2011), Dry eyes, Edema, Elevated liver enzymes (03/21/2012), Encounter for routine gynecological examination (06/14/2014), ESOPHAGITIS (03/28/2007), Fibromyalgia, GASTRITIS (03/28/2007), GERD (gastroesophageal reflux disease), Hemorrhoids, HLD (hyperlipidemia), HNP (herniated nucleus pulposus) (2/99), HTN (hypertension), Hyperglycemia (05/13/2008), Hypothyroidism, Interstitial lung disease ([place]), Left ovarian cyst, Osteoarthritis, Osteopenia, Other screening mammogram (08/18/2011), Palpitations, 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 Surgical: Ms. Candy  has a past surgical history that includes Hand surgery; Colonoscopy (11/01); and LASIK. Family: family history includes Anemia in her brother; Coronary artery disease in her mother; Diabetes in her brother and sister; Emphysema in her father; Heart disease in her brother; Lung cancer in her mother; Lymphoma in her brother and sister.  Constitutional Exam  General appearance: Well nourished, well developed, and well hydrated. In no apparent acute distress Vitals:   11/09/17 1041  BP: 133/85  Pulse: 85  Resp: 16  Temp: 98.5 F (36.9 C)  SpO2: 96%  Weight: 200 lb (90.7 kg)  Height: 5' 1.5" (1.562 m)   BMI Assessment: Estimated body mass index is 37.18 kg/m as calculated from the following:   Height as of this encounter: 5' 1.5" (1.562 m).   Weight as of this encounter: 200 lb (90.7 kg).  BMI interpretation table: BMI level Category Range association with higher incidence of chronic pain  <18 kg/m2 Underweight   18.5-24.9 kg/m2 Ideal body weight   25-29.9 kg/m2 Overweight Increased incidence by 20%  30-34.9 kg/m2 Obese (Class I) Increased incidence by 68%  35-39.9 kg/m2 Severe obesity (Class II) Increased  incidence by 136%  >40 kg/m2 Extreme obesity (Class III) Increased incidence by 254%   BMI Readings from Last 4 Encounters:  11/09/17 37.18 kg/m  10/24/17 37.79 kg/m  09/19/17 36.53 kg/m  08/19/17 36.95 kg/m   Wt Readings from Last 4 Encounters:  11/09/17 200 lb (90.7 kg)  10/24/17 200 lb (90.7 kg)  09/19/17 196 lb 8 oz (89.1 kg)  08/19/17 198 lb 12 oz (90.2 kg)  Psych/Mental status: Alert, oriented x 3 (person, place, & time)       Eyes: PERLA Respiratory: No evidence of acute respiratory distress  Cervical Spine Area Exam  Skin & Axial Inspection: No masses, redness, edema, swelling, or associated skin lesions Alignment: Symmetrical Functional ROM: Unrestricted ROM      Stability: No instability detected Muscle Tone/Strength: Functionally intact. No obvious neuro-muscular anomalies detected. Sensory (Neurological): Unimpaired Palpation: No palpable anomalies              Upper Extremity (UE) Exam    Side: Right upper extremity  Side: Left upper extremity  Skin & Extremity Inspection: Skin color, temperature, and hair growth are WNL. No peripheral edema or cyanosis. No masses, redness, swelling, asymmetry, or associated skin lesions. No contractures.  Skin & Extremity Inspection: Skin color, temperature, and hair growth are WNL. No peripheral edema or cyanosis. No masses, redness, swelling, asymmetry, or associated skin lesions. No contractures.  Functional ROM: Unrestricted ROM          Functional ROM: Unrestricted ROM          Muscle Tone/Strength: Functionally intact. No obvious neuro-muscular anomalies detected.  Muscle  Tone/Strength: Functionally intact. No obvious neuro-muscular anomalies detected.  Sensory (Neurological): Unimpaired          Sensory (Neurological): Unimpaired          Palpation: No palpable anomalies              Palpation: No palpable anomalies              Specialized Test(s): Deferred         Specialized Test(s): Deferred          Thoracic Spine  Area Exam  Skin & Axial Inspection: No masses, redness, or swelling Alignment: Symmetrical Functional ROM: Unrestricted ROM Stability: No instability detected Muscle Tone/Strength: Functionally intact. No obvious neuro-muscular anomalies detected. Sensory (Neurological): Unimpaired Muscle strength & Tone: No palpable anomalies  Lumbar Spine Area Exam  Skin & Axial Inspection: No masses, redness, or swelling Alignment: Symmetrical Functional ROM: Unrestricted ROM      Stability: No instability detected Muscle Tone/Strength: Functionally intact. No obvious neuro-muscular anomalies detected. Sensory (Neurological): Unimpaired Palpation: No palpable anomalies       Provocative Tests: Lumbar Hyperextension and rotation test: evaluation deferred today       Lumbar Lateral bending test: evaluation deferred today       Patrick's Maneuver: evaluation deferred today                    Gait & Posture Assessment  Ambulation: Unassisted Gait: Relatively normal for age and body habitus Posture: WNL   Lower Extremity Exam    Side: Right lower extremity  Side: Left lower extremity  Skin & Extremity Inspection: Skin color, temperature, and hair growth are WNL. No peripheral edema or cyanosis. No masses, redness, swelling, asymmetry, or associated skin lesions. No contractures.  Skin & Extremity Inspection: Skin color, temperature, and hair growth are WNL. No peripheral edema or cyanosis. No masses, redness, swelling, asymmetry, or associated skin lesions. No contractures.  Functional ROM: Unrestricted ROM          Functional ROM: Unrestricted ROM          Muscle Tone/Strength: Functionally intact. No obvious neuro-muscular anomalies detected.  Muscle Tone/Strength: Functionally intact. No obvious neuro-muscular anomalies detected.  Sensory (Neurological): Unimpaired  Sensory (Neurological): Unimpaired  Palpation: No palpable anomalies  Palpation: No palpable anomalies   Assessment & Plan  Primary  Diagnosis & Pertinent Problem List: The primary encounter diagnosis was Chronic pain syndrome. Diagnoses of Chronic upper back pain (Primary Area of Pain) (midline), DDD (degenerative disc disease), thoracic, Thoracic facet arthropathy (Bilateral) (R>L), Thoracic facet syndrome (Bilateral) (L>R), Thoracic levoscoliosis, Thoracolumbar IVDD, Chronic neck pain (Secondary Area of Pain) (Bilateral)  (L>R), Cervical facet syndrome (Bilateral), DDD (degenerative disc disease), cervical, Grade 1 Anterolisthesis of C7 over T1, History of fusion of cervical spine (C5-6 ACDF), Osteoarthritis of cervical facet (Bilateral), Chronic upper extremity pain (Tertiary Area of Pain) (Bilateral) (L>R), Cervical foraminal stenosis (Bilateral), Cervical radiculitis (Bilateral), Chronic low back pain (Fourth Area of Pain) (Bilateral) (L>R), Lumbar facet syndrome (Bilateral) (L>R), Lumbar facet arthropathy (Bilateral), Chronic sacroiliac joint pain (Bilateral) (L>R), DDD (degenerative disc disease), lumbar, Lumbar Epidural lipomatosis, Osteoarthritis of lumbar facet joint (Bilateral), Chronic lower extremity pain (Fifth Area of Pain) (Bilateral) (L>R), Lumbar Grade 1 Retrolisthesis of L1-2 and L2-3, Lumbar foraminal stenosis (multilevel) (Bilateral), Chronic hip pain (Bilateral) (L>R), Fibromyalgia syndrome, Osteoarthritis, Disorder of skeletal system, Problems influencing health status, Pharmacologic therapy, Long term prescription opiate use, and Vitamin D deficiency were also pertinent to this visit.  Visit Diagnosis:  1. Chronic pain syndrome   2. Chronic upper back pain (Primary Area of Pain) (midline)   3. DDD (degenerative disc disease), thoracic   4. Thoracic facet arthropathy (Bilateral) (R>L)   5. Thoracic facet syndrome (Bilateral) (L>R)   6. Thoracic levoscoliosis   7. Thoracolumbar IVDD   8. Chronic neck pain (Secondary Area of Pain) (Bilateral)  (L>R)   9. Cervical facet syndrome (Bilateral)   10. DDD (degenerative  disc disease), cervical   11. Grade 1 Anterolisthesis of C7 over T1   12. History of fusion of cervical spine (C5-6 ACDF)   13. Osteoarthritis of cervical facet (Bilateral)   14. Chronic upper extremity pain (Tertiary Area of Pain) (Bilateral) (L>R)   15. Cervical foraminal stenosis (Bilateral)   16. Cervical radiculitis (Bilateral)   17. Chronic low back pain (Fourth Area of Pain) (Bilateral) (L>R)   18. Lumbar facet syndrome (Bilateral) (L>R)   19. Lumbar facet arthropathy (Bilateral)   20. Chronic sacroiliac joint pain (Bilateral) (L>R)   21. DDD (degenerative disc disease), lumbar   22. Lumbar Epidural lipomatosis   23. Osteoarthritis of lumbar facet joint (Bilateral)   24. Chronic lower extremity pain (Fifth Area of Pain) (Bilateral) (L>R)   25. Lumbar Grade 1 Retrolisthesis of L1-2 and L2-3   26. Lumbar foraminal stenosis (multilevel) (Bilateral)   27. Chronic hip pain (Bilateral) (L>R)   28. Fibromyalgia syndrome   29. Osteoarthritis   30. Disorder of skeletal system   31. Problems influencing health status   32. Pharmacologic therapy   33. Long term prescription opiate use   34. Vitamin D deficiency    Problems updated and reviewed during this visit: Problem  Chronic Pain Syndrome  Chronic upper extremity pain (Tertiary Area of Pain) (Bilateral) (L>R)  Fibromyalgia Syndrome  Osteoarthritis  Osteoarthritis of lumbar facet joint (Bilateral)  Lumbar facet arthropathy (Bilateral)  Lumbar facet syndrome (Bilateral) (L>R)  Lumbar foraminal stenosis (multilevel) (Bilateral)  Ddd (Degenerative Disc Disease), Lumbar  Thoracic levoscoliosis  Thoracic facet syndrome (Bilateral) (L>R)  Thoracic facet arthropathy (Bilateral) (R>L)  Ddd (Degenerative Disc Disease), Thoracic  Thoracolumbar IVDD  Ddd (Degenerative Disc Disease), Cervical  Osteoarthritis of cervical facet (Bilateral)  Grade 1 Anterolisthesis of C7 over T1  Cervical foraminal stenosis (Bilateral)  History of  fusion of cervical spine (C5-6 ACDF)  Cervical facet syndrome (Bilateral)  Cervical radiculitis (Bilateral)  Lumbar Epidural lipomatosis  Chronic sacroiliac joint pain (Bilateral) (L>R)  Chronic hip pain (Bilateral) (L>R)  Chronic upper back pain (Primary Area of Pain) (midline)  Chronic neck pain (Secondary Area of Pain) (Bilateral)  (L>R)  Chronic low back pain (Fourth Area of Pain) (Bilateral) (L>R)  Chronic lower extremity pain (Fifth Area of Pain) (Bilateral) (L>R)  Lumbar Grade 1 Retrolisthesis of L1-2 and L2-3  Bilateral Lower Extremity Edema   a. uses torsemide   Lumbar Disc Disease With Radiculopathy  Raynaud Disease  Raynaud's Syndrome   Qualifier: History of  By: Marcelino Scot CMA, Melburn Popper FASCIITIS   Qualifier: History of  By: Marcelino Scot CMA, Deborra Medina, Laurie     MIGRAINES, HX OF   Qualifier: Diagnosis of  By: Marcelino Scot CMA, [place] Bilberry     Disorder of Skeletal System  Other Long Term (Current) Drug Therapy  Other Specified Health Status  Long Term Current Use of Opiate Analgesic  Long Term Prescription Opiate Use  Opiate Use  Pharmacologic Therapy  Problems Influencing Health Status  Vitamin D Deficiency   Qualifier: Diagnosis of  By: [employer]  MD, Carmell Austria    Mitral Valve Disorder   Qualifier: History of  By: Marcelino Scot CMA, [place] Bilberry     DM Type 2 (Diabetes Mellitus, Type 2) ([facility])  Post-Menopausal  Interstitial lung disease ([place])  Htn (Hypertension)  Obesity  Palpitations   Qualifier: Diagnosis of  By: [name] MD, Tim     DEPRESSION   Qualifier: Diagnosis of  By: Glori Bickers MD, Carmell Austria    ANXIETY   Qualifier: Diagnosis of  By: Glori Bickers MD, Carmell Austria    OSTEOPENIA   Qualifier: Diagnosis of  By: Glori Bickers MD, Carmell Austria    Hypothyroidism   Qualifier: Diagnosis of  By: Marcelino Scot CMA, Auburn Bilberry     Mixed Hyperlipidemia   Qualifier: Diagnosis of  By: Marcelino Scot CMA, Auburn Bilberry     Other Iron Deficiency Anemias   Qualifier: Diagnosis  of  By: Marcelino Scot CMA, Ocie Doyne ATTACK   Qualifier: History of  By: Charlies Silvers     KERATOCONJUNCTIVITIS SICCA   Qualifier: History of  By: Charlies Silvers     ABNORMAL HEART RHYTHMS   Qualifier: History of  By: Charlies Silvers     GERD   Qualifier: Diagnosis of  By: Marcelino Scot CMA, Auburn Bilberry     IBS   Qualifier: History of  By: Marcelino Scot CMA, Auburn Bilberry     Rosacea   Qualifier: History of  By: Marcelino Scot CMA, Auburn Bilberry     Acute Renal Insufficiency (Resolved)  PERSONAL HISTORY ALLERGY UNSPEC MEDICINAL AGENT (Resolved)   Qualifier: Diagnosis of  By: Glori Bickers MD, Carmell Austria    Colon Cancer Screening (Resolved)  Cough (Resolved)  Rhinitis (Resolved)  Other Screening Mammogram (Resolved)  Routine General Medical Examination [place] (Resolved)  Hyperglycemia (Resolved)   Qualifier: Diagnosis of  By: Glori Bickers MD, Carmell [place]    GASTRITIS (Resolved)   Qualifier: History of  By: Marcelino Scot CMA, [place] Bilberry     Elevated Liver Enzymes (Resolved)  Drug Rash (Resolved)  ESOPHAGITIS (Resolved)   Qualifier: Hospitalized for  By: Marcelino Scot CMA, AAMA, [place] [facility]  Pharmacotherapy (Medications Ordered): No orders of the defined types were placed in this encounter.  Procedure Orders    No procedure(s) ordered today   Lab Orders  No laboratory test(s) ordered today   Imaging Orders  No imaging studies ordered today   Referral Orders  No referral(s) requested today    Pharmacological management options:  Opioid Analgesics: I will not be prescribing any opioids at this time.  The patient was informed that should she join our practice, the first thing we will be doing is tapering her opioids down until we can completely come off of them for a "Drug Holiday". Membrane stabilizer: None prescribed at this time Muscle relaxant: None prescribed at this time NSAID: None prescribed at this  time Other analgesic(s): None prescribed at this time   Interventional management options: Planned, scheduled, and/or pending:    None at this time   Considering:   Diagnostic bilateral thoracic facet block  Possible bilateral thoracic facet RFA  Diagnostic bilateral cervical facet block  Possible bilateral cervical facet RFA  Diagnostic bilateral occipital nerve blocks  Possible bilateral occipital nerve RFA  Possible bilateral occipital nerve peripheral nerve stimulator trial  Diagnostic left-sided cervical epidural steroid injection  Diagnostic bilateral lumbar facet block  Possible bilateral lumbar facet RFA  Diagnostic left-sided L4-5 interlaminar  lumbar epidural steroid injection  Diagnostic bilateral L4-5 and L5-S1 transforaminal epidural steroid injection  Diagnostic left SI joint injection    PRN Procedures:   None at this time   Provider-requested follow-up: Return if symptoms worsen or fail to improve.  The patient has indicated that she has to think about her options before committing.  Future Appointments  Date Time Provider [place]  08/14/2018 10:30 AM Eustace Pen, LPN [facility]  [id number]  9:30 AM Tower, [name], MD [facility]    Primary Care Physician: Tower, [name], MD Location: [facility] Note by: Gaspar Cola, MD Date: 11/09/2017; Time: 2:12 PM

## 2017-11-09 ENCOUNTER — Ambulatory Visit: Payer: Medicare Other | Attending: Pain Medicine | Admitting: Pain Medicine

## 2017-11-09 ENCOUNTER — Encounter: Payer: Self-pay | Admitting: Pain Medicine

## 2017-11-09 ENCOUNTER — Other Ambulatory Visit: Payer: Self-pay

## 2017-11-09 VITALS — BP 133/85 | HR 85 | Temp 98.5°F | Resp 16 | Ht 61.5 in | Wt 200.0 lb

## 2017-11-09 DIAGNOSIS — I10 Essential (primary) hypertension: Secondary | ICD-10-CM | POA: Diagnosis not present

## 2017-11-09 DIAGNOSIS — F419 Anxiety disorder, unspecified: Secondary | ICD-10-CM | POA: Insufficient documentation

## 2017-11-09 DIAGNOSIS — M79604 Pain in right leg: Secondary | ICD-10-CM

## 2017-11-09 DIAGNOSIS — Z7982 Long term (current) use of aspirin: Secondary | ICD-10-CM | POA: Insufficient documentation

## 2017-11-09 DIAGNOSIS — M47814 Spondylosis without myelopathy or radiculopathy, thoracic region: Secondary | ICD-10-CM | POA: Diagnosis not present

## 2017-11-09 DIAGNOSIS — M4802 Spinal stenosis, cervical region: Secondary | ICD-10-CM

## 2017-11-09 DIAGNOSIS — Z981 Arthrodesis status: Secondary | ICD-10-CM

## 2017-11-09 DIAGNOSIS — M5412 Radiculopathy, cervical region: Secondary | ICD-10-CM

## 2017-11-09 DIAGNOSIS — L719 Rosacea, unspecified: Secondary | ICD-10-CM | POA: Insufficient documentation

## 2017-11-09 DIAGNOSIS — J849 Interstitial pulmonary disease, unspecified: Secondary | ICD-10-CM | POA: Diagnosis not present

## 2017-11-09 DIAGNOSIS — M8580 Other specified disorders of bone density and structure, unspecified site: Secondary | ICD-10-CM | POA: Insufficient documentation

## 2017-11-09 DIAGNOSIS — Z79891 Long term (current) use of opiate analgesic: Secondary | ICD-10-CM

## 2017-11-09 DIAGNOSIS — M899 Disorder of bone, unspecified: Secondary | ICD-10-CM | POA: Insufficient documentation

## 2017-11-09 DIAGNOSIS — M519 Unspecified thoracic, thoracolumbar and lumbosacral intervertebral disc disorder: Secondary | ICD-10-CM

## 2017-11-09 DIAGNOSIS — M5442 Lumbago with sciatica, left side: Secondary | ICD-10-CM

## 2017-11-09 DIAGNOSIS — M79605 Pain in left leg: Secondary | ICD-10-CM

## 2017-11-09 DIAGNOSIS — M47816 Spondylosis without myelopathy or radiculopathy, lumbar region: Secondary | ICD-10-CM

## 2017-11-09 DIAGNOSIS — E559 Vitamin D deficiency, unspecified: Secondary | ICD-10-CM

## 2017-11-09 DIAGNOSIS — M4310 Spondylolisthesis, site unspecified: Secondary | ICD-10-CM

## 2017-11-09 DIAGNOSIS — M48061 Spinal stenosis, lumbar region without neurogenic claudication: Secondary | ICD-10-CM

## 2017-11-09 DIAGNOSIS — G8929 Other chronic pain: Secondary | ICD-10-CM | POA: Insufficient documentation

## 2017-11-09 DIAGNOSIS — M5441 Lumbago with sciatica, right side: Secondary | ICD-10-CM

## 2017-11-09 DIAGNOSIS — M5124 Other intervertebral disc displacement, thoracic region: Secondary | ICD-10-CM | POA: Insufficient documentation

## 2017-11-09 DIAGNOSIS — Z780 Asymptomatic menopausal state: Secondary | ICD-10-CM | POA: Diagnosis not present

## 2017-11-09 DIAGNOSIS — I7300 Raynaud's syndrome without gangrene: Secondary | ICD-10-CM | POA: Insufficient documentation

## 2017-11-09 DIAGNOSIS — M533 Sacrococcygeal disorders, not elsewhere classified: Secondary | ICD-10-CM

## 2017-11-09 DIAGNOSIS — M4124 Other idiopathic scoliosis, thoracic region: Secondary | ICD-10-CM | POA: Diagnosis not present

## 2017-11-09 DIAGNOSIS — Z880 Allergy status to penicillin: Secondary | ICD-10-CM | POA: Insufficient documentation

## 2017-11-09 DIAGNOSIS — Z79899 Other long term (current) drug therapy: Secondary | ICD-10-CM | POA: Diagnosis not present

## 2017-11-09 DIAGNOSIS — M51369 Other intervertebral disc degeneration, lumbar region without mention of lumbar back pain or lower extremity pain: Secondary | ICD-10-CM

## 2017-11-09 DIAGNOSIS — Z7984 Long term (current) use of oral hypoglycemic drugs: Secondary | ICD-10-CM | POA: Insufficient documentation

## 2017-11-09 DIAGNOSIS — M5030 Other cervical disc degeneration, unspecified cervical region: Secondary | ICD-10-CM

## 2017-11-09 DIAGNOSIS — N83202 Unspecified ovarian cyst, left side: Secondary | ICD-10-CM | POA: Insufficient documentation

## 2017-11-09 DIAGNOSIS — M25551 Pain in right hip: Secondary | ICD-10-CM

## 2017-11-09 DIAGNOSIS — M9983 Other biomechanical lesions of lumbar region: Secondary | ICD-10-CM

## 2017-11-09 DIAGNOSIS — D1779 Benign lipomatous neoplasm of other sites: Secondary | ICD-10-CM

## 2017-11-09 DIAGNOSIS — E669 Obesity, unspecified: Secondary | ICD-10-CM | POA: Insufficient documentation

## 2017-11-09 DIAGNOSIS — Z789 Other specified health status: Secondary | ICD-10-CM

## 2017-11-09 DIAGNOSIS — Z9889 Other specified postprocedural states: Secondary | ICD-10-CM | POA: Insufficient documentation

## 2017-11-09 DIAGNOSIS — M797 Fibromyalgia: Secondary | ICD-10-CM | POA: Insufficient documentation

## 2017-11-09 DIAGNOSIS — M5134 Other intervertebral disc degeneration, thoracic region: Secondary | ICD-10-CM | POA: Diagnosis not present

## 2017-11-09 DIAGNOSIS — M549 Dorsalgia, unspecified: Secondary | ICD-10-CM | POA: Diagnosis not present

## 2017-11-09 DIAGNOSIS — M722 Plantar fascial fibromatosis: Secondary | ICD-10-CM | POA: Diagnosis not present

## 2017-11-09 DIAGNOSIS — F329 Major depressive disorder, single episode, unspecified: Secondary | ICD-10-CM | POA: Diagnosis not present

## 2017-11-09 DIAGNOSIS — D509 Iron deficiency anemia, unspecified: Secondary | ICD-10-CM | POA: Insufficient documentation

## 2017-11-09 DIAGNOSIS — Z87891 Personal history of nicotine dependence: Secondary | ICD-10-CM | POA: Insufficient documentation

## 2017-11-09 DIAGNOSIS — Z881 Allergy status to other antibiotic agents status: Secondary | ICD-10-CM | POA: Insufficient documentation

## 2017-11-09 DIAGNOSIS — M542 Cervicalgia: Secondary | ICD-10-CM

## 2017-11-09 DIAGNOSIS — M150 Primary generalized (osteo)arthritis: Secondary | ICD-10-CM

## 2017-11-09 DIAGNOSIS — M4184 Other forms of scoliosis, thoracic region: Secondary | ICD-10-CM | POA: Diagnosis not present

## 2017-11-09 DIAGNOSIS — M47812 Spondylosis without myelopathy or radiculopathy, cervical region: Secondary | ICD-10-CM | POA: Diagnosis not present

## 2017-11-09 DIAGNOSIS — M79601 Pain in right arm: Secondary | ICD-10-CM

## 2017-11-09 DIAGNOSIS — M4694 Unspecified inflammatory spondylopathy, thoracic region: Secondary | ICD-10-CM

## 2017-11-09 DIAGNOSIS — R748 Abnormal levels of other serum enzymes: Secondary | ICD-10-CM | POA: Insufficient documentation

## 2017-11-09 DIAGNOSIS — M5116 Intervertebral disc disorders with radiculopathy, lumbar region: Secondary | ICD-10-CM | POA: Diagnosis not present

## 2017-11-09 DIAGNOSIS — G894 Chronic pain syndrome: Secondary | ICD-10-CM

## 2017-11-09 DIAGNOSIS — K210 Gastro-esophageal reflux disease with esophagitis: Secondary | ICD-10-CM | POA: Insufficient documentation

## 2017-11-09 DIAGNOSIS — E1165 Type 2 diabetes mellitus with hyperglycemia: Secondary | ICD-10-CM | POA: Diagnosis not present

## 2017-11-09 DIAGNOSIS — M25552 Pain in left hip: Secondary | ICD-10-CM

## 2017-11-09 DIAGNOSIS — R002 Palpitations: Secondary | ICD-10-CM | POA: Diagnosis not present

## 2017-11-09 DIAGNOSIS — M5010 Cervical disc disorder with radiculopathy, unspecified cervical region: Secondary | ICD-10-CM | POA: Diagnosis not present

## 2017-11-09 DIAGNOSIS — M159 Polyosteoarthritis, unspecified: Secondary | ICD-10-CM

## 2017-11-09 DIAGNOSIS — Z832 Family history of diseases of the blood and blood-forming organs and certain disorders involving the immune mechanism: Secondary | ICD-10-CM | POA: Insufficient documentation

## 2017-11-09 DIAGNOSIS — M79602 Pain in left arm: Secondary | ICD-10-CM

## 2017-11-09 DIAGNOSIS — Z8249 Family history of ischemic heart disease and other diseases of the circulatory system: Secondary | ICD-10-CM | POA: Insufficient documentation

## 2017-11-09 DIAGNOSIS — E882 Lipomatosis, not elsewhere classified: Secondary | ICD-10-CM

## 2017-11-09 DIAGNOSIS — Z833 Family history of diabetes mellitus: Secondary | ICD-10-CM | POA: Insufficient documentation

## 2017-11-09 DIAGNOSIS — Z807 Family history of other malignant neoplasms of lymphoid, hematopoietic and related tissues: Secondary | ICD-10-CM | POA: Insufficient documentation

## 2017-11-09 DIAGNOSIS — M5136 Other intervertebral disc degeneration, lumbar region: Secondary | ICD-10-CM

## 2017-11-09 DIAGNOSIS — K589 Irritable bowel syndrome without diarrhea: Secondary | ICD-10-CM | POA: Insufficient documentation

## 2017-11-09 DIAGNOSIS — E782 Mixed hyperlipidemia: Secondary | ICD-10-CM | POA: Insufficient documentation

## 2017-11-09 DIAGNOSIS — Z801 Family history of malignant neoplasm of trachea, bronchus and lung: Secondary | ICD-10-CM | POA: Insufficient documentation

## 2017-11-09 DIAGNOSIS — M47894 Other spondylosis, thoracic region: Secondary | ICD-10-CM

## 2017-11-09 DIAGNOSIS — E039 Hypothyroidism, unspecified: Secondary | ICD-10-CM | POA: Insufficient documentation

## 2017-11-09 DIAGNOSIS — M9981 Other biomechanical lesions of cervical region: Secondary | ICD-10-CM

## 2017-11-09 NOTE — Progress Notes (Signed)
Safety precautions to be maintained throughout the outpatient stay will include: orient to surroundings, keep bed in low position, maintain call bell within reach at all times, provide assistance with transfer out of bed and ambulation.  

## 2017-11-09 NOTE — Patient Instructions (Addendum)
____________________________________________________________________________________________  Pain Scale  Introduction: The pain score used by this practice is the Verbal Numerical Rating Scale (VNRS-11). This is an 11-point scale. It is for adults and children 10 years or older. There are significant differences in how the pain score is reported, used, and applied. Forget everything you learned in the past and learn this scoring system.  General Information: The scale should reflect your current level of pain. Unless you are specifically asked for the level of your worst pain, or your average pain. If you are asked for one of these two, then it should be understood that it is over the past 24 hours.  Basic Activities of Daily Living (ADL): Personal hygiene, dressing, eating, transferring, and using restroom.  Instructions: Most patients tend to report their level of pain as a combination of two factors, their physical pain and their psychosocial pain. This last one is also known as "suffering" and it is reflection of how physical pain affects you socially and psychologically. From now on, report them separately. From this point on, when asked to report your pain level, report only your physical pain. Use the following table for reference.  Pain Clinic Pain Levels (0-5/10)  Pain Level Score  Description  No Pain 0   Mild pain 1 Nagging, annoying, but does not interfere with basic activities of daily living (ADL). Patients are able to eat, bathe, get dressed, toileting (being able to get on and off the toilet and perform personal hygiene functions), transfer (move in and out of bed or a chair without assistance), and maintain continence (able to control bladder and bowel functions). Blood pressure and heart rate are unaffected. A normal heart rate for a healthy adult ranges from 60 to 100 bpm (beats per minute).   Mild to moderate pain 2 Noticeable and distracting. Impossible to hide from other  people. More frequent flare-ups. Still possible to adapt and function close to normal. It can be very annoying and may have occasional stronger flare-ups. With discipline, patients may get used to it and adapt.   Moderate pain 3 Interferes significantly with activities of daily living (ADL). It becomes difficult to feed, bathe, get dressed, get on and off the toilet or to perform personal hygiene functions. Difficult to get in and out of bed or a chair without assistance. Very distracting. With effort, it can be ignored when deeply involved in activities.   Moderately severe pain 4 Impossible to ignore for more than a few minutes. With effort, patients may still be able to manage work or participate in some social activities. Very difficult to concentrate. Signs of autonomic nervous system discharge are evident: dilated pupils (mydriasis); mild sweating (diaphoresis); sleep interference. Heart rate becomes elevated (>115 bpm). Diastolic blood pressure (lower number) rises above 100 mmHg. Patients find relief in laying down and not moving.   Severe pain 5 Intense and extremely unpleasant. Associated with frowning face and frequent crying. Pain overwhelms the senses.  Ability to do any activity or maintain social relationships becomes significantly limited. Conversation becomes difficult. Pacing back and forth is common, as getting into a comfortable position is nearly impossible. Pain wakes you up from deep sleep. Physical signs will be obvious: pupillary dilation; increased sweating; goosebumps; brisk reflexes; cold, clammy hands and feet; nausea, vomiting or dry heaves; loss of appetite; significant sleep disturbance with inability to fall asleep or to remain asleep. When persistent, significant weight loss is observed due to the complete loss of appetite and sleep deprivation.  Blood   pressure and heart rate becomes significantly elevated. Caution: If elevated blood pressure triggers a pounding headache  associated with blurred vision, then the patient should immediately seek attention at an urgent or emergency care unit, as these may be signs of an impending stroke.    Emergency Department Pain Levels (6-10/10)  Emergency Room Pain 6 Severely limiting. Requires emergency care and should not be seen or managed at an outpatient pain management facility. Communication becomes difficult and requires great effort. Assistance to reach the emergency department may be required. Facial flushing and profuse sweating along with potentially dangerous increases in heart rate and blood pressure will be evident.   Distressing pain 7 Self-care is very difficult. Assistance is required to transport, or use restroom. Assistance to reach the emergency department will be required. Tasks requiring coordination, such as bathing and getting dressed become very difficult.   Disabling pain 8 Self-care is no longer possible. At this level, pain is disabling. The individual is unable to do even the most "basic" activities such as walking, eating, bathing, dressing, transferring to a bed, or toileting. Fine motor skills are lost. It is difficult to think clearly.   Incapacitating pain 9 Pain becomes incapacitating. Thought processing is no longer possible. Difficult to remember your own name. Control of movement and coordination are lost.   The worst pain imaginable 10 At this level, most patients pass out from pain. When this level is reached, collapse of the autonomic nervous system occurs, leading to a sudden drop in blood pressure and heart rate. This in turn results in a temporary and dramatic drop in blood flow to the brain, leading to a loss of consciousness. Fainting is one of the body's self defense mechanisms. Passing out puts the brain in a calmed state and causes it to shut down for a while, in order to begin the healing process.    Summary: 1. Refer to this scale when providing [place] with your pain level. 2. Be  accurate and careful when reporting your pain level. This will help with your care. 3. Over-reporting your pain level will lead to loss of credibility. 4. Even a level of 1/10 means that there is pain and will be treated at our facility. 5. High, inaccurate reporting will be documented as "Symptom Exaggeration", leading to loss of credibility and suspicions of possible secondary gains such as obtaining more narcotics, or wanting to appear disabled, for fraudulent reasons. 6. Only pain levels of 5 or below will be seen at our facility. 7. Pain levels of 6 and above will be sent to the Emergency Department and the appointment cancelled. ____________________________________________________________________________________________   ____________________________________________________________________________________________  Preparing for Procedure with Sedation Instructions: . Oral Intake: Do not eat or drink anything for at least 8 hours prior to your procedure. . Transportation: Public transportation is not allowed. Bring an adult driver. The driver must be physically present in our waiting room before any procedure can be started. Marland Kitchen Physical Assistance: Bring an adult physically capable of assisting you, in the event you need help. This adult should keep you company at home for at least 6 hours after the procedure. . Blood Pressure Medicine: Take your blood pressure medicine with a sip of water the morning of the procedure. . Blood thinners:  . Diabetics on insulin: Notify the staff so that you can be scheduled 1st case in the morning. If your diabetes requires high dose insulin, take only  of your normal insulin dose the morning of the procedure and notify  the staff that you have done so. . Preventing infections: Shower with an antibacterial soap the morning of your procedure. . Build-up your immune system: Take 1000 mg of Vitamin C with every meal (3 times a day) the day prior to your  procedure. Marland Kitchen Antibiotics: Inform the staff if you have a condition or reason that requires you to take antibiotics before dental procedures. . Pregnancy: If you are pregnant, call and cancel the procedure. . Sickness: If you have a cold, fever, or any active infections, call and cancel the procedure. . Arrival: You must be in the facility at least 30 minutes prior to your scheduled procedure. . Children: Do not bring children with you. . Dress appropriately: Bring dark clothing that you would not mind if they get stained. . Valuables: Do not bring any jewelry or valuables. Procedure appointments are reserved for interventional treatments only. Marland Kitchen No Prescription Refills. . No medication changes will be discussed during procedure appointments. . No disability issues will be discussed. ____________________________________________________________________________________________   ____________________________________________________________________________________________  DRUG HOLIDAYS  What is a "Drug Holiday"? Drug Holiday: is the name given to the period of time during which a patient stops taking a medication(s) for the purpose of eliminating tolerance to the drug.  Benefits . Improved effectiveness of opioids. . Decreased opioid dose needed to achieve benefits. . Improved pain with lesser dose.  What is tolerance? Tolerance: is the progressive decreased in effectiveness of a drug due to its repetitive use. With repetitive use, the body gets use to the medication and as a consequence, it loses its effectiveness. This is a common problem seen with opioid pain medications. As a result, a larger dose of the drug is needed to achieve the same effect that used to be obtained with a smaller dose.  How long should a "Drug Holiday" last? At least 14 consecutive days. (2 weeks)  What are withdrawals? Withdrawals: refers to the wide range of symptoms that occur after stopping or dramatically  reducing opiate drugs after heavy and prolonged use. Withdrawal symptoms do not occur to patients that use low dose opioids, or those who take the medication sporadically. Contrary to benzodiazepine (example: Valium, Xanax, etc.) or alcohol withdrawals ("Delirium Tremens"), opioid withdrawals are not lethal. Withdrawals are the physical manifestation of the body getting rid of the excess receptors.  Expected Symptoms Early symptoms of withdrawal may include: . Agitation . Anxiety . Muscle aches . Increased tearing . Insomnia . Runny nose . Sweating . Yawning  Late symptoms of withdrawal may include: . Abdominal cramping . Diarrhea . Dilated pupils . Goose bumps . Nausea . Vomiting  Will I experience withdrawals? Due to the slow nature of the taper, it is very unlikely that you will experience any.  What is a slow taper? Taper: refers to the gradual decrease in dose. ___________________________________________________________________________________________

## 2017-11-24 ENCOUNTER — Other Ambulatory Visit: Payer: Self-pay | Admitting: *Deleted

## 2017-11-24 MED ORDER — HYOSCYAMINE SULFATE 0.125 MG SL SUBL: 0.1250 mg | SUBLINGUAL_TABLET | SUBLINGUAL | 1 refills | Status: DC | PRN

## 2017-11-24 MED ORDER — CYCLOBENZAPRINE HCL 10 MG PO TABS: ORAL_TABLET | ORAL | 1 refills | Status: DC

## 2017-11-24 NOTE — Telephone Encounter (Signed)
CPE scheduled 08/21/18. Received a fax requesting Rx for flexeril be sent to [facility] and hyoscoyamine be sent to [facility] [address] please advise

## 2017-11-24 NOTE — Telephone Encounter (Signed)
Will refill electronically  

## 2017-12-08 DIAGNOSIS — Z79891 Long term (current) use of opiate analgesic: Secondary | ICD-10-CM | POA: Diagnosis not present

## 2017-12-08 DIAGNOSIS — G894 Chronic pain syndrome: Secondary | ICD-10-CM | POA: Diagnosis not present

## 2017-12-08 DIAGNOSIS — M5137 Other intervertebral disc degeneration, lumbosacral region: Secondary | ICD-10-CM | POA: Diagnosis not present

## 2017-12-08 DIAGNOSIS — M961 Postlaminectomy syndrome, not elsewhere classified: Secondary | ICD-10-CM | POA: Diagnosis not present

## 2017-12-08 DIAGNOSIS — M4725 Other spondylosis with radiculopathy, thoracolumbar region: Secondary | ICD-10-CM | POA: Diagnosis not present

## 2018-01-12 ENCOUNTER — Other Ambulatory Visit: Payer: Self-pay | Admitting: Cardiovascular Disease

## 2018-02-23 DIAGNOSIS — M5137 Other intervertebral disc degeneration, lumbosacral region: Secondary | ICD-10-CM | POA: Diagnosis not present

## 2018-02-23 DIAGNOSIS — M4725 Other spondylosis with radiculopathy, thoracolumbar region: Secondary | ICD-10-CM | POA: Diagnosis not present

## 2018-02-23 DIAGNOSIS — Z79891 Long term (current) use of opiate analgesic: Secondary | ICD-10-CM | POA: Diagnosis not present

## 2018-02-23 DIAGNOSIS — M961 Postlaminectomy syndrome, not elsewhere classified: Secondary | ICD-10-CM | POA: Diagnosis not present

## 2018-02-23 DIAGNOSIS — G894 Chronic pain syndrome: Secondary | ICD-10-CM | POA: Diagnosis not present

## 2018-03-30 ENCOUNTER — Other Ambulatory Visit: Payer: Self-pay | Admitting: Family Medicine

## 2018-03-30 DIAGNOSIS — Z1231 Encounter for screening mammogram for malignant neoplasm of breast: Secondary | ICD-10-CM

## 2018-04-04 ENCOUNTER — Ambulatory Visit
Admission: RE | Admit: 2018-04-04 | Discharge: 2018-04-04 | Disposition: A | Discharge status: Home / Self Care | Payer: Medicare Other | Source: Ambulatory Visit | Attending: Family Medicine | Admitting: Family Medicine

## 2018-04-04 DIAGNOSIS — Z1231 Encounter for screening mammogram for malignant neoplasm of breast: Secondary | ICD-10-CM | POA: Diagnosis not present

## 2018-04-11 ENCOUNTER — Telehealth: Payer: Self-pay

## 2018-04-11 ENCOUNTER — Other Ambulatory Visit: Payer: Self-pay | Admitting: Cardiovascular Disease

## 2018-04-11 NOTE — Telephone Encounter (Signed)
-----   Message from Anselm Pancoast, [place] sent at 04/11/2018 10:18 AM EDT ----- Please contact patient for a follow up appointment with Dr. [name]. The patient was to be seen in June.  Thanks, Ivin Booty

## 2018-04-11 NOTE — Telephone Encounter (Signed)
l mom to schedule 12 month f/u appt

## 2018-04-19 DIAGNOSIS — H169 Unspecified keratitis: Secondary | ICD-10-CM | POA: Diagnosis not present

## 2018-04-21 DIAGNOSIS — H169 Unspecified keratitis: Secondary | ICD-10-CM | POA: Diagnosis not present

## 2018-04-25 NOTE — Telephone Encounter (Signed)
Attempted to call patient  Unable to lmov to schedule appointment Will try again at a later time

## 2018-04-28 NOTE — Telephone Encounter (Signed)
Attempted to call patient  Unable to lmov to schedule appointment Will try again at a later time

## 2018-05-01 NOTE — Telephone Encounter (Signed)
Pt scheduled for 05/11/18 with Dr [name]

## 2018-05-02 DIAGNOSIS — M961 Postlaminectomy syndrome, not elsewhere classified: Secondary | ICD-10-CM | POA: Diagnosis not present

## 2018-05-02 DIAGNOSIS — Z79891 Long term (current) use of opiate analgesic: Secondary | ICD-10-CM | POA: Diagnosis not present

## 2018-05-02 DIAGNOSIS — M5137 Other intervertebral disc degeneration, lumbosacral region: Secondary | ICD-10-CM | POA: Diagnosis not present

## 2018-05-02 DIAGNOSIS — G894 Chronic pain syndrome: Secondary | ICD-10-CM | POA: Diagnosis not present

## 2018-05-02 DIAGNOSIS — M4725 Other spondylosis with radiculopathy, thoracolumbar region: Secondary | ICD-10-CM | POA: Diagnosis not present

## 2018-05-05 ENCOUNTER — Encounter: Payer: Self-pay | Admitting: Family Medicine

## 2018-05-05 ENCOUNTER — Ambulatory Visit (INDEPENDENT_AMBULATORY_CARE_PROVIDER_SITE_OTHER): Payer: Medicare Other | Admitting: Family Medicine

## 2018-05-05 DIAGNOSIS — S0300XA Dislocation of jaw, unspecified side, initial encounter: Secondary | ICD-10-CM | POA: Diagnosis not present

## 2018-05-05 DIAGNOSIS — M542 Cervicalgia: Secondary | ICD-10-CM

## 2018-05-05 MED ORDER — PREDNISONE 20 MG PO TABS: ORAL_TABLET | ORAL | 0 refills | Status: DC

## 2018-05-05 NOTE — Progress Notes (Signed)
Subjective:    Patient ID: Tina Berger, female    DOB: Mar 17, 1951, 67 y.o.   MRN: [id number]  HPI    67 year old female patient of Dr. Marliss Coots with history of hypothyroid, HTN, cervical radiculopathy hx of laminectomy re,motely and former smoker presents with new onset pain in neck and jaw x 5 days. Reports pain is around entire neck .  Awoke with the pain.  She does grind her teeth at night. Noted sores in side of tounge.. Gets these regularly.  She feels like in last few days she feels swollen in neck and cheeks, pain with trying to open mouth. In anterior jaw in front of ears. New meds: Steroid and antibiotics drops for 14 days.. No longer on. She tried CBD oil.. 2 weeks ago for 2 days.  No falls, no known injury.  Hx of chronic pain... Goes to pain clinic and is on hydromorphone. Has been  Trying to wean off. Saw Pain MD 3 days ago and  Increased dose back up.   She has been using ibuprofen.  Blood pressure 128/78, pulse 86, temperature 98.6 F (37 C), temperature source Oral, weight 201 lb (91.2 kg), SpO2 95 %. Social History /Family History/Past Medical History reviewed in detail and updated in EMR if needed.  Review of Systems  Constitutional: Negative for fatigue and fever.  HENT: Negative for ear pain and sore throat.        Post nasal drip  Eyes: Negative for pain.  Respiratory: Negative for cough, chest tightness and shortness of breath.   Cardiovascular: Negative for chest pain, palpitations and leg swelling.  Gastrointestinal: Negative for abdominal pain.  Genitourinary: Negative for dysuria.  Neurological: Positive for headaches.       Objective:   Physical Exam  Constitutional: Vital signs are normal. She appears well-developed and well-nourished. She is cooperative.  Non-toxic appearance. She does not appear ill. No distress.  HENT:  Head: Normocephalic.  Right Ear: Hearing, tympanic membrane, external ear and ear canal normal. Tympanic membrane is not  erythematous, not retracted and not bulging.  Left Ear: Hearing, tympanic membrane, external ear and ear canal normal. Tympanic membrane is not erythematous, not retracted and not bulging.  Nose: No mucosal edema or rhinorrhea. Right sinus exhibits no maxillary sinus tenderness and no frontal sinus tenderness. Left sinus exhibits no maxillary sinus tenderness and no frontal sinus tenderness.  Mouth/Throat: Uvula is midline, oropharynx is clear and moist and mucous membranes are normal. No posterior oropharyngeal edema or posterior oropharyngeal erythema.   PAin over TMJ and pain with opening mouth primarily over TMJ   ulcers on sides of toungue, chronic  Eyes: Pupils are equal, round, and reactive to light. Conjunctivae, EOM and lids are normal. Lids are everted and swept, no foreign bodies found.  Neck: Trachea normal. Neck supple. Carotid bruit is not present. No neck rigidity. Decreased range of motion present. No edema and no erythema present. No thyroid mass and no thyromegaly present.  Cardiovascular: Normal rate, regular rhythm, S1 normal, S2 normal, normal heart sounds, intact distal pulses and normal pulses. Exam reveals no gallop and no friction rub.  No murmur heard. Pulmonary/Chest: Effort normal and breath sounds normal. No tachypnea. No respiratory distress. She has no decreased breath sounds. She has no wheezes. She has no rhonchi. She has no rales.  Abdominal: Soft. Normal appearance and bowel sounds are normal. There is no tenderness.  Neurological: She is alert.  Skin: Skin is warm, dry and intact.  No rash noted.  Psychiatric: Her speech is normal and behavior is normal. Judgment and thought content normal. Her mood appears not anxious. Cognition and memory are normal. She does not exhibit a depressed mood.          Assessment & Plan:

## 2018-05-05 NOTE — Assessment & Plan Note (Signed)
Mouthguard., Pred taper and limit chewy foods.

## 2018-05-05 NOTE — Patient Instructions (Addendum)
Restart wearing mouth guard. Complete course of prednisone.  Limit chewy foods, gum, steaks taffy.  Follow up if not improving as expected.  If any shortness of breath.. Go to ER.  Temporomandibular Joint Syndrome Temporomandibular joint (TMJ) syndrome is a condition that affects the joints between your jaw and your skull. The TMJs are located near your ears and allow your jaw to open and close. These joints and the nearby muscles are involved in all movements of the jaw. People with TMJ syndrome have pain in the area of these joints and muscles. Chewing, biting, or other movements of the jaw can be difficult or painful. TMJ syndrome can be caused by various things. In many cases, the condition is mild and goes away within a few weeks. For some people, the condition can become a long-term problem. What are the causes? Possible causes of TMJ syndrome include:  Grinding your teeth or clenching your jaw. Some people do this when they are under stress.  Arthritis.  Injury to the jaw.  Head or neck injury.  Teeth or dentures that are not aligned well.  In some cases, the cause of TMJ syndrome may not be known. What are the signs or symptoms? The most common symptom is an aching pain on the side of the head in the area of the TMJ. Other symptoms may include:  Pain when moving your jaw, such as when chewing or biting.  Being unable to open your jaw all the way.  Making a clicking sound when you open your mouth.  Headache.  Earache.  Neck or shoulder pain.  How is this diagnosed? Diagnosis can usually be made based on your symptoms, your medical history, and a physical exam. Your health care provider may check the range of motion of your jaw. Imaging tests, such as X-rays or an MRI, are sometimes done. You may need to see your dentist to determine if your teeth and jaw are lined up correctly. How is this treated? TMJ syndrome often goes away on its own. If treatment is needed, the  options may include:  Eating soft foods and applying ice or heat.  Medicines to relieve pain or inflammation.  Medicines to relax the muscles.  A splint, bite plate, or mouthpiece to prevent teeth grinding or jaw clenching.  Relaxation techniques or counseling to help reduce stress.  Transcutaneous electrical nerve stimulation (TENS). This helps to relieve pain by applying an electrical current through the skin.  Acupuncture. This is sometimes helpful to relieve pain.  Jaw surgery. This is rarely needed.  Follow these instructions at home:  Take medicines only as directed by your health care provider.  Eat a soft diet if you are having trouble chewing.  Apply ice to the painful area. ? Put ice in a plastic bag. ? Place a towel between your skin and the bag. ? Leave the ice on for 20 minutes, 2-3 times a day.  Apply a warm compress to the painful area as directed.  Massage your jaw area and perform any jaw stretching exercises as recommended by your health care provider.  If you were given a mouthpiece or bite plate, wear it as directed.  Avoid foods that require a lot of chewing. Do not chew gum.  Keep all follow-up visits as directed by your health care provider. This is important. Contact a health care provider if:  You are having trouble eating.  You have new or worsening symptoms. Get help right away if:  Your jaw locks  open or closed. This information is not intended to replace advice given to you by your health care provider. Make sure you discuss any questions you have with your health care provider. Document Released: 06/29/2001 Document Revised: 06/03/2016 Document Reviewed: 05/09/2014 Elsevier Interactive Patient Education  [employer].

## 2018-05-05 NOTE — Assessment & Plan Note (Signed)
No thyroid swelling, no parotid swelling , no lymphadenopathy.  Pain most likely referred from jaw.  No clear radiation from posterior neck.

## 2018-05-11 ENCOUNTER — Ambulatory Visit: Payer: Medicare Other | Admitting: Cardiovascular Disease

## 2018-05-11 ENCOUNTER — Encounter: Payer: Self-pay | Admitting: Family Medicine

## 2018-05-31 ENCOUNTER — Encounter: Payer: Self-pay | Admitting: Family Medicine

## 2018-06-01 DIAGNOSIS — L738 Other specified follicular disorders: Secondary | ICD-10-CM | POA: Diagnosis not present

## 2018-06-01 DIAGNOSIS — L739 Follicular disorder, unspecified: Secondary | ICD-10-CM | POA: Diagnosis not present

## 2018-06-05 ENCOUNTER — Other Ambulatory Visit: Payer: Self-pay

## 2018-06-05 NOTE — Patient Outreach (Signed)
[facility]) Care Management  06/05/2018  Tina Berger 09-16-1951 [id number]   Medication Adherence call to Mrs. Tina Berger patient is showing due on Metformin 500 mg and Ramipril 5 mg spoke with patient she will order all her medication herself. Tina Berger is showing past due under [place].   [facility] Direct Dial [phone]  Fax [phone] [email or web address]

## 2018-06-09 DIAGNOSIS — L738 Other specified follicular disorders: Secondary | ICD-10-CM | POA: Diagnosis not present

## 2018-06-09 DIAGNOSIS — E119 Type 2 diabetes mellitus without complications: Secondary | ICD-10-CM | POA: Diagnosis not present

## 2018-06-20 ENCOUNTER — Encounter: Payer: Self-pay | Admitting: Cardiovascular Disease

## 2018-06-20 ENCOUNTER — Ambulatory Visit: Payer: Medicare Other | Admitting: Cardiovascular Disease

## 2018-06-20 VITALS — BP 108/70 | HR 83 | Ht 61.5 in | Wt 196.2 lb

## 2018-06-20 DIAGNOSIS — I059 Rheumatic mitral valve disease, unspecified: Secondary | ICD-10-CM

## 2018-06-20 DIAGNOSIS — E119 Type 2 diabetes mellitus without complications: Secondary | ICD-10-CM | POA: Diagnosis not present

## 2018-06-20 DIAGNOSIS — E782 Mixed hyperlipidemia: Secondary | ICD-10-CM

## 2018-06-20 DIAGNOSIS — I7300 Raynaud's syndrome without gangrene: Secondary | ICD-10-CM

## 2018-06-20 DIAGNOSIS — R600 Localized edema: Secondary | ICD-10-CM

## 2018-06-20 MED ORDER — DILTIAZEM HCL 30 MG PO TABS: 30.0000 mg | ORAL_TABLET | Freq: Three times a day (TID) | ORAL | 3 refills | Status: DC | PRN

## 2018-06-20 MED ORDER — METOPROLOL SUCCINATE ER 50 MG PO TB24: 50.0000 mg | ORAL_TABLET | Freq: Two times a day (BID) | ORAL | 3 refills | Status: DC

## 2018-06-20 NOTE — Patient Instructions (Signed)
Medication Instructions:   No medication changes made  Diltiazem 30 mg pills as needed  Labwork:  No new labs needed  Testing/Procedures:  No further testing at this time   Follow-Up: It was a pleasure seeing you in the office today. Please call us if you have new issues that need to be addressed before your next appt.  [phone]  Your physician wants you to follow-up in:  as needed  If you need a refill on your cardiac medications before your next appointment, please call your pharmacy.  For educational health videos Log in to : [email or web address] Or : [email or web address], password : [email or web address]

## 2018-06-20 NOTE — Progress Notes (Signed)
Cardiology Office Note  Date:  06/20/2018   ID:  Tina Berger, DOB [birth year], MRN [id number]  PCP:  Abner Greenspan, MD   Chief Complaint  Patient presents with  . OTHER    OD 12 month f/u LS 6/18 pt would like to discuss metoprolol and diltiazem needs clarification . Meds reviewed verbally with pt.    HPI:  Tina Berger is a 67 year old woman with a history of  Raynaud's disease, takes diltiazem in the winter hyperlipidemia, LDL 149 HTN,  obesity, Will chronic lower extremity swelling   DM, HBA1C 6.3 CT scan chest 2013 with no plaque, no CAD who presents for followup of her palpitations and hypertension, edema.  In follow-up today she continues to have problems with her raynauds Symptoms have been stable Symptoms better when standing also sitting on the edge of the seat Minimal symptoms in her hands, which Selena Batten of symptoms in her toes  No regular exercise program Continues to take Metoprolol twice a day Does not take diltiazem on a regular basis  Taking diuretic daily Torsemide one a day in the summer  Goes to [facility]  fibromyalgia, takes pain medication  CT scan chest reviewed from 2013 showing no significant aortic atherosclerosis or coronary calcification Images pulled up in the office  EKG personally reviewed by myself on todays visit shows normal sinus rhythm with rate 83 bpm, no significant ST or T-wave changes  Other past medical history She continues to drink a significant amount of fluid during the daytime She reports that on the metoprolol, her palpitations have been well-controlled.     Echocardiogram in October 2008 was essentially normal.   Stress test in October 2008 showed no ischemia. This is a exercise treadmill. She exercised for 4 minutes 30 seconds, peak heart rate of 152 beats per minute, peak blood pressure 172/94.      PMH:   has a past medical history of Acute renal insufficiency (05/31/2014), Anemia, iron deficiency,  Bilateral lower extremity edema, Cervical dysplasia, Colon cancer screening (06/14/2014), Cough (08/08/2012), Degenerative disk disease (11/19/2011), Diabetes mellitus type II, Diverticulosis, DJD (degenerative joint disease), Drug rash (05/22/2011), Dry eyes, Edema, Elevated liver enzymes (03/21/2012), Encounter for routine gynecological examination (06/14/2014), ESOPHAGITIS (03/28/2007), Fibromyalgia, GASTRITIS (03/28/2007), GERD (gastroesophageal reflux disease), Hemorrhoids, HLD (hyperlipidemia), HNP (herniated nucleus pulposus) (2/99), HTN (hypertension), Hyperglycemia (05/13/2008), Hypothyroidism, Interstitial lung disease ([place]), Left ovarian cyst, Osteoarthritis, Osteopenia, Other screening mammogram (08/18/2011), Palpitations, 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  PSH:    Past Surgical History:  Procedure Laterality Date  . COLONOSCOPY  11/01   Diverticulosis; hemorrhoids  . HAND SURGERY     left thumb  . LASIK     bilateral    Current Outpatient Medications  Medication Sig Dispense Refill  . buPROPion (WELLBUTRIN XL) 150 MG 24 hr tablet Take 1 tablet (150 mg total) by mouth daily. 90 tablet 3  . Chlorphen-Phenyleph-ASA (ALKA-SELTZER PLUS COLD PO) Take by mouth as needed.      . cyclobenzaprine (FLEXERIL) 10 MG tablet TAKE ONE-HALF TO ONE TABLET BY MOUTH ONCE DAILY AND ONE AT BEDTIME 180 tablet 1  . Diclofenac Sodium 1.5 % SOLN Place 2 mLs onto the skin 4 (four) times daily. 3 Bottle 3  . diltiazem (CARDIZEM) 30 MG tablet Take 1 tablet (30 mg total) by mouth 3 (three) times daily as needed. 270 tablet 3  .  diphenhydrAMINE (BENADRYL) 25 mg capsule Take 25 mg by mouth as needed.      . DULoxetine (CYMBALTA) 60 MG capsule Take 1 capsule (60 mg total) by mouth daily. 90 capsule 3  . esomeprazole (NEXIUM) 20 MG capsule Take 1  capsule (20 mg total) by mouth daily. 90 capsule 3  . ferrous sulfate 325 (65 FE) MG EC tablet Take 325 mg by mouth daily with breakfast.    . glucose blood test strip One Touch Ultra stripts blue-To check sugar once daily and as needed for DM2 250.00 100 each 3  . GUAIFENESIN CR PO Take by mouth as needed.      Marland Kitchen HYDROmorphone HCl (EXALGO) 12 MG T24A SR tablet Take 12 mg by mouth daily.    Marland Kitchen HYDROmorphone HCl 16 MG T24A Take 1 tablet by mouth daily.    . hyoscyamine (LEVSIN SL) 0.125 MG SL tablet Take 1 tablet (0.125 mg total) by mouth every 4 (four) hours as needed for cramping. 270 tablet 1  . levothyroxine (SYNTHROID, LEVOTHROID) 150 MCG tablet Take 1 tablet (150 mcg total) by mouth daily. 90 tablet 3  . Magnesium 400 MG CAPS Take by mouth daily.     . metFORMIN (GLUCOPHAGE) 500 MG tablet Take 1 tablet (500 mg total) by mouth 2 (two) times daily with a meal. 180 tablet 3  . metoprolol succinate (TOPROL-XL) 50 MG 24 hr tablet Take 1 tablet (50 mg total) by mouth 2 (two) times daily. Take with or immediately following a meal. 180 tablet 3  . oxyCODONE (OXYCONTIN) 10 MG 12 hr tablet Take 10 mg by mouth 3 (three) times daily as needed.     . Probiotic Product (PROBIOTIC PO) Take 1 capsule by mouth daily.    . ramipril (ALTACE) 5 MG capsule Take 1 capsule (5 mg total) by mouth daily. 90 capsule 3  . ranitidine (ZANTAC) 300 MG tablet Take 1 tablet (300 mg total) by mouth at bedtime. 90 tablet 3  . torsemide (DEMADEX) 20 MG tablet TAKE 1 TABLET BY MOUTH TWO  TIMES DAILY AS NEEDED 180 tablet 0   No current facility-administered medications for this visit.      Allergies:   Keflex [cephalexin]; Penicillins; Amitriptyline hcl; Atorvastatin; Benicar [olmesartan medoxomil]; Ceftin [cefuroxime]; Cetirizine hcl; Ciprofloxacin; Clonidine derivatives; Diovan [valsartan]; Erythromycin; Etodolac; Fluoxetine hcl; Furosemide; Gabapentin; Naproxen sodium; Paroxetine; Pregabalin; Sulfonamide derivatives;  Tetracycline; Venlafaxine; and Cephalexin   Social History:  The patient  reports that she has quit smoking. Her smoking use included cigarettes. She has never used smokeless tobacco. She reports that she does not drink alcohol or use drugs.   Family History:   family history includes Anemia in her brother; Coronary artery disease in her mother; Diabetes in her brother and sister; Emphysema in her father; Heart disease in her brother; Lung cancer in her mother; Lymphoma in her brother and sister.    Review of Systems: Review of Systems  Constitutional: Negative.   Respiratory: Negative.   Cardiovascular: Negative.   Gastrointestinal: Negative.   Musculoskeletal: Negative.   Skin:       Raynauds  Neurological: Negative.   Psychiatric/Behavioral: Negative.   All other systems reviewed and are negative.    PHYSICAL EXAM: VS:  BP 108/70 (BP Location: Left Arm, Patient Position: Sitting, Cuff Size: Normal)   Pulse 83   Ht 5' 1.5" (1.562 m)   Wt 196 lb 4 oz (89 kg)   BMI 36.48 kg/m  , BMI Body mass index is 36.48  kg/m. Constitutional:  oriented to person, place, and time. No distress.  HENT:  Head: Normocephalic and atraumatic.  Eyes:  no discharge. No scleral icterus.  Neck: Normal range of motion. Neck supple. No JVD present.  Cardiovascular: Normal rate, regular rhythm, normal heart sounds and intact distal pulses. Exam reveals no gallop and no friction rub. No edema No murmur heard. Pulmonary/Chest: Effort normal and breath sounds normal. No stridor. No respiratory distress.  no wheezes.  no rales.  no tenderness.  Abdominal: Soft.  no distension.  no tenderness.  Musculoskeletal: Normal range of motion.  no  tenderness or deformity.  Neurological:  normal muscle tone. Coordination normal. No atrophy Skin: Skin is warm and dry. No rash noted. not diaphoretic.  Psychiatric:  normal mood and affect. behavior is normal. Thought content normal.    Recent Labs: 08/10/2017: ALT  14; TSH 0.85 10/24/2017: BUN 25; Creatinine, Ser 1.00; Magnesium 2.2; Potassium 4.9; Sodium 140    Lipid Panel Lab Results  Component Value Date   CHOL 209 (H) 08/10/2017   HDL 43.90 08/10/2017   LDLCALC 137 (H) 08/10/2017   TRIG 142.0 08/10/2017      Wt Readings from Last 3 Encounters:  06/20/18 196 lb 4 oz (89 kg)  05/05/18 201 lb (91.2 kg)  11/09/17 200 lb (90.7 kg)       ASSESSMENT AND PLAN:   Mixed hyperlipidemia - Plan: EKG 12-Lead Previous CT scan with no coronary calcification or aortic atherosclerosis  She is inclined not to take a cholesterol medication  Essential hypertension - Plan: EKG 12-Lead Blood pressure is well controlled on today's visit. No changes made to the medications.  Type 2 diabetes mellitus without complication, without long-term current use of insulin ([place]) - Plan: EKG 12-Lead We have encouraged continued exercise, careful diet management in an effort to lose weight. Again discussed with her  Raynaud's disease without gangrene Taking diltiazem mainly in the winter,  tolerable in the summer Symptoms better when standing New prescription sent in for diltiazem  Bilateral lower extremity edema Minimal symptoms on today's visit Recommend she try torsemide every other day to avoid renal dysfunction and prerenal state  Disposition:   F/U as needed Refill prescriptions can be obtained through primary care if needed   Total encounter time more than 25 minutes  Greater than 50% was spent in counseling and coordination of care with the patient    Orders Placed This Encounter  Procedures  . EKG 12-Lead     Signed, Esmond Plants, M.D., Ph.D. 06/20/2018  [place], [place]

## 2018-06-27 DIAGNOSIS — M5137 Other intervertebral disc degeneration, lumbosacral region: Secondary | ICD-10-CM | POA: Diagnosis not present

## 2018-06-27 DIAGNOSIS — M4725 Other spondylosis with radiculopathy, thoracolumbar region: Secondary | ICD-10-CM | POA: Diagnosis not present

## 2018-06-27 DIAGNOSIS — Z79891 Long term (current) use of opiate analgesic: Secondary | ICD-10-CM | POA: Diagnosis not present

## 2018-06-27 DIAGNOSIS — G894 Chronic pain syndrome: Secondary | ICD-10-CM | POA: Diagnosis not present

## 2018-06-27 DIAGNOSIS — M961 Postlaminectomy syndrome, not elsewhere classified: Secondary | ICD-10-CM | POA: Diagnosis not present

## 2018-06-30 ENCOUNTER — Other Ambulatory Visit: Payer: Self-pay | Admitting: *Deleted

## 2018-06-30 MED ORDER — HYOSCYAMINE SULFATE 0.125 MG SL SUBL: 0.1250 mg | SUBLINGUAL_TABLET | SUBLINGUAL | 0 refills | Status: DC | PRN

## 2018-07-27 ENCOUNTER — Other Ambulatory Visit: Payer: Self-pay | Admitting: Family Medicine

## 2018-08-13 ENCOUNTER — Telehealth: Payer: Self-pay | Admitting: Family Medicine

## 2018-08-13 DIAGNOSIS — E782 Mixed hyperlipidemia: Secondary | ICD-10-CM

## 2018-08-13 DIAGNOSIS — I10 Essential (primary) hypertension: Secondary | ICD-10-CM

## 2018-08-13 DIAGNOSIS — E559 Vitamin D deficiency, unspecified: Secondary | ICD-10-CM

## 2018-08-13 DIAGNOSIS — E039 Hypothyroidism, unspecified: Secondary | ICD-10-CM

## 2018-08-13 DIAGNOSIS — E119 Type 2 diabetes mellitus without complications: Secondary | ICD-10-CM

## 2018-08-13 NOTE — Telephone Encounter (Signed)
-----   Message from Eustace Pen, LPN sent at 80/32/1224  2:56 PM EDT ----- Regarding: Labs 10/28 Lab orders needed. Thank you.  Insurance:  [facility] Medicare

## 2018-08-14 ENCOUNTER — Ambulatory Visit (INDEPENDENT_AMBULATORY_CARE_PROVIDER_SITE_OTHER): Payer: Medicare Other

## 2018-08-14 VITALS — BP 122/82 | HR 80 | Temp 99.2°F | Ht 62.0 in | Wt 196.5 lb

## 2018-08-14 DIAGNOSIS — E782 Mixed hyperlipidemia: Secondary | ICD-10-CM | POA: Diagnosis not present

## 2018-08-14 DIAGNOSIS — Z23 Encounter for immunization: Secondary | ICD-10-CM

## 2018-08-14 DIAGNOSIS — E039 Hypothyroidism, unspecified: Secondary | ICD-10-CM | POA: Diagnosis not present

## 2018-08-14 DIAGNOSIS — I10 Essential (primary) hypertension: Secondary | ICD-10-CM

## 2018-08-14 DIAGNOSIS — E119 Type 2 diabetes mellitus without complications: Secondary | ICD-10-CM

## 2018-08-14 DIAGNOSIS — E559 Vitamin D deficiency, unspecified: Secondary | ICD-10-CM | POA: Diagnosis not present

## 2018-08-14 DIAGNOSIS — Z0000 Encounter for general adult medical examination without abnormal findings: Secondary | ICD-10-CM

## 2018-08-14 LAB — LIPID PANEL
CHOLESTEROL: 215 mg/dL — AB (ref 0–200)
HDL: 45.8 mg/dL (ref >39.00)
LDL CALC: 139 mg/dL — AB (ref 0–99)
NonHDL: 169.55
Total CHOL/HDL Ratio: 5
Triglycerides: 153 mg/dL — ABNORMAL HIGH (ref 0.0–149.0)
VLDL: 30.6 mg/dL (ref 0.0–40.0)

## 2018-08-14 LAB — COMPREHENSIVE METABOLIC PANEL
ALT: 11 U/L (ref 0–35)
AST: 14 U/L (ref 0–37)
Albumin: 4.2 g/dL (ref 3.5–5.2)
Alkaline Phosphatase: 72 U/L (ref 39–117)
BUN: 20 mg/dL (ref 6–23)
CALCIUM: 9.2 mg/dL (ref 8.4–10.5)
CO2: 32 mEq/L (ref 19–32)
CREATININE: 0.94 mg/dL (ref 0.40–1.20)
Chloride: 100 mEq/L (ref 96–112)
GFR: 62.84 mL/min (ref >60.00)
Glucose, Bld: 103 mg/dL — ABNORMAL HIGH (ref 70–99)
Potassium: 4.7 mEq/L (ref 3.5–5.1)
Sodium: 139 mEq/L (ref 135–145)
TOTAL PROTEIN: 7.2 g/dL (ref 6.0–8.3)
Total Bilirubin: 0.3 mg/dL (ref 0.2–1.2)

## 2018-08-14 LAB — CBC WITH DIFFERENTIAL/PLATELET
BASOS PCT: 0.9 % (ref 0.0–3.0)
Basophils Absolute: 0.1 10*3/uL (ref 0.0–0.1)
EOS ABS: 0.2 10*3/uL (ref 0.0–0.7)
EOS PCT: 2.5 % (ref 0.0–5.0)
HEMATOCRIT: 38.9 % (ref 36.0–46.0)
HEMOGLOBIN: 12.9 g/dL (ref 12.0–15.0)
LYMPHS PCT: 26.5 % (ref 12.0–46.0)
Lymphs Abs: 2 10*3/uL (ref 0.7–4.0)
MCHC: 33.2 g/dL (ref 30.0–36.0)
MCV: 86.8 fl (ref 78.0–100.0)
MONOS PCT: 10.3 % (ref 3.0–12.0)
Monocytes Absolute: 0.8 10*3/uL (ref 0.1–1.0)
Neutro Abs: 4.4 10*3/uL (ref 1.4–7.7)
Neutrophils Relative %: 59.8 % (ref 43.0–77.0)
Platelets: 312 10*3/uL (ref 150.0–400.0)
RBC: 4.48 Mil/uL (ref 3.87–5.11)
RDW: 13.9 % (ref 11.5–15.5)
WBC: 7.4 10*3/uL (ref 4.0–10.5)

## 2018-08-14 LAB — TSH: TSH: 0.29 u[IU]/mL — ABNORMAL LOW (ref 0.35–4.50)

## 2018-08-14 LAB — VITAMIN D 25 HYDROXY (VIT D DEFICIENCY, FRACTURES): VITD: 96.19 ng/mL (ref 30.00–100.00)

## 2018-08-14 LAB — HEMOGLOBIN A1C: Hgb A1c MFr Bld: 6.4 % (ref 4.6–6.5)

## 2018-08-14 NOTE — Progress Notes (Signed)
PCP notes:   Health maintenance:  A1C - completed Flu vaccine - administered  Abnormal screenings:   None  Patient concerns:   None  Nurse concerns:  None  Next PCP appt:   08/21/18 @ 0930  I reviewed health advisor's note, was available for consultation, and agree with documentation and plan. Loura Pardon MD

## 2018-08-14 NOTE — Progress Notes (Signed)
Subjective:   Tina Berger is a 67 y.o. female who presents for Medicare Annual (Subsequent) preventive examination.  Review of Systems:  N/A Cardiac Risk Factors include: advanced age (>39men, >33 women);diabetes mellitus;dyslipidemia;hypertension;obesity (BMI >30kg/m2)     Objective:     Vitals: BP 122/82 (BP Location: Right Arm, Patient Position: Sitting, Cuff Size: Large)   Pulse 80   Temp 99.2 F (37.3 C) (Oral)   Ht 5\' 2"  (1.575 m) Comment: no shoes  Wt 196 lb 8 oz (89.1 kg)   SpO2 96%   BMI 35.94 kg/m   Body mass index is 35.94 kg/m.  Advanced Directives 08/14/2018 10/24/2017 08/10/2017 07/26/2016  Does Patient Have a Medical Advance Directive? Yes Yes Yes Yes  Type of Paramedic of [place];Living will Living will [place];Living will [place]  Does patient want to make changes to medical advance directive? - - - No - Patient declined  Copy of [place] in Chart? No - copy requested - No - copy requested No - copy requested    Tobacco Social History   Tobacco Use  Smoking Status Former Smoker  . Types: Cigarettes  Smokeless Tobacco Never Used  Tobacco Comment   quit over 40 years     Counseling given: No Comment: quit over 40 years   Clinical Intake:  Pre-visit preparation completed: Yes  Pain : No/denies pain Pain Score: 0-No pain     Nutritional Status: BMI > 30  Obese Nutritional Risks: None Diabetes: Yes CBG done?: No Did pt. bring in CBG monitor from home?: No  How often do you need to have someone help you when you read instructions, pamphlets, or other written materials from your doctor or pharmacy?: 1 - Never What is the last grade level you completed in school?: 12th grade + 1 yr college  Interpreter Needed?: No  Comments: pt lives with spouse Information entered by :: LPinson, LPN  Past Medical History:  Diagnosis Date  . Acute renal insufficiency  05/31/2014  . Anemia, iron deficiency   . Bilateral lower extremity edema    a. uses torsemide  . Cervical dysplasia    abnormal paps  . Colon cancer screening 06/14/2014  . Cough 08/08/2012  . Degenerative disk disease 11/19/2011  . Diabetes mellitus type II    Diet controlled  . Diverticulosis   . DJD (degenerative joint disease)   . Drug rash 05/22/2011  . Dry eyes   . Edema   . Elevated liver enzymes 03/21/2012  . Encounter for routine gynecological examination 06/14/2014  . ESOPHAGITIS 03/28/2007   Qualifier: Hospitalized for  By: Marcelino Scot CMA, [place]    . Fibromyalgia   . GASTRITIS 03/28/2007   Qualifier: History of  By: Marcelino Scot CMA, [place]    . GERD (gastroesophageal reflux disease)   . Hemorrhoids    external  . HLD (hyperlipidemia)   . HNP (herniated nucleus pulposus) 2/99   T6,7,8 with DJD  . HTN (hypertension)   . Hyperglycemia 05/13/2008   Qualifier: Diagnosis of  By: Glori Bickers MD, Carmell [place]   . Hypothyroidism   . Interstitial lung disease ([place])    ?  Marland Kitchen Left ovarian cyst    x 3, rupture  . Osteoarthritis    hands  . Osteopenia    mild-11/01; improved 12/05  . Other screening mammogram 08/18/2011  . Palpitations   . PERSONAL HISTORY ALLERGY UNSPEC MEDICINAL AGENT 03/18/2010   Qualifier: Diagnosis of  ByGlori Bickers MD, Carmell [place]   . Raynaud's disease   . Recurrent HSV (herpes simplex virus)    lesions in nose  . Rhinitis 03/21/2012  . Routine general medical examination at a health care facility 03/28/2011   Past Surgical History:  Procedure Laterality Date  . COLONOSCOPY  11/01   Diverticulosis; hemorrhoids  . HAND SURGERY     left thumb  . LASIK     bilateral   Family History  Problem Relation Age of Onset  . Emphysema Father        + smoker  . Lung cancer Mother        + smoker  . Coronary artery disease Mother        relatively young  . Lymphoma Brother   . Lymphoma Sister   . Diabetes Brother   . Diabetes Sister   . Heart disease Brother   .  Anemia Brother        aplactic   . Breast cancer Neg Hx    Social History   Socioeconomic History  . Marital status: Married    Spouse name: Not on file  . Number of children: 1  . Years of education: Not on file  . Highest education level: Not on file  Occupational History  . 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 Financial resource strain: Not on file  . Food insecurity:    Worry: Not on file    Inability: Not on file  . Transportation needs:    Medical: Not on file    Non-medical: Not on file  Tobacco Use  . Smoking status: Former Smoker    Types: Cigarettes  . Smokeless tobacco: Never Used  . Tobacco comment: quit over 40 years  Substance and Sexual Activity  . Alcohol use: No    Alcohol/week: 0.0 standard drinks  . Drug use: No  . Sexual activity: Yes  Lifestyle  . Physical activity:    Days per week: Not on file    Minutes per session: Not on file  . Stress: Not on file  Relationships  . Social connections:    Talks on phone: Not on file    Gets together: Not on file    Attends religious service: Not on file    Active member of club or organization: Not on file    Attends meetings of clubs or organizations: Not on file    Relationship status: Not on file  Other Topics Concern  . Not on file  Social History Narrative   Married      1 child      Network engineer to dean at [employer]      Does regularly exercise      Daily caffeine use: 2/day          Outpatient Encounter Medications as of 08/14/2018  Medication Sig  . buPROPion (WELLBUTRIN XL) 150 MG 24 hr tablet TAKE 1 TABLET BY MOUTH  DAILY  . Chlorphen-Phenyleph-ASA (ALKA-SELTZER PLUS COLD PO) Take by mouth as needed.    . cyclobenzaprine (FLEXERIL) 10 MG tablet TAKE ONE-HALF TO ONE TABLET BY MOUTH ONCE DAILY AND ONE AT BEDTIME  . Diclofenac Sodium 1.5 % SOLN Place 2 mLs onto the skin 4 (four) times daily.  Marland Kitchen diltiazem (CARDIZEM) 30 MG tablet Take 1 tablet (30  mg total) by mouth 3 (three) times daily as needed.  . diphenhydrAMINE (BENADRYL) 25 mg capsule Take 25 mg by  mouth as needed.    . DULoxetine (CYMBALTA) 60 MG capsule TAKE 1 CAPSULE BY MOUTH  DAILY  . esomeprazole (NEXIUM) 20 MG capsule Take 1 capsule (20 mg total) by mouth daily.  . ferrous sulfate 325 (65 FE) MG EC tablet Take 325 mg by mouth daily with breakfast.  . glucose blood test strip One Touch Ultra stripts blue-To check sugar once daily and as needed for DM2 250.00  . GUAIFENESIN CR PO Take by mouth as needed.    Marland Kitchen HYDROmorphone HCl (EXALGO) 12 MG T24A SR tablet Take 12 mg by mouth daily.  Marland Kitchen HYDROmorphone HCl 16 MG T24A Take 1 tablet by mouth daily.  . hyoscyamine (LEVSIN SL) 0.125 MG SL tablet Take 1 tablet (0.125 mg total) by mouth every 4 (four) hours as needed for cramping.  Marland Kitchen levothyroxine (SYNTHROID, LEVOTHROID) 150 MCG tablet TAKE 1 TABLET BY MOUTH  DAILY  . Magnesium 400 MG CAPS Take by mouth daily.   . metFORMIN (GLUCOPHAGE) 500 MG tablet TAKE 1 TABLET BY MOUTH TWO  TIMES DAILY WITH A MEAL  . metoprolol succinate (TOPROL-XL) 50 MG 24 hr tablet Take 1 tablet (50 mg total) by mouth 2 (two) times daily. Take with or immediately following a meal.  . oxyCODONE (OXYCONTIN) 10 MG 12 hr tablet Take 10 mg by mouth 3 (three) times daily as needed.   . Probiotic Product (PROBIOTIC PO) Take 1 capsule by mouth daily.  . ramipril (ALTACE) 5 MG capsule TAKE 1 CAPSULE BY MOUTH  DAILY  . ranitidine (ZANTAC) 300 MG tablet TAKE 1 TABLET BY MOUTH AT  BEDTIME  . torsemide (DEMADEX) 20 MG tablet TAKE 1 TABLET BY MOUTH TWO  TIMES DAILY AS NEEDED   No facility-administered encounter medications on file as of 08/14/2018.     Activities of Daily Living In your present state of health, do you have any difficulty performing the following activities: 08/14/2018  Hearing? N  Vision? N  Difficulty concentrating or making decisions? N  Walking or climbing stairs? N  Dressing or bathing? N  Doing  errands, shopping? N  Preparing Food and eating ? N  Using the Toilet? N  In the past six months, have you accidently leaked urine? N  Do you have problems with loss of bowel control? N  Managing your Medications? N  Managing your Finances? N  Housekeeping or managing your Housekeeping? N  Some recent data might be hidden    Patient Care Team: Tower, [name], MD as PCP - General [name], [name], MD as Consulting Physician (Cardiology)    Assessment:   This is a routine wellness examination for [place].   Hearing Screening   125Hz  250Hz  500Hz  1000Hz  2000Hz  3000Hz  4000Hz  6000Hz  8000Hz   Right ear:   40 40 40  40    Left ear:   40 40 40  40    Vision Screening Comments: Vision exam in June 2019 with Dr. George Ina   Exercise Activities and Dietary recommendations Current Exercise Habits: Home exercise routine, Type of exercise: Other - see comments(pilates reformer), Time (Minutes): 30, Frequency (Times/Week): 4, Weekly Exercise (Minutes/Week): 120, Intensity: Moderate, Exercise limited by: None identified  Goals    . Increase physical activity     Starting 08/14/2018, I will continue to exercise at least 30 minutes 3-4 days per week.        Fall Risk Fall Risk  08/14/2018 11/09/2017 10/24/2017 08/10/2017 07/26/2016  Falls in the past year? No No No No No  Depression Screen PHQ 2/9 Scores 08/14/2018 11/09/2017 10/24/2017 08/10/2017  PHQ - 2 Score 0 0 0 0  PHQ- 9 Score 0 - - 0     Cognitive Function MMSE - Mini Mental State Exam 08/14/2018 08/10/2017 07/26/2016 07/26/2016  Orientation to time 5 5 - 5  Orientation to Place 5 5 - 5  Registration 3 3 - 3  Attention/ Calculation 0 0 - 0  Recall 3 3 (No Data) 1  Recall-comments - - pt was unable to recall 2 of 3 words -  Language- name 2 objects 0 0 - 0  Language- repeat 1 1 - 1  Language- follow 3 step command 3 3 - 3  Language- read & follow direction 0 0 - 0  Write a sentence 0 0 - 0  Copy design 0 0 - 0  Total score 20 20  - 18     PLEASE NOTE: A Mini-Cog screen was completed. Maximum score is 20. A value of 0 denotes this part of [place] MMSE was not completed or the patient failed this part of the Mini-Cog screening.   Mini-Cog Screening Orientation to Time - Max 5 pts Orientation to Place - Max 5 pts Registration - Max 3 pts Recall - Max 3 pts Language Repeat - Max 1 pts Language Follow 3 Step Command - Max 3 pts     Immunization History  Administered Date(s) Administered  . Influenza Split 06/19/2011  . Influenza Whole 08/01/2009  . Influenza, High Dose Seasonal PF 08/14/2018  . Influenza,inj,Quad PF,6+ Mos 06/14/2014, 08/25/2015, 07/26/2016, 08/19/2017  . Pneumococcal Conjugate-13 08/25/2015  . Pneumococcal Polysaccharide-23 08/25/2016  . Td 11/25/2003  . Tdap 06/14/2014  . Zoster 09/16/2014    Screening Tests Health Maintenance  Topic Date Due  . FOOT EXAM  08/19/2018  . OPHTHALMOLOGY EXAM  08/29/2018  . HEMOGLOBIN A1C  02/13/2019  . MAMMOGRAM  04/05/2019  . Fecal DNA (Cologuard)  09/04/2019  . TETANUS/TDAP  06/14/2024  . INFLUENZA VACCINE  Completed  . DEXA SCAN  Completed  . Hepatitis C Screening  Completed  . PNA vac Low Risk Adult  Completed      Plan:     I have personally reviewed, addressed, and noted the following in the patient's chart:  A. Medical and social history B. Use of alcohol, tobacco or illicit drugs  C. Current medications and supplements D. Functional ability and status E.  Nutritional status F.  Physical activity G. Advance directives H. List of other physicians I.  Hospitalizations, surgeries, and ER visits in previous 12 months J.  [place] to include hearing, vision, cognitive, depression L. Referrals and appointments - none  In addition, I have reviewed and discussed with patient certain preventive protocols, quality metrics, and best practice recommendations. A written personalized care plan for preventive services as well as  general preventive health recommendations were provided to patient.  See attached scanned questionnaire for additional information.   Signed,   Lindell Noe, MHA, BS, LPN Health Coach

## 2018-08-14 NOTE — Patient Instructions (Signed)
Tina Berger , Thank you for taking time to come for your Medicare Wellness Visit. I appreciate your ongoing commitment to your health goals. Please review the following plan we discussed and let me know if I can assist you in the future.   These are the goals we discussed: Goals    . Increase physical activity     Starting 08/14/2018, I will continue to exercise at least 30 minutes 3-4 days per week.        This is a list of the screening recommended for you and due dates:  Health Maintenance  Topic Date Due  . Complete foot exam   08/19/2018  . Eye exam for diabetics  08/29/2018  . Hemoglobin A1C  02/13/2019  . Mammogram  04/05/2019  . Cologuard (Stool DNA test)  09/04/2019  . Tetanus Vaccine  06/14/2024  . Flu Shot  Completed  . DEXA scan (bone density measurement)  Completed  .  Hepatitis C: One time screening is recommended by Center for Disease Control  (CDC) for  adults born from [birth year] through 1966.   Completed  . Pneumonia vaccines  Completed   Preventive Care for Adults  A healthy lifestyle and preventive care can promote health and wellness. Preventive health guidelines for adults include the following key practices.  . A routine yearly physical is a good way to check with your health care provider about your health and preventive screening. It is a chance to share any concerns and updates on your health and to receive a thorough exam.  . Visit your dentist for a routine exam and preventive care every 6 months. Brush your teeth twice a day and floss once a day. Good oral hygiene prevents tooth decay and gum disease.  . The frequency of eye exams is based on your age, health, family medical history, use  of contact lenses, and other factors. Follow your health care provider's recommendations for frequency of eye exams.  . Eat a healthy diet. Foods like vegetables, fruits, whole grains, low-fat dairy products, and lean protein foods contain the nutrients you need without too  many calories. Decrease your intake of foods high in solid fats, added sugars, and salt. Eat the right amount of calories for you. Get information about a proper diet from your health care provider, if necessary.  . Regular physical exercise is one of the most important things you can do for your health. Most adults should get at least 150 minutes of moderate-intensity exercise (any activity that increases your heart rate and causes you to sweat) each week. In addition, most adults need muscle-strengthening exercises on 2 or more days a week.  Silver Sneakers may be a benefit available to you. To determine eligibility, you may visit the website: www.silversneakers.com or contact program at 351-144-4826 Mon-Fri between 8AM-8PM.   . Maintain a healthy weight. The body mass index (BMI) is a screening tool to identify possible weight problems. It provides an estimate of body fat based on height and weight. Your health care provider can find your BMI and can help you achieve or maintain a healthy weight.   For adults 20 years and older: ? A BMI below 18.5 is considered underweight. ? A BMI of 18.5 to 24.9 is normal. ? A BMI of 25 to 29.9 is considered overweight. ? A BMI of 30 and above is considered obese.   . Maintain normal blood lipids and cholesterol levels by exercising and minimizing your intake of saturated fat. Eat a balanced  diet with plenty of fruit and vegetables. Blood tests for lipids and cholesterol should begin at age 70 and be repeated every 5 years. If your lipid or cholesterol levels are high, you are over 50, or you are at high risk for heart disease, you may need your cholesterol levels checked more frequently. Ongoing high lipid and cholesterol levels should be treated with medicines if diet and exercise are not working.  . If you smoke, find out from your health care provider how to quit. If you do not use tobacco, please do not start.  . If you choose to drink alcohol, please  do not consume more than 2 drinks per day. One drink is considered to be 12 ounces (355 mL) of beer, 5 ounces (148 mL) of wine, or 1.5 ounces (44 mL) of liquor.  . If you are 63-27 years old, ask your health care provider if you should take aspirin to prevent strokes.  . Use sunscreen. Apply sunscreen liberally and repeatedly throughout the day. You should seek shade when your shadow is shorter than you. Protect yourself by wearing long sleeves, pants, a wide-brimmed hat, and sunglasses year round, whenever you are outdoors.  . Once a month, do a whole body skin exam, using a mirror to look at the skin on your back. Tell your health care provider of new moles, moles that have irregular borders, moles that are larger than a pencil eraser, or moles that have changed in shape or color.

## 2018-08-21 ENCOUNTER — Ambulatory Visit (INDEPENDENT_AMBULATORY_CARE_PROVIDER_SITE_OTHER): Payer: Medicare Other | Admitting: Family Medicine

## 2018-08-21 ENCOUNTER — Encounter: Payer: Self-pay | Admitting: Family Medicine

## 2018-08-21 VITALS — BP 126/74 | HR 72 | Temp 99.4°F | Ht 62.0 in | Wt 200.2 lb

## 2018-08-21 DIAGNOSIS — I10 Essential (primary) hypertension: Secondary | ICD-10-CM | POA: Diagnosis not present

## 2018-08-21 DIAGNOSIS — E119 Type 2 diabetes mellitus without complications: Secondary | ICD-10-CM

## 2018-08-21 DIAGNOSIS — J849 Interstitial pulmonary disease, unspecified: Secondary | ICD-10-CM

## 2018-08-21 DIAGNOSIS — Z6836 Body mass index (BMI) 36.0-36.9, adult: Secondary | ICD-10-CM

## 2018-08-21 DIAGNOSIS — M8589 Other specified disorders of bone density and structure, multiple sites: Secondary | ICD-10-CM | POA: Diagnosis not present

## 2018-08-21 DIAGNOSIS — E559 Vitamin D deficiency, unspecified: Secondary | ICD-10-CM

## 2018-08-21 DIAGNOSIS — E039 Hypothyroidism, unspecified: Secondary | ICD-10-CM | POA: Diagnosis not present

## 2018-08-21 DIAGNOSIS — E2839 Other primary ovarian failure: Secondary | ICD-10-CM

## 2018-08-21 DIAGNOSIS — K219 Gastro-esophageal reflux disease without esophagitis: Secondary | ICD-10-CM

## 2018-08-21 DIAGNOSIS — Z0000 Encounter for general adult medical examination without abnormal findings: Secondary | ICD-10-CM

## 2018-08-21 DIAGNOSIS — E782 Mixed hyperlipidemia: Secondary | ICD-10-CM

## 2018-08-21 DIAGNOSIS — E66812 Obesity, class 2: Secondary | ICD-10-CM

## 2018-08-21 MED ORDER — LEVOTHYROXINE SODIUM 125 MCG PO TABS: 125.0000 ug | ORAL_TABLET | Freq: Every day | ORAL | 3 refills | Status: DC

## 2018-08-21 MED ORDER — FAMOTIDINE 40 MG PO TABS: 40.0000 mg | ORAL_TABLET | Freq: Every day | ORAL | 3 refills | Status: DC

## 2018-08-21 NOTE — Assessment & Plan Note (Signed)
Ranitidine not available Change to pepcid 40 mg Update if problems

## 2018-08-21 NOTE — Assessment & Plan Note (Signed)
bp in fair control at this time  BP Readings from Last 1 Encounters:  08/21/18 126/74   No changes needed Most recent labs reviewed  Disc lifstyle change with low sodium diet and exercise

## 2018-08-21 NOTE — Patient Instructions (Addendum)
Try to get most of your carbohydrates from produce (with the exception of white potatoes)  Eat less bread/pasta/rice/snack foods/cereals/sweets and other items from the middle of the grocery store (processed carbs)   Keep exercising   We will reduce levothyroxine dose to 125 mcg   Change zantac to pepcid 40 mg each evening

## 2018-08-21 NOTE — Assessment & Plan Note (Signed)
Discussed how this problem influences overall health and the risks it imposes  Reviewed plan for weight loss with lower calorie diet (via better food choices and also portion control or program like weight watchers) and exercise building up to or more than 30 minutes 5 days per week including some aerobic activity    

## 2018-08-21 NOTE — Assessment & Plan Note (Signed)
Due for 1 y dexa  D level good at 96 One fall/no fx Exercise as tolerated

## 2018-08-21 NOTE — Assessment & Plan Note (Signed)
Lab Results  Component Value Date   TSH 0.29 (L) 08/14/2018    Dec levothyroxine to 124 mcg daily  Taking correctly  Re check TSH in 6 weeks

## 2018-08-21 NOTE — Assessment & Plan Note (Signed)
Reviewed health habits including diet and exercise and skin cancer prevention Reviewed appropriate screening tests for age  Also reviewed health mt list, fam hx and immunization status , as well as social and family history   See HPI amw reviewed  Labs rev  dexa ordered  utd cologuard for colon screening  Low glycemic diet recommended

## 2018-08-21 NOTE — Assessment & Plan Note (Signed)
Lab Results  Component Value Date   HGBA1C 6.4 08/14/2018   This is stable and well controlled Disc eye and foot care  Low glycemic diet and wt loss adv F/u 6 mo

## 2018-08-21 NOTE — Assessment & Plan Note (Signed)
Level in 90s with supplementation Vitamin D level is therapeutic with current supplementation Disc importance of this to bone and overall health

## 2018-08-21 NOTE — Assessment & Plan Note (Signed)
Disc goals for lipids and reasons to control them Rev last labs with pt Rev low sat fat diet in detail Not at goal Intol of statins  Stressed imp of diet

## 2018-08-21 NOTE — Progress Notes (Signed)
Subjective:    Patient ID: Tina Berger, female    DOB: [birth year], 67 y.o.   MRN: [id number]  HPI Here for health maintenance exam and to review chronic medical problems    Taking care of some stay kittens lately   Over summer-diag with marginal keratosis -eyes/was on abx  Then a bad TMJ flare up   6-7 weeks Then skin infection /folliculitis from ? Bug bite  One thing after another   Wt Readings from Last 3 Encounters:  08/21/18 200 lb 4 oz (90.8 kg)  08/14/18 196 lb 8 oz (89.1 kg)  06/20/18 196 lb 4 oz (89 kg)  feels good now Taking care of herself  Exercise- Research officer, political party and glider in the house / walks when she can tolerate it -indoors  36.63 kg/m   Had amw on 10/28 Given flu vaccine  Eye exam 11/18- has one planned in dec   Mammogram 6/19  Self breast exam - no lumps   cologuard 11/17   dexa 11/17 -osteopenia lowest T score -1.8 Takes vit D-level is 96 One fall  (stumbled putting kitten in car - door started coming down- she had to sit down quickly)  No fx   zostavax 11/15  . bp is stable today  No cp or palpitations or headaches or edema  No side effects to medicines  BP Readings from Last 3 Encounters:  08/21/18 126/74  08/14/18 122/82  06/20/18 108/70    DM2 Lab Results  Component Value Date   HGBA1C 6.4 08/14/2018  stable Eating healthy overall  Working on exercise    Hypothyroidism  Pt has no clinical changes No change in energy level/ hair or skin/ edema and no tremor Lab Results  Component Value Date   TSH 0.29 (L) 08/14/2018    Takes 150 mcg daily  Taking in am away from food or other meds Will reduce dose    Hyperlipidemia Lab Results  Component Value Date   CHOL 215 (H) 08/14/2018   CHOL 209 (H) 08/10/2017   CHOL 229 (H) 07/26/2016   Lab Results  Component Value Date   HDL 45.80 08/14/2018   HDL 43.90 08/10/2017   HDL 45.20 07/26/2016   Lab Results  Component Value Date   LDLCALC 139 (H) 08/14/2018   LDLCALC  137 (H) 08/10/2017   LDLCALC 126 (H) 06/11/2014   Lab Results  Component Value Date   TRIG 153.0 (H) 08/14/2018   TRIG 142.0 08/10/2017   TRIG 232.0 (H) 07/26/2016   Lab Results  Component Value Date   CHOLHDL 5 08/14/2018   CHOLHDL 5 08/10/2017   CHOLHDL 5 07/26/2016   Lab Results  Component Value Date   LDLDIRECT 149.0 07/26/2016   LDLDIRECT 136.0 08/25/2015   LDLDIRECT 135.6 10/08/2011  intol of statins  Does watch diet   Needs to change her zantac - back order   She goes to pain clinic in [place]  Had to bump up dose - when she had TMJ   Patient Active Problem List   Diagnosis Date Noted  . Estrogen deficiency 08/21/2018  . TMJ (dislocation of temporomandibular joint), initial encounter 05/05/2018  . Anterior neck pain 05/05/2018  . Chronic pain syndrome 11/09/2017  . Chronic upper extremity pain [facility]) (Bilateral) (L>R) 11/09/2017  . Fibromyalgia syndrome 11/09/2017  . Osteoarthritis 11/09/2017  . Osteoarthritis of lumbar facet joint (Bilateral) 11/09/2017  . Lumbar facet arthropathy (Bilateral) 11/09/2017  . Lumbar facet syndrome (Bilateral) (L>R) 11/09/2017  .  Lumbar foraminal stenosis (multilevel) (Bilateral) 11/09/2017  . DDD (degenerative disc disease), lumbar 11/09/2017  . Thoracic levoscoliosis 11/09/2017  . Thoracic facet syndrome (Bilateral) (L>R) 11/09/2017  . Thoracic facet arthropathy (Bilateral) (R>L) 11/09/2017  . DDD (degenerative disc disease), thoracic 11/09/2017  . Thoracolumbar IVDD 11/09/2017  . DDD (degenerative disc disease), cervical 11/09/2017  . Osteoarthritis of cervical facet (Bilateral) 11/09/2017  . Grade 1 Anterolisthesis of C7 over T1 11/09/2017  . Cervical foraminal stenosis (Bilateral) 11/09/2017  . History of fusion of cervical spine (C5-6 ACDF) 11/09/2017  . Cervical facet syndrome (Bilateral) 11/09/2017  . Disorder of skeletal system 11/09/2017  . Cervical radiculitis (Bilateral) 11/09/2017  . Lumbar  Epidural lipomatosis 11/09/2017  . Chronic sacroiliac joint pain (Bilateral) (L>R) 11/09/2017  . Chronic hip pain (Bilateral) (L>R) 11/09/2017  . Chronic upper back pain (Primary Area of Pain) (midline) 10/24/2017  . Chronic neck pain (Secondary Area of Pain) (Bilateral)  (L>R) 10/24/2017  . Chronic low back pain (Fourth Area of Pain) (Bilateral) (L>R) 10/24/2017  . Chronic lower extremity pain (Fifth Area of Pain) (Bilateral) (L>R) 10/24/2017  . Lumbar Grade 1 Retrolisthesis of L1-2 and L2-3 10/24/2017  . Other long term (current) drug therapy 10/24/2017  . Other specified health status 10/24/2017  . Long term current use of opiate analgesic 10/24/2017  . Long term prescription opiate use 10/24/2017  . Opiate use 10/24/2017  . DM type 2 (diabetes mellitus, type 2) ([place]) 06/14/2014  . Pharmacologic therapy 06/14/2014  . Bilateral lower extremity edema   . Post-menopausal 06/23/2012  . Interstitial lung disease ([place]) 03/21/2012  . Lumbar disc disease with radiculopathy 11/18/2011  . HTN (hypertension) 07/30/2011  . Raynaud disease 07/30/2011  . Obesity 07/30/2011  . Routine general medical examination at a health care facility 03/28/2011  . Palpitations 04/27/2010  . Problems influencing health status 12/11/2009  . DEPRESSION 06/14/2008  . Vitamin D deficiency 05/13/2008  . ANXIETY 03/25/2008  . Osteopenia 03/25/2008  . Hypothyroidism 03/28/2007  . Mixed hyperlipidemia 03/28/2007  . Other iron deficiency anemias 03/28/2007  . PANIC ATTACK 03/28/2007  . KERATOCONJUNCTIVITIS SICCA 03/28/2007  . Mitral valve disorder 03/28/2007  . ABNORMAL HEART RHYTHMS 03/28/2007  . Raynaud's syndrome 03/28/2007  . GERD 03/28/2007  . IBS 03/28/2007  . Rosacea 03/28/2007  . PLANTAR FASCIITIS 03/28/2007  . MIGRAINES, HX OF 03/28/2007   Past Medical History:  Diagnosis Date  . Acute renal insufficiency 05/31/2014  . Anemia, iron deficiency   . Bilateral lower extremity edema    a. uses  torsemide  . Cervical dysplasia    abnormal paps  . Colon cancer screening 06/14/2014  . Cough 08/08/2012  . Degenerative disk disease 11/19/2011  . Diabetes mellitus type II    Diet controlled  . Diverticulosis   . DJD (degenerative joint disease)   . Drug rash 05/22/2011  . Dry eyes   . Edema   . Elevated liver enzymes 03/21/2012  . Encounter for routine gynecological examination 06/14/2014  . ESOPHAGITIS 03/28/2007   Qualifier: Hospitalized for  By: Marcelino Scot CMA, [place]    . Fibromyalgia   . GASTRITIS 03/28/2007   Qualifier: History of  By: Marcelino Scot CMA, [place] Bilberry    . GERD (gastroesophageal reflux disease)   . Hemorrhoids    external  . HLD (hyperlipidemia)   . HNP (herniated nucleus pulposus) 2/99   T6,7,8 with DJD  . HTN (hypertension)   . Hyperglycemia 05/13/2008   Qualifier: Diagnosis of  By: Glori Bickers MD, Carmell [place]   .  Hypothyroidism   . Interstitial lung disease ([place])    ?  Marland Kitchen Left ovarian cyst    x 3, rupture  . Osteoarthritis    hands  . Osteopenia    mild-11/01; improved 12/05  . Other screening mammogram 08/18/2011  . Palpitations   . PERSONAL HISTORY ALLERGY UNSPEC MEDICINAL AGENT 03/18/2010   Qualifier: Diagnosis of  By: Glori Bickers MD, Carmell Austria   . Raynaud's disease   . Recurrent HSV (herpes simplex virus)    lesions in nose  . Rhinitis 03/21/2012  . Routine general medical examination at a health care facility 03/28/2011   Past Surgical History:  Procedure Laterality Date  . COLONOSCOPY  11/01   Diverticulosis; hemorrhoids  . HAND SURGERY     left thumb  . LASIK     bilateral   Social History   Tobacco Use  . Smoking status: Former Smoker    Types: Cigarettes  . Smokeless tobacco: Never Used  . Tobacco comment: quit over 40 years  Substance Use Topics  . Alcohol use: No    Alcohol/week: 0.0 standard drinks  . Drug use: No   Family History  Problem Relation Age of Onset  . Emphysema Father        + smoker  . Lung cancer Mother        + smoker    . Coronary artery disease Mother        relatively young  . Lymphoma Brother   . Lymphoma Sister   . Diabetes Brother   . Diabetes Sister   . Heart disease Brother   . Anemia Brother        aplactic   . Breast cancer Neg Hx    Allergies  Allergen Reactions  . Keflex [Cephalexin] Anaphylaxis  . Penicillins Anaphylaxis  . Amitriptyline Hcl     REACTION: sedating  . Atorvastatin     REACTION: muscle  . Benicar [Olmesartan Medoxomil]     Muscle pain   . Ceftin [Cefuroxime]     Swelling, "legs turn blue"  . Cetirizine Hcl     REACTION: headache  . Ciprofloxacin     REACTION: ? rash vs sun rxn  . Clonidine Derivatives     Swelling   . Diovan [Valsartan]     Thought it made her feel confused  . Erythromycin     Rash, swollen gums   . Etodolac     REACTION: reaction not known  . Fluoxetine Hcl     REACTION: stomach problems  . Furosemide     REACTION: swelling  . Gabapentin     REACTION: edema  . Naproxen Sodium     REACTION: edema  . Paroxetine     REACTION: weight gain  . Pregabalin     REACTION: swelling  . Sulfonamide Derivatives     Rash, swollen gums, lips  . Tetracycline     REACTION:inflammed genitals  . Venlafaxine     REACTION: sweating  . Cephalexin Hives and Rash   Current Outpatient Medications on File Prior to Visit  Medication Sig Dispense Refill  . buPROPion (WELLBUTRIN XL) 150 MG 24 hr tablet TAKE 1 TABLET BY MOUTH  DAILY 90 tablet 1  . Chlorphen-Phenyleph-ASA (ALKA-SELTZER PLUS COLD PO) Take by mouth as needed.      . cyclobenzaprine (FLEXERIL) 10 MG tablet TAKE ONE-HALF TO ONE TABLET BY MOUTH ONCE DAILY AND ONE AT BEDTIME 180 tablet 1  . Diclofenac Sodium 1.5 % SOLN Place 2 mLs onto  the skin 4 (four) times daily. 3 Bottle 3  . diltiazem (CARDIZEM) 30 MG tablet Take 1 tablet (30 mg total) by mouth 3 (three) times daily as needed. 270 tablet 3  . diphenhydrAMINE (BENADRYL) 25 mg capsule Take 25 mg by mouth as needed.      . DULoxetine  (CYMBALTA) 60 MG capsule TAKE 1 CAPSULE BY MOUTH  DAILY 90 capsule 1  . esomeprazole (NEXIUM) 20 MG capsule Take 1 capsule (20 mg total) by mouth daily. 90 capsule 3  . ferrous sulfate 325 (65 FE) MG EC tablet Take 325 mg by mouth daily with breakfast.    . glucose blood test strip One Touch Ultra stripts blue-To check sugar once daily and as needed for DM2 250.00 100 each 3  . GUAIFENESIN CR PO Take by mouth as needed.      Marland Kitchen HYDROmorphone HCl (EXALGO) 12 MG T24A SR tablet Take 12 mg by mouth daily.    Marland Kitchen HYDROmorphone HCl 16 MG T24A Take 1 tablet by mouth daily.    . hyoscyamine (LEVSIN SL) 0.125 MG SL tablet Take 1 tablet (0.125 mg total) by mouth every 4 (four) hours as needed for cramping. 270 tablet 0  . Magnesium 400 MG CAPS Take by mouth daily.     . metFORMIN (GLUCOPHAGE) 500 MG tablet TAKE 1 TABLET BY MOUTH TWO  TIMES DAILY WITH A MEAL 180 tablet 1  . metoprolol succinate (TOPROL-XL) 50 MG 24 hr tablet Take 1 tablet (50 mg total) by mouth 2 (two) times daily. Take with or immediately following a meal. 180 tablet 3  . oxyCODONE (OXYCONTIN) 10 MG 12 hr tablet Take 10 mg by mouth 3 (three) times daily as needed.     . Probiotic Product (PROBIOTIC PO) Take 1 capsule by mouth daily.    . ramipril (ALTACE) 5 MG capsule TAKE 1 CAPSULE BY MOUTH  DAILY 90 capsule 1  . torsemide (DEMADEX) 20 MG tablet TAKE 1 TABLET BY MOUTH TWO  TIMES DAILY AS NEEDED 180 tablet 0   No current facility-administered medications on file prior to visit.     Review of Systems  Constitutional: Positive for fatigue. Negative for activity change, appetite change, fever and unexpected weight change.  HENT: Negative for congestion, ear pain, rhinorrhea, sinus pressure and sore throat.   Eyes: Negative for pain, redness and visual disturbance.  Respiratory: Negative for cough, shortness of breath and wheezing.   Cardiovascular: Negative for chest pain, palpitations and leg swelling.       Raynaud's - feet     Gastrointestinal: Negative for abdominal pain, blood in stool, constipation and diarrhea.  Endocrine: Negative for polydipsia and polyuria.  Genitourinary: Negative for dysuria, frequency and urgency.  Musculoskeletal: Positive for arthralgias and back pain. Negative for myalgias.  Skin: Negative for pallor and rash.  Allergic/Immunologic: Negative for environmental allergies.  Neurological: Positive for headaches. Negative for dizziness, tremors, seizures, syncope, facial asymmetry, speech difficulty, weakness, light-headedness and numbness.  Hematological: Negative for adenopathy. Does not bruise/bleed easily.  Psychiatric/Behavioral: Negative for decreased concentration and dysphoric mood. The patient is not nervous/anxious.        Mood is stable       Objective:   Physical Exam  Constitutional: She appears well-developed and well-nourished. No distress.  obese and well appearing   HENT:  Head: Normocephalic and atraumatic.  Right Ear: External ear normal.  Left Ear: External ear normal.  Mouth/Throat: Oropharynx is clear and moist.  Eyes: Pupils are equal, round,  and reactive to light. Conjunctivae and EOM are normal. No scleral icterus.  Neck: Normal range of motion. Neck supple. No JVD present. Carotid bruit is not present. No thyromegaly present.  Cardiovascular: Normal rate, regular rhythm, normal heart sounds and intact distal pulses. Exam reveals no gallop.  Pulmonary/Chest: Effort normal and breath sounds normal. No stridor. No respiratory distress. She has no wheezes. She has no rales. She exhibits no tenderness. No breast tenderness, discharge or bleeding.  Abdominal: Soft. Bowel sounds are normal. She exhibits no distension, no abdominal bruit and no mass. There is no tenderness.  Genitourinary: No breast tenderness, discharge or bleeding.  Genitourinary Comments: Breast exam: No mass, nodules, thickening, tenderness, bulging, retraction, inflamation, nipple discharge or  skin changes noted.  No axillary or clavicular LA.      Musculoskeletal: Normal range of motion. She exhibits no edema or tenderness.  Lymphadenopathy:    She has no cervical adenopathy.  Neurological: She is alert. She has normal reflexes. She displays normal reflexes. No cranial nerve deficit. She exhibits normal muscle tone. Coordination normal.  Skin: Skin is warm and dry. No rash noted. No erythema. No pallor.  Solar lentigines diffusely  Some rubor of feet from Raynaud's   Psychiatric: She has a normal mood and affect. Cognition and memory are normal.  Pleasant           Assessment & Plan:   Problem List Items Addressed This Visit      Cardiovascular and Mediastinum   HTN (hypertension)    bp in fair control at this time  BP Readings from Last 1 Encounters:  08/21/18 126/74   No changes needed Most recent labs reviewed  Disc lifstyle change with low sodium diet and exercise          Respiratory   Interstitial lung disease (HCC)    No complaints        Digestive   GERD    Ranitidine not available Change to pepcid 40 mg Update if problems       Relevant Medications   famotidine (PEPCID) 40 MG tablet     Endocrine   DM type 2 (diabetes mellitus, type 2) ([place])    Lab Results  Component Value Date   HGBA1C 6.4 08/14/2018   This is stable and well controlled Disc eye and foot care  Low glycemic diet and wt loss adv F/u 6 mo      Hypothyroidism    Lab Results  Component Value Date   TSH 0.29 (L) 08/14/2018    Dec levothyroxine to 124 mcg daily  Taking correctly  Re check TSH in 6 weeks       Relevant Medications   levothyroxine (SYNTHROID, LEVOTHROID) 125 MCG tablet     Musculoskeletal and Integument   Osteopenia    Due for 1 y dexa  D level good at 96 One fall/no fx Exercise as tolerated          Other   Estrogen deficiency   Relevant Orders   DG Bone Density   Mixed hyperlipidemia    Disc goals for lipids and reasons to control  them Rev last labs with pt Rev low sat fat diet in detail Not at goal Intol of statins  Stressed imp of diet      Obesity    Discussed how this problem influences overall health and the risks it imposes  Reviewed plan for weight loss with lower calorie diet (via better food choices and also portion control  or program like weight watchers) and exercise building up to or more than 30 minutes 5 days per week including some aerobic activity         Routine general medical examination at a health care facility - Primary    Reviewed health habits including diet and exercise and skin cancer prevention Reviewed appropriate screening tests for age  Also reviewed health mt list, fam hx and immunization status , as well as social and family history   See HPI amw reviewed  Labs rev  dexa ordered  utd cologuard for colon screening  Low glycemic diet recommended       Vitamin D deficiency    Level in 90s with supplementation Vitamin D level is therapeutic with current supplementation Disc importance of this to bone and overall health

## 2018-08-21 NOTE — Assessment & Plan Note (Signed)
No complaints

## 2018-08-25 DIAGNOSIS — M961 Postlaminectomy syndrome, not elsewhere classified: Secondary | ICD-10-CM | POA: Diagnosis not present

## 2018-08-25 DIAGNOSIS — M5137 Other intervertebral disc degeneration, lumbosacral region: Secondary | ICD-10-CM | POA: Diagnosis not present

## 2018-08-25 DIAGNOSIS — G894 Chronic pain syndrome: Secondary | ICD-10-CM | POA: Diagnosis not present

## 2018-08-25 DIAGNOSIS — M4725 Other spondylosis with radiculopathy, thoracolumbar region: Secondary | ICD-10-CM | POA: Diagnosis not present

## 2018-08-25 DIAGNOSIS — Z79891 Long term (current) use of opiate analgesic: Secondary | ICD-10-CM | POA: Diagnosis not present

## 2018-09-05 ENCOUNTER — Other Ambulatory Visit: Payer: Self-pay | Admitting: *Deleted

## 2018-09-05 MED ORDER — HYOSCYAMINE SULFATE 0.125 MG SL SUBL: 0.1250 mg | SUBLINGUAL_TABLET | SUBLINGUAL | 0 refills | Status: DC | PRN

## 2018-09-18 ENCOUNTER — Other Ambulatory Visit: Payer: Self-pay

## 2018-09-18 NOTE — Patient Outreach (Signed)
[facility]) Care Management  09/18/2018  Tina Berger December 28, 1950 [id number]   Medication Adherence call to Mrs. Shaneca Orne left a message for patient to call back patient is due on Metformin 500 mg and Ramipril 5 mg. Mrs. Ellegood is showing past due under [facility].   [facility] Direct Dial [phone]  Fax [phone] [email or web address]

## 2018-09-23 ENCOUNTER — Other Ambulatory Visit: Payer: Self-pay | Admitting: Family Medicine

## 2018-09-25 NOTE — Telephone Encounter (Signed)
CPE was on 08/21/18 and pt has next years CPE already scheduled. Last filled on 11/24/17 #180 tabs with 1 additional refills

## 2018-10-03 ENCOUNTER — Other Ambulatory Visit: Payer: Self-pay | Admitting: Family Medicine

## 2018-10-03 ENCOUNTER — Other Ambulatory Visit (INDEPENDENT_AMBULATORY_CARE_PROVIDER_SITE_OTHER): Payer: Medicare Other

## 2018-10-03 DIAGNOSIS — E039 Hypothyroidism, unspecified: Secondary | ICD-10-CM

## 2018-10-03 LAB — TSH: TSH: 8.8 u[IU]/mL — ABNORMAL HIGH (ref 0.35–4.50)

## 2018-10-03 NOTE — Telephone Encounter (Signed)
I need to go back up on her dose of levothyroxine from 125 to 150 mcg  She may have some of the 150s left- unsure  I will pend px for the 150  Re check TSH 6 wk please  Thanks

## 2018-10-06 NOTE — Telephone Encounter (Signed)
Called twice and something may be wrong with pt's phone I can here her pick up call but then it goes mute. I will await her return call

## 2018-10-09 ENCOUNTER — Other Ambulatory Visit: Payer: Self-pay | Admitting: *Deleted

## 2018-10-09 MED ORDER — LEVOTHYROXINE SODIUM 150 MCG PO TABS: 150.0000 ug | ORAL_TABLET | Freq: Every day | ORAL | 1 refills | Status: DC

## 2018-10-09 NOTE — Telephone Encounter (Signed)
Pt notified of lab results and Dr. Tower's comments Rx sent to pharmacy and lab appt scheduled  

## 2018-10-20 DIAGNOSIS — M961 Postlaminectomy syndrome, not elsewhere classified: Secondary | ICD-10-CM | POA: Diagnosis not present

## 2018-10-20 DIAGNOSIS — Z79899 Other long term (current) drug therapy: Secondary | ICD-10-CM | POA: Diagnosis not present

## 2018-10-20 DIAGNOSIS — M5137 Other intervertebral disc degeneration, lumbosacral region: Secondary | ICD-10-CM | POA: Diagnosis not present

## 2018-10-20 DIAGNOSIS — M4725 Other spondylosis with radiculopathy, thoracolumbar region: Secondary | ICD-10-CM | POA: Diagnosis not present

## 2018-10-20 DIAGNOSIS — Z79891 Long term (current) use of opiate analgesic: Secondary | ICD-10-CM | POA: Diagnosis not present

## 2018-10-20 DIAGNOSIS — G894 Chronic pain syndrome: Secondary | ICD-10-CM | POA: Diagnosis not present

## 2018-11-01 ENCOUNTER — Ambulatory Visit
Admission: RE | Admit: 2018-11-01 | Discharge: 2018-11-01 | Disposition: A | Discharge status: Home / Self Care | Payer: Medicare Other | Source: Ambulatory Visit | Attending: Family Medicine | Admitting: Family Medicine

## 2018-11-01 DIAGNOSIS — Z780 Asymptomatic menopausal state: Secondary | ICD-10-CM | POA: Diagnosis not present

## 2018-11-01 DIAGNOSIS — E2839 Other primary ovarian failure: Secondary | ICD-10-CM | POA: Insufficient documentation

## 2018-11-01 DIAGNOSIS — M85852 Other specified disorders of bone density and structure, left thigh: Secondary | ICD-10-CM | POA: Diagnosis not present

## 2018-11-13 ENCOUNTER — Other Ambulatory Visit: Payer: Self-pay | Admitting: Family Medicine

## 2018-11-13 NOTE — Telephone Encounter (Signed)
Spoke with patient. She does have plenty of levothyroxine medication available. Will not refill at this time. Patient coming in for labs on 11/21/2018. Await levels.

## 2018-11-13 NOTE — Telephone Encounter (Signed)
Dose was changed in December 2019 and patient has an appointment for labs only to re check levels on 11/23/2018. Left message for patient to call back. Does she have enough tablets to last till her levels are re checked? In case we need to change dose

## 2018-11-19 ENCOUNTER — Telehealth: Payer: Self-pay | Admitting: Family Medicine

## 2018-11-19 DIAGNOSIS — E039 Hypothyroidism, unspecified: Secondary | ICD-10-CM

## 2018-11-19 NOTE — Telephone Encounter (Signed)
-----   Message from Lendon Collar, RT sent at 11/14/2018 12:39 PM EST ----- Regarding: Lab orders for Tuesday 11/21/18 Please enter lab orders for 11/21/18. Schedule states that patient is coming in for TSH. Thanks!

## 2018-11-21 ENCOUNTER — Other Ambulatory Visit (INDEPENDENT_AMBULATORY_CARE_PROVIDER_SITE_OTHER): Payer: Medicare Other

## 2018-11-21 DIAGNOSIS — E039 Hypothyroidism, unspecified: Secondary | ICD-10-CM

## 2018-11-21 LAB — TSH: TSH: 0.62 u[IU]/mL (ref 0.35–4.50)

## 2018-11-24 ENCOUNTER — Other Ambulatory Visit: Payer: Self-pay | Admitting: Cardiovascular Disease

## 2018-11-24 ENCOUNTER — Encounter: Payer: Self-pay | Admitting: Family Medicine

## 2018-12-22 DIAGNOSIS — M4725 Other spondylosis with radiculopathy, thoracolumbar region: Secondary | ICD-10-CM | POA: Diagnosis not present

## 2018-12-22 DIAGNOSIS — Z79891 Long term (current) use of opiate analgesic: Secondary | ICD-10-CM | POA: Diagnosis not present

## 2018-12-22 DIAGNOSIS — M961 Postlaminectomy syndrome, not elsewhere classified: Secondary | ICD-10-CM | POA: Diagnosis not present

## 2018-12-22 DIAGNOSIS — M5137 Other intervertebral disc degeneration, lumbosacral region: Secondary | ICD-10-CM | POA: Diagnosis not present

## 2018-12-22 DIAGNOSIS — G894 Chronic pain syndrome: Secondary | ICD-10-CM | POA: Diagnosis not present

## 2019-01-01 ENCOUNTER — Other Ambulatory Visit: Payer: Self-pay | Admitting: Family Medicine

## 2019-01-12 ENCOUNTER — Other Ambulatory Visit: Payer: Self-pay | Admitting: Family Medicine

## 2019-02-27 ENCOUNTER — Other Ambulatory Visit: Payer: Self-pay | Admitting: Family Medicine

## 2019-02-27 ENCOUNTER — Other Ambulatory Visit: Payer: Self-pay

## 2019-02-27 NOTE — Patient Outreach (Signed)
[facility]) Care Management  02/27/2019  Tina Berger [birth year] [id number]   Medication Adherence call to Mrs. Lowanda Foster Hippa Identifiers Verify spoke with patient she is due on Metformin 500 mg and Ramipril 5 mg patient will order this medication on line from [facility]  patient is taking 1 tablet daily on ramipril and on Metformin she is taking 1 tablet 2 times a day. Mrs. Martinezgarcia is showing past due under [place].   [facility] Management Direct Dial [phone]  Fax [phone] [email or web address]

## 2019-03-02 DIAGNOSIS — Z79891 Long term (current) use of opiate analgesic: Secondary | ICD-10-CM | POA: Diagnosis not present

## 2019-03-02 DIAGNOSIS — Z79899 Other long term (current) drug therapy: Secondary | ICD-10-CM | POA: Diagnosis not present

## 2019-03-02 DIAGNOSIS — M961 Postlaminectomy syndrome, not elsewhere classified: Secondary | ICD-10-CM | POA: Diagnosis not present

## 2019-03-02 DIAGNOSIS — G894 Chronic pain syndrome: Secondary | ICD-10-CM | POA: Diagnosis not present

## 2019-03-02 DIAGNOSIS — M4725 Other spondylosis with radiculopathy, thoracolumbar region: Secondary | ICD-10-CM | POA: Diagnosis not present

## 2019-03-02 DIAGNOSIS — M5137 Other intervertebral disc degeneration, lumbosacral region: Secondary | ICD-10-CM | POA: Diagnosis not present

## 2019-04-30 ENCOUNTER — Other Ambulatory Visit: Payer: Self-pay | Admitting: Cardiovascular Disease

## 2019-05-10 DIAGNOSIS — Z79891 Long term (current) use of opiate analgesic: Secondary | ICD-10-CM | POA: Diagnosis not present

## 2019-05-10 DIAGNOSIS — G894 Chronic pain syndrome: Secondary | ICD-10-CM | POA: Diagnosis not present

## 2019-05-10 DIAGNOSIS — M961 Postlaminectomy syndrome, not elsewhere classified: Secondary | ICD-10-CM | POA: Diagnosis not present

## 2019-05-10 DIAGNOSIS — M5137 Other intervertebral disc degeneration, lumbosacral region: Secondary | ICD-10-CM | POA: Diagnosis not present

## 2019-05-10 DIAGNOSIS — M4725 Other spondylosis with radiculopathy, thoracolumbar region: Secondary | ICD-10-CM | POA: Diagnosis not present

## 2019-06-06 ENCOUNTER — Other Ambulatory Visit: Payer: Self-pay

## 2019-06-06 NOTE — Patient Outreach (Signed)
[facility]) Care Management  06/06/2019  Tina Berger [birth year] [id number]   Medication Adherence call to Tina. Tina Berger Hippa Identifiers Verify spoke with patient she is past due on Ramipril 5 mg and Metformin 500 mg patient explain she takes on a regular basis but at some point she end up with extras  Patient has medication at this time and will order when finished.Tina Berger is showing past due under [place].  [facility] Management Direct Dial [phone]  Fax [phone] [email or web address]

## 2019-06-20 ENCOUNTER — Other Ambulatory Visit: Payer: Self-pay

## 2019-06-20 NOTE — Patient Outreach (Signed)
[facility]) Care Management  06/20/2019  Tina Berger 27-Nov-1950 [id number]   Medication Adherence call to Tina Berger Voice message left with a call back number.Tina Berger is showing past due on Ramipril 5 mg and Metformin 500 mg under [place].   [facility] Management Direct Dial [phone]  Fax [phone] [email or web address]

## 2019-07-02 ENCOUNTER — Other Ambulatory Visit: Payer: Self-pay | Admitting: Family Medicine

## 2019-07-02 ENCOUNTER — Other Ambulatory Visit: Payer: Self-pay | Admitting: Cardiovascular Disease

## 2019-07-02 NOTE — Telephone Encounter (Signed)
Spoke with Tina Berger regarding her medication refills. I told Ms. Waide that she will need to be seen by Dr. [name] at least once a year in order for Tina Berger to continue to refill her cardiac meds. The patient discussed that at her last office visit, Dr. [name] told her to follow up on as needed basis and that he would send a note to the PCP to have them refill medications. The patient is aware to contact our office if has any further problems with her heart. The patient understands that we will send in enough refills to get her to her PCP follow up which is in Oct. 2020.

## 2019-07-04 NOTE — Telephone Encounter (Signed)
CPE scheduled 08/24/19 flexeril last filled on 09/25/18 #180 tabs with 1 refill

## 2019-07-20 ENCOUNTER — Other Ambulatory Visit: Payer: Self-pay | Admitting: *Deleted

## 2019-07-20 MED ORDER — HYOSCYAMINE SULFATE 0.125 MG SL SUBL: 0.1250 mg | SUBLINGUAL_TABLET | SUBLINGUAL | 0 refills | Status: DC | PRN

## 2019-07-23 ENCOUNTER — Other Ambulatory Visit: Payer: Self-pay | Admitting: Family Medicine

## 2019-07-26 ENCOUNTER — Other Ambulatory Visit: Payer: Self-pay | Admitting: Family Medicine

## 2019-08-02 DIAGNOSIS — Z79891 Long term (current) use of opiate analgesic: Secondary | ICD-10-CM | POA: Diagnosis not present

## 2019-08-02 DIAGNOSIS — M5137 Other intervertebral disc degeneration, lumbosacral region: Secondary | ICD-10-CM | POA: Diagnosis not present

## 2019-08-02 DIAGNOSIS — Z79899 Other long term (current) drug therapy: Secondary | ICD-10-CM | POA: Diagnosis not present

## 2019-08-02 DIAGNOSIS — M4725 Other spondylosis with radiculopathy, thoracolumbar region: Secondary | ICD-10-CM | POA: Diagnosis not present

## 2019-08-02 DIAGNOSIS — M961 Postlaminectomy syndrome, not elsewhere classified: Secondary | ICD-10-CM | POA: Diagnosis not present

## 2019-08-02 DIAGNOSIS — G894 Chronic pain syndrome: Secondary | ICD-10-CM | POA: Diagnosis not present

## 2019-08-08 DIAGNOSIS — L814 Other melanin hyperpigmentation: Secondary | ICD-10-CM | POA: Diagnosis not present

## 2019-08-08 DIAGNOSIS — L739 Follicular disorder, unspecified: Secondary | ICD-10-CM | POA: Diagnosis not present

## 2019-08-08 DIAGNOSIS — L719 Rosacea, unspecified: Secondary | ICD-10-CM | POA: Diagnosis not present

## 2019-08-16 ENCOUNTER — Telehealth: Payer: Self-pay | Admitting: Family Medicine

## 2019-08-16 ENCOUNTER — Ambulatory Visit: Payer: Medicare Other

## 2019-08-16 ENCOUNTER — Ambulatory Visit (INDEPENDENT_AMBULATORY_CARE_PROVIDER_SITE_OTHER): Payer: Medicare Other

## 2019-08-16 DIAGNOSIS — D508 Other iron deficiency anemias: Secondary | ICD-10-CM

## 2019-08-16 DIAGNOSIS — I10 Essential (primary) hypertension: Secondary | ICD-10-CM

## 2019-08-16 DIAGNOSIS — E119 Type 2 diabetes mellitus without complications: Secondary | ICD-10-CM

## 2019-08-16 DIAGNOSIS — E559 Vitamin D deficiency, unspecified: Secondary | ICD-10-CM

## 2019-08-16 DIAGNOSIS — Z0000 Encounter for general adult medical examination without abnormal findings: Secondary | ICD-10-CM

## 2019-08-16 DIAGNOSIS — E782 Mixed hyperlipidemia: Secondary | ICD-10-CM

## 2019-08-16 DIAGNOSIS — E039 Hypothyroidism, unspecified: Secondary | ICD-10-CM

## 2019-08-16 NOTE — Patient Instructions (Signed)
Tina Berger , Thank you for taking time to come for your Medicare Wellness Visit. I appreciate your ongoing commitment to your health goals. Please review the following plan we discussed and let me know if I can assist you in the future.   Screening recommendations/referrals: Colonoscopy: up to date, cologuard completed 09/03/2016 Mammogram: up to date, completed 11/01/2018 Bone Density: up to date, completed 11/01/2018 Recommended yearly ophthalmology/optometry visit for glaucoma screening and checkup Recommended yearly dental visit for hygiene and checkup  Vaccinations: Influenza vaccine: up to date, completed 07/03/2019 Pneumococcal vaccine: Completed series Tdap vaccine: up to date, completed 06/14/2014 Shingles vaccine: will check with pharmacy    Advanced directives: Please bring a copy of your POA (Power of [place]) and/or Living Will to your next appointment.   Conditions/risks identified: diabetes, hypertension, hyperlipidemia  Next appointment: 08/24/2019 @ 9:30 am    Preventive Care 85 Years and Older, Female Preventive care refers to lifestyle choices and visits with your health care provider that can promote health and wellness. What does preventive care include?  A yearly physical exam. This is also called an annual well check.  Dental exams once or twice a year.  Routine eye exams. Ask your health care provider how often you should have your eyes checked.  Personal lifestyle choices, including:  Daily care of your teeth and gums.  Regular physical activity.  Eating a healthy diet.  Avoiding tobacco and drug use.  Limiting alcohol use.  Practicing safe sex.  Taking low-dose aspirin every day.  Taking vitamin and mineral supplements as recommended by your health care provider. What happens during an annual well check? The services and screenings done by your health care provider during your annual well check will depend on your age, overall health, lifestyle  risk factors, and family history of disease. Counseling  Your health care provider may ask you questions about your:  Alcohol use.  Tobacco use.  Drug use.  Emotional well-being.  Home and relationship well-being.  Sexual activity.  Eating habits.  History of falls.  Memory and ability to understand (cognition).  Work and work Statistician.  Reproductive health. Screening  You may have the following tests or measurements:  Height, weight, and BMI.  Blood pressure.  Lipid and cholesterol levels. These may be checked every 5 years, or more frequently if you are over 106 years old.  Skin check.  Lung cancer screening. You may have this screening every year starting at age 65 if you have a 30-pack-year history of smoking and currently smoke or have quit within the past 15 years.  Fecal occult blood test (FOBT) of the stool. You may have this test every year starting at age 66.  Flexible sigmoidoscopy or colonoscopy. You may have a sigmoidoscopy every 5 years or a colonoscopy every 10 years starting at age 40.  Hepatitis C blood test.  Hepatitis B blood test.  Sexually transmitted disease (STD) testing.  Diabetes screening. This is done by checking your blood sugar (glucose) after you have not eaten for a while (fasting). You may have this done every 1-3 years.  Bone density scan. This is done to screen for osteoporosis. You may have this done starting at age 42.  Mammogram. This may be done every 1-2 years. Talk to your health care provider about how often you should have regular mammograms. Talk with your health care provider about your test results, treatment options, and if necessary, the need for more tests. Vaccines  Your health care provider may  recommend certain vaccines, such as:  Influenza vaccine. This is recommended every year.  Tetanus, diphtheria, and acellular pertussis (Tdap, Td) vaccine. You may need a Td booster every 10 years.  Zoster vaccine.  You may need this after age 25.  Pneumococcal 13-valent conjugate (PCV13) vaccine. One dose is recommended after age 17.  Pneumococcal polysaccharide (PPSV23) vaccine. One dose is recommended after age 63. Talk to your health care provider about which screenings and vaccines you need and how often you need them. This information is not intended to replace advice given to you by your health care provider. Make sure you discuss any questions you have with your health care provider. Document Released: 10/31/2015 Document Revised: 06/23/2016 Document Reviewed: 08/05/2015 Elsevier Interactive Patient Education  2017 [place] Prevention in the Home Falls can cause injuries. They can happen to people of all ages. There are many things you can do to make your home safe and to help prevent falls. What can I do on the outside of my home?  Regularly fix the edges of walkways and driveways and fix any cracks.  Remove anything that might make you trip as you walk through a door, such as a raised step or threshold.  Trim any bushes or trees on the path to your home.  Use bright outdoor lighting.  Clear any walking paths of anything that might make someone trip, such as rocks or tools.  Regularly check to see if handrails are loose or broken. Make sure that both sides of any steps have handrails.  Any raised decks and porches should have guardrails on the edges.  Have any leaves, snow, or ice cleared regularly.  Use sand or salt on walking paths during winter.  Clean up any spills in your garage right away. This includes oil or grease spills. What can I do in the bathroom?  Use night lights.  Install grab bars by the toilet and in the tub and shower. Do not use towel bars as grab bars.  Use non-skid mats or decals in the tub or shower.  If you need to sit down in the shower, use a plastic, non-slip stool.  Keep the floor dry. Clean up any water that spills on the floor as soon  as it happens.  Remove soap buildup in the tub or shower regularly.  Attach bath mats securely with double-sided non-slip rug tape.  Do not have throw rugs and other things on the floor that can make you trip. What can I do in the bedroom?  Use night lights.  Make sure that you have a light by your bed that is easy to reach.  Do not use any sheets or blankets that are too big for your bed. They should not hang down onto the floor.  Have a firm chair that has side arms. You can use this for support while you get dressed.  Do not have throw rugs and other things on the floor that can make you trip. What can I do in the kitchen?  Clean up any spills right away.  Avoid walking on wet floors.  Keep items that you use a lot in easy-to-reach places.  If you need to reach something above you, use a strong step stool that has a grab bar.  Keep electrical cords out of the way.  Do not use floor polish or wax that makes floors slippery. If you must use wax, use non-skid floor wax.  Do not have throw rugs and  other things on the floor that can make you trip. What can I do with my stairs?  Do not leave any items on the stairs.  Make sure that there are handrails on both sides of the stairs and use them. Fix handrails that are broken or loose. Make sure that handrails are as long as the stairways.  Check any carpeting to make sure that it is firmly attached to the stairs. Fix any carpet that is loose or worn.  Avoid having throw rugs at the top or bottom of the stairs. If you do have throw rugs, attach them to the floor with carpet tape.  Make sure that you have a light switch at the top of the stairs and the bottom of the stairs. If you do not have them, ask someone to add them for you. What else can I do to help prevent falls?  Wear shoes that:  Do not have high heels.  Have rubber bottoms.  Are comfortable and fit you well.  Are closed at the toe. Do not wear sandals.  If  you use a stepladder:  Make sure that it is fully opened. Do not climb a closed stepladder.  Make sure that both sides of the stepladder are locked into place.  Ask someone to hold it for you, if possible.  Clearly mark and make sure that you can see:  Any grab bars or handrails.  First and last steps.  Where the edge of each step is.  Use tools that help you move around (mobility aids) if they are needed. These include:  Canes.  Walkers.  Scooters.  Crutches.  Turn on the lights when you go into a dark area. Replace any light bulbs as soon as they burn out.  Set up your furniture so you have a clear path. Avoid moving your furniture around.  If any of your floors are uneven, fix them.  If there are any pets around you, be aware of where they are.  Review your medicines with your doctor. Some medicines can make you feel dizzy. This can increase your chance of falling. Ask your doctor what other things that you can do to help prevent falls. This information is not intended to replace advice given to you by your health care provider. Make sure you discuss any questions you have with your health care provider. Document Released: 07/31/2009 Document Revised: 03/11/2016 Document Reviewed: 11/08/2014 Elsevier Interactive Patient Education  2017 [employer].

## 2019-08-16 NOTE — Telephone Encounter (Signed)
-----   Message from Cloyd Stagers, RT sent at 08/09/2019  2:24 PM EDT ----- Regarding: Lab Orders for Friday 10.30.2020 Please place lab orders for Friday 10.30.2020, office visit for physical on Friday 11.6.2020 Thank you, Dyke Maes RT(R)

## 2019-08-16 NOTE — Progress Notes (Signed)
PCP notes:  Health Maintenance: will check with pharmacy about Shingrix, eye exam scheduled for next month    Abnormal Screenings: none    Patient concerns: none    Nurse concerns: none    Next PCP appt.: 08/24/2019 @ 9:30 am

## 2019-08-16 NOTE — Progress Notes (Signed)
Subjective:   Tina Berger is a 68 y.o. female who presents for Medicare Annual (Subsequent) preventive examination.  Review of Systems:    This visit is being conducted through telemedicine via telephone at the nurse health advisor's home address due to the COVID-19 pandemic. This patient has given me verbal consent via doximity to conduct this visit, patient states they are participating from their home address. Patient and myself are on the telephone call. There is no referral for this visit. Some vital signs may be absent or patient reported.    Patient identification: identified by name, DOB, and current address   Cardiac Risk Factors include: advanced age (>36men, >2 women);diabetes mellitus;hypertension;dyslipidemia     Objective:     Vitals: There were no vitals taken for this visit.  There is no height or weight on file to calculate BMI.  Advanced Directives 08/16/2019 08/14/2018 10/24/2017 08/10/2017 07/26/2016  Does Patient Have a Medical Advance Directive? Yes Yes Yes Yes Yes  Type of Paramedic of [place];Living will [place];Living will Living will [place];Living will [place]  Does patient want to make changes to medical advance directive? - - - - No - Patient declined  Copy of [place] in Chart? No - copy requested No - copy requested - No - copy requested No - copy requested    Tobacco Social History   Tobacco Use  Smoking Status Former Smoker  . Types: Cigarettes  Smokeless Tobacco Never Used  Tobacco Comment   quit over 40 years     Counseling given: Not Answered Comment: quit over 40 years   Clinical Intake:  Pre-visit preparation completed: Yes  Pain : No/denies pain     Nutritional Risks: None Diabetes: Yes CBG done?: No Did pt. bring in CBG monitor from home?: No  How often do you need to have someone help you when you read  instructions, pamphlets, or other written materials from your doctor or pharmacy?: 1 - Never What is the last grade level you completed in school?: some college  Interpreter Needed?: No  Information entered by :: CJohnson, LPN  Past Medical History:  Diagnosis Date  . Acute renal insufficiency 05/31/2014  . Anemia, iron deficiency   . Bilateral lower extremity edema    a. uses torsemide  . Cervical dysplasia    abnormal paps  . Colon cancer screening 06/14/2014  . Cough 08/08/2012  . Degenerative disk disease 11/19/2011  . Diabetes mellitus type II    Diet controlled  . Diverticulosis   . DJD (degenerative joint disease)   . Drug rash 05/22/2011  . Dry eyes   . Edema   . Elevated liver enzymes 03/21/2012  . Encounter for routine gynecological examination 06/14/2014  . ESOPHAGITIS 03/28/2007   Qualifier: Hospitalized for  By: Marcelino Scot CMA, [place]    . Fibromyalgia   . GASTRITIS 03/28/2007   Qualifier: History of  By: Marcelino Scot CMA, [place] Bilberry    . GERD (gastroesophageal reflux disease)   . Hemorrhoids    external  . HLD (hyperlipidemia)   . HNP (herniated nucleus pulposus) 2/99   T6,7,8 with DJD  . HTN (hypertension)   . Hyperglycemia 05/13/2008   Qualifier: Diagnosis of  By: Glori Bickers MD, Carmell [place]   . Hypothyroidism   . Interstitial lung disease ([place])    ?  Marland Kitchen Left ovarian cyst    x 3, rupture  . Osteoarthritis  hands  . Osteopenia    mild-11/01; improved 12/05  . Other screening mammogram 08/18/2011  . Palpitations   . PERSONAL HISTORY ALLERGY UNSPEC MEDICINAL AGENT 03/18/2010   Qualifier: Diagnosis of  By: Glori Bickers MD, Carmell Austria   . Raynaud's disease   . Recurrent HSV (herpes simplex virus)    lesions in nose  . Rhinitis 03/21/2012  . Routine general medical examination at a health care facility 03/28/2011   Past Surgical History:  Procedure Laterality Date  . COLONOSCOPY  11/01   Diverticulosis; hemorrhoids  . HAND SURGERY     left thumb  . LASIK     bilateral    Family History  Problem Relation Age of Onset  . Emphysema Father        + smoker  . Lung cancer Mother        + smoker  . Coronary artery disease Mother        relatively young  . Lymphoma Brother   . Lymphoma Sister   . Diabetes Brother   . Diabetes Sister   . Heart disease Brother   . Anemia Brother        aplactic   . Breast cancer Neg Hx    Social History   Socioeconomic History  . Marital status: Married    Spouse name: Not on file  . Number of children: 1  . Years of education: Not on file  . Highest education level: Not on file  Occupational History  . 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 Financial resource strain: Not hard at all  . Food insecurity    Worry: Never true    Inability: Never true  . Transportation needs    Medical: No    Non-medical: No  Tobacco Use  . Smoking status: Former Smoker    Types: Cigarettes  . Smokeless tobacco: Never Used  . Tobacco comment: quit over 40 years  Substance and Sexual Activity  . Alcohol use: No    Alcohol/week: 0.0 standard drinks  . Drug use: No  . Sexual activity: Yes  Lifestyle  . Physical activity    Days per week: 0 days    Minutes per session: 0 min  . Stress: Not at all  Relationships  . Social Herbalist on phone: Not on file    Gets together: Not on file    Attends religious service: Not on file    Active member of club or organization: Not on file    Attends meetings of clubs or organizations: Not on file    Relationship status: Not on file  Other Topics Concern  . Not on file  Social History Narrative   Married      1 child      Network engineer to dean at [employer]      Does regularly exercise      Daily caffeine use: 2/day          Outpatient Encounter Medications as of 08/16/2019  Medication Sig  . buPROPion (WELLBUTRIN XL) 150 MG 24 hr tablet TAKE 1 TABLET BY MOUTH  DAILY  . Chlorphen-Phenyleph-ASA (ALKA-SELTZER PLUS COLD PO)  Take by mouth as needed.    . cyclobenzaprine (FLEXERIL) 10 MG tablet TAKE 1/2 TO 1 TABLET BY  MOUTH ONCE DAILY AND 1  TABLET AT BEDTIME  . Diclofenac Sodium 1.5 % SOLN Place 2 mLs onto the skin 4 (four) times daily.  Marland Kitchen  diltiazem (CARDIZEM) 30 MG tablet Take 1 tablet (30 mg total) by mouth 3 (three) times daily as needed.  . diphenhydrAMINE (BENADRYL) 25 mg capsule Take 25 mg by mouth as needed.    . DULoxetine (CYMBALTA) 60 MG capsule TAKE 1 CAPSULE BY MOUTH  DAILY  . esomeprazole (NEXIUM) 20 MG capsule TAKE 1 CAPSULE BY MOUTH  DAILY  . famotidine (PEPCID) 40 MG tablet TAKE 1 TABLET BY MOUTH  DAILY  . ferrous sulfate 325 (65 FE) MG EC tablet Take 325 mg by mouth daily with breakfast.  . glucose blood test strip One Touch Ultra stripts blue-To check sugar once daily and as needed for DM2 250.00  . GUAIFENESIN CR PO Take by mouth as needed.    Marland Kitchen HYDROmorphone HCl (EXALGO) 12 MG T24A SR tablet Take 12 mg by mouth daily.  Marland Kitchen HYDROmorphone HCl 16 MG T24A Take 1 tablet by mouth daily.  . hyoscyamine (LEVSIN SL) 0.125 MG SL tablet Take 1 tablet (0.125 mg total) by mouth every 4 (four) hours as needed for cramping.  Marland Kitchen levothyroxine (SYNTHROID) 150 MCG tablet TAKE 1 TABLET BY MOUTH EVERY DAY  . Magnesium 400 MG CAPS Take by mouth daily.   . metFORMIN (GLUCOPHAGE) 500 MG tablet TAKE 1 TABLET BY MOUTH TWO  TIMES DAILY WITH A MEAL  . metoprolol succinate (TOPROL-XL) 50 MG 24 hr tablet TAKE 1 TABLET BY MOUTH 2  TIMES DAILY. TAKE WITH OR  IMMEDIATELY FOLLOWING A  MEAL.  Marland Kitchen oxyCODONE (OXYCONTIN) 10 MG 12 hr tablet Take 10 mg by mouth 3 (three) times daily as needed.   . Probiotic Product (PROBIOTIC PO) Take 1 capsule by mouth daily.  . ramipril (ALTACE) 5 MG capsule TAKE 1 CAPSULE BY MOUTH  DAILY  . torsemide (DEMADEX) 20 MG tablet TAKE 1 TABLET BY MOUTH TWO  TIMES DAILY AS NEEDED   No facility-administered encounter medications on file as of 08/16/2019.     Activities of Daily Living In your present  state of health, do you have any difficulty performing the following activities: 08/16/2019  Hearing? N  Vision? N  Difficulty concentrating or making decisions? N  Walking or climbing stairs? N  Dressing or bathing? N  Doing errands, shopping? N  Preparing Food and eating ? N  Using the Toilet? N  In the past six months, have you accidently leaked urine? N  Do you have problems with loss of bowel control? N  Managing your Medications? N  Managing your Finances? N  Housekeeping or managing your Housekeeping? N  Some recent data might be hidden    Patient Care Team: Tower, [name], MD as PCP - General [name], [name], MD as Consulting Physician (Cardiology)    Assessment:   This is a routine wellness examination for [place].  Exercise Activities and Dietary recommendations Current Exercise Habits: Home exercise routine, Type of exercise: walking, Time (Minutes): 30, Frequency (Times/Week): 3, Weekly Exercise (Minutes/Week): 90, Intensity: Mild, Exercise limited by: None identified  Goals    . Increase physical activity     Starting 08/14/2018, I will continue to exercise at least 30 minutes 3-4 days per week.     . Patient Stated     08/16/2019, I will continue to exercise and improve my strength training.        Fall Risk Fall Risk  08/16/2019 08/14/2018 11/09/2017 10/24/2017 08/10/2017  Falls in the past year? 0 No No No No  Number falls in past yr: 0 - - - -  Injury with Fall? 0 - - - -  Risk for fall due to : Medication side effect - - - -  Follow up Falls evaluation completed;Falls prevention discussed - - - -   Is the patient's home free of loose throw rugs in walkways, pet beds, electrical cords, etc?   yes      Grab bars in the bathroom? no      Handrails on the stairs?   yes      Adequate lighting?   yes  Timed Get Up and Go performed: N/A  Depression Screen PHQ 2/9 Scores 08/16/2019 08/14/2018 11/09/2017 10/24/2017  PHQ - 2 Score 0 0 0 0  PHQ- 9 Score 0 0 -  -     Cognitive Function MMSE - Mini Mental State Exam 08/16/2019 08/14/2018 08/10/2017 07/26/2016 07/26/2016  Orientation to time 5 5 5  - 5  Orientation to Place 5 5 5  - 5  Registration 3 3 3  - 3  Attention/ Calculation 5 0 0 - 0  Recall 3 3 3  (No Data) 1  Recall-comments - - - pt was unable to recall 2 of 3 words -  Language- name 2 objects - 0 0 - 0  Language- repeat 1 1 1  - 1  Language- follow 3 step command - 3 3 - 3  Language- read & follow direction - 0 0 - 0  Write a sentence - 0 0 - 0  Copy design - 0 0 - 0  Total score - 20 20 - 18  Mini Cog  Mini-Cog screen was completed. Maximum score is 22. A value of 0 denotes this part of the MMSE was not completed or the patient failed this part of the Mini-Cog screening.      Immunization History  Administered Date(s) Administered  . Influenza Split 06/19/2011  . Influenza Whole 08/01/2009  . Influenza, High Dose Seasonal PF 08/14/2018, 07/03/2019  . Influenza,inj,Quad PF,6+ Mos 06/14/2014, 08/25/2015, 07/26/2016, 08/19/2017  . Pneumococcal Conjugate-13 08/25/2015  . Pneumococcal Polysaccharide-23 08/25/2016  . Td 11/25/2003  . Tdap 06/14/2014  . Zoster 09/16/2014    Qualifies for Shingles Vaccine? Yes  Screening Tests Health Maintenance  Topic Date Due  . FOOT EXAM  08/19/2018  . OPHTHALMOLOGY EXAM  08/29/2018  . HEMOGLOBIN A1C  02/13/2019  . Fecal DNA (Cologuard)  09/04/2019  . MAMMOGRAM  11/02/2019  . TETANUS/TDAP  06/14/2024  . INFLUENZA VACCINE  Completed  . DEXA SCAN  Completed  . Hepatitis C Screening  Completed  . PNA vac Low Risk Adult  Completed    Cancer Screenings: Lung: Low Dose CT Chest recommended if Age 54-80 years, 30 pack-year currently smoking OR have quit w/in 15years. Patient does not qualify. Breast:  Up to date on Mammogram? Yes, completed 11/01/2018   Up to date of Bone Density/Dexa? Yes, completed 11/01/2018 Colorectal: Cologuard completed 09/03/2016  Additional Screenings:  Hepatitis  C Screening: 10/19/2011     Plan:    Patient will continue exercising and work on Editor, commissioning.    I have personally reviewed and noted the following in the patient's chart:   . Medical and social history . Use of alcohol, tobacco or illicit drugs  . Current medications and supplements . Functional ability and status . Nutritional status . Physical activity . Advanced directives . List of other physicians . Hospitalizations, surgeries, and ER visits in previous 12 months . Vitals . Screenings to include cognitive, depression, and falls . Referrals and appointments  In addition, I  have reviewed and discussed with patient certain preventive protocols, quality metrics, and best practice recommendations. A written personalized care plan for preventive services as well as general preventive health recommendations were provided to patient.     Andrez Grime, LPN  [phone]

## 2019-08-17 ENCOUNTER — Other Ambulatory Visit (INDEPENDENT_AMBULATORY_CARE_PROVIDER_SITE_OTHER): Payer: Medicare Other

## 2019-08-17 DIAGNOSIS — E559 Vitamin D deficiency, unspecified: Secondary | ICD-10-CM

## 2019-08-17 DIAGNOSIS — E782 Mixed hyperlipidemia: Secondary | ICD-10-CM

## 2019-08-17 DIAGNOSIS — E039 Hypothyroidism, unspecified: Secondary | ICD-10-CM

## 2019-08-17 DIAGNOSIS — I10 Essential (primary) hypertension: Secondary | ICD-10-CM | POA: Diagnosis not present

## 2019-08-17 DIAGNOSIS — E119 Type 2 diabetes mellitus without complications: Secondary | ICD-10-CM

## 2019-08-17 LAB — CBC WITH DIFFERENTIAL/PLATELET
Basophils Absolute: 0.1 10*3/uL (ref 0.0–0.1)
Basophils Relative: 1.1 % (ref 0.0–3.0)
Eosinophils Absolute: 0.3 10*3/uL (ref 0.0–0.7)
Eosinophils Relative: 4.7 % (ref 0.0–5.0)
HCT: 41.6 % (ref 36.0–46.0)
Hemoglobin: 13.4 g/dL (ref 12.0–15.0)
Lymphocytes Relative: 33.3 % (ref 12.0–46.0)
Lymphs Abs: 2.1 10*3/uL (ref 0.7–4.0)
MCHC: 32.3 g/dL (ref 30.0–36.0)
MCV: 90.3 fl (ref 78.0–100.0)
Monocytes Absolute: 0.7 10*3/uL (ref 0.1–1.0)
Monocytes Relative: 11.1 % (ref 3.0–12.0)
Neutro Abs: 3.1 10*3/uL (ref 1.4–7.7)
Neutrophils Relative %: 49.8 % (ref 43.0–77.0)
Platelets: 328 10*3/uL (ref 150.0–400.0)
RBC: 4.61 Mil/uL (ref 3.87–5.11)
RDW: 13.2 % (ref 11.5–15.5)
WBC: 6.2 10*3/uL (ref 4.0–10.5)

## 2019-08-17 LAB — COMPREHENSIVE METABOLIC PANEL
ALT: 12 U/L (ref 0–35)
AST: 14 U/L (ref 0–37)
Albumin: 4.2 g/dL (ref 3.5–5.2)
Alkaline Phosphatase: 78 U/L (ref 39–117)
BUN: 23 mg/dL (ref 6–23)
CO2: 34 mEq/L — ABNORMAL HIGH (ref 19–32)
Calcium: 9.1 mg/dL (ref 8.4–10.5)
Chloride: 100 mEq/L (ref 96–112)
Creatinine, Ser: 0.96 mg/dL (ref 0.40–1.20)
GFR: 57.54 mL/min — ABNORMAL LOW (ref >60.00)
Glucose, Bld: 134 mg/dL — ABNORMAL HIGH (ref 70–99)
Potassium: 4.7 mEq/L (ref 3.5–5.1)
Sodium: 141 mEq/L (ref 135–145)
Total Bilirubin: 0.4 mg/dL (ref 0.2–1.2)
Total Protein: 6.7 g/dL (ref 6.0–8.3)

## 2019-08-17 LAB — LIPID PANEL
Cholesterol: 194 mg/dL (ref 0–200)
HDL: 40 mg/dL (ref >39.00)
LDL Cholesterol: 127 mg/dL — ABNORMAL HIGH (ref 0–99)
NonHDL: 154.4
Total CHOL/HDL Ratio: 5
Triglycerides: 139 mg/dL (ref 0.0–149.0)
VLDL: 27.8 mg/dL (ref 0.0–40.0)

## 2019-08-17 LAB — VITAMIN D 25 HYDROXY (VIT D DEFICIENCY, FRACTURES): VITD: 50.96 ng/mL (ref 30.00–100.00)

## 2019-08-17 LAB — TSH: TSH: 0.57 u[IU]/mL (ref 0.35–4.50)

## 2019-08-17 LAB — HEMOGLOBIN A1C: Hgb A1c MFr Bld: 6.5 % (ref 4.6–6.5)

## 2019-08-20 ENCOUNTER — Other Ambulatory Visit: Payer: Self-pay | Admitting: Cardiovascular Disease

## 2019-08-20 ENCOUNTER — Other Ambulatory Visit: Payer: Self-pay | Admitting: Family Medicine

## 2019-08-20 NOTE — Telephone Encounter (Signed)
Please schedule F/U appointment with Dr. [name] for refills. Thank you!

## 2019-08-21 NOTE — Telephone Encounter (Signed)
CPE is on 08/24/19, cymbalta last filled on 02/28/19 #90 with 1 refill

## 2019-08-21 NOTE — Telephone Encounter (Signed)
Patient says Dr. [name] verbalized to fu as needed.  And meds to be rxd by pcp.  Patient declined .  Deleting recall.

## 2019-08-24 ENCOUNTER — Other Ambulatory Visit: Payer: Self-pay

## 2019-08-24 ENCOUNTER — Encounter: Payer: Self-pay | Admitting: Family Medicine

## 2019-08-24 ENCOUNTER — Ambulatory Visit (INDEPENDENT_AMBULATORY_CARE_PROVIDER_SITE_OTHER): Payer: Medicare Other | Admitting: Family Medicine

## 2019-08-24 VITALS — BP 122/68 | HR 73 | Temp 98.1°F | Ht 62.0 in | Wt 200.3 lb

## 2019-08-24 DIAGNOSIS — M8589 Other specified disorders of bone density and structure, multiple sites: Secondary | ICD-10-CM | POA: Diagnosis not present

## 2019-08-24 DIAGNOSIS — Z6836 Body mass index (BMI) 36.0-36.9, adult: Secondary | ICD-10-CM

## 2019-08-24 DIAGNOSIS — E785 Hyperlipidemia, unspecified: Secondary | ICD-10-CM

## 2019-08-24 DIAGNOSIS — E039 Hypothyroidism, unspecified: Secondary | ICD-10-CM

## 2019-08-24 DIAGNOSIS — E559 Vitamin D deficiency, unspecified: Secondary | ICD-10-CM

## 2019-08-24 DIAGNOSIS — E1169 Type 2 diabetes mellitus with other specified complication: Secondary | ICD-10-CM

## 2019-08-24 DIAGNOSIS — E119 Type 2 diabetes mellitus without complications: Secondary | ICD-10-CM | POA: Diagnosis not present

## 2019-08-24 DIAGNOSIS — F418 Other specified anxiety disorders: Secondary | ICD-10-CM

## 2019-08-24 DIAGNOSIS — I10 Essential (primary) hypertension: Secondary | ICD-10-CM

## 2019-08-24 DIAGNOSIS — Z0000 Encounter for general adult medical examination without abnormal findings: Secondary | ICD-10-CM

## 2019-08-24 NOTE — Patient Instructions (Addendum)
If you are interested in the new shingles vaccine (Shingrix) - call your local pharmacy to check on coverage and availability  If affordable, get on a wait list at your pharmacy to get the vaccine.  Don't forget to get your mammogram in January   Let's try half dose crestor twice weekly  If any side effects or problems let us know   Take care of yourself  Continue the exercise that you tolerate  Follow up in 6 months with labs prior

## 2019-08-24 NOTE — Progress Notes (Signed)
Subjective:    Patient ID: Tina Berger, female    DOB: [birth year], 68 y.o.   MRN: [id number]  HPI Here for health maintenance exam and to review chronic medical problems   Has been feeling good  Staying safe   Had amw on 10/29 Decided to check on coverage of shingrix-has not checked  Eye exam planned in dec   cologuard 11/17  Wants to put this off another year before re ordering   Mammogram 1/20 Self breast exam - no lumps   dexa 1/20-stable osteopenia No changes Falls- none  (tripped up the steps once but not a full blown fall) -she was rushing  Fractures -none  Supplements =taking vit D Exercise - has a Research officer, political party and a Dietitian -uses regularly  D level is 50   Wt Readings from Last 3 Encounters:  08/24/19 200 lb 5 oz (90.9 kg)  08/21/18 200 lb 4 oz (90.8 kg)  08/14/18 196 lb 8 oz (89.1 kg)  getting some exercise  Diet is good overall  36.64 kg/m   bp is stable today  No cp or palpitations or headaches or edema  No side effects to medicines  BP Readings from Last 3 Encounters:  08/24/19 122/68  08/21/18 126/74  08/14/18 122/82     Sees cardiology prn  Has not needed diltiazem   DM2 Lab Results  Component Value Date   HGBA1C 6.5 08/17/2019  good  Stable  Eye exam planned next month   Hypothyroidism  Pt has no clinical changes No change in energy level/ hair or skin/ edema and no tremor Lab Results  Component Value Date   TSH 0.57 08/17/2019     Depression and anxiety  More anxiety for the past month  Had to quit watching the news   Hyperlipidemia  Lab Results  Component Value Date   CHOL 194 08/17/2019   CHOL 215 (H) 08/14/2018   CHOL 209 (H) 08/10/2017   Lab Results  Component Value Date   HDL 40.00 08/17/2019   HDL 45.80 08/14/2018   HDL 43.90 08/10/2017   Lab Results  Component Value Date   LDLCALC 127 (H) 08/17/2019   LDLCALC 139 (H) 08/14/2018   LDLCALC 137 (H) 08/10/2017   Lab Results  Component Value Date   TRIG  139.0 08/17/2019   TRIG 153.0 (H) 08/14/2018   TRIG 142.0 08/10/2017   Lab Results  Component Value Date   CHOLHDL 5 08/17/2019   CHOLHDL 5 08/14/2018   CHOLHDL 5 08/10/2017   Lab Results  Component Value Date   LDLDIRECT 149.0 07/26/2016   LDLDIRECT 136.0 08/25/2015   LDLDIRECT 135.6 10/08/2011  intol of atorvastatin   Lab Results  Component Value Date   CREATININE 0.96 08/17/2019   BUN 23 08/17/2019   NA 141 08/17/2019   K 4.7 08/17/2019   CL 100 08/17/2019   CO2 34 (H) 08/17/2019   Lab Results  Component Value Date   ALT 12 08/17/2019   AST 14 08/17/2019   ALKPHOS 78 08/17/2019   BILITOT 0.4 08/17/2019   Lab Results  Component Value Date   WBC 6.2 08/17/2019   HGB 13.4 08/17/2019   HCT 41.6 08/17/2019   MCV 90.3 08/17/2019   PLT 328.0 08/17/2019   Patient Active Problem List   Diagnosis Date Noted  . Estrogen deficiency 08/21/2018  . TMJ (dislocation of temporomandibular joint), initial encounter 05/05/2018  . Anterior neck pain 05/05/2018  . Chronic pain syndrome 11/09/2017  . Chronic  upper extremity pain [facility] Area of Pain) (Bilateral) (L>R) 11/09/2017  . Fibromyalgia syndrome 11/09/2017  . Osteoarthritis 11/09/2017  . Osteoarthritis of lumbar facet joint (Bilateral) 11/09/2017  . Lumbar facet arthropathy (Bilateral) 11/09/2017  . Lumbar facet syndrome (Bilateral) (L>R) 11/09/2017  . Lumbar foraminal stenosis (multilevel) (Bilateral) 11/09/2017  . DDD (degenerative disc disease), lumbar 11/09/2017  . Thoracic levoscoliosis 11/09/2017  . Thoracic facet syndrome (Bilateral) (L>R) 11/09/2017  . Thoracic facet arthropathy (Bilateral) (R>L) 11/09/2017  . DDD (degenerative disc disease), thoracic 11/09/2017  . Thoracolumbar IVDD 11/09/2017  . DDD (degenerative disc disease), cervical 11/09/2017  . Osteoarthritis of cervical facet (Bilateral) 11/09/2017  . Grade 1 Anterolisthesis of C7 over T1 11/09/2017  . Cervical foraminal stenosis (Bilateral)  11/09/2017  . History of fusion of cervical spine (C5-6 ACDF) 11/09/2017  . Cervical facet syndrome (Bilateral) 11/09/2017  . Disorder of skeletal system 11/09/2017  . Cervical radiculitis (Bilateral) 11/09/2017  . Lumbar Epidural lipomatosis 11/09/2017  . Chronic sacroiliac joint pain (Bilateral) (L>R) 11/09/2017  . Chronic hip pain (Bilateral) (L>R) 11/09/2017  . Chronic upper back pain (Primary Area of Pain) (midline) 10/24/2017  . Chronic neck pain (Secondary Area of Pain) (Bilateral)  (L>R) 10/24/2017  . Chronic low back pain (Fourth Area of Pain) (Bilateral) (L>R) 10/24/2017  . Chronic lower extremity pain (Fifth Area of Pain) (Bilateral) (L>R) 10/24/2017  . Lumbar Grade 1 Retrolisthesis of L1-2 and L2-3 10/24/2017  . Other long term (current) drug therapy 10/24/2017  . Other specified health status 10/24/2017  . Long term current use of opiate analgesic 10/24/2017  . Long term prescription opiate use 10/24/2017  . Opiate use 10/24/2017  . DM type 2 (diabetes mellitus, type 2) ([place]) 06/14/2014  . Pharmacologic therapy 06/14/2014  . Bilateral lower extremity edema   . Post-menopausal 06/23/2012  . Interstitial lung disease ([place]) 03/21/2012  . Lumbar disc disease with radiculopathy 11/18/2011  . HTN (hypertension) 07/30/2011  . Raynaud disease 07/30/2011  . Obesity 07/30/2011  . Routine general medical examination at a health care facility 03/28/2011  . Palpitations 04/27/2010  . Problems influencing health status 12/11/2009  . Depression with anxiety 06/14/2008  . Vitamin D deficiency 05/13/2008  . ANXIETY 03/25/2008  . Osteopenia 03/25/2008  . Hypothyroidism 03/28/2007  . Hyperlipidemia associated with type 2 diabetes mellitus ([place]) 03/28/2007  . Other iron deficiency anemias 03/28/2007  . PANIC ATTACK 03/28/2007  . KERATOCONJUNCTIVITIS SICCA 03/28/2007  . Mitral valve disorder 03/28/2007  . ABNORMAL HEART RHYTHMS 03/28/2007  . Raynaud's syndrome 03/28/2007  . GERD  03/28/2007  . IBS 03/28/2007  . Rosacea 03/28/2007  . PLANTAR FASCIITIS 03/28/2007  . MIGRAINES, HX OF 03/28/2007   Past Medical History:  Diagnosis Date  . Acute renal insufficiency 05/31/2014  . Anemia, iron deficiency   . Bilateral lower extremity edema    a. uses torsemide  . Cervical dysplasia    abnormal paps  . Colon cancer screening 06/14/2014  . Cough 08/08/2012  . Degenerative disk disease 11/19/2011  . Diabetes mellitus type II    Diet controlled  . Diverticulosis   . DJD (degenerative joint disease)   . Drug rash 05/22/2011  . Dry eyes   . Edema   . Elevated liver enzymes 03/21/2012  . Encounter for routine gynecological examination 06/14/2014  . ESOPHAGITIS 03/28/2007   Qualifier: Hospitalized for  By: Marcelino Scot CMA, [place]    . Fibromyalgia   . GASTRITIS 03/28/2007   Qualifier: History of  By: Marcelino Scot CMA, [place] Bilberry    .  GERD (gastroesophageal reflux disease)   . Hemorrhoids    external  . HLD (hyperlipidemia)   . HNP (herniated nucleus pulposus) 2/99   T6,7,8 with DJD  . HTN (hypertension)   . Hyperglycemia 05/13/2008   Qualifier: Diagnosis of  By: Glori Bickers MD, Carmell Austria   . Hypothyroidism   . Interstitial lung disease ([place])    ?  Marland Kitchen Left ovarian cyst    x 3, rupture  . Osteoarthritis    hands  . Osteopenia    mild-11/01; improved 12/05  . Other screening mammogram 08/18/2011  . Palpitations   . PERSONAL HISTORY ALLERGY UNSPEC MEDICINAL AGENT 03/18/2010   Qualifier: Diagnosis of  By: Glori Bickers MD, Carmell Austria   . Raynaud's disease   . Recurrent HSV (herpes simplex virus)    lesions in nose  . Rhinitis 03/21/2012  . Routine general medical examination at a health care facility 03/28/2011   Past Surgical History:  Procedure Laterality Date  . COLONOSCOPY  11/01   Diverticulosis; hemorrhoids  . HAND SURGERY     left thumb  . LASIK     bilateral   Social History   Tobacco Use  . Smoking status: Former Smoker    Types: Cigarettes  . Smokeless tobacco:  Never Used  . Tobacco comment: quit over 40 years  Substance Use Topics  . Alcohol use: No    Alcohol/week: 0.0 standard drinks  . Drug use: No   Family History  Problem Relation Age of Onset  . Emphysema Father        + smoker  . Lung cancer Mother        + smoker  . Coronary artery disease Mother        relatively young  . Lymphoma Brother   . Lymphoma Sister   . Diabetes Brother   . Diabetes Sister   . Heart disease Brother   . Anemia Brother        aplactic   . Breast cancer Neg Hx    Allergies  Allergen Reactions  . Keflex [Cephalexin] Anaphylaxis  . Penicillins Anaphylaxis  . Amitriptyline Hcl     REACTION: sedating  . Atorvastatin     REACTION: muscle  . Benicar [Olmesartan Medoxomil]     Muscle pain   . Ceftin [Cefuroxime]     Swelling, "legs turn blue"  . Cetirizine Hcl     REACTION: headache  . Ciprofloxacin     REACTION: ? rash vs sun rxn  . Clonidine Derivatives     Swelling   . Diovan [Valsartan]     Thought it made her feel confused  . Erythromycin     Rash, swollen gums   . Etodolac     REACTION: reaction not known  . Fluoxetine Hcl     REACTION: stomach problems  . Furosemide     REACTION: swelling  . Gabapentin     REACTION: edema  . Naproxen Sodium     REACTION: edema  . Paroxetine     REACTION: weight gain  . Pregabalin     REACTION: swelling  . Sulfonamide Derivatives     Rash, swollen gums, lips  . Tetracycline     REACTION:inflammed genitals  . Venlafaxine     REACTION: sweating  . Cephalexin Hives and Rash   Current Outpatient Medications on File Prior to Visit  Medication Sig Dispense Refill  . buPROPion (WELLBUTRIN XL) 150 MG 24 hr tablet TAKE 1 TABLET BY MOUTH  DAILY 90 tablet  1  . Chlorphen-Phenyleph-ASA (ALKA-SELTZER PLUS COLD PO) Take by mouth as needed.      . cyclobenzaprine (FLEXERIL) 10 MG tablet TAKE 1/2 TO 1 TABLET BY  MOUTH ONCE DAILY AND 1  TABLET AT BEDTIME 180 tablet 1  . Diclofenac Sodium 1.5 % SOLN  Place 2 mLs onto the skin 4 (four) times daily. 3 Bottle 3  . diphenhydrAMINE (BENADRYL) 25 mg capsule Take 25 mg by mouth as needed.      . DULoxetine (CYMBALTA) 60 MG capsule TAKE 1 CAPSULE BY MOUTH  DAILY 90 capsule 1  . esomeprazole (NEXIUM) 20 MG capsule TAKE 1 CAPSULE BY MOUTH  DAILY 90 capsule 1  . famotidine (PEPCID) 40 MG tablet TAKE 1 TABLET BY MOUTH  DAILY 90 tablet 1  . ferrous sulfate 325 (65 FE) MG EC tablet Take 325 mg by mouth daily with breakfast.    . glucose blood test strip One Touch Ultra stripts blue-To check sugar once daily and as needed for DM2 250.00 100 each 3  . GUAIFENESIN CR PO Take by mouth as needed.      Marland Kitchen HYDROmorphone HCl (EXALGO) 12 MG T24A SR tablet Take 12 mg by mouth daily.    . hyoscyamine (LEVSIN SL) 0.125 MG SL tablet Take 1 tablet (0.125 mg total) by mouth every 4 (four) hours as needed for cramping. 270 tablet 0  . levothyroxine (SYNTHROID) 150 MCG tablet TAKE 1 TABLET BY MOUTH EVERY DAY 90 tablet 0  . Magnesium 400 MG CAPS Take by mouth daily.     . metFORMIN (GLUCOPHAGE) 500 MG tablet TAKE 1 TABLET BY MOUTH TWO  TIMES DAILY WITH A MEAL 180 tablet 1  . metoprolol succinate (TOPROL-XL) 50 MG 24 hr tablet TAKE 1 TABLET BY MOUTH 2  TIMES DAILY. TAKE WITH OR  IMMEDIATELY FOLLOWING A  MEAL. 60 tablet 0  . oxyCODONE (OXYCONTIN) 10 MG 12 hr tablet Take 10 mg by mouth 3 (three) times daily as needed.     . Probiotic Product (PROBIOTIC PO) Take 1 capsule by mouth daily.    . ramipril (ALTACE) 5 MG capsule TAKE 1 CAPSULE BY MOUTH  DAILY 90 capsule 1  . torsemide (DEMADEX) 20 MG tablet *NEEDS OFFICE VISIT FOR FURTHER REFILLS-PLEASE CALL [id number] TO SCHEDULE.* 60 tablet 0   No current facility-administered medications on file prior to visit.      Review of Systems  Constitutional: Negative for activity change, appetite change, fatigue, fever and unexpected weight change.  HENT: Negative for congestion, ear pain, rhinorrhea, sinus pressure and sore throat.    Eyes: Negative for pain, redness and visual disturbance.  Respiratory: Negative for cough, shortness of breath and wheezing.   Cardiovascular: Negative for chest pain and palpitations.  Gastrointestinal: Negative for abdominal pain, blood in stool, constipation and diarrhea.  Endocrine: Negative for polydipsia and polyuria.  Genitourinary: Negative for dysuria, frequency and urgency.  Musculoskeletal: Positive for arthralgias and myalgias. Negative for back pain.  Skin: Negative for pallor and rash.  Allergic/Immunologic: Negative for environmental allergies.  Neurological: Negative for dizziness, syncope and headaches.  Hematological: Negative for adenopathy. Does not bruise/bleed easily.  Psychiatric/Behavioral: Negative for decreased concentration and dysphoric mood. The patient is nervous/anxious.        Objective:   Physical Exam Constitutional:      General: She is not in acute distress.    Appearance: Normal appearance. She is well-developed. She is not ill-appearing or diaphoretic.  HENT:     Head: Normocephalic and  atraumatic.     Right Ear: Tympanic membrane, ear canal and external ear normal.     Left Ear: Tympanic membrane, ear canal and external ear normal.     Nose: Nose normal. No congestion.     Mouth/Throat:     Mouth: Mucous membranes are moist.     Pharynx: Oropharynx is clear. No posterior oropharyngeal erythema.  Eyes:     General: No scleral icterus.    Extraocular Movements: Extraocular movements intact.     Conjunctiva/sclera: Conjunctivae normal.     Pupils: Pupils are equal, round, and reactive to light.  Neck:     Musculoskeletal: Normal range of motion and neck supple. No neck rigidity or muscular tenderness.     Thyroid: No thyromegaly.     Vascular: No carotid bruit or JVD.  Cardiovascular:     Rate and Rhythm: Normal rate and regular rhythm.     Pulses: Normal pulses.     Heart sounds: Normal heart sounds. No gallop.      Comments: Baseline  raynaud's changes in feet Cool with hyperemia Pulmonary:     Effort: Pulmonary effort is normal. No respiratory distress.     Breath sounds: Normal breath sounds. No wheezing.     Comments: Good air exch Chest:     Chest wall: No tenderness.  Abdominal:     General: Bowel sounds are normal. There is no distension or abdominal bruit.     Palpations: Abdomen is soft. There is no mass.     Tenderness: There is no abdominal tenderness.     Hernia: No hernia is present.  Genitourinary:    Comments: Breast exam: No mass, nodules, thickening, tenderness, bulging, retraction, inflamation, nipple discharge or skin changes noted.  No axillary or clavicular LA.     Musculoskeletal: Normal range of motion.        General: No tenderness.     Right lower leg: No edema.     Left lower leg: No edema.  Lymphadenopathy:     Cervical: No cervical adenopathy.  Skin:    General: Skin is warm and dry.     Coloration: Skin is not pale.     Findings: No erythema or rash.  Neurological:     Mental Status: She is alert. Mental status is at baseline.     Cranial Nerves: No cranial nerve deficit.     Motor: No abnormal muscle tone.     Coordination: Coordination normal.     Gait: Gait normal.     Deep Tendon Reflexes: Reflexes are normal and symmetric.  Psychiatric:        Mood and Affect: Mood normal.           Assessment & Plan:   Problem List Items Addressed This Visit      Cardiovascular and Mediastinum   HTN (hypertension)    bp in fair control at this time  BP Readings from Last 1 Encounters:  08/24/19 122/68   No changes needed Most recent labs reviewed  Disc lifstyle change with low sodium diet and exercise        Relevant Medications   rosuvastatin (CRESTOR) 5 MG tablet (Start on 08/27/2019)     Endocrine   Hypothyroidism    Hypothyroidism  Pt has no clinical changes No change in energy level/ hair or skin/ edema and no tremor Lab Results  Component Value Date   TSH  0.57 08/17/2019          Hyperlipidemia associated with  type 2 diabetes mellitus (HCC)    Disc goals for lipids and reasons to control them Rev last labs with pt Rev low sat fat diet in detail  Intolerant of atorvastatin in the past Enc trial of low dose crestor twice weekly (5 mg) Will hold it and call if side effects       Relevant Medications   rosuvastatin (CRESTOR) 5 MG tablet (Start on 08/27/2019)   DM type 2 (diabetes mellitus, type 2) ([place])    Lab Results  Component Value Date   HGBA1C 6.5 08/17/2019   Eye exam planned next mo  Good control      Relevant Medications   rosuvastatin (CRESTOR) 5 MG tablet (Start on 08/27/2019)     Musculoskeletal and Integument   Osteopenia    dexa 1/20 stable  No falls or fx Taking D and level is tx  Enc exercise        Other   Vitamin D deficiency    Vitamin D level is therapeutic with current supplementation Disc importance of this to bone and overall health Level of 50       Depression with anxiety    More anxiety until she stopped watching the news  Doing better now  Continues current medication -wellbutrin and cymbalta      Routine general medical examination at a health care facility - Primary    Reviewed health habits including diet and exercise and skin cancer prevention Reviewed appropriate screening tests for age  Also reviewed health mt list, fam hx and immunization status , as well as social and family history   See HPI Labs reviewed  amw rev  Declined cologuard for another year  Planned eye exam  Enc exercise/wt loss  She will schedule her own mammogram  Also checking on coverage of shingrix        Obesity    Discussed how this problem influences overall health and the risks it imposes  Reviewed plan for weight loss with lower calorie diet (via better food choices and also portion control or program like weight watchers) and exercise building up to or more than 30 minutes 5 days per week including  some aerobic activity

## 2019-08-25 MED ORDER — ROSUVASTATIN CALCIUM 5 MG PO TABS: 5.0000 mg | ORAL_TABLET | ORAL | 3 refills | Status: DC

## 2019-08-25 NOTE — Assessment & Plan Note (Signed)
Vitamin D level is therapeutic with current supplementation ?Disc importance of this to bone and overall health ?Level of 50  ?

## 2019-08-25 NOTE — Assessment & Plan Note (Signed)
Discussed how this problem influences overall health and the risks it imposes  Reviewed plan for weight loss with lower calorie diet (via better food choices and also portion control or program like weight watchers) and exercise building up to or more than 30 minutes 5 days per week including some aerobic activity    

## 2019-08-25 NOTE — Assessment & Plan Note (Signed)
Hypothyroidism  Pt has no clinical changes No change in energy level/ hair or skin/ edema and no tremor Lab Results  Component Value Date   TSH 0.57 08/17/2019

## 2019-08-25 NOTE — Assessment & Plan Note (Signed)
Disc goals for lipids and reasons to control them Rev last labs with pt Rev low sat fat diet in detail  Intolerant of atorvastatin in the past Enc trial of low dose crestor twice weekly (5 mg) Will hold it and call if side effects

## 2019-08-25 NOTE — Assessment & Plan Note (Signed)
Lab Results  Component Value Date   HGBA1C 6.5 08/17/2019   Eye exam planned next mo  Good control

## 2019-08-25 NOTE — Assessment & Plan Note (Addendum)
Reviewed health habits including diet and exercise and skin cancer prevention Reviewed appropriate screening tests for age  Also reviewed health mt list, fam hx and immunization status , as well as social and family history   See HPI Labs reviewed  amw rev  Declined cologuard for another year  Planned eye exam  Enc exercise/wt loss  She will schedule her own mammogram  Also checking on coverage of shingrix

## 2019-08-25 NOTE — Assessment & Plan Note (Addendum)
More anxiety until she stopped watching the news  Doing better now  Continues current medication -wellbutrin and cymbalta

## 2019-08-25 NOTE — Assessment & Plan Note (Signed)
dexa 1/20 stable  No falls or fx Taking D and level is tx  Enc exercise

## 2019-08-25 NOTE — Assessment & Plan Note (Signed)
bp in fair control at this time  BP Readings from Last 1 Encounters:  08/24/19 122/68   No changes needed Most recent labs reviewed  Disc lifstyle change with low sodium diet and exercise

## 2019-09-10 ENCOUNTER — Other Ambulatory Visit: Payer: Self-pay | Admitting: Family Medicine

## 2019-09-10 DIAGNOSIS — H40003 Preglaucoma, unspecified, bilateral: Secondary | ICD-10-CM | POA: Diagnosis not present

## 2019-09-10 DIAGNOSIS — H43813 Vitreous degeneration, bilateral: Secondary | ICD-10-CM | POA: Diagnosis not present

## 2019-09-10 LAB — HM DIABETES EYE EXAM

## 2019-09-18 ENCOUNTER — Other Ambulatory Visit: Payer: Self-pay | Admitting: Family Medicine

## 2019-09-18 NOTE — Telephone Encounter (Signed)
Last filled on 07/20/19 #270 tabs with 0 refills, CPE was on 08/24/19

## 2019-09-27 ENCOUNTER — Encounter: Payer: Self-pay | Admitting: Family Medicine

## 2019-10-02 ENCOUNTER — Other Ambulatory Visit: Payer: Self-pay | Admitting: Family Medicine

## 2019-10-04 DIAGNOSIS — G894 Chronic pain syndrome: Secondary | ICD-10-CM | POA: Diagnosis not present

## 2019-10-04 DIAGNOSIS — M961 Postlaminectomy syndrome, not elsewhere classified: Secondary | ICD-10-CM | POA: Diagnosis not present

## 2019-10-04 DIAGNOSIS — Z79891 Long term (current) use of opiate analgesic: Secondary | ICD-10-CM | POA: Diagnosis not present

## 2019-10-04 DIAGNOSIS — M5137 Other intervertebral disc degeneration, lumbosacral region: Secondary | ICD-10-CM | POA: Diagnosis not present

## 2019-10-04 DIAGNOSIS — M4725 Other spondylosis with radiculopathy, thoracolumbar region: Secondary | ICD-10-CM | POA: Diagnosis not present

## 2019-10-08 DIAGNOSIS — L739 Follicular disorder, unspecified: Secondary | ICD-10-CM | POA: Diagnosis not present

## 2019-10-08 DIAGNOSIS — Z7689 Persons encountering health services in other specified circumstances: Secondary | ICD-10-CM | POA: Diagnosis not present

## 2019-10-26 ENCOUNTER — Encounter: Payer: Self-pay | Admitting: Family Medicine

## 2019-10-27 ENCOUNTER — Other Ambulatory Visit: Payer: Self-pay | Admitting: Family Medicine

## 2019-10-29 MED ORDER — METOPROLOL SUCCINATE ER 50 MG PO TB24: ORAL_TABLET | ORAL | 3 refills | Status: DC

## 2019-10-29 MED ORDER — METOPROLOL SUCCINATE ER 50 MG PO TB24: ORAL_TABLET | ORAL | 0 refills | Status: DC

## 2019-12-13 DIAGNOSIS — G894 Chronic pain syndrome: Secondary | ICD-10-CM | POA: Diagnosis not present

## 2019-12-13 DIAGNOSIS — M961 Postlaminectomy syndrome, not elsewhere classified: Secondary | ICD-10-CM | POA: Diagnosis not present

## 2019-12-13 DIAGNOSIS — M5137 Other intervertebral disc degeneration, lumbosacral region: Secondary | ICD-10-CM | POA: Diagnosis not present

## 2019-12-13 DIAGNOSIS — M4725 Other spondylosis with radiculopathy, thoracolumbar region: Secondary | ICD-10-CM | POA: Diagnosis not present

## 2019-12-13 DIAGNOSIS — Z79891 Long term (current) use of opiate analgesic: Secondary | ICD-10-CM | POA: Diagnosis not present

## 2019-12-27 DIAGNOSIS — L738 Other specified follicular disorders: Secondary | ICD-10-CM | POA: Diagnosis not present

## 2019-12-31 ENCOUNTER — Other Ambulatory Visit: Payer: Self-pay | Admitting: Family Medicine

## 2019-12-31 NOTE — Telephone Encounter (Signed)
CPE was on 08/24/19, last filled on 09/18/19 #270 tabs with 0 refills

## 2020-01-19 ENCOUNTER — Encounter: Payer: Self-pay | Admitting: Family Medicine

## 2020-02-14 DIAGNOSIS — M5137 Other intervertebral disc degeneration, lumbosacral region: Secondary | ICD-10-CM | POA: Diagnosis not present

## 2020-02-14 DIAGNOSIS — Z79891 Long term (current) use of opiate analgesic: Secondary | ICD-10-CM | POA: Diagnosis not present

## 2020-02-14 DIAGNOSIS — M4725 Other spondylosis with radiculopathy, thoracolumbar region: Secondary | ICD-10-CM | POA: Diagnosis not present

## 2020-02-14 DIAGNOSIS — M961 Postlaminectomy syndrome, not elsewhere classified: Secondary | ICD-10-CM | POA: Diagnosis not present

## 2020-02-14 DIAGNOSIS — G894 Chronic pain syndrome: Secondary | ICD-10-CM | POA: Diagnosis not present

## 2020-03-21 ENCOUNTER — Other Ambulatory Visit: Payer: Self-pay | Admitting: Family Medicine

## 2020-03-25 ENCOUNTER — Other Ambulatory Visit: Payer: Self-pay | Admitting: Family Medicine

## 2020-03-25 NOTE — Telephone Encounter (Signed)
CPE was on 08/24/19, last filled on 07/04/19 #180 tabs with 1 refill

## 2020-04-03 DIAGNOSIS — M5137 Other intervertebral disc degeneration, lumbosacral region: Secondary | ICD-10-CM | POA: Diagnosis not present

## 2020-04-03 DIAGNOSIS — Z79899 Other long term (current) drug therapy: Secondary | ICD-10-CM | POA: Diagnosis not present

## 2020-04-03 DIAGNOSIS — G894 Chronic pain syndrome: Secondary | ICD-10-CM | POA: Diagnosis not present

## 2020-04-03 DIAGNOSIS — M4725 Other spondylosis with radiculopathy, thoracolumbar region: Secondary | ICD-10-CM | POA: Diagnosis not present

## 2020-04-03 DIAGNOSIS — M961 Postlaminectomy syndrome, not elsewhere classified: Secondary | ICD-10-CM | POA: Diagnosis not present

## 2020-04-03 DIAGNOSIS — Z79891 Long term (current) use of opiate analgesic: Secondary | ICD-10-CM | POA: Diagnosis not present

## 2020-04-14 ENCOUNTER — Emergency Department
Admission: EM | Admit: 2020-04-14 | Discharge: 2020-04-14 | Disposition: A | Discharge status: Home / Self Care | Payer: Medicare Other | Attending: Emergency Medicine | Admitting: Emergency Medicine

## 2020-04-14 ENCOUNTER — Encounter: Payer: Self-pay | Admitting: Emergency Medicine

## 2020-04-14 ENCOUNTER — Telehealth: Payer: Self-pay

## 2020-04-14 ENCOUNTER — Emergency Department: Payer: Medicare Other

## 2020-04-14 ENCOUNTER — Other Ambulatory Visit: Payer: Self-pay

## 2020-04-14 DIAGNOSIS — Z79899 Other long term (current) drug therapy: Secondary | ICD-10-CM | POA: Diagnosis not present

## 2020-04-14 DIAGNOSIS — Z7984 Long term (current) use of oral hypoglycemic drugs: Secondary | ICD-10-CM | POA: Diagnosis not present

## 2020-04-14 DIAGNOSIS — E119 Type 2 diabetes mellitus without complications: Secondary | ICD-10-CM | POA: Insufficient documentation

## 2020-04-14 DIAGNOSIS — R1031 Right lower quadrant pain: Secondary | ICD-10-CM | POA: Diagnosis not present

## 2020-04-14 DIAGNOSIS — R112 Nausea with vomiting, unspecified: Secondary | ICD-10-CM | POA: Diagnosis not present

## 2020-04-14 DIAGNOSIS — Z87891 Personal history of nicotine dependence: Secondary | ICD-10-CM | POA: Insufficient documentation

## 2020-04-14 DIAGNOSIS — K5720 Diverticulitis of large intestine with perforation and abscess without bleeding: Secondary | ICD-10-CM | POA: Insufficient documentation

## 2020-04-14 DIAGNOSIS — R1110 Vomiting, unspecified: Secondary | ICD-10-CM | POA: Diagnosis not present

## 2020-04-14 DIAGNOSIS — K5792 Diverticulitis of intestine, part unspecified, without perforation or abscess without bleeding: Secondary | ICD-10-CM

## 2020-04-14 DIAGNOSIS — K388 Other specified diseases of appendix: Secondary | ICD-10-CM | POA: Diagnosis not present

## 2020-04-14 DIAGNOSIS — Q438 Other specified congenital malformations of intestine: Secondary | ICD-10-CM | POA: Diagnosis not present

## 2020-04-14 DIAGNOSIS — E039 Hypothyroidism, unspecified: Secondary | ICD-10-CM | POA: Diagnosis not present

## 2020-04-14 DIAGNOSIS — K6389 Other specified diseases of intestine: Secondary | ICD-10-CM

## 2020-04-14 DIAGNOSIS — I10 Essential (primary) hypertension: Secondary | ICD-10-CM | POA: Diagnosis not present

## 2020-04-14 LAB — COMPREHENSIVE METABOLIC PANEL
ALT: 25 U/L (ref 0–44)
AST: 23 U/L (ref 15–41)
Albumin: 4.3 g/dL (ref 3.5–5.0)
Alkaline Phosphatase: 62 U/L (ref 38–126)
Anion gap: 12 (ref 5–15)
BUN: 25 mg/dL — ABNORMAL HIGH (ref 8–23)
CO2: 26 mmol/L (ref 22–32)
Calcium: 9.3 mg/dL (ref 8.9–10.3)
Chloride: 99 mmol/L (ref 98–111)
Creatinine, Ser: 0.77 mg/dL (ref 0.44–1.00)
GFR calc Af Amer: >60 mL/min (ref >60)
GFR calc non Af Amer: >60 mL/min (ref >60)
Glucose, Bld: 121 mg/dL — ABNORMAL HIGH (ref 70–99)
Potassium: 3.9 mmol/L (ref 3.5–5.1)
Sodium: 137 mmol/L (ref 135–145)
Total Bilirubin: 0.7 mg/dL (ref 0.3–1.2)
Total Protein: 7.7 g/dL (ref 6.5–8.1)

## 2020-04-14 LAB — URINALYSIS, COMPLETE (UACMP) WITH MICROSCOPIC
Bacteria, UA: NONE SEEN
Bilirubin Urine: NEGATIVE
Glucose, UA: NEGATIVE mg/dL
Hgb urine dipstick: NEGATIVE
Ketones, ur: NEGATIVE mg/dL
Nitrite: NEGATIVE
Protein, ur: NEGATIVE mg/dL
Specific Gravity, Urine: 1.016 (ref 1.005–1.030)
pH: 6 (ref 5.0–8.0)

## 2020-04-14 LAB — CBC
HCT: 40.9 % (ref 36.0–46.0)
Hemoglobin: 13.6 g/dL (ref 12.0–15.0)
MCH: 29.4 pg (ref 26.0–34.0)
MCHC: 33.3 g/dL (ref 30.0–36.0)
MCV: 88.5 fL (ref 80.0–100.0)
Platelets: 311 10*3/uL (ref 150–400)
RBC: 4.62 MIL/uL (ref 3.87–5.11)
RDW: 11.9 % (ref 11.5–15.5)
WBC: 8.2 10*3/uL (ref 4.0–10.5)
nRBC: 0 % (ref 0.0–0.2)

## 2020-04-14 LAB — LIPASE, BLOOD: Lipase: 31 U/L (ref 11–51)

## 2020-04-14 MED ORDER — IBUPROFEN 600 MG PO TABS
600.0000 mg | ORAL_TABLET | Freq: Three times a day (TID) | ORAL | 0 refills | Status: DC | PRN
Start: 2020-04-14 — End: 2020-08-26

## 2020-04-14 MED ORDER — ONDANSETRON HCL 4 MG/2ML IJ SOLN
4.0000 mg | Freq: Once | INTRAMUSCULAR | Status: AC
  Administered 2020-04-14: 4 mg via INTRAVENOUS
  Filled 2020-04-14: qty 2

## 2020-04-14 MED ORDER — MOXIFLOXACIN HCL 400 MG PO TABS: 400.0000 mg | ORAL_TABLET | Freq: Every day | ORAL | 0 refills | Status: AC

## 2020-04-14 MED ORDER — ONDANSETRON 4 MG PO TBDP
4.0000 mg | ORAL_TABLET | Freq: Three times a day (TID) | ORAL | 0 refills | Status: DC | PRN
Start: 2020-04-14 — End: 2020-04-14

## 2020-04-14 MED ORDER — IBUPROFEN 600 MG PO TABS
600.0000 mg | ORAL_TABLET | Freq: Three times a day (TID) | ORAL | 0 refills | Status: DC | PRN
Start: 2020-04-14 — End: 2020-04-14

## 2020-04-14 MED ORDER — SODIUM CHLORIDE 0.9 % IV BOLUS
1000.0000 mL | Freq: Once | INTRAVENOUS | Status: AC
  Administered 2020-04-14: 1000 mL via INTRAVENOUS

## 2020-04-14 MED ORDER — ONDANSETRON 4 MG PO TBDP
4.0000 mg | ORAL_TABLET | Freq: Three times a day (TID) | ORAL | 0 refills | Status: DC | PRN
Start: 2020-04-14 — End: 2020-08-26

## 2020-04-14 MED ORDER — IOHEXOL 300 MG/ML  SOLN
100.0000 mL | Freq: Once | INTRAMUSCULAR | Status: AC | PRN
  Administered 2020-04-14: 100 mL via INTRAVENOUS
  Filled 2020-04-14: qty 100

## 2020-04-14 MED ORDER — MOXIFLOXACIN HCL 400 MG PO TABS: 400.0000 mg | ORAL_TABLET | Freq: Every day | ORAL | 0 refills | Status: DC

## 2020-04-14 MED ORDER — MORPHINE SULFATE (PF) 4 MG/ML IV SOLN
4.0000 mg | Freq: Once | INTRAVENOUS | Status: AC
  Administered 2020-04-14: 4 mg via INTRAVENOUS
  Filled 2020-04-14: qty 1

## 2020-04-14 NOTE — ED Notes (Signed)
Pt c/o R side/flank pain and abdominal pain since Saturday, reports approx 10 episodes of diarrhea yesterday. Pt also states hx of diverticulitis and that this feels the same. Pt A&O x4. Pt ambulatory from lobby to triage room. Pt visualized in NAD upon arrival to triage room at this time.

## 2020-04-14 NOTE — ED Notes (Signed)
NAD noted at time of D/C. Pt denies questions or concerns. Pt ambulatory to the lobby at this time with her husband.

## 2020-04-14 NOTE — Telephone Encounter (Signed)
pt having shower and I spoke with Mr Cloer (DPR signed) pt having rt sided sharp on and off lower abd pain since 04/12/20; pt presently having the abd pain, chills, freezing,H/A and had diarrhea on 04/12/20. pts husband came home to take pt to ED; pt will go to ED when gets out of shower. FYI to Dr Glori Bickers who is out of office and Gentry Fitz NP who is in office.

## 2020-04-14 NOTE — Telephone Encounter (Signed)
[facility] - Client TELEPHONE ADVICE RECORD [facility] Patient Name: Tina Berger Gender: Female DOB: 1950/12/03 Age: 69 Y 2 M 6 D Return Phone Number: [phone] (Primary), [phone] (Secondary) Address: City/State/Zip: [place] [address] Client [facility] - Client Client Site [place] MD Contact Type Call Who Is Calling Patient / Member / Family / Caregiver Call Type Triage / Clinical Relationship To Patient Self Return Phone Number [phone] (Primary) Chief Complaint ABDOMINAL PAIN - Severe and only in abdomen Reason for Call Symptomatic / Request for [place] states she is having severe pain on the right side of her abdomen. Translation No Nurse Assessment Nurse: Melina Modena, RN, Estill Bamberg Date/Time (Eastern Time): 04/14/2020 8:52:18 AM Confirm and document reason for call. If symptomatic, describe symptoms. ---Caller states she has RLQ abd pain. Has the patient had close contact with a person known or suspected to have the novel coronavirus illness OR traveled / lives in area with major community spread (including international travel) in the last 14 days from the onset of symptoms? * If Asymptomatic, screen for exposure and travel within the last 14 days. ---No Does the patient have any new or worsening symptoms? ---Yes Will a triage be completed? ---Yes Related visit to physician within the last 2 weeks? ---No Does the PT have any chronic conditions? (i.e. diabetes, asthma, this includes High risk factors for pregnancy, etc.) ---Yes List chronic conditions. ---diabetes Is this a behavioral health or substance abuse call? ---No Guidelines Guideline Title Affirmed Question Affirmed Notes Nurse Date/Time (Eastern Time) Abdominal Pain - Female [1] SEVERE pain (e.g., excruciating) AND [2] present > 1 hour [place], Lurline Hare  04/14/2020 8:53:23 AM Disp. Time Eilene Ghazi Time) Disposition Final User 04/14/2020 8:49:29 AM Send to Urgent Merrily Pew PLEASE NOTE: All timestamps contained within this report are represented as [place] Standard Time. CONFIDENTIALTY NOTICE: This fax transmission is intended only for the addressee. It contains information that is legally privileged, confidential or otherwise protected from use or disclosure. If you are not the intended recipient, you are strictly prohibited from reviewing, disclosing, copying using or disseminating any of this information or taking any action in reliance on or regarding this information. If you have received this fax in error, please notify us immediately by telephone so that we can arrange for its return to [place]. Phone: [phone], Toll-Free: [phone], Fax: [phone] Page: 2 of 2 Call Id: [id number] 04/14/2020 8:58:44 AM Go to ED Now Yes Melina Modena, RN, Shelly Coss Disagree/Comply Comply Caller Understands Yes PreDisposition Call Doctor Care Advice Given Per Guideline GO TO ED NOW: NOTHING BY MOUTH: CARE ADVICE given per Abdominal Pain, Female (Adult) guideline. Comments User: Tristan Schroeder, RN Date/Time Eilene Ghazi Time): 04/14/2020 8:58:37 AM Caller request the office called her when the phones roll over she will not be going to the ED at this time Referrals REFERRED TO PCP OFFICE

## 2020-04-14 NOTE — Telephone Encounter (Signed)
Aware, I will watch for correspondence

## 2020-04-14 NOTE — ED Provider Notes (Signed)
[facility] Emergency Department Provider Note  ____________________________________________   First MD Initiated Contact with Patient 04/14/20 1353     (approximate)  I have reviewed the triage vital signs and the nursing notes.   HISTORY  Chief Complaint Abdominal Pain    HPI Tina Berger is a 69 y.o. female  With h/o diverticulosis, DM, HTN, HLD, here with abd pain. Pt reports several days of aching, gnawing, gradual onset RLQ abd pain along with chills, poor appetite. She has had some loose stools as well. No vomiting. H/o diverticulosis of similar location and quality in the past. Denies any relation w/ eating. Pain is intermittently cramp like but also worse w/ movement, palpation. No specific alleviating factors. No urinary symptoms.        Past Medical History:  Diagnosis Date  . Acute renal insufficiency 05/31/2014  . Anemia, iron deficiency   . Bilateral lower extremity edema    a. uses torsemide  . Cervical dysplasia    abnormal paps  . Colon cancer screening 06/14/2014  . Cough 08/08/2012  . Degenerative disk disease 11/19/2011  . Diabetes mellitus type II    Diet controlled  . Diverticulosis   . DJD (degenerative joint disease)   . Drug rash 05/22/2011  . Dry eyes   . Edema   . Elevated liver enzymes 03/21/2012  . Encounter for routine gynecological examination 06/14/2014  . ESOPHAGITIS 03/28/2007   Qualifier: Hospitalized for  By: Marcelino Scot CMA, [place]    . Fibromyalgia   . GASTRITIS 03/28/2007   Qualifier: History of  By: Marcelino Scot CMA, [place] Bilberry    . GERD (gastroesophageal reflux disease)   . Hemorrhoids    external  . HLD (hyperlipidemia)   . HNP (herniated nucleus pulposus) 2/99   T6,7,8 with DJD  . HTN (hypertension)   . Hyperglycemia 05/13/2008   Qualifier: Diagnosis of  By: Glori Bickers MD, Carmell Austria   . Hypothyroidism   . Interstitial lung disease ([place])    ?  Marland Kitchen Left ovarian cyst    x 3, rupture  . Osteoarthritis     hands  . Osteopenia    mild-11/01; improved 12/05  . Other screening mammogram 08/18/2011  . Palpitations   . PERSONAL HISTORY ALLERGY UNSPEC MEDICINAL AGENT 03/18/2010   Qualifier: Diagnosis of  By: Glori Bickers MD, Carmell Austria   . Raynaud's disease   . Recurrent HSV (herpes simplex virus)    lesions in nose  . Rhinitis 03/21/2012  . Routine general medical examination at a health care facility 03/28/2011    Patient Active Problem List   Diagnosis Date Noted  . Estrogen deficiency 08/21/2018  . TMJ (dislocation of temporomandibular joint), initial encounter 05/05/2018  . Anterior neck pain 05/05/2018  . Chronic pain syndrome 11/09/2017  . Chronic upper extremity pain [facility] Area [facility] Pain) (Bilateral) (L>R) 11/09/2017  . Fibromyalgia syndrome 11/09/2017  . Osteoarthritis 11/09/2017  . Osteoarthritis of lumbar facet joint (Bilateral) 11/09/2017  . Lumbar facet arthropathy (Bilateral) 11/09/2017  . Lumbar facet syndrome (Bilateral) (L>R) 11/09/2017  . Lumbar foraminal stenosis (multilevel) (Bilateral) 11/09/2017  . DDD (degenerative disc disease), lumbar 11/09/2017  . Thoracic levoscoliosis 11/09/2017  . Thoracic facet syndrome (Bilateral) (L>R) 11/09/2017  . Thoracic facet arthropathy (Bilateral) (R>L) 11/09/2017  . DDD (degenerative disc disease), thoracic 11/09/2017  . Thoracolumbar IVDD 11/09/2017  . DDD (degenerative disc disease), cervical 11/09/2017  . Osteoarthritis of cervical facet (Bilateral) 11/09/2017  . Grade 1 Anterolisthesis of C7 over T1 11/09/2017  .  Cervical foraminal stenosis (Bilateral) 11/09/2017  . History of fusion of cervical spine (C5-6 ACDF) 11/09/2017  . Cervical facet syndrome (Bilateral) 11/09/2017  . Disorder of skeletal system 11/09/2017  . Cervical radiculitis (Bilateral) 11/09/2017  . Lumbar Epidural lipomatosis 11/09/2017  . Chronic sacroiliac joint pain (Bilateral) (L>R) 11/09/2017  . Chronic hip pain (Bilateral) (L>R) 11/09/2017  . Chronic upper back  pain (Primary Area of Pain) (midline) 10/24/2017  . Chronic neck pain (Secondary Area of Pain) (Bilateral)  (L>R) 10/24/2017  . Chronic low back pain (Fourth Area of Pain) (Bilateral) (L>R) 10/24/2017  . Chronic lower extremity pain (Fifth Area of Pain) (Bilateral) (L>R) 10/24/2017  . Lumbar Grade 1 Retrolisthesis of L1-2 and L2-3 10/24/2017  . Other long term (current) drug therapy 10/24/2017  . Other specified health status 10/24/2017  . Long term current use of opiate analgesic 10/24/2017  . Long term prescription opiate use 10/24/2017  . Opiate use 10/24/2017  . DM type 2 (diabetes mellitus, type 2) ([place]) 06/14/2014  . Pharmacologic therapy 06/14/2014  . Bilateral lower extremity edema   . Post-menopausal 06/23/2012  . Lumbar disc disease with radiculopathy 11/18/2011  . HTN (hypertension) 07/30/2011  . Raynaud disease 07/30/2011  . Obesity 07/30/2011  . Routine general medical examination at a health care facility 03/28/2011  . Palpitations 04/27/2010  . Problems influencing health status 12/11/2009  . Depression with anxiety 06/14/2008  . Vitamin D deficiency 05/13/2008  . ANXIETY 03/25/2008  . Osteopenia 03/25/2008  . Hypothyroidism 03/28/2007  . Hyperlipidemia associated with type 2 diabetes mellitus ([place]) 03/28/2007  . Other iron deficiency anemias 03/28/2007  . PANIC ATTACK 03/28/2007  . KERATOCONJUNCTIVITIS SICCA 03/28/2007  . Mitral valve disorder 03/28/2007  . ABNORMAL HEART RHYTHMS 03/28/2007  . Raynaud's syndrome 03/28/2007  . GERD 03/28/2007  . IBS 03/28/2007  . Rosacea 03/28/2007  . PLANTAR FASCIITIS 03/28/2007  . MIGRAINES, HX OF 03/28/2007    Past Surgical History:  Procedure Laterality Date  . COLONOSCOPY  11/01   Diverticulosis; hemorrhoids  . HAND SURGERY     left thumb  . LASIK     bilateral    Prior to Admission medications   Medication Sig Start Date End Date Taking? Authorizing Provider  buPROPion (WELLBUTRIN XL) 150 MG 24 hr tablet TAKE 1  TABLET BY MOUTH  DAILY 09/10/19   Tower, [name], MD  Chlorphen-Phenyleph-ASA (ALKA-SELTZER PLUS COLD PO) Take by mouth as needed.      [provider]  cyclobenzaprine (FLEXERIL) 10 MG tablet TAKE 1/2 TO 1 TABLET BY  MOUTH ONCE DAILY AND 1  TABLET AT BEDTIME 03/25/20   Tower, Russell A, MD  Diclofenac Sodium 1.5 % SOLN Place 2 mLs onto the skin 4 (four) times daily. 08/15/15   Tower, [name], MD  diphenhydrAMINE (BENADRYL) 25 mg capsule Take 25 mg by mouth as needed.      [provider]  DULoxetine (CYMBALTA) 60 MG capsule TAKE 1 CAPSULE BY MOUTH  DAILY 08/21/19   Tower, [name], MD  esomeprazole (NEXIUM) 20 MG capsule TAKE 1 CAPSULE BY MOUTH  DAILY 08/21/19   Tower, [name], MD  famotidine (PEPCID) 40 MG tablet TAKE 1 TABLET BY MOUTH  DAILY 07/04/19   Tower, [name], MD  ferrous sulfate 325 (65 FE) MG EC tablet Take 325 mg by mouth daily with breakfast.    [provider]  glucose blood test strip One Touch Ultra stripts blue-To check sugar once daily and as needed for DM2 250.00 03/17/12  Tower, [name], MD  GUAIFENESIN CR PO Take by mouth as needed.      [provider]  HYDROmorphone HCl (EXALGO) 12 MG T24A SR tablet Take 12 mg by mouth daily.    [provider]  hyoscyamine (LEVSIN SL) 0.125 MG SL tablet TAKE 1 TABLET (0.125 MG TOTAL) BY MOUTH EVERY 4 (FOUR) HOURS AS NEEDED FOR CRAMPING. 12/31/19   Tower, [name], MD  ibuprofen (ADVIL) 600 MG tablet Take 1 tablet (600 mg total) by mouth every 8 (eight) hours as needed for moderate pain. 04/14/20   Duffy Bruce, MD  levothyroxine (SYNTHROID) 150 MCG tablet TAKE 1 TABLET BY MOUTH EVERY DAY 10/27/19   Tower, [name], MD  Magnesium 400 MG CAPS Take by mouth daily.     [provider]  metFORMIN (GLUCOPHAGE) 500 MG tablet TAKE 1 TABLET BY MOUTH TWO  TIMES DAILY WITH A MEAL 03/21/20   Tower, Malverne A, MD  metoprolol succinate (TOPROL-XL) 50 MG 24 hr tablet TAKE 1 TABLET BY MOUTH 2  TIMES DAILY. TAKE WITH  OR  IMMEDIATELY FOLLOWING A  MEAL. 10/29/19   Tower, [name], MD  moxifloxacin (AVELOX) 400 MG tablet Take 1 tablet (400 mg total) by mouth daily for 7 days. 04/14/20 04/21/20  Duffy Bruce, MD  ondansetron (ZOFRAN ODT) 4 MG disintegrating tablet Take 1 tablet (4 mg total) by mouth every 8 (eight) hours as needed for nausea or vomiting. 04/14/20   Duffy Bruce, MD  oxyCODONE (OXYCONTIN) 10 MG 12 hr tablet Take 10 mg by mouth 3 (three) times daily as needed.     [provider]  Probiotic Product (PROBIOTIC PO) Take 1 capsule by mouth daily.    [provider]  ramipril (ALTACE) 5 MG capsule TAKE 1 CAPSULE BY MOUTH  DAILY 03/21/20   Tower, [name], MD  rosuvastatin (CRESTOR) 5 MG tablet Take 1 tablet (5 mg total) by mouth 2 (two) times a week. 08/27/19   Tower, [name], MD  torsemide (DEMADEX) 20 MG tablet *NEEDS OFFICE VISIT FOR FURTHER REFILLS-PLEASE CALL [phone] TO SCHEDULE.* 08/21/19   Minna Merritts, MD    Allergies Keflex [cephalexin], Penicillins, Amitriptyline hcl, Atorvastatin, Benicar [olmesartan medoxomil], Ceftin [cefuroxime], Cetirizine hcl, Ciprofloxacin, Clonidine derivatives, Diovan [valsartan], Erythromycin, Etodolac, Fluoxetine hcl, Furosemide, Gabapentin, Naproxen sodium, Paroxetine, Pregabalin, Sulfonamide derivatives, Tetracycline, Venlafaxine, and Cephalexin  Family History  Problem Relation Age of Onset  . Emphysema Father        + smoker  . Lung cancer Mother        + smoker  . Coronary artery disease Mother        relatively young  . Lymphoma Brother   . Lymphoma Sister   . Diabetes Brother   . Diabetes Sister   . Heart disease Brother   . Anemia Brother        aplactic   . Breast cancer Neg Hx     Social History Social History   Tobacco Use  . Smoking status: Former Smoker    Types: Cigarettes  . Smokeless tobacco: Never Used  . Tobacco comment: quit over 40 years  Vaping Use  . Vaping Use: Never used  Substance Use Topics  .  Alcohol use: No    Alcohol/week: 0.0 standard drinks  . Drug use: No    Review of Systems  Review of Systems  Constitutional: Positive for chills and fatigue. Negative for fever.  HENT: Negative for congestion and sore throat.   Eyes: Negative  for visual disturbance.  Respiratory: Negative for cough and shortness of breath.   Cardiovascular: Negative for chest pain.  Gastrointestinal: Positive for abdominal pain, diarrhea and nausea. Negative for vomiting.  Genitourinary: Negative for flank pain.  Musculoskeletal: Negative for back pain and neck pain.  Skin: Negative for rash and wound.  Neurological: Negative for weakness.  All other systems reviewed and are negative.    ____________________________________________  PHYSICAL EXAM:      VITAL SIGNS: ED Triage Vitals  Enc Vitals Group     BP 04/14/20 1057 (!) 145/80     Pulse Rate 04/14/20 1057 78     Resp 04/14/20 1057 18     Temp 04/14/20 1057 99 F (37.2 C)     Temp Source 04/14/20 1057 Oral     SpO2 04/14/20 1057 97 %     Weight 04/14/20 1058 200 lb (90.7 kg)     Height 04/14/20 1058 5\' 2"  (1.575 m)     Head Circumference --      Peak Flow --      Pain Score 04/14/20 1057 8     Pain Loc --      Pain Edu? --      Excl. in [place]? --      Physical Exam Vitals and nursing note reviewed.  Constitutional:      General: She is not in acute distress.    Appearance: She is well-developed.  HENT:     Head: Normocephalic and atraumatic.  Eyes:     Conjunctiva/sclera: Conjunctivae normal.  Cardiovascular:     Rate and Rhythm: Normal rate and regular rhythm.     Heart sounds: Normal heart sounds. No murmur heard.  No friction rub.  Pulmonary:     Effort: Pulmonary effort is normal. No respiratory distress.     Breath sounds: Normal breath sounds. No wheezing or rales.  Abdominal:     General: There is no distension.     Palpations: Abdomen is soft.     Tenderness: There is abdominal tenderness in the right lower  quadrant and periumbilical area.  Musculoskeletal:     Cervical back: Neck supple.  Skin:    General: Skin is warm.     Capillary Refill: Capillary refill takes less than 2 seconds.  Neurological:     Mental Status: She is alert and oriented to person, place, and time.     Motor: No abnormal muscle tone.       ____________________________________________   LABS (all labs ordered are listed, but only abnormal results are displayed)  Labs Reviewed  COMPREHENSIVE METABOLIC PANEL - Abnormal; Notable for the following components:      Result Value   Glucose, Bld 121 (*)    BUN 25 (*)    All other components within normal limits  URINALYSIS, COMPLETE (UACMP) WITH MICROSCOPIC - Abnormal; Notable for the following components:   Color, Urine YELLOW (*)    APPearance CLEAR (*)    Leukocytes,Ua TRACE (*)    All other components within normal limits  LIPASE, BLOOD  CBC    ____________________________________________  EKG: None ________________________________________  RADIOLOGY All imaging, including plain films, CT scans, and ultrasounds, independently reviewed by me, and interpretations confirmed via formal radiology reads.  ED MD interpretation:   CT A/P: As below - epiploic appendagitis, diverticulosis   Official radiology report(s): CT ABDOMEN PELVIS W CONTRAST  Result Date: 04/14/2020 CLINICAL DATA:  Nausea and vomiting right lower quadrant pain with diarrhea EXAM: CT ABDOMEN AND  PELVIS WITH CONTRAST TECHNIQUE: Multidetector CT imaging of the abdomen and pelvis was performed using the standard protocol following bolus administration of intravenous contrast. CONTRAST:  144mL OMNIPAQUE IOHEXOL 300 MG/ML  SOLN COMPARISON:  Ultrasound 03/06/2012 FINDINGS: Lower chest: No acute abnormality. Hepatobiliary: Liver contour is slightly lobulated. No calcified gallstone or biliary dilatation Pancreas: Unremarkable. No pancreatic ductal dilatation or surrounding inflammatory changes.  Spleen: Normal in size without focal abnormality. Adrenals/Urinary Tract: Adrenal glands are normal. Kidneys show no hydronephrosis. The urinary bladder is unremarkable Stomach/Bowel: The stomach is nonenlarged. No dilated small bowel. Negative appendix. Diverticular disease of the descending and sigmoid colon. Probable collapsed appearance of the sigmoid colon. Vascular/Lymphatic: Mild aortic atherosclerosis without aneurysm. No suspicious nodes Reproductive: Uterus and bilateral adnexa are unremarkable. Other: No free air or free fluid. Focal inflammatory changes in the right lower quadrant appears to be centered around central fat density. Musculoskeletal: Chronic appearing mild wedging at the thoracolumbar junction. IMPRESSION: 1. Negative for acute appendicitis. 2. Focal inflammatory changes in the right lower quadrant centered around central fat density, potentially representing epiploic appendagitis. 3. Slightly lobulated contour of the liver, question cirrhosis. 4. Diverticular disease of the descending and sigmoid colon. Favor collapsed appearance of the sigmoid colon over colitis or diverticulitis given absence of surrounding colonic inflammation. Aortic Atherosclerosis (ICD10-I70.0). Electronically Signed   By: Donavan Foil M.D.   On: 04/14/2020 15:37    ____________________________________________  PROCEDURES   Procedure(s) performed (including Critical Care):  Procedures  ____________________________________________  INITIAL IMPRESSION / MDM / [place] / ED COURSE  As part of my medical decision making, I reviewed the following data within the [place] notes reviewed and incorporated, Old chart reviewed, Notes from prior ED visits, and [place] Controlled Substance Database       *ERICAH SCOTTO was evaluated in Emergency Department on 04/14/2020 for the symptoms described in the history of present illness. She was evaluated in the context of the  global COVID-19 pandemic, which necessitated consideration that the patient might be at risk for infection with the SARS-CoV-2 virus that causes COVID-19. Institutional protocols and algorithms that pertain to the evaluation of patients at risk for COVID-19 are in a state of rapid change based on information released by regulatory bodies including the CDC and federal and state organizations. These policies and algorithms were followed during the patient's care in the ED.  Some ED evaluations and interventions may be delayed as a result of limited staffing during the pandemic.*     Medical Decision Making:  69 yo F here with abd pain, RLQ, with fatigue. Labs reassuring. Mild BUN:Cr ratio likely 2/2 dehydration - fluids given. Normal WBC. CT scan obtained shows epiploic appendagitis, diverticulosis w/ slightly thickened colon though without stranding. Clinically, pt is adamant that her sx feel similar to her diverticulitis. Feel it's reasonable to treat as such, with course of Avelox given her history and risk factors, as well as age, based on shared decision makign discussion with the pt. She can tolerate NSAIDs so will give motrin as well, supportive care, and good return precautions.  ____________________________________________  FINAL CLINICAL IMPRESSION(S) / ED DIAGNOSES  Final diagnoses:  Epiploic appendagitis  Diverticulitis     MEDICATIONS GIVEN DURING THIS VISIT:  Medications  sodium chloride 0.9 % bolus 1,000 mL (0 mLs Intravenous Stopped 04/14/20 1617)  iohexol (OMNIPAQUE) 300 MG/ML solution 100 mL (100 mLs Intravenous Contrast Given 04/14/20 1514)  morphine 4 MG/ML injection 4 mg (4 mg Intravenous Given  04/14/20 1616)  ondansetron (ZOFRAN) injection 4 mg (4 mg Intravenous Given 04/14/20 1617)     ED Discharge Orders         Ordered    moxifloxacin (AVELOX) 400 MG tablet  Daily,   Status:  Discontinued     Reprint     04/14/20 1726    ondansetron (ZOFRAN ODT) 4 MG disintegrating  tablet  Every 8 hours PRN,   Status:  Discontinued     Reprint     04/14/20 1726    ibuprofen (ADVIL) 600 MG tablet  Every 8 hours PRN,   Status:  Discontinued     Reprint     04/14/20 1726    ibuprofen (ADVIL) 600 MG tablet  Every 8 hours PRN     Discontinue  Reprint     04/14/20 1730    moxifloxacin (AVELOX) 400 MG tablet  Daily     Discontinue  Reprint     04/14/20 1730    ondansetron (ZOFRAN ODT) 4 MG disintegrating tablet  Every 8 hours PRN     Discontinue  Reprint     04/14/20 1730           Note:  This document was prepared using Dragon voice recognition software and may include unintentional dictation errors.   Duffy Bruce, MD 04/14/20 [phone]

## 2020-04-14 NOTE — ED Triage Notes (Signed)
Patient presents to the ED with right lower quadrant pain intermittently since Saturday.  Patient reports some diarrhea over the weekend.  Patient reports body chills but denies fever.  Patient states RLQ is tender to the touch.  Patient states she had diverticulitis 10 years ago and this "feels just like it did then."

## 2020-04-14 NOTE — Discharge Instructions (Addendum)
Your CT scan showed epiploic appendagitis, or inflammation of the fat on the outside of your colon.  This SHOULD improve with time, rest, and taking NSAIDs such as IBUPROFEN (prescribed) and your usual pain medications.  I'd recommend a bland diet for several days as well.  BECAUSE of your history of DIVERTICULITIS, however, we will trial a short course of ANTIBIOTICS for infection  Drink plenty of fluids

## 2020-05-04 ENCOUNTER — Other Ambulatory Visit: Payer: Self-pay | Admitting: Family Medicine

## 2020-05-16 ENCOUNTER — Other Ambulatory Visit: Payer: Self-pay | Admitting: Family Medicine

## 2020-05-26 ENCOUNTER — Other Ambulatory Visit: Payer: Self-pay | Admitting: Family Medicine

## 2020-06-15 ENCOUNTER — Other Ambulatory Visit: Payer: Self-pay | Admitting: Dermatology

## 2020-07-07 ENCOUNTER — Other Ambulatory Visit: Payer: Self-pay | Admitting: Family Medicine

## 2020-07-08 NOTE — Telephone Encounter (Signed)
Last OV was her CPE on 08/24/19 and no future appts. Last filled on 05/06/20 #30 caps with 0 refills

## 2020-07-08 NOTE — Telephone Encounter (Signed)
Please schedule PE and refill until then  

## 2020-07-09 NOTE — Telephone Encounter (Signed)
Tina Berger scheduled CPE 

## 2020-07-11 ENCOUNTER — Encounter: Payer: Self-pay | Admitting: Family Medicine

## 2020-07-17 DIAGNOSIS — M5137 Other intervertebral disc degeneration, lumbosacral region: Secondary | ICD-10-CM | POA: Diagnosis not present

## 2020-07-17 DIAGNOSIS — M4725 Other spondylosis with radiculopathy, thoracolumbar region: Secondary | ICD-10-CM | POA: Diagnosis not present

## 2020-07-17 DIAGNOSIS — Z79891 Long term (current) use of opiate analgesic: Secondary | ICD-10-CM | POA: Diagnosis not present

## 2020-07-17 DIAGNOSIS — G894 Chronic pain syndrome: Secondary | ICD-10-CM | POA: Diagnosis not present

## 2020-07-17 DIAGNOSIS — Z79899 Other long term (current) drug therapy: Secondary | ICD-10-CM | POA: Diagnosis not present

## 2020-07-17 DIAGNOSIS — M961 Postlaminectomy syndrome, not elsewhere classified: Secondary | ICD-10-CM | POA: Diagnosis not present

## 2020-07-22 ENCOUNTER — Encounter: Payer: Self-pay | Admitting: Family Medicine

## 2020-07-22 NOTE — Telephone Encounter (Signed)
See mychart, pt is over 65 so it would be the high dose flu vaccine

## 2020-08-12 ENCOUNTER — Other Ambulatory Visit: Payer: Self-pay | Admitting: Family Medicine

## 2020-08-18 ENCOUNTER — Telehealth: Payer: Self-pay | Admitting: Family Medicine

## 2020-08-18 DIAGNOSIS — E559 Vitamin D deficiency, unspecified: Secondary | ICD-10-CM

## 2020-08-18 DIAGNOSIS — I10 Essential (primary) hypertension: Secondary | ICD-10-CM

## 2020-08-18 DIAGNOSIS — E1169 Type 2 diabetes mellitus with other specified complication: Secondary | ICD-10-CM

## 2020-08-18 DIAGNOSIS — E039 Hypothyroidism, unspecified: Secondary | ICD-10-CM

## 2020-08-18 DIAGNOSIS — Z0000 Encounter for general adult medical examination without abnormal findings: Secondary | ICD-10-CM

## 2020-08-18 DIAGNOSIS — E119 Type 2 diabetes mellitus without complications: Secondary | ICD-10-CM

## 2020-08-18 DIAGNOSIS — E785 Hyperlipidemia, unspecified: Secondary | ICD-10-CM

## 2020-08-18 NOTE — Telephone Encounter (Signed)
-----   Message from Cloyd Stagers, RT sent at 08/07/2020  1:35 PM EDT ----- Regarding: Lab Orders for Wednesday 11.3.2021 Please place lab orders for Wednesday 11.3.2021, office visit for physical on Tuesday 11.9.2021 Thank you, Dyke Maes RT(R)

## 2020-08-20 ENCOUNTER — Other Ambulatory Visit: Payer: Self-pay

## 2020-08-20 ENCOUNTER — Ambulatory Visit (INDEPENDENT_AMBULATORY_CARE_PROVIDER_SITE_OTHER): Payer: Medicare Other

## 2020-08-20 ENCOUNTER — Other Ambulatory Visit (INDEPENDENT_AMBULATORY_CARE_PROVIDER_SITE_OTHER): Payer: Medicare Other

## 2020-08-20 DIAGNOSIS — Z0000 Encounter for general adult medical examination without abnormal findings: Secondary | ICD-10-CM

## 2020-08-20 DIAGNOSIS — E559 Vitamin D deficiency, unspecified: Secondary | ICD-10-CM

## 2020-08-20 DIAGNOSIS — E119 Type 2 diabetes mellitus without complications: Secondary | ICD-10-CM

## 2020-08-20 DIAGNOSIS — E039 Hypothyroidism, unspecified: Secondary | ICD-10-CM | POA: Diagnosis not present

## 2020-08-20 DIAGNOSIS — E1169 Type 2 diabetes mellitus with other specified complication: Secondary | ICD-10-CM

## 2020-08-20 DIAGNOSIS — E785 Hyperlipidemia, unspecified: Secondary | ICD-10-CM

## 2020-08-20 DIAGNOSIS — I10 Essential (primary) hypertension: Secondary | ICD-10-CM

## 2020-08-20 LAB — CBC WITH DIFFERENTIAL/PLATELET
Basophils Absolute: 0.1 10*3/uL (ref 0.0–0.1)
Basophils Relative: 1.3 % (ref 0.0–3.0)
Eosinophils Absolute: 0.6 10*3/uL (ref 0.0–0.7)
Eosinophils Relative: 7.1 % — ABNORMAL HIGH (ref 0.0–5.0)
HCT: 44.6 % (ref 36.0–46.0)
Hemoglobin: 14.7 g/dL (ref 12.0–15.0)
Lymphocytes Relative: 33.8 % (ref 12.0–46.0)
Lymphs Abs: 2.8 10*3/uL (ref 0.7–4.0)
MCHC: 32.9 g/dL (ref 30.0–36.0)
MCV: 88.1 fl (ref 78.0–100.0)
Monocytes Absolute: 0.9 10*3/uL (ref 0.1–1.0)
Monocytes Relative: 11.1 % (ref 3.0–12.0)
Neutro Abs: 3.9 10*3/uL (ref 1.4–7.7)
Neutrophils Relative %: 46.7 % (ref 43.0–77.0)
Platelets: 348 10*3/uL (ref 150.0–400.0)
RBC: 5.06 Mil/uL (ref 3.87–5.11)
RDW: 13.1 % (ref 11.5–15.5)
WBC: 8.2 10*3/uL (ref 4.0–10.5)

## 2020-08-20 LAB — LIPID PANEL
Cholesterol: 168 mg/dL (ref 0–200)
HDL: 42.3 mg/dL (ref >39.00)
LDL Cholesterol: 99 mg/dL (ref 0–99)
NonHDL: 125.57
Total CHOL/HDL Ratio: 4
Triglycerides: 133 mg/dL (ref 0.0–149.0)
VLDL: 26.6 mg/dL (ref 0.0–40.0)

## 2020-08-20 LAB — COMPREHENSIVE METABOLIC PANEL
ALT: 19 U/L (ref 0–35)
AST: 21 U/L (ref 0–37)
Albumin: 4.7 g/dL (ref 3.5–5.2)
Alkaline Phosphatase: 68 U/L (ref 39–117)
BUN: 46 mg/dL — ABNORMAL HIGH (ref 6–23)
CO2: 31 mEq/L (ref 19–32)
Calcium: 9.5 mg/dL (ref 8.4–10.5)
Chloride: 94 mEq/L — ABNORMAL LOW (ref 96–112)
Creatinine, Ser: 1.31 mg/dL — ABNORMAL HIGH (ref 0.40–1.20)
GFR: 41.27 mL/min — ABNORMAL LOW (ref >60.00)
Glucose, Bld: 106 mg/dL — ABNORMAL HIGH (ref 70–99)
Potassium: 4.5 mEq/L (ref 3.5–5.1)
Sodium: 135 mEq/L (ref 135–145)
Total Bilirubin: 0.4 mg/dL (ref 0.2–1.2)
Total Protein: 7.7 g/dL (ref 6.0–8.3)

## 2020-08-20 LAB — HEMOGLOBIN A1C: Hgb A1c MFr Bld: 6.5 % (ref 4.6–6.5)

## 2020-08-20 LAB — VITAMIN D 25 HYDROXY (VIT D DEFICIENCY, FRACTURES): VITD: 48.52 ng/mL (ref 30.00–100.00)

## 2020-08-20 LAB — TSH: TSH: 0.36 u[IU]/mL (ref 0.35–4.50)

## 2020-08-20 NOTE — Progress Notes (Signed)
Subjective:   Tina Berger is a 69 y.o. female who presents for Medicare Annual (Subsequent) preventive examination.  Review of Systems: N/A      I connected with the patient today by telephone and verified that I am speaking with the correct person using two identifiers. Location patient: home Location nurse: work Persons participating in the telephone visit: patient, nurse.   I discussed the limitations, risks, security and privacy concerns of performing an evaluation and management service by telephone and the availability of in person appointments. I also discussed with the patient that there may be a patient responsible charge related to this service. The patient expressed understanding and verbally consented to this telephonic visit.        Cardiac Risk Factors include: advanced age (>72mn, >>31women);diabetes mellitus;Other (see comment), Risk factor comments: hyperlipidemia     Objective:    Today's Vitals   08/20/20 1525  PainSc: 9    There is no height or weight on file to calculate BMI.  Advanced Directives 08/20/2020 04/14/2020 08/16/2019 08/14/2018 10/24/2017 08/10/2017 07/26/2016  Does Patient Have a Medical Advance Directive? Yes No _0   Type of AParamedicof ABelle PlaineLiving will - [place] will [place] will Living will [place] Does patient want to make changes to medical advance directive? - - - - - - No - Patient declined  Copy of [place] Chart? Yes - validated most recent copy scanned in chart (See row information) - No - copy requested No - copy requested - No - copy requested No - copy requested  Would patient like information on creating a medical advance directive? - No - Patient declined - - - - -    Current Medications (verified) Outpatient Encounter Medications as of 08/20/2020   Medication Sig  . buPROPion (WELLBUTRIN XL) 150 MG 24 hr tablet TAKE 1 TABLET BY MOUTH  DAILY  . Chlorphen-Phenyleph-ASA (ALKA-SELTZER PLUS COLD PO) Take by mouth as needed.    . cyclobenzaprine (FLEXERIL) 10 MG tablet TAKE 1/2 TO 1 TABLET BY  MOUTH ONCE DAILY AND 1  TABLET AT BEDTIME  . Diclofenac Sodium 1.5 % SOLN Place 2 mLs onto the skin 4 (four) times daily.  . diphenhydrAMINE (BENADRYL) 25 mg capsule Take 25 mg by mouth as needed.    . DULoxetine (CYMBALTA) 60 MG capsule TAKE 1 CAPSULE BY MOUTH  DAILY  . esomeprazole (NEXIUM) 20 MG capsule TAKE 1 CAPSULE BY MOUTH  DAILY  . famotidine (PEPCID) 40 MG tablet TAKE 1 TABLET BY MOUTH  DAILY  . ferrous sulfate 325 (65 FE) MG EC tablet Take 325 mg by mouth daily with breakfast.  . glucose blood test strip One Touch Ultra stripts blue-To check sugar once daily and as needed for DM2 250.00  . GUAIFENESIN CR PO Take by mouth as needed.    .Marland KitchenHYDROmorphone HCl (EXALGO) 12 MG T24A SR tablet Take 12 mg by mouth daily.  . hyoscyamine (LEVSIN SL) 0.125 MG SL tablet TAKE 1 TABLET (0.125 MG TOTAL) BY MOUTH EVERY 4 (FOUR) HOURS AS NEEDED FOR CRAMPING.  .Marland Kitchenketoconazole (NIZORAL) 2 % shampoo SHAMPOO WITH A SMALL AMOUNT AS DIRECTED THREE TIMES A WEEK SHAMPOO SCALP 3 DAYS A WEEK, LET SHAMPOO SIT FOR 10 MINUTES AND RINSE OFF  . levothyroxine (SYNTHROID) 150 MCG tablet TAKE 1 TABLET BY MOUTH EVERY DAY  . Magnesium  400 MG CAPS Take by mouth daily.   . metFORMIN (GLUCOPHAGE) 500 MG tablet TAKE 1 TABLET BY MOUTH  TWICE DAILY WITH A MEAL  . metoprolol succinate (TOPROL-XL) 50 MG 24 hr tablet TAKE 1 TABLET BY MOUTH  TWICE DAILY TAKE WITH OR  IMMEDIATELY FOLLOWING A  MEAL  . oxyCODONE (OXYCONTIN) 10 MG 12 hr tablet Take 10 mg by mouth 3 (three) times daily as needed.   . Probiotic Product (PROBIOTIC PO) Take 1 capsule by mouth daily.  . ramipril (ALTACE) 5 MG capsule TAKE 1 CAPSULE BY MOUTH  DAILY  . rosuvastatin (CRESTOR) 5 MG tablet TAKE 1 TABLET BY MOUTH  TWICE  WEEKLY  . torsemide (DEMADEX) 20 MG tablet *NEEDS OFFICE VISIT FOR FURTHER REFILLS-PLEASE CALL [phone] TO SCHEDULE.*  . ibuprofen (ADVIL) 600 MG tablet Take 1 tablet (600 mg total) by mouth every 8 (eight) hours as needed for moderate pain.  Marland Kitchen ondansetron (ZOFRAN ODT) 4 MG disintegrating tablet Take 1 tablet (4 mg total) by mouth every 8 (eight) hours as needed for nausea or vomiting.   No facility-administered encounter medications on file as of 08/20/2020.    Allergies (verified) Keflex [cephalexin], Penicillins, Amitriptyline hcl, Atorvastatin, Benicar [olmesartan medoxomil], Ceftin [cefuroxime], Cetirizine hcl, Ciprofloxacin, Clonidine derivatives, Diovan [valsartan], Erythromycin, Etodolac, Fluoxetine hcl, Furosemide, Gabapentin, Naproxen sodium, Paroxetine, Pregabalin, Sulfonamide derivatives, Tetracycline, Venlafaxine, and Cephalexin   History: Past Medical History:  Diagnosis Date  . Acute renal insufficiency 05/31/2014  . Anemia, iron deficiency   . Bilateral lower extremity edema    a. uses torsemide  . Cervical dysplasia    abnormal paps  . Colon cancer screening 06/14/2014  . Cough 08/08/2012  . Degenerative disk disease 11/19/2011  . Diabetes mellitus type II    Diet controlled  . Diverticulosis   . DJD (degenerative joint disease)   . Drug rash 05/22/2011  . Dry eyes   . Edema   . Elevated liver enzymes 03/21/2012  . Encounter for routine gynecological examination 06/14/2014  . ESOPHAGITIS 03/28/2007   Qualifier: Hospitalized for  By: Marcelino Scot CMA, [place] Bilberry    . Fibromyalgia   . GASTRITIS 03/28/2007   Qualifier: History of  By: Marcelino Scot CMA, [place] Bilberry    . GERD (gastroesophageal reflux disease)   . Hemorrhoids    external  . HLD (hyperlipidemia)   . HNP (herniated nucleus pulposus) 2/99   T6,7,8 with DJD  . HTN (hypertension)   . Hyperglycemia 05/13/2008   Qualifier: Diagnosis of  By: Glori Bickers MD, Carmell Austria   . Hypothyroidism   . Interstitial lung disease ([place])     ?  Marland Kitchen Left ovarian cyst    x 3, rupture  . Osteoarthritis    hands  . Osteopenia    mild-11/01; improved 12/05  . Other screening mammogram 08/18/2011  . Palpitations   . PERSONAL HISTORY ALLERGY UNSPEC MEDICINAL AGENT 03/18/2010   Qualifier: Diagnosis of  By: Glori Bickers MD, Carmell Austria   . Raynaud's disease   . Recurrent HSV (herpes simplex virus)    lesions in nose  . Rhinitis 03/21/2012  . Routine general medical examination at a health care facility 03/28/2011   Past Surgical History:  Procedure Laterality Date  . COLONOSCOPY  11/01   Diverticulosis; hemorrhoids  . HAND SURGERY     left thumb  . LASIK     bilateral   Family History  Problem Relation Age of Onset  . Emphysema Father        + smoker  .  Lung cancer Mother        + smoker  . Coronary artery disease Mother        relatively young  . Lymphoma Brother   . Lymphoma Sister   . Diabetes Brother   . Diabetes Sister   . Heart disease Brother   . Anemia Brother        aplactic   . Breast cancer Neg Hx    Social History   Socioeconomic History  . Marital status: Married    Spouse name: Not on file  . Number of children: 1  . Years of education: Not on file  . Highest education level: Not on file  Occupational History  . 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 Smoking status: Former Smoker    Types: Cigarettes  . Smokeless tobacco: Never Used  . Tobacco comment: quit over 40 years  Vaping Use  . Vaping Use: Never used  Substance and Sexual Activity  . Alcohol use: No    Alcohol/week: 0.0 standard drinks  . Drug use: No  . Sexual activity: Yes  Other Topics Concern  . Not on file  Social History Narrative   Married      1 child      Network engineer to Scientist, physiological at [employer]      Does regularly exercise      Daily caffeine use: 2/day         Social Determinants of Health   Financial Resource Strain: Low Risk   . Difficulty of Paying Living Expenses: Not hard at  all  Food Insecurity: No Food Insecurity  . Worried About Charity fundraiser in the Last Year: Never true  . Ran Out of Food in the Last Year: Never true  Transportation Needs: No Transportation Needs  . Lack of Transportation (Medical): No  . Lack of Transportation (Non-Medical): No  Physical Activity: Sufficiently Active  . Days of Exercise per Week: 5 days  . Minutes of Exercise per Session: 60 min  Stress: No Stress Concern Present  . Feeling of Stress : Not at all  Social Connections:   . Frequency of Communication with Friends and Family: Not on file  . Frequency of Social Gatherings with Friends and Family: Not on file  . Attends Religious Services: Not on file  . Active Member of Clubs or Organizations: Not on file  . Attends Archivist Meetings: Not on file  . Marital Status: Not on file    Tobacco Counseling Counseling given: Not Answered Comment: quit over 40 years   Clinical Intake:  Pre-visit preparation completed: Yes  Pain : 0-10 Pain Score: 9  Pain Type: Chronic pain Pain Location: Back Pain Descriptors / Indicators: Aching Pain Onset: More than a month ago Pain Frequency: Intermittent     Nutritional Risks: None Diabetes: Yes CBG done?: No Did pt. bring in CBG monitor from home?: No  How often do you need to have someone help you when you read instructions, pamphlets, or other written materials from your doctor or pharmacy?: 1 - Never What is the last grade level you completed in school?: 1 year of college  Diabetic: Yes Nutrition Risk Assessment:  Has the patient had any N/V/D within the last 2 months?  No  Does the patient have any non-healing wounds?  No  Has the patient had any unintentional weight loss or weight gain?  No   Diabetes:  Is the  patient diabetic?  Yes  If diabetic, was a CBG obtained today?  No  Did the patient bring in their glucometer from home?  N/A, telephone visit How often do you monitor your CBG's? 3  times a week.   Financial Strains and Diabetes Management:  Are you having any financial strains with the device, your supplies or your medication? No .  Does the patient want to be seen by Chronic Care Management for management of their diabetes?  No  Would the patient like to be referred to a Nutritionist or for Diabetic Management?  No   Diabetic Exams:  Diabetic Eye Exam: Completed 09/10/2019 Diabetic Foot Exam: Overdue, Pt has been advised about the importance in completing this exam. Pt is scheduled for diabetic foot exam on 08/26/2020.   Interpreter Needed?: No  Information entered by :: CJohnson, LPN   Activities of Daily Living In your present state of health, do you have any difficulty performing the following activities: 08/20/2020  Hearing? N  Vision? N  Difficulty concentrating or making decisions? N  Walking or climbing stairs? N  Dressing or bathing? N  Doing errands, shopping? N  Preparing Food and eating ? N  Using the Toilet? N  In the past six months, have you accidently leaked urine? Y  Comment wears a pad  Do you have problems with loss of bowel control? N  Managing your Medications? N  Managing your Finances? N  Housekeeping or managing your Housekeeping? N  Some recent data might be hidden    Patient Care Team: Tower, [name], MD as PCP - General [name], [name], MD as Consulting Physician (Cardiology)  Indicate any recent Medical Services you may have received from other than [facility] providers in the past year (date may be approximate).     Assessment:   This is a routine wellness examination for Zuriyah.  Hearing/Vision screen  Hearing Screening   _0  _1  _2  _3  _4  _5  _6  _7  _8   Right ear:           Left ear:           Vision Screening Comments: Patient gets annual eye exams.   Dietary issues and exercise activities discussed: Current Exercise Habits: Home exercise routine, Type of exercise: walking;Other - see  comments (pilates), Time (Minutes): 60, Frequency (Times/Week): 5, Weekly Exercise (Minutes/Week): 300, Intensity: Moderate, Exercise limited by: None identified  Goals    . Increase physical activity     Starting 08/14/2018, I will continue to exercise at least 30 minutes 3-4 days per week.     . Patient Stated     08/16/2019, I will continue to exercise and improve my strength training.     . Patient Stated     08/20/2020, I will continue pilates 2 days a week for 30 minutes and walk 4 days a week for 1 hour.       Depression Screen PHQ 2/9 Scores 08/20/2020 08/16/2019 08/14/2018 11/09/2017 10/24/2017 08/10/2017 07/26/2016  PHQ - 2 Score 0 0 0 0 0 0 0  PHQ- 9 Score 0 0 0 - - 0 -    Fall Risk Fall Risk  08/20/2020 08/16/2019 08/14/2018 11/09/2017 10/24/2017  Falls in the past year? 0 0 No No No  Number falls in past yr: 0 0 - - -  Injury with Fall? 0 0 - - -  Risk for fall due to : Medication side effect Medication side effect - - -  Follow up Falls evaluation completed;Falls  prevention discussed Falls evaluation completed;Falls prevention discussed - - -    Any stairs in or around the home? Yes  If so, are there any without handrails? No  Home free of loose throw rugs in walkways, pet beds, electrical cords, etc? Yes  Adequate lighting in your home to reduce risk of falls? Yes   ASSISTIVE DEVICES UTILIZED TO PREVENT FALLS:  Life alert? No  Use of a cane, walker or w/c? No  Grab bars in the bathroom? No  Shower chair or bench in shower? No  Elevated toilet seat or a handicapped toilet? No   TIMED UP AND GO:  Was the test performed? N/A, telephonic visit .   Cognitive Function: MMSE - Mini Mental State Exam 08/20/2020 08/16/2019 08/14/2018 08/10/2017 07/26/2016  Orientation to time _0 -  Orientation to Place _1 -  Registration _2 -  Attention/ Calculation 5 5 0 0 -  Recall _3 (No Data)  Recall-comments - - - - pt was unable to recall 2 of 3 words    Language- name 2 objects - - 0 0 -  Language- repeat _4 -  Language- follow 3 step command - - 3 3 -  Language- read & follow direction - - 0 0 -  Write a sentence - - 0 0 -  Copy design - - 0 0 -  Total score - - 20 20 -  Mini Cog  Mini-Cog screen was completed. Maximum score is 22. A value of 0 denotes this part of the MMSE was not completed or the patient failed this part of the Mini-Cog screening.       Immunizations Immunization History  Administered Date(s) Administered  . Fluad Quad(high Dose 65+) 08/07/2020  . Influenza Split 06/19/2011  . Influenza Whole 08/01/2009  . Influenza, High Dose Seasonal PF 08/14/2018, 07/03/2019  . Influenza,inj,Quad PF,6+ Mos 06/14/2014, 08/25/2015, 07/26/2016, 08/19/2017  . PFIZER SARS-COV-2 Vaccination 11/27/2019, 12/18/2019  . Pneumococcal Conjugate-13 08/25/2015  . Pneumococcal Polysaccharide-23 08/25/2016  . Td 11/25/2003  . Tdap 06/14/2014  . Zoster 09/16/2014    TDAP status: Up to date Flu Vaccine status: Up to date Pneumococcal vaccine status: Up to date Covid-19 vaccine status: Completed vaccines  Qualifies for Shingles Vaccine? Yes   Zostavax completed Yes   Shingrix Completed?: No.    Education has been provided regarding the importance of this vaccine. Patient has been advised to call insurance company to determine out of pocket expense if they have not yet received this vaccine. Advised may also receive vaccine at local pharmacy or Health Dept. Verbalized acceptance and understanding.  Screening Tests Health Maintenance  Topic Date Due  . FOOT EXAM  08/19/2018  . Fecal DNA (Cologuard)  09/04/2019  . MAMMOGRAM  11/02/2019  . HEMOGLOBIN A1C  02/15/2020  . OPHTHALMOLOGY EXAM  09/09/2020  . TETANUS/TDAP  06/14/2024  . INFLUENZA VACCINE  Completed  . DEXA SCAN  Completed  . COVID-19 Vaccine  Completed  . Hepatitis C Screening  Completed  . PNA vac Low Risk Adult  Completed    Health Maintenance  Health  Maintenance Due  Topic Date Due  . FOOT EXAM  08/19/2018  . Fecal DNA (Cologuard)  09/04/2019  . MAMMOGRAM  11/02/2019  . HEMOGLOBIN A1C  02/15/2020    Colorectal cancer screening: due, wants Cologuard kit ordered Mammogram status: due, Patient will call and schedule appointment  Bone Density status: Completed 11/01/2018.  Results reflect: Bone density results: OSTEOPENIA. Repeat every 2 years.  Lung Cancer Screening: (Low Dose CT Chest recommended if Age 64-80 years, 30 pack-year currently smoking OR have quit w/in 15years.) does not qualify.    Additional Screening:  Hepatitis C Screening: does qualify; Completed 10/19/2011  Vision Screening: Recommended annual ophthalmology exams for early detection of glaucoma and other disorders of the eye. Is the patient up to date with their annual eye exam?  Yes  Who is the provider or what is the name of the office in which the patient attends annual eye exams? [facility]  If pt is not established with a provider, would they like to be referred to a provider to establish care? No .   Dental Screening: Recommended annual dental exams for proper oral hygiene  Community Resource Referral / Chronic Care Management: CRR required this visit?  No   CCM required this visit?  No      Plan:     I have personally reviewed and noted the following in the patient's chart:   . Medical and social history . Use of alcohol, tobacco or illicit drugs  . Current medications and supplements . Functional ability and status . Nutritional status . Physical activity . Advanced directives . List of other physicians . Hospitalizations, surgeries, and ER visits in previous 12 months . Vitals . Screenings to include cognitive, depression, and falls . Referrals and appointments  In addition, I have reviewed and discussed with patient certain preventive protocols, quality metrics, and best practice recommendations. A written personalized care plan  for preventive services as well as general preventive health recommendations were provided to patient.   Due to this being a telephonic visit, the after visit summary with patients personalized plan was offered to patient via office or my-chart.  Patient preferred to pick up at office at next visit or via mychart.   Andrez Grime, LPN   16/10/958

## 2020-08-20 NOTE — Patient Instructions (Signed)
Tina Berger , Thank you for taking time to come for your Medicare Wellness Visit. I appreciate your ongoing commitment to your health goals. Please review the following plan we discussed and let me know if I can assist you in the future.   Screening recommendations/referrals: Colonoscopy: Cologuard due, will complete kit once ordered  Mammogram: due, please call and schedule appointment Bone Density: Up to date, completed 11/01/2018, due 10/2020 Recommended yearly ophthalmology/optometry visit for glaucoma screening and checkup Recommended yearly dental visit for hygiene and checkup  Vaccinations: Influenza vaccine: Up to date, completed 08/07/2020, due 05/2021 Pneumococcal vaccine: Completed series Tdap vaccine: Up to date, completed 06/14/2014, due 05/2024 Shingles vaccine: due, check with your insurance regarding coverage if interested   Covid-19:Completed series  Advanced directives: copy in chart  Conditions/risks identified: diabetes, hyperlipidemia  Next appointment: Follow up in one year for your annual wellness visit    Preventive Care 70 Years and Older, Female Preventive care refers to lifestyle choices and visits with your health care provider that can promote health and wellness. What does preventive care include?  A yearly physical exam. This is also called an annual well check.  Dental exams once or twice a year.  Routine eye exams. Ask your health care provider how often you should have your eyes checked.  Personal lifestyle choices, including:  Daily care of your teeth and gums.  Regular physical activity.  Eating a healthy diet.  Avoiding tobacco and drug use.  Limiting alcohol use.  Practicing safe sex.  Taking low-dose aspirin every day.  Taking vitamin and mineral supplements as recommended by your health care provider. What happens during an annual well check? The services and screenings done by your health care provider during your annual well check  will depend on your age, overall health, lifestyle risk factors, and family history of disease. Counseling  Your health care provider may ask you questions about your:  Alcohol use.  Tobacco use.  Drug use.  Emotional well-being.  Home and relationship well-being.  Sexual activity.  Eating habits.  History of falls.  Memory and ability to understand (cognition).  Work and work Statistician.  Reproductive health. Screening  You may have the following tests or measurements:  Height, weight, and BMI.  Blood pressure.  Lipid and cholesterol levels. These may be checked every 5 years, or more frequently if you are over 48 years old.  Skin check.  Lung cancer screening. You may have this screening every year starting at age 98 if you have a 30-pack-year history of smoking and currently smoke or have quit within the past 15 years.  Fecal occult blood test (FOBT) of the stool. You may have this test every year starting at age 45.  Flexible sigmoidoscopy or colonoscopy. You may have a sigmoidoscopy every 5 years or a colonoscopy every 10 years starting at age 24.  Hepatitis C blood test.  Hepatitis B blood test.  Sexually transmitted disease (STD) testing.  Diabetes screening. This is done by checking your blood sugar (glucose) after you have not eaten for a while (fasting). You may have this done every 1-3 years.  Bone density scan. This is done to screen for osteoporosis. You may have this done starting at age 106.  Mammogram. This may be done every 1-2 years. Talk to your health care provider about how often you should have regular mammograms. Talk with your health care provider about your test results, treatment options, and if necessary, the need for more tests. Vaccines  Your health care provider may recommend certain vaccines, such as:  Influenza vaccine. This is recommended every year.  Tetanus, diphtheria, and acellular pertussis (Tdap, Td) vaccine. You may  need a Td booster every 10 years.  Zoster vaccine. You may need this after age 58.  Pneumococcal 13-valent conjugate (PCV13) vaccine. One dose is recommended after age 26.  Pneumococcal polysaccharide (PPSV23) vaccine. One dose is recommended after age 40. Talk to your health care provider about which screenings and vaccines you need and how often you need them. This information is not intended to replace advice given to you by your health care provider. Make sure you discuss any questions you have with your health care provider. Document Released: 10/31/2015 Document Revised: 06/23/2016 Document Reviewed: 08/05/2015 Elsevier Interactive Patient Education  2017 [place] Prevention in the Home Falls can cause injuries. They can happen to people of all ages. There are many things you can do to make your home safe and to help prevent falls. What can I do on the outside of my home?  Regularly fix the edges of walkways and driveways and fix any cracks.  Remove anything that might make you trip as you walk through a door, such as a raised step or threshold.  Trim any bushes or trees on the path to your home.  Use bright outdoor lighting.  Clear any walking paths of anything that might make someone trip, such as rocks or tools.  Regularly check to see if handrails are loose or broken. Make sure that both sides of any steps have handrails.  Any raised decks and porches should have guardrails on the edges.  Have any leaves, snow, or ice cleared regularly.  Use sand or salt on walking paths during winter.  Clean up any spills in your garage right away. This includes oil or grease spills. What can I do in the bathroom?  Use night lights.  Install grab bars by the toilet and in the tub and shower. Do not use towel bars as grab bars.  Use non-skid mats or decals in the tub or shower.  If you need to sit down in the shower, use a plastic, non-slip stool.  Keep the floor  dry. Clean up any water that spills on the floor as soon as it happens.  Remove soap buildup in the tub or shower regularly.  Attach bath mats securely with double-sided non-slip rug tape.  Do not have throw rugs and other things on the floor that can make you trip. What can I do in the bedroom?  Use night lights.  Make sure that you have a light by your bed that is easy to reach.  Do not use any sheets or blankets that are too big for your bed. They should not hang down onto the floor.  Have a firm chair that has side arms. You can use this for support while you get dressed.  Do not have throw rugs and other things on the floor that can make you trip. What can I do in the kitchen?  Clean up any spills right away.  Avoid walking on wet floors.  Keep items that you use a lot in easy-to-reach places.  If you need to reach something above you, use a strong step stool that has a grab bar.  Keep electrical cords out of the way.  Do not use floor polish or wax that makes floors slippery. If you must use wax, use non-skid floor wax.  Do  not have throw rugs and other things on the floor that can make you trip. What can I do with my stairs?  Do not leave any items on the stairs.  Make sure that there are handrails on both sides of the stairs and use them. Fix handrails that are broken or loose. Make sure that handrails are as long as the stairways.  Check any carpeting to make sure that it is firmly attached to the stairs. Fix any carpet that is loose or worn.  Avoid having throw rugs at the top or bottom of the stairs. If you do have throw rugs, attach them to the floor with carpet tape.  Make sure that you have a light switch at the top of the stairs and the bottom of the stairs. If you do not have them, ask someone to add them for you. What else can I do to help prevent falls?  Wear shoes that:  Do not have high heels.  Have rubber bottoms.  Are comfortable and fit you  well.  Are closed at the toe. Do not wear sandals.  If you use a stepladder:  Make sure that it is fully opened. Do not climb a closed stepladder.  Make sure that both sides of the stepladder are locked into place.  Ask someone to hold it for you, if possible.  Clearly mark and make sure that you can see:  Any grab bars or handrails.  First and last steps.  Where the edge of each step is.  Use tools that help you move around (mobility aids) if they are needed. These include:  Canes.  Walkers.  Scooters.  Crutches.  Turn on the lights when you go into a dark area. Replace any light bulbs as soon as they burn out.  Set up your furniture so you have a clear path. Avoid moving your furniture around.  If any of your floors are uneven, fix them.  If there are any pets around you, be aware of where they are.  Review your medicines with your doctor. Some medicines can make you feel dizzy. This can increase your chance of falling. Ask your doctor what other things that you can do to help prevent falls. This information is not intended to replace advice given to you by your health care provider. Make sure you discuss any questions you have with your health care provider. Document Released: 07/31/2009 Document Revised: 03/11/2016 Document Reviewed: 11/08/2014 Elsevier Interactive Patient Education  2017 [employer].

## 2020-08-20 NOTE — Progress Notes (Signed)
PCP notes:  Health Maintenance: Cologuard- due Mammogram- due Foot exam- due   Abnormal Screenings: none   Patient concerns: Discuss ongoing muscle spasms   Nurse concerns: none   Next PCP appt.: 08/26/2020 @ 10:30 am

## 2020-08-26 ENCOUNTER — Ambulatory Visit (INDEPENDENT_AMBULATORY_CARE_PROVIDER_SITE_OTHER): Payer: Medicare Other | Admitting: Family Medicine

## 2020-08-26 ENCOUNTER — Other Ambulatory Visit: Payer: Self-pay

## 2020-08-26 ENCOUNTER — Encounter: Payer: Self-pay | Admitting: Family Medicine

## 2020-08-26 VITALS — BP 130/78 | HR 81 | Temp 97.6°F | Ht 61.75 in | Wt 197.5 lb

## 2020-08-26 DIAGNOSIS — Z79899 Other long term (current) drug therapy: Secondary | ICD-10-CM

## 2020-08-26 DIAGNOSIS — Z0000 Encounter for general adult medical examination without abnormal findings: Secondary | ICD-10-CM

## 2020-08-26 DIAGNOSIS — I10 Essential (primary) hypertension: Secondary | ICD-10-CM

## 2020-08-26 DIAGNOSIS — M8589 Other specified disorders of bone density and structure, multiple sites: Secondary | ICD-10-CM

## 2020-08-26 DIAGNOSIS — E039 Hypothyroidism, unspecified: Secondary | ICD-10-CM

## 2020-08-26 DIAGNOSIS — R7989 Other specified abnormal findings of blood chemistry: Secondary | ICD-10-CM | POA: Insufficient documentation

## 2020-08-26 DIAGNOSIS — I7300 Raynaud's syndrome without gangrene: Secondary | ICD-10-CM

## 2020-08-26 DIAGNOSIS — Z6836 Body mass index (BMI) 36.0-36.9, adult: Secondary | ICD-10-CM

## 2020-08-26 DIAGNOSIS — F418 Other specified anxiety disorders: Secondary | ICD-10-CM

## 2020-08-26 DIAGNOSIS — E559 Vitamin D deficiency, unspecified: Secondary | ICD-10-CM

## 2020-08-26 DIAGNOSIS — E785 Hyperlipidemia, unspecified: Secondary | ICD-10-CM

## 2020-08-26 DIAGNOSIS — E119 Type 2 diabetes mellitus without complications: Secondary | ICD-10-CM | POA: Diagnosis not present

## 2020-08-26 DIAGNOSIS — R600 Localized edema: Secondary | ICD-10-CM

## 2020-08-26 DIAGNOSIS — E1169 Type 2 diabetes mellitus with other specified complication: Secondary | ICD-10-CM

## 2020-08-26 MED ORDER — ESOMEPRAZOLE MAGNESIUM 20 MG PO CPDR: 20.0000 mg | DELAYED_RELEASE_CAPSULE | Freq: Every day | ORAL | 3 refills | Status: DC

## 2020-08-26 MED ORDER — RAMIPRIL 5 MG PO CAPS: 5.0000 mg | ORAL_CAPSULE | Freq: Every day | ORAL | 3 refills | Status: DC

## 2020-08-26 MED ORDER — LEVOTHYROXINE SODIUM 150 MCG PO TABS: 150.0000 ug | ORAL_TABLET | Freq: Every day | ORAL | 3 refills | Status: DC

## 2020-08-26 MED ORDER — HYOSCYAMINE SULFATE 0.125 MG SL SUBL: 0.1250 mg | SUBLINGUAL_TABLET | SUBLINGUAL | 3 refills | Status: DC | PRN

## 2020-08-26 MED ORDER — TORSEMIDE 20 MG PO TABS: 20.0000 mg | ORAL_TABLET | Freq: Every day | ORAL | 1 refills | Status: DC | PRN

## 2020-08-26 MED ORDER — METFORMIN HCL 500 MG PO TABS: ORAL_TABLET | ORAL | 3 refills | Status: DC

## 2020-08-26 MED ORDER — METOPROLOL SUCCINATE ER 50 MG PO TB24: ORAL_TABLET | ORAL | 3 refills | Status: DC

## 2020-08-26 MED ORDER — ROSUVASTATIN CALCIUM 5 MG PO TABS: ORAL_TABLET | ORAL | 3 refills | Status: DC

## 2020-08-26 MED ORDER — FAMOTIDINE 40 MG PO TABS: 40.0000 mg | ORAL_TABLET | Freq: Every day | ORAL | 3 refills | Status: DC

## 2020-08-26 MED ORDER — BUPROPION HCL ER (XL) 150 MG PO TB24: 150.0000 mg | ORAL_TABLET | Freq: Every day | ORAL | 3 refills | Status: DC

## 2020-08-26 MED ORDER — DULOXETINE HCL 60 MG PO CPEP: 60.0000 mg | ORAL_CAPSULE | Freq: Every day | ORAL | 3 refills | Status: DC

## 2020-08-26 NOTE — Assessment & Plan Note (Signed)
No clinical changes 

## 2020-08-26 NOTE — Assessment & Plan Note (Signed)
Some fatigue Will plan B12 check with next labs

## 2020-08-26 NOTE — Assessment & Plan Note (Signed)
Stable with wellbutrin 150 xl daily and cymbalta 60 mg  Stress is high but handling ok  No current counseling  Stressed imp of exercise and self care

## 2020-08-26 NOTE — Progress Notes (Signed)
Subjective:    Patient ID: Tina Berger, female    DOB: 1951/05/04, 69 y.o.   MRN: [id number]  This visit occurred during the SARS-CoV-2 public health emergency.  Safety protocols were in place, including screening questions prior to the visit, additional usage of staff PPE, and extensive cleaning of exam room while observing appropriate contact time as indicated for disinfecting solutions.    HPI  Here for health maintenance exam and to review chronic medical problems    Wt Readings from Last 3 Encounters:  08/26/20 197 lb 8 oz (89.6 kg)  04/14/20 200 lb (90.7 kg)  08/24/19 200 lb 5 oz (90.9 kg)   36.42 kg/m   Nothing new overall  Feeling good  Taking care of herself  Walking and pilates  (pain is fairly well controlled   Declines colon cancer screening   amw was 11/3 Noted due for mammo and cologuard  Mammogram 1/20-she will call to schedule it  Self breast exam -no lumps   cologuard 11/17 neg   Eye exam 11/20 -has it scheduled at end of the month Has cataracts also -unsure if ready for sx yet   Flu shot 10/21 Had pfizer covid vaccines - is going to get a booster  Had zostavax 1/15 -interested in shingrix if covered   dexa 1/20 stable osteopenia   Falls -none Fractures -none  Supplements - taking vit D  Vit D level 48.5 Exercise - walking and pilates   When she goes to sleep has muscle spasm in her tongue (uses hycosamine) No other symptoms  Very painful when it happens  hycosamine helps at bedtime     HTN bp is stable today  No cp or palpitations or headaches or edema  No side effects to medicines  BP Readings from Last 3 Encounters:  08/26/20 130/78  04/14/20 140/83  08/24/19 122/68    Taking metoprolol xl 50 mg bid  Ramipril 5 mg daily  Torsemide 20 mg   Pulse Readings from Last 3 Encounters:  08/26/20 81  04/14/20 77  08/24/19 73     Diabetes 2 Lab Results  Component Value Date   HGBA1C 6.5 08/20/2020  metformin 500 mg bid   Taking ace Stable  Eating healthy - really making an effort Also exercising   Hypothyroidism  Pt has no clinical changes No change in energy level/ hair or skin/ edema and no tremor Lab Results  Component Value Date   TSH 0.36 08/20/2020     Mood (depression/anx hx) wellbutrin xl 150 daily  cymbalta 60 mg daily   Cholesterol  Lab Results  Component Value Date   CHOL 168 08/20/2020   CHOL 194 08/17/2019   CHOL 215 (H) 08/14/2018   Lab Results  Component Value Date   HDL 42.30 08/20/2020   HDL 40.00 08/17/2019   HDL 45.80 08/14/2018   Lab Results  Component Value Date   LDLCALC 99 08/20/2020   LDLCALC 127 (H) 08/17/2019   LDLCALC 139 (H) 08/14/2018   Lab Results  Component Value Date   TRIG 133.0 08/20/2020   TRIG 139.0 08/17/2019   TRIG 153.0 (H) 08/14/2018   Lab Results  Component Value Date   CHOLHDL 4 08/20/2020   CHOLHDL 5 08/17/2019   CHOLHDL 5 08/14/2018   Lab Results  Component Value Date   LDLDIRECT 149.0 07/26/2016   LDLDIRECT 136.0 08/25/2015   LDLDIRECT 135.6 10/08/2011  intolerant of statin medication but now tolerating crestor 5 mg twice weekly Cut out un  necessary carbs  Red meat once per month max  No fried foods    Has widespread chronic pain  tx -pain clinic  Lab Results  Component Value Date   CREATININE 1.31 (H) 08/20/2020   BUN 46 (H) 08/20/2020   NA 135 08/20/2020   K 4.5 08/20/2020   CL 94 (L) 08/20/2020   CO2 31 08/20/2020  cr is up -never has been  She started taking ibuprofen at bedtime  Does not drink as much water as she used to   She is worried about low B12  Is on ppi  Would like this checked next time   Lab Results  Component Value Date   ALT 19 08/20/2020   AST 21 08/20/2020   ALKPHOS 68 08/20/2020   BILITOT 0.4 08/20/2020    Lab Results  Component Value Date   WBC 8.2 08/20/2020   HGB 14.7 08/20/2020   HCT 44.6 08/20/2020   MCV 88.1 08/20/2020   PLT 348.0 08/20/2020    Patient Active Problem  List   Diagnosis Date Noted  . Elevated serum creatinine 08/26/2020  . Current use of proton pump inhibitor 08/26/2020  . Estrogen deficiency 08/21/2018  . TMJ (dislocation of temporomandibular joint), initial encounter 05/05/2018  . Anterior neck pain 05/05/2018  . Chronic pain syndrome 11/09/2017  . Chronic upper extremity pain [facility] Area of Pain) (Bilateral) (L>R) 11/09/2017  . Fibromyalgia syndrome 11/09/2017  . Osteoarthritis 11/09/2017  . Osteoarthritis of lumbar facet joint (Bilateral) 11/09/2017  . Lumbar facet arthropathy (Bilateral) 11/09/2017  . Lumbar facet syndrome (Bilateral) (L>R) 11/09/2017  . Lumbar foraminal stenosis (multilevel) (Bilateral) 11/09/2017  . DDD (degenerative disc disease), lumbar 11/09/2017  . Thoracic levoscoliosis 11/09/2017  . Thoracic facet syndrome (Bilateral) (L>R) 11/09/2017  . Thoracic facet arthropathy (Bilateral) (R>L) 11/09/2017  . DDD (degenerative disc disease), thoracic 11/09/2017  . Thoracolumbar IVDD 11/09/2017  . DDD (degenerative disc disease), cervical 11/09/2017  . Osteoarthritis of cervical facet (Bilateral) 11/09/2017  . Grade 1 Anterolisthesis of C7 over T1 11/09/2017  . Cervical foraminal stenosis (Bilateral) 11/09/2017  . History of fusion of cervical spine (C5-6 ACDF) 11/09/2017  . Cervical facet syndrome (Bilateral) 11/09/2017  . Disorder of skeletal system 11/09/2017  . Cervical radiculitis (Bilateral) 11/09/2017  . Lumbar Epidural lipomatosis 11/09/2017  . Chronic sacroiliac joint pain (Bilateral) (L>R) 11/09/2017  . Chronic hip pain (Bilateral) (L>R) 11/09/2017  . Chronic upper back pain (Primary Area of Pain) (midline) 10/24/2017  . Chronic neck pain (Secondary Area of Pain) (Bilateral)  (L>R) 10/24/2017  . Chronic low back pain (Fourth Area of Pain) (Bilateral) (L>R) 10/24/2017  . Chronic lower extremity pain (Fifth Area of Pain) (Bilateral) (L>R) 10/24/2017  . Lumbar Grade 1 Retrolisthesis of L1-2 and L2-3  10/24/2017  . Other long term (current) drug therapy 10/24/2017  . Other specified health status 10/24/2017  . Long term current use of opiate analgesic 10/24/2017  . Long term prescription opiate use 10/24/2017  . Opiate use 10/24/2017  . DM type 2 (diabetes mellitus, type 2) ([place]) 06/14/2014  . Pharmacologic therapy 06/14/2014  . Bilateral lower extremity edema   . Post-menopausal 06/23/2012  . Lumbar disc disease with radiculopathy 11/18/2011  . HTN (hypertension) 07/30/2011  . Raynaud disease 07/30/2011  . Obesity 07/30/2011  . Routine general medical examination at a health care facility 03/28/2011  . Palpitations 04/27/2010  . Problems influencing health status 12/11/2009  . Depression with anxiety 06/14/2008  . Vitamin D deficiency 05/13/2008  . ANXIETY 03/25/2008  .  Osteopenia 03/25/2008  . Hypothyroidism 03/28/2007  . Hyperlipidemia associated with type 2 diabetes mellitus ([place]) 03/28/2007  . Other iron deficiency anemias 03/28/2007  . PANIC ATTACK 03/28/2007  . KERATOCONJUNCTIVITIS SICCA 03/28/2007  . Mitral valve disorder 03/28/2007  . ABNORMAL HEART RHYTHMS 03/28/2007  . Raynaud's syndrome 03/28/2007  . GERD 03/28/2007  . IBS 03/28/2007  . Rosacea 03/28/2007  . PLANTAR FASCIITIS 03/28/2007  . MIGRAINES, HX OF 03/28/2007   Past Medical History:  Diagnosis Date  . Acute renal insufficiency 05/31/2014  . Anemia, iron deficiency   . Bilateral lower extremity edema    a. uses torsemide  . Cervical dysplasia    abnormal paps  . Colon cancer screening 06/14/2014  . Cough 08/08/2012  . Degenerative disk disease 11/19/2011  . Diabetes mellitus type II    Diet controlled  . Diverticulosis   . DJD (degenerative joint disease)   . Drug rash 05/22/2011  . Dry eyes   . Edema   . Elevated liver enzymes 03/21/2012  . Encounter for routine gynecological examination 06/14/2014  . ESOPHAGITIS 03/28/2007   Qualifier: Hospitalized for  By: Marcelino Scot CMA, [place]    .  Fibromyalgia   . GASTRITIS 03/28/2007   Qualifier: History of  By: Marcelino Scot CMA, [place] Bilberry    . GERD (gastroesophageal reflux disease)   . Hemorrhoids    external  . HLD (hyperlipidemia)   . HNP (herniated nucleus pulposus) 2/99   T6,7,8 with DJD  . HTN (hypertension)   . Hyperglycemia 05/13/2008   Qualifier: Diagnosis of  By: Glori Bickers MD, Carmell Austria   . Hypothyroidism   . Interstitial lung disease ([place])    ?  Marland Kitchen Left ovarian cyst    x 3, rupture  . Osteoarthritis    hands  . Osteopenia    mild-11/01; improved 12/05  . Other screening mammogram 08/18/2011  . Palpitations   . PERSONAL HISTORY ALLERGY UNSPEC MEDICINAL AGENT 03/18/2010   Qualifier: Diagnosis of  By: Glori Bickers MD, Carmell Austria   . Raynaud's disease   . Recurrent HSV (herpes simplex virus)    lesions in nose  . Rhinitis 03/21/2012  . Routine general medical examination at a health care facility 03/28/2011   Past Surgical History:  Procedure Laterality Date  . COLONOSCOPY  11/01   Diverticulosis; hemorrhoids  . HAND SURGERY     left thumb  . LASIK     bilateral   Social History   Tobacco Use  . Smoking status: Former Smoker    Types: Cigarettes  . Smokeless tobacco: Never Used  . Tobacco comment: quit over 40 years  Vaping Use  . Vaping Use: Never used  Substance Use Topics  . Alcohol use: No    Alcohol/week: 0.0 standard drinks  . Drug use: No   Family History  Problem Relation Age of Onset  . Emphysema Father        + smoker  . Lung cancer Mother        + smoker  . Coronary artery disease Mother        relatively young  . Lymphoma Brother   . Lymphoma Sister   . Diabetes Brother   . Diabetes Sister   . Heart disease Brother   . Anemia Brother        aplactic   . Breast cancer Neg Hx    Allergies  Allergen Reactions  . Keflex [Cephalexin] Anaphylaxis  . Penicillins Anaphylaxis  . Amitriptyline Hcl     REACTION: sedating  .  Atorvastatin     REACTION: muscle  . Benicar [Olmesartan Medoxomil]      Muscle pain   . Ceftin [Cefuroxime]     Swelling, "legs turn blue"  . Cetirizine Hcl     REACTION: headache  . Ciprofloxacin     REACTION: ? rash vs sun rxn  . Clonidine Derivatives     Swelling   . Diovan [Valsartan]     Thought it made her feel confused  . Erythromycin     Rash, swollen gums   . Etodolac     REACTION: reaction not known  . Fluoxetine Hcl     REACTION: stomach problems  . Furosemide     REACTION: swelling  . Gabapentin     REACTION: edema  . Naproxen Sodium     REACTION: edema  . Paroxetine     REACTION: weight gain  . Pregabalin     REACTION: swelling  . Sulfonamide Derivatives     Rash, swollen gums, lips  . Tetracycline     REACTION:inflammed genitals  . Venlafaxine     REACTION: sweating  . Cephalexin Hives and Rash   Current Outpatient Medications on File Prior to Visit  Medication Sig Dispense Refill  . Chlorphen-Phenyleph-ASA (ALKA-SELTZER PLUS COLD PO) Take by mouth as needed.      . cyclobenzaprine (FLEXERIL) 10 MG tablet TAKE 1/2 TO 1 TABLET BY  MOUTH ONCE DAILY AND 1  TABLET AT BEDTIME 180 tablet 1  . Diclofenac Sodium 1.5 % SOLN Place 2 mLs onto the skin 4 (four) times daily. 3 Bottle 3  . diphenhydrAMINE (BENADRYL) 25 mg capsule Take 25 mg by mouth as needed.      . ferrous sulfate 325 (65 FE) MG EC tablet Take 325 mg by mouth daily with breakfast.    . glucose blood test strip One Touch Ultra stripts blue-To check sugar once daily and as needed for DM2 250.00 100 each 3  . GUAIFENESIN CR PO Take by mouth as needed.      Marland Kitchen HYDROmorphone HCl (EXALGO) 12 MG T24A SR tablet Take 12 mg by mouth daily.    Marland Kitchen ketoconazole (NIZORAL) 2 % shampoo SHAMPOO WITH A SMALL AMOUNT AS DIRECTED THREE TIMES A WEEK SHAMPOO SCALP 3 DAYS A WEEK, LET SHAMPOO SIT FOR 10 MINUTES AND RINSE OFF 120 mL 2  . Magnesium 400 MG CAPS Take by mouth daily.     Marland Kitchen oxyCODONE (OXYCONTIN) 10 MG 12 hr tablet Take 10 mg by mouth 3 (three) times daily as needed.     . Probiotic  Product (PROBIOTIC PO) Take 1 capsule by mouth daily.     No current facility-administered medications on file prior to visit.    Review of Systems  Constitutional: Negative for activity change, appetite change, fatigue, fever and unexpected weight change.  HENT: Negative for congestion, ear pain, rhinorrhea, sinus pressure and sore throat.   Eyes: Negative for pain, redness and visual disturbance.  Respiratory: Negative for cough, shortness of breath and wheezing.   Cardiovascular: Negative for chest pain and palpitations.  Gastrointestinal: Negative for abdominal pain, blood in stool, constipation and diarrhea.  Endocrine: Negative for polydipsia and polyuria.  Genitourinary: Negative for dysuria, frequency and urgency.  Musculoskeletal: Positive for arthralgias and back pain. Negative for myalgias.  Skin: Negative for pallor and rash.  Allergic/Immunologic: Negative for environmental allergies.  Neurological: Negative for dizziness, syncope and headaches.  Hematological: Negative for adenopathy. Does not bruise/bleed easily.  Psychiatric/Behavioral: Negative for decreased concentration  and dysphoric mood. The patient is not nervous/anxious.        Objective:   Physical Exam Constitutional:      General: She is not in acute distress.    Appearance: Normal appearance. She is well-developed. She is obese. She is not ill-appearing or diaphoretic.  HENT:     Head: Normocephalic and atraumatic.     Right Ear: Tympanic membrane, ear canal and external ear normal.     Left Ear: Tympanic membrane, ear canal and external ear normal.     Nose: Nose normal. No congestion.     Mouth/Throat:     Mouth: Mucous membranes are moist.     Pharynx: Oropharynx is clear. No posterior oropharyngeal erythema.  Eyes:     General: No scleral icterus.    Extraocular Movements: Extraocular movements intact.     Conjunctiva/sclera: Conjunctivae normal.     Pupils: Pupils are equal, round, and reactive  to light.  Neck:     Thyroid: No thyromegaly.     Vascular: No carotid bruit or JVD.  Cardiovascular:     Rate and Rhythm: Normal rate and regular rhythm.     Pulses: Normal pulses.     Heart sounds: Normal heart sounds. No gallop.   Pulmonary:     Effort: Pulmonary effort is normal. No respiratory distress.     Breath sounds: Normal breath sounds. No wheezing.     Comments: Good air exch Chest:     Chest wall: No tenderness.  Abdominal:     General: Bowel sounds are normal. There is no distension or abdominal bruit.     Palpations: Abdomen is soft. There is no mass.     Tenderness: There is no abdominal tenderness.     Hernia: No hernia is present.  Genitourinary:    Comments: Breast exam: No mass, nodules, thickening, tenderness, bulging, retraction, inflamation, nipple discharge or skin changes noted.  No axillary or clavicular LA.     Musculoskeletal:        General: No tenderness. Normal range of motion.     Cervical back: Normal range of motion and neck supple. No rigidity. No muscular tenderness.     Right lower leg: No edema.     Left lower leg: No edema.  Lymphadenopathy:     Cervical: No cervical adenopathy.  Skin:    General: Skin is warm and dry.     Coloration: Skin is not pale.     Findings: No erythema or rash.     Comments: Solar lentigines diffusely Fair complexion   Rubor and blue skin on toes from raynaud's -baseline    Neurological:     Mental Status: She is alert. Mental status is at baseline.     Cranial Nerves: No cranial nerve deficit.     Motor: No abnormal muscle tone.     Coordination: Coordination normal.     Gait: Gait normal.     Deep Tendon Reflexes: Reflexes are normal and symmetric. Reflexes normal.  Psychiatric:        Mood and Affect: Mood normal.        Cognition and Memory: Cognition and memory normal.           Assessment & Plan:   Problem List Items Addressed This Visit      Cardiovascular and Mediastinum   Raynaud's  syndrome    No clinical changes      Relevant Medications   metoprolol succinate (TOPROL-XL) 50 MG 24 hr tablet  ramipril (ALTACE) 5 MG capsule   rosuvastatin (CRESTOR) 5 MG tablet   torsemide (DEMADEX) 20 MG tablet   HTN (hypertension)    bp in fair control at this time  BP Readings from Last 1 Encounters:  08/26/20 130/78   No changes needed Most recent labs reviewed  Disc lifstyle change with low sodium diet and exercise  Planning to continue metoprolol xl 50 mg bid and ramipril 5 mg daily and torsemide 20 mg daily        Relevant Medications   metoprolol succinate (TOPROL-XL) 50 MG 24 hr tablet   ramipril (ALTACE) 5 MG capsule   rosuvastatin (CRESTOR) 5 MG tablet   torsemide (DEMADEX) 20 MG tablet     Endocrine   Hypothyroidism    Hypothyroidism  Pt has no clinical changes No change in energy level/ hair or skin/ edema and no tremor Lab Results  Component Value Date   TSH 0.36 08/20/2020    Plan to continue levothyroxine 150 mcg daily        Relevant Medications   levothyroxine (SYNTHROID) 150 MCG tablet   metoprolol succinate (TOPROL-XL) 50 MG 24 hr tablet   Hyperlipidemia associated with type 2 diabetes mellitus (HCC)    Disc goals for lipids and reasons to control them Rev last labs with pt Rev low sat fat diet in detail Tolerating crestor 5 mg twice weekly         Relevant Medications   metFORMIN (GLUCOPHAGE) 500 MG tablet   ramipril (ALTACE) 5 MG capsule   rosuvastatin (CRESTOR) 5 MG tablet   DM type 2 (diabetes mellitus, type 2) ([place])    Lab Results  Component Value Date   HGBA1C 6.5 08/20/2020   Taking metformin and ace and statin  utd eye exam  Enc good diet and exercise  F/u 6 mo       Relevant Medications   metFORMIN (GLUCOPHAGE) 500 MG tablet   ramipril (ALTACE) 5 MG capsule   rosuvastatin (CRESTOR) 5 MG tablet     Musculoskeletal and Integument   Osteopenia    utd dexa  No falls or fx Exercising  tx D level   Disc need for  calcium/ vitamin D/ wt bearing exercise and bone density test every 2 y to monitor Disc safety/ fracture risk in detail          Other   Vitamin D deficiency    D level tx at 48.5  Vitamin D level is therapeutic with current supplementation Disc importance of this to bone and overall health  dexa stable 1/20        Depression with anxiety    Stable with wellbutrin 150 xl daily and cymbalta 60 mg  Stress is high but handling ok  No current counseling  Stressed imp of exercise and self care      Relevant Medications   buPROPion (WELLBUTRIN XL) 150 MG 24 hr tablet   DULoxetine (CYMBALTA) 60 MG capsule   Routine general medical examination at a health care facility - Primary    Reviewed health habits including diet and exercise and skin cancer prevention Reviewed appropriate screening tests for age  Also reviewed health mt list, fam hx and immunization status , as well as social and family history   See HPI  amw reviewed  Pt declines colon cancer screening  Mammogram info given-she will schedule her appt  Planning eye exam/ will send a copy  Planning covid booster vaccine  Interested in shingrix if it is  covered  Depression /anxiety is fairly controlled        Obesity    Discussed how this problem influences overall health and the risks it imposes  Reviewed plan for weight loss with lower calorie diet (via better food choices and also portion control or program like weight watchers) and exercise building up to or more than 30 minutes 5 days per week including some aerobic activity         Relevant Medications   metFORMIN (GLUCOPHAGE) 500 MG tablet   Bilateral lower extremity edema    Uses torsemide prn  Labs stable except for inc cr-will watch        Elevated serum creatinine    Cr of 1.31  This is up significantly from baseline Pt has taken ibuprofen lately at bedtime-likely cause Will d/c this and inc water intake If no imp on re check consider holding  torsemide  Labs planned 1 mo      Current use of proton pump inhibitor    Some fatigue Will plan B12 check with next labs

## 2020-08-26 NOTE — Assessment & Plan Note (Signed)
Lab Results  Component Value Date   HGBA1C 6.5 08/20/2020   Taking metformin and ace and statin  utd eye exam  Enc good diet and exercise  F/u 6 mo

## 2020-08-26 NOTE — Assessment & Plan Note (Signed)
D level tx at 48.5  Vitamin D level is therapeutic with current supplementation Disc importance of this to bone and overall health  dexa stable 1/20

## 2020-08-26 NOTE — Assessment & Plan Note (Signed)
Discussed how this problem influences overall health and the risks it imposes  Reviewed plan for weight loss with lower calorie diet (via better food choices and also portion control or program like weight watchers) and exercise building up to or more than 30 minutes 5 days per week including some aerobic activity    

## 2020-08-26 NOTE — Assessment & Plan Note (Signed)
bp in fair control at this time  BP Readings from Last 1 Encounters:  08/26/20 130/78   No changes needed Most recent labs reviewed  Disc lifstyle change with low sodium diet and exercise  Planning to continue metoprolol xl 50 mg bid and ramipril 5 mg daily and torsemide 20 mg daily

## 2020-08-26 NOTE — Assessment & Plan Note (Signed)
utd dexa  No falls or fx Exercising  tx D level   Disc need for calcium/ vitamin D/ wt bearing exercise and bone density test every 2 y to monitor Disc safety/ fracture risk in detail

## 2020-08-26 NOTE — Assessment & Plan Note (Signed)
Hypothyroidism  Pt has no clinical changes No change in energy level/ hair or skin/ edema and no tremor Lab Results  Component Value Date   TSH 0.36 08/20/2020    Plan to continue levothyroxine 150 mcg daily

## 2020-08-26 NOTE — Assessment & Plan Note (Signed)
Disc goals for lipids and reasons to control them Rev last labs with pt Rev low sat fat diet in detail Tolerating crestor 5 mg twice weekly

## 2020-08-26 NOTE — Assessment & Plan Note (Signed)
Cr of 1.31  This is up significantly from baseline Pt has taken ibuprofen lately at bedtime-likely cause Will d/c this and inc water intake If no imp on re check consider holding torsemide  Labs planned 1 mo

## 2020-08-26 NOTE — Assessment & Plan Note (Signed)
Uses torsemide prn  Labs stable except for inc cr-will watch

## 2020-08-26 NOTE — Patient Instructions (Addendum)
Don't forget to call [facility] and schedule your mammogram   Have your eye doctor send [place] your note   Get your covid vaccine booster   If you are interested in the new shingles vaccine (Shingrix) - call your local pharmacy to check on coverage and availability  If affordable, get on a wait list at your pharmacy to get the vaccine.  Please stop ibuprofen Drink lots of water  Schedule labs for a month to re check kidney numbers   Take care of yourself  Stay active

## 2020-08-26 NOTE — Assessment & Plan Note (Signed)
Reviewed health habits including diet and exercise and skin cancer prevention Reviewed appropriate screening tests for age  Also reviewed health mt list, fam hx and immunization status , as well as social and family history   See HPI  amw reviewed  Pt declines colon cancer screening  Mammogram info given-she will schedule her appt  Planning eye exam/ will send a copy  Planning covid booster vaccine  Interested in shingrix if it is covered  Depression Tina Berger is fairly controlled

## 2020-09-08 ENCOUNTER — Telehealth: Payer: Self-pay | Admitting: *Deleted

## 2020-09-08 ENCOUNTER — Encounter: Payer: Self-pay | Admitting: Family Medicine

## 2020-09-08 ENCOUNTER — Other Ambulatory Visit: Payer: Self-pay | Admitting: Family Medicine

## 2020-09-08 NOTE — Telephone Encounter (Signed)
Please have her double the dose of torsemide -take 40 mg daily instead of 20 and update [place] with a response

## 2020-09-08 NOTE — Telephone Encounter (Signed)
Pt notified of Dr. Marliss Coots comments. Pt said that once she stopped the torsemide the swelling instantly came back worse, pt said she did everything in Rena's note to try and help it but it didn't work so she did take the torsemide again and it's still not helping she said the swelling is worse even on the torsemide. Pt notified about the lasix she said it was so long ago she doesn't remember the reaction she had. I did advise her the note said that the lasix caused swelling, pt said that could be right she doesn't remember she is just willing to try anything to help with the swelling, please advise

## 2020-09-08 NOTE — Telephone Encounter (Signed)
Patient left a voicemail stating that she was seen about 2 weeks ago for her physical. Patient stated that she was told to stop taking her diuretic. Patient stated that she almost started swelling immediately after stopping the diuretic. Patient stated that her left foot is swollen considerably more than her right foot.. Patient stated that the diuretic does not seem to be working now. Patient stated that she has been doing everything that she know to do such as drinking lots of fluids and elevating her feet. Patient stated that she does not know if she needs to come back in or does Dr. Glori Bickers want to send in another diuretic for her. Pharmacy- [facility]

## 2020-09-08 NOTE — Telephone Encounter (Signed)
Torsemide is on her med list and we stressed only using as needed if possible to to elevation in kidney labs Unclear from the message-is she swelling off of it or is she still swelling on it?   All diuretics work the kidney harder (lasix is sometimes the exception) but I see that on her intolerance list unfortunately

## 2020-09-08 NOTE — Telephone Encounter (Signed)
CPE on 08/26/20, last filled on 03/25/20 #180 tabs with 1 refill

## 2020-09-09 ENCOUNTER — Other Ambulatory Visit: Payer: Self-pay

## 2020-09-09 MED ORDER — TORSEMIDE 20 MG PO TABS
40.0000 mg | ORAL_TABLET | Freq: Every day | ORAL | 0 refills | Status: DC
Start: 2020-09-09 — End: 2021-01-22

## 2020-09-09 NOTE — Telephone Encounter (Signed)
Pt notified Rx sent to pharmacy. Pt advised of Dr. Marliss Coots comments and instructions and verbalized understanding. Pt will update [place] on how her swelling is doing next week

## 2020-09-09 NOTE — Telephone Encounter (Signed)
I sent that with inst to take 2 pills daily  Unsure if she will need to do this for very long -please ask her to let us know next week how her swelling is  Will likely drop back to 1 pill once under control

## 2020-09-09 NOTE — Telephone Encounter (Signed)
Pt left v/m that pt had requested refill for diuretic to [facility] and it will take 10 days for pt to receive the med. Pt has enough diuretic to last for 3 days. Pt request small qty refill of diuretic sent to [facility] [address] and and please send diuretic to [facility] [address] so it will not be on pts insurance. Pt request cb when done.

## 2020-09-09 NOTE — Telephone Encounter (Signed)
Dr. Glori Bickers sent pt a mychart message letting her know this and pt has viewed it and responded to message so she is aware to double up on med

## 2020-09-09 NOTE — Telephone Encounter (Signed)
I know dose is being changed will route to PCP to send in how ever much med she wants pt to be on, please send in 10 day supply

## 2020-09-15 DIAGNOSIS — H2511 Age-related nuclear cataract, right eye: Secondary | ICD-10-CM | POA: Diagnosis not present

## 2020-09-15 LAB — HM DIABETES EYE EXAM

## 2020-09-17 ENCOUNTER — Encounter: Payer: Self-pay | Admitting: Family Medicine

## 2020-09-23 ENCOUNTER — Encounter: Payer: Self-pay | Admitting: Family Medicine

## 2020-09-25 ENCOUNTER — Other Ambulatory Visit: Payer: Self-pay

## 2020-09-25 ENCOUNTER — Other Ambulatory Visit (INDEPENDENT_AMBULATORY_CARE_PROVIDER_SITE_OTHER): Payer: Medicare Other

## 2020-09-25 DIAGNOSIS — Z79899 Other long term (current) drug therapy: Secondary | ICD-10-CM | POA: Diagnosis not present

## 2020-09-25 DIAGNOSIS — R7989 Other specified abnormal findings of blood chemistry: Secondary | ICD-10-CM

## 2020-09-25 LAB — VITAMIN B12: Vitamin B-12: 740 pg/mL (ref 211–911)

## 2020-09-25 LAB — BASIC METABOLIC PANEL
BUN: 21 mg/dL (ref 6–23)
CO2: 33 mEq/L — ABNORMAL HIGH (ref 19–32)
Calcium: 9.2 mg/dL (ref 8.4–10.5)
Chloride: 99 mEq/L (ref 96–112)
Creatinine, Ser: 0.92 mg/dL (ref 0.40–1.20)
GFR: 63.03 mL/min (ref >60.00)
Glucose, Bld: 94 mg/dL (ref 70–99)
Potassium: 4.7 mEq/L (ref 3.5–5.1)
Sodium: 139 mEq/L (ref 135–145)

## 2020-10-02 DIAGNOSIS — M4725 Other spondylosis with radiculopathy, thoracolumbar region: Secondary | ICD-10-CM | POA: Diagnosis not present

## 2020-10-02 DIAGNOSIS — Z79899 Other long term (current) drug therapy: Secondary | ICD-10-CM | POA: Diagnosis not present

## 2020-10-02 DIAGNOSIS — M5137 Other intervertebral disc degeneration, lumbosacral region: Secondary | ICD-10-CM | POA: Diagnosis not present

## 2020-10-02 DIAGNOSIS — G894 Chronic pain syndrome: Secondary | ICD-10-CM | POA: Diagnosis not present

## 2020-10-02 DIAGNOSIS — M961 Postlaminectomy syndrome, not elsewhere classified: Secondary | ICD-10-CM | POA: Diagnosis not present

## 2020-10-02 DIAGNOSIS — Z79891 Long term (current) use of opiate analgesic: Secondary | ICD-10-CM | POA: Diagnosis not present

## 2020-10-23 ENCOUNTER — Ambulatory Visit (INDEPENDENT_AMBULATORY_CARE_PROVIDER_SITE_OTHER): Payer: Medicare Other | Admitting: Family Medicine

## 2020-10-23 ENCOUNTER — Encounter: Payer: Self-pay | Admitting: Family Medicine

## 2020-10-23 ENCOUNTER — Other Ambulatory Visit: Payer: Self-pay

## 2020-10-23 VITALS — BP 128/84 | HR 92 | Temp 98.6°F | Ht 61.75 in | Wt 204.0 lb

## 2020-10-23 DIAGNOSIS — M2550 Pain in unspecified joint: Secondary | ICD-10-CM | POA: Diagnosis not present

## 2020-10-23 DIAGNOSIS — R600 Localized edema: Secondary | ICD-10-CM

## 2020-10-23 DIAGNOSIS — I7300 Raynaud's syndrome without gangrene: Secondary | ICD-10-CM

## 2020-10-23 DIAGNOSIS — M8949 Other hypertrophic osteoarthropathy, multiple sites: Secondary | ICD-10-CM

## 2020-10-23 DIAGNOSIS — M150 Primary generalized (osteo)arthritis: Secondary | ICD-10-CM

## 2020-10-23 DIAGNOSIS — M159 Polyosteoarthritis, unspecified: Secondary | ICD-10-CM

## 2020-10-23 MED ORDER — PREDNISONE 10 MG PO TABS: ORAL_TABLET | ORAL | 0 refills | Status: DC

## 2020-10-23 NOTE — Progress Notes (Signed)
Patient ID: CACY PHIN, female    DOB: Jul 18, 1951, 70 y.o.   MRN: [id number]  This visit was conducted in person.  BP 128/84   Pulse 92   Temp 98.6 F (37 C) (Temporal)   Ht 5' 1.75" (1.568 m)   Wt 204 lb (92.5 kg)   SpO2 90%   BMI 37.61 kg/m    CC: swelling in ankles and hands Subjective:   HPI: Tina Berger is a 70 y.o. female presenting on 10/23/2020 for Edema (Ankles & Hands)   70 year old female patient of Dr. Marliss Coots with history of chronic pain treated with narcotics, fibromyalgia, hypothyroidism, anemia, DM and HTN presents with  Worsening of swelling and pain in hands, ankles and feet since November.  Stopped antiinflammatory ( 800 mg ibuprofen) since last  CPX as GFR decreased to 41 per PCP.Marland Kitchenafter that she has noted swelling and pain in fingers and feet.  She increased torsemide 2 a day for 2 weeks on Dr Marliss Coots instructions given increase in swelling. Repeat BMET showed  GFR 63   She has started ibuprofen back as feet and hands very apinful.  Compression hose make her feet hurt more  Has not been elevating legs.  She is interested in seeing a rheumatologist as she feels she has an inflammation issue. Saw Precious Reel as well as Dr. Estanislado Pandy  in past.  Hx of chronic lower extremity swelling..reivewed noted from Dr. [name] 06/20/2018 Echocardiogram in October 2008 was essentially normal. Stress test in October 2008 showed no ischemia. This is a exercise treadmill.    Wt Readings from Last 3 Encounters:  10/23/20 204 lb (92.5 kg)  08/26/20 197 lb 8 oz (89.6 kg)  04/14/20 200 lb (90.7 kg)   BP Readings from Last 3 Encounters:  10/23/20 128/84  08/26/20 130/78  04/14/20 140/83      Relevant past medical, surgical, family and social history reviewed and updated as indicated. Interim medical history since our last visit reviewed. Allergies and medications reviewed and updated. Outpatient Medications Prior to Visit  Medication Sig Dispense Refill  .  buPROPion (WELLBUTRIN XL) 150 MG 24 hr tablet Take 1 tablet (150 mg total) by mouth daily. 90 tablet 3  . Chlorphen-Phenyleph-ASA (ALKA-SELTZER PLUS COLD PO) Take by mouth as needed.    . cyclobenzaprine (FLEXERIL) 10 MG tablet TAKE 1/2 TO 1 TABLET BY  MOUTH ONCE DAILY AND 1  TABLET AT BEDTIME 180 tablet 1  . Diclofenac Sodium 1.5 % SOLN Place 2 mLs onto the skin 4 (four) times daily. 3 Bottle 3  . diphenhydrAMINE (BENADRYL) 25 mg capsule Take 25 mg by mouth as needed.    . DULoxetine (CYMBALTA) 60 MG capsule Take 1 capsule (60 mg total) by mouth daily. 90 capsule 3  . esomeprazole (NEXIUM) 20 MG capsule Take 1 capsule (20 mg total) by mouth daily. 90 capsule 3  . famotidine (PEPCID) 40 MG tablet Take 1 tablet (40 mg total) by mouth daily. 90 tablet 3  . ferrous sulfate 325 (65 FE) MG EC tablet Take 325 mg by mouth daily with breakfast.    . glucose blood test strip One Touch Ultra stripts blue-To check sugar once daily and as needed for DM2 250.00 100 each 3  . GUAIFENESIN CR PO Take by mouth as needed.    Marland Kitchen HYDROmorphone HCl (EXALGO) 12 MG T24A SR tablet Take 12 mg by mouth daily.    . hyoscyamine (LEVSIN SL) 0.125 MG SL tablet Take 1  tablet (0.125 mg total) by mouth every 4 (four) hours as needed for cramping. 270 tablet 3  . ketoconazole (NIZORAL) 2 % shampoo SHAMPOO WITH A SMALL AMOUNT AS DIRECTED THREE TIMES A WEEK SHAMPOO SCALP 3 DAYS A WEEK, LET SHAMPOO SIT FOR 10 MINUTES AND RINSE OFF 120 mL 2  . levothyroxine (SYNTHROID) 150 MCG tablet Take 1 tablet (150 mcg total) by mouth daily. 90 tablet 3  . Magnesium 400 MG CAPS Take by mouth daily.    . metFORMIN (GLUCOPHAGE) 500 MG tablet TAKE 1 TABLET BY MOUTH  TWICE DAILY WITH A MEAL 180 tablet 3  . metoprolol succinate (TOPROL-XL) 50 MG 24 hr tablet TAKE 1 TABLET BY MOUTH  TWICE DAILY TAKE WITH OR  IMMEDIATELY FOLLOWING A  MEAL 180 tablet 3  . oxyCODONE (OXYCONTIN) 10 MG 12 hr tablet Take 10 mg by mouth 3 (three) times daily as needed.     .  Probiotic Product (PROBIOTIC PO) Take 1 capsule by mouth daily.    . ramipril (ALTACE) 5 MG capsule Take 1 capsule (5 mg total) by mouth daily. 90 capsule 3  . rosuvastatin (CRESTOR) 5 MG tablet TAKE 1 TABLET BY MOUTH  TWICE WEEKLY 26 tablet 3  . torsemide (DEMADEX) 20 MG tablet Take 2 tablets (40 mg total) by mouth daily. 26 tablet 0   No facility-administered medications prior to visit.     Per HPI unless specifically indicated in ROS section below Review of Systems  Constitutional: Negative for fatigue and fever.  HENT: Negative for congestion.   Eyes: Negative for pain.  Respiratory: Negative for cough and shortness of breath.   Cardiovascular: Positive for leg swelling. Negative for chest pain and palpitations.  Gastrointestinal: Negative for abdominal pain.  Genitourinary: Negative for dysuria and vaginal bleeding.  Musculoskeletal: Positive for joint swelling. Negative for back pain.  Neurological: Negative for syncope, light-headedness and headaches.  Psychiatric/Behavioral: Negative for dysphoric mood.   Objective:  BP 128/84   Pulse 92   Temp 98.6 F (37 C) (Temporal)   Ht 5' 1.75" (1.568 m)   Wt 204 lb (92.5 kg)   SpO2 90%   BMI 37.61 kg/m   Wt Readings from Last 3 Encounters:  10/23/20 204 lb (92.5 kg)  08/26/20 197 lb 8 oz (89.6 kg)  04/14/20 200 lb (90.7 kg)      Physical Exam Constitutional:      General: She is not in acute distress.Vital signs are normal.     Appearance: Normal appearance. She is well-developed and well-nourished. She is not ill-appearing or toxic-appearing.  HENT:     Head: Normocephalic.     Right Ear: Hearing, tympanic membrane, ear canal and external ear normal. Tympanic membrane is not erythematous, retracted or bulging.     Left Ear: Hearing, tympanic membrane, ear canal and external ear normal. Tympanic membrane is not erythematous, retracted or bulging.     Nose: No mucosal edema or rhinorrhea.     Right Sinus: No maxillary sinus  tenderness or frontal sinus tenderness.     Left Sinus: No maxillary sinus tenderness or frontal sinus tenderness.     Mouth/Throat:     Mouth: Oropharynx is clear and moist and mucous membranes are normal.     Pharynx: Uvula midline.  Eyes:     General: Lids are normal. Lids are everted, no foreign bodies appreciated.     Extraocular Movements: EOM normal.     Conjunctiva/sclera: Conjunctivae normal.     Pupils: Pupils are  equal, round, and reactive to light.  Neck:     Thyroid: No thyroid mass or thyromegaly.     Vascular: No carotid bruit.     Trachea: Trachea normal.  Cardiovascular:     Rate and Rhythm: Normal rate and regular rhythm.     Pulses: Normal pulses and intact distal pulses.     Heart sounds: Normal heart sounds, S1 normal and S2 normal. No murmur heard. No friction rub. No gallop.   Pulmonary:     Effort: Pulmonary effort is normal. No tachypnea or respiratory distress.     Breath sounds: Normal breath sounds. No decreased breath sounds, wheezing, rhonchi or rales.  Abdominal:     General: Bowel sounds are normal.     Palpations: Abdomen is soft.     Tenderness: There is no abdominal tenderness.  Musculoskeletal:     Cervical back: Normal range of motion and neck supple.     Right lower leg: Edema present.     Left lower leg: Edema present.  Skin:    General: Skin is warm, dry and intact.     Findings: No rash.  Neurological:     Mental Status: She is alert.  Psychiatric:        Mood and Affect: Mood is not anxious or depressed.        Speech: Speech normal.        Behavior: Behavior normal. Behavior is cooperative.        Thought Content: Thought content normal.        Cognition and Memory: Cognition and memory normal.        Judgment: Judgment normal.       Results for orders placed or performed in visit on 09/25/20  Vitamin B12  Result Value Ref Range   Vitamin B-12 740 211 - 911 pg/mL  Basic metabolic panel  Result Value Ref Range   Sodium 139  135 - 145 mEq/L   Potassium 4.7 3.5 - 5.1 mEq/L   Chloride 99 96 - 112 mEq/L   CO2 33 (H) 19 - 32 mEq/L   Glucose, Bld 94 70 - 99 mg/dL   BUN 21 6 - 23 mg/dL   Creatinine, Ser 4.09 0.40 - 1.20 mg/dL   GFR 81.19 >14.78 mL/min   Calcium 9.2 8.4 - 10.5 mg/dL    This visit occurred during the SARS-CoV-2 public health emergency.  Safety protocols were in place, including screening questions prior to the visit, additional usage of staff PPE, and extensive cleaning of exam room while observing appropriate contact time as indicated for disinfecting solutions.   COVID 19 screen:  No recent travel or known exposure to COVID19 The patient denies respiratory symptoms of COVID 19 at this time. The importance of social distancing was discussed today.   Assessment and Plan    Problem List Items Addressed This Visit    Joint pain - Primary   Osteoarthritis (Chronic)    Prednisone taper to treat arthralgia.Marland Kitchen likely due to OA.      Pedal edema    Elevate legs above heart We can consider increasing dose of torsemide if need or consider careful use of low dose meloxicam if joint pain and swelling is not improving. .       Raynaud's syndrome      Meds ordered this encounter  Medications  . DISCONTD: predniSONE (DELTASONE) 10 MG tablet    Sig: 3 tabs by mouth daily x 4 days, then 2 tabs by mouth daily  x 4 days then 1 tab by mouth daily x 2 days    Dispense:  22 tablet    Refill:  0     Eliezer Lofts, MD

## 2020-10-23 NOTE — Patient Instructions (Addendum)
Elevate legs above heart.   Start prednisone taper x  10 days.  Stop ibuprofen.  Follow blood sugars at home.. on prednisone.. call if CBGs running higher than > 250.  We can consider increasing dose of torsemide if need or consider careful use of low dose meloxicam if joint pain and swelling is not improving.

## 2020-11-06 ENCOUNTER — Ambulatory Visit: Payer: Medicare Other | Admitting: Family Medicine

## 2020-11-13 ENCOUNTER — Encounter: Payer: Self-pay | Admitting: Family Medicine

## 2020-11-13 ENCOUNTER — Other Ambulatory Visit: Payer: Self-pay

## 2020-11-13 ENCOUNTER — Ambulatory Visit (INDEPENDENT_AMBULATORY_CARE_PROVIDER_SITE_OTHER): Payer: Medicare Other | Admitting: Family Medicine

## 2020-11-13 VITALS — BP 114/68 | HR 83 | Temp 97.3°F | Ht 61.75 in | Wt 207.1 lb

## 2020-11-13 DIAGNOSIS — R600 Localized edema: Secondary | ICD-10-CM

## 2020-11-13 DIAGNOSIS — M2550 Pain in unspecified joint: Secondary | ICD-10-CM | POA: Insufficient documentation

## 2020-11-13 DIAGNOSIS — M25541 Pain in joints of right hand: Secondary | ICD-10-CM

## 2020-11-13 DIAGNOSIS — Z6838 Body mass index (BMI) 38.0-38.9, adult: Secondary | ICD-10-CM

## 2020-11-13 DIAGNOSIS — M25542 Pain in joints of left hand: Secondary | ICD-10-CM | POA: Diagnosis not present

## 2020-11-13 DIAGNOSIS — I7300 Raynaud's syndrome without gangrene: Secondary | ICD-10-CM | POA: Diagnosis not present

## 2020-11-13 LAB — SEDIMENTATION RATE: Sed Rate: 72 mm/hr — ABNORMAL HIGH (ref 0–30)

## 2020-11-13 LAB — CBC WITH DIFFERENTIAL/PLATELET
Basophils Absolute: 0.1 10*3/uL (ref 0.0–0.1)
Basophils Relative: 1.2 % (ref 0.0–3.0)
Eosinophils Absolute: 0.4 10*3/uL (ref 0.0–0.7)
Eosinophils Relative: 7 % — ABNORMAL HIGH (ref 0.0–5.0)
HCT: 38.1 % (ref 36.0–46.0)
Hemoglobin: 12.4 g/dL (ref 12.0–15.0)
Lymphocytes Relative: 24.4 % (ref 12.0–46.0)
Lymphs Abs: 1.3 10*3/uL (ref 0.7–4.0)
MCHC: 32.6 g/dL (ref 30.0–36.0)
MCV: 87.6 fl (ref 78.0–100.0)
Monocytes Absolute: 0.7 10*3/uL (ref 0.1–1.0)
Monocytes Relative: 12.6 % — ABNORMAL HIGH (ref 3.0–12.0)
Neutro Abs: 3 10*3/uL (ref 1.4–7.7)
Neutrophils Relative %: 54.8 % (ref 43.0–77.0)
Platelets: 286 10*3/uL (ref 150.0–400.0)
RBC: 4.35 Mil/uL (ref 3.87–5.11)
RDW: 13.6 % (ref 11.5–15.5)
WBC: 5.4 10*3/uL (ref 4.0–10.5)

## 2020-11-13 LAB — BASIC METABOLIC PANEL
BUN: 26 mg/dL — ABNORMAL HIGH (ref 6–23)
CO2: 33 mEq/L — ABNORMAL HIGH (ref 19–32)
Calcium: 9 mg/dL (ref 8.4–10.5)
Chloride: 101 mEq/L (ref 96–112)
Creatinine, Ser: 1.15 mg/dL (ref 0.40–1.20)
GFR: 48.18 mL/min — ABNORMAL LOW (ref >60.00)
Glucose, Bld: 101 mg/dL — ABNORMAL HIGH (ref 70–99)
Potassium: 4.9 mEq/L (ref 3.5–5.1)
Sodium: 138 mEq/L (ref 135–145)

## 2020-11-13 LAB — C-REACTIVE PROTEIN: CRP: 2.9 mg/dL (ref 0.5–20.0)

## 2020-11-13 NOTE — Assessment & Plan Note (Signed)
Pain in multiple joints (mostly hands) recently worsened  occ red/warn joint (not today) No deformity  Marked improvement with prednisone  Now symptoms improving Avoiding nsaids in light of inc GFR recently (now back to baseline)  Adv use of voltaren gel  Labs for autoimmune jt dz today  Feel she may have a seronegative auto immune disease at this point

## 2020-11-13 NOTE — Assessment & Plan Note (Signed)
This is fairly stable Reviewed last labs

## 2020-11-13 NOTE — Assessment & Plan Note (Signed)
No doubt this worsens joint pain  Discussed how this problem influences overall health and the risks it imposes  Reviewed plan for weight loss with lower calorie diet (via better food choices and also portion control or program like weight watchers) and exercise building up to or more than 30 minutes 5 days per week including some aerobic activity

## 2020-11-13 NOTE — Patient Instructions (Addendum)
Labs today for autoimmune joint problems  Plan to follow   Use the voltaren gel/cream as needed up to 4 times per day   Keep moving

## 2020-11-13 NOTE — Assessment & Plan Note (Signed)
Stable  Marked color change in feet

## 2020-11-13 NOTE — Progress Notes (Signed)
Subjective:    Patient ID: Tina Berger, female    DOB: 04-22-1951, 70 y.o.   MRN: [id number]  This visit occurred during the SARS-CoV-2 public health emergency.  Safety protocols were in place, including screening questions prior to the visit, additional usage of staff PPE, and extensive cleaning of exam room while observing appropriate contact time as indicated for disinfecting solutions.    HPI Pt presents with swelling of hands and feet   Wt Readings from Last 3 Encounters:  11/13/20 207 lb 2 oz (94 kg)  10/23/20 204 lb (92.5 kg)  08/26/20 197 lb 8 oz (89.6 kg)   38.19 kg/m   She saw Dr Diona Browner for this on 1/6   Prev on nsaids and GFR went down  Then inc torsemide 2 a day for 2 weeks , GFR did return to 63 Started ibuprofen back ad hands/feet were painful  H/o raynauds also  Pt thinks she has more of a pain problem then a fluid retention problem   Joints hurt  Wrists , hands  Knees   occ she gets a joint that is red/hot  Feet- if she wears a closed shoe   Raynaud's is the same    No new neck and spine pain (has baseline deg changes)   Compression stockings- did not tolerate/made pain worse   Has seen rheum in the past- Jefm Bryant and Deveshwar Also cardiology for LE swelling  Dr Diona Browner treated her with prednisone taper for 10 days  That helped immensely   Took some ibuprofen also   Lab Results  Component Value Date   CREATININE 0.92 09/25/2020   BUN 21 09/25/2020   NA 139 09/25/2020   K 4.7 09/25/2020   CL 99 09/25/2020   CO2 33 (H) 09/25/2020   Lab Results  Component Value Date   TSH 0.36 08/20/2020   Lab Results  Component Value Date   WBC 8.2 08/20/2020   HGB 14.7 08/20/2020   HCT 44.6 08/20/2020   MCV 88.1 08/20/2020   PLT 348.0 08/20/2020   BP Readings from Last 3 Encounters:  11/13/20 114/68  10/23/20 128/84  08/26/20 130/78   Pulse Readings from Last 3 Encounters:  11/13/20 83  10/23/20 92  08/26/20 81   Ramipril 5 mg  daily  Metoprolol xl 50 mg bid  Last rheumatology testing was 2013 ? If she has sero negative auto immune joint dz   Has used diclofenac topical in the past   Patient Active Problem List   Diagnosis Date Noted  . Joint pain 11/13/2020  . Elevated serum creatinine 08/26/2020  . Current use of proton pump inhibitor 08/26/2020  . Estrogen deficiency 08/21/2018  . TMJ (dislocation of temporomandibular joint), initial encounter 05/05/2018  . Anterior neck pain 05/05/2018  . Chronic pain syndrome 11/09/2017  . Chronic upper extremity pain [facility] Area of Pain) (Bilateral) (L>R) 11/09/2017  . Fibromyalgia syndrome 11/09/2017  . Osteoarthritis 11/09/2017  . Osteoarthritis of lumbar facet joint (Bilateral) 11/09/2017  . Lumbar facet arthropathy (Bilateral) 11/09/2017  . Lumbar facet syndrome (Bilateral) (L>R) 11/09/2017  . Lumbar foraminal stenosis (multilevel) (Bilateral) 11/09/2017  . DDD (degenerative disc disease), lumbar 11/09/2017  . Thoracic levoscoliosis 11/09/2017  . Thoracic facet syndrome (Bilateral) (L>R) 11/09/2017  . Thoracic facet arthropathy (Bilateral) (R>L) 11/09/2017  . DDD (degenerative disc disease), thoracic 11/09/2017  . Thoracolumbar IVDD 11/09/2017  . DDD (degenerative disc disease), cervical 11/09/2017  . Osteoarthritis of cervical facet (Bilateral) 11/09/2017  . Grade 1 Anterolisthesis of C7  over T1 11/09/2017  . Cervical foraminal stenosis (Bilateral) 11/09/2017  . History of fusion of cervical spine (C5-6 ACDF) 11/09/2017  . Cervical facet syndrome (Bilateral) 11/09/2017  . Disorder of skeletal system 11/09/2017  . Cervical radiculitis (Bilateral) 11/09/2017  . Lumbar Epidural lipomatosis 11/09/2017  . Chronic sacroiliac joint pain (Bilateral) (L>R) 11/09/2017  . Chronic hip pain (Bilateral) (L>R) 11/09/2017  . Chronic upper back pain (Primary Area of Pain) (midline) 10/24/2017  . Chronic neck pain (Secondary Area of Pain) (Bilateral)  (L>R) 10/24/2017   . Chronic low back pain (Fourth Area of Pain) (Bilateral) (L>R) 10/24/2017  . Chronic lower extremity pain (Fifth Area of Pain) (Bilateral) (L>R) 10/24/2017  . Lumbar Grade 1 Retrolisthesis of L1-2 and L2-3 10/24/2017  . Other long term (current) drug therapy 10/24/2017  . Other specified health status 10/24/2017  . Long term current use of opiate analgesic 10/24/2017  . Long term prescription opiate use 10/24/2017  . Opiate use 10/24/2017  . DM type 2 (diabetes mellitus, type 2) ([place]) 06/14/2014  . Pharmacologic therapy 06/14/2014  . Pedal edema   . Post-menopausal 06/23/2012  . Lumbar disc disease with radiculopathy 11/18/2011  . HTN (hypertension) 07/30/2011  . Raynaud disease 07/30/2011  . Obesity 07/30/2011  . Routine general medical examination at a health care facility 03/28/2011  . Palpitations 04/27/2010  . Problems influencing health status 12/11/2009  . Depression with anxiety 06/14/2008  . Vitamin D deficiency 05/13/2008  . ANXIETY 03/25/2008  . Osteopenia 03/25/2008  . Hypothyroidism 03/28/2007  . Hyperlipidemia associated with type 2 diabetes mellitus ([place]) 03/28/2007  . Other iron deficiency anemias 03/28/2007  . PANIC ATTACK 03/28/2007  . KERATOCONJUNCTIVITIS SICCA 03/28/2007  . Mitral valve disorder 03/28/2007  . ABNORMAL HEART RHYTHMS 03/28/2007  . Raynaud's syndrome 03/28/2007  . GERD 03/28/2007  . IBS 03/28/2007  . Rosacea 03/28/2007  . PLANTAR FASCIITIS 03/28/2007  . MIGRAINES, HX OF 03/28/2007   Past Medical History:  Diagnosis Date  . Acute renal insufficiency 05/31/2014  . Anemia, iron deficiency   . Bilateral lower extremity edema    a. uses torsemide  . Cervical dysplasia    abnormal paps  . Colon cancer screening 06/14/2014  . Cough 08/08/2012  . Degenerative disk disease 11/19/2011  . Diabetes mellitus type II    Diet controlled  . Diverticulosis   . DJD (degenerative joint disease)   . Drug rash 05/22/2011  . Dry eyes   . Edema   .  Elevated liver enzymes 03/21/2012  . Encounter for routine gynecological examination 06/14/2014  . ESOPHAGITIS 03/28/2007   Qualifier: Hospitalized for  By: Marcelino Scot CMA, [place]    . Fibromyalgia   . GASTRITIS 03/28/2007   Qualifier: History of  By: Marcelino Scot CMA, [place]    . GERD (gastroesophageal reflux disease)   . Hemorrhoids    external  . HLD (hyperlipidemia)   . HNP (herniated nucleus pulposus) 2/99   T6,7,8 with DJD  . HTN (hypertension)   . Hyperglycemia 05/13/2008   Qualifier: Diagnosis of  By: Glori Bickers MD, Carmell [place]   . Hypothyroidism   . Interstitial lung disease ([place])    ?  Marland Kitchen Left ovarian cyst    x 3, rupture  . Osteoarthritis    hands  . Osteopenia    mild-11/01; improved 12/05  . Other screening mammogram 08/18/2011  . Palpitations   . PERSONAL HISTORY ALLERGY UNSPEC MEDICINAL AGENT 03/18/2010   Qualifier: Diagnosis of  By: Glori Bickers MD, Carmell Austria   .  Raynaud's disease   . Recurrent HSV (herpes simplex virus)    lesions in nose  . Rhinitis 03/21/2012  . Routine general medical examination at a health care facility 03/28/2011   Past Surgical History:  Procedure Laterality Date  . COLONOSCOPY  11/01   Diverticulosis; hemorrhoids  . HAND SURGERY     left thumb  . LASIK     bilateral   Social History   Tobacco Use  . Smoking status: Former Smoker    Types: Cigarettes  . Smokeless tobacco: Never Used  . Tobacco comment: quit over 40 years  Vaping Use  . Vaping Use: Never used  Substance Use Topics  . Alcohol use: No    Alcohol/week: 0.0 standard drinks  . Drug use: No   Family History  Problem Relation Age of Onset  . Emphysema Father        + smoker  . Lung cancer Mother        + smoker  . Coronary artery disease Mother        relatively young  . Lymphoma Brother   . Lymphoma Sister   . Diabetes Brother   . Diabetes Sister   . Heart disease Brother   . Anemia Brother        aplactic   . Breast cancer Neg Hx    Allergies  Allergen Reactions   . Keflex [Cephalexin] Anaphylaxis  . Penicillins Anaphylaxis  . Amitriptyline Hcl     REACTION: sedating  . Atorvastatin     REACTION: muscle  . Benicar [Olmesartan Medoxomil]     Muscle pain   . Ceftin [Cefuroxime]     Swelling, "legs turn blue"  . Cetirizine Hcl     REACTION: headache  . Ciprofloxacin     REACTION: ? rash vs sun rxn  . Clonidine Derivatives     Swelling   . Diovan [Valsartan]     Thought it made her feel confused  . Erythromycin     Rash, swollen gums   . Etodolac     REACTION: reaction not known  . Fluoxetine Hcl     REACTION: stomach problems  . Furosemide     REACTION: swelling  . Gabapentin     REACTION: edema  . Naproxen Sodium     REACTION: edema  . Paroxetine     REACTION: weight gain  . Pregabalin     REACTION: swelling  . Sulfonamide Derivatives     Rash, swollen gums, lips  . Tetracycline     REACTION:inflammed genitals  . Venlafaxine     REACTION: sweating  . Cephalexin Hives and Rash   Current Outpatient Medications on File Prior to Visit  Medication Sig Dispense Refill  . buPROPion (WELLBUTRIN XL) 150 MG 24 hr tablet Take 1 tablet (150 mg total) by mouth daily. 90 tablet 3  . Chlorphen-Phenyleph-ASA (ALKA-SELTZER PLUS COLD PO) Take by mouth as needed.    . cyclobenzaprine (FLEXERIL) 10 MG tablet TAKE 1/2 TO 1 TABLET BY  MOUTH ONCE DAILY AND 1  TABLET AT BEDTIME 180 tablet 1  . Diclofenac Sodium 1.5 % SOLN Place 2 mLs onto the skin 4 (four) times daily. 3 Bottle 3  . diphenhydrAMINE (BENADRYL) 25 mg capsule Take 25 mg by mouth as needed.    . DULoxetine (CYMBALTA) 60 MG capsule Take 1 capsule (60 mg total) by mouth daily. 90 capsule 3  . esomeprazole (NEXIUM) 20 MG capsule Take 1 capsule (20 mg total) by mouth daily. [place]  capsule 3  . famotidine (PEPCID) 40 MG tablet Take 1 tablet (40 mg total) by mouth daily. 90 tablet 3  . ferrous sulfate 325 (65 FE) MG EC tablet Take 325 mg by mouth daily with breakfast.    . glucose blood test  strip One Touch Ultra stripts blue-To check sugar once daily and as needed for DM2 250.00 100 each 3  . GUAIFENESIN CR PO Take by mouth as needed.    Marland Kitchen HYDROmorphone HCl (EXALGO) 12 MG T24A SR tablet Take 12 mg by mouth daily.    . hyoscyamine (LEVSIN SL) 0.125 MG SL tablet Take 1 tablet (0.125 mg total) by mouth every 4 (four) hours as needed for cramping. 270 tablet 3  . ketoconazole (NIZORAL) 2 % shampoo SHAMPOO WITH A SMALL AMOUNT AS DIRECTED THREE TIMES A WEEK SHAMPOO SCALP 3 DAYS A WEEK, LET SHAMPOO SIT FOR 10 MINUTES AND RINSE OFF 120 mL 2  . levothyroxine (SYNTHROID) 150 MCG tablet Take 1 tablet (150 mcg total) by mouth daily. 90 tablet 3  . Magnesium 400 MG CAPS Take by mouth daily.    . metFORMIN (GLUCOPHAGE) 500 MG tablet TAKE 1 TABLET BY MOUTH  TWICE DAILY WITH A MEAL 180 tablet 3  . metoprolol succinate (TOPROL-XL) 50 MG 24 hr tablet TAKE 1 TABLET BY MOUTH  TWICE DAILY TAKE WITH OR  IMMEDIATELY FOLLOWING A  MEAL 180 tablet 3  . oxyCODONE (OXYCONTIN) 10 MG 12 hr tablet Take 10 mg by mouth 3 (three) times daily as needed.     . Probiotic Product (PROBIOTIC PO) Take 1 capsule by mouth daily.    . ramipril (ALTACE) 5 MG capsule Take 1 capsule (5 mg total) by mouth daily. 90 capsule 3  . rosuvastatin (CRESTOR) 5 MG tablet TAKE 1 TABLET BY MOUTH  TWICE WEEKLY 26 tablet 3  . torsemide (DEMADEX) 20 MG tablet Take 2 tablets (40 mg total) by mouth daily. 26 tablet 0   No current facility-administered medications on file prior to visit.     Review of Systems  Constitutional: Positive for fatigue. Negative for activity change, appetite change, fever and unexpected weight change.  HENT: Negative for congestion, ear pain, rhinorrhea, sinus pressure and sore throat.   Eyes: Negative for pain, redness and visual disturbance.  Respiratory: Negative for cough, shortness of breath and wheezing.   Cardiovascular: Negative for chest pain and palpitations.  Gastrointestinal: Negative for abdominal  pain, blood in stool, constipation and diarrhea.  Endocrine: Negative for polydipsia and polyuria.  Genitourinary: Negative for dysuria, frequency and urgency.  Musculoskeletal: Positive for arthralgias, back pain and joint swelling. Negative for myalgias.  Skin: Negative for pallor and rash.  Allergic/Immunologic: Negative for environmental allergies.  Neurological: Negative for dizziness, syncope, facial asymmetry and headaches.  Hematological: Negative for adenopathy. Does not bruise/bleed easily.  Psychiatric/Behavioral: Negative for decreased concentration and dysphoric mood. The patient is not nervous/anxious.        Objective:   Physical Exam Constitutional:      General: She is not in acute distress.    Appearance: Normal appearance. She is well-developed and well-nourished. She is obese. She is not ill-appearing or diaphoretic.  HENT:     Head: Normocephalic and atraumatic.     Mouth/Throat:     Mouth: Oropharynx is clear and moist.  Eyes:     General: No scleral icterus.       Right eye: No discharge.        Left eye: No discharge.  Extraocular Movements: EOM normal.     Conjunctiva/sclera: Conjunctivae normal.     Pupils: Pupils are equal, round, and reactive to light.     Comments: Recently had eye inflammation-? Diagnosis  Took a while to treat  Neck:     Thyroid: No thyromegaly.     Vascular: No carotid bruit or JVD.  Cardiovascular:     Rate and Rhythm: Normal rate and regular rhythm.     Pulses: Intact distal pulses.     Heart sounds: Normal heart sounds. No gallop.      Comments: Baseline red/purple color change in feet and fingers from Raynaud's  Pulmonary:     Effort: Pulmonary effort is normal. No respiratory distress.     Breath sounds: Normal breath sounds. No wheezing or rales.     Comments: No crackles Chest:     Chest wall: No tenderness.  Abdominal:     General: Bowel sounds are normal. There is no distension or abdominal bruit.      Palpations: Abdomen is soft. There is no mass.     Tenderness: There is no abdominal tenderness.  Musculoskeletal:     Cervical back: Normal range of motion and neck supple. No rigidity or tenderness.     Right lower leg: Edema present.     Left lower leg: Edema present.     Comments: Trace to one plus pedal edema   Lymphadenopathy:     Cervical: No cervical adenopathy.  Skin:    General: Skin is warm and dry.     Findings: No rash.  Neurological:     Mental Status: She is alert.     Cranial Nerves: No cranial nerve deficit.     Motor: No weakness.     Deep Tendon Reflexes: Reflexes are normal and symmetric. Reflexes normal.  Psychiatric:        Mood and Affect: Mood and affect and mood normal.           Assessment & Plan:   Problem List Items Addressed This Visit      Cardiovascular and Mediastinum   Raynaud's syndrome    Stable  Marked color change in feet      Relevant Orders   ANA   Rheumatoid factor   Sedimentation Rate (Completed)   C-reactive protein (Completed)   CBC with Differential/Platelet (Completed)     Other   Obesity    No doubt this worsens joint pain  Discussed how this problem influences overall health and the risks it imposes  Reviewed plan for weight loss with lower calorie diet (via better food choices and also portion control or program like weight watchers) and exercise building up to or more than 30 minutes 5 days per week including some aerobic activity         Pedal edema    This is fairly stable Reviewed last labs       Relevant Orders   Basic metabolic panel (Completed)   Joint pain - Primary    Pain in multiple joints (mostly hands) recently worsened  occ red/warn joint (not today) No deformity  Marked improvement with prednisone  Now symptoms improving Avoiding nsaids in light of inc GFR recently (now back to baseline)  Adv use of voltaren gel  Labs for autoimmune jt dz today  Feel she may have a seronegative auto  immune disease at this point        Relevant Orders   ANA   Rheumatoid factor   Sedimentation Rate (  Completed)   C-reactive protein (Completed)   CBC with Differential/Platelet (Completed)

## 2020-11-14 ENCOUNTER — Encounter: Payer: Self-pay | Admitting: Family Medicine

## 2020-11-14 ENCOUNTER — Ambulatory Visit: Payer: Medicare Other | Admitting: Family Medicine

## 2020-11-14 MED ORDER — PREDNISONE 10 MG PO TABS: ORAL_TABLET | ORAL | 0 refills | Status: DC

## 2020-11-14 NOTE — Telephone Encounter (Signed)
Px pended but don't know what pharmacy  Please ask her and send it  thanks

## 2020-11-16 LAB — ANA: Anti Nuclear Antibody (ANA): POSITIVE — AB

## 2020-11-16 LAB — ANTI-NUCLEAR AB-TITER (ANA TITER): ANA Titer 1: 1:320 {titer} — ABNORMAL HIGH

## 2020-11-16 LAB — RHEUMATOID FACTOR: Rheumatoid fact SerPl-aCnc: <14 IU/mL (ref <14)

## 2020-11-17 ENCOUNTER — Telehealth: Payer: Self-pay | Admitting: Family Medicine

## 2020-11-17 DIAGNOSIS — I7300 Raynaud's syndrome without gangrene: Secondary | ICD-10-CM

## 2020-11-17 DIAGNOSIS — M25542 Pain in joints of left hand: Secondary | ICD-10-CM

## 2020-11-17 DIAGNOSIS — R768 Other specified abnormal immunological findings in serum: Secondary | ICD-10-CM | POA: Insufficient documentation

## 2020-11-17 DIAGNOSIS — M25541 Pain in joints of right hand: Secondary | ICD-10-CM

## 2020-11-17 NOTE — Telephone Encounter (Signed)
-----   Message from Tammi Sou, [place] sent at 11/17/2020 10:36 AM EST ----- Pt viewed lab results via mychart. I also called and advise her of Dr. Marliss Coots recommendations. Pt has seen Dr. Estanislado Pandy in the past so if possible she would like to go back to her. I advise pt PCP will put referral in and our [facility] will call to schedule appt

## 2020-11-21 ENCOUNTER — Encounter: Payer: Self-pay | Admitting: Family Medicine

## 2020-12-01 ENCOUNTER — Encounter: Payer: Self-pay | Admitting: Family Medicine

## 2020-12-01 MED ORDER — PREDNISONE 10 MG PO TABS: ORAL_TABLET | ORAL | 0 refills | Status: DC

## 2020-12-10 NOTE — Assessment & Plan Note (Addendum)
Elevate legs above heart We can consider increasing dose of torsemide if need or consider careful use of low dose meloxicam if joint pain and swelling is not improving. Marland Kitchen

## 2020-12-10 NOTE — Assessment & Plan Note (Signed)
Prednisone taper to treat arthralgia.Marland Kitchen likely due to OA.

## 2020-12-22 NOTE — Progress Notes (Signed)
Office Visit Note  Patient: Tina Berger             Date of Birth: [birth year]           MRN: [id number]             PCP: Abner Greenspan, MD Referring: Tower, [name], MD Visit Date: 12/31/2020 Occupation: _0 @  Subjective:  Pain in multiple joints.   History of Present Illness: Tina Berger is a 70 y.o. female seen in consultation per request of her PCP.  According to patient she has had history of degenerative disc disease of cervical thoracic and lumbar spine for many years.  She has had C-spine fusion in the past.  She continues to have discomfort in her entire spine.  She also has osteoarthritis in her hands and had left [place] surgery x2.  She still is in constant pain.  She has been going to the pain clinic.  She states when she was in her 32s she was diagnosed with Raynaud's phenomenon.  She states when she wears close shoes her feet will get hot and inflamed.  She lives with Raynaud's symptoms all her life.  According to the patient she has seen 2 rheumatologists in the past 10 she saw me in 2013 at that time the work-up was negative.  She states in November 2021 she noted that she had fluid retention all the way from her feet to her about her knee.  She tried diuretics without much help.  She was also having left CMC discomfort.  She was getting tried different NSAIDs.  Then she was given prednisone taper and December 2021 for 2 weeks which helped her symptoms remarkably.  She states when she came off prednisone her symptoms recurred.  At the time labs were performed which came positive for ANA.  She was given another prednisone taper starting at 20 mg for 5 days and then left her on 10 mg p.o. daily.  She is a still on prednisone 10 mg p.o. daily.  She states her symptoms improved on prednisone again.  She states she had pain in her left CMC joint, right hand swelling, discomfort in her knees and her hips.  She also had discomfort in her left elbow.  She currently has discomfort in  her lower back and  coccyx region.  There is no family history of autoimmune disease.  She is gravida 2, para 2.  She gives history of oral ulcers, nasal ulcers, sicca symptoms, Raynaud's phenomenon, photosensitivity and joint pain.  Activities of Daily Living:  Patient reports morning stiffness for 2-3 hours.    Patient Reports nocturnal pain.  Difficulty dressing/grooming: Denies Difficulty climbing stairs: Denies Difficulty getting out of chair: Denies Difficulty using hands for taps, buttons, cutlery, and/or writing: Reports  Review of Systems  Constitutional: Positive for fatigue. Negative for night sweats, weight gain and weight loss.  HENT: Positive for mouth sores and mouth dryness. Negative for trouble swallowing, trouble swallowing and nose dryness.         nasal ulcers  Eyes: Positive for dryness. Negative for pain, redness, itching and visual disturbance.  Respiratory: Negative for cough, shortness of breath and difficulty breathing.   Cardiovascular: Positive for palpitations. Negative for chest pain, hypertension, irregular heartbeat and swelling in legs/feet.       She is on metoprolol.  Gastrointestinal: Negative for blood in stool, constipation and diarrhea.  Endocrine: Negative for increased urination.  Genitourinary: Negative for difficulty urinating and vaginal  dryness.  Musculoskeletal: Positive for arthralgias, joint pain, joint swelling, myalgias, morning stiffness, muscle tenderness and myalgias. Negative for muscle weakness.  Skin: Positive for color change, rash and sensitivity to sunlight. Negative for hair loss, redness, skin tightness and ulcers.       Frequent rash  Allergic/Immunologic: Negative for susceptible to infections.  Neurological: Positive for headaches. Negative for dizziness, numbness, memory loss, night sweats and weakness.       History of migraines.  Hematological: Negative for bruising/bleeding tendency and swollen glands.   Psychiatric/Behavioral: Positive for depressed mood. Negative for confusion and sleep disturbance. The patient is nervous/anxious.     PMFS History:  Patient Active Problem List   Diagnosis Date Noted  . Positive ANA (antinuclear antibody) 11/17/2020  . Joint pain 11/13/2020  . Elevated serum creatinine 08/26/2020  . Current use of proton pump inhibitor 08/26/2020  . Estrogen deficiency 08/21/2018  . TMJ (dislocation of temporomandibular joint), initial encounter 05/05/2018  . Anterior neck pain 05/05/2018  . Chronic pain syndrome 11/09/2017  . Chronic upper extremity pain [facility] Pain) (Bilateral) (L>R) 11/09/2017  . Fibromyalgia syndrome 11/09/2017  . Osteoarthritis 11/09/2017  . Osteoarthritis of lumbar facet joint (Bilateral) 11/09/2017  . Lumbar facet arthropathy (Bilateral) 11/09/2017  . Lumbar facet syndrome (Bilateral) (L>R) 11/09/2017  . Lumbar foraminal stenosis (multilevel) (Bilateral) 11/09/2017  . DDD (degenerative disc disease), lumbar 11/09/2017  . Thoracic levoscoliosis 11/09/2017  . Thoracic facet syndrome (Bilateral) (L>R) 11/09/2017  . Thoracic facet arthropathy (Bilateral) (R>L) 11/09/2017  . DDD (degenerative disc disease), thoracic 11/09/2017  . Thoracolumbar IVDD 11/09/2017  . DDD (degenerative disc disease), cervical 11/09/2017  . Osteoarthritis of cervical facet (Bilateral) 11/09/2017  . Grade 1 Anterolisthesis of C7 over T1 11/09/2017  . Cervical foraminal stenosis (Bilateral) 11/09/2017  . History of fusion of cervical spine (C5-6 ACDF) 11/09/2017  . Cervical facet syndrome (Bilateral) 11/09/2017  . Disorder of skeletal system 11/09/2017  . Cervical radiculitis (Bilateral) 11/09/2017  . Lumbar Epidural lipomatosis 11/09/2017  . Chronic sacroiliac joint pain (Bilateral) (L>R) 11/09/2017  . Chronic hip pain (Bilateral) (L>R) 11/09/2017  . Chronic upper back pain (Primary Area of Pain) (midline) 10/24/2017  . Chronic neck pain (Secondary Area  of Pain) (Bilateral)  (L>R) 10/24/2017  . Chronic low back pain (Fourth Area of Pain) (Bilateral) (L>R) 10/24/2017  . Chronic lower extremity pain (Fifth Area of Pain) (Bilateral) (L>R) 10/24/2017  . Lumbar Grade 1 Retrolisthesis of L1-2 and L2-3 10/24/2017  . Other long term (current) drug therapy 10/24/2017  . Other specified health status 10/24/2017  . Long term current use of opiate analgesic 10/24/2017  . Long term prescription opiate use 10/24/2017  . Opiate use 10/24/2017  . DM type 2 (diabetes mellitus, type 2) ([place]) 06/14/2014  . Pharmacologic therapy 06/14/2014  . Pedal edema   . Post-menopausal 06/23/2012  . Lumbar disc disease with radiculopathy 11/18/2011  . HTN (hypertension) 07/30/2011  . Raynaud disease 07/30/2011  . Obesity 07/30/2011  . Routine general medical examination at a health care facility 03/28/2011  . Palpitations 04/27/2010  . Problems influencing health status 12/11/2009  . Depression with anxiety 06/14/2008  . Vitamin D deficiency 05/13/2008  . ANXIETY 03/25/2008  . Osteopenia 03/25/2008  . Hypothyroidism 03/28/2007  . Hyperlipidemia associated with type 2 diabetes mellitus ([place]) 03/28/2007  . Other iron deficiency anemias 03/28/2007  . PANIC ATTACK 03/28/2007  . KERATOCONJUNCTIVITIS SICCA 03/28/2007  . Mitral valve disorder 03/28/2007  . ABNORMAL HEART RHYTHMS 03/28/2007  . Raynaud's syndrome 03/28/2007  .  GERD 03/28/2007  . IBS 03/28/2007  . Rosacea 03/28/2007  . PLANTAR FASCIITIS 03/28/2007  . MIGRAINES, HX OF 03/28/2007    Past Medical History:  Diagnosis Date  . Acute renal insufficiency 05/31/2014  . Anemia, iron deficiency   . Bilateral lower extremity edema    a. uses torsemide  . Cervical dysplasia    abnormal paps  . Colon cancer screening 06/14/2014  . Cough 08/08/2012  . Degenerative disk disease 11/19/2011  . Diabetes mellitus type II    Diet controlled  . Diverticulosis   . DJD (degenerative joint disease)   . Drug rash  05/22/2011  . Dry eyes   . Edema   . Elevated liver enzymes 03/21/2012  . Encounter for routine gynecological examination 06/14/2014  . ESOPHAGITIS 03/28/2007   Qualifier: Hospitalized for  By: Marcelino Scot CMA, [place]    . Fibromyalgia   . GASTRITIS 03/28/2007   Qualifier: History of  By: Marcelino Scot CMA, [place] Bilberry    . GERD (gastroesophageal reflux disease)   . Hemorrhoids    external  . HLD (hyperlipidemia)   . HNP (herniated nucleus pulposus) 2/99   T6,7,8 with DJD  . HTN (hypertension)   . Hyperglycemia 05/13/2008   Qualifier: Diagnosis of  By: Glori Bickers MD, Carmell [place]   . Hypothyroidism   . Interstitial lung disease ([place])    ?  Marland Kitchen Left ovarian cyst    x 3, rupture  . Osteoarthritis    hands  . Osteopenia    mild-11/01; improved 12/05  . Other screening mammogram 08/18/2011  . Palpitations   . PERSONAL HISTORY ALLERGY UNSPEC MEDICINAL AGENT 03/18/2010   Qualifier: Diagnosis of  By: Glori Bickers MD, Carmell [place]   . Raynaud's disease   . Recurrent HSV (herpes simplex virus)    lesions in nose  . Rhinitis 03/21/2012  . Routine general medical examination at a health care facility 03/28/2011    Family History  Problem Relation Age of Onset  . Emphysema Father        + smoker  . Lung cancer Mother        + smoker  . Coronary artery disease Mother        relatively young  . Lymphoma Sister   . Heart disease Brother   . Lymphoma Brother   . Parkinson's disease Brother   . Anemia Brother        aplastic   . Diabetes Brother   . Leukemia Son   . Iron deficiency Daughter   . Breast cancer Neg Hx    Past Surgical History:  Procedure Laterality Date  . ABDOMINAL EXPLORATION SURGERY    . COLONOSCOPY  11/01   Diverticulosis; hemorrhoids  . HAND SURGERY     left thumb  . LASIK     bilateral   Social History   Social History Narrative   Married      1 child      Network engineer to Scientist, physiological at [employer]      Does regularly exercise      Daily caffeine use: 2/day         Immunization  History  Administered Date(s) Administered  . Fluad Quad(high Dose 65+) 08/07/2020  . Influenza Split 06/19/2011  . Influenza Whole 08/01/2009  . Influenza, High Dose Seasonal PF 08/14/2018, 07/03/2019  . Influenza,inj,Quad PF,6+ Mos 06/14/2014, 08/25/2015, 07/26/2016, 08/19/2017  . Moderna Sars-Covid-2 Vaccination 09/17/2020  . PFIZER(Purple Top)SARS-COV-2 Vaccination 11/27/2019, 12/18/2019  . Pneumococcal Conjugate-13 08/25/2015  . Pneumococcal Polysaccharide-23 08/25/2016  .  Td 11/25/2003  . Tdap 06/14/2014  . Zoster 09/16/2014     Objective: Vital Signs: BP 131/83 (BP Location: Right Arm, Patient Position: Sitting, Cuff Size: Normal)   Pulse 73   Resp 14   Ht 5' 1.75" (1.568 m)   Wt 196 lb (88.9 kg)   BMI 36.14 kg/m    Physical Exam Vitals and nursing note reviewed.  Constitutional:      Appearance: She is well-developed.  HENT:     Head: Normocephalic and atraumatic.  Eyes:     Conjunctiva/sclera: Conjunctivae normal.  Cardiovascular:     Rate and Rhythm: Normal rate and regular rhythm.     Heart sounds: Normal heart sounds.  Pulmonary:     Effort: Pulmonary effort is normal.     Breath sounds: Normal breath sounds.  Abdominal:     General: Bowel sounds are normal.     Palpations: Abdomen is soft.  Musculoskeletal:     Cervical back: Normal range of motion.  Lymphadenopathy:     Cervical: No cervical adenopathy.  Skin:    General: Skin is warm and dry.     Capillary Refill: Capillary refill takes less than 2 seconds.     Comments: No sclerodactyly or nailbed capillary changes were noted.  She has decreased capillary refill in her lower extremities.  There was no evidence of malar rash.  Some hyperemia was noted on her neck.  Neurological:     Mental Status: She is alert and oriented to person, place, and time.  Psychiatric:        Behavior: Behavior normal.      Musculoskeletal Exam: C-spine thoracic and lumbar spine were in limited range of motion with  discomfort.  Shoulder joints, elbow joints, wrist joints, MCPs PIPs and DIPs with good range of motion.  She had left CMC postsurgical changes.  Hip joints and knee joints with good range of motion without any warmth swelling effusion.  She has no tenderness over ankles.  She had some osteoarthritic changes in her feet without synovitis.  She had generalized hyperalgesia and positive tender points.  CDAI Exam: CDAI Score: - Patient Global: -; Provider Global: - Swollen: -; Tender: - Joint Exam 12/31/2020   No joint exam has been documented for this visit   There is currently no information documented on the homunculus. Go to the Rheumatology activity and complete the homunculus joint exam.  Investigation: No additional findings.  Imaging: XR Hand 2 View Left  Result Date: 12/31/2020 Severe CMC narrowing and subluxation was noted.  PIP and DIP narrowing was noted.  No MCP, intercarpal or radiocarpal joint space narrowing was noted.  No erosive changes were noted. Impression: These findings are consistent with osteoarthritis of the hand.  Severe CMC narrowing and subluxation was noted.  XR Hand 2 View Right  Result Date: 12/31/2020 Mild PIP and DIP narrowing was noted.  No MCP, intercarpal or radiocarpal joint space narrowing was noted.  No erosive changes were noted. Impression: These findings are consistent with early osteoarthritis of the hand.  XR KNEE 3 VIEW LEFT  Result Date: 12/31/2020 Mild medial compartment narrowing was noted.  Mild patellofemoral narrowing was noted.  No chondrocalcinosis was noted. Impression: These findings are consistent with mild osteoarthritis and mild chondromalacia patella.  XR KNEE 3 VIEW RIGHT  Result Date: 12/31/2020 Mild medial compartment narrowing was noted.  Mild patellofemoral narrowing was noted.  No chondrocalcinosis was noted. Impression: These findings are consistent with mild osteoarthritis and mild chondromalacia patella.  Recent  Labs: Lab Results  Component Value Date   WBC 5.4 11/13/2020   HGB 12.4 11/13/2020   PLT 286.0 11/13/2020   NA 138 11/13/2020   K 4.9 11/13/2020   CL 101 11/13/2020   CO2 33 (H) 11/13/2020   GLUCOSE 101 (H) 11/13/2020   BUN 26 (H) 11/13/2020   CREATININE 1.15 11/13/2020   BILITOT 0.4 08/20/2020   ALKPHOS 68 08/20/2020   AST 21 08/20/2020   ALT 19 08/20/2020   PROT 7.7 08/20/2020   ALBUMIN 4.7 08/20/2020   CALCIUM 9.0 11/13/2020   GFRAA >60 04/14/2020   July 24, 2012 ANCA negative, sed rate 11, lupus anticoagulant negative, beta-2 GP 1 -, anticardiolipin negative, ANA negative, ENA negative, RF negative, anti-CCP negative, ACE 1, uric acid 4.1, p-ANCA 1: 20, cryoglobulins negative, CBC normal, ESR 11, CMP normal, hepatitis panel negative, immunoglobulins normal, SPEP normal, UA negative, vitamin D 18  Speciality Comments: No specialty comments available.  Procedures:  No procedures performed Allergies: Keflex [cephalexin], Penicillins, Amitriptyline hcl, Atorvastatin, Benicar [olmesartan medoxomil], Ceftin [cefuroxime], Cetirizine hcl, Ciprofloxacin, Clonidine derivatives, Diovan [valsartan], Erythromycin, Etodolac, Fluoxetine hcl, Furosemide, Gabapentin, Naproxen sodium, Paroxetine, Pregabalin, Sulfonamide derivatives, Tetracycline, Venlafaxine, and Cephalexin   Assessment / Plan:     Visit Diagnoses: Positive ANA (antinuclear antibody) - 11/13/20: ANA 1:320NH, RF<14, ESR 72, CRP 2.9.  Patient gives history of oral ulcers, nasal ulcers, rashes, photosensitivity, joint pain, Raynaud's phenomenon.  She had extensive work-up in 2013 by me which was negative.  I reviewed records from 2013.  She is also seen 2 other rheumatologist in the past and no diagnosis was established.  I will obtain AVISE labs.  Raynaud's disease without gangrene-she gives history of Raynaud's since she was 70 years old.  She had purplish discoloration in her feet on my examination today.  No nailbed capillary  changes or sclerodactyly was noted.  She is on metoprolol which can make her Raynaud's symptoms worse.  Arthralgia of both hands -she complains of discomfort in her hands.  No synovitis was noted.  She is currently on prednisone.  Have advised her to taper off prednisone completely so we can see if there is any inflammation at the follow-up visit.  Plan: XR Hand 2 View Right, XR Hand 2 View Left.  X-rays were consistent with osteoarthritis.  Severe left CMC narrowing and subluxation was noted.  Chronic pain of both knees -she complains of discomfort and swelling in her knee joints.  No synovitis was noted.  I will obtain x-rays today.  Plan: XR KNEE 3 VIEW RIGHT, XR KNEE 3 VIEW LEFT.  X-rays were consistent mild osteoarthritis and mild chondromalacia patella.  Fibromyalgia syndrome-she continues to have generalized pain and discomfort.  She has generalized hyperalgesia.  Chronic pain syndrome-she is followed at pain management.  Long term current use of opiate analgesic  DDD (degenerative disc disease), cervical-she has limited range of motion chronic pain.  History of fusion of cervical spine (C5-6 ACDF)  DDD (degenerative disc disease), thoracic-she has thoracic kyphosis and discomfort.  Lumbar disc disease with radiculopathy-she continues to have lower back pain.  Lumbar Epidural lipomatosis  Primary hypertension-her blood pressure is normal today.  I noticed that she is on metoprolol.  Metoprolol can make Raynauds symptoms worse.  Mitral valve disorder  History of gastroesophageal reflux (GERD)  History of IBS  Type 2 diabetes mellitus without complication, without long-term current use of insulin (HCC)  Hyperlipidemia associated with type 2 diabetes mellitus (HCC)-she is on rosuvastatin.  History  of hypothyroidism  Depression with anxiety-she is on Wellbutrin and Cymbalta.  Orders: Orders Placed This Encounter  Procedures  . XR KNEE 3 VIEW RIGHT  . XR KNEE 3 VIEW LEFT   . XR Hand 2 View Right  . XR Hand 2 View Left   No orders of the defined types were placed in this encounter.    Follow-Up Instructions: Return for Osteoarthritis, raynauds, +ANA.   Bo Merino, MD  Note - This record has been created using Editor, commissioning.  Chart creation errors have been sought, but may not always  have been located. Such creation errors do not reflect on  the standard of medical care.

## 2020-12-31 ENCOUNTER — Ambulatory Visit: Payer: Medicare Other | Admitting: Rheumatology

## 2020-12-31 ENCOUNTER — Ambulatory Visit: Payer: Self-pay

## 2020-12-31 ENCOUNTER — Other Ambulatory Visit: Payer: Self-pay

## 2020-12-31 ENCOUNTER — Encounter: Payer: Self-pay | Admitting: Rheumatology

## 2020-12-31 VITALS — BP 131/83 | HR 73 | Resp 14 | Ht 61.75 in | Wt 196.0 lb

## 2020-12-31 DIAGNOSIS — M5030 Other cervical disc degeneration, unspecified cervical region: Secondary | ICD-10-CM

## 2020-12-31 DIAGNOSIS — M5134 Other intervertebral disc degeneration, thoracic region: Secondary | ICD-10-CM | POA: Diagnosis not present

## 2020-12-31 DIAGNOSIS — M25541 Pain in joints of right hand: Secondary | ICD-10-CM

## 2020-12-31 DIAGNOSIS — R768 Other specified abnormal immunological findings in serum: Secondary | ICD-10-CM | POA: Diagnosis not present

## 2020-12-31 DIAGNOSIS — R7689 Other specified abnormal immunological findings in serum: Secondary | ICD-10-CM

## 2020-12-31 DIAGNOSIS — I059 Rheumatic mitral valve disease, unspecified: Secondary | ICD-10-CM

## 2020-12-31 DIAGNOSIS — Z981 Arthrodesis status: Secondary | ICD-10-CM

## 2020-12-31 DIAGNOSIS — M25542 Pain in joints of left hand: Secondary | ICD-10-CM

## 2020-12-31 DIAGNOSIS — E785 Hyperlipidemia, unspecified: Secondary | ICD-10-CM

## 2020-12-31 DIAGNOSIS — M25562 Pain in left knee: Secondary | ICD-10-CM

## 2020-12-31 DIAGNOSIS — Z79891 Long term (current) use of opiate analgesic: Secondary | ICD-10-CM | POA: Diagnosis not present

## 2020-12-31 DIAGNOSIS — M5116 Intervertebral disc disorders with radiculopathy, lumbar region: Secondary | ICD-10-CM

## 2020-12-31 DIAGNOSIS — F418 Other specified anxiety disorders: Secondary | ICD-10-CM

## 2020-12-31 DIAGNOSIS — G894 Chronic pain syndrome: Secondary | ICD-10-CM | POA: Diagnosis not present

## 2020-12-31 DIAGNOSIS — E1169 Type 2 diabetes mellitus with other specified complication: Secondary | ICD-10-CM

## 2020-12-31 DIAGNOSIS — Z8639 Personal history of other endocrine, nutritional and metabolic disease: Secondary | ICD-10-CM

## 2020-12-31 DIAGNOSIS — E882 Lipomatosis, not elsewhere classified: Secondary | ICD-10-CM

## 2020-12-31 DIAGNOSIS — Z8719 Personal history of other diseases of the digestive system: Secondary | ICD-10-CM

## 2020-12-31 DIAGNOSIS — G8929 Other chronic pain: Secondary | ICD-10-CM | POA: Diagnosis not present

## 2020-12-31 DIAGNOSIS — M25561 Pain in right knee: Secondary | ICD-10-CM

## 2020-12-31 DIAGNOSIS — I7300 Raynaud's syndrome without gangrene: Secondary | ICD-10-CM

## 2020-12-31 DIAGNOSIS — D1779 Benign lipomatous neoplasm of other sites: Secondary | ICD-10-CM | POA: Diagnosis not present

## 2020-12-31 DIAGNOSIS — M4802 Spinal stenosis, cervical region: Secondary | ICD-10-CM

## 2020-12-31 DIAGNOSIS — M797 Fibromyalgia: Secondary | ICD-10-CM

## 2020-12-31 DIAGNOSIS — M5412 Radiculopathy, cervical region: Secondary | ICD-10-CM

## 2020-12-31 DIAGNOSIS — I10 Essential (primary) hypertension: Secondary | ICD-10-CM

## 2020-12-31 DIAGNOSIS — E119 Type 2 diabetes mellitus without complications: Secondary | ICD-10-CM

## 2021-01-05 DIAGNOSIS — R768 Other specified abnormal immunological findings in serum: Secondary | ICD-10-CM | POA: Diagnosis not present

## 2021-01-05 DIAGNOSIS — I7300 Raynaud's syndrome without gangrene: Secondary | ICD-10-CM | POA: Diagnosis not present

## 2021-01-05 DIAGNOSIS — D8989 Other specified disorders involving the immune mechanism, not elsewhere classified: Secondary | ICD-10-CM | POA: Diagnosis not present

## 2021-01-08 ENCOUNTER — Telehealth: Payer: Self-pay

## 2021-01-08 DIAGNOSIS — M5134 Other intervertebral disc degeneration, thoracic region: Secondary | ICD-10-CM | POA: Diagnosis not present

## 2021-01-08 DIAGNOSIS — Z79891 Long term (current) use of opiate analgesic: Secondary | ICD-10-CM | POA: Diagnosis not present

## 2021-01-08 DIAGNOSIS — G894 Chronic pain syndrome: Secondary | ICD-10-CM | POA: Diagnosis not present

## 2021-01-08 DIAGNOSIS — M4725 Other spondylosis with radiculopathy, thoracolumbar region: Secondary | ICD-10-CM | POA: Diagnosis not present

## 2021-01-08 DIAGNOSIS — M5137 Other intervertebral disc degeneration, lumbosacral region: Secondary | ICD-10-CM | POA: Diagnosis not present

## 2021-01-08 DIAGNOSIS — K5903 Drug induced constipation: Secondary | ICD-10-CM | POA: Diagnosis not present

## 2021-01-08 DIAGNOSIS — Z79899 Other long term (current) drug therapy: Secondary | ICD-10-CM | POA: Diagnosis not present

## 2021-01-08 DIAGNOSIS — M961 Postlaminectomy syndrome, not elsewhere classified: Secondary | ICD-10-CM | POA: Diagnosis not present

## 2021-01-08 NOTE — Telephone Encounter (Signed)
Please advise 

## 2021-01-08 NOTE — Telephone Encounter (Signed)
Patient left a voicemail stating at the time of her appointment two weeks ago she was still taking Prednisone.  Patient states she now has severe pain in her hands and swelling in her feet and legs.  Patient requested a return call to let her know if there is something Dr. Estanislado Pandy can prescribe until she comes in for her follow-up on 01/21/21.

## 2021-01-08 NOTE — Telephone Encounter (Signed)
I returned patient's call and discussed that we do not want her to take any steroids.  I also advised not to take any NSAIDs.  She may take Tylenol for pain management.  I would like to examine when she is off prednisone.  Patient was in agreement.

## 2021-01-10 NOTE — Progress Notes (Deleted)
Office Visit Note  Patient: Tina Berger             Date of Birth: 1951/02/26           MRN: [id number]             PCP: Abner Greenspan, MD Referring: Tower, [name], MD Visit Date: 01/21/2021 Occupation: _0 @  Subjective:  No chief complaint on file.   History of Present Illness: Tina Berger is a 70 y.o. female ***   Activities of Daily Living:  Patient reports morning stiffness for *** {minute/hour:19697}.   Patient {ACTIONS;DENIES/REPORTS:21021675::"Denies"} nocturnal pain.  Difficulty dressing/grooming: {ACTIONS;DENIES/REPORTS:21021675::"Denies"} Difficulty climbing stairs: {ACTIONS;DENIES/REPORTS:21021675::"Denies"} Difficulty getting out of chair: {ACTIONS;DENIES/REPORTS:21021675::"Denies"} Difficulty using hands for taps, buttons, cutlery, and/or writing: {ACTIONS;DENIES/REPORTS:21021675::"Denies"}  No Rheumatology ROS completed.   PMFS History:  Patient Active Problem List   Diagnosis Date Noted  . Positive ANA (antinuclear antibody) 11/17/2020  . Joint pain 11/13/2020  . Elevated serum creatinine 08/26/2020  . Current use of proton pump inhibitor 08/26/2020  . Estrogen deficiency 08/21/2018  . TMJ (dislocation of temporomandibular joint), initial encounter 05/05/2018  . Anterior neck pain 05/05/2018  . Chronic pain syndrome 11/09/2017  . Chronic upper extremity pain [facility] Area of Pain) (Bilateral) (L>R) 11/09/2017  . Fibromyalgia syndrome 11/09/2017  . Osteoarthritis 11/09/2017  . Osteoarthritis of lumbar facet joint (Bilateral) 11/09/2017  . Lumbar facet arthropathy (Bilateral) 11/09/2017  . Lumbar facet syndrome (Bilateral) (L>R) 11/09/2017  . Lumbar foraminal stenosis (multilevel) (Bilateral) 11/09/2017  . DDD (degenerative disc disease), lumbar 11/09/2017  . Thoracic levoscoliosis 11/09/2017  . Thoracic facet syndrome (Bilateral) (L>R) 11/09/2017  . Thoracic facet arthropathy (Bilateral) (R>L) 11/09/2017  . DDD (degenerative disc disease),  thoracic 11/09/2017  . Thoracolumbar IVDD 11/09/2017  . DDD (degenerative disc disease), cervical 11/09/2017  . Osteoarthritis of cervical facet (Bilateral) 11/09/2017  . Grade 1 Anterolisthesis of C7 over T1 11/09/2017  . Cervical foraminal stenosis (Bilateral) 11/09/2017  . History of fusion of cervical spine (C5-6 ACDF) 11/09/2017  . Cervical facet syndrome (Bilateral) 11/09/2017  . Disorder of skeletal system 11/09/2017  . Cervical radiculitis (Bilateral) 11/09/2017  . Lumbar Epidural lipomatosis 11/09/2017  . Chronic sacroiliac joint pain (Bilateral) (L>R) 11/09/2017  . Chronic hip pain (Bilateral) (L>R) 11/09/2017  . Chronic upper back pain (Primary Area of Pain) (midline) 10/24/2017  . Chronic neck pain (Secondary Area of Pain) (Bilateral)  (L>R) 10/24/2017  . Chronic low back pain (Fourth Area of Pain) (Bilateral) (L>R) 10/24/2017  . Chronic lower extremity pain (Fifth Area of Pain) (Bilateral) (L>R) 10/24/2017  . Lumbar Grade 1 Retrolisthesis of L1-2 and L2-3 10/24/2017  . Other long term (current) drug therapy 10/24/2017  . Other specified health status 10/24/2017  . Long term current use of opiate analgesic 10/24/2017  . Long term prescription opiate use 10/24/2017  . Opiate use 10/24/2017  . DM type 2 (diabetes mellitus, type 2) ([place]) 06/14/2014  . Pharmacologic therapy 06/14/2014  . Pedal edema   . Post-menopausal 06/23/2012  . Lumbar disc disease with radiculopathy 11/18/2011  . HTN (hypertension) 07/30/2011  . Raynaud disease 07/30/2011  . Obesity 07/30/2011  . Routine general medical examination at a health care facility 03/28/2011  . Palpitations 04/27/2010  . Problems influencing health status 12/11/2009  . Depression with anxiety 06/14/2008  . Vitamin D deficiency 05/13/2008  . ANXIETY 03/25/2008  . Osteopenia 03/25/2008  . Hypothyroidism 03/28/2007  . Hyperlipidemia associated with type 2 diabetes mellitus ([place]) 03/28/2007  . Other iron deficiency anemias  03/28/2007  . PANIC ATTACK 03/28/2007  . KERATOCONJUNCTIVITIS SICCA 03/28/2007  . Mitral valve disorder 03/28/2007  . ABNORMAL HEART RHYTHMS 03/28/2007  . Raynaud's syndrome 03/28/2007  . GERD 03/28/2007  . IBS 03/28/2007  . Rosacea 03/28/2007  . PLANTAR FASCIITIS 03/28/2007  . MIGRAINES, HX OF 03/28/2007    Past Medical History:  Diagnosis Date  . Acute renal insufficiency 05/31/2014  . Anemia, iron deficiency   . Bilateral lower extremity edema    a. uses torsemide  . Cervical dysplasia    abnormal paps  . Colon cancer screening 06/14/2014  . Cough 08/08/2012  . Degenerative disk disease 11/19/2011  . Diabetes mellitus type II    Diet controlled  . Diverticulosis   . DJD (degenerative joint disease)   . Drug rash 05/22/2011  . Dry eyes   . Edema   . Elevated liver enzymes 03/21/2012  . Encounter for routine gynecological examination 06/14/2014  . ESOPHAGITIS 03/28/2007   Qualifier: Hospitalized for  By: Marcelino Scot CMA, [place]    . Fibromyalgia   . GASTRITIS 03/28/2007   Qualifier: History of  By: Marcelino Scot CMA, [place] Bilberry    . GERD (gastroesophageal reflux disease)   . Hemorrhoids    external  . HLD (hyperlipidemia)   . HNP (herniated nucleus pulposus) 2/99   T6,7,8 with DJD  . HTN (hypertension)   . Hyperglycemia 05/13/2008   Qualifier: Diagnosis of  By: Glori Bickers MD, Carmell Austria   . Hypothyroidism   . Interstitial lung disease ([place])    ?  Marland Kitchen Left ovarian cyst    x 3, rupture  . Osteoarthritis    hands  . Osteopenia    mild-11/01; improved 12/05  . Other screening mammogram 08/18/2011  . Palpitations   . PERSONAL HISTORY ALLERGY UNSPEC MEDICINAL AGENT 03/18/2010   Qualifier: Diagnosis of  By: Glori Bickers MD, Carmell Austria   . Raynaud's disease   . Recurrent HSV (herpes simplex virus)    lesions in nose  . Rhinitis 03/21/2012  . Routine general medical examination at a health care facility 03/28/2011    Family History  Problem Relation Age of Onset  . Emphysema Father        +  smoker  . Lung cancer Mother        + smoker  . Coronary artery disease Mother        relatively young  . Lymphoma Sister   . Heart disease Brother   . Lymphoma Brother   . Parkinson's disease Brother   . Anemia Brother        aplastic   . Diabetes Brother   . Leukemia Son   . Iron deficiency Daughter   . Breast cancer Neg Hx    Past Surgical History:  Procedure Laterality Date  . ABDOMINAL EXPLORATION SURGERY    . COLONOSCOPY  11/01   Diverticulosis; hemorrhoids  . HAND SURGERY     left thumb  . LASIK     bilateral   Social History   Social History Narrative   Married      1 child      Network engineer to Scientist, physiological at [employer]      Does regularly exercise      Daily caffeine use: 2/day         Immunization History  Administered Date(s) Administered  . Fluad Quad(high Dose 65+) 08/07/2020  . Influenza Split 06/19/2011  . Influenza Whole 08/01/2009  . Influenza, High Dose Seasonal PF 08/14/2018, 07/03/2019  . Influenza,inj,Quad PF,6+  Mos 06/14/2014, 08/25/2015, 07/26/2016, 08/19/2017  . Moderna Sars-Covid-2 Vaccination 09/17/2020  . PFIZER(Purple Top)SARS-COV-2 Vaccination 11/27/2019, 12/18/2019  . Pneumococcal Conjugate-13 08/25/2015  . Pneumococcal Polysaccharide-23 08/25/2016  . Td 11/25/2003  . Tdap 06/14/2014  . Zoster 09/16/2014     Objective: Vital Signs: There were no vitals taken for this visit.   Physical Exam   Musculoskeletal Exam: ***  CDAI Exam: CDAI Score: - Patient Global: -; Provider Global: - Swollen: -; Tender: - Joint Exam 01/21/2021   No joint exam has been documented for this visit   There is currently no information documented on the homunculus. Go to the Rheumatology activity and complete the homunculus joint exam.  Investigation: No additional findings.  Imaging: XR Hand 2 View Left  Result Date: 12/31/2020 Severe CMC narrowing and subluxation was noted.  PIP and DIP narrowing was noted.  No MCP, intercarpal or radiocarpal joint  space narrowing was noted.  No erosive changes were noted. Impression: These findings are consistent with osteoarthritis of the hand.  Severe CMC narrowing and subluxation was noted.  XR Hand 2 View Right  Result Date: 12/31/2020 Mild PIP and DIP narrowing was noted.  No MCP, intercarpal or radiocarpal joint space narrowing was noted.  No erosive changes were noted. Impression: These findings are consistent with early osteoarthritis of the hand.  XR KNEE 3 VIEW LEFT  Result Date: 12/31/2020 Mild medial compartment narrowing was noted.  Mild patellofemoral narrowing was noted.  No chondrocalcinosis was noted. Impression: These findings are consistent with mild osteoarthritis and mild chondromalacia patella.  XR KNEE 3 VIEW RIGHT  Result Date: 12/31/2020 Mild medial compartment narrowing was noted.  Mild patellofemoral narrowing was noted.  No chondrocalcinosis was noted. Impression: These findings are consistent with mild osteoarthritis and mild chondromalacia patella.    Recent Labs: Lab Results  Component Value Date   WBC 5.4 11/13/2020   HGB 12.4 11/13/2020   PLT 286.0 11/13/2020   NA 138 11/13/2020   K 4.9 11/13/2020   CL 101 11/13/2020   CO2 33 (H) 11/13/2020   GLUCOSE 101 (H) 11/13/2020   BUN 26 (H) 11/13/2020   CREATININE 1.15 11/13/2020   BILITOT 0.4 08/20/2020   ALKPHOS 68 08/20/2020   AST 21 08/20/2020   ALT 19 08/20/2020   PROT 7.7 08/20/2020   ALBUMIN 4.7 08/20/2020   CALCIUM 9.0 11/13/2020   GFRAA >60 04/14/2020   November 13, 2020 ANA 1: 320NH, RF negative, ESR 72, CRP 2.9   Speciality Comments: No specialty comments available.  Procedures:  No procedures performed Allergies: Keflex [cephalexin], Penicillins, Amitriptyline hcl, Atorvastatin, Benicar [olmesartan medoxomil], Ceftin [cefuroxime], Cetirizine hcl, Ciprofloxacin, Clonidine derivatives, Diovan [valsartan], Erythromycin, Etodolac, Fluoxetine hcl, Furosemide, Gabapentin, Naproxen sodium, Paroxetine,  Pregabalin, Sulfonamide derivatives, Tetracycline, Venlafaxine, and Cephalexin   Assessment / Plan:     Visit Diagnoses: No diagnosis found.  Orders: No orders of the defined types were placed in this encounter.  No orders of the defined types were placed in this encounter.   Face-to-face time spent with patient was *** minutes. Greater than 50% of time was spent in counseling and coordination of care.  Follow-Up Instructions: No follow-ups on file.   Bo Merino, MD  Note - This record has been created using Editor, commissioning.  Chart creation errors have been sought, but may not always  have been located. Such creation errors do not reflect on  the standard of medical care.

## 2021-01-12 ENCOUNTER — Encounter: Payer: Self-pay | Admitting: Rheumatology

## 2021-01-12 DIAGNOSIS — M170 Bilateral primary osteoarthritis of knee: Secondary | ICD-10-CM | POA: Insufficient documentation

## 2021-01-12 NOTE — Progress Notes (Unsigned)
Office Visit Note  Patient: Tina Berger             Date of Birth: 1951-09-20           MRN: [id number]             PCP: Abner Greenspan, MD Referring: Tower, [name], MD Visit Date: 01/13/2021 Occupation: @GUAROCC @  Subjective:  Pain and swelling in multiple joints.   History of Present Illness: Tina Berger is a 70 y.o. female with history of inflammatory arthritis.  She returns today for follow-up visit.  She has been off prednisone.  She has been having increased pain and swelling in multiple joints today.  She states she is having difficulty making a fist with her right hand.  She has swelling in her left wrist.  She complains of pain and discomfort in her right knee joint and her ankle joints.  She has been experiencing fatigue.  She also reports some shortness of breath on exertion.  Activities of Daily Living:  Patient reports morning stiffness for 24 hours.   Patient Denies nocturnal pain.  Difficulty dressing/grooming: Reports Difficulty climbing stairs: Denies Difficulty getting out of chair: Denies Difficulty using hands for taps, buttons, cutlery, and/or writing: Reports  Review of Systems  Constitutional: Positive for fatigue. Negative for night sweats, weight gain and weight loss.  HENT: Positive for mouth dryness. Negative for mouth sores, trouble swallowing, trouble swallowing and nose dryness.   Eyes: Positive for dryness. Negative for pain, redness and visual disturbance.  Respiratory: Positive for shortness of breath. Negative for cough and difficulty breathing.   Cardiovascular: Positive for swelling in legs/feet. Negative for chest pain, palpitations, hypertension and irregular heartbeat.  Gastrointestinal: Negative for blood in stool, constipation and diarrhea.  Endocrine: Positive for excessive thirst. Negative for increased urination.  Genitourinary: Negative for difficulty urinating and vaginal dryness.  Musculoskeletal: Positive for arthralgias, joint  pain, joint swelling, muscle weakness, morning stiffness and muscle tenderness. Negative for myalgias and myalgias.  Skin: Negative for color change, rash, hair loss, skin tightness, ulcers and sensitivity to sunlight.  Allergic/Immunologic: Negative for susceptible to infections.  Neurological: Positive for weakness. Negative for dizziness, memory loss and night sweats.  Hematological: Negative for bruising/bleeding tendency and swollen glands.  Psychiatric/Behavioral: Negative for depressed mood and sleep disturbance. The patient is not nervous/anxious.     PMFS History:  Patient Active Problem List   Diagnosis Date Noted  . Primary osteoarthritis of both knees 01/12/2021  . Positive ANA (antinuclear antibody) 11/17/2020  . Joint pain 11/13/2020  . Elevated serum creatinine 08/26/2020  . Current use of proton pump inhibitor 08/26/2020  . Estrogen deficiency 08/21/2018  . TMJ (dislocation of temporomandibular joint), initial encounter 05/05/2018  . Anterior neck pain 05/05/2018  . Chronic pain syndrome 11/09/2017  . Chronic upper extremity pain [facility] Area of Pain) (Bilateral) (L>R) 11/09/2017  . Fibromyalgia syndrome 11/09/2017  . Osteoarthritis 11/09/2017  . Osteoarthritis of lumbar facet joint (Bilateral) 11/09/2017  . Lumbar facet arthropathy (Bilateral) 11/09/2017  . Lumbar facet syndrome (Bilateral) (L>R) 11/09/2017  . Lumbar foraminal stenosis (multilevel) (Bilateral) 11/09/2017  . DDD (degenerative disc disease), lumbar 11/09/2017  . Thoracic levoscoliosis 11/09/2017  . Thoracic facet syndrome (Bilateral) (L>R) 11/09/2017  . Thoracic facet arthropathy (Bilateral) (R>L) 11/09/2017  . DDD (degenerative disc disease), thoracic 11/09/2017  . Thoracolumbar IVDD 11/09/2017  . DDD (degenerative disc disease), cervical 11/09/2017  . Osteoarthritis of cervical facet (Bilateral) 11/09/2017  . Grade 1 Anterolisthesis of C7  over T1 11/09/2017  . Cervical foraminal stenosis  (Bilateral) 11/09/2017  . History of fusion of cervical spine (C5-6 ACDF) 11/09/2017  . Cervical facet syndrome (Bilateral) 11/09/2017  . Disorder of skeletal system 11/09/2017  . Cervical radiculitis (Bilateral) 11/09/2017  . Lumbar Epidural lipomatosis 11/09/2017  . Chronic sacroiliac joint pain (Bilateral) (L>R) 11/09/2017  . Chronic hip pain (Bilateral) (L>R) 11/09/2017  . Chronic upper back pain (Primary Area of Pain) (midline) 10/24/2017  . Chronic neck pain (Secondary Area of Pain) (Bilateral)  (L>R) 10/24/2017  . Chronic low back pain (Fourth Area of Pain) (Bilateral) (L>R) 10/24/2017  . Chronic lower extremity pain (Fifth Area of Pain) (Bilateral) (L>R) 10/24/2017  . Lumbar Grade 1 Retrolisthesis of L1-2 and L2-3 10/24/2017  . Other long term (current) drug therapy 10/24/2017  . Other specified health status 10/24/2017  . Long term current use of opiate analgesic 10/24/2017  . Long term prescription opiate use 10/24/2017  . Opiate use 10/24/2017  . DM type 2 (diabetes mellitus, type 2) ([place]) 06/14/2014  . Pharmacologic therapy 06/14/2014  . Pedal edema   . Post-menopausal 06/23/2012  . Lumbar disc disease with radiculopathy 11/18/2011  . HTN (hypertension) 07/30/2011  . Raynaud disease 07/30/2011  . Obesity 07/30/2011  . Routine general medical examination at a health care facility 03/28/2011  . Palpitations 04/27/2010  . Problems influencing health status 12/11/2009  . Depression with anxiety 06/14/2008  . Vitamin D deficiency 05/13/2008  . ANXIETY 03/25/2008  . Osteopenia 03/25/2008  . Hypothyroidism 03/28/2007  . Hyperlipidemia associated with type 2 diabetes mellitus ([place]) 03/28/2007  . Other iron deficiency anemias 03/28/2007  . PANIC ATTACK 03/28/2007  . KERATOCONJUNCTIVITIS SICCA 03/28/2007  . Mitral valve disorder 03/28/2007  . ABNORMAL HEART RHYTHMS 03/28/2007  . Raynaud's syndrome 03/28/2007  . GERD 03/28/2007  . IBS 03/28/2007  . Rosacea 03/28/2007  .  PLANTAR FASCIITIS 03/28/2007  . MIGRAINES, HX OF 03/28/2007    Past Medical History:  Diagnosis Date  . Acute renal insufficiency 05/31/2014  . Anemia, iron deficiency   . Bilateral lower extremity edema    a. uses torsemide  . Cervical dysplasia    abnormal paps  . Colon cancer screening 06/14/2014  . Cough 08/08/2012  . Degenerative disk disease 11/19/2011  . Diabetes mellitus type II    Diet controlled  . Diverticulosis   . DJD (degenerative joint disease)   . Drug rash 05/22/2011  . Dry eyes   . Edema   . Elevated liver enzymes 03/21/2012  . Encounter for routine gynecological examination 06/14/2014  . ESOPHAGITIS 03/28/2007   Qualifier: Hospitalized for  By: Marcelino Scot CMA, [place]    . Fibromyalgia   . GASTRITIS 03/28/2007   Qualifier: History of  By: Marcelino Scot CMA, [place] Bilberry    . GERD (gastroesophageal reflux disease)   . Hemorrhoids    external  . HLD (hyperlipidemia)   . HNP (herniated nucleus pulposus) 2/99   T6,7,8 with DJD  . HTN (hypertension)   . Hyperglycemia 05/13/2008   Qualifier: Diagnosis of  By: Glori Bickers MD, Carmell Austria   . Hypothyroidism   . Interstitial lung disease ([place])    ?  Marland Kitchen Left ovarian cyst    x 3, rupture  . Osteoarthritis    hands  . Osteopenia    mild-11/01; improved 12/05  . Other screening mammogram 08/18/2011  . Palpitations   . PERSONAL HISTORY ALLERGY UNSPEC MEDICINAL AGENT 03/18/2010   Qualifier: Diagnosis of  By: Glori Bickers MD, Carmell Austria   .  Raynaud's disease   . Recurrent HSV (herpes simplex virus)    lesions in nose  . Rhinitis 03/21/2012  . Routine general medical examination at a health care facility 03/28/2011    Family History  Problem Relation Age of Onset  . Emphysema Father        + smoker  . Lung cancer Mother        + smoker  . Coronary artery disease Mother        relatively young  . Lymphoma Sister   . Heart disease Brother   . Lymphoma Brother   . Parkinson's disease Brother   . Anemia Brother        aplastic   .  Diabetes Brother   . Leukemia Son   . Iron deficiency Daughter   . Breast cancer Neg Hx    Past Surgical History:  Procedure Laterality Date  . ABDOMINAL EXPLORATION SURGERY    . COLONOSCOPY  11/01   Diverticulosis; hemorrhoids  . HAND SURGERY     left thumb  . LASIK     bilateral   Social History   Social History Narrative   Married      1 child      Network engineer to Scientist, physiological at [employer]      Does regularly exercise      Daily caffeine use: 2/day         Immunization History  Administered Date(s) Administered  . Fluad Quad(high Dose 65+) 08/07/2020  . Influenza Split 06/19/2011  . Influenza Whole 08/01/2009  . Influenza, High Dose Seasonal PF 08/14/2018, 07/03/2019  . Influenza,inj,Quad PF,6+ Mos 06/14/2014, 08/25/2015, 07/26/2016, 08/19/2017  . Moderna Sars-Covid-2 Vaccination 09/17/2020  . PFIZER(Purple Top)SARS-COV-2 Vaccination 11/27/2019, 12/18/2019  . Pneumococcal Conjugate-13 08/25/2015  . Pneumococcal Polysaccharide-23 08/25/2016  . Td 11/25/2003  . Tdap 06/14/2014  . Zoster 09/16/2014     Objective: Vital Signs: BP (!) 145/87 (BP Location: Left Arm, Patient Position: Sitting, Cuff Size: Normal)   Pulse 81   Resp 17   Ht 5' 1.75" (1.568 m)   Wt 207 lb (93.9 kg)   BMI 38.17 kg/m    Physical Exam Vitals and nursing note reviewed.  Constitutional:      Appearance: She is well-developed.  HENT:     Head: Normocephalic and atraumatic.  Eyes:     Conjunctiva/sclera: Conjunctivae normal.  Cardiovascular:     Rate and Rhythm: Normal rate and regular rhythm.     Heart sounds: Normal heart sounds.  Pulmonary:     Effort: Pulmonary effort is normal.     Breath sounds: Normal breath sounds.  Abdominal:     General: Bowel sounds are normal.     Palpations: Abdomen is soft.  Musculoskeletal:     Cervical back: Normal range of motion.  Lymphadenopathy:     Cervical: No cervical adenopathy.  Skin:    General: Skin is warm and dry.     Capillary Refill:  Capillary refill takes less than 2 seconds.  Neurological:     Mental Status: She is alert and oriented to person, place, and time.  Psychiatric:        Behavior: Behavior normal.      Musculoskeletal Exam: C-spine was in limited range of motion.  Shoulder joints and elbow joints in good range of motion.  She had synovitis on palpation of her left wrist joint.  She synovitis over several of MCPs and PIPs as described below.  She has warmth and swelling in her right  knee joint and bilateral ankle joints.  CDAI Exam: CDAI Score: 15.4  Patient Global: 7 mm; Provider Global: 7 mm Swollen: 9 ; Tender: 9  Joint Exam 01/13/2021      Right  Left  Wrist     Swollen Tender  MCP 2  Swollen Tender     PIP 2  Swollen Tender     PIP 3  Swollen Tender     PIP 4  Swollen Tender     PIP 5  Swollen Tender     Knee  Swollen Tender     Ankle  Swollen Tender  Swollen Tender     Investigation: No additional findings.  Imaging: XR Hand 2 View Left  Result Date: 12/31/2020 Severe CMC narrowing and subluxation was noted.  PIP and DIP narrowing was noted.  No MCP, intercarpal or radiocarpal joint space narrowing was noted.  No erosive changes were noted. Impression: These findings are consistent with osteoarthritis of the hand.  Severe CMC narrowing and subluxation was noted.  XR Hand 2 View Right  Result Date: 12/31/2020 Mild PIP and DIP narrowing was noted.  No MCP, intercarpal or radiocarpal joint space narrowing was noted.  No erosive changes were noted. Impression: These findings are consistent with early osteoarthritis of the hand.  XR KNEE 3 VIEW LEFT  Result Date: 12/31/2020 Mild medial compartment narrowing was noted.  Mild patellofemoral narrowing was noted.  No chondrocalcinosis was noted. Impression: These findings are consistent with mild osteoarthritis and mild chondromalacia patella.  XR KNEE 3 VIEW RIGHT  Result Date: 12/31/2020 Mild medial compartment narrowing was noted.  Mild  patellofemoral narrowing was noted.  No chondrocalcinosis was noted. Impression: These findings are consistent with mild osteoarthritis and mild chondromalacia patella.    Recent Labs: Lab Results  Component Value Date   WBC 5.4 11/13/2020   HGB 12.4 11/13/2020   PLT 286.0 11/13/2020   NA 138 11/13/2020   K 4.9 11/13/2020   CL 101 11/13/2020   CO2 33 (H) 11/13/2020   GLUCOSE 101 (H) 11/13/2020   BUN 26 (H) 11/13/2020   CREATININE 1.15 11/13/2020   BILITOT 0.4 08/20/2020   ALKPHOS 68 08/20/2020   AST 21 08/20/2020   ALT 19 08/20/2020   PROT 7.7 08/20/2020   ALBUMIN 4.7 08/20/2020   CALCIUM 9.0 11/13/2020   GFRAA >60 04/14/2020    January 05, 2021 AVISE lupus index -3.0, ANA 1: 320 homogeneous, ENA negative, CB CAP negative, Jo 1 -, antihistone negative, anticardiolipin negative, beta-2 GP 1 -, antiphospholipid screen negative, RF equivocal, anti-CCP positive 242, anticarp positive, antithyroglobulin negative, anti-TPO negative   Speciality Comments: No specialty comments available.  Procedures:  No procedures performed Allergies: Keflex [cephalexin], Penicillins, Amitriptyline hcl, Atorvastatin, Benicar [olmesartan medoxomil], Ceftin [cefuroxime], Cetirizine hcl, Ciprofloxacin, Clonidine derivatives, Diovan [valsartan], Erythromycin, Etodolac, Fluoxetine hcl, Furosemide, Gabapentin, Naproxen sodium, Paroxetine, Pregabalin, Sulfonamide derivatives, Tetracycline, Venlafaxine, and Cephalexin   Assessment / Plan:     Visit Diagnoses: Rheumatoid arthritis of multiple sites with negative rheumatoid factor (HCC) - +CCP Ab, +CarP Ab, +ANA, inflammatory arthritis.  She has severe inflammatory arthritis involving multiple joints today.  She was on prednisone which she discontinued recently.  She is unable to make a complete fist with the right hand.  She has synovitis in her left wrist.  Right knee joint and bilateral wrist joint swelling was noted as described above.  Detailed counseling  guarding rheumatoid arthritis was provided.  Different treatment options and their side effects were discussed.  She  would not be able to use hydroxychloroquine as she is allergic to sulfa.  I discussed the option of methotrexate or Arava.  Indications side effects contraindications were discussed at length.  On further questioning she mentioned that she had abnormal CT scan in the past.  I reviewed the chart and she had questionable ILD on the CT scan in 2013.  I will repeat high-resolution CT to rule out ILD.  If she does not have ILD we can proceed with methotrexate otherwise Imuran on anti-TNF's may be other option.  Indications side effects contraindications of methotrexate were discussed at length.  Handout was given and consent was taken.  My plan is to start on methotrexate if her labs are within normal limits and high-resolution CT is normal.  Drug Counseling TB Gold: Pending  Hepatitis panel: Pending  Chest-xray: Pending  Contraception: Not applicable  Alcohol use: None  Patient was counseled on the purpose, proper use, and adverse effects of methotrexate including nausea, infection, and signs and symptoms of pneumonitis.  Reviewed instructions with patient to take methotrexate weekly along with folic acid daily.  Discussed the importance of frequent monitoring of kidney and liver function and blood counts, and provided patient with standing lab instructions.  Counseled patient to avoid NSAIDs and alcohol while on methotrexate.  Provided patient with educational materials on methotrexate and answered all questions.  Advised patient to get annual influenza vaccine and to get a pneumococcal vaccine if patient has not already had one.  Patient voiced understanding.  Patient consented to methotrexate use.  Will upload into chart.    High risk medication use - Plan: Hepatitis B core antibody, IgM, Hepatitis B surface antigen, Hepatitis C antibody, HIV Antibody (routine testing w rflx),  QuantiFERON-TB Gold Plus, Serum protein electrophoresis with reflex, IgG, IgA, IgM, Thiopurine methyltransferase(tpmt)rbc  Shortness of breath -she complains of shortness of breath on exertion.  I reviewed her chest x-ray and CT scan from 2013 and there was suspicion of interstitial lung disease.  A follow-up CT scan was recommended.  Plan: CT Chest High Resolution  Arthralgia of both hands -she has severe inflammatory arthritis in her hands and wrist joints today.  Positive ANA, RF negative, anti-CCP positive, anticarP positive.  X-ray showed right severe CMC narrowing and subluxation, PIP and DIP narrowing. - Plan: Uric acid  Raynaud's disease without gangrene-she complains of mild Raynaud's symptoms.  Primary osteoarthritis of both knees - Bilateral mild osteoarthritis and mild chondromalacia patella was noted.  DDD (degenerative disc disease), cervical-she has limited range of motion of her cervical spine.  History of fusion of cervical spine (C5-6 ACDF)  DDD (degenerative disc disease), thoracic-she has thoracic kyphosis and chronic discomfort.  DDD (degenerative disc disease), lumbar-limited range of motion and discomfort.  Fibromyalgia syndrome -she has known history of fibromyalgia syndrome.  Plan: CK  Chronic pain syndrome-she goes to pain management.  Other medical problems are listed as follows:  Long term current use of opiate analgesic  Primary hypertension  Mitral valve disorder  Hyperlipidemia associated with type 2 diabetes mellitus (HCC)-she has been advised to monitor blood glucose levels carefully while she is on prednisone taper.  Type 2 diabetes mellitus without complication, without long-term current use of insulin (HCC)  History of gastroesophageal reflux (GERD)  History of IBS  History of hypothyroidism  Depression with anxiety  Orders: Orders Placed This Encounter  Procedures  . CT Chest High Resolution  . Hepatitis B core antibody, IgM  .  Hepatitis B surface antigen  .  Hepatitis C antibody  . HIV Antibody (routine testing w rflx)  . QuantiFERON-TB Gold Plus  . Serum protein electrophoresis with reflex  . IgG, IgA, IgM  . CK  . Uric acid  . Thiopurine methyltransferase(tpmt)rbc   Meds ordered this encounter  Medications  . predniSONE (DELTASONE) 5 MG tablet    Sig: Take 2 tabs po am x 7 days, 1.5  tabs po am  x 7 days, 1  tabs po am x 7 days, .5  tab po am x 7 days    Dispense:  35 tablet    Refill:  0     Follow-Up Instructions: Return in about 6 weeks (around 02/24/2021) for Rheumatoid arthritis.   Bo Merino, MD  Note - This record has been created using Editor, commissioning.  Chart creation errors have been sought, but may not always  have been located. Such creation errors do not reflect on  the standard of medical care.

## 2021-01-12 NOTE — Telephone Encounter (Signed)
Please schedule urgent new patient follow up appointment with Dr. Estanislado Pandy tomorrow to discuss lab work and for further evaluation.

## 2021-01-13 ENCOUNTER — Ambulatory Visit: Payer: Medicare Other | Admitting: Rheumatology

## 2021-01-13 ENCOUNTER — Other Ambulatory Visit: Payer: Self-pay

## 2021-01-13 ENCOUNTER — Encounter: Payer: Self-pay | Admitting: Rheumatology

## 2021-01-13 VITALS — BP 145/87 | HR 81 | Resp 17 | Ht 61.75 in | Wt 207.0 lb

## 2021-01-13 DIAGNOSIS — E1169 Type 2 diabetes mellitus with other specified complication: Secondary | ICD-10-CM

## 2021-01-13 DIAGNOSIS — M5134 Other intervertebral disc degeneration, thoracic region: Secondary | ICD-10-CM | POA: Diagnosis not present

## 2021-01-13 DIAGNOSIS — Z981 Arthrodesis status: Secondary | ICD-10-CM

## 2021-01-13 DIAGNOSIS — Z79899 Other long term (current) drug therapy: Secondary | ICD-10-CM

## 2021-01-13 DIAGNOSIS — Z8639 Personal history of other endocrine, nutritional and metabolic disease: Secondary | ICD-10-CM

## 2021-01-13 DIAGNOSIS — Z79891 Long term (current) use of opiate analgesic: Secondary | ICD-10-CM

## 2021-01-13 DIAGNOSIS — E119 Type 2 diabetes mellitus without complications: Secondary | ICD-10-CM

## 2021-01-13 DIAGNOSIS — M797 Fibromyalgia: Secondary | ICD-10-CM

## 2021-01-13 DIAGNOSIS — M5136 Other intervertebral disc degeneration, lumbar region: Secondary | ICD-10-CM | POA: Diagnosis not present

## 2021-01-13 DIAGNOSIS — R0602 Shortness of breath: Secondary | ICD-10-CM | POA: Diagnosis not present

## 2021-01-13 DIAGNOSIS — G894 Chronic pain syndrome: Secondary | ICD-10-CM

## 2021-01-13 DIAGNOSIS — M25541 Pain in joints of right hand: Secondary | ICD-10-CM

## 2021-01-13 DIAGNOSIS — I10 Essential (primary) hypertension: Secondary | ICD-10-CM

## 2021-01-13 DIAGNOSIS — I7300 Raynaud's syndrome without gangrene: Secondary | ICD-10-CM

## 2021-01-13 DIAGNOSIS — M0609 Rheumatoid arthritis without rheumatoid factor, multiple sites: Secondary | ICD-10-CM

## 2021-01-13 DIAGNOSIS — M25542 Pain in joints of left hand: Secondary | ICD-10-CM

## 2021-01-13 DIAGNOSIS — M170 Bilateral primary osteoarthritis of knee: Secondary | ICD-10-CM

## 2021-01-13 DIAGNOSIS — F418 Other specified anxiety disorders: Secondary | ICD-10-CM

## 2021-01-13 DIAGNOSIS — M5030 Other cervical disc degeneration, unspecified cervical region: Secondary | ICD-10-CM

## 2021-01-13 DIAGNOSIS — E785 Hyperlipidemia, unspecified: Secondary | ICD-10-CM

## 2021-01-13 DIAGNOSIS — R768 Other specified abnormal immunological findings in serum: Secondary | ICD-10-CM

## 2021-01-13 DIAGNOSIS — Z8719 Personal history of other diseases of the digestive system: Secondary | ICD-10-CM

## 2021-01-13 DIAGNOSIS — I059 Rheumatic mitral valve disease, unspecified: Secondary | ICD-10-CM

## 2021-01-13 MED ORDER — PREDNISONE 5 MG PO TABS: ORAL_TABLET | ORAL | 0 refills | Status: DC

## 2021-01-13 NOTE — Patient Instructions (Signed)
Methotrexate tablets What is this medicine? METHOTREXATE (METH oh TREX ate) is a chemotherapy drug used to treat cancer including breast cancer, leukemia, and lymphoma. This medicine can also be used to treat psoriasis and certain kinds of arthritis. This medicine may be used for other purposes; ask your health care provider or pharmacist if you have questions. COMMON BRAND NAME(S): Rheumatrex, Trexall What should I tell my health care provider before I take this medicine? They need to know if you have any of these conditions:  fluid in the stomach area or lungs  if you often drink alcohol  infection or immune system problems  kidney disease or on hemodialysis  liver disease  low blood counts, like low white cell, platelet, or red cell counts  lung disease  radiation therapy  stomach ulcers  ulcerative colitis  an unusual or allergic reaction to methotrexate, other medicines, foods, dyes, or preservatives  pregnant or trying to get pregnant  breast-feeding How should I use this medicine? Take this medicine by mouth with a glass of water. Follow the directions on the prescription label. Take your medicine at regular intervals. Do not take it more often than directed. Do not stop taking except on your doctor's advice. Make sure you know why you are taking this medicine and how often you should take it. If this medicine is used for a condition that is not cancer, like arthritis or psoriasis, it should be taken weekly, NOT daily. Taking this medicine more often than directed can cause serious side effects, even death. Talk to your healthcare provider about safe handling and disposal of this medicine. You may need to take special precautions. Talk to your pediatrician regarding the use of this medicine in children. While this drug may be prescribed for selected conditions, precautions do apply. Overdosage: If you think you have taken too much of this medicine contact a poison control  center or emergency room at once. NOTE: This medicine is only for you. Do not share this medicine with others. What if I miss a dose? If you miss a dose, talk with your doctor or health care professional. Do not take double or extra doses. What may interact with this medicine? Do not take this medicine with any of the following medications:  acitretin This medicine may also interact with the following medication:  aspirin and aspirin-like medicines including salicylates  azathioprine  certain antibiotics like penicillins, tetracycline, and chloramphenicol  certain medicines that treat or prevent blood clots like warfarin, apixaban, dabigatran, and rivaroxaban  certain medicines for stomach problems like esomeprazole, omeprazole, pantoprazole  cyclosporine  dapsone  diuretics  gold  hydroxychloroquine  live virus vaccines  medicines for infection like acyclovir, adefovir, amphotericin B, bacitracin, cidofovir, foscarnet, ganciclovir, gentamicin, pentamidine, vancomycin  mercaptopurine  NSAIDs, medicines for pain and inflammation, like ibuprofen or naproxen  other cytotoxic agents  pamidronate  pemetrexed  penicillamine  phenylbutazone  phenytoin  probenecid  pyrimethamine  retinoids such as isotretinoin and tretinoin  steroid medicines like prednisone or cortisone  sulfonamides like sulfasalazine and trimethoprim/sulfamethoxazole  theophylline  zoledronic acid This list may not describe all possible interactions. Give your health care provider a list of all the medicines, herbs, non-prescription drugs, or dietary supplements you use. Also tell them if you smoke, drink alcohol, or use illegal drugs. Some items may interact with your medicine. What should I watch for while using this medicine? Avoid alcoholic drinks. This medicine can make you more sensitive to the sun. Keep out of the sun.   If you cannot avoid being in the sun, wear protective clothing  and use sunscreen. Do not use sun lamps or tanning beds/booths. You may need blood work done while you are taking this medicine. Call your doctor or health care professional for advice if you get a fever, chills or sore throat, or other symptoms of a cold or flu. Do not treat yourself. This drug decreases your body's ability to fight infections. Try to avoid being around people who are sick. This medicine may increase your risk to bruise or bleed. Call your doctor or health care professional if you notice any unusual bleeding. Be careful brushing or flossing your teeth or using a toothpick because you may get an infection or bleed more easily. If you have any dental work done, tell your dentist you are receiving this medicine. Check with your doctor or health care professional if you get an attack of severe diarrhea, nausea and vomiting, or if you sweat a lot. The loss of too much body fluid can make it dangerous for you to take this medicine. Talk to your doctor about your risk of cancer. You may be more at risk for certain types of cancers if you take this medicine. Do not become pregnant while taking this medicine or for 6 months after stopping it. Women should inform their health care provider if they wish to become pregnant or think they might be pregnant. Men should not father a child while taking this medicine and for 3 months after stopping it. There is potential for serious harm to an unborn child. Talk to your health care provider for more information. Do not breast-feed an infant while taking this medicine or for 1 week after stopping it. This medicine may make it more difficult to get pregnant or father a child. Talk to your health care provider if you are concerned about your fertility. What side effects may I notice from receiving this medicine? Side effects that you should report to your doctor or health care professional as soon as possible:  allergic reactions like skin rash, itching or  hives, swelling of the face, lips, or tongue  breathing problems or shortness of breath  diarrhea  dry, nonproductive cough  low blood counts - this medicine may decrease the number of white blood cells, red blood cells and platelets. You may be at increased risk for infections and bleeding.  mouth sores  redness, blistering, peeling or loosening of the skin, including inside the mouth  signs of infection - fever or chills, cough, sore throat, pain or trouble passing urine  signs and symptoms of bleeding such as bloody or black, tarry stools; red or dark-brown urine; spitting up blood or brown material that looks like coffee grounds; red spots on the skin; unusual bruising or bleeding from the eye, gums, or nose  signs and symptoms of kidney injury like trouble passing urine or change in the amount of urine  signs and symptoms of liver injury like dark yellow or brown urine; general ill feeling or flu-like symptoms; light-colored stools; loss of appetite; nausea; right upper belly pain; unusually weak or tired; yellowing of the eyes or skin Side effects that usually do not require medical attention (report to your doctor or health care professional if they continue or are bothersome):  dizziness  hair loss  tiredness  upset stomach  vomiting This list may not describe all possible side effects. Call your doctor for medical advice about side effects. You may report side effects to  FDA at 1-800-FDA-1088. Where should I keep my medicine? Keep out of the reach of children and pets. Store at room temperature between 20 and 25 degrees C (68 and 77 degrees F). Protect from light. Get rid of any unused medicine after the expiration date. Talk to your health care provider about how to dispose of unused medicine. Special directions may apply. NOTE: This sheet is a summary. It may not cover all possible information. If you have questions about this medicine, talk to your doctor, pharmacist,  or health care provider.  2021 [employer] (2020-05-05 10:40:39)  Standing Labs We placed an order today for your standing lab work.   Please have your standing labs drawn in weeks x2 and then every 3 months.  If possible, please have your labs drawn 2 weeks prior to your appointment so that the provider can discuss your results at your appointment.  We have open lab daily Monday through Thursday from 1:30-4:30 PM and Friday from 1:30-4:00 PM at the office of Dr. Bo Merino, [facility].   Please be advised, all patients with office appointments requiring lab work will take precedents over walk-in lab work.  If possible, please come for your lab work on Monday and Friday afternoons, as you may experience shorter wait times. The office is located at [address] [place], [place], [place] [address] No appointment is necessary.   Labs are drawn by [facility]. Please bring your co-pay at the time of your lab draw.  You may receive a bill from [facility] for your lab work.  If you wish to have your labs drawn at another location, please call the office 24 hours in advance to send orders.  If you have any questions regarding directions or hours of operation,  please call [phone].   As a reminder, please drink plenty of water prior to coming for your lab work. Thanks!

## 2021-01-14 ENCOUNTER — Telehealth: Payer: Self-pay | Admitting: *Deleted

## 2021-01-14 NOTE — Telephone Encounter (Signed)
Consent for MTX signed and sent to scan place

## 2021-01-16 ENCOUNTER — Ambulatory Visit
Admission: RE | Admit: 2021-01-16 | Discharge: 2021-01-16 | Disposition: A | Discharge status: Home / Self Care | Payer: Medicare Other | Source: Ambulatory Visit | Attending: Rheumatology | Admitting: Rheumatology

## 2021-01-16 ENCOUNTER — Other Ambulatory Visit: Payer: Self-pay

## 2021-01-16 DIAGNOSIS — R0602 Shortness of breath: Secondary | ICD-10-CM

## 2021-01-19 LAB — THIOPURINE METHYLTRANSFERASE (TPMT), RBC: Thiopurine Methyltransferase, RBC: 20 nmol/hr/mL RBC

## 2021-01-19 LAB — QUANTIFERON-TB GOLD PLUS
Mitogen-NIL: >10 IU/mL
NIL: 0.06 IU/mL
QuantiFERON-TB Gold Plus: NEGATIVE
TB1-NIL: 0.01 IU/mL
TB2-NIL: 0 IU/mL

## 2021-01-19 LAB — PROTEIN ELECTROPHORESIS, SERUM, WITH REFLEX
Albumin ELP: 3.7 g/dL — ABNORMAL LOW (ref 3.8–4.8)
Alpha 1: 0.5 g/dL — ABNORMAL HIGH (ref 0.2–0.3)
Alpha 2: 1.1 g/dL — ABNORMAL HIGH (ref 0.5–0.9)
Beta 2: 0.3 g/dL (ref 0.2–0.5)
Beta Globulin: 0.4 g/dL (ref 0.4–0.6)
Gamma Globulin: 1.1 g/dL (ref 0.8–1.7)
Total Protein: 7.2 g/dL (ref 6.1–8.1)

## 2021-01-19 LAB — HEPATITIS B SURFACE ANTIGEN: Hepatitis B Surface Ag: NONREACTIVE

## 2021-01-19 LAB — CK: Total CK: 109 U/L (ref 29–143)

## 2021-01-19 LAB — URIC ACID: Uric Acid, Serum: 6 mg/dL (ref 2.5–7.0)

## 2021-01-19 LAB — IGG, IGA, IGM
IgG (Immunoglobin G), Serum: 1246 mg/dL (ref 600–1540)
IgM, Serum: 92 mg/dL (ref 50–300)
Immunoglobulin A: 156 mg/dL (ref 70–320)

## 2021-01-19 LAB — HIV ANTIBODY (ROUTINE TESTING W REFLEX): HIV 1&2 Ab, 4th Generation: NONREACTIVE

## 2021-01-19 LAB — HEPATITIS C ANTIBODY
Hepatitis C Ab: NONREACTIVE
SIGNAL TO CUT-OFF: 0.02 (ref <1.00)

## 2021-01-19 LAB — HEPATITIS B CORE ANTIBODY, IGM: Hep B C IgM: NONREACTIVE

## 2021-01-20 ENCOUNTER — Telehealth: Payer: Self-pay | Admitting: Internal Medicine

## 2021-01-20 ENCOUNTER — Other Ambulatory Visit: Payer: Self-pay | Admitting: *Deleted

## 2021-01-20 DIAGNOSIS — J849 Interstitial pulmonary disease, unspecified: Secondary | ICD-10-CM

## 2021-01-20 NOTE — Telephone Encounter (Signed)
Tina Berger  Dr Estanislado Pandy just messaed me. Tina Berger has UIP/ILD iwht RA. PAtient to see me in May 2022 after PFT. Needs to do ILD questionnaire prior  Thanks  MR

## 2021-01-20 NOTE — Progress Notes (Signed)
Office Visit Note  Patient: Tina Berger             Date of Birth: 01-07-1951           MRN: [id number]             PCP: Abner Greenspan, MD Referring: Tower, [name], MD Visit Date: 01/21/2021 Occupation: @GUAROCC @  Subjective:  Discuss starting on Imuran   History of Present Illness: Tina Berger is a 70 y.o. female with history of seropositive rheumatoid arthritis, ILD, osteoarthritis, and DDD. She presents today to discuss lab work and HR chest CT results.   She is currently taking prednisone 7.5 mg by mouth daily and is tapering by 2.5 mg every week.  She is has noticed increased pain and swelling in both hands and both wrist joints since tapering the dose of prednisone from 10 mg to 7.5 mg daily.  She is also having pain in the elbow joint, right knee, and both ankle joints.  She has generalized muscle aches and muscle tenderness due to fibromyalgia.  She presents today to discuss starting on imuran.    Activities of Daily Living:  Patient reports morning stiffness for several hours.   Patient Reports nocturnal pain.  Difficulty dressing/grooming: Reports Difficulty climbing stairs: Denies Difficulty getting out of chair: Denies Difficulty using hands for taps, buttons, cutlery, and/or writing: Reports  Review of Systems  Constitutional: Positive for fatigue.  HENT: Positive for mouth sores and mouth dryness. Negative for nose dryness.   Eyes: Positive for dryness. Negative for pain and itching.  Respiratory: Positive for shortness of breath. Negative for difficulty breathing.   Cardiovascular: Negative for chest pain and palpitations.  Gastrointestinal: Negative for blood in stool, constipation and diarrhea.  Endocrine: Negative for increased urination.  Genitourinary: Negative for difficulty urinating.  Musculoskeletal: Positive for arthralgias, joint pain, joint swelling, myalgias, morning stiffness, muscle tenderness and myalgias.  Skin: Negative for color change,  rash and redness.  Allergic/Immunologic: Negative for susceptible to infections.  Neurological: Positive for weakness. Negative for dizziness, numbness, headaches and memory loss.  Hematological: Negative for bruising/bleeding tendency.  Psychiatric/Behavioral: Negative for confusion.    PMFS History:  Patient Active Problem List   Diagnosis Date Noted  . Primary osteoarthritis of both knees 01/12/2021  . Positive ANA (antinuclear antibody) 11/17/2020  . Joint pain 11/13/2020  . Elevated serum creatinine 08/26/2020  . Current use of proton pump inhibitor 08/26/2020  . Estrogen deficiency 08/21/2018  . TMJ (dislocation of temporomandibular joint), initial encounter 05/05/2018  . Anterior neck pain 05/05/2018  . Chronic pain syndrome 11/09/2017  . Chronic upper extremity pain [facility]) (Bilateral) (L>R) 11/09/2017  . Fibromyalgia syndrome 11/09/2017  . Osteoarthritis 11/09/2017  . Osteoarthritis of lumbar facet joint (Bilateral) 11/09/2017  . Lumbar facet arthropathy (Bilateral) 11/09/2017  . Lumbar facet syndrome (Bilateral) (L>R) 11/09/2017  . Lumbar foraminal stenosis (multilevel) (Bilateral) 11/09/2017  . DDD (degenerative disc disease), lumbar 11/09/2017  . Thoracic levoscoliosis 11/09/2017  . Thoracic facet syndrome (Bilateral) (L>R) 11/09/2017  . Thoracic facet arthropathy (Bilateral) (R>L) 11/09/2017  . DDD (degenerative disc disease), thoracic 11/09/2017  . Thoracolumbar IVDD 11/09/2017  . DDD (degenerative disc disease), cervical 11/09/2017  . Osteoarthritis of cervical facet (Bilateral) 11/09/2017  . Grade 1 Anterolisthesis of C7 over T1 11/09/2017  . Cervical foraminal stenosis (Bilateral) 11/09/2017  . History of fusion of cervical spine (C5-6 ACDF) 11/09/2017  . Cervical facet syndrome (Bilateral) 11/09/2017  . Disorder of skeletal system  11/09/2017  . Cervical radiculitis (Bilateral) 11/09/2017  . Lumbar Epidural lipomatosis 11/09/2017  . Chronic  sacroiliac joint pain (Bilateral) (L>R) 11/09/2017  . Chronic hip pain (Bilateral) (L>R) 11/09/2017  . Chronic upper back pain (Primary Area of Pain) (midline) 10/24/2017  . Chronic neck pain (Secondary Area of Pain) (Bilateral)  (L>R) 10/24/2017  . Chronic low back pain (Fourth Area of Pain) (Bilateral) (L>R) 10/24/2017  . Chronic lower extremity pain (Fifth Area of Pain) (Bilateral) (L>R) 10/24/2017  . Lumbar Grade 1 Retrolisthesis of L1-2 and L2-3 10/24/2017  . Other long term (current) drug therapy 10/24/2017  . Other specified health status 10/24/2017  . Long term current use of opiate analgesic 10/24/2017  . Long term prescription opiate use 10/24/2017  . Opiate use 10/24/2017  . DM type 2 (diabetes mellitus, type 2) ([place]) 06/14/2014  . Pharmacologic therapy 06/14/2014  . Pedal edema   . Post-menopausal 06/23/2012  . Lumbar disc disease with radiculopathy 11/18/2011  . HTN (hypertension) 07/30/2011  . Raynaud disease 07/30/2011  . Obesity 07/30/2011  . Routine general medical examination at a health care facility 03/28/2011  . Palpitations 04/27/2010  . Problems influencing health status 12/11/2009  . Depression with anxiety 06/14/2008  . Vitamin D deficiency 05/13/2008  . ANXIETY 03/25/2008  . Osteopenia 03/25/2008  . Hypothyroidism 03/28/2007  . Hyperlipidemia associated with type 2 diabetes mellitus ([place]) 03/28/2007  . Other iron deficiency anemias 03/28/2007  . PANIC ATTACK 03/28/2007  . KERATOCONJUNCTIVITIS SICCA 03/28/2007  . Mitral valve disorder 03/28/2007  . ABNORMAL HEART RHYTHMS 03/28/2007  . Raynaud's syndrome 03/28/2007  . GERD 03/28/2007  . IBS 03/28/2007  . Rosacea 03/28/2007  . PLANTAR FASCIITIS 03/28/2007  . MIGRAINES, HX OF 03/28/2007    Past Medical History:  Diagnosis Date  . Acute renal insufficiency 05/31/2014  . Anemia, iron deficiency   . Bilateral lower extremity edema    a. uses torsemide  . Cervical dysplasia    abnormal paps  . Colon  cancer screening 06/14/2014  . Cough 08/08/2012  . Degenerative disk disease 11/19/2011  . Diabetes mellitus type II    Diet controlled  . Diverticulosis   . DJD (degenerative joint disease)   . Drug rash 05/22/2011  . Dry eyes   . Edema   . Elevated liver enzymes 03/21/2012  . Encounter for routine gynecological examination 06/14/2014  . ESOPHAGITIS 03/28/2007   Qualifier: Hospitalized for  By: Marcelino Scot CMA, [place]    . Fibromyalgia   . GASTRITIS 03/28/2007   Qualifier: History of  By: Marcelino Scot CMA, [place] Bilberry    . GERD (gastroesophageal reflux disease)   . Hemorrhoids    external  . HLD (hyperlipidemia)   . HNP (herniated nucleus pulposus) 2/99   T6,7,8 with DJD  . HTN (hypertension)   . Hyperglycemia 05/13/2008   Qualifier: Diagnosis of  By: Glori Bickers MD, Carmell Austria   . Hypothyroidism   . Interstitial lung disease ([place])    ?  Marland Kitchen Left ovarian cyst    x 3, rupture  . Osteoarthritis    hands  . Osteopenia    mild-11/01; improved 12/05  . Other screening mammogram 08/18/2011  . Palpitations   . PERSONAL HISTORY ALLERGY UNSPEC MEDICINAL AGENT 03/18/2010   Qualifier: Diagnosis of  By: Glori Bickers MD, Carmell Austria   . Raynaud's disease   . Recurrent HSV (herpes simplex virus)    lesions in nose  . Rhinitis 03/21/2012  . Routine general medical examination at a health care facility 03/28/2011  Family History  Problem Relation Age of Onset  . Emphysema Father        + smoker  . Lung cancer Mother        + smoker  . Coronary artery disease Mother        relatively young  . Lymphoma Sister   . Heart disease Brother   . Lymphoma Brother   . Parkinson's disease Brother   . Anemia Brother        aplastic   . Diabetes Brother   . Leukemia Son   . Iron deficiency Daughter   . Breast cancer Neg Hx    Past Surgical History:  Procedure Laterality Date  . ABDOMINAL EXPLORATION SURGERY    . COLONOSCOPY  11/01   Diverticulosis; hemorrhoids  . HAND SURGERY     left thumb  . LASIK      bilateral   Social History   Social History Narrative   Married      1 child      Network engineer to Scientist, physiological at [employer]      Does regularly exercise      Daily caffeine use: 2/day         Immunization History  Administered Date(s) Administered  . Fluad Quad(high Dose 65+) 08/07/2020  . Influenza Split 06/19/2011  . Influenza Whole 08/01/2009  . Influenza, High Dose Seasonal PF 08/14/2018, 07/03/2019  . Influenza,inj,Quad PF,6+ Mos 06/14/2014, 08/25/2015, 07/26/2016, 08/19/2017  . Moderna Sars-Covid-2 Vaccination 09/17/2020  . PFIZER(Purple Top)SARS-COV-2 Vaccination 11/27/2019, 12/18/2019  . Pneumococcal Conjugate-13 08/25/2015  . Pneumococcal Polysaccharide-23 08/25/2016  . Td 11/25/2003  . Tdap 06/14/2014  . Zoster 09/16/2014     Objective: Vital Signs: BP 125/83 (BP Location: Left Arm, Patient Position: Sitting, Cuff Size: Normal)   Pulse 71   Resp 16   Ht 5' 1.75" (1.568 m)   Wt 199 lb 12.8 oz (90.6 kg)   BMI 36.84 kg/m    Physical Exam Vitals and nursing note reviewed.  Constitutional:      Appearance: She is well-developed.  HENT:     Head: Normocephalic and atraumatic.  Eyes:     Conjunctiva/sclera: Conjunctivae normal.  Pulmonary:     Effort: Pulmonary effort is normal.  Abdominal:     Palpations: Abdomen is soft.  Musculoskeletal:     Cervical back: Normal range of motion.  Skin:    General: Skin is warm and dry.     Capillary Refill: Capillary refill takes less than 2 seconds.  Neurological:     Mental Status: She is alert and oriented to person, place, and time.  Psychiatric:        Behavior: Behavior normal.      Musculoskeletal Exam: Generalized hyperalgesia and positive tender points. C-spine limited ROM with lateral rotation. Shoulder joints and elbow joints good ROM.  Tenderness over the elbow joint line.  Tenderness and synovitis over the left wrist.  Tenderness and synovitis of the right 2nd MCP and PIP joint.  Tenderness over the right DIP of  the 2nd and 4th digits.  Tenderness and synovitis of the right 3rd and 4th PIP joints.  Hip joints good ROM.  Tenderness over bilateral trochanteric bursa.  Knee joints good ROM with no warmth or effusion.  Tenderness over both ankle joints.  No tenderness over MTP joints.  Tenderness along the plantar fascia bilaterally.   CDAI Exam: CDAI Score: 0.8  Patient Global: 4 mm; Provider Global: 4 mm Swollen: 0 ; Tender: 0  Joint Exam 01/21/2021  No joint exam has been documented for this visit   There is currently no information documented on the homunculus. Go to the Rheumatology activity and complete the homunculus joint exam.  Investigation: No additional findings.  Imaging: CT Chest High Resolution  Result Date: 01/19/2021 CLINICAL DATA:  70 year old female with history of shortness of breath. History of rheumatoid arthritis. Shortness of breath with activity. EXAM: CT CHEST WITHOUT CONTRAST TECHNIQUE: Multidetector CT imaging of the chest was performed following the standard protocol without intravenous contrast. High resolution imaging of the lungs, as well as inspiratory and expiratory imaging, was performed. COMPARISON:  Chest CT 04/03/2012. FINDINGS: Cardiovascular: Heart size is normal. There is no significant pericardial fluid, thickening or pericardial calcification. There is aortic atherosclerosis, as well as atherosclerosis of the great vessels of the mediastinum and the coronary arteries, including calcified atherosclerotic plaque in the left main, left anterior descending and right coronary arteries. Mediastinum/Nodes: No pathologically enlarged mediastinal or hilar lymph nodes. Please note that accurate exclusion of hilar adenopathy is limited on noncontrast CT scans. Esophagus is unremarkable in appearance. No axillary lymphadenopathy. Lungs/Pleura: High-resolution images demonstrate patchy areas of ground-glass attenuation, septal thickening, mild subpleural reticulation and  peripheral bronchiolectasis scattered throughout the lungs bilaterally. Findings have a definitive craniocaudal gradient and appear mildly progressive compared to the prior study, with slightly less ground-glass attenuation but more septal thickening and subpleural reticulation. No frank honeycombing. Inspiratory and expiratory imaging demonstrates some mild air trapping indicative of small airways disease. In addition, there is collapse of the trachea and mainstem bronchi during expiration. No acute consolidative airspace disease. No pleural effusions. No suspicious appearing pulmonary nodules or masses are noted. Upper Abdomen: Unremarkable. Musculoskeletal: There are no aggressive appearing lytic or blastic lesions noted in the visualized portions of the skeleton. IMPRESSION: 1. The appearance of the lungs is compatible with interstitial lung disease which is mildly progressive when compared to remote prior study from 9 years ago. The spectrum of findings on today's examination is categorized as probable usual interstitial pneumonia (UIP) per current ATS guidelines. 2. Aortic atherosclerosis, in addition to left main and 2 vessel coronary artery disease. Assessment for potential risk factor modification, dietary therapy or pharmacologic therapy may be warranted, if clinically indicated. Aortic Atherosclerosis (ICD10-I70.0). Electronically Signed   By: Vinnie Langton M.D.   On: 01/19/2021 18:20   XR Hand 2 View Left  Result Date: 12/31/2020 Severe CMC narrowing and subluxation was noted.  PIP and DIP narrowing was noted.  No MCP, intercarpal or radiocarpal joint space narrowing was noted.  No erosive changes were noted. Impression: These findings are consistent with osteoarthritis of the hand.  Severe CMC narrowing and subluxation was noted.  XR Hand 2 View Right  Result Date: 12/31/2020 Mild PIP and DIP narrowing was noted.  No MCP, intercarpal or radiocarpal joint space narrowing was noted.  No erosive  changes were noted. Impression: These findings are consistent with early osteoarthritis of the hand.  XR KNEE 3 VIEW LEFT  Result Date: 12/31/2020 Mild medial compartment narrowing was noted.  Mild patellofemoral narrowing was noted.  No chondrocalcinosis was noted. Impression: These findings are consistent with mild osteoarthritis and mild chondromalacia patella.  XR KNEE 3 VIEW RIGHT  Result Date: 12/31/2020 Mild medial compartment narrowing was noted.  Mild patellofemoral narrowing was noted.  No chondrocalcinosis was noted. Impression: These findings are consistent with mild osteoarthritis and mild chondromalacia patella.    Recent Labs: Lab Results  Component Value Date   WBC 5.4 11/13/2020  HGB 12.4 11/13/2020   PLT 286.0 11/13/2020   NA 138 11/13/2020   K 4.9 11/13/2020   CL 101 11/13/2020   CO2 33 (H) 11/13/2020   GLUCOSE 101 (H) 11/13/2020   BUN 26 (H) 11/13/2020   CREATININE 1.15 11/13/2020   BILITOT 0.4 08/20/2020   ALKPHOS 68 08/20/2020   AST 21 08/20/2020   ALT 19 08/20/2020   PROT 7.2 01/13/2021   ALBUMIN 4.7 08/20/2020   CALCIUM 9.0 11/13/2020   GFRAA >60 04/14/2020   QFTBGOLDPLUS NEGATIVE 01/13/2021    Speciality Comments: No specialty comments available.  Procedures:  No procedures performed Allergies: Keflex [cephalexin], Penicillins, Amitriptyline hcl, Atorvastatin, Benicar [olmesartan medoxomil], Ceftin [cefuroxime], Cetirizine hcl, Ciprofloxacin, Clonidine derivatives, Diovan [valsartan], Erythromycin, Etodolac, Fluoxetine hcl, Furosemide, Gabapentin, Naproxen sodium, Paroxetine, Pregabalin, Sulfonamide derivatives, Tetracycline, Venlafaxine, and Cephalexin   TPMT WNL on 01/13/21.   Standing orders for CBC and CMP placed today.  CBC and CMP will be updated today prior to starting on Imuran.  She will start on Imuran 50 mg 1 tablet by mouth daily for 2 weeks and if labs are stable she will increase to 100 mg daily.    Assessment / Plan:     Visit  Diagnoses: Rheumatoid arthritis of multiple sites with negative rheumatoid factor ([place]): +CCP Ab, +CarP Ab, +ANA, inflammatory arthritis: She presents today with ongoing joint tenderness, stiffness, and synovitis in multiple joints as described above.  She is currently on a prednisone taper and is on 7.5 mg daily and tapering by 2.5 mg every week.  Lab work from 01/13/2021 was reviewed with the patient today in the office and all questions were addressed.  The initial plan was to start her on methotrexate pending high-resolution chest CT results. HRCT results from 01/16/2021 were reviewed with the patient today in the office: mildly progressive appearance of lungs compatible with ILD-probable UIP.  A referral to Dr. Chase Caller was placed yesterday.  She would not be a good candidate for methotrexate due to underlying ILD.  She presents today to discuss different other treatment options.  Indications, contraindications, and potential side effects of Imuran were discussed today in detail and all questions were addressed.  Consent was obtained.  We will obtain CBC and CMP today prior to sending in a prescription.  She will start on Imuran 50 mg 1 tablet by mouth daily x2 weeks and if labs are stable she will increase to 100 mg by mouth daily.    She was advised to notify us if she cannot tolerate taking Imuran. She will continue taking the prednisone taper as prescribed. She will follow-up with Dr. Estanislado Pandy in 1 month.    Baseline Immunosuppressant Therapy Labs TB GOLD Quantiferon TB Gold Latest Ref Rng & Units 01/13/2021  Quantiferon TB Gold Plus NEGATIVE NEGATIVE   Hepatitis Panel Hepatitis Latest Ref Rng & Units 01/13/2021  Hep B Surface Ag NON-REACTI NON-REACTIVE  Hep B IgM NON-REACTI NON-REACTIVE  Hep C Ab NON-REACTI NON-REACTIVE  Hep C Ab NON-REACTI NON-REACTIVE   HIV Lab Results  Component Value Date   HIV NON-REACTIVE 01/13/2021   Immunoglobulins Immunoglobulin Electrophoresis Latest Ref Rng &  Units 01/13/2021  IgA  70 - 320 mg/dL 156  IgG 600 - 1,540 mg/dL 1,246  IgM 50 - 300 mg/dL 92   SPEP Serum Protein Electrophoresis Latest Ref Rng & Units 01/13/2021  Total Protein 6.1 - 8.1 g/dL 7.2  Albumin 3.8 - 4.8 g/dL 3.7(L)  Alpha-1 0.2 - 0.3 g/dL 0.5(H)  Alpha-2 0.5 -  0.9 g/dL 1.1(H)  Beta Globulin 0.4 - 0.6 g/dL 0.4  Beta 2 0.2 - 0.5 g/dL 0.3  Gamma Globulin 0.8 - 1.7 g/dL 1.1   G6PD No results found for: G6PDH TPMT Lab Results  Component Value Date   TPMT 20 01/13/2021     Chest Xray: HR chest CT on 01/16/21  Patient was counseled on the purpose, proper use, and adverse effects of azathioprine including risk of infection, nausea, rash, and hair loss.  Also informed that medication can cause discoloration of urine, sweat and tears. Reviewed risk of cancer after long term use.  Discussed risk of myelosupression and reviewed importance of frequent lab work to monitor blood counts.  Standing orders placed. Reviewed drug-drug interactions including contraindication with allopurinol.  Provided patient with educational materials on azathioprine and answered all questions.  Patient consented to azathioprine.  Will upload consent into the media tab.   Patient dose will be 50 mg daily for 2 weeks and if labs are stable she will increase to 100 mg daily.  Prescription will be sent to pharmacy pending lab results and insurance approval.  High risk medication use: She will be starting on Imuran 50 mg 1 tablet by mouth daily x2 weeks and if labs are stable she will increase to 100 mg by mouth daily.  Prescription pending lab results. She will return for lab work in 2 weeks x2 then every 3 months.  Standing orders for CBC and CMP placed today.  We will obtain updated CBC and CMP today prior to sending the prescription to the pharmacy.   TPMT WNL on 01/13/21.  She has had all baseline immunosuppressive lab work. Discussed the importance of holding imuran if she develops signs or symptoms of an  infection and to resume once the infection has completely cleared.  She has had 2 pfizer covid-19 vaccines and 1 moderna covid-19 vaccine dose.   ILD (interstitial lung disease) [facility]): Referral to Dr. Chase Caller placed yesterday. High resolution chest CT performed on 01/16/21: The appearance of the lungs is compatible with interstitial lung disease which is mildly progressive when compared to remote prior study from 9 years ago. The spectrum of findings on today's examination is categorized as probable usual interstitial pneumonia (UIP) per current ATS guidelines. She will be starting on Imuran as discussed above.   Raynaud's disease without gangrene: Not currently active.    Primary osteoarthritis of both knees: Bilateral mild osteoarthritis and mild chondromalacia patella.  She has been experiencing increased pain in the right knee joint for the past several weeks.  No recent injuries or falls.  She has tender point from fibromyalgia on the medial aspect of both knees.  No warmth or effusion noted.  Discussed the use of topical agents for pain relief.  Also discussed trying to sleep with a pillow between her knees at night.   DDD (degenerative disc disease), cervical: H/o fusion.  Limited ROM with lateral rotation.   History of fusion of cervical spine (C5-6 ACDF): Limited ROM with lateral rotation.  Chronic pain.  She takes hydromorphone on a daily basis for pain relief.  Referral to pain management placed today.   DDD (degenerative disc disease), thoracic: Chronic pain.  midline spinal tenderness in the thoracic region noted.   DDD (degenerative disc disease), lumbar: Chronic pain. Referral to pain management will be placed today.   Fibromyalgia syndrome: She has generalized hyperalgesia and positive tender points on exam.  She is taking cymbalta as prescribed and flexeril 10 mg 1/2  to 1 tablet at bedtime for muscle spasms.   Chronic pain syndrome: She requested a referral to pain management  within the [facility].  She lives in [place] and would prefer to see a MD in [place] instead of driving to [employer] term current use of opiate analgesic: She is taking hydromophone HCI 12 mg daily.   Other medical conditions are listed as follows:   Primary hypertension  Mitral valve disorder  Hyperlipidemia associated with type 2 diabetes mellitus ([place])  Type 2 diabetes mellitus without complication, without long-term current use of insulin ([facility])  History of gastroesophageal reflux (GERD)  History of IBS  History of hypothyroidism  Depression with anxiety  Orders: Orders Placed This Encounter  Procedures  . CBC with Differential/Platelet  . COMPLETE METABOLIC PANEL WITH GFR  . CBC with Differential/Platelet  . COMPLETE METABOLIC PANEL WITH GFR   No orders of the defined types were placed in this encounter.    Follow-Up Instructions: Return in about 6 weeks (around 03/04/2021) for Rheumatoid arthritis, ILD.   Ofilia Neas, PA-C  Note - This record has been created using Dragon software.  Chart creation errors have been sought, but may not always  have been located. Such creation errors do not reflect on  the standard of medical care.

## 2021-01-20 NOTE — Progress Notes (Signed)
Call patient and discuss results with the patient.  I have referred her to pulmonary.  She was advised to see her PCP for the evaluation of coronary artery disease.

## 2021-01-20 NOTE — Progress Notes (Signed)
Please schedule an appointment with Tina Berger to discuss results of the CT scan and the labs.  Plan to start her on Imuran for interstitial lung disease.  The plan is to start her on Imuran 50 mg p.o. daily for 2 weeks and if tolerated increase to 200 mg p.o. daily.  Repeat labs in 2 weeks x 2.  I discussed with Dr. Chase Caller.  He will see the patient soon in the office.  Please send a referral to Dr. Chase Caller.  I called patient and discussed the above.

## 2021-01-21 ENCOUNTER — Other Ambulatory Visit: Payer: Self-pay

## 2021-01-21 ENCOUNTER — Ambulatory Visit: Payer: Medicare Other | Admitting: Physician Assistant

## 2021-01-21 ENCOUNTER — Encounter: Payer: Self-pay | Admitting: Physician Assistant

## 2021-01-21 ENCOUNTER — Ambulatory Visit: Payer: Medicare Other | Admitting: Rheumatology

## 2021-01-21 VITALS — BP 125/83 | HR 71 | Resp 16 | Ht 61.75 in | Wt 199.8 lb

## 2021-01-21 DIAGNOSIS — E1169 Type 2 diabetes mellitus with other specified complication: Secondary | ICD-10-CM

## 2021-01-21 DIAGNOSIS — M0609 Rheumatoid arthritis without rheumatoid factor, multiple sites: Secondary | ICD-10-CM | POA: Diagnosis not present

## 2021-01-21 DIAGNOSIS — M5136 Other intervertebral disc degeneration, lumbar region: Secondary | ICD-10-CM | POA: Diagnosis not present

## 2021-01-21 DIAGNOSIS — M5134 Other intervertebral disc degeneration, thoracic region: Secondary | ICD-10-CM

## 2021-01-21 DIAGNOSIS — F418 Other specified anxiety disorders: Secondary | ICD-10-CM

## 2021-01-21 DIAGNOSIS — Z8719 Personal history of other diseases of the digestive system: Secondary | ICD-10-CM

## 2021-01-21 DIAGNOSIS — M170 Bilateral primary osteoarthritis of knee: Secondary | ICD-10-CM

## 2021-01-21 DIAGNOSIS — Z79891 Long term (current) use of opiate analgesic: Secondary | ICD-10-CM

## 2021-01-21 DIAGNOSIS — J849 Interstitial pulmonary disease, unspecified: Secondary | ICD-10-CM | POA: Diagnosis not present

## 2021-01-21 DIAGNOSIS — E119 Type 2 diabetes mellitus without complications: Secondary | ICD-10-CM

## 2021-01-21 DIAGNOSIS — E785 Hyperlipidemia, unspecified: Secondary | ICD-10-CM

## 2021-01-21 DIAGNOSIS — M797 Fibromyalgia: Secondary | ICD-10-CM

## 2021-01-21 DIAGNOSIS — M5030 Other cervical disc degeneration, unspecified cervical region: Secondary | ICD-10-CM

## 2021-01-21 DIAGNOSIS — R768 Other specified abnormal immunological findings in serum: Secondary | ICD-10-CM

## 2021-01-21 DIAGNOSIS — G894 Chronic pain syndrome: Secondary | ICD-10-CM

## 2021-01-21 DIAGNOSIS — I059 Rheumatic mitral valve disease, unspecified: Secondary | ICD-10-CM

## 2021-01-21 DIAGNOSIS — I10 Essential (primary) hypertension: Secondary | ICD-10-CM

## 2021-01-21 DIAGNOSIS — I7300 Raynaud's syndrome without gangrene: Secondary | ICD-10-CM

## 2021-01-21 DIAGNOSIS — Z79899 Other long term (current) drug therapy: Secondary | ICD-10-CM | POA: Diagnosis not present

## 2021-01-21 DIAGNOSIS — M19041 Primary osteoarthritis, right hand: Secondary | ICD-10-CM

## 2021-01-21 DIAGNOSIS — Z981 Arthrodesis status: Secondary | ICD-10-CM

## 2021-01-21 DIAGNOSIS — Z8639 Personal history of other endocrine, nutritional and metabolic disease: Secondary | ICD-10-CM

## 2021-01-21 DIAGNOSIS — R700 Elevated erythrocyte sedimentation rate: Secondary | ICD-10-CM

## 2021-01-21 NOTE — Telephone Encounter (Signed)
Patient scheduled for pft an f/u with MR on 03/06/2021 -pr

## 2021-01-21 NOTE — Telephone Encounter (Signed)
Attempted to call pt but unable to reach. Left pt a detailed message and asked for her to return call.  We should be able to get both the PFT and Consult with MR on the same day in May 2022 having pt do the PFT prior to her seeing MR.  Once pt calls back to have appts scheduled, please let me know so I can place ILD Questionnaire packet in mail for her.  Routing this to front desk pool for further help on getting pt scheduled.

## 2021-01-21 NOTE — Addendum Note (Signed)
Addended by: Earnestine Mealing on: 01/21/2021 03:50 PM   Modules accepted: Orders

## 2021-01-21 NOTE — Telephone Encounter (Signed)
ILD Questionnaire Packet has been placed in the mail to pt. Nothing further needed.

## 2021-01-21 NOTE — Patient Instructions (Addendum)
Standing Labs We placed an order today for your standing lab work.   Please have your standing labs drawn in 2 weeks x2 then every 3 months    If possible, please have your labs drawn 2 weeks prior to your appointment so that the provider can discuss your results at your appointment.  We have open lab daily Monday through Thursday from 1:30-4:30 PM and Friday from 1:30-4:00 PM at the office of Dr. Bo Merino, [facility].   Please be advised, all patients with office appointments requiring lab work will take precedents over walk-in lab work.  If possible, please come for your lab work on Monday and Friday afternoons, as you may experience shorter wait times. The office is located at [address], [place], [place], [place] [address] No appointment is necessary.   Labs are drawn by [facility]. Please bring your co-pay at the time of your lab draw.  You may receive a bill from [facility] for your lab work.  If you wish to have your labs drawn at another location, please call the office 24 hours in advance to send orders.  If you have any questions regarding directions or hours of operation,  please call [phone].   As a reminder, please drink plenty of water prior to coming for your lab work. Thanks!    Azathioprine tablets What is this medicine? AZATHIOPRINE (ay za THYE oh preen) suppresses the immune system. It is used to prevent organ rejection after a transplant. It is also used to treat rheumatoid arthritis. This medicine may be used for other purposes; ask your health care provider or pharmacist if you have questions. COMMON BRAND NAME(S): Azasan, Imuran What should I tell my health care provider before I take this medicine? They need to know if you have any of these conditions:  infection  kidney disease  liver disease  an unusual or allergic reaction to azathioprine, other medicines, lactose, foods, dyes, or preservatives  pregnant or trying to get  pregnant  breast feeding How should I use this medicine? Take this medicine by mouth with a full glass of water. Follow the directions on the prescription label. Take your medicine at regular intervals. Do not take your medicine more often than directed. Continue to take your medicine even if you feel better. Do not stop taking except on your doctor's advice. Talk to your pediatrician regarding the use of this medicine in children. Special care may be needed. Overdosage: If you think you have taken too much of this medicine contact a poison control center or emergency room at once. NOTE: This medicine is only for you. Do not share this medicine with others. What if I miss a dose? If you miss a dose, take it as soon as you can. If it is almost time for your next dose, take only that dose. Do not take double or extra doses. What may interact with this medicine? Do not take this medicine with any of the following medications:  febuxostat  mercaptopurine This medicine may also interact with the following medications:  allopurinol  aminosalicylates like sulfasalazine, mesalamine, balsalazide, and olsalazine  leflunomide  medicines called ACE inhibitors like benazepril, captopril, enalapril, fosinopril, quinapril, lisinopril, ramipril, and trandolapril  mycophenolate  sulfamethoxazole; trimethoprim  vaccines  warfarin This list may not describe all possible interactions. Give your health care provider a list of all the medicines, herbs, non-prescription drugs, or dietary supplements you use. Also tell them if you smoke, drink alcohol, or use illegal drugs.  Some items may interact with your medicine. What should I watch for while using this medicine? Visit your doctor or health care professional for regular checks on your progress. You will need frequent blood checks during the first few months you are receiving the medicine. If you get a cold or other infection while receiving this  medicine, call your doctor or health care professional. Do not treat yourself. The medicine may increase your risk of getting an infection. Women should inform their doctor if they wish to become pregnant or think they might be pregnant. There is a potential for serious side effects to an unborn child. Talk to your health care professional or pharmacist for more information. Men may have a reduced sperm count while they are taking this medicine. Talk to your health care professional for more information. This medicine may increase your risk of getting certain kinds of cancer. Talk to your doctor about healthy lifestyle choices, important screenings, and your risk. What side effects may I notice from receiving this medicine? Side effects that you should report to your doctor or health care professional as soon as possible:  allergic reactions like skin rash, itching or hives, swelling of the face, lips, or tongue  changes in vision  confusion  fever, chills, or any other sign of infection  loss of balance or coordination  severe stomach pain  unusual bleeding, bruising  unusually weak or tired  vomiting  yellowing of the eyes or skin Side effects that usually do not require medical attention (report to your doctor or health care professional if they continue or are bothersome):  hair loss  nausea This list may not describe all possible side effects. Call your doctor for medical advice about side effects. You may report side effects to FDA at 1-800-FDA-1088. Where should I keep my medicine? Keep out of the reach of children. Store at room temperature between 15 and 25 degrees C (59 and 77 degrees F). Protect from light. Throw away any unused medicine after the expiration date. NOTE: This sheet is a summary. It may not cover all possible information. If you have questions about this medicine, talk to your doctor, pharmacist, or health care provider.  2021 Elsevier/Gold Standard  (2014-01-29 12:00:31)

## 2021-01-21 NOTE — Telephone Encounter (Signed)
Pending lab results, patient will be starting Imuran per Hazel Sams, PA-C.   Consent obtained and sent to the scan center. Thanks!

## 2021-01-22 ENCOUNTER — Other Ambulatory Visit: Payer: Self-pay | Admitting: Family Medicine

## 2021-01-22 ENCOUNTER — Telehealth: Payer: Self-pay | Admitting: Family Medicine

## 2021-01-22 DIAGNOSIS — I2510 Atherosclerotic heart disease of native coronary artery without angina pectoris: Secondary | ICD-10-CM

## 2021-01-22 LAB — COMPLETE METABOLIC PANEL WITH GFR
AG Ratio: 1.3 (calc) (ref 1.0–2.5)
ALT: 19 U/L (ref 6–29)
AST: 18 U/L (ref 10–35)
Albumin: 4.4 g/dL (ref 3.6–5.1)
Alkaline phosphatase (APISO): 74 U/L (ref 37–153)
BUN/Creatinine Ratio: 27 (calc) — ABNORMAL HIGH (ref 6–22)
BUN: 27 mg/dL — ABNORMAL HIGH (ref 7–25)
CO2: 31 mmol/L (ref 20–32)
Calcium: 9.4 mg/dL (ref 8.6–10.4)
Chloride: 97 mmol/L — ABNORMAL LOW (ref 98–110)
Creat: 1.01 mg/dL — ABNORMAL HIGH (ref 0.60–0.93)
GFR, Est African American: 65 mL/min/{1.73_m2} (ref >=60)
GFR, Est Non African American: 56 mL/min/{1.73_m2} — ABNORMAL LOW (ref >=60)
Globulin: 3.5 g/dL (calc) (ref 1.9–3.7)
Glucose, Bld: 114 mg/dL — ABNORMAL HIGH (ref 65–99)
Potassium: 4.9 mmol/L (ref 3.5–5.3)
Sodium: 139 mmol/L (ref 135–146)
Total Bilirubin: 0.4 mg/dL (ref 0.2–1.2)
Total Protein: 7.9 g/dL (ref 6.1–8.1)

## 2021-01-22 LAB — CBC WITH DIFFERENTIAL/PLATELET
Absolute Monocytes: 823 cells/uL (ref 200–950)
Basophils Absolute: 61 cells/uL (ref 0–200)
Basophils Relative: 0.5 %
Eosinophils Absolute: 145 cells/uL (ref 15–500)
Eosinophils Relative: 1.2 %
HCT: 43.6 % (ref 35.0–45.0)
Hemoglobin: 14 g/dL (ref 11.7–15.5)
Lymphs Abs: 2166 cells/uL (ref 850–3900)
MCH: 27.7 pg (ref 27.0–33.0)
MCHC: 32.1 g/dL (ref 32.0–36.0)
MCV: 86.3 fL (ref 80.0–100.0)
MPV: 9.3 fL (ref 7.5–12.5)
Monocytes Relative: 6.8 %
Neutro Abs: 8906 cells/uL — ABNORMAL HIGH (ref 1500–7800)
Neutrophils Relative %: 73.6 %
Platelets: 429 10*3/uL — ABNORMAL HIGH (ref 140–400)
RBC: 5.05 10*6/uL (ref 3.80–5.10)
RDW: 13.1 % (ref 11.0–15.0)
Total Lymphocyte: 17.9 %
WBC: 12.1 10*3/uL — ABNORMAL HIGH (ref 3.8–10.8)

## 2021-01-22 MED ORDER — AZATHIOPRINE 50 MG PO TABS: ORAL_TABLET | ORAL | 0 refills | Status: DC

## 2021-01-22 NOTE — Telephone Encounter (Signed)
I think she is back to one daily now but please verify with her, thanks

## 2021-01-22 NOTE — Progress Notes (Signed)
WBC count is slightly elevated. Absolute neutrophils are elevated.  Platelet count is slightly elevated likely due to inflammation.   Creatinine is borderline elevated-1.01 and GFR is slightly low-56. Please advise the patient to avoid NSAIDs.   Ok to send in Imuran 50 mg 1 tablet by mouth daily x2 weeks.  If labs are stable she will increase to 50 mg 2 tablets daily.   She will return for lab work in 2 weeks x2 then every 3 months.

## 2021-01-22 NOTE — Telephone Encounter (Signed)
Will route to PCP to verify dosing. Pt was given 2 tabs daily last Rx on 09/09/20 but it looks like note says this was temp, so ?? If pt should be back on once daily dosing again, please advise

## 2021-01-22 NOTE — Telephone Encounter (Signed)
Labs: 01/21/2021 WBC count is slightly elevated. Absolute neutrophils are elevated. Platelet count is slightly elevated likely due to inflammation.  Creatinine is borderline elevated-1.01 and GFR is slightly low-56. Please advise the patient to avoid NSAIDs.   Ok to send in Imuran 50 mg 1 tablet by mouth daily x2 weeks. If labs are stable she will increase to 50 mg 2 tablets daily.   She will return for lab work in 2 weeks x2 then every 3 months.    Patient requested lab orders be placed for [facility]. [facility]

## 2021-01-22 NOTE — Telephone Encounter (Signed)
-----   Message from Tina Berger, [place] sent at 01/22/2021  2:11 PM EDT ----- Pt advised of PCP's comments. Pt is okay with PCP putting a referral in for her to go back and see Dr. [name], I advise our [facility] will call to schedule appt

## 2021-01-23 ENCOUNTER — Telehealth: Payer: Self-pay | Admitting: Rheumatology

## 2021-01-23 NOTE — Telephone Encounter (Signed)
I called patient, patient refused to go to [facility], referral sent to [facility], pending appt

## 2021-01-23 NOTE — Telephone Encounter (Signed)
Calling to let you know referral was declined due to the fact they do not prescribe at that level. Recommend possible another Pain Clinic. They did update referral with this information.

## 2021-01-23 NOTE — Telephone Encounter (Deleted)
I called patient, referral faxed to [facility]

## 2021-02-03 ENCOUNTER — Other Ambulatory Visit: Payer: Self-pay

## 2021-02-03 MED ORDER — PREDNISONE 5 MG PO TABS: ORAL_TABLET | ORAL | 0 refills | Status: DC

## 2021-02-03 NOTE — Telephone Encounter (Signed)
Next Visit: 02/20/2021  Last Visit: 01/21/2021  Last Fill: 01/13/2021  Dx:  Rheumatoid arthritis of multiple sites with negative rheumatoid factor  Current Dose per office note on 01/21/2021, not mentioned  Okay to refill prednisone?   Patient is feeling worse as tapering down. Patient is on the .5 tab taper now.

## 2021-02-03 NOTE — Telephone Encounter (Signed)
Patient called requesting prescription refill of Prednsione.  Patient states at her last appointment Lovena Le told her if she was still having problems with her hands she would send in another prescription for Prednisone.  Patient requested it be sent to [facility] in [place].    Patient states she also noticed while reading the paperwork on the medication Imuran she read that the medication that she takes Ramiprill is listed as one of the medications that might interact with it.  Patient requested a return call.

## 2021-02-03 NOTE — Telephone Encounter (Signed)
I called patient, patient verbalized understanding. 

## 2021-02-03 NOTE — Telephone Encounter (Signed)
Tina Berger, Patient states she also noticed while reading the paperwork on the medication Imuran she read that the medication that she takes Ramiprill is listed as one of the medications that might interact with it.  Patient requested a return call.

## 2021-02-03 NOTE — Telephone Encounter (Signed)
The medication interaction between ACEi and imuran is very rare. We will be monitoring her lab work closely and she has been advised to hold imuran anytime she has an infection.     Ok to refill prednisone taper.

## 2021-02-05 ENCOUNTER — Telehealth: Payer: Self-pay

## 2021-02-05 DIAGNOSIS — Z79899 Other long term (current) drug therapy: Secondary | ICD-10-CM

## 2021-02-05 NOTE — Telephone Encounter (Signed)
Lab orders released for [facility]. I called patient and advised.

## 2021-02-05 NOTE — Telephone Encounter (Signed)
Patient called requesting her labwork orders be sent to [facility] in [place].  Patient states she has an appointment tomorrow 02/07/20 at 11:00 am.

## 2021-02-06 DIAGNOSIS — Z79899 Other long term (current) drug therapy: Secondary | ICD-10-CM | POA: Diagnosis not present

## 2021-02-07 LAB — CMP14+EGFR
ALT: 19 IU/L (ref 0–32)
AST: 18 IU/L (ref 0–40)
Albumin/Globulin Ratio: 1.6 (ref 1.2–2.2)
Albumin: 4.6 g/dL (ref 3.7–4.7)
Alkaline Phosphatase: 81 IU/L (ref 44–121)
BUN/Creatinine Ratio: 20 (ref 12–28)
BUN: 17 mg/dL (ref 8–27)
Bilirubin Total: <0.2 mg/dL (ref 0.0–1.2)
CO2: 27 mmol/L (ref 20–29)
Calcium: 9.3 mg/dL (ref 8.7–10.3)
Chloride: 98 mmol/L (ref 96–106)
Creatinine, Ser: 0.85 mg/dL (ref 0.57–1.00)
Globulin, Total: 2.8 g/dL (ref 1.5–4.5)
Glucose: 139 mg/dL — ABNORMAL HIGH (ref 65–99)
Potassium: 5.2 mmol/L (ref 3.5–5.2)
Sodium: 141 mmol/L (ref 134–144)
Total Protein: 7.4 g/dL (ref 6.0–8.5)
eGFR: 73 mL/min/{1.73_m2} (ref >59)

## 2021-02-07 LAB — CBC WITH DIFFERENTIAL/PLATELET
Basophils Absolute: 0.1 10*3/uL (ref 0.0–0.2)
Basos: 1 %
EOS (ABSOLUTE): 0.2 10*3/uL (ref 0.0–0.4)
Eos: 2 %
Hematocrit: 39 % (ref 34.0–46.6)
Hemoglobin: 12.7 g/dL (ref 11.1–15.9)
Immature Grans (Abs): 0.1 10*3/uL (ref 0.0–0.1)
Immature Granulocytes: 1 %
Lymphocytes Absolute: 1.7 10*3/uL (ref 0.7–3.1)
Lymphs: 16 %
MCH: 28.2 pg (ref 26.6–33.0)
MCHC: 32.6 g/dL (ref 31.5–35.7)
MCV: 87 fL (ref 79–97)
Monocytes Absolute: 0.7 10*3/uL (ref 0.1–0.9)
Monocytes: 6 %
Neutrophils Absolute: 7.9 10*3/uL — ABNORMAL HIGH (ref 1.4–7.0)
Neutrophils: 74 %
Platelets: 327 10*3/uL (ref 150–450)
RBC: 4.5 x10E6/uL (ref 3.77–5.28)
RDW: 13.1 % (ref 11.7–15.4)
WBC: 10.6 10*3/uL (ref 3.4–10.8)

## 2021-02-07 NOTE — Telephone Encounter (Signed)
Glucose is 139. Rest of CMP WNL.  Absolute neutrophils are slightly elevated but trending down. WBC count is WNL.  Rest of CBC WNL

## 2021-02-09 ENCOUNTER — Ambulatory Visit: Payer: Medicare Other | Admitting: Cardiology

## 2021-02-09 ENCOUNTER — Other Ambulatory Visit: Payer: Self-pay | Admitting: *Deleted

## 2021-02-09 MED ORDER — AZATHIOPRINE 50 MG PO TABS: ORAL_TABLET | ORAL | 0 refills | Status: DC

## 2021-02-12 NOTE — Progress Notes (Signed)
Office Visit Note  Patient: Tina Berger             Date of Birth: [birth year]           MRN: [id number]             PCP: Abner Greenspan, MD Referring: Tower, [name], MD Visit Date: 02/20/2021 Occupation: @GUAROCC @  Subjective:  Increased fatigue   History of Present Illness: Tina Berger is a 70 y.o. female history of rheumatoid arthritis and ILD.  She was a started on Imuran about a month ago.  She has increased the dose to 100 mg p.o.daily.  She has noticed increased fatigue.  She states she is uncertain if the fatigue is related to the Imuran.  She is on prednisone taper and currently taking 5 mg p.o. daily.  She also had recent constipation and some headaches.  She has not noticed any worsening of her shortness of breath.  She continues to have some stiffness in her hands and feet especially in the morning.  She has not noticed any joint swelling.  History of discomfort in her neck upper back and lower back.  Activities of Daily Living:  Patient reports morning stiffness for all day. Patient Reports nocturnal pain.  Difficulty dressing/grooming: Reports Difficulty climbing stairs: Denies Difficulty getting out of chair: Denies Difficulty using hands for taps, buttons, cutlery, and/or writing: Reports  Review of Systems  Constitutional: Positive for fatigue. Negative for night sweats, weight gain and weight loss.  HENT: Positive for mouth dryness and nose dryness. Negative for mouth sores, trouble swallowing and trouble swallowing.   Eyes: Positive for dryness. Negative for pain, redness, itching and visual disturbance.  Respiratory: Negative for cough, shortness of breath and difficulty breathing.   Cardiovascular: Negative for chest pain, palpitations, hypertension, irregular heartbeat and swelling in legs/feet.  Gastrointestinal: Positive for constipation. Negative for blood in stool and diarrhea.  Endocrine: Negative for increased urination.  Genitourinary: Negative for  difficulty urinating and vaginal dryness.  Musculoskeletal: Positive for arthralgias, joint pain, myalgias, morning stiffness, muscle tenderness and myalgias. Negative for joint swelling and muscle weakness.  Skin: Negative for color change, rash, hair loss, redness, skin tightness, ulcers and sensitivity to sunlight.  Allergic/Immunologic: Negative for susceptible to infections.  Neurological: Positive for headaches. Negative for dizziness, numbness, memory loss, night sweats and weakness.  Hematological: Negative for bruising/bleeding tendency and swollen glands.  Psychiatric/Behavioral: Positive for sleep disturbance. Negative for depressed mood and confusion. The patient is not nervous/anxious.     PMFS History:  Patient Active Problem List   Diagnosis Date Noted  . CAD (coronary artery disease) 01/22/2021  . Primary osteoarthritis of both knees 01/12/2021  . Positive ANA (antinuclear antibody) 11/17/2020  . Joint pain 11/13/2020  . Elevated serum creatinine 08/26/2020  . Current use of proton pump inhibitor 08/26/2020  . Estrogen deficiency 08/21/2018  . TMJ (dislocation of temporomandibular joint), initial encounter 05/05/2018  . Anterior neck pain 05/05/2018  . Chronic pain syndrome 11/09/2017  . Chronic upper extremity pain [facility] Area of Pain) (Bilateral) (L>R) 11/09/2017  . Fibromyalgia syndrome 11/09/2017  . Osteoarthritis 11/09/2017  . Osteoarthritis of lumbar facet joint (Bilateral) 11/09/2017  . Lumbar facet arthropathy (Bilateral) 11/09/2017  . Lumbar facet syndrome (Bilateral) (L>R) 11/09/2017  . Lumbar foraminal stenosis (multilevel) (Bilateral) 11/09/2017  . DDD (degenerative disc disease), lumbar 11/09/2017  . Thoracic levoscoliosis 11/09/2017  . Thoracic facet syndrome (Bilateral) (L>R) 11/09/2017  . Thoracic facet arthropathy (Bilateral) (R>L) 11/09/2017  .  DDD (degenerative disc disease), thoracic 11/09/2017  . Thoracolumbar IVDD 11/09/2017  . DDD  (degenerative disc disease), cervical 11/09/2017  . Osteoarthritis of cervical facet (Bilateral) 11/09/2017  . Grade 1 Anterolisthesis of C7 over T1 11/09/2017  . Cervical foraminal stenosis (Bilateral) 11/09/2017  . History of fusion of cervical spine (C5-6 ACDF) 11/09/2017  . Cervical facet syndrome (Bilateral) 11/09/2017  . Disorder of skeletal system 11/09/2017  . Cervical radiculitis (Bilateral) 11/09/2017  . Lumbar Epidural lipomatosis 11/09/2017  . Chronic sacroiliac joint pain (Bilateral) (L>R) 11/09/2017  . Chronic hip pain (Bilateral) (L>R) 11/09/2017  . Chronic upper back pain (Primary Area of Pain) (midline) 10/24/2017  . Chronic neck pain (Secondary Area of Pain) (Bilateral)  (L>R) 10/24/2017  . Chronic low back pain (Fourth Area of Pain) (Bilateral) (L>R) 10/24/2017  . Chronic lower extremity pain (Fifth Area of Pain) (Bilateral) (L>R) 10/24/2017  . Lumbar Grade 1 Retrolisthesis of L1-2 and L2-3 10/24/2017  . Other long term (current) drug therapy 10/24/2017  . Other specified health status 10/24/2017  . Long term current use of opiate analgesic 10/24/2017  . Long term prescription opiate use 10/24/2017  . Opiate use 10/24/2017  . DM type 2 (diabetes mellitus, type 2) ([place]) 06/14/2014  . Pharmacologic therapy 06/14/2014  . Pedal edema   . Post-menopausal 06/23/2012  . Lumbar disc disease with radiculopathy 11/18/2011  . HTN (hypertension) 07/30/2011  . Raynaud disease 07/30/2011  . Obesity 07/30/2011  . Routine general medical examination at a health care facility 03/28/2011  . Palpitations 04/27/2010  . Problems influencing health status 12/11/2009  . Depression with anxiety 06/14/2008  . Vitamin D deficiency 05/13/2008  . ANXIETY 03/25/2008  . Osteopenia 03/25/2008  . Hypothyroidism 03/28/2007  . Hyperlipidemia associated with type 2 diabetes mellitus ([place]) 03/28/2007  . Other iron deficiency anemias 03/28/2007  . PANIC ATTACK 03/28/2007  . KERATOCONJUNCTIVITIS  SICCA 03/28/2007  . Mitral valve disorder 03/28/2007  . ABNORMAL HEART RHYTHMS 03/28/2007  . Raynaud's syndrome 03/28/2007  . GERD 03/28/2007  . IBS 03/28/2007  . Rosacea 03/28/2007  . PLANTAR FASCIITIS 03/28/2007  . MIGRAINES, HX OF 03/28/2007    Past Medical History:  Diagnosis Date  . Acute renal insufficiency 05/31/2014  . Anemia, iron deficiency   . Bilateral lower extremity edema    a. uses torsemide  . Cervical dysplasia    abnormal paps  . Colon cancer screening 06/14/2014  . Cough 08/08/2012  . Degenerative disk disease 11/19/2011  . Diabetes mellitus type II    Diet controlled  . Diverticulosis   . DJD (degenerative joint disease)   . Drug rash 05/22/2011  . Dry eyes   . Edema   . Elevated liver enzymes 03/21/2012  . Encounter for routine gynecological examination 06/14/2014  . ESOPHAGITIS 03/28/2007   Qualifier: Hospitalized for  By: Marcelino Scot CMA, [place] Bilberry    . Fibromyalgia   . GASTRITIS 03/28/2007   Qualifier: History of  By: Marcelino Scot CMA, [place] Bilberry    . GERD (gastroesophageal reflux disease)   . Hemorrhoids    external  . HLD (hyperlipidemia)   . HNP (herniated nucleus pulposus) 2/99   T6,7,8 with DJD  . HTN (hypertension)   . Hyperglycemia 05/13/2008   Qualifier: Diagnosis of  By: Glori Bickers MD, Carmell Austria   . Hypothyroidism   . Interstitial lung disease ([place])    ?  Marland Kitchen Left ovarian cyst    x 3, rupture  . Osteoarthritis    hands  . Osteopenia    mild-11/01;  improved 12/05  . Other screening mammogram 08/18/2011  . Palpitations   . PERSONAL HISTORY ALLERGY UNSPEC MEDICINAL AGENT 03/18/2010   Qualifier: Diagnosis of  By: Glori Bickers MD, Carmell Austria   . Raynaud's disease   . Recurrent HSV (herpes simplex virus)    lesions in nose  . Rhinitis 03/21/2012  . Routine general medical examination at a health care facility 03/28/2011    Family History  Problem Relation Age of Onset  . Emphysema Father        + smoker  . Lung cancer Mother        + smoker  . Coronary  artery disease Mother        relatively young  . Lymphoma Sister   . Heart disease Brother   . Lymphoma Brother   . Parkinson's disease Brother   . Anemia Brother        aplastic   . Diabetes Brother   . Leukemia Son   . Iron deficiency Daughter   . Breast cancer Neg Hx    Past Surgical History:  Procedure Laterality Date  . ABDOMINAL EXPLORATION SURGERY    . COLONOSCOPY  11/01   Diverticulosis; hemorrhoids  . HAND SURGERY     left thumb  . LASIK     bilateral   Social History   Social History Narrative   Married      1 child      Network engineer to Scientist, physiological at [employer]      Does regularly exercise      Daily caffeine use: 2/day         Immunization History  Administered Date(s) Administered  . Fluad Quad(high Dose 65+) 08/07/2020  . Influenza Split 06/19/2011  . Influenza Whole 08/01/2009  . Influenza, High Dose Seasonal PF 08/14/2018, 07/03/2019  . Influenza,inj,Quad PF,6+ Mos 06/14/2014, 08/25/2015, 07/26/2016, 08/19/2017  . Moderna Sars-Covid-2 Vaccination 09/17/2020  . PFIZER(Purple Top)SARS-COV-2 Vaccination 11/27/2019, 12/18/2019  . Pneumococcal Conjugate-13 08/25/2015  . Pneumococcal Polysaccharide-23 08/25/2016  . Td 11/25/2003  . Tdap 06/14/2014  . Zoster 09/16/2014     Objective: Vital Signs: BP 113/74 (BP Location: Left Arm, Patient Position: Sitting, Cuff Size: Normal)   Pulse 83   Resp 15   Ht 5\' 2"  (1.575 m)   Wt 201 lb 6.4 oz (91.4 kg)   BMI 36.84 kg/m    Physical Exam Vitals and nursing note reviewed.  Constitutional:      Appearance: She is well-developed.  HENT:     Head: Normocephalic and atraumatic.  Eyes:     Conjunctiva/sclera: Conjunctivae normal.  Cardiovascular:     Rate and Rhythm: Normal rate and regular rhythm.     Heart sounds: Normal heart sounds.  Pulmonary:     Effort: Pulmonary effort is normal.     Breath sounds: Normal breath sounds.  Abdominal:     General: Bowel sounds are normal.     Palpations: Abdomen is soft.   Musculoskeletal:     Cervical back: Normal range of motion.  Lymphadenopathy:     Cervical: No cervical adenopathy.  Skin:    General: Skin is warm and dry.     Capillary Refill: Capillary refill takes less than 2 seconds.  Neurological:     Mental Status: She is alert and oriented to person, place, and time.  Psychiatric:        Behavior: Behavior normal.      Musculoskeletal Exam: She has some discomfort and stiffness range of motion of her cervical spine.  Shoulder joints and elbow joints were in good range of motion.  She had no synovitis of her wrist joints, or MCPs.  She has some PIP and DIP thickening consistent with osteoarthritis.  Hip joints, knee joints with good range of motion.  She had no tenderness over ankles or MTPs.  CDAI Exam: CDAI Score: 0.4  Patient Global: 2 mm; Provider Global: 2 mm Swollen: 0 ; Tender: 0  Joint Exam 02/20/2021   No joint exam has been documented for this visit   There is currently no information documented on the homunculus. Go to the Rheumatology activity and complete the homunculus joint exam.  Investigation: No additional findings.  Imaging: No results found.  Recent Labs: Lab Results  Component Value Date   WBC 10.6 02/06/2021   HGB 12.7 02/06/2021   PLT 327 02/06/2021   NA 141 02/06/2021   K 5.2 02/06/2021   CL 98 02/06/2021   CO2 27 02/06/2021   GLUCOSE 139 (H) 02/06/2021   BUN 17 02/06/2021   CREATININE 0.85 02/06/2021   BILITOT <0.2 02/06/2021   ALKPHOS 81 02/06/2021   AST 18 02/06/2021   ALT 19 02/06/2021   PROT 7.4 02/06/2021   ALBUMIN 4.6 02/06/2021   CALCIUM 9.3 02/06/2021   GFRAA 65 01/21/2021   QFTBGOLDPLUS NEGATIVE 01/13/2021    Speciality Comments: No specialty comments available.  Procedures:  No procedures performed Allergies: Keflex [cephalexin], Penicillins, Amitriptyline hcl, Atorvastatin, Benicar [olmesartan medoxomil], Ceftin [cefuroxime], Cetirizine hcl, Ciprofloxacin, Clonidine derivatives,  Diovan [valsartan], Erythromycin, Etodolac, Fluoxetine hcl, Furosemide, Gabapentin, Naproxen sodium, Paroxetine, Pregabalin, Sulfonamide derivatives, Tetracycline, Venlafaxine, and Cephalexin   Assessment / Plan:     Visit Diagnoses: Rheumatoid arthritis of multiple sites with negative rheumatoid factor (HCC) -  +CCP Ab, +CarP Ab, +ANA, inflammatory arthritis: Patient had no synovitis on my examination today.  She had been on a prednisone taper and she started Imuran about 4 weeks ago.  She is currently on Imuran 100 mg p.o. daily.  She states she has been experiencing some fatigue but she is not certain if is related to Imuran.  She had no synovitis on examination.  She is currently on prednisone 5 mg p.o. daily.  She will taper it to 2.5 mg p.o. daily and then will discontinue prednisone.  We will check labs today and then in 3 months to monitor for drug toxicity.  High risk medication use - Imuran 100mg  by mouth daily.  - Plan: CBC with Differential/Platelet, COMPLETE METABOLIC PANEL WITH GFR today and then every 3 months.  ILD (interstitial lung disease) ([place]) - High resolution chest CT performed on 01/16/21: The appearance of the lungs is compatible with interstitial lung disease which is mildly progressive when compared.  She has an appointment coming up with Dr. Chase Caller.  I would take his recommendations if we have to increase the dose of Imuran based on the ILD symptoms.  Raynaud's disease without gangrene-currently not active.  Primary osteoarthritis of both knees - Bilateral mild osteoarthritis and mild chondromalacia patella.   DDD (degenerative disc disease), cervical - H/o fusion.  She has limited range of motion but no discomfort.  History of fusion of cervical spine (C5-6 ACDF)  DDD (degenerative disc disease), thoracic-chronic pain  DDD (degenerative disc disease), lumbar-chronic pain  Fibromyalgia syndrome-she continues to have generalized pain and discomfort.  Chronic pain  syndrome  Long term current use of opiate analgesic - hydromophone HCI 12 mg daily.   Hyperlipidemia associated with type 2 diabetes mellitus (HCC)-I have advised  her to monitor blood sugars closely.  Increased risk of heart disease with rheumatoid arthritis was also discussed.  The instructions placed in the AVS.  Mitral valve disorder  Primary hypertension-her blood pressure is well controlled.  History of IBS  History of gastroesophageal reflux (GERD)  Type 2 diabetes mellitus without complication, without long-term current use of insulin ([place])  History of hypothyroidism  Depression with anxiety  Orders: Orders Placed This Encounter  Procedures  . CBC with Differential/Platelet  . COMPLETE METABOLIC PANEL WITH GFR   No orders of the defined types were placed in this encounter.    Follow-Up Instructions: Return in about 3 months (around 05/23/2021) for Rheumatoid arthritis, ILD.   Bo Merino, MD  Note - This record has been created using Editor, commissioning.  Chart creation errors have been sought, but may not always  have been located. Such creation errors do not reflect on  the standard of medical care.

## 2021-02-20 ENCOUNTER — Ambulatory Visit: Payer: Medicare Other | Admitting: Rheumatology

## 2021-02-20 ENCOUNTER — Other Ambulatory Visit: Payer: Self-pay

## 2021-02-20 ENCOUNTER — Encounter: Payer: Self-pay | Admitting: Rheumatology

## 2021-02-20 VITALS — BP 113/74 | HR 83 | Resp 15 | Ht 62.0 in | Wt 201.4 lb

## 2021-02-20 DIAGNOSIS — Z79899 Other long term (current) drug therapy: Secondary | ICD-10-CM

## 2021-02-20 DIAGNOSIS — I7300 Raynaud's syndrome without gangrene: Secondary | ICD-10-CM

## 2021-02-20 DIAGNOSIS — J849 Interstitial pulmonary disease, unspecified: Secondary | ICD-10-CM | POA: Diagnosis not present

## 2021-02-20 DIAGNOSIS — E119 Type 2 diabetes mellitus without complications: Secondary | ICD-10-CM

## 2021-02-20 DIAGNOSIS — M5030 Other cervical disc degeneration, unspecified cervical region: Secondary | ICD-10-CM

## 2021-02-20 DIAGNOSIS — M170 Bilateral primary osteoarthritis of knee: Secondary | ICD-10-CM | POA: Diagnosis not present

## 2021-02-20 DIAGNOSIS — Z79891 Long term (current) use of opiate analgesic: Secondary | ICD-10-CM | POA: Diagnosis not present

## 2021-02-20 DIAGNOSIS — M0609 Rheumatoid arthritis without rheumatoid factor, multiple sites: Secondary | ICD-10-CM

## 2021-02-20 DIAGNOSIS — M5134 Other intervertebral disc degeneration, thoracic region: Secondary | ICD-10-CM | POA: Diagnosis not present

## 2021-02-20 DIAGNOSIS — E785 Hyperlipidemia, unspecified: Secondary | ICD-10-CM

## 2021-02-20 DIAGNOSIS — M797 Fibromyalgia: Secondary | ICD-10-CM | POA: Diagnosis not present

## 2021-02-20 DIAGNOSIS — Z8639 Personal history of other endocrine, nutritional and metabolic disease: Secondary | ICD-10-CM

## 2021-02-20 DIAGNOSIS — M5136 Other intervertebral disc degeneration, lumbar region: Secondary | ICD-10-CM

## 2021-02-20 DIAGNOSIS — I059 Rheumatic mitral valve disease, unspecified: Secondary | ICD-10-CM

## 2021-02-20 DIAGNOSIS — G894 Chronic pain syndrome: Secondary | ICD-10-CM

## 2021-02-20 DIAGNOSIS — I10 Essential (primary) hypertension: Secondary | ICD-10-CM

## 2021-02-20 DIAGNOSIS — M51369 Other intervertebral disc degeneration, lumbar region without mention of lumbar back pain or lower extremity pain: Secondary | ICD-10-CM

## 2021-02-20 DIAGNOSIS — Z8719 Personal history of other diseases of the digestive system: Secondary | ICD-10-CM

## 2021-02-20 DIAGNOSIS — E1169 Type 2 diabetes mellitus with other specified complication: Secondary | ICD-10-CM

## 2021-02-20 DIAGNOSIS — Z981 Arthrodesis status: Secondary | ICD-10-CM

## 2021-02-20 DIAGNOSIS — F418 Other specified anxiety disorders: Secondary | ICD-10-CM

## 2021-02-20 NOTE — Patient Instructions (Signed)
Standing Labs We placed an order today for your standing lab work.   Please have your standing labs drawn in the months and then every 3 months.  If possible, please have your labs drawn 2 weeks prior to your appointment so that the provider can discuss your results at your appointment.  We have open lab daily Monday through Thursday from 1:30-4:30 PM and Friday from 1:30-4:00 PM at the office of Dr. Bo Merino, [facility].   Please be advised, all patients with office appointments requiring lab work will take precedents over walk-in lab work.  If possible, please come for your lab work on Monday and Friday afternoons, as you may experience shorter wait times. The office is located at [address], [place], [place], [place] [address] No appointment is necessary.   Labs are drawn by [facility]. Please bring your co-pay at the time of your lab draw.  You may receive a bill from [facility] for your lab work.  If you wish to have your labs drawn at another location, please call the office 24 hours in advance to send orders.  If you have any questions regarding directions or hours of operation,  please call [phone].   Heart Disease Prevention   Your inflammatory disease increases your risk of heart disease which includes heart attack, stroke, atrial fibrillation (irregular heartbeats), high blood pressure, heart failure and atherosclerosis (plaque in the arteries).  It is important to reduce your risk by:   . Keep blood pressure, cholesterol, and blood sugar at healthy levels   . Smoking Cessation   . Maintain a healthy weight  o BMI 20-25   . Eat a healthy diet  o Plenty of fresh fruit, vegetables, and whole grains  o Limit saturated fats, foods high in sodium, and added sugars  o DASH and Mediterranean diet   . Increase physical activity  o Recommend moderate physically activity for 150 minutes per week/ 30 minutes a day for five days a week These can be broken up  into three separate ten-minute sessions during the day.   . Reduce Stress  . Meditation, slow breathing exercises, yoga, coloring books  . Dental visits twice a year        As a reminder, please drink plenty of water prior to coming for your lab work. Thanks!

## 2021-02-21 LAB — COMPLETE METABOLIC PANEL WITH GFR
AG Ratio: 1.4 (calc) (ref 1.0–2.5)
ALT: 37 U/L — ABNORMAL HIGH (ref 6–29)
AST: 27 U/L (ref 10–35)
Albumin: 4.3 g/dL (ref 3.6–5.1)
Alkaline phosphatase (APISO): 97 U/L (ref 37–153)
BUN/Creatinine Ratio: 25 (calc) — ABNORMAL HIGH (ref 6–22)
BUN: 29 mg/dL — ABNORMAL HIGH (ref 7–25)
CO2: 32 mmol/L (ref 20–32)
Calcium: 9.6 mg/dL (ref 8.6–10.4)
Chloride: 94 mmol/L — ABNORMAL LOW (ref 98–110)
Creat: 1.15 mg/dL — ABNORMAL HIGH (ref 0.60–0.93)
GFR, Est African American: 55 mL/min/{1.73_m2} — ABNORMAL LOW (ref >=60)
GFR, Est Non African American: 48 mL/min/{1.73_m2} — ABNORMAL LOW (ref >=60)
Globulin: 3 g/dL (calc) (ref 1.9–3.7)
Glucose, Bld: 157 mg/dL — ABNORMAL HIGH (ref 65–99)
Potassium: 4.6 mmol/L (ref 3.5–5.3)
Sodium: 137 mmol/L (ref 135–146)
Total Bilirubin: 0.4 mg/dL (ref 0.2–1.2)
Total Protein: 7.3 g/dL (ref 6.1–8.1)

## 2021-02-21 LAB — CBC WITH DIFFERENTIAL/PLATELET
Absolute Monocytes: 720 cells/uL (ref 200–950)
Basophils Absolute: 58 cells/uL (ref 0–200)
Basophils Relative: 0.6 %
Eosinophils Absolute: 221 cells/uL (ref 15–500)
Eosinophils Relative: 2.3 %
HCT: 40.4 % (ref 35.0–45.0)
Hemoglobin: 13.4 g/dL (ref 11.7–15.5)
Lymphs Abs: 1478 cells/uL (ref 850–3900)
MCH: 29.2 pg (ref 27.0–33.0)
MCHC: 33.2 g/dL (ref 32.0–36.0)
MCV: 88 fL (ref 80.0–100.0)
MPV: 9.6 fL (ref 7.5–12.5)
Monocytes Relative: 7.5 %
Neutro Abs: 7123 cells/uL (ref 1500–7800)
Neutrophils Relative %: 74.2 %
Platelets: 390 10*3/uL (ref 140–400)
RBC: 4.59 10*6/uL (ref 3.80–5.10)
RDW: 13.6 % (ref 11.0–15.0)
Total Lymphocyte: 15.4 %
WBC: 9.6 10*3/uL (ref 3.8–10.8)

## 2021-02-21 NOTE — Progress Notes (Signed)
CBC is normal.  Glucose is elevated due to prednisone use.  Creatinine is mildly elevated.  Liver functions are mildly elevated.  Please advised to increase fluid intake and avoid all NSAIDs including topical diclofenac.  Please recheck CMP with GFR in 1 month.

## 2021-02-23 ENCOUNTER — Other Ambulatory Visit: Payer: Self-pay | Admitting: *Deleted

## 2021-02-23 DIAGNOSIS — Z79899 Other long term (current) drug therapy: Secondary | ICD-10-CM

## 2021-02-23 DIAGNOSIS — M0609 Rheumatoid arthritis without rheumatoid factor, multiple sites: Secondary | ICD-10-CM

## 2021-02-23 MED ORDER — AZATHIOPRINE 50 MG PO TABS: ORAL_TABLET | ORAL | 0 refills | Status: DC

## 2021-02-24 NOTE — Progress Notes (Deleted)
Cardiology Office Note  Date:  02/24/2021   ID:  Tina [place], DOB [birth year], MRN [id number]  PCP:  Abner Greenspan, MD   No chief complaint on file.   HPI:  Tina Berger is a 70 year old woman with a history of  Raynaud's disease, takes diltiazem in the winter hyperlipidemia, LDL 149 HTN,  obesity, Will chronic lower extremity swelling   DM, HBA1C 6.3 CT scan chest 2013 with no plaque, no CAD who presents for followup of her palpitations and hypertension, edema.  In follow-up today she continues to have problems with her raynauds Symptoms have been stable Symptoms better when standing also sitting on the edge of the seat Minimal symptoms in her hands, which Selena Batten of symptoms in her toes  No regular exercise program Continues to take Metoprolol twice a day Does not take diltiazem on a regular basis  Taking diuretic daily Torsemide one a day in the summer  Goes to [facility]  fibromyalgia, takes pain medication  CT scan chest reviewed from 2013 showing no significant aortic atherosclerosis or coronary calcification Images pulled up in the office  EKG personally reviewed by myself on todays visit shows normal sinus rhythm with rate 83 bpm, no significant ST or T-wave changes  Other past medical history She continues to drink a significant amount of fluid during the daytime She reports that on the metoprolol, her palpitations have been well-controlled.     Echocardiogram in October 2008 was essentially normal.   Stress test in October 2008 showed no ischemia. This is a exercise treadmill. She exercised for 4 minutes 30 seconds, peak heart rate of 152 beats per minute, peak blood pressure 172/94.      PMH:   has a past medical history of Acute renal insufficiency (05/31/2014), Anemia, iron deficiency, Bilateral lower extremity edema, Cervical dysplasia, Colon cancer screening (06/14/2014), Cough (08/08/2012), Degenerative disk disease (11/19/2011), Diabetes  mellitus type II, Diverticulosis, DJD (degenerative joint disease), Drug rash (05/22/2011), Dry eyes, Edema, Elevated liver enzymes (03/21/2012), Encounter for routine gynecological examination (06/14/2014), ESOPHAGITIS (03/28/2007), Fibromyalgia, GASTRITIS (03/28/2007), GERD (gastroesophageal reflux disease), Hemorrhoids, HLD (hyperlipidemia), HNP (herniated nucleus pulposus) (2/99), HTN (hypertension), Hyperglycemia (05/13/2008), Hypothyroidism, Interstitial lung disease ([place]), Left ovarian cyst, Osteoarthritis, Osteopenia, Other screening mammogram (08/18/2011), Palpitations, 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  PSH:    Past Surgical History:  Procedure Laterality Date  . ABDOMINAL EXPLORATION SURGERY    . COLONOSCOPY  11/01   Diverticulosis; hemorrhoids  . HAND SURGERY     left thumb  . LASIK     bilateral    Current Outpatient Medications  Medication Sig Dispense Refill  . Acetaminophen (TYLENOL ARTHRITIS PAIN PO) Take 650 mg by mouth daily.    Marland Kitchen azaTHIOprine (IMURAN) 50 MG tablet Take 2 tablets by mouth daily. 120 tablet 0  . buPROPion (WELLBUTRIN XL) 150 MG 24 hr tablet Take 1 tablet (150 mg total) by mouth daily. 90 tablet 3  . Chlorphen-Phenyleph-ASA (ALKA-SELTZER PLUS COLD PO) Take by mouth as needed.    . cyclobenzaprine (FLEXERIL) 10 MG tablet TAKE 1/2 TO 1 TABLET BY  MOUTH ONCE DAILY AND 1  TABLET AT BEDTIME 180 tablet 1  . Diclofenac Sodium 1.5 % SOLN Place 2 mLs onto the skin 4 (four) times daily. 3 Bottle 3  . DULoxetine (CYMBALTA) 60 MG capsule Take 1 capsule (60 mg total) by mouth  daily. 90 capsule 3  . esomeprazole (NEXIUM) 20 MG capsule Take 1 capsule (20 mg total) by mouth daily. 90 capsule 3  . famotidine (PEPCID) 40 MG tablet Take 1 tablet (40 mg total) by mouth daily. 90 tablet 3  . ferrous sulfate  325 (65 FE) MG EC tablet Take 325 mg by mouth daily with breakfast.    . glucose blood test strip One Touch Ultra stripts blue-To check sugar once daily and as needed for DM2 250.00 100 each 3  . GUAIFENESIN CR PO Take by mouth as needed.    Marland Kitchen HYDROmorphone HCl (EXALGO) 12 MG T24A SR tablet Take 12 mg by mouth daily.    . hyoscyamine (LEVSIN SL) 0.125 MG SL tablet Take 1 tablet (0.125 mg total) by mouth every 4 (four) hours as needed for cramping. 270 tablet 3  . ketoconazole (NIZORAL) 2 % shampoo SHAMPOO WITH A SMALL AMOUNT AS DIRECTED THREE TIMES A WEEK SHAMPOO SCALP 3 DAYS A WEEK, LET SHAMPOO SIT FOR 10 MINUTES AND RINSE OFF 120 mL 2  . levothyroxine (SYNTHROID) 150 MCG tablet Take 1 tablet (150 mcg total) by mouth daily. 90 tablet 3  . LYSINE PO Take by mouth daily.    . Magnesium 400 MG CAPS Take by mouth daily.    . metFORMIN (GLUCOPHAGE) 500 MG tablet TAKE 1 TABLET BY MOUTH  TWICE DAILY WITH A MEAL 180 tablet 3  . metoprolol succinate (TOPROL-XL) 50 MG 24 hr tablet TAKE 1 TABLET BY MOUTH  TWICE DAILY TAKE WITH OR  IMMEDIATELY FOLLOWING A  MEAL 180 tablet 3  . oxyCODONE (OXYCONTIN) 10 MG 12 hr tablet Take 10 mg by mouth 3 (three) times daily as needed.     . predniSONE (DELTASONE) 5 MG tablet Take 2 tabs po am x 7 days, 1.5  tabs po am  x 7 days, 1  tabs po am x 7 days, .5  tab po am x 7 days 35 tablet 0  . rosuvastatin (CRESTOR) 5 MG tablet TAKE 1 TABLET BY MOUTH  TWICE WEEKLY 26 tablet 3  . torsemide (DEMADEX) 20 MG tablet TAKE 1 TABLET BY MOUTH  DAILY AS NEEDED 90 tablet 1   No current facility-administered medications for this visit.     Allergies:   Keflex [cephalexin], Penicillins, Amitriptyline hcl, Atorvastatin, Benicar [olmesartan medoxomil], Ceftin [cefuroxime], Cetirizine hcl, Ciprofloxacin, Clonidine derivatives, Diovan [valsartan], Erythromycin, Etodolac, Fluoxetine hcl, Furosemide, Gabapentin, Naproxen sodium, Paroxetine, Pregabalin, Sulfonamide derivatives, Tetracycline,  Venlafaxine, and Cephalexin   Social History:  The patient  reports that she has quit smoking. Her smoking use included cigarettes. She has never used smokeless tobacco. She reports that she does not drink alcohol and does not use drugs.   Family History:   family history includes Anemia in her brother; Coronary artery disease in her mother; Diabetes in her brother; Emphysema in her father; Heart disease in her brother; Iron deficiency in her daughter; Leukemia in her son; Lung cancer in her mother; Lymphoma in her brother and sister; Parkinson's disease in her brother.    Review of Systems: Review of Systems  Constitutional: Negative.   Respiratory: Negative.   Cardiovascular: Negative.   Gastrointestinal: Negative.   Musculoskeletal: Negative.   Skin:       Raynauds  Neurological: Negative.   Psychiatric/Behavioral: Negative.   All other systems reviewed and are negative.    PHYSICAL EXAM: VS:  There were no vitals taken for this visit. , BMI There is no height or weight on  file to calculate BMI. Constitutional:  oriented to person, place, and time. No distress.  HENT:  Head: Normocephalic and atraumatic.  Eyes:  no discharge. No scleral icterus.  Neck: Normal range of motion. Neck supple. No JVD present.  Cardiovascular: Normal rate, regular rhythm, normal heart sounds and intact distal pulses. Exam reveals no gallop and no friction rub. No edema No murmur heard. Pulmonary/Chest: Effort normal and breath sounds normal. No stridor. No respiratory distress.  no wheezes.  no rales.  no tenderness.  Abdominal: Soft.  no distension.  no tenderness.  Musculoskeletal: Normal range of motion.  no  tenderness or deformity.  Neurological:  normal muscle tone. Coordination normal. No atrophy Skin: Skin is warm and dry. No rash noted. not diaphoretic.  Psychiatric:  normal mood and affect. behavior is normal. Thought content normal.    Recent Labs: 08/20/2020: TSH 0.36 02/20/2021: ALT  37; BUN 29; Creat 1.15; Hemoglobin 13.4; Platelets 390; Potassium 4.6; Sodium 137    Lipid Panel Lab Results  Component Value Date   CHOL 168 08/20/2020   HDL 42.30 08/20/2020   LDLCALC 99 08/20/2020   TRIG 133.0 08/20/2020      Wt Readings from Last 3 Encounters:  02/20/21 201 lb 6.4 oz (91.4 kg)  01/21/21 199 lb 12.8 oz (90.6 kg)  01/13/21 207 lb (93.9 kg)       ASSESSMENT AND PLAN:   Mixed hyperlipidemia - Plan: EKG 12-Lead Previous CT scan with no coronary calcification or aortic atherosclerosis  She is inclined not to take a cholesterol medication  Essential hypertension - Plan: EKG 12-Lead Blood pressure is well controlled on today's visit. No changes made to the medications.  Type 2 diabetes mellitus without complication, without long-term current use of insulin ([place]) - Plan: EKG 12-Lead We have encouraged continued exercise, careful diet management in an effort to lose weight. Again discussed with her  Raynaud's disease without gangrene Taking diltiazem mainly in the winter,  tolerable in the summer Symptoms better when standing New prescription sent in for diltiazem  Bilateral lower extremity edema Minimal symptoms on today's visit Recommend she try torsemide every other day to avoid renal dysfunction and prerenal state  Disposition:   F/U as needed Refill prescriptions can be obtained through primary care if needed   Total encounter time more than 25 minutes  Greater than 50% was spent in counseling and coordination of care with the patient    No orders of the defined types were placed in this encounter.    Signed, Esmond Plants, M.D., Ph.D. 02/24/2021  [place], [place]

## 2021-02-25 ENCOUNTER — Ambulatory Visit: Payer: Medicare Other | Admitting: Cardiovascular Disease

## 2021-02-27 ENCOUNTER — Telehealth: Payer: Self-pay | Admitting: Family Medicine

## 2021-02-27 NOTE — Chronic Care Management (AMB) (Signed)
  Chronic Care Management   Note  02/27/2021 Name: [place] MRN: [id number] DOB: August 19, 1951  Tina Berger is a 70 y.o. year old female who is a primary care patient of Tower, [name], MD. I reached out to [name] by phone today in response to a referral sent by [name]'s PCP, Tower, [name], MD.   [name] was given information about Chronic Care Management services today including:  1. CCM service includes personalized support from designated clinical staff supervised by her physician, including individualized plan of care and coordination with other care providers 2. 24/7 contact phone numbers for assistance for urgent and routine care needs. 3. Service will only be billed when office clinical staff spend 20 minutes or more in a month to coordinate care. 4. Only one practitioner may furnish and bill the service in a calendar month. 5. The patient may stop CCM services at any time (effective at the end of the month) by phone call to the office staff.   Patient agreed to services and verbal consent obtained.   Follow up plan:   Lauretta Grill [employer] Scheduler

## 2021-03-03 ENCOUNTER — Other Ambulatory Visit: Payer: Self-pay

## 2021-03-03 ENCOUNTER — Other Ambulatory Visit
Admission: RE | Admit: 2021-03-03 | Discharge: 2021-03-03 | Disposition: A | Discharge status: Home / Self Care | Payer: Medicare Other | Source: Ambulatory Visit | Attending: Internal Medicine | Admitting: Internal Medicine

## 2021-03-03 ENCOUNTER — Other Ambulatory Visit (HOSPITAL_COMMUNITY): Payer: Medicare Other

## 2021-03-03 DIAGNOSIS — Z01812 Encounter for preprocedural laboratory examination: Secondary | ICD-10-CM | POA: Diagnosis not present

## 2021-03-03 DIAGNOSIS — Z20822 Contact with and (suspected) exposure to covid-19: Secondary | ICD-10-CM | POA: Diagnosis not present

## 2021-03-03 LAB — SARS CORONAVIRUS 2 (TAT 6-24 HRS): SARS Coronavirus 2: NEGATIVE

## 2021-03-05 ENCOUNTER — Other Ambulatory Visit: Payer: Self-pay | Admitting: Internal Medicine

## 2021-03-05 DIAGNOSIS — R0609 Other forms of dyspnea: Secondary | ICD-10-CM

## 2021-03-05 DIAGNOSIS — R0600 Dyspnea, unspecified: Secondary | ICD-10-CM

## 2021-03-05 NOTE — Progress Notes (Signed)
pft  

## 2021-03-06 ENCOUNTER — Institutional Professional Consult (permissible substitution): Payer: Medicare Other | Admitting: Internal Medicine

## 2021-03-12 ENCOUNTER — Telehealth: Payer: Self-pay | Admitting: Rheumatology

## 2021-03-12 NOTE — Telephone Encounter (Signed)
Patient left a message requesting an rx for Zofran sent to [facility] on [address] in [place]. Patient states she had an rx last year for it, but it has since expired. Please call to advise.

## 2021-03-12 NOTE — Telephone Encounter (Signed)
Next Visit: 05/22/2021  Last Visit: 02/20/2021  Last Fill: by Duffy Bruce, MD - emergency room   Dx: Rheumatoid arthritis of multiple sites with negative rheumatoid factor  Current Dose per office note on 02/20/2021, not mentioned  Okay to refill Zofran?

## 2021-03-13 MED ORDER — ONDANSETRON HCL 4 MG PO TABS: 4.0000 mg | ORAL_TABLET | Freq: Three times a day (TID) | ORAL | 0 refills | Status: DC | PRN

## 2021-03-19 ENCOUNTER — Encounter: Payer: Self-pay | Admitting: Rheumatology

## 2021-03-19 NOTE — Telephone Encounter (Signed)
Please schedule a visit with Dr. Estanislado Pandy to discuss other treatment options.

## 2021-03-23 NOTE — Progress Notes (Signed)
Office Visit Note  Patient: Tina Berger             Date of Birth: Mar 14, 1951           MRN: [id number]             PCP: Abner Greenspan, MD Referring: Tower, [name], MD Visit Date: 03/24/2021 Occupation: @GUAROCC @  Subjective:  Other (Patient reports she was experiencing cognitive difficulty, constipation, fatigue and vomiting while taking imuran. Patient reports improvement in symptoms since she discontinued imuran on 03/19/2021. )   History of Present Illness: IVANNAH Berger is a 70 y.o. female history of rheumatoid arthritis, interstitial lung disease, osteoarthritis, degenerative disc disease and fibromyalgia syndrome.  She states she started Imuran about 8 weeks ago but she had to discontinue due to nausea vomiting, constipation and cognitive difficulties.  She has been off Imuran for about 5 days now and her symptoms have resolved.  She continues to have some pain and discomfort in her hands and feet.  She does not notice any increased shortness of breath.  Activities of Daily Living:  Patient reports morning stiffness for all day. Patient Reports nocturnal pain.  Difficulty dressing/grooming: Reports  Difficulty climbing stairs: Denies Difficulty getting out of chair: Denies Difficulty using hands for taps, buttons, cutlery, and/or writing: Reports  Review of Systems  Constitutional: Positive for fatigue.  HENT: Positive for mouth sores and mouth dryness. Negative for nose dryness.   Eyes: Positive for dryness. Negative for pain and itching.  Respiratory: Negative for shortness of breath and difficulty breathing.   Cardiovascular: Negative for chest pain and palpitations.  Gastrointestinal: Positive for constipation. Negative for blood in stool and diarrhea.  Endocrine: Negative for increased urination.  Genitourinary: Negative for difficulty urinating.  Musculoskeletal: Positive for arthralgias, joint pain, joint swelling, myalgias, morning stiffness, muscle tenderness  and myalgias.  Skin: Positive for color change. Negative for rash, redness and sensitivity to sunlight.  Allergic/Immunologic: Negative for susceptible to infections.  Neurological: Positive for headaches and weakness. Negative for dizziness, numbness and memory loss.  Hematological: Positive for bruising/bleeding tendency.  Psychiatric/Behavioral: Positive for depressed mood. Negative for confusion and sleep disturbance. The patient is not nervous/anxious.     PMFS History:  Patient Active Problem List   Diagnosis Date Noted  . CAD (coronary artery disease) 01/22/2021  . Primary osteoarthritis of both knees 01/12/2021  . Positive ANA (antinuclear antibody) 11/17/2020  . Joint pain 11/13/2020  . Elevated serum creatinine 08/26/2020  . Current use of proton pump inhibitor 08/26/2020  . Estrogen deficiency 08/21/2018  . TMJ (dislocation of temporomandibular joint), initial encounter 05/05/2018  . Anterior neck pain 05/05/2018  . Chronic pain syndrome 11/09/2017  . Chronic upper extremity pain [facility]) (Bilateral) (L>R) 11/09/2017  . Fibromyalgia syndrome 11/09/2017  . Osteoarthritis 11/09/2017  . Osteoarthritis of lumbar facet joint (Bilateral) 11/09/2017  . Lumbar facet arthropathy (Bilateral) 11/09/2017  . Lumbar facet syndrome (Bilateral) (L>R) 11/09/2017  . Lumbar foraminal stenosis (multilevel) (Bilateral) 11/09/2017  . DDD (degenerative disc disease), lumbar 11/09/2017  . Thoracic levoscoliosis 11/09/2017  . Thoracic facet syndrome (Bilateral) (L>R) 11/09/2017  . Thoracic facet arthropathy (Bilateral) (R>L) 11/09/2017  . DDD (degenerative disc disease), thoracic 11/09/2017  . Thoracolumbar IVDD 11/09/2017  . DDD (degenerative disc disease), cervical 11/09/2017  . Osteoarthritis of cervical facet (Bilateral) 11/09/2017  . Grade 1 Anterolisthesis of C7 over T1 11/09/2017  . Cervical foraminal stenosis (Bilateral) 11/09/2017  . History of fusion of cervical spine  (  C5-6 ACDF) 11/09/2017  . Cervical facet syndrome (Bilateral) 11/09/2017  . Disorder of skeletal system 11/09/2017  . Cervical radiculitis (Bilateral) 11/09/2017  . Lumbar Epidural lipomatosis 11/09/2017  . Chronic sacroiliac joint pain (Bilateral) (L>R) 11/09/2017  . Chronic hip pain (Bilateral) (L>R) 11/09/2017  . Chronic upper back pain (Primary Area of Pain) (midline) 10/24/2017  . Chronic neck pain (Secondary Area of Pain) (Bilateral)  (L>R) 10/24/2017  . Chronic low back pain (Fourth Area of Pain) (Bilateral) (L>R) 10/24/2017  . Chronic lower extremity pain (Fifth Area of Pain) (Bilateral) (L>R) 10/24/2017  . Lumbar Grade 1 Retrolisthesis of L1-2 and L2-3 10/24/2017  . Other long term (current) drug therapy 10/24/2017  . Other specified health status 10/24/2017  . Long term current use of opiate analgesic 10/24/2017  . Long term prescription opiate use 10/24/2017  . Opiate use 10/24/2017  . DM type 2 (diabetes mellitus, type 2) ([place]) 06/14/2014  . Pharmacologic therapy 06/14/2014  . Pedal edema   . Post-menopausal 06/23/2012  . Lumbar disc disease with radiculopathy 11/18/2011  . HTN (hypertension) 07/30/2011  . Raynaud disease 07/30/2011  . Obesity 07/30/2011  . Routine general medical examination at a health care facility 03/28/2011  . Palpitations 04/27/2010  . Problems influencing health status 12/11/2009  . Depression with anxiety 06/14/2008  . Vitamin D deficiency 05/13/2008  . ANXIETY 03/25/2008  . Osteopenia 03/25/2008  . Hypothyroidism 03/28/2007  . Hyperlipidemia associated with type 2 diabetes mellitus ([place]) 03/28/2007  . Other iron deficiency anemias 03/28/2007  . PANIC ATTACK 03/28/2007  . KERATOCONJUNCTIVITIS SICCA 03/28/2007  . Mitral valve disorder 03/28/2007  . ABNORMAL HEART RHYTHMS 03/28/2007  . Raynaud's syndrome 03/28/2007  . GERD 03/28/2007  . IBS 03/28/2007  . Rosacea 03/28/2007  . PLANTAR FASCIITIS 03/28/2007  . MIGRAINES, HX OF 03/28/2007     Past Medical History:  Diagnosis Date  . Acute renal insufficiency 05/31/2014  . Anemia, iron deficiency   . Bilateral lower extremity edema    a. uses torsemide  . Cervical dysplasia    abnormal paps  . Colon cancer screening 06/14/2014  . Cough 08/08/2012  . Degenerative disk disease 11/19/2011  . Diabetes mellitus type II    Diet controlled  . Diverticulosis   . DJD (degenerative joint disease)   . Drug rash 05/22/2011  . Dry eyes   . Edema   . Elevated liver enzymes 03/21/2012  . Encounter for routine gynecological examination 06/14/2014  . ESOPHAGITIS 03/28/2007   Qualifier: Hospitalized for  By: Marcelino Scot CMA, [place]    . Fibromyalgia   . GASTRITIS 03/28/2007   Qualifier: History of  By: Marcelino Scot CMA, [place] Bilberry    . GERD (gastroesophageal reflux disease)   . Hemorrhoids    external  . HLD (hyperlipidemia)   . HNP (herniated nucleus pulposus) 2/99   T6,7,8 with DJD  . HTN (hypertension)   . Hyperglycemia 05/13/2008   Qualifier: Diagnosis of  By: Glori Bickers MD, Carmell Austria   . Hypothyroidism   . Interstitial lung disease ([place])    ?  Marland Kitchen Left ovarian cyst    x 3, rupture  . Osteoarthritis    hands  . Osteopenia    mild-11/01; improved 12/05  . Other screening mammogram 08/18/2011  . Palpitations   . PERSONAL HISTORY ALLERGY UNSPEC MEDICINAL AGENT 03/18/2010   Qualifier: Diagnosis of  By: Glori Bickers MD, Carmell Austria   . Raynaud's disease   . Recurrent HSV (herpes simplex virus)    lesions in nose  .  Rhinitis 03/21/2012  . Routine general medical examination at a health care facility 03/28/2011    Family History  Problem Relation Age of Onset  . Emphysema Father        + smoker  . Lung cancer Mother        + smoker  . Coronary artery disease Mother        relatively young  . Lymphoma Sister   . Heart disease Brother   . Lymphoma Brother   . Parkinson's disease Brother   . Anemia Brother        aplastic   . Diabetes Brother   . Leukemia Son   . Iron deficiency Daughter   .  Breast cancer Neg Hx    Past Surgical History:  Procedure Laterality Date  . ABDOMINAL EXPLORATION SURGERY    . COLONOSCOPY  11/01   Diverticulosis; hemorrhoids  . HAND SURGERY     left thumb  . LASIK     bilateral   Social History   Social History Narrative   Married      1 child      Network engineer to Scientist, physiological at [employer]      Does regularly exercise      Daily caffeine use: 2/day         Immunization History  Administered Date(s) Administered  . Fluad Quad(high Dose 65+) 08/07/2020  . Influenza Split 06/19/2011  . Influenza Whole 08/01/2009  . Influenza, High Dose Seasonal PF 08/14/2018, 07/03/2019  . Influenza,inj,Quad PF,6+ Mos 06/14/2014, 08/25/2015, 07/26/2016, 08/19/2017  . Moderna Sars-Covid-2 Vaccination 09/17/2020  . PFIZER(Purple Top)SARS-COV-2 Vaccination 11/27/2019, 12/18/2019  . Pneumococcal Conjugate-13 08/25/2015  . Pneumococcal Polysaccharide-23 08/25/2016  . Td 11/25/2003  . Tdap 06/14/2014  . Zoster, Live 09/16/2014     Objective: Vital Signs: BP 112/74 (BP Location: Left Arm, Patient Position: Sitting, Cuff Size: Normal)   Pulse 73   Resp 15   Ht 5\' 2"  (1.575 m)   Wt 200 lb (90.7 kg)   BMI 36.58 kg/m    Physical Exam Vitals and nursing note reviewed.  Constitutional:      Appearance: She is well-developed.  HENT:     Head: Normocephalic and atraumatic.  Eyes:     Conjunctiva/sclera: Conjunctivae normal.  Cardiovascular:     Rate and Rhythm: Normal rate and regular rhythm.     Heart sounds: Normal heart sounds.  Pulmonary:     Effort: Pulmonary effort is normal.     Breath sounds: Normal breath sounds.  Abdominal:     General: Bowel sounds are normal.     Palpations: Abdomen is soft.  Musculoskeletal:     Cervical back: Normal range of motion.  Lymphadenopathy:     Cervical: No cervical adenopathy.  Skin:    General: Skin is warm and dry.     Capillary Refill: Capillary refill takes less than 2 seconds.  Neurological:     Mental  Status: She is alert and oriented to person, place, and time.  Psychiatric:        Behavior: Behavior normal.      Musculoskeletal Exam: Spine with good range of motion.  Shoulder joints, elbow joints, wrist joints, MCPs PIPs and DIPs were in good range of motion.  She has bilateral PIP and DIP thickening more prominent in the right hand due to osteoarthritis.  No synovitis was noted.  Hip joints, ankle joints, MTPs and PIPs with good range of motion.  She has bilateral PIP and DIP thickening with no  synovitis.  CDAI Exam: CDAI Score: 0.8  Patient Global: 6 mm; Provider Global: 2 mm Swollen: 0 ; Tender: 0  Joint Exam 03/24/2021   No joint exam has been documented for this visit   There is currently no information documented on the homunculus. Go to the Rheumatology activity and complete the homunculus joint exam.  Investigation: No additional findings.  Imaging: No results found.  Recent Labs: Lab Results  Component Value Date   WBC 9.6 02/20/2021   HGB 13.4 02/20/2021   PLT 390 02/20/2021   NA 137 02/20/2021   K 4.6 02/20/2021   CL 94 (L) 02/20/2021   CO2 32 02/20/2021   GLUCOSE 157 (H) 02/20/2021   BUN 29 (H) 02/20/2021   CREATININE 1.15 (H) 02/20/2021   BILITOT 0.4 02/20/2021   ALKPHOS 81 02/06/2021   AST 27 02/20/2021   ALT 37 (H) 02/20/2021   PROT 7.3 02/20/2021   ALBUMIN 4.6 02/06/2021   CALCIUM 9.6 02/20/2021   GFRAA 55 (L) 02/20/2021   QFTBGOLDPLUS NEGATIVE 01/13/2021    Speciality Comments: No specialty comments available.  Procedures:  No procedures performed Allergies: Keflex [cephalexin], Penicillins, Amitriptyline hcl, Atorvastatin, Benicar [olmesartan medoxomil], Ceftin [cefuroxime], Cetirizine hcl, Ciprofloxacin, Clonidine derivatives, Diovan [valsartan], Erythromycin, Etodolac, Fluoxetine hcl, Furosemide, Gabapentin, Naproxen sodium, Paroxetine, Pregabalin, Sulfonamide derivatives, Tetracycline, Venlafaxine, and Cephalexin   Assessment / Plan:      Visit Diagnoses: Rheumatoid arthritis of multiple sites with negative rheumatoid factor (HCC) - +CCP Ab, +CarP Ab, +ANA, inflammatory arthritis: Patient gives history of intermittent swelling.  I do not see any synovitis on examination today.  I believe most of her discomfort is coming from underlying osteoarthritis.  She does not need strong immunosuppression for her the arthritis.  High risk medication use -(Imuran 100mg  by mouth daily-started January 22, 2021 and discontinued March 19, 2021 due to nausea, vomiting and fatigue.)  - Plan: CBC with Differential/Platelet, COMPLETE METABOLIC PANEL WITH GFR today.  I advised her to get COVID-19 booster and other immunization prior to starting immunosuppressive therapy.  ILD (interstitial lung disease) ([place]) - High resolution chest CT performed on 01/16/21: The appearance of the lungs is compatible with interstitial lung disease which is mildly progressive when compared to the previous CT scan.  She has an appointment coming up with Dr. Chase Caller.  I discussed possible use of CellCept.  Side effects were discussed at length.  I have given her a handout on CellCept.  I advised her to discuss this further with Dr. Chase Caller.    Raynaud's disease without gangrene-she continues to have Raynaud's phenomenon especially in her feet.  Keeping body temperature warm was discussed.  Primary osteoarthritis of both knees - Bilateral mild osteoarthritis and mild chondromalacia patella.  She has chronic discomfort.  DDD (degenerative disc disease), cervical - H/o fusion.  She has fairly good range of motion without discomfort.  History of fusion of cervical spine (C5-6 ACDF)  DDD (degenerative disc disease), thoracic-chronic pain  DDD (degenerative disc disease), lumbar-chronic pain  Fibromyalgia syndrome-chronic pain she continues to have generalized hyperalgesia and positive tender points.  Chronic pain syndrome  Long term current use of opiate analgesic -  hydromophone HCI 12 mg daily.   Chronic renal impairment stage IIIa-her GFR is in 50s.  She has taken anti-inflammatories in the past.  I strongly advised her not to take any NSAIDs.  Primary hypertension-blood pressure is normal today.  Mitral valve disorder  Hyperlipidemia associated with type 2 diabetes mellitus (HCC)  History of gastroesophageal reflux (  GERD)  Type 2 diabetes mellitus without complication, without long-term current use of insulin (HCC)  Depression with anxiety  History of hypothyroidism  History of IBS  Orders: Orders Placed This Encounter  Procedures  . CBC with Differential/Platelet  . COMPLETE METABOLIC PANEL WITH GFR   No orders of the defined types were placed in this encounter.  .  Follow-Up Instructions: Return in about 6 weeks (around 05/05/2021) for Rheumatoid arthritis, ILD.   Bo Merino, MD  Note - This record has been created using Editor, commissioning.  Chart creation errors have been sought, but may not always  have been located. Such creation errors do not reflect on  the standard of medical care.

## 2021-03-24 ENCOUNTER — Ambulatory Visit: Payer: Medicare Other | Admitting: Rheumatology

## 2021-03-24 ENCOUNTER — Other Ambulatory Visit: Payer: Self-pay

## 2021-03-24 ENCOUNTER — Encounter: Payer: Self-pay | Admitting: Rheumatology

## 2021-03-24 VITALS — BP 112/74 | HR 73 | Resp 15 | Ht 62.0 in | Wt 200.0 lb

## 2021-03-24 DIAGNOSIS — M5134 Other intervertebral disc degeneration, thoracic region: Secondary | ICD-10-CM

## 2021-03-24 DIAGNOSIS — Z79899 Other long term (current) drug therapy: Secondary | ICD-10-CM | POA: Diagnosis not present

## 2021-03-24 DIAGNOSIS — J849 Interstitial pulmonary disease, unspecified: Secondary | ICD-10-CM | POA: Diagnosis not present

## 2021-03-24 DIAGNOSIS — G894 Chronic pain syndrome: Secondary | ICD-10-CM | POA: Diagnosis not present

## 2021-03-24 DIAGNOSIS — Z79891 Long term (current) use of opiate analgesic: Secondary | ICD-10-CM | POA: Diagnosis not present

## 2021-03-24 DIAGNOSIS — M5136 Other intervertebral disc degeneration, lumbar region: Secondary | ICD-10-CM | POA: Diagnosis not present

## 2021-03-24 DIAGNOSIS — Z981 Arthrodesis status: Secondary | ICD-10-CM

## 2021-03-24 DIAGNOSIS — M5030 Other cervical disc degeneration, unspecified cervical region: Secondary | ICD-10-CM | POA: Diagnosis not present

## 2021-03-24 DIAGNOSIS — I7300 Raynaud's syndrome without gangrene: Secondary | ICD-10-CM

## 2021-03-24 DIAGNOSIS — M797 Fibromyalgia: Secondary | ICD-10-CM

## 2021-03-24 DIAGNOSIS — Z8639 Personal history of other endocrine, nutritional and metabolic disease: Secondary | ICD-10-CM

## 2021-03-24 DIAGNOSIS — M170 Bilateral primary osteoarthritis of knee: Secondary | ICD-10-CM | POA: Diagnosis not present

## 2021-03-24 DIAGNOSIS — E1169 Type 2 diabetes mellitus with other specified complication: Secondary | ICD-10-CM

## 2021-03-24 DIAGNOSIS — F418 Other specified anxiety disorders: Secondary | ICD-10-CM

## 2021-03-24 DIAGNOSIS — E785 Hyperlipidemia, unspecified: Secondary | ICD-10-CM

## 2021-03-24 DIAGNOSIS — I10 Essential (primary) hypertension: Secondary | ICD-10-CM

## 2021-03-24 DIAGNOSIS — M0609 Rheumatoid arthritis without rheumatoid factor, multiple sites: Secondary | ICD-10-CM | POA: Diagnosis not present

## 2021-03-24 DIAGNOSIS — Z8719 Personal history of other diseases of the digestive system: Secondary | ICD-10-CM

## 2021-03-24 DIAGNOSIS — E119 Type 2 diabetes mellitus without complications: Secondary | ICD-10-CM

## 2021-03-24 DIAGNOSIS — I059 Rheumatic mitral valve disease, unspecified: Secondary | ICD-10-CM

## 2021-03-24 DIAGNOSIS — N1831 Chronic kidney disease, stage 3a: Secondary | ICD-10-CM

## 2021-03-24 NOTE — Patient Instructions (Addendum)
Mycophenolate tablets What is this medicine? MYCOPHENOLATE MOFETIL (mye koe FEN oh late MOE fe til) is used to decrease the immune system's response to a transplanted organ. This medicine may be used for other purposes; ask your health care provider or pharmacist if you have questions. COMMON BRAND NAME(S): CellCept What should I tell my health care provider before I take this medicine? They need to know if you have any of these conditions:  anemia or other blood disorder  cancer  diarrhea  immune system problems  infection (especially a viral infection such as chickenpox, cold sores, or herpes)  kidney disease  recently received or scheduled to receive a vaccination  stomach problems  an unusual or allergic reaction to mycophenolate mofetil, other medicines, foods, dyes, or preservatives  pregnant or trying to get pregnant  breast-feeding How should I use this medicine? Take this medicine by mouth with a full glass of water. Follow the directions on the prescription label. Take this medicine on an empty stomach, at least 1 hour before or 2 hours after food. Do not take with food unless your doctor approves. Do not cut, crush, or chew the medicine. Swallow the tablets whole. If the medicine is broken or is not intact, do not get the powder on your skin or eyes. If contact occurs, rinse thoroughly with water. Take your medicine at regular intervals. Do not take your medicine more often than directed. Do not stop taking except on your doctor's advice. A special MedGuide will be given to you by the pharmacist with each prescription and refill. Be sure to read this information carefully each time. Talk to your pediatrician regarding the use of this medicine in children. While this drug may be prescribed for selected conditions, precautions do apply. Overdosage: If you think you have taken too much of this medicine contact a poison control center or emergency room at once. NOTE: This  medicine is only for you. Do not share this medicine with others. What if I miss a dose? If you miss a dose, take it as soon as you can. If it is almost time for your next dose, take only that dose. Do not take double or extra doses. What may interact with this medicine? Do not take this medicine with any of the following medications:  live vaccines This medicine may also interact with the following medications:  acyclovir or valacyclovir  azathioprine  birth control pills  certain antibiotics like ciprofloxacin, levofloxacin, norfloxacin, trimethoprim; sulfamethoxazole, penicillin, and amoxicillin; clavulanic acid  certain medicines for stomach problems like lansoprazole, omeprazole, or pantoprazole  cyclosporine  ganciclovir or valganciclovir  isavuconazonium  medicines for cholesterol like cholestyramine and colestipol  metronidazole  other mycophenolate medicines  probenecid  rifampin  sevelamer  stomach acid blockers like magnesium hydroxide and aluminum hydroxide  telmisartan This list may not describe all possible interactions. Give your health care provider a list of all the medicines, herbs, non-prescription drugs, or dietary supplements you use. Also tell them if you smoke, drink alcohol, or use illegal drugs. Some items may interact with your medicine. What should I watch for while using this medicine? Visit your doctor or health care professional for regular checks on your progress. You will need frequent blood checks during the first few months you are receiving the medicine. Keep out of the sun. If you cannot avoid being in the sun, wear protective clothing and use sunscreen. Do not use sun lamps or tanning beds/booths. This medicine can cause birth defects. Do not  get pregnant while taking this drug. Females will need to have a negative pregnancy test before starting this medicine. If sexually active, use 2 reliable forms of birth control together for 4  weeks before starting this medicine, while you are taking this medicine, and for 6 weeks after you stop taking this medicine. Birth control pills alone may not work properly while you are taking this medicine. If you think that you might be pregnant talk to your doctor right away. Males who get this medicine must use a condom during sex with females who can get pregnant. If you get a woman pregnant, the baby could have birth defects. The baby could die before they are born. You will need to continue wearing a condom for 90 days after stopping the medicine. Tell your health care provider right away if your partner becomes pregnant while you are taking this medicine. Do not donate sperm while taking this medicine or for 90 days after stopping it. If you get a cold or other infection while receiving this medicine, call your doctor or health care professional. Do not treat yourself. The medicine may decrease your body's ability to fight infections. Do not give blood while taking this medicine or for 6 weeks after stopping it. You may get drowsy or dizzy. Do not drive, use machinery, or do anything that needs mental alertness until you know how this medicine affects you. Do not stand up or sit up quickly, especially if you are an older patient. This reduces the risk of dizzy or fainting spells. What side effects may I notice from receiving this medicine? Side effects that you should report to your doctor or health care professional as soon as possible:  allergic reactions like skin rash, itching or hives, swelling of the face, lips, or tongue  bloody, dark, or tarry stools  changes in vision  dizziness  fever, chills or any other sign of infection  unusual bleeding or bruising  unusually weak or tired Side effects that usually do not require medical attention (report to your doctor or health care professional if they continue or are bothersome):  constipation  diarrhea  trouble sleeping  loss  of appetite  nausea, vomiting This list may not describe all possible side effects. Call your doctor for medical advice about side effects. You may report side effects to FDA at 1-800-FDA-1088. Where should I keep my medicine? Keep out of the reach of children. Store at room temperature between 15 and 30 degrees C (59 and 86 degrees F). Protect from light. Throw away any unused medicine after the expiration date. NOTE: This sheet is a summary. It may not cover all possible information. If you have questions about this medicine, talk to your doctor, pharmacist, or health care provider.  2021 Elsevier/Gold Standard (2019-02-19 19:54:00)  Vaccines You are taking a medication(s) that can suppress your immune system.  The following immunizations are recommended: . Flu annually . Covid-19  . Td/Tdap (tetanus, diphtheria, pertussis) every 10 years . Pneumonia (Prevnar 15 then Pneumovax 23 at least 1 year apart.  Alternatively, can take Prevnar 20 without needing additional dose) . Shingrix (after age 4): 2 doses from 4 weeks to 6 months apart  Please check with your PCP to make sure you are up to date.

## 2021-03-25 LAB — CBC WITH DIFFERENTIAL/PLATELET
Absolute Monocytes: 1067 cells/uL — ABNORMAL HIGH (ref 200–950)
Basophils Absolute: 88 cells/uL (ref 0–200)
Basophils Relative: 0.8 %
Eosinophils Absolute: 550 cells/uL — ABNORMAL HIGH (ref 15–500)
Eosinophils Relative: 5 %
HCT: 40.4 % (ref 35.0–45.0)
Hemoglobin: 13.1 g/dL (ref 11.7–15.5)
Lymphs Abs: 1958 cells/uL (ref 850–3900)
MCH: 29.2 pg (ref 27.0–33.0)
MCHC: 32.4 g/dL (ref 32.0–36.0)
MCV: 90.2 fL (ref 80.0–100.0)
MPV: 9.5 fL (ref 7.5–12.5)
Monocytes Relative: 9.7 %
Neutro Abs: 7337 cells/uL (ref 1500–7800)
Neutrophils Relative %: 66.7 %
Platelets: 505 10*3/uL — ABNORMAL HIGH (ref 140–400)
RBC: 4.48 10*6/uL (ref 3.80–5.10)
RDW: 14.1 % (ref 11.0–15.0)
Total Lymphocyte: 17.8 %
WBC: 11 10*3/uL — ABNORMAL HIGH (ref 3.8–10.8)

## 2021-03-25 LAB — COMPLETE METABOLIC PANEL WITH GFR
AG Ratio: 1.4 (calc) (ref 1.0–2.5)
ALT: 20 U/L (ref 6–29)
AST: 20 U/L (ref 10–35)
Albumin: 4.3 g/dL (ref 3.6–5.1)
Alkaline phosphatase (APISO): 82 U/L (ref 37–153)
BUN: 24 mg/dL (ref 7–25)
CO2: 30 mmol/L (ref 20–32)
Calcium: 9.4 mg/dL (ref 8.6–10.4)
Chloride: 99 mmol/L (ref 98–110)
Creat: 0.9 mg/dL (ref 0.60–0.93)
GFR, Est African American: 75 mL/min/{1.73_m2} (ref >=60)
GFR, Est Non African American: 64 mL/min/{1.73_m2} (ref >=60)
Globulin: 3.1 g/dL (calc) (ref 1.9–3.7)
Glucose, Bld: 96 mg/dL (ref 65–99)
Potassium: 4.6 mmol/L (ref 3.5–5.3)
Sodium: 138 mmol/L (ref 135–146)
Total Bilirubin: 0.3 mg/dL (ref 0.2–1.2)
Total Protein: 7.4 g/dL (ref 6.1–8.1)

## 2021-03-25 NOTE — Progress Notes (Signed)
White cell count is mildly elevated.  CMP is normal.  We will continue to monitor labs.

## 2021-03-29 ENCOUNTER — Emergency Department
Admission: EM | Admit: 2021-03-29 | Discharge: 2021-03-29 | Disposition: A | Discharge status: Home / Self Care | Payer: Medicare Other | Attending: Emergency Medicine | Admitting: Emergency Medicine

## 2021-03-29 ENCOUNTER — Other Ambulatory Visit: Payer: Self-pay

## 2021-03-29 ENCOUNTER — Encounter: Payer: Self-pay | Admitting: Emergency Medicine

## 2021-03-29 DIAGNOSIS — I2510 Atherosclerotic heart disease of native coronary artery without angina pectoris: Secondary | ICD-10-CM | POA: Insufficient documentation

## 2021-03-29 DIAGNOSIS — W1830XA Fall on same level, unspecified, initial encounter: Secondary | ICD-10-CM | POA: Insufficient documentation

## 2021-03-29 DIAGNOSIS — S91012A Laceration without foreign body, left ankle, initial encounter: Secondary | ICD-10-CM | POA: Insufficient documentation

## 2021-03-29 DIAGNOSIS — I10 Essential (primary) hypertension: Secondary | ICD-10-CM | POA: Diagnosis not present

## 2021-03-29 DIAGNOSIS — T148XXA Other injury of unspecified body region, initial encounter: Secondary | ICD-10-CM

## 2021-03-29 DIAGNOSIS — L7622 Postprocedural hemorrhage and hematoma of skin and subcutaneous tissue following other procedure: Secondary | ICD-10-CM | POA: Diagnosis not present

## 2021-03-29 DIAGNOSIS — W19XXXA Unspecified fall, initial encounter: Secondary | ICD-10-CM | POA: Diagnosis not present

## 2021-03-29 DIAGNOSIS — Z743 Need for continuous supervision: Secondary | ICD-10-CM | POA: Diagnosis not present

## 2021-03-29 DIAGNOSIS — D1800 Hemangioma unspecified site: Secondary | ICD-10-CM | POA: Diagnosis not present

## 2021-03-29 DIAGNOSIS — S8992XA Unspecified injury of left lower leg, initial encounter: Secondary | ICD-10-CM | POA: Diagnosis present

## 2021-03-29 DIAGNOSIS — Z7984 Long term (current) use of oral hypoglycemic drugs: Secondary | ICD-10-CM | POA: Diagnosis not present

## 2021-03-29 DIAGNOSIS — D1801 Hemangioma of skin and subcutaneous tissue: Secondary | ICD-10-CM | POA: Diagnosis not present

## 2021-03-29 DIAGNOSIS — R0902 Hypoxemia: Secondary | ICD-10-CM | POA: Diagnosis not present

## 2021-03-29 DIAGNOSIS — E039 Hypothyroidism, unspecified: Secondary | ICD-10-CM | POA: Insufficient documentation

## 2021-03-29 DIAGNOSIS — E119 Type 2 diabetes mellitus without complications: Secondary | ICD-10-CM | POA: Diagnosis not present

## 2021-03-29 DIAGNOSIS — Z87891 Personal history of nicotine dependence: Secondary | ICD-10-CM | POA: Insufficient documentation

## 2021-03-29 DIAGNOSIS — Z79899 Other long term (current) drug therapy: Secondary | ICD-10-CM | POA: Insufficient documentation

## 2021-03-29 MED ORDER — LIDOCAINE-EPINEPHRINE (PF) 1 %-1:200000 IJ SOLN
10.0000 mL | Freq: Once | INTRAMUSCULAR | Status: AC
  Administered 2021-03-29: 10 mL
  Filled 2021-03-29: qty 10

## 2021-03-29 MED ORDER — TRANEXAMIC ACID FOR EPISTAXIS
1000.0000 mg | Freq: Once | TOPICAL | Status: AC
  Administered 2021-03-29: 1000 mg via TOPICAL
  Filled 2021-03-29: qty 10

## 2021-03-29 NOTE — ED Notes (Signed)
Wrapped gauze applied to area of L ankle, +2 pedal pulses

## 2021-03-29 NOTE — ED Provider Notes (Signed)
[facility] Emergency Department Provider Note  ____________________________________________  Time seen: Approximately 7:48 PM  I have reviewed the triage vital signs and the nursing notes.   HISTORY  Chief Complaint Ankle Hemorrhage    HPI Tina Berger is a 70 y.o. female who presents the emergency department complaining of a bleeding lesion to the anterior left ankle.  Patient states that she had a lesion that had formed to the left ankle that over the past couple weeks has been drying out and changing in appearance.  Patient reports that she had what appeared to be like a scab to the lesion and as she touched it with her finger this "fell off" and she started having bleeding.  This was a sprain type bleeding.  She denies any new injury to the area.  Bleeding is ongoing but is controllable with direct pressure.  No other injury or complaint at this time.  Medical history as described below with no complaints of chronic medical issues.       Past Medical History:  Diagnosis Date   Acute renal insufficiency 05/31/2014   Anemia, iron deficiency    Bilateral lower extremity edema    a. uses torsemide   Cervical dysplasia    abnormal paps   Colon cancer screening 06/14/2014   Cough 08/08/2012   Degenerative disk disease 11/19/2011   Diabetes mellitus type II    Diet controlled   Diverticulosis    DJD (degenerative joint disease)    Drug rash 05/22/2011   Dry eyes    Edema    Elevated liver enzymes 03/21/2012   Encounter for routine gynecological examination 06/14/2014   ESOPHAGITIS 03/28/2007   Qualifier: Hospitalized for  By: Marcelino Scot CMA, [place] Bilberry     Fibromyalgia    GASTRITIS 03/28/2007   Qualifier: History of  By: Marcelino Scot CMA, [place] Bilberry     GERD (gastroesophageal reflux disease)    Hemorrhoids    external   HLD (hyperlipidemia)    HNP (herniated nucleus pulposus) 2/99   T6,7,8 with DJD   HTN (hypertension)    Hyperglycemia 05/13/2008    Qualifier: Diagnosis of  By: Glori Bickers MD, Carmell [place]    Hypothyroidism    Interstitial lung disease ([place])    ?   Left ovarian cyst    x 3, rupture   Osteoarthritis    hands   Osteopenia    mild-11/01; improved 12/05   Other screening mammogram 08/18/2011   Palpitations    PERSONAL HISTORY ALLERGY UNSPEC MEDICINAL AGENT 03/18/2010   Qualifier: Diagnosis of  By: Glori Bickers MD, Carmell Austria    Raynaud's disease    Recurrent HSV (herpes simplex virus)    lesions in nose   Rhinitis 03/21/2012   Routine general medical examination at a health care facility 03/28/2011    Patient Active Problem List   Diagnosis Date Noted   CAD (coronary artery disease) 01/22/2021   Primary osteoarthritis of both knees 01/12/2021   Positive ANA (antinuclear antibody) 11/17/2020   Joint pain 11/13/2020   Elevated serum creatinine 08/26/2020   Current use of proton pump inhibitor 08/26/2020   Estrogen deficiency 08/21/2018   TMJ (dislocation of temporomandibular joint), initial encounter 05/05/2018   Anterior neck pain 05/05/2018   Chronic pain syndrome 11/09/2017   Chronic upper extremity pain [facility] Area of Pain) (Bilateral) (L>R) 11/09/2017   Fibromyalgia syndrome 11/09/2017   Osteoarthritis 11/09/2017   Osteoarthritis of lumbar facet joint (Bilateral) 11/09/2017   Lumbar facet arthropathy (Bilateral) 11/09/2017  Lumbar facet syndrome (Bilateral) (L>R) 11/09/2017   Lumbar foraminal stenosis (multilevel) (Bilateral) 11/09/2017   DDD (degenerative disc disease), lumbar 11/09/2017   Thoracic levoscoliosis 11/09/2017   Thoracic facet syndrome (Bilateral) (L>R) 11/09/2017   Thoracic facet arthropathy (Bilateral) (R>L) 11/09/2017   DDD (degenerative disc disease), thoracic 11/09/2017   Thoracolumbar IVDD 11/09/2017   DDD (degenerative disc disease), cervical 11/09/2017   Osteoarthritis of cervical facet (Bilateral) 11/09/2017   Grade 1 Anterolisthesis of C7 over T1 11/09/2017   Cervical foraminal stenosis  (Bilateral) 11/09/2017   History of fusion of cervical spine (C5-6 ACDF) 11/09/2017   Cervical facet syndrome (Bilateral) 11/09/2017   Disorder of skeletal system 11/09/2017   Cervical radiculitis (Bilateral) 11/09/2017   Lumbar Epidural lipomatosis 11/09/2017   Chronic sacroiliac joint pain (Bilateral) (L>R) 11/09/2017   Chronic hip pain (Bilateral) (L>R) 11/09/2017   Chronic upper back pain (Primary Area of Pain) (midline) 10/24/2017   Chronic neck pain (Secondary Area of Pain) (Bilateral)  (L>R) 10/24/2017   Chronic low back pain (Fourth Area of Pain) (Bilateral) (L>R) 10/24/2017   Chronic lower extremity pain (Fifth Area of Pain) (Bilateral) (L>R) 10/24/2017   Lumbar Grade 1 Retrolisthesis of L1-2 and L2-3 10/24/2017   Other long term (current) drug therapy 10/24/2017   Other specified health status 10/24/2017   Long term current use of opiate analgesic 10/24/2017   Long term prescription opiate use 10/24/2017   Opiate use 10/24/2017   DM type 2 (diabetes mellitus, type 2) ([place]) 06/14/2014   Pharmacologic therapy 06/14/2014   Pedal edema    Post-menopausal 06/23/2012   Lumbar disc disease with radiculopathy 11/18/2011   HTN (hypertension) 07/30/2011   Raynaud disease 07/30/2011   Obesity 07/30/2011   Routine general medical examination at a health care facility 03/28/2011   Palpitations 04/27/2010   Problems influencing health status 12/11/2009   Depression with anxiety 06/14/2008   Vitamin D deficiency 05/13/2008   ANXIETY 03/25/2008   Osteopenia 03/25/2008   Hypothyroidism 03/28/2007   Hyperlipidemia associated with type 2 diabetes mellitus ([place]) 03/28/2007   Other iron deficiency anemias 03/28/2007   PANIC ATTACK 03/28/2007   KERATOCONJUNCTIVITIS SICCA 03/28/2007   Mitral valve disorder 03/28/2007   ABNORMAL HEART RHYTHMS 03/28/2007   Raynaud's syndrome 03/28/2007   GERD 03/28/2007   IBS 03/28/2007   Rosacea 03/28/2007   PLANTAR FASCIITIS 03/28/2007   MIGRAINES, HX  OF 03/28/2007    Past Surgical History:  Procedure Laterality Date   ABDOMINAL EXPLORATION SURGERY     COLONOSCOPY  11/01   Diverticulosis; hemorrhoids   HAND SURGERY     left thumb   LASIK     bilateral    Prior to Admission medications   Medication Sig Start Date End Date Taking? Authorizing Provider  Acetaminophen (TYLENOL ARTHRITIS PAIN PO) Take 650 mg by mouth daily.    [provider]  azaTHIOprine (IMURAN) 50 MG tablet Take 2 tablets by mouth daily. Patient not taking: Reported on 03/24/2021 02/23/21   Bo Merino, MD  buPROPion (WELLBUTRIN XL) 150 MG 24 hr tablet Take 1 tablet (150 mg total) by mouth daily. 08/26/20   Tower, [name], MD  Chlorphen-Phenyleph-ASA (ALKA-SELTZER PLUS COLD PO) Take by mouth as needed.    [provider]  cyclobenzaprine (FLEXERIL) 10 MG tablet TAKE 1/2 TO 1 TABLET BY  MOUTH ONCE DAILY AND 1  TABLET AT BEDTIME 09/08/20   Tower, Roxton A, MD  Diclofenac Sodium 1.5 % SOLN Place 2 mLs onto the skin 4 (four) times daily. Patient  not taking: Reported on 03/24/2021 08/15/15   Tower, [name], MD  DULoxetine (CYMBALTA) 60 MG capsule Take 1 capsule (60 mg total) by mouth daily. 08/26/20   Tower, [name], MD  esomeprazole (NEXIUM) 20 MG capsule Take 1 capsule (20 mg total) by mouth daily. 08/26/20   Tower, [name], MD  famotidine (PEPCID) 40 MG tablet Take 1 tablet (40 mg total) by mouth daily. 08/26/20   Tower, [name], MD  ferrous sulfate 325 (65 FE) MG EC tablet Take 325 mg by mouth daily with breakfast.    [provider]  glucose blood test strip One Touch Ultra stripts blue-To check sugar once daily and as needed for DM2 250.00 03/17/12   Tower, [name], MD  GUAIFENESIN CR PO Take by mouth as needed.    [provider]  HYDROmorphone HCl (EXALGO) 12 MG T24A SR tablet Take 12 mg by mouth daily.    [provider]  hyoscyamine (LEVSIN SL) 0.125 MG SL tablet Take 1 tablet (0.125 mg total) by mouth every 4 (four) hours  as needed for cramping. 08/26/20   Tower, [name], MD  ketoconazole (NIZORAL) 2 % shampoo SHAMPOO WITH A SMALL AMOUNT AS DIRECTED THREE TIMES A WEEK SHAMPOO SCALP 3 DAYS A WEEK, LET SHAMPOO SIT FOR 10 MINUTES AND RINSE OFF 06/16/20   Moye, [place], MD  levothyroxine (SYNTHROID) 150 MCG tablet Take 1 tablet (150 mcg total) by mouth daily. 08/26/20   Tower, [name], MD  LYSINE PO Take by mouth daily.    [provider]  Magnesium 400 MG CAPS Take by mouth daily.    [provider]  metFORMIN (GLUCOPHAGE) 500 MG tablet TAKE 1 TABLET BY MOUTH  TWICE DAILY WITH A MEAL 08/26/20   Tower, [place] A, MD  metoprolol succinate (TOPROL-XL) 50 MG 24 hr tablet TAKE 1 TABLET BY MOUTH  TWICE DAILY TAKE WITH OR  IMMEDIATELY FOLLOWING A  MEAL 08/26/20   Tower, [name], MD  ondansetron (ZOFRAN) 4 MG tablet Take 1 tablet (4 mg total) by mouth every 8 (eight) hours as needed for nausea or vomiting. 03/13/21   Ofilia Neas, PA-C  oxyCODONE (OXYCONTIN) 10 MG 12 hr tablet Take 10 mg by mouth 3 (three) times daily as needed.     [provider]  rosuvastatin (CRESTOR) 5 MG tablet TAKE 1 TABLET BY MOUTH  TWICE WEEKLY 08/26/20   Tower, [name], MD  torsemide (DEMADEX) 20 MG tablet TAKE 1 TABLET BY MOUTH  DAILY AS NEEDED 01/26/21   Tower, [name], MD    Allergies Keflex [cephalexin], Penicillins, Amitriptyline hcl, Atorvastatin, Benicar [olmesartan medoxomil], Ceftin [cefuroxime], Cetirizine hcl, Ciprofloxacin, Clonidine derivatives, Diovan [valsartan], Erythromycin, Etodolac, Fluoxetine hcl, Furosemide, Gabapentin, Naproxen sodium, Paroxetine, Pregabalin, Sulfonamide derivatives, Tetracycline, Venlafaxine, and Cephalexin  Family History  Problem Relation Age of Onset   Emphysema Father        + smoker   Lung cancer Mother        + smoker   Coronary artery disease Mother        relatively young   Lymphoma Sister    Heart disease Brother    Lymphoma Brother    Parkinson's disease Brother    Anemia  Brother        aplastic    Diabetes Brother    Leukemia Son    Iron deficiency Daughter    Breast cancer Neg Hx     Social History Social History   Tobacco Use  Smoking status: Former    Pack years: 0.00    Types: Cigarettes   Smokeless tobacco: Never   Tobacco comments:    quit over 40 years  Vaping Use   Vaping Use: Never used  Substance Use Topics   Alcohol use: No    Alcohol/week: 0.0 standard drinks   Drug use: No     Review of Systems  Constitutional: No fever/chills Eyes: No visual changes. No discharge ENT: No upper respiratory complaints. Cardiovascular: no chest pain. Respiratory: no cough. No SOB. Gastrointestinal: No abdominal pain.  No nausea, no vomiting.  No diarrhea.  No constipation. Musculoskeletal: Negative for musculoskeletal pain. Skin: Bleeding lesion to the anterior left ankle Neurological: Negative for headaches, focal weakness or numbness.  10 System ROS otherwise negative.  ____________________________________________   PHYSICAL EXAM:  VITAL SIGNS: ED Triage Vitals [03/29/21 1904]  Enc Vitals Group     BP 127/61     Pulse Rate 85     Resp 18     Temp 98.8 F (37.1 C)     Temp Source Oral     SpO2 96 %     Weight 200 lb (90.7 kg)     Height      Head Circumference      Peak Flow      Pain Score      Pain Loc      Pain Edu?      Excl. in [place]?      Constitutional: Alert and oriented. Well appearing and in no acute distress. Eyes: Conjunctivae are normal. PERRL. EOMI. Head: Atraumatic. ENT:      Ears:       Nose: No congestion/rhinnorhea.      Mouth/Throat: Mucous membranes are moist.  Neck: No stridor.    Cardiovascular: Normal rate, regular rhythm. Normal S1 and S2.  Good peripheral circulation. Respiratory: Normal respiratory effort without tachypnea or retractions. Lungs CTAB. Good air entry to the bases with no decreased or absent breath sounds. Musculoskeletal: Full range of motion to all extremities. No gross  deformities appreciated.  Visualization of the left lower leg reveals what appears to be an angioma that is actively bleeding as soon as dressing is removed.  This is slightly pulsatile but does appear to be venous bleeding.  There is no surrounding soft tissue injuries. Neurologic:  Normal speech and language. No gross focal neurologic deficits are appreciated.  Skin:  Skin is warm, dry and intact. No rash noted. Psychiatric: Mood and affect are normal. Speech and behavior are normal. Patient exhibits appropriate insight and judgement.   ____________________________________________   LABS (all labs ordered are listed, but only abnormal results are displayed)  Labs Reviewed - No data to display ____________________________________________  EKG   ____________________________________________  RADIOLOGY   No results found.  ____________________________________________    PROCEDURES  Procedure(s) performed:    Marland KitchenMarland KitchenLaceration Repair  Date/Time: 03/29/2021 11:27 PM Performed by: Darletta Moll, PA-C Authorized by: Darletta Moll, PA-C   Consent:    Consent obtained:  Verbal   Consent given by:  Patient   Risks, benefits, and alternatives were discussed: yes     Risks discussed:  Infection, pain and need for additional repair Universal protocol:    Procedure explained and questions answered to patient or proxy's satisfaction: yes     Immediately prior to procedure, a time out was called: yes     Patient identity confirmed:  Verbally with patient Anesthesia:    Anesthesia method:  Local  infiltration   Local anesthetic:  Lidocaine 1% WITH epi Laceration details:    Location:  Leg   Leg location:  L lower leg Exploration:    Hemostasis obtained with: lidocaine with epi and TXA.   Contaminated: no   Treatment:    Area cleansed with:  Povidone-iodine and saline   Amount of cleaning:  Standard   Irrigation solution:  Sterile saline Skin repair:    Repair  method:  Tissue adhesive Repair type:    Repair type:  Simple Post-procedure details:    Dressing:  Bulky dressing   Procedure completion:  Tolerated well, no immediate complications Comments:     Patient had bleeding hemangioma noted to the left lower leg.  This was infiltrated with lidocaine with epi, TXA applied topically.  Patient had good control bleeding and areas covered with Dermabond and bulky dressing applied over top.  Wound care instructions are discussed with the patient and her husband.    Medications  tranexamic acid (CYKLOKAPRON) 1000 MG/10ML topical solution 1,000 mg (1,000 mg Topical Given 03/29/21 2014)  lidocaine-EPINEPHrine (XYLOCAINE-EPINEPHrine) 1 %-1:200000 (PF) injection 10 mL (10 mLs Infiltration Given by Other 03/29/21 2025)     ____________________________________________   INITIAL IMPRESSION / ASSESSMENT AND PLAN / ED COURSE  Pertinent labs & imaging results that were available during my care of the patient were reviewed by me and considered in my medical decision making (see chart for details).  Review of the [place] CSRS was performed in accordance of the [place] prior to dispensing any controlled drugs.           Patient's diagnosis is consistent with hemangioma/pyogenic granuloma of the left leg with bleeding.  Patient presented to the emergency department after having a bleeding lesion to the left leg.  Patient had what appears to be a pyogenic granuloma that opens and started bleeding.  TXA was applied topically, area was infiltrated with lidocaine with epi with good control bleeding.  Areas covered Dermabond bulky dressing applied.  Wound care instructions discussed with the patient.  Follow-up with either her dermatologist or vascular surgery for further management.  Patient will return to the emergency department for any resumption of bleeding..  Patient is given ED precautions to return to the ED for any worsening or new  symptoms.     ____________________________________________  FINAL CLINICAL IMPRESSION(S) / ED DIAGNOSES  Final diagnoses:  Hemangioma of skin  Hemorrhage from wound      NEW MEDICATIONS STARTED DURING THIS VISIT:  ED Discharge Orders     None           This chart was dictated using voice recognition software/Dragon. Despite best efforts to proofread, errors can occur which can change the meaning. Any change was purely unintentional.    Brynda Peon 03/29/21 2330    Carrie Mew, MD 03/29/21 2357

## 2021-03-29 NOTE — ED Triage Notes (Signed)
Pt in from home via [facility] w/uncontrolled bleeding to spot that had fallen off of anterior part of ankle. Pt states this spot was eraser-sized, and been noticed for a few years. It fell off randomly today, and EMS reports 373ml's of estimated blood loss. No dizziness reported, BP 146/60 en route

## 2021-03-29 NOTE — ED Notes (Signed)
Gauze soaked with tranexamic acid applied to left lower extremity and pressure dressing applied. Will reevaluate.

## 2021-03-30 ENCOUNTER — Telehealth: Payer: Self-pay

## 2021-03-30 NOTE — Chronic Care Management (AMB) (Addendum)
Chronic Care Management Pharmacy Assistant   Name: Tina Berger  MRN: [id number] DOB: December 06, 1950  Reason for Encounter: Initial Questions   Conditions to be addressed/monitored: CAD, HTN, HLD, and DMII  Recent office visits:  11/13/20 - Dr.Tower PCP labs ordered Advise use of voltaren gel 10/23/20 - Dr.Bedsole Start prednisone taper x  10 days.  Stop ibuprofen.  Recent consult visits:  03/24/21 - Rheumatology High risk medication use -(Imuran 100mg  by mouth daily-started January 22, 2021 and discontinued March 19, 2021 due to nausea, vomiting and fatigue. 02/20/21 - Rheumatology She is currently on prednisone 5 mg p.o. daily.  She will taper it to 2.5 mg p.o. daily and then will discontinue prednisone.   01/21/21 - Rheumatology Labs ordered She will start on Imuran 50 mg 1 tablet by mouth daily for 2 weeks and if labs are stable she will increase to 100 mg daily.   01/13/21 - Rheumatology labs ordered started prednisone 5mg  take 2 tablets In am X 7 days then 1.5 tablets for 7 days then 1 tablet for 7 days then 1/2 tablet for 7 days 12/31/20 - Rheumatology labs ordered no medication changes  10/02/20 - PA-C  no data found  Hospital visits:  03/29/21 [facility] ED    Medications: Outpatient Encounter Medications as of 03/30/2021  Medication Sig   Acetaminophen (TYLENOL ARTHRITIS PAIN PO) Take 650 mg by mouth daily.   azaTHIOprine (IMURAN) 50 MG tablet Take 2 tablets by mouth daily. (Patient not taking: Reported on 03/24/2021)   buPROPion (WELLBUTRIN XL) 150 MG 24 hr tablet Take 1 tablet (150 mg total) by mouth daily.   Chlorphen-Phenyleph-ASA (ALKA-SELTZER PLUS COLD PO) Take by mouth as needed.   cyclobenzaprine (FLEXERIL) 10 MG tablet TAKE 1/2 TO 1 TABLET BY  MOUTH ONCE DAILY AND 1  TABLET AT BEDTIME   Diclofenac Sodium 1.5 % SOLN Place 2 mLs onto the skin 4 (four) times daily. (Patient not taking: Reported on 03/24/2021)   DULoxetine (CYMBALTA) 60 MG capsule Take 1 capsule (60 mg total) by mouth  daily.   esomeprazole (NEXIUM) 20 MG capsule Take 1 capsule (20 mg total) by mouth daily.   famotidine (PEPCID) 40 MG tablet Take 1 tablet (40 mg total) by mouth daily.   ferrous sulfate 325 (65 FE) MG EC tablet Take 325 mg by mouth daily with breakfast.   glucose blood test strip One Touch Ultra stripts blue-To check sugar once daily and as needed for DM2 250.00   GUAIFENESIN CR PO Take by mouth as needed.   HYDROmorphone HCl (EXALGO) 12 MG T24A SR tablet Take 12 mg by mouth daily.   hyoscyamine (LEVSIN SL) 0.125 MG SL tablet Take 1 tablet (0.125 mg total) by mouth every 4 (four) hours as needed for cramping.   ketoconazole (NIZORAL) 2 % shampoo SHAMPOO WITH A SMALL AMOUNT AS DIRECTED THREE TIMES A WEEK SHAMPOO SCALP 3 DAYS A WEEK, LET SHAMPOO SIT FOR 10 MINUTES AND RINSE OFF   levothyroxine (SYNTHROID) 150 MCG tablet Take 1 tablet (150 mcg total) by mouth daily.   LYSINE PO Take by mouth daily.   Magnesium 400 MG CAPS Take by mouth daily.   metFORMIN (GLUCOPHAGE) 500 MG tablet TAKE 1 TABLET BY MOUTH  TWICE DAILY WITH A MEAL   metoprolol succinate (TOPROL-XL) 50 MG 24 hr tablet TAKE 1 TABLET BY MOUTH  TWICE DAILY TAKE WITH OR  IMMEDIATELY FOLLOWING A  MEAL   ondansetron (ZOFRAN) 4 MG tablet Take 1 tablet (4  mg total) by mouth every 8 (eight) hours as needed for nausea or vomiting.   oxyCODONE (OXYCONTIN) 10 MG 12 hr tablet Take 10 mg by mouth 3 (three) times daily as needed.    rosuvastatin (CRESTOR) 5 MG tablet TAKE 1 TABLET BY MOUTH  TWICE WEEKLY   torsemide (DEMADEX) 20 MG tablet TAKE 1 TABLET BY MOUTH  DAILY AS NEEDED   No facility-administered encounter medications on file as of 03/30/2021.    Lab Results  Component Value Date/Time   HGBA1C 6.5 08/20/2020 09:51 AM   HGBA1C 6.5 08/17/2019 09:32 AM     BP Readings from Last 3 Encounters:  03/29/21 117/66  03/24/21 112/74  02/20/21 113/74   Have you seen any other providers since your last visit with PCP? Yes - Rheumatology  Any  changes in your medications or health? No  Any side effects from any medications? No  Do you have an symptoms or problems not managed by your medications? No  Any concerns about your health right now? Yes the patient has questions about nsaids also the patient will see her pulmonologist soon .  Has your provider asked that you check blood pressure, blood sugar, or follow special diet at home? Yes the patient has a low carb diet and does have monitors to check her BG's and BP's  Do you get any type of exercise on a regular basis? Yes the patient reports she has exercise glider at home and does pilates   Can you think of a goal you would like to reach for your health? No  Do you have any problems getting your medications? No  Is there anything that you would like to discuss during the appointment? No  Tina Berger was reminded to have all medications, supplements and any blood glucose and blood pressure readings available for review with [name], Pharm. D, at her telephone visit on 04/07/2021 at 2:00pm   Star Rating Drugs:  Medication:  Last Fill: Day Supply Metformin 500mg  08/29/20 90 Rosuvastatin 5mg  10/23/20  90    Follow-Up:  Pharmacist Review  [name], CPP notified  Avel Sensor [place] Assistant [phone]  I have reviewed the care management and care coordination activities outlined in this encounter and I am certifying that I agree with the content of this note. No further action required.  [name], PharmD Clinical Pharmacist [facility] [phone]

## 2021-04-02 ENCOUNTER — Other Ambulatory Visit: Payer: Self-pay

## 2021-04-02 ENCOUNTER — Ambulatory Visit (INDEPENDENT_AMBULATORY_CARE_PROVIDER_SITE_OTHER): Payer: Medicare Other | Admitting: Vascular Surgery

## 2021-04-02 ENCOUNTER — Encounter (INDEPENDENT_AMBULATORY_CARE_PROVIDER_SITE_OTHER): Payer: Self-pay | Admitting: Vascular Surgery

## 2021-04-02 VITALS — BP 122/79 | HR 94 | Resp 16 | Ht 62.0 in | Wt 197.8 lb

## 2021-04-02 DIAGNOSIS — Q2730 Arteriovenous malformation, site unspecified: Secondary | ICD-10-CM

## 2021-04-02 DIAGNOSIS — I2510 Atherosclerotic heart disease of native coronary artery without angina pectoris: Secondary | ICD-10-CM

## 2021-04-02 DIAGNOSIS — I10 Essential (primary) hypertension: Secondary | ICD-10-CM | POA: Diagnosis not present

## 2021-04-02 DIAGNOSIS — E119 Type 2 diabetes mellitus without complications: Secondary | ICD-10-CM | POA: Diagnosis not present

## 2021-04-03 ENCOUNTER — Other Ambulatory Visit (INDEPENDENT_AMBULATORY_CARE_PROVIDER_SITE_OTHER): Payer: Self-pay | Admitting: Vascular Surgery

## 2021-04-03 DIAGNOSIS — I781 Nevus, non-neoplastic: Secondary | ICD-10-CM

## 2021-04-03 DIAGNOSIS — Q2730 Arteriovenous malformation, site unspecified: Secondary | ICD-10-CM

## 2021-04-04 ENCOUNTER — Encounter (INDEPENDENT_AMBULATORY_CARE_PROVIDER_SITE_OTHER): Payer: Self-pay | Admitting: Vascular Surgery

## 2021-04-04 DIAGNOSIS — Q2730 Arteriovenous malformation, site unspecified: Secondary | ICD-10-CM | POA: Insufficient documentation

## 2021-04-04 NOTE — Progress Notes (Signed)
MRN : [id number]  Tina Berger is a 70 y.o. (Jun 07, 1951) female who presents with chief complaint of  Chief Complaint  Patient presents with   New Patient (Initial Visit)    Ref Cuthriell dx lle hemangioma/pyogenic granuloma with bleeding  .  History of Present Illness:   Chief complaint: area of the left ankle that bleed  Location: left ankle Character/quality of the symptom:  severe bleeding called 911 and was seen in in ER Severity:  very Duration:  one occurrence  Timing/onset:  acute Aggravating/context:  none Relieving/modifying:  treated with lidocaine with epi injection and Dermabond   Current Meds  Medication Sig   Acetaminophen (TYLENOL ARTHRITIS PAIN PO) Take 650 mg by mouth daily.   buPROPion (WELLBUTRIN XL) 150 MG 24 hr tablet Take 1 tablet (150 mg total) by mouth daily.   Chlorphen-Phenyleph-ASA (ALKA-SELTZER PLUS COLD PO) Take by mouth as needed.   cyclobenzaprine (FLEXERIL) 10 MG tablet TAKE 1/2 TO 1 TABLET BY  MOUTH ONCE DAILY AND 1  TABLET AT BEDTIME   DULoxetine (CYMBALTA) 60 MG capsule Take 1 capsule (60 mg total) by mouth daily.   esomeprazole (NEXIUM) 20 MG capsule Take 1 capsule (20 mg total) by mouth daily.   famotidine (PEPCID) 40 MG tablet Take 1 tablet (40 mg total) by mouth daily.   ferrous sulfate 325 (65 FE) MG EC tablet Take 325 mg by mouth daily with breakfast.   glucose blood test strip One Touch Ultra stripts blue-To check sugar once daily and as needed for DM2 250.00   GUAIFENESIN CR PO Take by mouth as needed.   HYDROmorphone HCl (EXALGO) 12 MG T24A SR tablet Take 12 mg by mouth daily.   hyoscyamine (LEVSIN SL) 0.125 MG SL tablet Take 1 tablet (0.125 mg total) by mouth every 4 (four) hours as needed for cramping.   ketoconazole (NIZORAL) 2 % shampoo SHAMPOO WITH A SMALL AMOUNT AS DIRECTED THREE TIMES A WEEK SHAMPOO SCALP 3 DAYS A WEEK, LET SHAMPOO SIT FOR 10 MINUTES AND RINSE OFF   levothyroxine (SYNTHROID) 150 MCG tablet Take 1 tablet  (150 mcg total) by mouth daily.   LYSINE PO Take by mouth daily.   Magnesium 400 MG CAPS Take by mouth daily.   metFORMIN (GLUCOPHAGE) 500 MG tablet TAKE 1 TABLET BY MOUTH  TWICE DAILY WITH A MEAL   metoprolol succinate (TOPROL-XL) 50 MG 24 hr tablet TAKE 1 TABLET BY MOUTH  TWICE DAILY TAKE WITH OR  IMMEDIATELY FOLLOWING A  MEAL   ondansetron (ZOFRAN) 4 MG tablet Take 1 tablet (4 mg total) by mouth every 8 (eight) hours as needed for nausea or vomiting.   oxyCODONE (OXYCONTIN) 10 MG 12 hr tablet Take 10 mg by mouth 3 (three) times daily as needed.    rosuvastatin (CRESTOR) 5 MG tablet TAKE 1 TABLET BY MOUTH  TWICE WEEKLY   torsemide (DEMADEX) 20 MG tablet TAKE 1 TABLET BY MOUTH  DAILY AS NEEDED    Past Medical History:  Diagnosis Date   Acute renal insufficiency 05/31/2014   Anemia, iron deficiency    Bilateral lower extremity edema    a. uses torsemide   Cervical dysplasia    abnormal paps   Colon cancer screening 06/14/2014   Cough 08/08/2012   Degenerative disk disease 11/19/2011   Diabetes mellitus type II    Diet controlled   Diverticulosis    DJD (degenerative joint disease)    Drug rash 05/22/2011   Dry eyes  Edema    Elevated liver enzymes 03/21/2012   Encounter for routine gynecological examination 06/14/2014   ESOPHAGITIS 03/28/2007   Qualifier: Hospitalized for  By: Marcelino Scot CMA, [place] Bilberry     Fibromyalgia    GASTRITIS 03/28/2007   Qualifier: History of  By: Marcelino Scot CMA, [place] Bilberry     GERD (gastroesophageal reflux disease)    Hemorrhoids    external   HLD (hyperlipidemia)    HNP (herniated nucleus pulposus) 2/99   T6,7,8 with DJD   HTN (hypertension)    Hyperglycemia 05/13/2008   Qualifier: Diagnosis of  By: Glori Bickers MD, Carmell [place]    Hypothyroidism    Interstitial lung disease ([place])    ?   Left ovarian cyst    x 3, rupture   Osteoarthritis    hands   Osteopenia    mild-11/01; improved 12/05   Other screening mammogram 08/18/2011   Palpitations    PERSONAL  HISTORY ALLERGY UNSPEC MEDICINAL AGENT 03/18/2010   Qualifier: Diagnosis of  By: Glori Bickers MD, Carmell [place]    Raynaud's disease    Recurrent HSV (herpes simplex virus)    lesions in nose   Rhinitis 03/21/2012   Routine general medical examination at a health care facility 03/28/2011    Past Surgical History:  Procedure Laterality Date   ABDOMINAL EXPLORATION SURGERY     COLONOSCOPY  11/01   Diverticulosis; hemorrhoids   HAND SURGERY     left thumb   LASIK     bilateral    Social History Social History   Tobacco Use   Smoking status: Former    Pack years: 0.00    Types: Cigarettes   Smokeless tobacco: Never   Tobacco comments:    quit over 40 years  Vaping Use   Vaping Use: Never used  Substance Use Topics   Alcohol use: No    Alcohol/week: 0.0 standard drinks   Drug use: No    Family History Family History  Problem Relation Age of Onset   Emphysema Father        + smoker   Lung cancer Mother        + smoker   Coronary artery disease Mother        relatively young   Lymphoma Sister    Heart disease Brother    Lymphoma Brother    Parkinson's disease Brother    Anemia Brother        aplastic    Diabetes Brother    Leukemia Son    Iron deficiency Daughter    Breast cancer Neg Hx   No family history of bleeding/clotting disorders, porphyria or autoimmune disease   Allergies  Allergen Reactions   Keflex [Cephalexin] Anaphylaxis   Penicillins Anaphylaxis   Amitriptyline Hcl     REACTION: sedating   Atorvastatin     REACTION: muscle   Benicar [Olmesartan Medoxomil]     Muscle pain    Ceftin [Cefuroxime]     Swelling, "legs turn blue"   Cetirizine Hcl     REACTION: headache   Ciprofloxacin     REACTION: ? rash vs sun rxn   Clonidine Derivatives     Swelling    Diovan [Valsartan]     Thought it made her feel confused   Erythromycin     Rash, swollen gums    Etodolac     REACTION: reaction not known   Fluoxetine Hcl     REACTION: stomach problems    Furosemide  REACTION: swelling   Gabapentin     REACTION: edema   Naproxen Sodium     REACTION: edema   Paroxetine     REACTION: weight gain   Pregabalin     REACTION: swelling   Sulfonamide Derivatives     Rash, swollen gums, lips   Tetracycline     REACTION:inflammed genitals   Venlafaxine     REACTION: sweating   Cephalexin Hives and Rash     REVIEW OF SYSTEMS (Negative unless checked)  Constitutional: [] Weight loss  [] Fever  [] Chills Cardiac: [] Chest pain   [] Chest pressure   [] Palpitations   [] Shortness of breath when laying flat   [] Shortness of breath with exertion. Vascular:  [] Pain in legs with walking   [] Pain in legs at rest  [] History of DVT   [] Phlebitis   [x] Swelling in legs   [x] Varicose veins   [] Non-healing ulcers Pulmonary:   [] Uses home oxygen   [] Productive cough   [] Hemoptysis   [] Wheeze  [] COPD   [] Asthma Neurologic:  [] Dizziness   [] Seizures   [] History of stroke   [] History of TIA  [] Aphasia   [] Vissual changes   [] Weakness or numbness in arm   [] Weakness or numbness in leg Musculoskeletal:   [] Joint swelling   [] Joint pain   [] Low back pain Hematologic:  [] Easy bruising  [] Easy bleeding   [] Hypercoagulable state   [] Anemic Gastrointestinal:  [] Diarrhea   [] Vomiting  [] Gastroesophageal reflux/heartburn   [] Difficulty swallowing. Genitourinary:  [] Chronic kidney disease   [] Difficult urination  [] Frequent urination   [] Blood in urine Skin:  [] Rashes   [] Ulcers  Psychological:  [] History of anxiety   []  History of major depression.  Physical Examination  Vitals:   04/02/21 1539  BP: 122/79  Pulse: 94  Resp: 16  Weight: 197 lb 12.8 oz (89.7 kg)  Height: 5\' 2"  (1.575 m)   Body mass index is 36.18 kg/m. Gen: WD/WN, NAD Head: [place]/AT, No temporalis wasting.  Ear/Nose/Throat: Hearing grossly intact, nares w/o erythema or drainage, poor dentition Eyes: PER, EOMI, sclera nonicteric.  Neck: Supple, no masses.  No bruit or JVD.  Pulmonary:  Good air  movement, clear to auscultation bilaterally, no use of accessory muscles.  Cardiac: RRR, normal S1, S2, no Murmurs. Vascular:  small lesion left ankle non-bleeding at this time Vessel Right Left  Radial Palpable Palpable  PT Palpable Palpable  DP Palpable Palpable  Gastrointestinal: soft, non-distended. No guarding/no peritoneal signs.  Musculoskeletal: M/S 5/5 throughout.  No deformity or atrophy.  Neurologic: CN 2-12 intact. Pain and light touch intact in extremities.  Symmetrical.  Speech is fluent. Motor exam as listed above. Psychiatric: Judgment intact, Mood & affect appropriate for pt's clinical situation. Dermatologic: Raynaud's changed no ulcers noted.  No changes consistent with cellulitis. Lymph : No lichenification or skin changes of chronic lymphedema.  CBC Lab Results  Component Value Date   WBC 11.0 (H) 03/24/2021   HGB 13.1 03/24/2021   HCT 40.4 03/24/2021   MCV 90.2 03/24/2021   PLT 505 (H) 03/24/2021    BMET    Component Value Date/Time   NA 138 03/24/2021 0930   NA 141 02/06/2021 1109   NA 135 (L) 03/06/2012 0546   K 4.6 03/24/2021 0930   K 4.9 03/06/2012 0546   CL 99 03/24/2021 0930   CL 99 03/06/2012 0546   CO2 30 03/24/2021 0930   CO2 28 03/06/2012 0546   GLUCOSE 96 03/24/2021 0930   GLUCOSE 138 (H) 03/06/2012 0546  BUN 24 03/24/2021 0930   BUN 17 02/06/2021 1109   BUN 21 (H) 03/06/2012 0546   CREATININE 0.90 03/24/2021 0930   CALCIUM 9.4 03/24/2021 0930   CALCIUM 8.4 (L) 03/06/2012 0546   GFRNONAA 64 03/24/2021 0930   GFRAA 75 03/24/2021 0930   Estimated Creatinine Clearance: 59.6 mL/min (by C-G formula based on SCr of 0.9 mg/dL).  COAG No results found for: INR, PROTIME  Radiology No results found.   Assessment/Plan 1. AVM (arteriovenous malformation) Recommend:  The patient has an AV malformation that has bled.  I will obtain a Duplex ultrasound to see if a feeder vein can be identified.    Patient should undergo injection  sclerotherapy to treat the any feeding veins.  The risks, benefits and alternative therapies were reviewed in detail with the patient.  All questions were answered.  The patient agrees to proceed with sclerotherapy at their convenience.  The patient will continue wearing the graduated compression stockings and using the over-the-counter pain medications to treat her symptoms.       2. Coronary artery disease involving native coronary artery of native heart without angina pectoris Continue cardiac and antihypertensive medications as already ordered and reviewed, no changes at this time.  Continue statin as ordered and reviewed, no changes at this time  Nitrates PRN for chest pain   3. Primary hypertension Continue antihypertensive medications as already ordered, these medications have been reviewed and there are no changes at this time.   4. Type 2 diabetes mellitus without complication, without long-term current use of insulin (HCC) Continue hypoglycemic medications as already ordered, these medications have been reviewed and there are no changes at this time.  Hgb A1C to be monitored as already arranged by primary service     Hortencia Pilar, MD  04/04/2021 11:07 AM

## 2021-04-06 ENCOUNTER — Ambulatory Visit (INDEPENDENT_AMBULATORY_CARE_PROVIDER_SITE_OTHER): Payer: Medicare Other

## 2021-04-06 ENCOUNTER — Other Ambulatory Visit (INDEPENDENT_AMBULATORY_CARE_PROVIDER_SITE_OTHER): Payer: Self-pay | Admitting: Nurse Practitioner

## 2021-04-06 ENCOUNTER — Telehealth (INDEPENDENT_AMBULATORY_CARE_PROVIDER_SITE_OTHER): Payer: Self-pay

## 2021-04-06 ENCOUNTER — Ambulatory Visit (INDEPENDENT_AMBULATORY_CARE_PROVIDER_SITE_OTHER): Payer: Medicare Other | Admitting: Vascular Surgery

## 2021-04-06 ENCOUNTER — Encounter (INDEPENDENT_AMBULATORY_CARE_PROVIDER_SITE_OTHER): Payer: Self-pay | Admitting: Vascular Surgery

## 2021-04-06 ENCOUNTER — Other Ambulatory Visit: Payer: Self-pay

## 2021-04-06 ENCOUNTER — Encounter
Admission: RE | Admit: 2021-04-06 | Discharge: 2021-04-06 | Disposition: A | Discharge status: Home / Self Care | Payer: Medicare Other | Source: Ambulatory Visit | Attending: Vascular Surgery | Admitting: Vascular Surgery

## 2021-04-06 VITALS — BP 101/66 | HR 86 | Resp 16 | Wt 200.0 lb

## 2021-04-06 DIAGNOSIS — Q2730 Arteriovenous malformation, site unspecified: Secondary | ICD-10-CM

## 2021-04-06 DIAGNOSIS — I781 Nevus, non-neoplastic: Secondary | ICD-10-CM

## 2021-04-06 DIAGNOSIS — I2510 Atherosclerotic heart disease of native coronary artery without angina pectoris: Secondary | ICD-10-CM

## 2021-04-06 DIAGNOSIS — I10 Essential (primary) hypertension: Secondary | ICD-10-CM

## 2021-04-06 DIAGNOSIS — E119 Type 2 diabetes mellitus without complications: Secondary | ICD-10-CM | POA: Diagnosis not present

## 2021-04-06 HISTORY — DX: Rheumatoid arthritis, unspecified: M06.9

## 2021-04-06 HISTORY — DX: Cardiac arrhythmia, unspecified: I49.9

## 2021-04-06 HISTORY — DX: Anxiety disorder, unspecified: F41.9

## 2021-04-06 HISTORY — DX: Depression, unspecified: F32.A

## 2021-04-06 HISTORY — DX: Atherosclerotic heart disease of native coronary artery without angina pectoris: I25.10

## 2021-04-06 HISTORY — DX: Other specified postprocedural states: Z98.890

## 2021-04-06 HISTORY — DX: Nausea with vomiting, unspecified: R11.2

## 2021-04-06 NOTE — H&P (View-Only) (Signed)
MRN : [id number]  Tina Berger is a 70 y.o. (1951/03/22) female who presents with chief complaint of  Chief Complaint  Patient presents with   Follow-up    Ultrasound follow up  .  History of Present Illness:   Patient returns for follow up of her AV malformation s/p hemorrhage.  No further episodes of bleeding.  No new pain.  Current Meds  Medication Sig   Acetaminophen (TYLENOL ARTHRITIS PAIN PO) Take 650 mg by mouth daily.   buPROPion (WELLBUTRIN XL) 150 MG 24 hr tablet Take 1 tablet (150 mg total) by mouth daily.   Chlorphen-Phenyleph-ASA (ALKA-SELTZER PLUS COLD PO) Take by mouth as needed.   cyclobenzaprine (FLEXERIL) 10 MG tablet TAKE 1/2 TO 1 TABLET BY  MOUTH ONCE DAILY AND 1  TABLET AT BEDTIME   DULoxetine (CYMBALTA) 60 MG capsule Take 1 capsule (60 mg total) by mouth daily.   esomeprazole (NEXIUM) 20 MG capsule Take 1 capsule (20 mg total) by mouth daily.   famotidine (PEPCID) 40 MG tablet Take 1 tablet (40 mg total) by mouth daily.   ferrous sulfate 325 (65 FE) MG EC tablet Take 325 mg by mouth daily with breakfast.   glucose blood test strip One Touch Ultra stripts blue-To check sugar once daily and as needed for DM2 250.00   GUAIFENESIN CR PO Take by mouth as needed.   HYDROmorphone HCl (EXALGO) 12 MG T24A SR tablet Take 12 mg by mouth daily.   hyoscyamine (LEVSIN SL) 0.125 MG SL tablet Take 1 tablet (0.125 mg total) by mouth every 4 (four) hours as needed for cramping.   ketoconazole (NIZORAL) 2 % shampoo SHAMPOO WITH A SMALL AMOUNT AS DIRECTED THREE TIMES A WEEK SHAMPOO SCALP 3 DAYS A WEEK, LET SHAMPOO SIT FOR 10 MINUTES AND RINSE OFF   levothyroxine (SYNTHROID) 150 MCG tablet Take 1 tablet (150 mcg total) by mouth daily.   LYSINE PO Take by mouth daily.   Magnesium 400 MG CAPS Take by mouth daily.   metFORMIN (GLUCOPHAGE) 500 MG tablet TAKE 1 TABLET BY MOUTH  TWICE DAILY WITH A MEAL   metoprolol succinate (TOPROL-XL) 50 MG 24 hr tablet TAKE 1 TABLET BY MOUTH   TWICE DAILY TAKE WITH OR  IMMEDIATELY FOLLOWING A  MEAL   ondansetron (ZOFRAN) 4 MG tablet Take 1 tablet (4 mg total) by mouth every 8 (eight) hours as needed for nausea or vomiting.   oxyCODONE (OXYCONTIN) 10 MG 12 hr tablet Take 10 mg by mouth 3 (three) times daily as needed.    rosuvastatin (CRESTOR) 5 MG tablet TAKE 1 TABLET BY MOUTH  TWICE WEEKLY   torsemide (DEMADEX) 20 MG tablet TAKE 1 TABLET BY MOUTH  DAILY AS NEEDED    Past Medical History:  Diagnosis Date   Acute renal insufficiency 05/31/2014   Anemia, iron deficiency    Bilateral lower extremity edema    a. uses torsemide   Cervical dysplasia    abnormal paps   Colon cancer screening 06/14/2014   Cough 08/08/2012   Degenerative disk disease 11/19/2011   Diabetes mellitus type II    Diet controlled   Diverticulosis    DJD (degenerative joint disease)    Drug rash 05/22/2011   Dry eyes    Edema    Elevated liver enzymes 03/21/2012   Encounter for routine gynecological examination 06/14/2014   ESOPHAGITIS 03/28/2007   Qualifier: Hospitalized for  By: Marcelino Scot CMA, [place] Bil[place]     Fibromyalgia    GASTRITIS  03/28/2007   Qualifier: History of  By: Marcelino Scot CMA, [place] Bilberry     GERD (gastroesophageal reflux disease)    Hemorrhoids    external   HLD (hyperlipidemia)    HNP (herniated nucleus pulposus) 2/99   T6,7,8 with DJD   HTN (hypertension)    Hyperglycemia 05/13/2008   Qualifier: Diagnosis of  By: Glori Bickers MD, Carmell [place]    Hypothyroidism    Interstitial lung disease ([place])    ?   Left ovarian cyst    x 3, rupture   Osteoarthritis    hands   Osteopenia    mild-11/01; improved 12/05   Other screening mammogram 08/18/2011   Palpitations    PERSONAL HISTORY ALLERGY UNSPEC MEDICINAL AGENT 03/18/2010   Qualifier: Diagnosis of  By: Glori Bickers MD, Carmell [place]    Raynaud's disease    Recurrent HSV (herpes simplex virus)    lesions in nose   Rhinitis 03/21/2012   Routine general medical examination at a health care facility 03/28/2011     Past Surgical History:  Procedure Laterality Date   ABDOMINAL EXPLORATION SURGERY     COLONOSCOPY  11/01   Diverticulosis; hemorrhoids   HAND SURGERY     left thumb   LASIK     bilateral    Social History Social History   Tobacco Use   Smoking status: Former    Pack years: 0.00    Types: Cigarettes   Smokeless tobacco: Never   Tobacco comments:    quit over 40 years  Vaping Use   Vaping Use: Never used  Substance Use Topics   Alcohol use: No    Alcohol/week: 0.0 standard drinks   Drug use: No    Family History Family History  Problem Relation Age of Onset   Emphysema Father        + smoker   Lung cancer Mother        + smoker   Coronary artery disease Mother        relatively young   Lymphoma Sister    Heart disease Brother    Lymphoma Brother    Parkinson's disease Brother    Anemia Brother        aplastic    Diabetes Brother    Leukemia Son    Iron deficiency Daughter    Breast cancer Neg Hx     Allergies  Allergen Reactions   Keflex [Cephalexin] Anaphylaxis   Penicillins Anaphylaxis   Amitriptyline Hcl     REACTION: sedating   Atorvastatin     REACTION: muscle   Benicar [Olmesartan Medoxomil]     Muscle pain    Ceftin [Cefuroxime]     Swelling, "legs turn blue"   Cetirizine Hcl     REACTION: headache   Ciprofloxacin     REACTION: ? rash vs sun rxn   Clonidine Derivatives     Swelling    Diovan [Valsartan]     Thought it made her feel confused   Erythromycin     Rash, swollen gums    Etodolac     REACTION: reaction not known   Fluoxetine Hcl     REACTION: stomach problems   Furosemide     REACTION: swelling   Gabapentin     REACTION: edema   Naproxen Sodium     REACTION: edema   Paroxetine     REACTION: weight gain   Pregabalin     REACTION: swelling   Sulfonamide Derivatives     Rash, swollen gums,  lips   Tetracycline     REACTION:inflammed genitals   Venlafaxine     REACTION: sweating   Cephalexin Hives and Rash      REVIEW OF SYSTEMS (Negative unless checked)  Constitutional: _0 Weight loss  _1 Fever  _2 Chills Cardiac: _3 Chest pain   _4 Chest pressure   _5 Palpitations   _6 Shortness of breath when laying flat   _7 Shortness of breath with exertion. Vascular:  _8 Pain in legs with walking   _9 Pain in legs at rest  _10 History of DVT   _11 Phlebitis   _12 Swelling in legs   _13 Varicose veins   _14 Non-healing ulcers Pulmonary:   _15 Uses home oxygen   _16 Productive cough   _17 Hemoptysis   _18 Wheeze  _19 COPD   _20 Asthma Neurologic:  _21 Dizziness   _22 Seizures   _23 History of stroke   _24 History of TIA  _25 Aphasia   _26 Vissual changes   _27 Weakness or numbness in arm   _28 Weakness or numbness in leg Musculoskeletal:   _29 Joint swelling   _30 Joint pain   _31 Low back pain Hematologic:  _32 Easy bruising  _33 Easy bleeding   _34 Hypercoagulable state   _35 Anemic Gastrointestinal:  _36 Diarrhea   _37 Vomiting  _38 Gastroesophageal reflux/heartburn   _39 Difficulty swallowing. Genitourinary:  _40 Chronic kidney disease   _41 Difficult urination  _42 Frequent urination   _43 Blood in urine Skin:  _44 Rashes   _45 Ulcers  Psychological:  _46 History of anxiety   _47  History of major depression.  Physical Examination  Vitals:   04/06/21 0901  BP: 101/66  Pulse: 86  Resp: 16  Weight: 200 lb (90.7 kg)   Body mass index is 36.58 kg/m. Gen: WD/WN, NAD Head: [place]/AT, No temporalis wasting.  Ear/Nose/Throat: Hearing grossly intact, nares w/o erythema or drainage Eyes: PER, EOMI, sclera nonicteric.  Neck: Supple, no large masses.   Pulmonary:  Good air movement, no audible wheezing bilaterally, no use of accessory muscles.  Cardiac: RRR, no JVD Vascular:   AV malformation left ankle scabbed noninfected nonbleeding Vessel Right Left  Radial Palpable Palpable  Gastrointestinal: Non-distended. No guarding/no peritoneal signs.  Musculoskeletal: M/S 5/5 throughout.  No deformity or atrophy.  Neurologic: CN 2-12 intact. Symmetrical.  Speech is fluent. Motor exam as listed  above. Psychiatric: Judgment intact, Mood & affect appropriate for pt's clinical situation. Dermatologic: Venous rashes no ulcers noted.  No changes consistent with cellulitis. Lymph : No lichenification or skin changes of chronic lymphedema.  CBC Lab Results  Component Value Date   WBC 11.0 (H) 03/24/2021   HGB 13.1 03/24/2021   HCT 40.4 03/24/2021   MCV 90.2 03/24/2021   PLT 505 (H) 03/24/2021    BMET    Component Value Date/Time   NA 138 03/24/2021 0930   NA 141 02/06/2021 1109   NA 135 (L) 03/06/2012 0546   K 4.6 03/24/2021 0930   K 4.9 03/06/2012 0546   CL 99 03/24/2021 0930   CL 99 03/06/2012 0546   CO2 30 03/24/2021 0930   CO2 28 03/06/2012 0546   GLUCOSE 96 03/24/2021 0930   GLUCOSE 138 (H) 03/06/2012 0546   BUN 24 03/24/2021 0930   BUN 17 02/06/2021 1109   BUN 21 (H) 03/06/2012 0546   CREATININE 0.90 03/24/2021 0930   CALCIUM 9.4 03/24/2021 0930   CALCIUM 8.4 (L) 03/06/2012 0546   GFRNONAA 64 03/24/2021 0930   GFRAA 75 03/24/2021 0930   Estimated Creatinine Clearance: 60 mL/min (by C-G formula based on SCr of 0.9 mg/dL).  COAG No results found for: INR, PROTIME  Radiology No results found.   Assessment/Plan 1. AVM (arteriovenous  malformation) I recommend excision of the lesion.  This can be done under MAC as an outpatient  Risk and benefits were reviewed the patient.  Indications for the procedure were reviewed.  All questions were answered, the patient agrees to proceed.    2. Primary hypertension Continue antihypertensive medications as already ordered, these medications have been reviewed and there are no changes at this time.   3. Coronary artery disease involving native coronary artery of native heart without angina pectoris Continue cardiac and antihypertensive medications as already ordered and reviewed, no changes at this time.  Continue statin as ordered and reviewed, no changes at this time  Nitrates PRN for chest pain   4. Type 2  diabetes mellitus without complication, without long-term current use of insulin (HCC) Continue hypoglycemic medications as already ordered, these medications have been reviewed and there are no changes at this time.  Hgb A1C to be monitored as already arranged by primary service     Hortencia Pilar, MD  04/06/2021 1:08 PM

## 2021-04-06 NOTE — Progress Notes (Signed)
MRN : [id number]  Tina Berger is a 70 y.o. (1951/03/22) female who presents with chief complaint of  Chief Complaint  Patient presents with   Follow-up    Ultrasound follow up  .  History of Present Illness:   Patient returns for follow up of her AV malformation s/p hemorrhage.  No further episodes of bleeding.  No new pain.  Current Meds  Medication Sig   Acetaminophen (TYLENOL ARTHRITIS PAIN PO) Take 650 mg by mouth daily.   buPROPion (WELLBUTRIN XL) 150 MG 24 hr tablet Take 1 tablet (150 mg total) by mouth daily.   Chlorphen-Phenyleph-ASA (ALKA-SELTZER PLUS COLD PO) Take by mouth as needed.   cyclobenzaprine (FLEXERIL) 10 MG tablet TAKE 1/2 TO 1 TABLET BY  MOUTH ONCE DAILY AND 1  TABLET AT BEDTIME   DULoxetine (CYMBALTA) 60 MG capsule Take 1 capsule (60 mg total) by mouth daily.   esomeprazole (NEXIUM) 20 MG capsule Take 1 capsule (20 mg total) by mouth daily.   famotidine (PEPCID) 40 MG tablet Take 1 tablet (40 mg total) by mouth daily.   ferrous sulfate 325 (65 FE) MG EC tablet Take 325 mg by mouth daily with breakfast.   glucose blood test strip One Touch Ultra stripts blue-To check sugar once daily and as needed for DM2 250.00   GUAIFENESIN CR PO Take by mouth as needed.   HYDROmorphone HCl (EXALGO) 12 MG T24A SR tablet Take 12 mg by mouth daily.   hyoscyamine (LEVSIN SL) 0.125 MG SL tablet Take 1 tablet (0.125 mg total) by mouth every 4 (four) hours as needed for cramping.   ketoconazole (NIZORAL) 2 % shampoo SHAMPOO WITH A SMALL AMOUNT AS DIRECTED THREE TIMES A WEEK SHAMPOO SCALP 3 DAYS A WEEK, LET SHAMPOO SIT FOR 10 MINUTES AND RINSE OFF   levothyroxine (SYNTHROID) 150 MCG tablet Take 1 tablet (150 mcg total) by mouth daily.   LYSINE PO Take by mouth daily.   Magnesium 400 MG CAPS Take by mouth daily.   metFORMIN (GLUCOPHAGE) 500 MG tablet TAKE 1 TABLET BY MOUTH  TWICE DAILY WITH A MEAL   metoprolol succinate (TOPROL-XL) 50 MG 24 hr tablet TAKE 1 TABLET BY MOUTH   TWICE DAILY TAKE WITH OR  IMMEDIATELY FOLLOWING A  MEAL   ondansetron (ZOFRAN) 4 MG tablet Take 1 tablet (4 mg total) by mouth every 8 (eight) hours as needed for nausea or vomiting.   oxyCODONE (OXYCONTIN) 10 MG 12 hr tablet Take 10 mg by mouth 3 (three) times daily as needed.    rosuvastatin (CRESTOR) 5 MG tablet TAKE 1 TABLET BY MOUTH  TWICE WEEKLY   torsemide (DEMADEX) 20 MG tablet TAKE 1 TABLET BY MOUTH  DAILY AS NEEDED    Past Medical History:  Diagnosis Date   Acute renal insufficiency 05/31/2014   Anemia, iron deficiency    Bilateral lower extremity edema    a. uses torsemide   Cervical dysplasia    abnormal paps   Colon cancer screening 06/14/2014   Cough 08/08/2012   Degenerative disk disease 11/19/2011   Diabetes mellitus type II    Diet controlled   Diverticulosis    DJD (degenerative joint disease)    Drug rash 05/22/2011   Dry eyes    Edema    Elevated liver enzymes 03/21/2012   Encounter for routine gynecological examination 06/14/2014   ESOPHAGITIS 03/28/2007   Qualifier: Hospitalized for  By: Marcelino Scot CMA, [place] Bilberry     Fibromyalgia    GASTRITIS  03/28/2007   Qualifier: History of  By: Branson CMA, AAMA, Laurie     GERD (gastroesophageal reflux disease)    Hemorrhoids    external   HLD (hyperlipidemia)    HNP (herniated nucleus pulposus) 2/99   T6,7,8 with DJD   HTN (hypertension)    Hyperglycemia 05/13/2008   Qualifier: Diagnosis of  By: Tower MD, Marne Ann    Hypothyroidism    Interstitial lung disease (HCC)    ?   Left ovarian cyst    x 3, rupture   Osteoarthritis    hands   Osteopenia    mild-11/01; improved 12/05   Other screening mammogram 08/18/2011   Palpitations    PERSONAL HISTORY ALLERGY UNSPEC MEDICINAL AGENT 03/18/2010   Qualifier: Diagnosis of  By: Tower MD, Marne Ann    Raynaud's disease    Recurrent HSV (herpes simplex virus)    lesions in nose   Rhinitis 03/21/2012   Routine general medical examination at a health care facility 03/28/2011     Past Surgical History:  Procedure Laterality Date   ABDOMINAL EXPLORATION SURGERY     COLONOSCOPY  11/01   Diverticulosis; hemorrhoids   HAND SURGERY     left thumb   LASIK     bilateral    Social History Social History   Tobacco Use   Smoking status: Former    Pack years: 0.00    Types: Cigarettes   Smokeless tobacco: Never   Tobacco comments:    quit over 40 years  Vaping Use   Vaping Use: Never used  Substance Use Topics   Alcohol use: No    Alcohol/week: 0.0 standard drinks   Drug use: No    Family History Family History  Problem Relation Age of Onset   Emphysema Father        + smoker   Lung cancer Mother        + smoker   Coronary artery disease Mother        relatively young   Lymphoma Sister    Heart disease Brother    Lymphoma Brother    Parkinson's disease Brother    Anemia Brother        aplastic    Diabetes Brother    Leukemia Son    Iron deficiency Daughter    Breast cancer Neg Hx     Allergies  Allergen Reactions   Keflex [Cephalexin] Anaphylaxis   Penicillins Anaphylaxis   Amitriptyline Hcl     REACTION: sedating   Atorvastatin     REACTION: muscle   Benicar [Olmesartan Medoxomil]     Muscle pain    Ceftin [Cefuroxime]     Swelling, "legs turn blue"   Cetirizine Hcl     REACTION: headache   Ciprofloxacin     REACTION: ? rash vs sun rxn   Clonidine Derivatives     Swelling    Diovan [Valsartan]     Thought it made her feel confused   Erythromycin     Rash, swollen gums    Etodolac     REACTION: reaction not known   Fluoxetine Hcl     REACTION: stomach problems   Furosemide     REACTION: swelling   Gabapentin     REACTION: edema   Naproxen Sodium     REACTION: edema   Paroxetine     REACTION: weight gain   Pregabalin     REACTION: swelling   Sulfonamide Derivatives     Rash, swollen gums,   lips   Tetracycline     REACTION:inflammed genitals   Venlafaxine     REACTION: sweating   Cephalexin Hives and Rash      REVIEW OF SYSTEMS (Negative unless checked)  Constitutional: _0 Weight loss  _1 Fever  _2 Chills Cardiac: _3 Chest pain   _4 Chest pressure   _5 Palpitations   _6 Shortness of breath when laying flat   _7 Shortness of breath with exertion. Vascular:  _8 Pain in legs with walking   _9 Pain in legs at rest  _10 History of DVT   _11 Phlebitis   _12 Swelling in legs   _13 Varicose veins   _14 Non-healing ulcers Pulmonary:   _15 Uses home oxygen   _16 Productive cough   _17 Hemoptysis   _18 Wheeze  _19 COPD   _20 Asthma Neurologic:  _21 Dizziness   _22 Seizures   _23 History of stroke   _24 History of TIA  _25 Aphasia   _26 Vissual changes   _27 Weakness or numbness in arm   _28 Weakness or numbness in leg Musculoskeletal:   _29 Joint swelling   _30 Joint pain   _31 Low back pain Hematologic:  _32 Easy bruising  _33 Easy bleeding   _34 Hypercoagulable state   _35 Anemic Gastrointestinal:  _36 Diarrhea   _37 Vomiting  _38 Gastroesophageal reflux/heartburn   _39 Difficulty swallowing. Genitourinary:  _40 Chronic kidney disease   _41 Difficult urination  _42 Frequent urination   _43 Blood in urine Skin:  _44 Rashes   _45 Ulcers  Psychological:  _46 History of anxiety   _47  History of major depression.  Physical Examination  Vitals:   04/06/21 0901  BP: 101/66  Pulse: 86  Resp: 16  Weight: 200 lb (90.7 kg)   Body mass index is 36.58 kg/m. Gen: WD/WN, NAD Head: [place]/AT, No temporalis wasting.  Ear/Nose/Throat: Hearing grossly intact, nares w/o erythema or drainage Eyes: PER, EOMI, sclera nonicteric.  Neck: Supple, no large masses.   Pulmonary:  Good air movement, no audible wheezing bilaterally, no use of accessory muscles.  Cardiac: RRR, no JVD Vascular:   AV malformation left ankle scabbed noninfected nonbleeding Vessel Right Left  Radial Palpable Palpable  Gastrointestinal: Non-distended. No guarding/no peritoneal signs.  Musculoskeletal: M/S 5/5 throughout.  No deformity or atrophy.  Neurologic: CN 2-12 intact. Symmetrical.  Speech is fluent. Motor exam as listed  above. Psychiatric: Judgment intact, Mood & affect appropriate for pt's clinical situation. Dermatologic: Venous rashes no ulcers noted.  No changes consistent with cellulitis. Lymph : No lichenification or skin changes of chronic lymphedema.  CBC Lab Results  Component Value Date   WBC 11.0 (H) 03/24/2021   HGB 13.1 03/24/2021   HCT 40.4 03/24/2021   MCV 90.2 03/24/2021   PLT 505 (H) 03/24/2021    BMET    Component Value Date/Time   NA 138 03/24/2021 0930   NA 141 02/06/2021 1109   NA 135 (L) 03/06/2012 0546   K 4.6 03/24/2021 0930   K 4.9 03/06/2012 0546   CL 99 03/24/2021 0930   CL 99 03/06/2012 0546   CO2 30 03/24/2021 0930   CO2 28 03/06/2012 0546   GLUCOSE 96 03/24/2021 0930   GLUCOSE 138 (H) 03/06/2012 0546   BUN 24 03/24/2021 0930   BUN 17 02/06/2021 1109   BUN 21 (H) 03/06/2012 0546   CREATININE 0.90 03/24/2021 0930   CALCIUM 9.4 03/24/2021 0930   CALCIUM 8.4 (L) 03/06/2012 0546   GFRNONAA 64 03/24/2021 0930   GFRAA 75 03/24/2021 0930   Estimated Creatinine Clearance: 60 mL/min (by C-G formula based on SCr of 0.9 mg/dL).  COAG No results found for: INR, PROTIME  Radiology No results found.   Assessment/Plan 1. AVM (arteriovenous  malformation) I recommend excision of the lesion.  This can be done under MAC as an outpatient  Risk and benefits were reviewed the patient.  Indications for the procedure were reviewed.  All questions were answered, the patient agrees to proceed.    2. Primary hypertension Continue antihypertensive medications as already ordered, these medications have been reviewed and there are no changes at this time.   3. Coronary artery disease involving native coronary artery of native heart without angina pectoris Continue cardiac and antihypertensive medications as already ordered and reviewed, no changes at this time.  Continue statin as ordered and reviewed, no changes at this time  Nitrates PRN for chest pain   4. Type 2  diabetes mellitus without complication, without long-term current use of insulin (HCC) Continue hypoglycemic medications as already ordered, these medications have been reviewed and there are no changes at this time.  Hgb A1C to be monitored as already arranged by primary service     Hortencia Pilar, MD  04/06/2021 1:08 PM

## 2021-04-06 NOTE — Patient Instructions (Signed)
INSTRUCTIONS FOR SURGERY     Your surgery is scheduled for: Wednesday, June 22ND     To find out your arrival time for the day of surgery,          please call [phone] between 1 pm and 3 pm on : Tuesday, June 21ST     When you arrive for surgery, report to the [facility]. ONCE THEY HAVE COMPLETED THEIR PROCESS, PROCEED TO THE  SECOND FLOOR AND SIGN IN AT THE SURGERY DESK.    REMEMBER: Instructions that are not followed completely may result in serious medical risk,  up to and including death, or upon the discretion of your surgeon and anesthesiologist,            your surgery may need to be rescheduled.  __X__ 1. Do not eat food after midnight the night before your procedure.                    No gum, candy, lozenger, tic tacs, tums or hard candies.                  ABSOLUTELY NOTHING SOLID IN YOUR MOUTH AFTER MIDNIGHT                    You may drink unlimited clear liquids up to 2 hours before you are scheduled to arrive for surgery.                   Do not drink anything within those 2 hours unless you need to take medicine, then take the                   smallest amount you need.  Clear liquids include:  water, apple juice without pulp,                   any flavor Gatorade, Black coffee, black tea.  Sugar may be added but no dairy/ honey /lemon.                  Broth and jello is not considered a clear liquid. WATER IS BEST SINCE YOU ARE DIABETIC.  __x__  2. On the morning of surgery, please brush your teeth with toothpaste and water. You may rinse with                  mouthwash if you wish but DO NOT SWALLOW TOOTHPASTE OR MOUTHWASH  __X___3. NO alcohol for 24 hours before or after surgery.  __x___ 4.  Do NOT smoke or use e-cigarettes for 24 HOURS PRIOR TO SURGERY.                      DO NOT  Use any chewable tobacco products for at least 6 hours prior to  surgery.  __x___ 5. If you start any new medication after this appointment and prior to surgery, please                   Bring it with you on the day of surgery.  ___x__ 6. Notify your  doctor if there is any change in your medical condition, such as fever,                   infection, vomitting, diarrhea or any open sores.  __x___ 7.  USE the CHG SOAP as instructed, the night before surgery and the day of surgery.                   Once you have washed with this soap, do NOT use any of the following: Powders, perfumes                    or lotions. Please do not wear make up, hairpins, clips or nail polish. You may wear deodorant.                                                      Women need to shave 48 hours prior to surgery.                   DO NOT wear ANY jewelry on the day of surgery. If there are rings that are too tight to                    remove easily, please address this prior to the surgery day. Piercings need to be removed.                                                                     NO METAL ON YOUR BODY.                    Do NOT bring any valuables.  If you came to Pre-Admit testing then you will not need license,                     insurance card or credit card.  If you will be staying overnight, please either leave your things in                     the car or have your family be responsible for these items.                     [facility] IS NOT RESPONSIBLE FOR BELONGINGS OR VALUABLES.  ___X__ 8. DO NOT wear contact lenses on surgery day.  You may not have dentures,                     Hearing aides, contacts or glasses in the operating room. These items can be                    Placed in the Recovery Room to receive immediately after surgery.  __x___ 9. IF YOU ARE SCHEDULED TO GO HOME ON THE SAME DAY, YOU MUST                   Have someone to drive you home and to stay with you  for the first 24 hours.  Have an arrangement prior to  arriving on surgery day.  ___x__ 10. Take the following medications on the morning of surgery with a sip of water:                              1.  TORSEMIDE                     2.  WELLBUTRIN                     3.  CYMBALTA/DULOXETINE                     4.  NEXIUM/ ESOMEPRAZOLE                     5.  SYNTHROID                     6.  METOPROLOL                     7.  EXALGO                     8.  RAMIPRIL                     9.  HYCOSAMINE                   10.  OXYCODONE, if needed  CONTINUE TAKING YOUR EVENING MEDICATIONS AS USUAL.  _____ 11.  Follow any instructions provided to you by your surgeon.                        Such as enema, clear liquid bowel prep  __X__  12. STOP ALL ASPIRIN PRODUCTS IMMEDIATELY                       THIS INCLUDES BC POWDERS / GOODIES POWDER /                             ALKA SELTZER  __x___ 13. STOP Anti-inflammatories IMMEDIATELY                      This includes IBUPROFEN / MOTRIN / ADVIL / ALEVE/ NAPROXYN                    YOU MAY TAKE TYLENOL ANY TIME PRIOR TO SURGERY.  __X___ 73.  Stop supplements until after surgery.                     This includes: FERROUS SULFATE // LYSINE // MAGNESIUM // VITAMIN D  ______16.  Stop Metformin 2 full days prior to surgery.  LAST DOSE THIS MORNING(04/06/21)                                     Do NOT take any diabetes medications on surgery day.  __X____17.  Continue to take the following medications but do not take on the morning of surgery:                            DOCUSATE SODIUM  ___X___18.  Wear clean and comfortable clothing to the hospital. HAVE PANTS OR SHORTS THAT WILL ALLOW YOUR FOOT AND BANDAGE TO FIT.  BRING CELL PHONE NUMBERS FOR YOUR CONTACTS.  COVID TEST ALONG WITH BLOOD WORK AND EKG ON 04/07/21 AT 10:00AM.  GOOD LUCK!!

## 2021-04-06 NOTE — Telephone Encounter (Signed)
Spoke with the patient and she is scheduled with Dr. Delana Meyer for a removal of skin nodule on the left on 04/08/21 at the [facility]. Pre-op phone call is on 04/06/21 between 1-5 pm. Pre-surgical instructions were discussed and patient understood.

## 2021-04-07 ENCOUNTER — Telehealth: Payer: Medicare Other

## 2021-04-07 ENCOUNTER — Other Ambulatory Visit
Admission: RE | Admit: 2021-04-07 | Discharge: 2021-04-07 | Disposition: A | Discharge status: Home / Self Care | Payer: Medicare Other | Source: Ambulatory Visit | Attending: Internal Medicine | Admitting: Internal Medicine

## 2021-04-07 ENCOUNTER — Other Ambulatory Visit (HOSPITAL_COMMUNITY): Payer: Medicare Other

## 2021-04-07 DIAGNOSIS — Z20822 Contact with and (suspected) exposure to covid-19: Secondary | ICD-10-CM | POA: Insufficient documentation

## 2021-04-07 DIAGNOSIS — Z01818 Encounter for other preprocedural examination: Secondary | ICD-10-CM | POA: Diagnosis not present

## 2021-04-07 DIAGNOSIS — Z01810 Encounter for preprocedural cardiovascular examination: Secondary | ICD-10-CM | POA: Diagnosis not present

## 2021-04-07 LAB — CBC WITH DIFFERENTIAL/PLATELET
Abs Immature Granulocytes: 0.04 10*3/uL (ref 0.00–0.07)
Basophils Absolute: 0.1 10*3/uL (ref 0.0–0.1)
Basophils Relative: 1 %
Eosinophils Absolute: 0.5 10*3/uL (ref 0.0–0.5)
Eosinophils Relative: 6 %
HCT: 34.5 % — ABNORMAL LOW (ref 36.0–46.0)
Hemoglobin: 11.1 g/dL — ABNORMAL LOW (ref 12.0–15.0)
Immature Granulocytes: 1 %
Lymphocytes Relative: 20 %
Lymphs Abs: 1.7 10*3/uL (ref 0.7–4.0)
MCH: 29.7 pg (ref 26.0–34.0)
MCHC: 32.2 g/dL (ref 30.0–36.0)
MCV: 92.2 fL (ref 80.0–100.0)
Monocytes Absolute: 0.9 10*3/uL (ref 0.1–1.0)
Monocytes Relative: 11 %
Neutro Abs: 5.6 10*3/uL (ref 1.7–7.7)
Neutrophils Relative %: 61 %
Platelets: 346 10*3/uL (ref 150–400)
RBC: 3.74 MIL/uL — ABNORMAL LOW (ref 3.87–5.11)
RDW: 13.5 % (ref 11.5–15.5)
WBC: 8.9 10*3/uL (ref 4.0–10.5)
nRBC: 0 % (ref 0.0–0.2)

## 2021-04-07 LAB — BASIC METABOLIC PANEL
Anion gap: 6 (ref 5–15)
BUN: 34 mg/dL — ABNORMAL HIGH (ref 8–23)
CO2: 30 mmol/L (ref 22–32)
Calcium: 8.7 mg/dL — ABNORMAL LOW (ref 8.9–10.3)
Chloride: 101 mmol/L (ref 98–111)
Creatinine, Ser: 0.91 mg/dL (ref 0.44–1.00)
GFR, Estimated: >60 mL/min (ref >60)
Glucose, Bld: 135 mg/dL — ABNORMAL HIGH (ref 70–99)
Potassium: 4.5 mmol/L (ref 3.5–5.1)
Sodium: 137 mmol/L (ref 135–145)

## 2021-04-07 LAB — TYPE AND SCREEN
ABO/RH(D): A POS
Antibody Screen: NEGATIVE

## 2021-04-07 LAB — SARS CORONAVIRUS 2 (TAT 6-24 HRS): SARS Coronavirus 2: NEGATIVE

## 2021-04-07 MED ORDER — CHLORHEXIDINE GLUCONATE 0.12 % MT SOLN: 15.0000 mL | Freq: Once | OROMUCOSAL | Status: AC

## 2021-04-07 MED ORDER — CHLORHEXIDINE GLUCONATE CLOTH 2 % EX PADS: 6.0000 | MEDICATED_PAD | Freq: Once | CUTANEOUS | Status: DC

## 2021-04-07 MED ORDER — SODIUM CHLORIDE 0.9 % IV SOLN: INTRAVENOUS | Status: DC

## 2021-04-07 MED ORDER — ORAL CARE MOUTH RINSE: 15.0000 mL | Freq: Once | OROMUCOSAL | Status: AC

## 2021-04-07 MED ORDER — VANCOMYCIN HCL IN DEXTROSE 1-5 GM/200ML-% IV SOLN: 1000.0000 mg | INTRAVENOUS | Status: AC

## 2021-04-08 ENCOUNTER — Ambulatory Visit: Payer: Medicare Other | Admitting: Urgent Care

## 2021-04-08 ENCOUNTER — Ambulatory Visit: Payer: Medicare Other | Admitting: Certified Registered"

## 2021-04-08 ENCOUNTER — Encounter
Admission: RE | Disposition: A | Discharge status: Home / Self Care | Payer: Self-pay | Source: Home / Self Care | Attending: Vascular Surgery

## 2021-04-08 ENCOUNTER — Other Ambulatory Visit: Payer: Self-pay

## 2021-04-08 ENCOUNTER — Ambulatory Visit
Admission: RE | Admit: 2021-04-08 | Discharge: 2021-04-08 | Disposition: A | Discharge status: Home / Self Care | Payer: Medicare Other | Attending: Vascular Surgery | Admitting: Vascular Surgery

## 2021-04-08 ENCOUNTER — Encounter: Payer: Self-pay | Admitting: Vascular Surgery

## 2021-04-08 DIAGNOSIS — E119 Type 2 diabetes mellitus without complications: Secondary | ICD-10-CM | POA: Insufficient documentation

## 2021-04-08 DIAGNOSIS — D1801 Hemangioma of skin and subcutaneous tissue: Secondary | ICD-10-CM | POA: Diagnosis not present

## 2021-04-08 DIAGNOSIS — Z79890 Hormone replacement therapy (postmenopausal): Secondary | ICD-10-CM | POA: Insufficient documentation

## 2021-04-08 DIAGNOSIS — Z880 Allergy status to penicillin: Secondary | ICD-10-CM | POA: Insufficient documentation

## 2021-04-08 DIAGNOSIS — Z7984 Long term (current) use of oral hypoglycemic drugs: Secondary | ICD-10-CM | POA: Insufficient documentation

## 2021-04-08 DIAGNOSIS — Q2739 Arteriovenous malformation, other site: Secondary | ICD-10-CM | POA: Diagnosis present

## 2021-04-08 DIAGNOSIS — Z888 Allergy status to other drugs, medicaments and biological substances status: Secondary | ICD-10-CM | POA: Diagnosis not present

## 2021-04-08 DIAGNOSIS — Z79899 Other long term (current) drug therapy: Secondary | ICD-10-CM | POA: Insufficient documentation

## 2021-04-08 DIAGNOSIS — Q2732 Arteriovenous malformation of vessel of lower limb: Secondary | ICD-10-CM | POA: Diagnosis not present

## 2021-04-08 DIAGNOSIS — Z881 Allergy status to other antibiotic agents status: Secondary | ICD-10-CM | POA: Diagnosis not present

## 2021-04-08 DIAGNOSIS — Z886 Allergy status to analgesic agent status: Secondary | ICD-10-CM | POA: Insufficient documentation

## 2021-04-08 DIAGNOSIS — Z882 Allergy status to sulfonamides status: Secondary | ICD-10-CM | POA: Diagnosis not present

## 2021-04-08 DIAGNOSIS — I2510 Atherosclerotic heart disease of native coronary artery without angina pectoris: Secondary | ICD-10-CM | POA: Insufficient documentation

## 2021-04-08 DIAGNOSIS — Z87891 Personal history of nicotine dependence: Secondary | ICD-10-CM | POA: Diagnosis not present

## 2021-04-08 DIAGNOSIS — I10 Essential (primary) hypertension: Secondary | ICD-10-CM | POA: Insufficient documentation

## 2021-04-08 DIAGNOSIS — I770 Arteriovenous fistula, acquired: Secondary | ICD-10-CM | POA: Diagnosis not present

## 2021-04-08 DIAGNOSIS — K219 Gastro-esophageal reflux disease without esophagitis: Secondary | ICD-10-CM | POA: Diagnosis not present

## 2021-04-08 DIAGNOSIS — Z87892 Personal history of anaphylaxis: Secondary | ICD-10-CM | POA: Insufficient documentation

## 2021-04-08 HISTORY — PX: BIOPSY OF SKIN SUBCUTANEOUS TISSUE AND/OR MUCOUS MEMBRANE: SHX6741

## 2021-04-08 LAB — GLUCOSE, CAPILLARY
Glucose-Capillary: 124 mg/dL — ABNORMAL HIGH (ref 70–99)
Glucose-Capillary: 135 mg/dL — ABNORMAL HIGH (ref 70–99)

## 2021-04-08 LAB — ABO/RH: ABO/RH(D): A POS

## 2021-04-08 SURGERY — BIOPSY, SKIN, SUBCUTANEOUS TISSUE, OR MUCOUS MEMBRANE
Anesthesia: General | Laterality: Left

## 2021-04-08 MED ORDER — BUPIVACAINE HCL (PF) 0.5 % IJ SOLN
INTRAMUSCULAR | Status: AC
  Filled 2021-04-08: qty 30

## 2021-04-08 MED ORDER — FENTANYL CITRATE (PF) 100 MCG/2ML IJ SOLN
INTRAMUSCULAR | Status: DC | PRN
  Administered 2021-04-08 (×2): 50 ug via INTRAVENOUS

## 2021-04-08 MED ORDER — BUPIVACAINE LIPOSOME 1.3 % IJ SUSP
INTRAMUSCULAR | Status: DC | PRN
  Administered 2021-04-08: 4 mL

## 2021-04-08 MED ORDER — PROPOFOL 1000 MG/100ML IV EMUL
INTRAVENOUS | Status: AC
  Filled 2021-04-08: qty 100

## 2021-04-08 MED ORDER — VANCOMYCIN HCL IN DEXTROSE 1-5 GM/200ML-% IV SOLN
INTRAVENOUS | Status: AC
  Administered 2021-04-08: 1000 mg via INTRAVENOUS
  Filled 2021-04-08: qty 200

## 2021-04-08 MED ORDER — FENTANYL CITRATE (PF) 100 MCG/2ML IJ SOLN: 25.0000 ug | INTRAMUSCULAR | Status: DC | PRN

## 2021-04-08 MED ORDER — ONDANSETRON HCL 4 MG/2ML IJ SOLN: 4.0000 mg | Freq: Once | INTRAMUSCULAR | Status: DC | PRN

## 2021-04-08 MED ORDER — BUPIVACAINE LIPOSOME 1.3 % IJ SUSP
INTRAMUSCULAR | Status: AC
  Filled 2021-04-08: qty 20

## 2021-04-08 MED ORDER — PROPOFOL 500 MG/50ML IV EMUL
INTRAVENOUS | Status: DC | PRN
  Administered 2021-04-08: 125 ug/kg/min via INTRAVENOUS

## 2021-04-08 MED ORDER — FENTANYL CITRATE (PF) 100 MCG/2ML IJ SOLN
INTRAMUSCULAR | Status: AC
  Filled 2021-04-08: qty 2

## 2021-04-08 MED ORDER — PROPOFOL 10 MG/ML IV BOLUS
INTRAVENOUS | Status: DC | PRN
  Administered 2021-04-08: 50 mg via INTRAVENOUS

## 2021-04-08 MED ORDER — CHLORHEXIDINE GLUCONATE 0.12 % MT SOLN
OROMUCOSAL | Status: AC
  Administered 2021-04-08: 15 mL via OROMUCOSAL
  Filled 2021-04-08: qty 15

## 2021-04-08 SURGICAL SUPPLY — 31 items
ADH SKN CLS APL DERMABOND .7 (GAUZE/BANDAGES/DRESSINGS) ×1
APL PRP STRL LF DISP 70% ISPRP (MISCELLANEOUS) ×1
BNDG COHESIVE 4X5 TAN STRL (GAUZE/BANDAGES/DRESSINGS) ×2 IMPLANT
BNDG GAUZE 4.5X4.1 6PLY STRL (MISCELLANEOUS) ×2 IMPLANT
CHLORAPREP W/TINT 26 (MISCELLANEOUS) ×2 IMPLANT
COVER WAND RF STERILE (DRAPES) ×2 IMPLANT
DERMABOND ADVANCED (GAUZE/BANDAGES/DRESSINGS) ×1
DERMABOND ADVANCED .7 DNX12 (GAUZE/BANDAGES/DRESSINGS) IMPLANT
DRSG TEGADERM 4X4.75 (GAUZE/BANDAGES/DRESSINGS) ×1 IMPLANT
DRSG TELFA 4X3 1S NADH ST (GAUZE/BANDAGES/DRESSINGS) ×2 IMPLANT
ELECT CAUTERY BLADE 6.4 (BLADE) ×2 IMPLANT
ELECT REM PT RETURN 9FT ADLT (ELECTROSURGICAL) ×2
ELECTRODE REM PT RTRN 9FT ADLT (ELECTROSURGICAL) ×1 IMPLANT
GAUZE SPONGE 4X4 12PLY STRL (GAUZE/BANDAGES/DRESSINGS) ×2 IMPLANT
GLOVE SURG SYN 8.0 (GLOVE) ×2 IMPLANT
GLOVE SURG SYN 8.0 PF PI (GLOVE) ×1 IMPLANT
GOWN STRL REUS W/ TWL LRG LVL3 (GOWN DISPOSABLE) ×1 IMPLANT
GOWN STRL REUS W/ TWL XL LVL3 (GOWN DISPOSABLE) ×2 IMPLANT
GOWN STRL REUS W/TWL LRG LVL3 (GOWN DISPOSABLE) ×2
GOWN STRL REUS W/TWL XL LVL3 (GOWN DISPOSABLE) ×4
KIT TURNOVER KIT A (KITS) ×2 IMPLANT
LABEL OR SOLS (LABEL) ×2 IMPLANT
MANIFOLD NEPTUNE II (INSTRUMENTS) ×2 IMPLANT
NS IRRIG 500ML POUR BTL (IV SOLUTION) ×2 IMPLANT
PACK EXTREMITY ARMC (MISCELLANEOUS) ×2 IMPLANT
PAD PREP 24X41 OB/GYN DISP (PERSONAL CARE ITEMS) ×2 IMPLANT
SOL PREP PVP 2OZ (MISCELLANEOUS) ×2
SOLUTION PREP PVP 2OZ (MISCELLANEOUS) ×1 IMPLANT
SPONGE LAP 18X18 RF (DISPOSABLE) ×2 IMPLANT
STOCKINETTE IMPERV 14X48 (MISCELLANEOUS) ×1 IMPLANT
SWAB CULTURE AMIES ANAERIB BLU (MISCELLANEOUS) ×2 IMPLANT

## 2021-04-08 NOTE — Transfer of Care (Signed)
Immediate Anesthesia Transfer of Care Note  Patient: Tina Berger  Procedure(s) Performed: BIOPSY OF SKIN SUBCUTANEOUS TISSUE AND/OR MUCOUS MEMBRANE ( REMOVAL SKIN NODULE) (Left)  Patient Location: PACU  Anesthesia Type:General  Level of Consciousness: awake and alert   Airway & Oxygen Therapy: Patient Spontanous Breathing and Patient connected to nasal cannula oxygen  Post-op Assessment: Report given to RN and Post -op Vital signs reviewed and stable  Post vital signs: Reviewed and stable  Last Vitals:  Vitals Value Taken Time  BP 106/57 04/08/21 1715  Temp    Pulse 79 04/08/21 1716  Resp 19 04/08/21 1716  SpO2 100 % 04/08/21 1716  Vitals shown include unvalidated device data.  Last Pain:  Vitals:   04/08/21 1524  TempSrc: Oral  PainSc: 0-No pain         Complications: No notable events documented.

## 2021-04-08 NOTE — Discharge Instructions (Signed)
AMBULATORY SURGERY  ?DISCHARGE INSTRUCTIONS ? ? ?The drugs that you were given will stay in your system until tomorrow so for the next 24 hours you should not: ? ?Drive an automobile ?Make any legal decisions ?Drink any alcoholic beverage ? ? ?You may resume regular meals tomorrow.  Today it is better to start with liquids and gradually work up to solid foods. ? ?You may eat anything you prefer, but it is better to start with liquids, then soup and crackers, and gradually work up to solid foods. ? ? ?Please notify your doctor immediately if you have any unusual bleeding, trouble breathing, redness and pain at the surgery site, drainage, fever, or pain not relieved by medication. ? ? ? ?Additional Instructions: ? ? ? ?Please contact your physician with any problems or Same Day Surgery at [phone], Monday through Friday 6 am to 4 pm, or [facility] [place] Main number at [phone].  ?

## 2021-04-08 NOTE — Op Note (Signed)
    OPERATIVE NOTE   PROCEDURE: Excision AV malformation left ankle  PRE-OPERATIVE DIAGNOSIS: AV malformation with hemorrhage  POST-OPERATIVE DIAGNOSIS: Same  SURGEON: Hortencia Pilar  ASSISTANT(S): Ms. Hezzie Bump  ANESTHESIA: MAC  ESTIMATED BLOOD LOSS: Less than 5 cc cc  FINDING(S): Lesion involving the skin and subcutaneous tissues fascia was intact  SPECIMEN(S): Surrounding skin and AV malformation to pathology for permanent section  INDICATIONS:   Tina Berger is a 70 y.o. female who presents with an episode of profound hemorrhage from a nodule located on her left ankle.  The bleeding was spontaneous and she required treatment in the emergency room to get hemostasis.  She was seen in my office in follow-up ultrasound did not demonstrate any dominant feeding vessel and therefore I have elected to excise this lesion en bloc to prevent further hemorrhage and to be able to characterize it histologically.  Risks and benefits of been reviewed all questions answered patient agrees to proceed.  DESCRIPTION: After full informed written consent was obtained from the patient, the patient was brought back to the operating room and placed supine upon the operating table.  Prior to induction, the patient received IV antibiotics.   After obtaining adequate anesthesia, the patient was then prepped and draped in the standard fashion for a excision of the lesion on the left ankle.  Using a surgical marker the incision was mapped out according to lines of [employer].  A combination of Marcaine with Exparel was then infiltrated into the skin surrounding the nodule as well as into the subcutaneous tissues.  Using a 15 blade scalpel an elliptical incision was made surrounding the nodule.  The incision was carried down through the soft tissues to expose the fascia.  The surrounding skin and the nodule were then resected en bloc using Bovie cautery.  Hemostasis was then obtained with Bovie  cautery.  Once hemostasis was achieved the wound was irrigated with saline and then closed in 2 layers using interrupted 3-0 Vicryl suture in a deep dermal layer and 4-0 Monocryl subcuticular.  Dermabond was then applied followed by light pressure dressing with Coban.  The patient tolerated this procedure well.   COMPLICATIONS: None  CONDITION: Margaretmary Dys [facility]  Office: [phone]   04/08/2021, 5:27 PM

## 2021-04-08 NOTE — Anesthesia Preprocedure Evaluation (Signed)
Anesthesia Evaluation  Patient identified by MRN, date of birth, ID band Patient awake    Reviewed: Allergy & Precautions, H&P , NPO status , Patient's Chart, lab work & pertinent test results, reviewed documented beta blocker date and time   History of Anesthesia Complications (+) PONV and history of anesthetic complications  Airway Mallampati: II   Neck ROM: full    Dental  (+) Teeth Intact   Pulmonary neg pulmonary ROS, former smoker,    Pulmonary exam normal        Cardiovascular Exercise Tolerance: Poor hypertension, On Medications + CAD  Normal cardiovascular exam+ dysrhythmias  Rhythm:regular Rate:Normal     Neuro/Psych PSYCHIATRIC DISORDERS Anxiety Depression  Neuromuscular disease    GI/Hepatic Neg liver ROS, GERD  Medicated,  Endo/Other  diabetesHypothyroidism   Renal/GU Renal disease  negative genitourinary   Musculoskeletal   Abdominal   Peds  Hematology  (+) Blood dyscrasia, anemia ,   Anesthesia Other Findings Past Medical History: 05/31/2014: Acute renal insufficiency     Comment:  pt not aware of this diagnosis No date: Anemia, iron deficiency No date: Anxiety No date: Bilateral lower extremity edema     Comment:  a. uses torsemide No date: Cervical dysplasia     Comment:  abnormal paps 06/14/2014: Colon cancer screening No date: Coronary artery disease 08/08/2012: Cough 11/19/2011: Degenerative disk disease No date: Depression No date: Diabetes mellitus type II No date: Diverticulosis No date: DJD (degenerative joint disease) 05/22/2011: Drug rash No date: Dry eyes No date: Dysrhythmia     Comment:  metoprolol. No date: Edema 03/21/2012: Elevated liver enzymes 06/14/2014: Encounter for routine gynecological examination 03/28/2007: ESOPHAGITIS     Comment:  Qualifier: Hospitalized for  By: Marcelino Scot CMA, [place]   No date: Fibromyalgia 03/28/2007: GASTRITIS      Comment:  Qualifier: History of  By: Marcelino Scot CMA, [place]   No date: GERD (gastroesophageal reflux disease) No date: Hemorrhoids     Comment:  external No date: HLD (hyperlipidemia) 11/1997: HNP (herniated nucleus pulposus)     Comment:  T6,7,8 with DJD No date: HTN (hypertension) 05/13/2008: Hyperglycemia     Comment:  Qualifier: Diagnosis of  By: Glori Bickers MD, Carmell [place]  No date: Hypothyroidism No date: Interstitial lung disease ([place])     Comment:  pt not aware of this No date: Left ovarian cyst     Comment:  x 3, rupture No date: Osteoarthritis     Comment:  hands No date: Osteopenia     Comment:  mild-11/01; improved 12/05 08/18/2011: Other screening mammogram No date: Palpitations 03/18/2010: PERSONAL HISTORY ALLERGY UNSPEC MEDICINAL AGENT     Comment:  Qualifier: Diagnosis of  By: Glori Bickers MD, Carmell [place]  No date: PONV (postoperative nausea and vomiting) No date: Raynaud's disease No date: Recurrent HSV (herpes simplex virus)     Comment:  lesions in nose or mouth with frequent ulcers. d/t meds               and dry mouth No date: Rheumatoid arthritis ([place]) 03/21/2012: Rhinitis 03/28/2011: Routine general medical examination at a health care  facility Past Surgical History: No date: ABDOMINAL EXPLORATION SURGERY 1991: ACDF 08/2000: COLONOSCOPY     Comment:  Diverticulosis; hemorrhoids No date: HAND SURGERY; Left     Comment:  left thumb. PIN REMOVED No date: LASIK     Comment:  bilateral BMI    Body Mass  Index: 36.05 kg/m     Reproductive/Obstetrics negative OB ROS                             Anesthesia Physical Anesthesia Plan  ASA: 3  Anesthesia Plan: General   Post-op Pain Management:    Induction:   PONV Risk Score and Plan: 4 or greater  Airway Management Planned:   Additional Equipment:   Intra-op Plan:   Post-operative Plan:   Informed Consent: I have reviewed the patients History and Physical, chart, labs and  discussed the procedure including the risks, benefits and alternatives for the proposed anesthesia with the patient or authorized representative who has indicated his/her understanding and acceptance.     Dental Advisory Given  Plan Discussed with: CRNA  Anesthesia Plan Comments:         Anesthesia Quick Evaluation

## 2021-04-08 NOTE — Interval H&P Note (Signed)
History and Physical Interval Note:  04/08/2021 3:33 PM  Tina Berger  has presented today for surgery, with the diagnosis of AV MALFORMATION WITH HEMORRHAGE.  The various methods of treatment have been discussed with the patient and family. After consideration of risks, benefits and other options for treatment, the patient has consented to  Procedure(s): BIOPSY OF SKIN SUBCUTANEOUS TISSUE AND/OR MUCOUS MEMBRANE ( REMOVAL SKIN NODULE) (Left) as a surgical intervention.  The patient's history has been reviewed, patient examined, no change in status, stable for surgery.  I have reviewed the patient's chart and labs.  Questions were answered to the patient's satisfaction.     Hortencia Pilar

## 2021-04-08 NOTE — Anesthesia Postprocedure Evaluation (Signed)
Anesthesia Post Note  Patient: Tina Berger  Procedure(s) Performed: BIOPSY OF SKIN SUBCUTANEOUS TISSUE AND/OR MUCOUS MEMBRANE ( REMOVAL SKIN NODULE) (Left)  Patient location during evaluation: PACU Anesthesia Type: General and Combined Spinal/Epidural Level of consciousness: awake and alert, awake and oriented Pain management: pain level controlled Vital Signs Assessment: post-procedure vital signs reviewed and stable Respiratory status: spontaneous breathing, nonlabored ventilation and respiratory function stable Cardiovascular status: blood pressure returned to baseline and stable Postop Assessment: no apparent nausea or vomiting Anesthetic complications: no   No notable events documented.   Last Vitals:  Vitals:   04/08/21 1730 04/08/21 1741  BP: 112/71 (!) 150/73  Pulse: 72 65  Resp: 15 16  Temp: 36.7 C 36.6 C  SpO2: 95% 95%    Last Pain:  Vitals:   04/08/21 1741  TempSrc: Temporal  PainSc: 0-No pain                 Phill Mutter

## 2021-04-09 ENCOUNTER — Encounter: Payer: Self-pay | Admitting: Vascular Surgery

## 2021-04-10 ENCOUNTER — Ambulatory Visit: Payer: Medicare Other | Admitting: Internal Medicine

## 2021-04-10 LAB — SURGICAL PATHOLOGY

## 2021-04-16 DIAGNOSIS — M4725 Other spondylosis with radiculopathy, thoracolumbar region: Secondary | ICD-10-CM | POA: Diagnosis not present

## 2021-04-16 DIAGNOSIS — Z79891 Long term (current) use of opiate analgesic: Secondary | ICD-10-CM | POA: Diagnosis not present

## 2021-04-16 DIAGNOSIS — M5137 Other intervertebral disc degeneration, lumbosacral region: Secondary | ICD-10-CM | POA: Diagnosis not present

## 2021-04-16 DIAGNOSIS — G894 Chronic pain syndrome: Secondary | ICD-10-CM | POA: Diagnosis not present

## 2021-04-16 DIAGNOSIS — M961 Postlaminectomy syndrome, not elsewhere classified: Secondary | ICD-10-CM | POA: Diagnosis not present

## 2021-04-16 DIAGNOSIS — Z79899 Other long term (current) drug therapy: Secondary | ICD-10-CM | POA: Diagnosis not present

## 2021-04-16 DIAGNOSIS — M5134 Other intervertebral disc degeneration, thoracic region: Secondary | ICD-10-CM | POA: Diagnosis not present

## 2021-04-16 DIAGNOSIS — K5903 Drug induced constipation: Secondary | ICD-10-CM | POA: Diagnosis not present

## 2021-04-22 ENCOUNTER — Encounter: Payer: Self-pay | Admitting: Cardiovascular Disease

## 2021-04-22 ENCOUNTER — Ambulatory Visit: Payer: Medicare Other | Admitting: Cardiovascular Disease

## 2021-04-22 ENCOUNTER — Other Ambulatory Visit: Payer: Self-pay

## 2021-04-22 VITALS — BP 140/88 | HR 80 | Ht 62.0 in | Wt 206.1 lb

## 2021-04-22 DIAGNOSIS — I700 Atherosclerosis of aorta: Secondary | ICD-10-CM | POA: Diagnosis not present

## 2021-04-22 DIAGNOSIS — M059 Rheumatoid arthritis with rheumatoid factor, unspecified: Secondary | ICD-10-CM | POA: Diagnosis not present

## 2021-04-22 DIAGNOSIS — I2510 Atherosclerotic heart disease of native coronary artery without angina pectoris: Secondary | ICD-10-CM

## 2021-04-22 DIAGNOSIS — I7300 Raynaud's syndrome without gangrene: Secondary | ICD-10-CM

## 2021-04-22 DIAGNOSIS — G894 Chronic pain syndrome: Secondary | ICD-10-CM

## 2021-04-22 NOTE — Patient Instructions (Addendum)
Medication Instructions:  No changes, please continue your current medications   If you need a refill on your cardiac medications before your next appointment, please call your pharmacy.   Lab work: No new labs needed  Testing/Procedures: No new testing needed  Follow-Up: At [facility], you and your health needs are our priority.  As part of our continuing mission to provide you with exceptional heart care, we have created designated Provider Care Teams.  These Care Teams include your primary Cardiologist (physician) and Advanced Practice Providers (APPs -  Physician Assistants and Nurse Practitioners) who all work together to provide you with the care you need, when you need it.  You will need a follow up appointment as needed  Providers on your designated Care Team:   Murray Hodgkins, NP Christell Faith, PA-C Marrianne Mood, PA-C Cadence [place], [place]  COVID-19 Vaccine Information can be found at: [email or web address].uk For questions related to vaccine distribution or appointments, please email [email or web address] or call [phone].

## 2021-04-22 NOTE — Progress Notes (Signed)
Cardiology Office Note  Date:  04/22/2021   ID:  DARIN REDMANN, DOB March 20, 1951, MRN [id number]  PCP:  Abner Greenspan, MD   Chief Complaint  Patient presents with   Follow-up    Ref by Dr. Glori Bickers for evaluation of CAD with recent CT showing Aortic Atherosclerosis; last seen in 2019 by Dr. [name]. Medications reviewed by the patient verbally.     HPI:  Ms. Hon is a 70 year old woman with a history of  Raynaud's disease, takes diltiazem in the winter hyperlipidemia, LDL 149 HTN,  obesity, Will chronic lower extremity swelling   DM, HBA1C 6.3 CT scan chest 2013 with no plaque, no CAD who presents for followup of her palpitations and hypertension, edema.  LOV 06/2018 Discussion/today concerning her health over the past 3 years Worsening rheumatoid arthritis, has seen specialists, some medication intolerances Severely affecting right hand, leg swelling, other joints Affecting her mobility, very uncomfortable Seems to do well on prednisone  Some concern of inflammatory lung disease Denies significant symptoms  High resolution CT scan April 2022 images pulled up and reviewed Minimal aortic atherosclerosis in the arch , otherwise clean aorta Difficult images, unable to exclude very mild coronary calcification Report details interstitial lung disease  No regular exercise program  Cholesterol numbers down 50 points, tolerating Crestor 5 Reports blood pressure stable on metoprolol, low-dose ramipril  Takes torsemide sparingly for leg swelling Essentially normal renal function  EKG personally reviewed by myself on todays visit Normal sinus rhythm rate 80 bpm no significant ST-T wave changes  Other past medical history reviewed Goes to [facility] spine  fibromyalgia, takes pain medication  CT scan chest reviewed from 2013 showing no significant aortic atherosclerosis or coronary calcification  She continues to drink a significant amount of fluid during the daytime She  reports that on the metoprolol, her palpitations have been well-controlled.     Echocardiogram in October 2008 was essentially normal.   Stress test in October 2008 showed no ischemia. This is a exercise treadmill. She exercised for 4 minutes 30 seconds, peak heart rate of 152 beats per minute, peak blood pressure 172/94.    PMH:   has a past medical history of Acute renal insufficiency (05/31/2014), Anemia, iron deficiency, Anxiety, Bilateral lower extremity edema, Cervical dysplasia, Colon cancer screening (06/14/2014), Coronary artery disease, Cough (08/08/2012), Degenerative disk disease (11/19/2011), Depression, Diabetes mellitus type II, Diverticulosis, DJD (degenerative joint disease), Drug rash (05/22/2011), Dry eyes, Dysrhythmia, Edema, Elevated liver enzymes (03/21/2012), Encounter for routine gynecological examination (06/14/2014), ESOPHAGITIS (03/28/2007), Fibromyalgia, GASTRITIS (03/28/2007), GERD (gastroesophageal reflux disease), Hemorrhoids, HLD (hyperlipidemia), HNP (herniated nucleus pulposus) (11/1997), HTN (hypertension), Hyperglycemia (05/13/2008), Hypothyroidism, Interstitial lung disease ([place]), Left ovarian cyst, Osteoarthritis, Osteopenia, Other screening mammogram (08/18/2011), Palpitations, PERSONAL HISTORY ALLERGY UNSPEC MEDICINAL AGENT (03/18/2010), PONV (postoperative nausea and vomiting), Raynaud's disease, Recurrent HSV (herpes simplex virus), Rheumatoid arthritis ([place]), Rhinitis (03/21/2012), and Routine general medical examination at a health care facility (03/28/2011).  PSH:    Past Surgical History:  Procedure Laterality Date   ABDOMINAL EXPLORATION SURGERY     ACDF  1991   BIOPSY OF SKIN SUBCUTANEOUS TISSUE AND/OR MUCOUS MEMBRANE Left 04/08/2021   Procedure: BIOPSY OF SKIN SUBCUTANEOUS TISSUE AND/OR MUCOUS MEMBRANE ( REMOVAL SKIN NODULE);  Surgeon: Katha Cabal, MD;  Location: [facility] ORS;  Service: Vascular;  Laterality: Left;   COLONOSCOPY  08/2000    Diverticulosis; hemorrhoids   HAND SURGERY Left    left thumb. PIN REMOVED   LASIK     bilateral  Current Outpatient Medications  Medication Sig Dispense Refill   Acetaminophen (TYLENOL ARTHRITIS PAIN PO) Take 650 mg by mouth daily.     buPROPion (WELLBUTRIN XL) 150 MG 24 hr tablet Take 1 tablet (150 mg total) by mouth daily. 90 tablet 3   Chlorphen-Phenyleph-ASA (ALKA-SELTZER PLUS COLD PO) Take by mouth as needed.     cholecalciferol (VITAMIN D3) 25 MCG (1000 UNIT) tablet Take 1,000 Units by mouth daily.     cyclobenzaprine (FLEXERIL) 10 MG tablet TAKE 1/2 TO 1 TABLET BY  MOUTH ONCE DAILY AND 1  TABLET AT BEDTIME 180 tablet 1   Diclofenac Sodium 1.5 % SOLN Place 2 mLs onto the skin 4 (four) times daily. 3 Bottle 3   DULoxetine (CYMBALTA) 60 MG capsule Take 1 capsule (60 mg total) by mouth daily. 90 capsule 3   esomeprazole (NEXIUM) 20 MG capsule Take 1 capsule (20 mg total) by mouth daily. 90 capsule 3   famotidine (PEPCID) 40 MG tablet Take 1 tablet (40 mg total) by mouth daily. 90 tablet 3   ferrous sulfate 325 (65 FE) MG EC tablet Take 325 mg by mouth daily with breakfast.     glucose blood test strip One Touch Ultra stripts blue-To check sugar once daily and as needed for DM2 250.00 100 each 3   GUAIFENESIN CR PO Take by mouth as needed.     HYDROmorphone HCl (EXALGO) 12 MG T24A SR tablet Take 12 mg by mouth 2 (two) times daily.     hyoscyamine (LEVSIN SL) 0.125 MG SL tablet Take 1 tablet (0.125 mg total) by mouth every 4 (four) hours as needed for cramping. (Patient taking differently: Take 0.125 mg by mouth every 4 (four) hours as needed for cramping. Patient has tongue quivers at bedtime. Takes this for these spasms.) 270 tablet 3   ketoconazole (NIZORAL) 2 % shampoo SHAMPOO WITH A SMALL AMOUNT AS DIRECTED THREE TIMES A WEEK SHAMPOO SCALP 3 DAYS A WEEK, LET SHAMPOO SIT FOR 10 MINUTES AND RINSE OFF 120 mL 2   levothyroxine (SYNTHROID) 150 MCG tablet Take 1 tablet (150 mcg total) by  mouth daily. 90 tablet 3   LYSINE PO Take by mouth daily.     Magnesium 400 MG CAPS Take by mouth daily.     metFORMIN (GLUCOPHAGE) 500 MG tablet TAKE 1 TABLET BY MOUTH  TWICE DAILY WITH A MEAL 180 tablet 3   metoprolol succinate (TOPROL-XL) 50 MG 24 hr tablet TAKE 1 TABLET BY MOUTH  TWICE DAILY TAKE WITH OR  IMMEDIATELY FOLLOWING A  MEAL 180 tablet 3   oxyCODONE (OXYCONTIN) 10 MG 12 hr tablet Take 10 mg by mouth 3 (three) times daily as needed. Beakthrough pain     ramipril (ALTACE) 5 MG capsule Take 5 mg by mouth daily.     rosuvastatin (CRESTOR) 5 MG tablet TAKE 1 TABLET BY MOUTH  TWICE WEEKLY 26 tablet 3   torsemide (DEMADEX) 20 MG tablet TAKE 1 TABLET BY MOUTH  DAILY AS NEEDED 90 tablet 1   azaTHIOprine (IMURAN) 50 MG tablet Take 2 tablets by mouth daily. (Patient not taking: No sig reported) 120 tablet 0   ondansetron (ZOFRAN) 4 MG tablet Take 1 tablet (4 mg total) by mouth every 8 (eight) hours as needed for nausea or vomiting. (Patient not taking: Reported on 04/22/2021) 20 tablet 0   No current facility-administered medications for this visit.     Allergies:   Ceftin [cefuroxime], Clonidine derivatives, Erythromycin, Keflex [cephalexin], Penicillins, Sulfonamide derivatives, Ciprofloxacin,  Fluoxetine hcl, Furosemide, Gabapentin, Paroxetine, Pregabalin, Tetracycline, Venlafaxine, Etodolac, Amitriptyline hcl, Atorvastatin, Benicar [olmesartan medoxomil], Cephalexin, Cetirizine hcl, Diovan [valsartan], and Naproxen sodium   Social History:  The patient  reports that she quit smoking about 52 years ago. Her smoking use included cigarettes. She has never used smokeless tobacco. She reports that she does not drink alcohol and does not use drugs.   Family History:   family history includes Anemia in her brother; Coronary artery disease in her mother; Diabetes in her brother; Emphysema in her father; Heart disease in her brother; Iron deficiency in her daughter; Leukemia in her son; Lung cancer in  her mother; Lymphoma in her brother and sister; Parkinson's disease in her brother.    Review of Systems: Review of Systems  Constitutional: Negative.   Respiratory: Negative.    Cardiovascular: Negative.   Gastrointestinal: Negative.   Musculoskeletal: Negative.   Skin:        Raynauds  Neurological: Negative.   Psychiatric/Behavioral: Negative.    All other systems reviewed and are negative.   PHYSICAL EXAM: VS:  BP 140/88 (BP Location: Left Arm, Patient Position: Sitting, Cuff Size: Normal)   Pulse 80   Ht 5\' 2"  (1.575 m)   Wt 206 lb 2 oz (93.5 kg)   SpO2 95%   BMI 37.70 kg/m  , BMI Body mass index is 37.7 kg/m. Constitutional:  oriented to person, place, and time. No distress.  HENT:  Head: Grossly normal Eyes:  no discharge. No scleral icterus.  Neck: No JVD, no carotid bruits  Cardiovascular: Regular rate and rhythm, no murmurs appreciated Pulmonary/Chest: Clear to auscultation bilaterally, no wheezes or rails Abdominal: Soft.  no distension.  no tenderness.  Musculoskeletal: Normal range of motion Neurological:  normal muscle tone. Coordination normal. No atrophy Skin: Skin warm and dry Psychiatric: normal affect, pleasant  Recent Labs: 08/20/2020: TSH 0.36 03/24/2021: ALT 20 04/07/2021: BUN 34; Creatinine, Ser 0.91; Hemoglobin 11.1; Platelets 346; Potassium 4.5; Sodium 137    Lipid Panel Lab Results  Component Value Date   CHOL 168 08/20/2020   HDL 42.30 08/20/2020   LDLCALC 99 08/20/2020   TRIG 133.0 08/20/2020      Wt Readings from Last 3 Encounters:  04/22/21 206 lb 2 oz (93.5 kg)  04/08/21 197 lb 1.5 oz (89.4 kg)  04/06/21 197 lb (89.4 kg)     ASSESSMENT AND PLAN:   Mixed hyperlipidemia - Plan: EKG 12-Lead CT scan reviewed showing minimal coronary calcification, minimal aortic atherosclerosis Cholesterol is much improved on Crestor 5 mg twice a week, no changes made to her medications, no further cardiac testing needed  Leg swelling Chronic  issue, seems to have gotten worse in the past 3 years Likely multifactorial including weight gain, venous insufficiency, also exacerbation from her underlying arthritis Will continue to take torsemide sparingly  Essential hypertension - Plan: EKG 12-Lead Blood pressure is well controlled on today's visit. No changes made to the medications.  Type 2 diabetes mellitus without complication, without long-term current use of insulin ([place]) - Plan: EKG 12-Lead Calorie restriction recommended A1c relatively stable, 6.5  Raynaud's disease without gangrene Previously taking diltiazem in the winter, symptoms were tolerable in the summer New prescription can be sent in if needed     Total encounter time more than 35 minutes  Greater than 50% was spent in counseling and coordination of care with the patient    Orders Placed This Encounter  Procedures   EKG 12-Lead     Signed, Octavia Bruckner  [name], M.D., Ph.D. 04/22/2021  [place], [place]

## 2021-04-23 ENCOUNTER — Ambulatory Visit (INDEPENDENT_AMBULATORY_CARE_PROVIDER_SITE_OTHER): Payer: Medicare Other | Admitting: Vascular Surgery

## 2021-04-23 ENCOUNTER — Encounter (INDEPENDENT_AMBULATORY_CARE_PROVIDER_SITE_OTHER): Payer: Self-pay | Admitting: Vascular Surgery

## 2021-04-23 VITALS — BP 116/74 | HR 80 | Resp 16 | Wt 204.0 lb

## 2021-04-23 DIAGNOSIS — E1169 Type 2 diabetes mellitus with other specified complication: Secondary | ICD-10-CM

## 2021-04-23 DIAGNOSIS — I2510 Atherosclerotic heart disease of native coronary artery without angina pectoris: Secondary | ICD-10-CM

## 2021-04-23 DIAGNOSIS — E785 Hyperlipidemia, unspecified: Secondary | ICD-10-CM

## 2021-04-23 DIAGNOSIS — I7300 Raynaud's syndrome without gangrene: Secondary | ICD-10-CM

## 2021-04-23 DIAGNOSIS — Q2730 Arteriovenous malformation, site unspecified: Secondary | ICD-10-CM | POA: Diagnosis not present

## 2021-04-23 DIAGNOSIS — I10 Essential (primary) hypertension: Secondary | ICD-10-CM

## 2021-04-23 MED ORDER — PREDNISONE 5 MG PO TABS: ORAL_TABLET | ORAL | 0 refills | Status: DC

## 2021-04-23 NOTE — Progress Notes (Signed)
MRN : [id number]  Tina Berger is a 70 y.o. ([birth year]) female who presents with chief complaint of No chief complaint on file. Marland Kitchen  History of Present Illness:   Patient returns the office status post removal of AV malformation left leg.  She denies pain or drainage at the surgery site  Path is consistent with a benign AV malformation  No outpatient medications have been marked as taking for the 04/23/21 encounter (Appointment) with Delana Meyer, Dolores Lory, MD.    Past Medical History:  Diagnosis Date   Acute renal insufficiency 05/31/2014   pt not aware of this diagnosis   Anemia, iron deficiency    Anxiety    Bilateral lower extremity edema    a. uses torsemide   Cervical dysplasia    abnormal paps   Colon cancer screening 06/14/2014   Coronary artery disease    Cough 08/08/2012   Degenerative disk disease 11/19/2011   Depression    Diabetes mellitus type II    Diverticulosis    DJD (degenerative joint disease)    Drug rash 05/22/2011   Dry eyes    Dysrhythmia    metoprolol.   Edema    Elevated liver enzymes 03/21/2012   Encounter for routine gynecological examination 06/14/2014   ESOPHAGITIS 03/28/2007   Qualifier: Hospitalized for  By: Marcelino Scot CMA, [place] Bilberry     Fibromyalgia    GASTRITIS 03/28/2007   Qualifier: History of  By: Marcelino Scot CMA, [place] Bilberry     GERD (gastroesophageal reflux disease)    Hemorrhoids    external   HLD (hyperlipidemia)    HNP (herniated nucleus pulposus) 11/1997   T6,7,8 with DJD   HTN (hypertension)    Hyperglycemia 05/13/2008   Qualifier: Diagnosis of  By: Glori Bickers MD, Carmell Austria    Hypothyroidism    Interstitial lung disease ([place])    pt not aware of this   Left ovarian cyst    x 3, rupture   Osteoarthritis    hands   Osteopenia    mild-11/01; improved 12/05   Other screening mammogram 08/18/2011   Palpitations    PERSONAL HISTORY ALLERGY UNSPEC MEDICINAL AGENT 03/18/2010   Qualifier: Diagnosis of  By: Glori Bickers MD, Carmell Austria     PONV (postoperative nausea and vomiting)    Raynaud's disease    Recurrent HSV (herpes simplex virus)    lesions in nose or mouth with frequent ulcers. d/t meds and dry mouth   Rheumatoid arthritis ([place])    Rhinitis 03/21/2012   Routine general medical examination at a health care facility 03/28/2011    Past Surgical History:  Procedure Laterality Date   ABDOMINAL EXPLORATION SURGERY     ACDF  1991   BIOPSY OF SKIN SUBCUTANEOUS TISSUE AND/OR MUCOUS MEMBRANE Left 04/08/2021   Procedure: BIOPSY OF SKIN SUBCUTANEOUS TISSUE AND/OR MUCOUS MEMBRANE ( REMOVAL SKIN NODULE);  Surgeon: Katha Cabal, MD;  Location: [facility] ORS;  Service: Vascular;  Laterality: Left;   COLONOSCOPY  08/2000   Diverticulosis; hemorrhoids   HAND SURGERY Left    left thumb. PIN REMOVED   LASIK     bilateral    Social History Social History   Tobacco Use   Smoking status: Former    Pack years: 0.00    Types: Cigarettes    Quit date: 1970    Years since quitting: 52.5   Smokeless tobacco: Never   Tobacco comments:    quit over 40 years  Vaping Use   Vaping Use: Never  used  Substance Use Topics   Alcohol use: No    Alcohol/week: 0.0 standard drinks   Drug use: No    Family History Family History  Problem Relation Age of Onset   Emphysema Father        + smoker   Lung cancer Mother        + smoker   Coronary artery disease Mother        relatively young   Lymphoma Sister    Heart disease Brother    Lymphoma Brother    Parkinson's disease Brother    Anemia Brother        aplastic    Diabetes Brother    Leukemia Son    Iron deficiency Daughter    Breast cancer Neg Hx     Allergies  Allergen Reactions   Ceftin [Cefuroxime] Swelling    Swelling, "legs turn blue".  Legs swelling, then blue, then a rash   Clonidine Derivatives     Swelling of lips   Erythromycin Swelling    Rash, swollen gums    Keflex [Cephalexin] Anaphylaxis    Chest was broken out in a rash. Lips were  swelling. Took a few days to occur, but it kept getting worse.                                                                                                                                                                                                   Penicillins Anaphylaxis    Pt had a daughter with an allergy to this at a young age. Patient developed blisters anywhere her child touched her. Also, if she used the bathroom after daughter did while on keflex, it would cause a reaction for her. Lips and roof of mouth swelled up also.   Sulfonamide Derivatives Swelling    Rash, swollen gums, lips   Ciprofloxacin     REACTION: ? rash vs sun rxn. Rash    Fluoxetine Hcl     REACTION: stomach problems. Severe pain in abdomen. Could not function d/t pain   Furosemide Swelling    REACTION: swelling worsened in legs once taking   Gabapentin Other (See Comments)    REACTION: edema of feet. Unable to get shoes on   Paroxetine     REACTION: weight gain (30 pound weight gain   Pregabalin     REACTION: swelling of feet and legs. Unable to get shoes on.   Tetracycline     REACTION:inflammed genitals.    Venlafaxine     REACTION: sweating profusely   Etodolac     REACTION:  reaction not known   Amitriptyline Hcl     REACTION: sedating   Atorvastatin     REACTION: muscle twitch and pain   Benicar [Olmesartan Medoxomil]     Muscle pain    Cephalexin Hives and Rash   Cetirizine Hcl     REACTION: headache   Diovan [Valsartan]     Thought it made her feel confused   Naproxen Sodium Other (See Comments)    REACTION: edema of feet and legs.  Not good for ckd     REVIEW OF SYSTEMS (Negative unless checked)  Constitutional: [] Weight loss  [] Fever  [] Chills Cardiac: [] Chest pain   [] Chest pressure   [] Palpitations   [] Shortness of breath when laying flat   [] Shortness of breath with exertion. Vascular:  [] Pain in legs with walking   [] Pain in legs at rest  [] History of DVT   [] Phlebitis    [] Swelling in legs   [] Varicose veins   [] Non-healing ulcers Pulmonary:   [] Uses home oxygen   [] Productive cough   [] Hemoptysis   [] Wheeze  [] COPD   [] Asthma Neurologic:  [] Dizziness   [] Seizures   [] History of stroke   [] History of TIA  [] Aphasia   [] Vissual changes   [] Weakness or numbness in arm   [] Weakness or numbness in leg Musculoskeletal:   [] Joint swelling   [] Joint pain   [] Low back pain Hematologic:  [] Easy bruising  [] Easy bleeding   [] Hypercoagulable state   [] Anemic Gastrointestinal:  [] Diarrhea   [] Vomiting  [] Gastroesophageal reflux/heartburn   [] Difficulty swallowing. Genitourinary:  [] Chronic kidney disease   [] Difficult urination  [] Frequent urination   [] Blood in urine Skin:  [] Rashes   [] Ulcers  Psychological:  [] History of anxiety   []  History of major depression.  Physical Examination  There were no vitals filed for this visit. There is no height or weight on file to calculate BMI. Gen: WD/WN, NAD Head: [place]/AT, No temporalis wasting.  Ear/Nose/Throat: Hearing grossly intact, nares w/o erythema or drainage Eyes: PER, EOMI, sclera nonicteric.  Neck: Supple, no large masses.   Pulmonary:  Good air movement, no audible wheezing bilaterally, no use of accessory muscles.  Cardiac: RRR, no JVD Vascular:  2 mm separation of the incision which is CD&uninfected Vessel Right Left  Radial Palpable Palpable  PT Palpable Palpable  DP Palpable Palpable  Gastrointestinal: Non-distended. No guarding/no peritoneal signs.  Musculoskeletal: M/S 5/5 throughout.  No deformity or atrophy.  Neurologic: CN 2-12 intact. Symmetrical.  Speech is fluent. Motor exam as listed above. Psychiatric: Judgment intact, Mood & affect appropriate for pt's clinical situation. Dermatologic: No rashes or ulcers noted.  No changes consistent with cellulitis. Lymph : No lichenification or skin changes of chronic lymphedema.  CBC Lab Results  Component Value Date   WBC 8.9 04/07/2021   HGB 11.1 (L)  04/07/2021   HCT 34.5 (L) 04/07/2021   MCV 92.2 04/07/2021   PLT 346 04/07/2021    BMET    Component Value Date/Time   NA 137 04/07/2021 1015   NA 141 02/06/2021 1109   NA 135 (L) 03/06/2012 0546   K 4.5 04/07/2021 1015   K 4.9 03/06/2012 0546   CL 101 04/07/2021 1015   CL 99 03/06/2012 0546   CO2 30 04/07/2021 1015   CO2 28 03/06/2012 0546   GLUCOSE 135 (H) 04/07/2021 1015   GLUCOSE 138 (H) 03/06/2012 0546   BUN 34 (H) 04/07/2021 1015   BUN 17 02/06/2021 1109   BUN 21 (H) 03/06/2012 0546  CREATININE 0.91 04/07/2021 1015   CREATININE 0.90 03/24/2021 0930   CALCIUM 8.7 (L) 04/07/2021 1015   CALCIUM 8.4 (L) 03/06/2012 0546   GFRNONAA >60 04/07/2021 1015   GFRNONAA 64 03/24/2021 0930   GFRAA 75 03/24/2021 0930   Estimated Creatinine Clearance: 60.4 mL/min (by C-G formula based on SCr of 0.91 mg/dL).  COAG No results found for: INR, PROTIME  Radiology VAS [place] LOWER EXTREMITY VENOUS (DVT)  Result Date: 04/06/2021  Lower Venous DVT Study Patient Name:  AMERY VANDENBOS  Date of Exam:   04/06/2021 Medical Rec #: [id number]       Accession #:    [id number] Date of Birth: 07/03/1951       Patient Gender: F Patient Age:   59Y Exam Location:  [facility] Procedure:      VAS [place] LOWER EXTREMITY VENOUS (DVT) Referring Phys: [id number] [place] --------------------------------------------------------------------------------  Other Indications: ? avm left ankle. Performing Technologist: Concha Norway RVT  Examination Guidelines: A complete evaluation includes B-mode imaging, spectral Doppler, color Doppler, and power Doppler as needed of all accessible portions of each vessel. Bilateral testing is considered an integral part of a complete examination. Limited examinations for reoccurring indications may be performed as noted. The reflux portion of the exam is performed with the patient in reverse Trendelenburg.    Summary: LEFT: - No true AVM visualized by ultrasound. Left ATA  near bleed site with no arterial branch seen. ATV is wideky patent with no pulsatility. Small vein branch seen with minimal pulsatility could be due to adjacent ATA.  *See table(s) above for measurements and observations. Electronically signed by Hortencia Pilar MD on 04/06/2021 at 4:32:52 PM.    Final      Assessment/Plan 1. AVM (arteriovenous malformation) Doing well surgery site is clean    Apply bacitracin bid   Follow up in 4 weeks  2. Raynaud's disease without gangrene The patient's Raynaud's is better and her symptoms are stable.  Behavioral modification was stressed; avoidance of cold and utilizing wool socks was reviewed again.   3. Coronary artery disease involving native coronary artery of native heart without angina pectoris Continue cardiac and antihypertensive medications as already ordered and reviewed, no changes at this time.  Continue statin as ordered and reviewed, no changes at this time  Nitrates PRN for chest pain   4. Primary hypertension Continue antihypertensive medications as already ordered, these medications have been reviewed and there are no changes at this time.   5. Hyperlipidemia associated with type 2 diabetes mellitus ([place]) Continue statin as ordered and reviewed, no changes at this time   Hortencia Pilar, MD  04/23/2021 8:41 AM

## 2021-04-23 NOTE — Telephone Encounter (Signed)
Ok to send in prednisone taper starting at 10 mg tapering by 2.5 mg every 4 days.  Advise the patient to take prednisone in the morning with food.  Avoid all NSAIDs. Please advised the patient to monitor her blood sugar closely while taking prednisone.   If she continues to have recurrent flares please schedule the patient for a sooner follow up visit.

## 2021-04-25 ENCOUNTER — Encounter (INDEPENDENT_AMBULATORY_CARE_PROVIDER_SITE_OTHER): Payer: Self-pay | Admitting: Vascular Surgery

## 2021-04-26 ENCOUNTER — Other Ambulatory Visit: Payer: Self-pay | Admitting: Rheumatology

## 2021-04-27 NOTE — Progress Notes (Deleted)
Office Visit Note  Patient: Tina Berger             Date of Birth: February 05, 1951           MRN: [id number]             PCP: Abner Greenspan, MD Referring: Tower, [name], MD Visit Date: 05/06/2021 Occupation: @GUAROCC @  Subjective:  No chief complaint on file.   History of Present Illness: Tina Berger is a 70 y.o. female ***   Activities of Daily Living:  Patient reports morning stiffness for *** {minute/hour:19697}.   Patient {ACTIONS;DENIES/REPORTS:21021675::"Denies"} nocturnal pain.  Difficulty dressing/grooming: {ACTIONS;DENIES/REPORTS:21021675::"Denies"} Difficulty climbing stairs: {ACTIONS;DENIES/REPORTS:21021675::"Denies"} Difficulty getting out of chair: {ACTIONS;DENIES/REPORTS:21021675::"Denies"} Difficulty using hands for taps, buttons, cutlery, and/or writing: {ACTIONS;DENIES/REPORTS:21021675::"Denies"}  No Rheumatology ROS completed.   PMFS History:  Patient Active Problem List   Diagnosis Date Noted   AVM (arteriovenous malformation) 04/04/2021   CAD (coronary artery disease) 01/22/2021   Primary osteoarthritis of both knees 01/12/2021   Positive ANA (antinuclear antibody) 11/17/2020   Joint pain 11/13/2020   Elevated serum creatinine 08/26/2020   Current use of proton pump inhibitor 08/26/2020   Estrogen deficiency 08/21/2018   TMJ (dislocation of temporomandibular joint), initial encounter 05/05/2018   Anterior neck pain 05/05/2018   Chronic pain syndrome 11/09/2017   Chronic upper extremity pain [facility] Area of Pain) (Bilateral) (L>R) 11/09/2017   Fibromyalgia syndrome 11/09/2017   Osteoarthritis 11/09/2017   Osteoarthritis of lumbar facet joint (Bilateral) 11/09/2017   Lumbar facet arthropathy (Bilateral) 11/09/2017   Lumbar facet syndrome (Bilateral) (L>R) 11/09/2017   Lumbar foraminal stenosis (multilevel) (Bilateral) 11/09/2017   DDD (degenerative disc disease), lumbar 11/09/2017   Thoracic levoscoliosis 11/09/2017   Thoracic facet syndrome  (Bilateral) (L>R) 11/09/2017   Thoracic facet arthropathy (Bilateral) (R>L) 11/09/2017   DDD (degenerative disc disease), thoracic 11/09/2017   Thoracolumbar IVDD 11/09/2017   DDD (degenerative disc disease), cervical 11/09/2017   Osteoarthritis of cervical facet (Bilateral) 11/09/2017   Grade 1 Anterolisthesis of C7 over T1 11/09/2017   Cervical foraminal stenosis (Bilateral) 11/09/2017   History of fusion of cervical spine (C5-6 ACDF) 11/09/2017   Cervical facet syndrome (Bilateral) 11/09/2017   Disorder of skeletal system 11/09/2017   Cervical radiculitis (Bilateral) 11/09/2017   Lumbar Epidural lipomatosis 11/09/2017   Chronic sacroiliac joint pain (Bilateral) (L>R) 11/09/2017   Chronic hip pain (Bilateral) (L>R) 11/09/2017   Chronic upper back pain (Primary Area of Pain) (midline) 10/24/2017   Chronic neck pain (Secondary Area of Pain) (Bilateral)  (L>R) 10/24/2017   Chronic low back pain (Fourth Area of Pain) (Bilateral) (L>R) 10/24/2017   Chronic lower extremity pain (Fifth Area of Pain) (Bilateral) (L>R) 10/24/2017   Lumbar Grade 1 Retrolisthesis of L1-2 and L2-3 10/24/2017   Other long term (current) drug therapy 10/24/2017   Other specified health status 10/24/2017   Long term current use of opiate analgesic 10/24/2017   Long term prescription opiate use 10/24/2017   Opiate use 10/24/2017   DM type 2 (diabetes mellitus, type 2) ([place]) 06/14/2014   Pharmacologic therapy 06/14/2014   Pedal edema    Post-menopausal 06/23/2012   Lumbar disc disease with radiculopathy 11/18/2011   HTN (hypertension) 07/30/2011   Raynaud disease 07/30/2011   Obesity 07/30/2011   Routine general medical examination at a health care facility 03/28/2011   Palpitations 04/27/2010   Problems influencing health status 12/11/2009   Depression with anxiety 06/14/2008   Vitamin D deficiency 05/13/2008   ANXIETY 03/25/2008   Osteopenia 03/25/2008  Hypothyroidism 03/28/2007   Hyperlipidemia  associated with type 2 diabetes mellitus ([place]) 03/28/2007   Other iron deficiency anemias 03/28/2007   PANIC ATTACK 03/28/2007   KERATOCONJUNCTIVITIS SICCA 03/28/2007   Mitral valve disorder 03/28/2007   ABNORMAL HEART RHYTHMS 03/28/2007   Raynaud's syndrome 03/28/2007   GERD 03/28/2007   IBS 03/28/2007   Rosacea 03/28/2007   PLANTAR FASCIITIS 03/28/2007   MIGRAINES, HX OF 03/28/2007    Past Medical History:  Diagnosis Date   Acute renal insufficiency 05/31/2014   pt not aware of this diagnosis   Anemia, iron deficiency    Anxiety    Bilateral lower extremity edema    a. uses torsemide   Cervical dysplasia    abnormal paps   Colon cancer screening 06/14/2014   Coronary artery disease    Cough 08/08/2012   Degenerative disk disease 11/19/2011   Depression    Diabetes mellitus type II    Diverticulosis    DJD (degenerative joint disease)    Drug rash 05/22/2011   Dry eyes    Dysrhythmia    metoprolol.   Edema    Elevated liver enzymes 03/21/2012   Encounter for routine gynecological examination 06/14/2014   ESOPHAGITIS 03/28/2007   Qualifier: Hospitalized for  By: Marcelino Scot CMA, [place] Bilberry     Fibromyalgia    GASTRITIS 03/28/2007   Qualifier: History of  By: Marcelino Scot CMA, [place] Bilberry     GERD (gastroesophageal reflux disease)    Hemorrhoids    external   HLD (hyperlipidemia)    HNP (herniated nucleus pulposus) 11/1997   T6,7,8 with DJD   HTN (hypertension)    Hyperglycemia 05/13/2008   Qualifier: Diagnosis of  By: Glori Bickers MD, Carmell Austria    Hypothyroidism    Interstitial lung disease ([place])    pt not aware of this   Left ovarian cyst    x 3, rupture   Osteoarthritis    hands   Osteopenia    mild-11/01; improved 12/05   Other screening mammogram 08/18/2011   Palpitations    PERSONAL HISTORY ALLERGY UNSPEC MEDICINAL AGENT 03/18/2010   Qualifier: Diagnosis of  By: Glori Bickers MD, Carmell Austria    PONV (postoperative nausea and vomiting)    Raynaud's disease    Recurrent  HSV (herpes simplex virus)    lesions in nose or mouth with frequent ulcers. d/t meds and dry mouth   Rheumatoid arthritis ([place])    Rhinitis 03/21/2012   Routine general medical examination at a health care facility 03/28/2011    Family History  Problem Relation Age of Onset   Emphysema Father        + smoker   Lung cancer Mother        + smoker   Coronary artery disease Mother        relatively young   Lymphoma Sister    Heart disease Brother    Lymphoma Brother    Parkinson's disease Brother    Anemia Brother        aplastic    Diabetes Brother    Leukemia Son    Iron deficiency Daughter    Breast cancer Neg Hx    Past Surgical History:  Procedure Laterality Date   ABDOMINAL EXPLORATION SURGERY     ACDF  1991   BIOPSY OF SKIN SUBCUTANEOUS TISSUE AND/OR MUCOUS MEMBRANE Left 04/08/2021   Procedure: BIOPSY OF SKIN SUBCUTANEOUS TISSUE AND/OR MUCOUS MEMBRANE ( REMOVAL SKIN NODULE);  Surgeon: Katha Cabal, MD;  Location: [facility] ORS;  Service:  Vascular;  Laterality: Left;   COLONOSCOPY  08/2000   Diverticulosis; hemorrhoids   HAND SURGERY Left    left thumb. PIN REMOVED   LASIK     bilateral   Social History   Social History Narrative   Married1 Investment banker, corporate to dean at [employer] regularly exerciseDaily caffeine use: 2/day.   Lives with husband. Feels safe in her home.   Immunization History  Administered Date(s) Administered   Fluad Quad(high Dose 65+) 08/07/2020   Influenza Split 06/19/2011   Influenza Whole 08/01/2009   Influenza, High Dose Seasonal PF 08/14/2018, 07/03/2019   Influenza,inj,Quad PF,6+ Mos 06/14/2014, 08/25/2015, 07/26/2016, 08/19/2017   Moderna Sars-Covid-2 Vaccination 09/17/2020   PFIZER(Purple Top)SARS-COV-2 Vaccination 11/27/2019, 12/18/2019   Pneumococcal Conjugate-13 08/25/2015   Pneumococcal Polysaccharide-23 08/25/2016   Td 11/25/2003   Tdap 06/14/2014   Zoster, Live 09/16/2014     Objective: Vital Signs: There were no vitals taken  for this visit.   Physical Exam   Musculoskeletal Exam: ***  CDAI Exam: CDAI Score: -- Patient Global: --; Provider Global: -- Swollen: --; Tender: -- Joint Exam 05/06/2021   No joint exam has been documented for this visit   There is currently no information documented on the homunculus. Go to the Rheumatology activity and complete the homunculus joint exam.  Investigation: No additional findings.  Imaging: VAS [place] LOWER EXTREMITY VENOUS (DVT)  Result Date: 04/06/2021  Lower Venous DVT Study Patient Name:  LEWANDA PEREA  Date of Exam:   04/06/2021 Medical Rec #: [id number]       Accession #:    [id number] Date of Birth: [birth year]       Patient Gender: F Patient Age:   14Y Exam Location:  [facility] Procedure:      VAS [place] LOWER EXTREMITY VENOUS (DVT) Referring Phys: [id number] [place] --------------------------------------------------------------------------------  Other Indications: ? avm left ankle. Performing Technologist: Concha Norway RVT  Examination Guidelines: A complete evaluation includes B-mode imaging, spectral Doppler, color Doppler, and power Doppler as needed of all accessible portions of each vessel. Bilateral testing is considered an integral part of a complete examination. Limited examinations for reoccurring indications may be performed as noted. The reflux portion of the exam is performed with the patient in reverse Trendelenburg.    Summary: LEFT: - No true AVM visualized by ultrasound. Left ATA near bleed site with no arterial branch seen. ATV is wideky patent with no pulsatility. Small vein branch seen with minimal pulsatility could be due to adjacent ATA.  *See table(s) above for measurements and observations. Electronically signed by Hortencia Pilar MD on 04/06/2021 at 4:32:52 PM.    Final     Recent Labs: Lab Results  Component Value Date   WBC 8.9 04/07/2021   HGB 11.1 (L) 04/07/2021   PLT 346 04/07/2021   NA 137 04/07/2021   K 4.5 04/07/2021    CL 101 04/07/2021   CO2 30 04/07/2021   GLUCOSE 135 (H) 04/07/2021   BUN 34 (H) 04/07/2021   CREATININE 0.91 04/07/2021   BILITOT 0.3 03/24/2021   ALKPHOS 81 02/06/2021   AST 20 03/24/2021   ALT 20 03/24/2021   PROT 7.4 03/24/2021   ALBUMIN 4.6 02/06/2021   CALCIUM 8.7 (L) 04/07/2021   GFRAA 75 03/24/2021   QFTBGOLDPLUS NEGATIVE 01/13/2021    Speciality Comments: No specialty comments available.  Procedures:  No procedures performed Allergies: Ceftin [cefuroxime], Clonidine derivatives, Erythromycin, Keflex [cephalexin], Penicillins, Sulfonamide derivatives, Ciprofloxacin, Fluoxetine hcl, Furosemide, Gabapentin, Paroxetine, Pregabalin, Tetracycline, Venlafaxine,  Etodolac, Amitriptyline hcl, Atorvastatin, Benicar [olmesartan medoxomil], Cephalexin, Cetirizine hcl, Diovan [valsartan], and Naproxen sodium   Assessment / Plan:     Visit Diagnoses: No diagnosis found.  Orders: No orders of the defined types were placed in this encounter.  No orders of the defined types were placed in this encounter.   Face-to-face time spent with patient was *** minutes. Greater than 50% of time was spent in counseling and coordination of care.  Follow-Up Instructions: No follow-ups on file.   Earnestine Mealing, CMA  Note - This record has been created using Editor, commissioning.  Chart creation errors have been sought, but may not always  have been located. Such creation errors do not reflect on  the standard of medical care.

## 2021-04-29 ENCOUNTER — Ambulatory Visit: Payer: Medicare Other | Admitting: Primary Care

## 2021-05-06 ENCOUNTER — Ambulatory Visit: Payer: Medicare Other | Admitting: Physician Assistant

## 2021-05-11 ENCOUNTER — Encounter (INDEPENDENT_AMBULATORY_CARE_PROVIDER_SITE_OTHER): Payer: Self-pay

## 2021-05-19 ENCOUNTER — Encounter: Payer: Self-pay | Admitting: Family Medicine

## 2021-05-21 ENCOUNTER — Other Ambulatory Visit: Payer: Self-pay

## 2021-05-21 ENCOUNTER — Ambulatory Visit (INDEPENDENT_AMBULATORY_CARE_PROVIDER_SITE_OTHER): Payer: Medicare Other | Admitting: Vascular Surgery

## 2021-05-21 ENCOUNTER — Encounter (INDEPENDENT_AMBULATORY_CARE_PROVIDER_SITE_OTHER): Payer: Self-pay | Admitting: Vascular Surgery

## 2021-05-21 VITALS — BP 116/72 | HR 90 | Resp 16 | Wt 200.8 lb

## 2021-05-21 DIAGNOSIS — I10 Essential (primary) hypertension: Secondary | ICD-10-CM

## 2021-05-21 DIAGNOSIS — I2510 Atherosclerotic heart disease of native coronary artery without angina pectoris: Secondary | ICD-10-CM | POA: Diagnosis not present

## 2021-05-21 DIAGNOSIS — Q2730 Arteriovenous malformation, site unspecified: Secondary | ICD-10-CM | POA: Diagnosis not present

## 2021-05-21 DIAGNOSIS — E119 Type 2 diabetes mellitus without complications: Secondary | ICD-10-CM | POA: Diagnosis not present

## 2021-05-21 DIAGNOSIS — I872 Venous insufficiency (chronic) (peripheral): Secondary | ICD-10-CM | POA: Diagnosis not present

## 2021-05-21 NOTE — Progress Notes (Signed)
MRN : [id number]  Tina Berger is a 70 y.o. (Oct 05, 1951) female who presents with chief complaint of No chief complaint on file. Marland Kitchen  History of Present Illness:   Patient returns the office status post removal of AV malformation left leg.   She denies pain or drainage at the surgery site but she notes the wound seems a little larger   Path is consistent with a benign AV malformation  No outpatient medications have been marked as taking for the 05/21/21 encounter (Appointment) with Delana Meyer, Dolores Lory, MD.    Past Medical History:  Diagnosis Date   Acute renal insufficiency 05/31/2014   pt not aware of this diagnosis   Anemia, iron deficiency    Anxiety    Bilateral lower extremity edema    a. uses torsemide   Cervical dysplasia    abnormal paps   Colon cancer screening 06/14/2014   Coronary artery disease    Cough 08/08/2012   Degenerative disk disease 11/19/2011   Depression    Diabetes mellitus type II    Diverticulosis    DJD (degenerative joint disease)    Drug rash 05/22/2011   Dry eyes    Dysrhythmia    metoprolol.   Edema    Elevated liver enzymes 03/21/2012   Encounter for routine gynecological examination 06/14/2014   ESOPHAGITIS 03/28/2007   Qualifier: Hospitalized for  By: Marcelino Scot CMA, [place] Bilberry     Fibromyalgia    GASTRITIS 03/28/2007   Qualifier: History of  By: Marcelino Scot CMA, [place] Bilberry     GERD (gastroesophageal reflux disease)    Hemorrhoids    external   HLD (hyperlipidemia)    HNP (herniated nucleus pulposus) 11/1997   T6,7,8 with DJD   HTN (hypertension)    Hyperglycemia 05/13/2008   Qualifier: Diagnosis of  By: Glori Bickers MD, Carmell Austria    Hypothyroidism    Interstitial lung disease ([place])    pt not aware of this   Left ovarian cyst    x 3, rupture   Osteoarthritis    hands   Osteopenia    mild-11/01; improved 12/05   Other screening mammogram 08/18/2011   Palpitations    PERSONAL HISTORY ALLERGY UNSPEC MEDICINAL AGENT 03/18/2010    Qualifier: Diagnosis of  By: Glori Bickers MD, Carmell Austria    PONV (postoperative nausea and vomiting)    Raynaud's disease    Recurrent HSV (herpes simplex virus)    lesions in nose or mouth with frequent ulcers. d/t meds and dry mouth   Rheumatoid arthritis ([place])    Rhinitis 03/21/2012   Routine general medical examination at a health care facility 03/28/2011    Past Surgical History:  Procedure Laterality Date   ABDOMINAL EXPLORATION SURGERY     ACDF  1991   BIOPSY OF SKIN SUBCUTANEOUS TISSUE AND/OR MUCOUS MEMBRANE Left 04/08/2021   Procedure: BIOPSY OF SKIN SUBCUTANEOUS TISSUE AND/OR MUCOUS MEMBRANE ( REMOVAL SKIN NODULE);  Surgeon: Katha Cabal, MD;  Location: [facility] ORS;  Service: Vascular;  Laterality: Left;   COLONOSCOPY  08/2000   Diverticulosis; hemorrhoids   HAND SURGERY Left    left thumb. PIN REMOVED   LASIK     bilateral    Social History Social History   Tobacco Use   Smoking status: Former    Types: Cigarettes    Quit date: 1970    Years since quitting: 52.6   Smokeless tobacco: Never   Tobacco comments:    quit over 40 years  Vaping Use  Vaping Use: Never used  Substance Use Topics   Alcohol use: No    Alcohol/week: 0.0 standard drinks   Drug use: No    Family History Family History  Problem Relation Age of Onset   Emphysema Father        + smoker   Lung cancer Mother        + smoker   Coronary artery disease Mother        relatively young   Lymphoma Sister    Heart disease Brother    Lymphoma Brother    Parkinson's disease Brother    Anemia Brother        aplastic    Diabetes Brother    Leukemia Son    Iron deficiency Daughter    Breast cancer Neg Hx     Allergies  Allergen Reactions   Ceftin [Cefuroxime] Swelling    Swelling, "legs turn blue".  Legs swelling, then blue, then a rash   Clonidine Derivatives     Swelling of lips   Erythromycin Swelling    Rash, swollen gums    Keflex [Cephalexin] Anaphylaxis    Chest was broken out  in a rash. Lips were swelling. Took a few days to occur, but it kept getting worse.                                                                                                                                                                                                   Penicillins Anaphylaxis    Pt had a daughter with an allergy to this at a young age. Patient developed blisters anywhere her child touched her. Also, if she used the bathroom after daughter did while on keflex, it would cause a reaction for her. Lips and roof of mouth swelled up also.   Sulfonamide Derivatives Swelling    Rash, swollen gums, lips   Ciprofloxacin     REACTION: ? rash vs sun rxn. Rash    Fluoxetine Hcl     REACTION: stomach problems. Severe pain in abdomen. Could not function d/t pain   Furosemide Swelling    REACTION: swelling worsened in legs once taking   Gabapentin Other (See Comments)    REACTION: edema of feet. Unable to get shoes on   Paroxetine     REACTION: weight gain (30 pound weight gain   Pregabalin     REACTION: swelling of feet and legs. Unable to get shoes on.   Tetracycline     REACTION:inflammed genitals.    Venlafaxine     REACTION: sweating profusely   Etodolac  REACTION: reaction not known   Amitriptyline Hcl     REACTION: sedating   Atorvastatin     REACTION: muscle twitch and pain   Benicar [Olmesartan Medoxomil]     Muscle pain    Cephalexin Hives and Rash   Cetirizine Hcl     REACTION: headache   Diovan [Valsartan]     Thought it made her feel confused   Naproxen Sodium Other (See Comments)    REACTION: edema of feet and legs.  Not good for ckd     REVIEW OF SYSTEMS (Negative unless checked)  Constitutional: '[]'$ Weight loss  '[]'$ Fever  '[]'$ Chills Cardiac: '[]'$ Chest pain   '[]'$ Chest pressure   '[]'$ Palpitations   '[]'$ Shortness of breath when laying flat   '[]'$ Shortness of breath with exertion. Vascular:  '[]'$ Pain in legs with walking   '[]'$ Pain in legs at rest  '[]'$ History of DVT    '[]'$ Phlebitis   '[]'$ Swelling in legs   '[]'$ Varicose veins   '[x]'$ Non-healing ulcers Pulmonary:   '[]'$ Uses home oxygen   '[]'$ Productive cough   '[]'$ Hemoptysis   '[]'$ Wheeze  '[]'$ COPD   '[]'$ Asthma Neurologic:  '[]'$ Dizziness   '[]'$ Seizures   '[]'$ History of stroke   '[]'$ History of TIA  '[]'$ Aphasia   '[]'$ Vissual changes   '[]'$ Weakness or numbness in arm   '[]'$ Weakness or numbness in leg Musculoskeletal:   '[]'$ Joint swelling   '[]'$ Joint pain   '[]'$ Low back pain Hematologic:  '[]'$ Easy bruising  '[]'$ Easy bleeding   '[]'$ Hypercoagulable state   '[]'$ Anemic Gastrointestinal:  '[]'$ Diarrhea   '[]'$ Vomiting  '[]'$ Gastroesophageal reflux/heartburn   '[]'$ Difficulty swallowing. Genitourinary:  '[]'$ Chronic kidney disease   '[]'$ Difficult urination  '[]'$ Frequent urination   '[]'$ Blood in urine Skin:  '[x]'$ Rashes   '[x]'$ Ulcers  Psychological:  '[]'$ History of anxiety   '[]'$  History of major depression.  Physical Examination  There were no vitals filed for this visit. There is no height or weight on file to calculate BMI. Gen: WD/WN, NAD Head: [place]/AT, No temporalis wasting.  Ear/Nose/Throat: Hearing grossly intact, nares w/o erythema or drainage Eyes: PER, EOMI, sclera nonicteric.  Neck: Supple, no large masses.   Pulmonary:  Good air movement, no audible wheezing bilaterally, no use of accessory muscles.  Cardiac: RRR, no JVD Vascular:   ulcer left ankle has increased in size and has a small amount of slough, noninfected, marked venous changes with associated raynaud's changes Vessel Right Left  Radial Palpable Palpable  Gastrointestinal: Non-distended. No guarding/no peritoneal signs.  Musculoskeletal: M/S 5/5 throughout.  No deformity or atrophy.  Neurologic: CN 2-12 intact. Symmetrical.  Speech is fluent. Motor exam as listed above. Psychiatric: Judgment intact, Mood & affect appropriate for pt's clinical situation. Dermatologic: venous rashes with ulcer noted.  No changes consistent with cellulitis. Lymph : No lichenification or skin changes of chronic lymphedema.  CBC Lab Results   Component Value Date   WBC 8.9 04/07/2021   HGB 11.1 (L) 04/07/2021   HCT 34.5 (L) 04/07/2021   MCV 92.2 04/07/2021   PLT 346 04/07/2021    BMET    Component Value Date/Time   NA 137 04/07/2021 1015   NA 141 02/06/2021 1109   NA 135 (L) 03/06/2012 0546   K 4.5 04/07/2021 1015   K 4.9 03/06/2012 0546   CL 101 04/07/2021 1015   CL 99 03/06/2012 0546   CO2 30 04/07/2021 1015   CO2 28 03/06/2012 0546   GLUCOSE 135 (H) 04/07/2021 1015   GLUCOSE 138 (H) 03/06/2012 0546   BUN 34 (H) 04/07/2021 1015   BUN 17 02/06/2021 1109  BUN 21 (H) 03/06/2012 0546   CREATININE 0.91 04/07/2021 1015   CREATININE 0.90 03/24/2021 0930   CALCIUM 8.7 (L) 04/07/2021 1015   CALCIUM 8.4 (L) 03/06/2012 0546   GFRNONAA >60 04/07/2021 1015   GFRNONAA 64 03/24/2021 0930   GFRAA 75 03/24/2021 0930   CrCl cannot be calculated (Patient's most recent lab result is older than the maximum 21 days allowed.).  COAG No results found for: INR, PROTIME  Radiology No results found.    Assessment/Plan 1. AVM (arteriovenous malformation) Continue wound care, the ulcer has a very similar appearance to a typical venous ulcer.  2. Chronic venous insufficiency No surgery or intervention at this point in time.    I have had a long discussion with the patient regarding venous insufficiency and why it  causes symptoms, specifically venous ulceration . I have discussed with the patient the chronic skin changes that accompany venous insufficiency and the long term sequela such as infection and recurring  ulceration.  Patient will be placed in [employer] which will be changed weekly drainage permitting.  In addition, behavioral modification including several periods of elevation of the lower extremities during the day will be continued. Achieving a position with the ankles at heart level was stressed to the patient  The patient is instructed to begin routine exercise, especially walking on a daily basis  3. Primary  hypertension Continue antihypertensive medications as already ordered, these medications have been reviewed and there are no changes at this time.   4. Coronary artery disease involving native coronary artery of native heart without angina pectoris Continue cardiac and antihypertensive medications as already ordered and reviewed, no changes at this time.  Continue statin as ordered and reviewed, no changes at this time  Nitrates PRN for chest pain   5. Type 2 diabetes mellitus without complication, without long-term current use of insulin (HCC) Continue hypoglycemic medications as already ordered, these medications have been reviewed and there are no changes at this time.  Hgb A1C to be monitored as already arranged by primary service    Hortencia Pilar, MD  05/21/2021 1:11 PM

## 2021-05-22 ENCOUNTER — Ambulatory Visit: Payer: Medicare Other | Admitting: Physician Assistant

## 2021-05-23 ENCOUNTER — Encounter (INDEPENDENT_AMBULATORY_CARE_PROVIDER_SITE_OTHER): Payer: Self-pay | Admitting: Vascular Surgery

## 2021-05-23 DIAGNOSIS — I872 Venous insufficiency (chronic) (peripheral): Secondary | ICD-10-CM | POA: Insufficient documentation

## 2021-05-25 ENCOUNTER — Encounter (INDEPENDENT_AMBULATORY_CARE_PROVIDER_SITE_OTHER): Payer: Self-pay

## 2021-05-27 ENCOUNTER — Encounter: Payer: Self-pay | Admitting: Family Medicine

## 2021-05-27 DIAGNOSIS — R768 Other specified abnormal immunological findings in serum: Secondary | ICD-10-CM

## 2021-05-27 DIAGNOSIS — M0609 Rheumatoid arthritis without rheumatoid factor, multiple sites: Secondary | ICD-10-CM

## 2021-05-27 DIAGNOSIS — M25542 Pain in joints of left hand: Secondary | ICD-10-CM

## 2021-05-27 DIAGNOSIS — M25541 Pain in joints of right hand: Secondary | ICD-10-CM

## 2021-05-27 DIAGNOSIS — M069 Rheumatoid arthritis, unspecified: Secondary | ICD-10-CM | POA: Insufficient documentation

## 2021-05-27 NOTE — Progress Notes (Signed)
MRN : [id number]  Tina Berger is a 70 y.o. (1951/03/29) female who presents with chief complaint of wound check.  History of Present Illness:  Patient returns the office status post removal of AV malformation left leg.  Last week the incision had separated further and it had a very characteristic appearance of a venous ulcer.  She also had significant increased swelling at that visit as well.  Therefore Unna boot therapy was started.   She denies pain or drainage at the surgery site but she notes the wound seems a little smaller.  She tolerated the Unna boot and removed it this morning.   Path is consistent with a benign AV malformation  No outpatient medications have been marked as taking for the 05/28/21 encounter (Appointment) with Delana Meyer, Dolores Lory, MD.    Past Medical History:  Diagnosis Date   Acute renal insufficiency 05/31/2014   pt not aware of this diagnosis   Anemia, iron deficiency    Anxiety    Bilateral lower extremity edema    a. uses torsemide   Cervical dysplasia    abnormal paps   Colon cancer screening 06/14/2014   Coronary artery disease    Cough 08/08/2012   Degenerative disk disease 11/19/2011   Depression    Diabetes mellitus type II    Diverticulosis    DJD (degenerative joint disease)    Drug rash 05/22/2011   Dry eyes    Dysrhythmia    metoprolol.   Edema    Elevated liver enzymes 03/21/2012   Encounter for routine gynecological examination 06/14/2014   ESOPHAGITIS 03/28/2007   Qualifier: Hospitalized for  By: Marcelino Scot CMA, [place] Bilberry     Fibromyalgia    GASTRITIS 03/28/2007   Qualifier: History of  By: Marcelino Scot CMA, [place] Bilberry     GERD (gastroesophageal reflux disease)    Hemorrhoids    external   HLD (hyperlipidemia)    HNP (herniated nucleus pulposus) 11/1997   T6,7,8 with DJD   HTN (hypertension)    Hyperglycemia 05/13/2008   Qualifier: Diagnosis of  By: Glori Bickers MD, Carmell Austria    Hypothyroidism    Interstitial lung disease  ([place])    pt not aware of this   Left ovarian cyst    x 3, rupture   Osteoarthritis    hands   Osteopenia    mild-11/01; improved 12/05   Other screening mammogram 08/18/2011   Palpitations    PERSONAL HISTORY ALLERGY UNSPEC MEDICINAL AGENT 03/18/2010   Qualifier: Diagnosis of  By: Glori Bickers MD, Carmell Austria    PONV (postoperative nausea and vomiting)    Raynaud's disease    Recurrent HSV (herpes simplex virus)    lesions in nose or mouth with frequent ulcers. d/t meds and dry mouth   Rheumatoid arthritis ([place])    Rhinitis 03/21/2012   Routine general medical examination at a health care facility 03/28/2011    Past Surgical History:  Procedure Laterality Date   ABDOMINAL EXPLORATION SURGERY     ACDF  1991   BIOPSY OF SKIN SUBCUTANEOUS TISSUE AND/OR MUCOUS MEMBRANE Left 04/08/2021   Procedure: BIOPSY OF SKIN SUBCUTANEOUS TISSUE AND/OR MUCOUS MEMBRANE ( REMOVAL SKIN NODULE);  Surgeon: Katha Cabal, MD;  Location: [facility] ORS;  Service: Vascular;  Laterality: Left;   COLONOSCOPY  08/2000   Diverticulosis; hemorrhoids   HAND SURGERY Left    left thumb. PIN REMOVED   LASIK     bilateral    Social History Social History  Tobacco Use   Smoking status: Former    Types: Cigarettes    Quit date: 1970    Years since quitting: 52.6   Smokeless tobacco: Never   Tobacco comments:    quit over 40 years  Vaping Use   Vaping Use: Never used  Substance Use Topics   Alcohol use: No    Alcohol/week: 0.0 standard drinks   Drug use: No    Family History Family History  Problem Relation Age of Onset   Emphysema Father        + smoker   Lung cancer Mother        + smoker   Coronary artery disease Mother        relatively young   Lymphoma Sister    Heart disease Brother    Lymphoma Brother    Parkinson's disease Brother    Anemia Brother        aplastic    Diabetes Brother    Leukemia Son    Iron deficiency Daughter    Breast cancer Neg Hx     Allergies  Allergen  Reactions   Ceftin [Cefuroxime] Swelling    Swelling, "legs turn blue".  Legs swelling, then blue, then a rash   Clonidine Derivatives     Swelling of lips   Erythromycin Swelling    Rash, swollen gums    Keflex [Cephalexin] Anaphylaxis    Chest was broken out in a rash. Lips were swelling. Took a few days to occur, but it kept getting worse.                                                                                                                                                                                                   Penicillins Anaphylaxis    Pt had a daughter with an allergy to this at a young age. Patient developed blisters anywhere her child touched her. Also, if she used the bathroom after daughter did while on keflex, it would cause a reaction for her. Lips and roof of mouth swelled up also.   Sulfonamide Derivatives Swelling    Rash, swollen gums, lips   Ciprofloxacin     REACTION: ? rash vs sun rxn. Rash    Fluoxetine Hcl     REACTION: stomach problems. Severe pain in abdomen. Could not function d/t pain   Furosemide Swelling    REACTION: swelling worsened in legs once taking   Gabapentin Other (See Comments)    REACTION: edema of feet. Unable to get shoes on   Paroxetine     REACTION: weight gain (30  pound weight gain   Pregabalin     REACTION: swelling of feet and legs. Unable to get shoes on.   Tetracycline     REACTION:inflammed genitals.    Venlafaxine     REACTION: sweating profusely   Etodolac     REACTION: reaction not known   Amitriptyline Hcl     REACTION: sedating   Atorvastatin     REACTION: muscle twitch and pain   Benicar [Olmesartan Medoxomil]     Muscle pain    Cephalexin Hives and Rash   Cetirizine Hcl     REACTION: headache   Diovan [Valsartan]     Thought it made her feel confused   Naproxen Sodium Other (See Comments)    REACTION: edema of feet and legs.  Not good for ckd     REVIEW OF SYSTEMS (Negative unless  checked)  Constitutional: '[]'$ Weight loss  '[]'$ Fever  '[]'$ Chills Cardiac: '[]'$ Chest pain   '[]'$ Chest pressure   '[]'$ Palpitations   '[]'$ Shortness of breath when laying flat   '[]'$ Shortness of breath with exertion. Vascular:  '[]'$ Pain in legs with walking   '[]'$ Pain in legs at rest  '[]'$ History of DVT   '[]'$ Phlebitis   '[x]'$ Swelling in legs   '[]'$ Varicose veins   '[x]'$ Non-healing ulcers Pulmonary:   '[]'$ Uses home oxygen   '[]'$ Productive cough   '[]'$ Hemoptysis   '[]'$ Wheeze  '[]'$ COPD   '[]'$ Asthma Neurologic:  '[]'$ Dizziness   '[]'$ Seizures   '[]'$ History of stroke   '[]'$ History of TIA  '[]'$ Aphasia   '[]'$ Vissual changes   '[]'$ Weakness or numbness in arm   '[]'$ Weakness or numbness in leg Musculoskeletal:   '[]'$ Joint swelling   '[]'$ Joint pain   '[]'$ Low back pain Hematologic:  '[]'$ Easy bruising  '[]'$ Easy bleeding   '[]'$ Hypercoagulable state   '[]'$ Anemic Gastrointestinal:  '[]'$ Diarrhea   '[]'$ Vomiting  '[]'$ Gastroesophageal reflux/heartburn   '[]'$ Difficulty swallowing. Genitourinary:  '[]'$ Chronic kidney disease   '[]'$ Difficult urination  '[]'$ Frequent urination   '[]'$ Blood in urine Skin:  '[]'$ Rashes   '[x]'$ Ulcers  Psychological:  '[]'$ History of anxiety   '[]'$  History of major depression.  Physical Examination  There were no vitals filed for this visit. There is no height or weight on file to calculate BMI. Gen: WD/WN, NAD Head: [place]/AT, No temporalis wasting.  Ear/Nose/Throat: Hearing grossly intact, nares w/o erythema or drainage, pinna without lesions Eyes: PER, EOMI, sclera nonicteric.  Neck: Supple, no gross masses.  No JVD.  Pulmonary:  Good air movement, no audible wheezing, no use of accessory muscles.  Cardiac: RRR, precordium not hyperdynamic. Vascular:  scattered varicosities present bilaterally.  Moderate venous stasis changes to the feet and ankle bilaterally.  2+ soft pitting edema; ulcer appears smaller but there is still some slough in the base Vessel Right Left  Radial Palpable Palpable  Gastrointestinal: soft, non-distended. No guarding/no peritoneal signs.  Musculoskeletal: M/S  5/5 throughout.  No deformity.  Neurologic: CN 2-12 intact. Pain and light touch intact in extremities.  Symmetrical.  Speech is fluent. Motor exam as listed above. Psychiatric: Judgment intact, Mood & affect appropriate for pt's clinical situation. Dermatologic: Venous rashes no ulcers noted.  No changes consistent with cellulitis. Lymph : No lichenification or skin changes of chronic lymphedema.  CBC Lab Results  Component Value Date   WBC 8.9 04/07/2021   HGB 11.1 (L) 04/07/2021   HCT 34.5 (L) 04/07/2021   MCV 92.2 04/07/2021   PLT 346 04/07/2021    BMET    Component Value Date/Time   NA 137 04/07/2021 1015   NA 141 02/06/2021 1109  NA 135 (L) 03/06/2012 0546   K 4.5 04/07/2021 1015   K 4.9 03/06/2012 0546   CL 101 04/07/2021 1015   CL 99 03/06/2012 0546   CO2 30 04/07/2021 1015   CO2 28 03/06/2012 0546   GLUCOSE 135 (H) 04/07/2021 1015   GLUCOSE 138 (H) 03/06/2012 0546   BUN 34 (H) 04/07/2021 1015   BUN 17 02/06/2021 1109   BUN 21 (H) 03/06/2012 0546   CREATININE 0.91 04/07/2021 1015   CREATININE 0.90 03/24/2021 0930   CALCIUM 8.7 (L) 04/07/2021 1015   CALCIUM 8.4 (L) 03/06/2012 0546   GFRNONAA >60 04/07/2021 1015   GFRNONAA 64 03/24/2021 0930   GFRAA 75 03/24/2021 0930   CrCl cannot be calculated (Patient's most recent lab result is older than the maximum 21 days allowed.).  COAG No results found for: INR, PROTIME  Radiology No results found.   Assessment/Plan 1. AVM (arteriovenous malformation) No surgery or intervention at this point in time.    I have had a long discussion with the patient regarding venous insufficiency and why it  causes symptoms, specifically venous ulceration . I have discussed with the patient the chronic skin changes that accompany venous insufficiency and the long term sequela such as infection and recurring  ulceration.  Patient will be placed in an [employer] which will be changed weekly drainage permitting.  In addition,  behavioral modification including several periods of elevation of the lower extremities during the day will be continued. Achieving a position with the ankles at heart level was stressed to the patient  The patient is instructed to begin routine exercise, especially walking on a daily basis  Patient should undergo duplex ultrasound of the venous system to ensure that DVT or reflux is not present.  Following the review of the ultrasound the patient will follow up in one week to reassess the degree of swelling and the control that Unna therapy is offering.   The patient can be assessed for graduated compression stockings or wraps as well as a Lymph Pump once the ulcers are healed.   2. Coronary artery disease involving native coronary artery of native heart without angina pectoris Continue cardiac and antihypertensive medications as already ordered and reviewed, no changes at this time.  Continue statin as ordered and reviewed, no changes at this time  Nitrates PRN for chest pain   3. Raynaud's disease without gangrene The patient's Raynaud's is better and her symptoms are stable.  Behavioral modification was stressed; avoidance of cold and utilizing wool socks was reviewed again.  4. Type 2 diabetes mellitus without complication, without long-term current use of insulin (HCC) Continue hypoglycemic medications as already ordered, these medications have been reviewed and there are no changes at this time.  Hgb A1C to be monitored as already arranged by primary service   5. Hyperlipidemia associated with type 2 diabetes mellitus ([place]) Continue statin as ordered and reviewed, no changes at this time    Hortencia Pilar, MD  05/27/2021 1:41 PM

## 2021-05-28 ENCOUNTER — Encounter (INDEPENDENT_AMBULATORY_CARE_PROVIDER_SITE_OTHER): Payer: Self-pay | Admitting: Vascular Surgery

## 2021-05-28 ENCOUNTER — Ambulatory Visit (INDEPENDENT_AMBULATORY_CARE_PROVIDER_SITE_OTHER): Payer: Medicare Other | Admitting: Vascular Surgery

## 2021-05-28 ENCOUNTER — Other Ambulatory Visit: Payer: Self-pay

## 2021-05-28 VITALS — BP 134/78 | HR 92 | Resp 16 | Wt 199.2 lb

## 2021-05-28 DIAGNOSIS — E785 Hyperlipidemia, unspecified: Secondary | ICD-10-CM

## 2021-05-28 DIAGNOSIS — Q2730 Arteriovenous malformation, site unspecified: Secondary | ICD-10-CM | POA: Diagnosis not present

## 2021-05-28 DIAGNOSIS — I7300 Raynaud's syndrome without gangrene: Secondary | ICD-10-CM

## 2021-05-28 DIAGNOSIS — E119 Type 2 diabetes mellitus without complications: Secondary | ICD-10-CM

## 2021-05-28 DIAGNOSIS — I2510 Atherosclerotic heart disease of native coronary artery without angina pectoris: Secondary | ICD-10-CM

## 2021-05-28 DIAGNOSIS — E1169 Type 2 diabetes mellitus with other specified complication: Secondary | ICD-10-CM

## 2021-06-02 ENCOUNTER — Ambulatory Visit (INDEPENDENT_AMBULATORY_CARE_PROVIDER_SITE_OTHER): Payer: Medicare Other | Admitting: Internal Medicine

## 2021-06-02 ENCOUNTER — Other Ambulatory Visit: Payer: Self-pay

## 2021-06-02 ENCOUNTER — Ambulatory Visit: Payer: Medicare Other | Admitting: Internal Medicine

## 2021-06-02 ENCOUNTER — Encounter: Payer: Self-pay | Admitting: Internal Medicine

## 2021-06-02 VITALS — BP 126/68 | HR 71 | Temp 99.4°F | Ht 62.0 in | Wt 202.0 lb

## 2021-06-02 DIAGNOSIS — I7300 Raynaud's syndrome without gangrene: Secondary | ICD-10-CM | POA: Diagnosis not present

## 2021-06-02 DIAGNOSIS — J8489 Other specified interstitial pulmonary diseases: Secondary | ICD-10-CM | POA: Diagnosis not present

## 2021-06-02 DIAGNOSIS — R0600 Dyspnea, unspecified: Secondary | ICD-10-CM

## 2021-06-02 DIAGNOSIS — Z889 Allergy status to unspecified drugs, medicaments and biological substances status: Secondary | ICD-10-CM

## 2021-06-02 DIAGNOSIS — M359 Systemic involvement of connective tissue, unspecified: Secondary | ICD-10-CM | POA: Diagnosis not present

## 2021-06-02 DIAGNOSIS — R0609 Other forms of dyspnea: Secondary | ICD-10-CM

## 2021-06-02 DIAGNOSIS — M0609 Rheumatoid arthritis without rheumatoid factor, multiple sites: Secondary | ICD-10-CM

## 2021-06-02 LAB — PULMONARY FUNCTION TEST
DL/VA % pred: 104 %
DL/VA: 4.37 ml/min/mmHg/L
DLCO cor % pred: 81 %
DLCO cor: 14.84 ml/min/mmHg
DLCO unc % pred: 81 %
DLCO unc: 14.84 ml/min/mmHg
FEF 25-75 Post: 2.22 L/sec
FEF 25-75 Pre: 1.7 L/sec
FEF2575-%Change-Post: 30 %
FEF2575-%Pred-Post: 127 %
FEF2575-%Pred-Pre: 98 %
FEV1-%Change-Post: 4 %
FEV1-%Pred-Post: 89 %
FEV1-%Pred-Pre: 85 %
FEV1-Post: 1.83 L
FEV1-Pre: 1.75 L
FEV1FVC-%Change-Post: 5 %
FEV1FVC-%Pred-Pre: 107 %
FEV6-%Change-Post: -1 %
FEV6-%Pred-Post: 82 %
FEV6-%Pred-Pre: 83 %
FEV6-Post: 2.13 L
FEV6-Pre: 2.16 L
FEV6FVC-%Change-Post: 0 %
FEV6FVC-%Pred-Post: 104 %
FEV6FVC-%Pred-Pre: 104 %
FVC-%Change-Post: -1 %
FVC-%Pred-Post: 78 %
FVC-%Pred-Pre: 79 %
FVC-Post: 2.14 L
FVC-Pre: 2.16 L
Post FEV1/FVC ratio: 86 %
Post FEV6/FVC ratio: 100 %
Pre FEV1/FVC ratio: 81 %
Pre FEV6/FVC Ratio: 100 %
RV % pred: 89 %
RV: 1.88 L
TLC % pred: 90 %
TLC: 4.29 L

## 2021-06-02 NOTE — Patient Instructions (Signed)
Interstitial lung disease due to connective tissue disease (HCC) Drug allergy, multiple  -You have interstitial lung disease and this is due to rheumatoid arthritis or at least a rheumatoid arthritis is playing a role in triggering prior interstitial lung disease -Since 2013 there is very mild progression through 2022 -Current disease burden is very mild [based on symptoms, walk test, pulmonary function test and CT scan]  Plan - Based on current evidence antifibrotic therapy is indicated only for progressive phenotype which is defined as significant progression within the last 1 or 2 years -Given the fact overall he is stable with minimal symptoms, current approach is to closely monitor especially in the context that antifibrotic's have significant side effects that are mostly unpleasant GI symptoms -Do repeat pulmonary function test spirometry and DLCO in 6 months [can be done at [facility] in [place]]  Rheumatoid arthritis of multiple sites with negative rheumatoid factor ([place]) Raynaud's disease without gangrene  -Noted that you  did not tolerate Imuran  Plan - We will send a note to rheumatology to treat your rheumatoid arthritis with primary focus using  agents that can improve symptom control with the joints and improve mobility new joints   Follow-up - 6 months do spirometry and DLCO on return to see Dr. Chase Caller for follow-up in a 15-minute slot  -Symptom score and simple walking desaturation test at follow-up  -If you want ou can also see my assistant in [place] instead of seeing me in 6 months

## 2021-06-02 NOTE — Progress Notes (Signed)
OV 06/02/2021  Subjective:  Patient ID: Tina Berger, female , DOB: 1951-03-21 , age 70 y.o. , MRN: [id number] , ADDRESS: [place] [id number] PCP Tower, Tina Fanny, MD Patient Care Team: Tower, Tina Fanny, MD as PCP - General [name], [name], MD as Consulting Physician (Cardiology) [name], [name] as Pharmacist (Pharmacist)  This Provider for this visit: Treatment Team:  Attending Provider: Brand Males, MD    06/02/2021 -   Chief Complaint  Patient presents with   Consult    Pt is being referred by Dr. Estanislado Pandy for ILD work up. Pt had PFT performed today.  Pt states that she has had problems with SOB for years but it has been just getting worse.     HPI [name] 70 y.o. -referred by Dr. Estanislado Pandy for concern of rheumatoid arthritis ILD.  History is provided by the patient and also review of the records.  She tells me that in 2013 she used to work at [employer] and was exposed to chlorine at work and a few days later there was a birthday party and in the swimming pool there is a lot of chlorine.  And a few days after that started having significant shortness of breath with exertion relieved by rest.  Was hypoxemic was admitted for 5 days.  During this time she was also nitrofurantoin and was informed she had ILD because of chlorine exposure and nitrofurantoin.  After that she recovered and has been stable without any respiratory symptoms.  Then starting October 2021 started noticing increased fatigue and also in decreased mobility and hand and joint stiffness.  She did see Dr. Keturah Barre in March and April 2022.  Was given a diagnosis of rheumatoid arthritis negative rheumatoid factor positive CCP.  Mixed with methotrexate was discussed but later patient declined it.  Then in April 2022 started Imuran.  She tells me she took Imuran for 8 weeks but then had vomiting fatigue fogginess and also scared about getting leukemia because she lost her son from  leukemia.  Therefore she stopped it.  She feels prednisone will help her a lot but she says that she has been advised about the side effects of prednisone.  She has multiple drug allergies.  She says because of her ILD she was advised about 2 options for her rheumatoid arthritis.  I am presuming based on chart review this is methotrexate and Imuran.  She says the sores are too restrictive.  She specifically wants to know how I would treat her ILD.  [name] Comprehensive ILD Questionnaire  Symptoms:    SYMPTOM SCALE - ILD 06/02/2021   O2 use ra  Shortness of Breath 0 -> 5 scale with 5 being worst (score 6 If unable to do)  At rest 0  Simple tasks - showers, clothes change, eating, shaving 0  Household (dishes, doing bed, laundry) 0  Shopping 0  Walking level at own pace 0  Walking up Stairs 0  Total (30-36) Dyspnea Score 0  How bad is your cough? 0  How bad is your fatigue yes  How bad is nausea 0  How bad is vomiting?  0  How bad is diarrhea? 0  How bad is anxiety? ?  How bad is depression ?      Past Medical History : Positive for rheumatoid arthritis diagnosed in 2022.  She has history of irregular heart rate not otherwise specified.  She sees Dr. [name].  She  says she has had echocardiogram and stress test 2010 years ago.  She is on Lopressor.  She has coronary artery calcification on the CT but she says she sees a cardiologist regularly.  She also has GERD and hiatal hernia.  She has diabetes she has thyroid disease not otherwise specified.  She has obesity   has a past medical history of Acute renal insufficiency (05/31/2014), Anemia, iron deficiency, Anxiety, Bilateral lower extremity edema, Cervical dysplasia, Colon cancer screening (06/14/2014), Coronary artery disease, Cough (08/08/2012), Degenerative disk disease (11/19/2011), Depression, Diabetes mellitus type II, Diverticulosis, DJD (degenerative joint disease), Drug rash (05/22/2011), Dry eyes, Dysrhythmia,  Edema, Elevated liver enzymes (03/21/2012), Encounter for routine gynecological examination (06/14/2014), ESOPHAGITIS (03/28/2007), Fibromyalgia, GASTRITIS (03/28/2007), GERD (gastroesophageal reflux disease), Hemorrhoids, HLD (hyperlipidemia), HNP (herniated nucleus pulposus) (11/1997), HTN (hypertension), Hyperglycemia (05/13/2008), Hypothyroidism, Interstitial lung disease ([place]), Left ovarian cyst, Osteoarthritis, Osteopenia, Other screening mammogram (08/18/2011), Palpitations, 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   has a past surgical history that includes Hand surgery (Left); Colonoscopy (08/2000); LASIK; Abdominal exploration surgery; ACDF (1991); and Biopsy of skin subcutaneous tissue and/or mucous membrane (Left, 04/08/2021).    ROS: She has significant fatigue she has arthralgia.  She has Raynaud's she has dry eyes dry mouth.  She has acid reflux.     FAMILY HISTORY of LUNG DISEASE: Positive for asthma in the family not otherwise specified but negative for COPD or pulmonary fibrosis or collagen vascular disease or vasculitis   EXPOSURE HISTORY: Smoked between 72 and 1970 to 10 cigarettes/day.  Does not smoke marijuana but no pipe no cocaine no marijuana use.  No intravenous drug use    HOME and HOBBY DETAILS : [place]  She has lived in the home for 31 years the age of the home is 54.  Built in 1969.  No dampness.  No mold or mildew in the house no humidifier use no CPAP use no nebulizer use no steam iron use no Jacuzzi use.  No misting [place] no parakeets inside the house no pet gerbils or hamsters.  No feather pillows or do way.  No mold in the [facility] duct.  No music habits no gardening habits no bird  feathers.  There was mold and mildew in the bathroom at work but that was in the remote past has not worked in many years.  The building was 71 years old   OCCUPATIONAL HISTORY (122 questions) : No organic or inorganic antigen exposure history.  Greater than 122 item   PULMONARY TOXICITY HISTORY (27 items): She did take Macrobid/nitrofurantoin in 2013.  She took Imuran for 8 weeks in 2022    Simple office walk 185 feet x  3 laps goal with forehead probe 06/02/2021   O2 used ra  Number laps completed 3  Comments about pace avg  Resting Pulse Ox/HR 97% and 71/min  Final Pulse Ox/HR 93% and 105/min  Desaturated </= 88% no  Desaturated <= 3% points Yes 4 points  Got Tachycardic >/= 90/min yes  Symptoms at end of test Moderate dyspnea  Miscellaneous comments x      Narrative & Impression  CLINICAL DATA:  70 year old female with history of shortness of breath. History of rheumatoid arthritis. Shortness of breath with activity.   EXAM: CT CHEST WITHOUT CONTRAST   TECHNIQUE: Multidetector CT imaging  of the chest was performed following the standard protocol without intravenous contrast. High resolution imaging of the lungs, as well as inspiratory and expiratory imaging, was performed.   COMPARISON:  Chest CT 04/03/2012.   FINDINGS: Cardiovascular: Heart size is normal. There is no significant pericardial fluid, thickening or pericardial calcification. There is aortic atherosclerosis, as well as atherosclerosis of the great vessels of the mediastinum and the coronary arteries, including calcified atherosclerotic plaque in the left main, left anterior descending and right coronary arteries.   Mediastinum/Nodes: No pathologically enlarged mediastinal or hilar lymph nodes. Please note that accurate exclusion of hilar adenopathy is limited on noncontrast CT scans. Esophagus is unremarkable in appearance. No axillary lymphadenopathy.   Lungs/Pleura: High-resolution images  demonstrate patchy areas of ground-glass attenuation, septal thickening, mild subpleural reticulation and peripheral bronchiolectasis scattered throughout the lungs bilaterally. Findings have a definitive craniocaudal gradient and appear mildly progressive compared to the prior study, with slightly less ground-glass attenuation but more septal thickening and subpleural reticulation. No frank honeycombing. Inspiratory and expiratory imaging demonstrates some mild air trapping indicative of small airways disease. In addition, there is collapse of the trachea and mainstem bronchi during expiration. No acute consolidative airspace disease. No pleural effusions. No suspicious appearing pulmonary nodules or masses are noted.   Upper Abdomen: Unremarkable.   Musculoskeletal: There are no aggressive appearing lytic or blastic lesions noted in the visualized portions of the skeleton.   IMPRESSION: 1. The appearance of the lungs is compatible with interstitial lung disease which is mildly progressive when compared to remote prior study from 9 years ago. The spectrum of findings on today's examination is categorized as probable usual interstitial pneumonia (UIP) per current ATS guidelines. 2. Aortic atherosclerosis, in addition to left main and 2 vessel coronary artery disease. Assessment for potential risk factor modification, dietary therapy or pharmacologic therapy may be warranted, if clinically indicated.   Aortic Atherosclerosis (ICD10-I70.0).     Electronically Signed   By: Vinnie Langton M.D.   On: 01/19/2021 18:20      PFT  PFT Results Latest Ref Rng & Units 06/02/2021  FVC-Pre L 2.16  FVC-Predicted Pre % 79  FVC-Post L 2.14  FVC-Predicted Post % 78  Pre FEV1/FVC % % 81  Post FEV1/FCV % % 86  FEV1-Pre L 1.75  FEV1-Predicted Pre % 85  FEV1-Post L 1.83  DLCO uncorrected ml/min/mmHg 14.84  DLCO UNC% % 81  DLCO corrected ml/min/mmHg 14.84  DLCO COR %Predicted % 81   DLVA Predicted % 104  TLC L 4.29  TLC % Predicted % 90  RV % Predicted % 89       has a past medical history of Acute renal insufficiency (05/31/2014), Anemia, iron deficiency, Anxiety, Bilateral lower extremity edema, Cervical dysplasia, Colon cancer screening (06/14/2014), Coronary artery disease, Cough (08/08/2012), Degenerative disk disease (11/19/2011), Depression, Diabetes mellitus type II, Diverticulosis, DJD (degenerative joint disease), Drug rash (05/22/2011), Dry eyes, Dysrhythmia, Edema, Elevated liver enzymes (03/21/2012), Encounter for routine gynecological examination (06/14/2014), ESOPHAGITIS (03/28/2007), Fibromyalgia, GASTRITIS (03/28/2007), GERD (gastroesophageal reflux disease), Hemorrhoids, HLD (hyperlipidemia), HNP (herniated nucleus pulposus) (11/1997), HTN (hypertension), Hyperglycemia (05/13/2008), Hypothyroidism, Interstitial lung disease ([place]), Left ovarian cyst, Osteoarthritis, Osteopenia, Other screening mammogram (08/18/2011), Palpitations, PERSONAL HISTORY ALLERGY UNSPEC MEDICINAL AGENT (03/18/2010), PONV (postoperative nausea and vomiting), Raynaud's disease, Recurrent HSV (herpes simplex virus), Rheumatoid arthritis ([place]), Rhinitis (03/21/2012), and Routine general medical examination at a health care facility (03/28/2011).   reports that she quit smoking about 52 years ago. Her smoking use included cigarettes. She  has a 5.00 pack-year smoking history. She has never used smokeless tobacco.  Past Surgical History:  Procedure Laterality Date   ABDOMINAL EXPLORATION SURGERY     ACDF  1991   BIOPSY OF SKIN SUBCUTANEOUS TISSUE AND/OR MUCOUS MEMBRANE Left 04/08/2021   Procedure: BIOPSY OF SKIN SUBCUTANEOUS TISSUE AND/OR MUCOUS MEMBRANE ( REMOVAL SKIN NODULE);  Surgeon: Katha Cabal, MD;  Location: [facility] ORS;  Service: Vascular;  Laterality: Left;   COLONOSCOPY  08/2000   Diverticulosis; hemorrhoids   HAND SURGERY Left    left thumb. PIN REMOVED   LASIK      bilateral    Allergies  Allergen Reactions   Ceftin [Cefuroxime] Swelling    Swelling, "legs turn blue".  Legs swelling, then blue, then a rash   Clonidine Derivatives     Swelling of lips   Erythromycin Swelling    Rash, swollen gums    Keflex [Cephalexin] Anaphylaxis    Chest was broken out in a rash. Lips were swelling. Took a few days to occur, but it kept getting worse.                                                                                                                                                                                                   Penicillins Anaphylaxis    Pt had a daughter with an allergy to this at a young age. Patient developed blisters anywhere her child touched her. Also, if she used the bathroom after daughter did while on keflex, it would cause a reaction for her. Lips and roof of mouth swelled up also.   Sulfonamide Derivatives Swelling    Rash, swollen gums, lips   Ciprofloxacin     REACTION: ? rash vs sun rxn. Rash    Fluoxetine Hcl     REACTION: stomach problems. Severe pain in abdomen. Could not function d/t pain   Furosemide Swelling    REACTION: swelling worsened in legs once taking   Gabapentin Other (See Comments)    REACTION: edema of feet. Unable to get shoes on   Paroxetine     REACTION: weight gain (30 pound weight gain   Pregabalin     REACTION: swelling of feet and legs. Unable to get shoes on.   Tetracycline     REACTION:inflammed genitals.    Venlafaxine     REACTION: sweating profusely   Etodolac     REACTION: reaction not known   Amitriptyline Hcl     REACTION: sedating   Atorvastatin     REACTION: muscle twitch and pain   Benicar [Olmesartan Medoxomil]  Muscle pain    Cephalexin Hives and Rash   Cetirizine Hcl     REACTION: headache   Diovan [Valsartan]     Thought it made her feel confused   Naproxen Sodium Other (See Comments)    REACTION: edema of feet and legs.  Not good for ckd    Immunization  History  Administered Date(s) Administered   Fluad Quad(high Dose 65+) 08/07/2020   Influenza Split 06/19/2011   Influenza Whole 08/01/2009   Influenza, High Dose Seasonal PF 08/14/2018, 07/03/2019   Influenza,inj,Quad PF,6+ Mos 06/14/2014, 08/25/2015, 07/26/2016, 08/19/2017   Moderna Sars-Covid-2 Vaccination 09/17/2020   PFIZER(Purple Top)SARS-COV-2 Vaccination 11/27/2019, 12/18/2019   Pneumococcal Conjugate-13 08/25/2015   Pneumococcal Polysaccharide-23 08/25/2016   Td 11/25/2003   Tdap 06/14/2014   Zoster, Live 09/16/2014    Family History  Problem Relation Age of Onset   Emphysema Father        + smoker   Lung cancer Mother        + smoker   Coronary artery disease Mother        relatively young   Lymphoma Sister    Heart disease Brother    Lymphoma Brother    Parkinson's disease Brother    Anemia Brother        aplastic    Diabetes Brother    Leukemia Son    Iron deficiency Daughter    Breast cancer Neg Hx      Current Outpatient Medications:    Acetaminophen (TYLENOL ARTHRITIS PAIN PO), Take 650 mg by mouth daily., Disp: , Rfl:    buPROPion (WELLBUTRIN XL) 150 MG 24 hr tablet, Take 1 tablet (150 mg total) by mouth daily., Disp: 90 tablet, Rfl: 3   Chlorphen-Phenyleph-ASA (ALKA-SELTZER PLUS COLD PO), Take by mouth as needed., Disp: , Rfl:    cholecalciferol (VITAMIN D3) 25 MCG (1000 UNIT) tablet, Take 1,000 Units by mouth daily., Disp: , Rfl:    cyclobenzaprine (FLEXERIL) 10 MG tablet, TAKE 1/2 TO 1 TABLET BY  MOUTH ONCE DAILY AND 1  TABLET AT BEDTIME, Disp: 180 tablet, Rfl: 1   Diclofenac Sodium 1.5 % SOLN, Place 2 mLs onto the skin 4 (four) times daily., Disp: 3 Bottle, Rfl: 3   DULoxetine (CYMBALTA) 60 MG capsule, Take 1 capsule (60 mg total) by mouth daily., Disp: 90 capsule, Rfl: 3   esomeprazole (NEXIUM) 20 MG capsule, Take 1 capsule (20 mg total) by mouth daily., Disp: 90 capsule, Rfl: 3   famotidine (PEPCID) 40 MG tablet, Take 1 tablet (40 mg total) by mouth  daily., Disp: 90 tablet, Rfl: 3   ferrous sulfate 325 (65 FE) MG EC tablet, Take 325 mg by mouth daily with breakfast., Disp: , Rfl:    glucose blood test strip, One Touch Ultra stripts blue-To check sugar once daily and as needed for DM2 250.00, Disp: 100 each, Rfl: 3   GUAIFENESIN CR PO, Take by mouth as needed., Disp: , Rfl:    HYDROmorphone HCl (EXALGO) 12 MG T24A SR tablet, Take 12 mg by mouth 2 (two) times daily., Disp: , Rfl:    hyoscyamine (LEVSIN SL) 0.125 MG SL tablet, Take 1 tablet (0.125 mg total) by mouth every 4 (four) hours as needed for cramping. (Patient taking differently: Take 0.125 mg by mouth every 4 (four) hours as needed for cramping. Patient has tongue quivers at bedtime. Takes this for these spasms.), Disp: 270 tablet, Rfl: 3   ketoconazole (NIZORAL) 2 % shampoo, SHAMPOO WITH A SMALL AMOUNT AS  DIRECTED THREE TIMES A WEEK SHAMPOO SCALP 3 DAYS A WEEK, LET SHAMPOO SIT FOR 10 MINUTES AND RINSE OFF, Disp: 120 mL, Rfl: 2   levothyroxine (SYNTHROID) 150 MCG tablet, Take 1 tablet (150 mcg total) by mouth daily., Disp: 90 tablet, Rfl: 3   LYSINE PO, Take by mouth daily., Disp: , Rfl:    Magnesium 400 MG CAPS, Take by mouth daily., Disp: , Rfl:    metFORMIN (GLUCOPHAGE) 500 MG tablet, TAKE 1 TABLET BY MOUTH  TWICE DAILY WITH A MEAL, Disp: 180 tablet, Rfl: 3   metoprolol succinate (TOPROL-XL) 50 MG 24 hr tablet, TAKE 1 TABLET BY MOUTH  TWICE DAILY TAKE WITH OR  IMMEDIATELY FOLLOWING A  MEAL, Disp: 180 tablet, Rfl: 3   ondansetron (ZOFRAN) 4 MG tablet, Take 1 tablet (4 mg total) by mouth every 8 (eight) hours as needed for nausea or vomiting., Disp: 20 tablet, Rfl: 0   oxyCODONE (OXYCONTIN) 10 MG 12 hr tablet, Take 10 mg by mouth 3 (three) times daily as needed. Beakthrough pain, Disp: , Rfl:    ramipril (ALTACE) 5 MG capsule, Take 5 mg by mouth daily., Disp: , Rfl:    rosuvastatin (CRESTOR) 5 MG tablet, TAKE 1 TABLET BY MOUTH  TWICE WEEKLY, Disp: 26 tablet, Rfl: 3   torsemide (DEMADEX)  20 MG tablet, TAKE 1 TABLET BY MOUTH  DAILY AS NEEDED, Disp: 90 tablet, Rfl: 1   azaTHIOprine (IMURAN) 50 MG tablet, Take 2 tablets by mouth daily. (Patient not taking: No sig reported), Disp: 120 tablet, Rfl: 0      Objective:   Vitals:   06/02/21 1353  BP: 126/68  Pulse: 71  Temp: 99.4 F (37.4 C)  TempSrc: Oral  SpO2: 97%  Weight: 202 lb (91.6 kg)  Height: '5\' 2"'$  (1.575 m)    Estimated body mass index is 36.95 kg/m as calculated from the following:   Height as of this encounter: '5\' 2"'$  (1.575 m).   Weight as of this encounter: 202 lb (91.6 kg).  '@WEIGHTCHANGE'$ @  [employer]   06/02/21 1353  Weight: 202 lb (91.6 kg)     Physical Exam  General Appearance:    Alert, cooperative, no distress, appears stated age - yes , Deconditioned looking - no , OBESE  -yes, Sitting on Wheelchair -  no  Head:    Normocephalic, without obvious abnormality, atraumatic  Eyes:    PERRL, conjunctiva/corneas clear,  Ears:    Normal TM's and external ear canals, both ears  Nose:   Nares normal, septum midline, mucosa normal, no drainage    or sinus tenderness. OXYGEN ON  - no . Patient is @ ra   Throat:   Lips, mucosa, and tongue normal; teeth and gums normal. Cyanosis on lips - no  Neck:   Supple, symmetrical, trachea midline, no adenopathy;    thyroid:  no enlargement/tenderness/nodules; no carotid   bruit or JVD  Back:     Symmetric, no curvature, ROM normal, no CVA tenderness  Lungs:     Distress - no , Wheeze no, Barrell Chest - no, Purse lip breathing - no, Crackles - maybe at base   Chest Wall:    No tenderness or deformity.    Heart:    Regular rate and rhythm, S1 and S2 normal, no rub   or gallop, Murmur - no  Breast Exam:    NOT DONE  Abdomen:     Soft, non-tender, bowel sounds active all four quadrants,    no  masses, no organomegaly. Visceral obesity - yes  Genitalia:   NOT DONE  Rectal:   NOT DONE  Extremities:   Extremities - normal, Has Cane - no, Clubbing - no, Edema - no   Pulses:   2+ and symmetric all extremities  Skin:   Stigmata of Connective Tissue Disease - YES RA  Lymph nodes:   Cervical, supraclavicular, and axillary nodes normal  Psychiatric:  Neurologic:   Pleasant - yes, Anxious - no, Flat affect - no  CAm-ICU - neg, Alert and Oriented x 3 - yes, Moves all 4s - yes, Speech - normal, Cognition - intact        Assessment:       ICD-10-CM   1. Interstitial lung disease due to connective tissue disease ([place])  J84.89    M35.9     2. Drug allergy, multiple  Z88.9     3. Rheumatoid arthritis of multiple sites with negative rheumatoid factor (H[facility])  M06.09     4. Raynaud's disease without gangrene  I73.00          Plan:     Patient Instructions  Interstitial lung disease due to connective tissue disease ([place]) Drug allergy, multiple  -You have interstitial lung disease and this is due to rheumatoid arthritis or at least a rheumatoid arthritis is playing a role in triggering prior interstitial lung disease -Since 2013 there is very mild progression through 2022 -Current disease burden is very mild [based on symptoms, walk test, pulmonary function test and CT scan]  Plan - Based on current evidence antifibrotic therapy is indicated only for progressive phenotype which is defined as significant progression within the last 1 or 2 years -Given the fact overall he is stable with minimal symptoms, current approach is to closely monitor especially in the context that antifibrotic's have significant side effects that are mostly unpleasant GI symptoms -Do repeat pulmonary function test spirometry and DLCO in 6 months [can be done at [facility] in [place]]  Rheumatoid arthritis of multiple sites with negative rheumatoid factor ([place]) Raynaud's disease without gangrene  -Noted that you  did not tolerate Imuran  Plan - We will send a note to rheumatology to treat your rheumatoid arthritis with primary focus using  agents that can improve symptom control  with the joints and improve mobility new joints   Follow-up - 6 months do spirometry and DLCO on return to see Dr. Chase Caller for follow-up in a 15-minute slot  -Symptom score and simple walking desaturation test at follow-up  -If you want ou can also see my assistant in [place] instead of seeing me in 6 months    SIGNATURE    Dr. Brand Males, M.D., F.C.C.P,  Pulmonary and Critical Care Medicine Staff Physician, [place] Director - Interstitial Lung Disease  Program  Pulmonary [place] [facility] [place], [place], [id number]  Pager: [phone], If no answer or between  15:00h - 7:00h: call [phone] Telephone: [phone]  3:27 PM 06/02/2021

## 2021-06-02 NOTE — Progress Notes (Signed)
PFT done today. 

## 2021-06-03 NOTE — Progress Notes (Deleted)
Office Visit Note  Patient: Tina Berger             Date of Birth: 04/27/1951           MRN: [id number]             PCP: Abner Greenspan, MD Referring: Tower, [name], MD Visit Date: 06/17/2021 Occupation: '@GUAROCC'$ @  Subjective:  No chief complaint on file.   History of Present Illness: Tina Berger is a 70 y.o. female ***   Activities of Daily Living:  Patient reports morning stiffness for *** {minute/hour:19697}.   Patient {ACTIONS;DENIES/REPORTS:21021675::"Denies"} nocturnal pain.  Difficulty dressing/grooming: {ACTIONS;DENIES/REPORTS:21021675::"Denies"} Difficulty climbing stairs: {ACTIONS;DENIES/REPORTS:21021675::"Denies"} Difficulty getting out of chair: {ACTIONS;DENIES/REPORTS:21021675::"Denies"} Difficulty using hands for taps, buttons, cutlery, and/or writing: {ACTIONS;DENIES/REPORTS:21021675::"Denies"}  No Rheumatology ROS completed.   PMFS History:  Patient Active Problem List   Diagnosis Date Noted   Rheumatoid arthritis ([place]) 05/27/2021   Chronic venous insufficiency 05/23/2021   AVM (arteriovenous malformation) 04/04/2021   CAD (coronary artery disease) 01/22/2021   Primary osteoarthritis of both knees 01/12/2021   Positive ANA (antinuclear antibody) 11/17/2020   Joint pain 11/13/2020   Elevated serum creatinine 08/26/2020   Current use of proton pump inhibitor 08/26/2020   Estrogen deficiency 08/21/2018   TMJ (dislocation of temporomandibular joint), initial encounter 05/05/2018   Anterior neck pain 05/05/2018   Chronic pain syndrome 11/09/2017   Chronic upper extremity pain [facility]) (Bilateral) (L>R) 11/09/2017   Fibromyalgia syndrome 11/09/2017   Osteoarthritis 11/09/2017   Osteoarthritis of lumbar facet joint (Bilateral) 11/09/2017   Lumbar facet arthropathy (Bilateral) 11/09/2017   Lumbar facet syndrome (Bilateral) (L>R) 11/09/2017   Lumbar foraminal stenosis (multilevel) (Bilateral) 11/09/2017   DDD (degenerative disc disease),  lumbar 11/09/2017   Thoracic levoscoliosis 11/09/2017   Thoracic facet syndrome (Bilateral) (L>R) 11/09/2017   Thoracic facet arthropathy (Bilateral) (R>L) 11/09/2017   DDD (degenerative disc disease), thoracic 11/09/2017   Thoracolumbar IVDD 11/09/2017   DDD (degenerative disc disease), cervical 11/09/2017   Osteoarthritis of cervical facet (Bilateral) 11/09/2017   Grade 1 Anterolisthesis of C7 over T1 11/09/2017   Cervical foraminal stenosis (Bilateral) 11/09/2017   History of fusion of cervical spine (C5-6 ACDF) 11/09/2017   Cervical facet syndrome (Bilateral) 11/09/2017   Disorder of skeletal system 11/09/2017   Cervical radiculitis (Bilateral) 11/09/2017   Lumbar Epidural lipomatosis 11/09/2017   Chronic sacroiliac joint pain (Bilateral) (L>R) 11/09/2017   Chronic hip pain (Bilateral) (L>R) 11/09/2017   Chronic upper back pain (Primary Area of Pain) (midline) 10/24/2017   Chronic neck pain (Secondary Area of Pain) (Bilateral)  (L>R) 10/24/2017   Chronic low back pain (Fourth Area of Pain) (Bilateral) (L>R) 10/24/2017   Chronic lower extremity pain (Fifth Area of Pain) (Bilateral) (L>R) 10/24/2017   Lumbar Grade 1 Retrolisthesis of L1-2 and L2-3 10/24/2017   Other long term (current) drug therapy 10/24/2017   Other specified health status 10/24/2017   Long term current use of opiate analgesic 10/24/2017   Long term prescription opiate use 10/24/2017   Opiate use 10/24/2017   DM type 2 (diabetes mellitus, type 2) ([place]) 06/14/2014   Pharmacologic therapy 06/14/2014   Pedal edema    Post-menopausal 06/23/2012   Lumbar disc disease with radiculopathy 11/18/2011   HTN (hypertension) 07/30/2011   Raynaud disease 07/30/2011   Obesity 07/30/2011   Routine general medical examination at a health care facility 03/28/2011   Palpitations 04/27/2010   Problems influencing health status 12/11/2009   Depression with anxiety 06/14/2008  Vitamin D deficiency 05/13/2008   ANXIETY  03/25/2008   Osteopenia 03/25/2008   Hypothyroidism 03/28/2007   Hyperlipidemia associated with type 2 diabetes mellitus ([place]) 03/28/2007   Other iron deficiency anemias 03/28/2007   PANIC ATTACK 03/28/2007   KERATOCONJUNCTIVITIS SICCA 03/28/2007   Mitral valve disorder 03/28/2007   ABNORMAL HEART RHYTHMS 03/28/2007   Raynaud's syndrome 03/28/2007   GERD 03/28/2007   IBS 03/28/2007   Rosacea 03/28/2007   PLANTAR FASCIITIS 03/28/2007   MIGRAINES, HX OF 03/28/2007    Past Medical History:  Diagnosis Date   Acute renal insufficiency 05/31/2014   pt not aware of this diagnosis   Anemia, iron deficiency    Anxiety    Bilateral lower extremity edema    a. uses torsemide   Cervical dysplasia    abnormal paps   Colon cancer screening 06/14/2014   Coronary artery disease    Cough 08/08/2012   Degenerative disk disease 11/19/2011   Depression    Diabetes mellitus type II    Diverticulosis    DJD (degenerative joint disease)    Drug rash 05/22/2011   Dry eyes    Dysrhythmia    metoprolol.   Edema    Elevated liver enzymes 03/21/2012   Encounter for routine gynecological examination 06/14/2014   ESOPHAGITIS 03/28/2007   Qualifier: Hospitalized for  By: Marcelino Scot CMA, [place]     Fibromyalgia    GASTRITIS 03/28/2007   Qualifier: History of  By: Marcelino Scot CMA, [place] Bilberry     GERD (gastroesophageal reflux disease)    Hemorrhoids    external   HLD (hyperlipidemia)    HNP (herniated nucleus pulposus) 11/1997   T6,7,8 with DJD   HTN (hypertension)    Hyperglycemia 05/13/2008   Qualifier: Diagnosis of  By: Glori Bickers MD, Carmell [place]    Hypothyroidism    Interstitial lung disease ([place])    pt not aware of this   Left ovarian cyst    x 3, rupture   Osteoarthritis    hands   Osteopenia    mild-11/01; improved 12/05   Other screening mammogram 08/18/2011   Palpitations    PERSONAL HISTORY ALLERGY UNSPEC MEDICINAL AGENT 03/18/2010   Qualifier: Diagnosis of  By: Glori Bickers MD, Carmell [place]    PONV (postoperative nausea and vomiting)    Raynaud's disease    Recurrent HSV (herpes simplex virus)    lesions in nose or mouth with frequent ulcers. d/t meds and dry mouth   Rheumatoid arthritis ([place])    Rhinitis 03/21/2012   Routine general medical examination at a health care facility 03/28/2011    Family History  Problem Relation Age of Onset   Emphysema Father        + smoker   Lung cancer Mother        + smoker   Coronary artery disease Mother        relatively young   Lymphoma Sister    Heart disease Brother    Lymphoma Brother    Parkinson's disease Brother    Anemia Brother        aplastic    Diabetes Brother    Leukemia Son    Iron deficiency Daughter    Breast cancer Neg Hx    Past Surgical History:  Procedure Laterality Date   ABDOMINAL EXPLORATION SURGERY     ACDF  1991   BIOPSY OF SKIN SUBCUTANEOUS TISSUE AND/OR MUCOUS MEMBRANE Left 04/08/2021   Procedure: BIOPSY OF SKIN SUBCUTANEOUS TISSUE AND/OR MUCOUS MEMBRANE ( REMOVAL  SKIN NODULE);  Surgeon: Katha Cabal, MD;  Location: [facility] ORS;  Service: Vascular;  Laterality: Left;   COLONOSCOPY  08/2000   Diverticulosis; hemorrhoids   HAND SURGERY Left    left thumb. PIN REMOVED   LASIK     bilateral   Social History   Social History Narrative   Married1 Investment banker, corporate to dean at [employer] regularly exerciseDaily caffeine use: 2/day.   Lives with husband. Feels safe in her home.   Immunization History  Administered Date(s) Administered   Fluad Quad(high Dose 65+) 08/07/2020   Influenza Split 06/19/2011   Influenza Whole 08/01/2009   Influenza, High Dose Seasonal PF 08/14/2018, 07/03/2019   Influenza,inj,Quad PF,6+ Mos 06/14/2014, 08/25/2015, 07/26/2016, 08/19/2017   Moderna Sars-Covid-2 Vaccination 09/17/2020   PFIZER(Purple Top)SARS-COV-2 Vaccination 11/27/2019, 12/18/2019   Pneumococcal Conjugate-13 08/25/2015   Pneumococcal Polysaccharide-23 08/25/2016   Td 11/25/2003   Tdap 06/14/2014    Zoster, Live 09/16/2014     Objective: Vital Signs: There were no vitals taken for this visit.   Physical Exam   Musculoskeletal Exam: ***  CDAI Exam: CDAI Score: -- Patient Global: --; Provider Global: -- Swollen: --; Tender: -- Joint Exam 06/17/2021   No joint exam has been documented for this visit   There is currently no information documented on the homunculus. Go to the Rheumatology activity and complete the homunculus joint exam.  Investigation: No additional findings.  Imaging: No results found.  Recent Labs: Lab Results  Component Value Date   WBC 8.9 04/07/2021   HGB 11.1 (L) 04/07/2021   PLT 346 04/07/2021   NA 137 04/07/2021   K 4.5 04/07/2021   CL 101 04/07/2021   CO2 30 04/07/2021   GLUCOSE 135 (H) 04/07/2021   BUN 34 (H) 04/07/2021   CREATININE 0.91 04/07/2021   BILITOT 0.3 03/24/2021   ALKPHOS 81 02/06/2021   AST 20 03/24/2021   ALT 20 03/24/2021   PROT 7.4 03/24/2021   ALBUMIN 4.6 02/06/2021   CALCIUM 8.7 (L) 04/07/2021   GFRAA 75 03/24/2021   QFTBGOLDPLUS NEGATIVE 01/13/2021    Speciality Comments: No specialty comments available.  Procedures:  No procedures performed Allergies: Ceftin [cefuroxime], Clonidine derivatives, Erythromycin, Keflex [cephalexin], Penicillins, Sulfonamide derivatives, Ciprofloxacin, Fluoxetine hcl, Furosemide, Gabapentin, Paroxetine, Pregabalin, Tetracycline, Venlafaxine, Etodolac, Amitriptyline hcl, Atorvastatin, Benicar [olmesartan medoxomil], Cephalexin, Cetirizine hcl, Diovan [valsartan], and Naproxen sodium   Assessment / Plan:     Visit Diagnoses: No diagnosis found.  Orders: No orders of the defined types were placed in this encounter.  No orders of the defined types were placed in this encounter.   Face-to-face time spent with patient was *** minutes. Greater than 50% of time was spent in counseling and coordination of care.  Follow-Up Instructions: No follow-ups on file.   Earnestine Mealing,  CMA  Note - This record has been created using Editor, commissioning.  Chart creation errors have been sought, but may not always  have been located. Such creation errors do not reflect on  the standard of medical care.

## 2021-06-04 ENCOUNTER — Encounter (INDEPENDENT_AMBULATORY_CARE_PROVIDER_SITE_OTHER): Payer: Self-pay | Admitting: Nurse Practitioner

## 2021-06-04 ENCOUNTER — Other Ambulatory Visit: Payer: Self-pay

## 2021-06-04 ENCOUNTER — Ambulatory Visit (INDEPENDENT_AMBULATORY_CARE_PROVIDER_SITE_OTHER): Payer: Medicare Other | Admitting: Nurse Practitioner

## 2021-06-04 VITALS — BP 111/70 | HR 75 | Resp 16

## 2021-06-04 DIAGNOSIS — R600 Localized edema: Secondary | ICD-10-CM | POA: Diagnosis not present

## 2021-06-04 DIAGNOSIS — M7989 Other specified soft tissue disorders: Secondary | ICD-10-CM

## 2021-06-04 NOTE — Progress Notes (Signed)
History of Present Illness  There is no documented history at this time  Assessments & Plan   There are no diagnoses linked to this encounter.    Additional instructions  Subjective:  Patient presents with venous ulcer of the Left lower extremity.    Procedure:  3 layer unna wrap was placed Left lower extremity.   Plan:   Follow up in one week.  

## 2021-06-05 ENCOUNTER — Encounter: Payer: Self-pay | Admitting: Family Medicine

## 2021-06-08 ENCOUNTER — Encounter: Payer: Self-pay | Admitting: Rheumatology

## 2021-06-09 MED ORDER — PREDNISONE 5 MG PO TABS: ORAL_TABLET | ORAL | 0 refills | Status: DC

## 2021-06-09 NOTE — Telephone Encounter (Signed)
Please call in a prednisone taper starting at 10 mg and taper by 2.5 mg every 4 days.  Please schedule an earlier appointment with Lovena Le to discuss treatment options.

## 2021-06-09 NOTE — Telephone Encounter (Signed)
Spoke with patient and tentatively scheduled her for 06/10/2021 at 8:40 am. Patient will call the office if she is unable to find transportation.

## 2021-06-09 NOTE — Progress Notes (Signed)
Office Visit Note  Patient: Tina Berger             Date of Birth: [birth year]           MRN: [id number]             PCP: Abner Greenspan, MD Referring: Tower, [name], MD Visit Date: 06/10/2021 Occupation: '@GUAROCC'$ @  Subjective:  Discuss treatment options   History of Present Illness: Tina Berger is a 70 y.o. female with history of seropositive rheumatoid arthritis and ILD.  Patient presents today to discuss treatment options.  She previously had an intolerance to Imuran.  She does not require anti-fibrotic therapy at this time. She has ongoing pain in both hands and both feet.  She has been wearing a right wrist brace for support.  She has swelling in both hands and both feet.  She started taking a prednisone taper today, which typically alleviates her joint pain and swelling.  Patient reports she underwent vascular surgery on 04/08/21.  She has a non-healing venous ulcer of the left lower extremity.  She has been following up on a weekly basis for wound care.  A 3 layer unna wrap was placed on 06/04/21.     Activities of Daily Living:  Patient reports morning stiffness for all day. Patient Reports nocturnal pain.  Difficulty dressing/grooming: Reports Difficulty climbing stairs: Reports Difficulty getting out of chair: Reports Difficulty using hands for taps, buttons, cutlery, and/or writing: Reports  Review of Systems  Constitutional:  Positive for fatigue.  HENT:  Positive for mouth sores, mouth dryness and nose dryness.   Eyes:  Positive for dryness. Negative for pain, itching and visual disturbance.  Respiratory:  Negative for cough, hemoptysis, shortness of breath and difficulty breathing.   Cardiovascular:  Negative for chest pain, palpitations, hypertension and swelling in legs/feet.  Gastrointestinal:  Positive for constipation. Negative for blood in stool and diarrhea.  Endocrine: Negative for increased urination.  Genitourinary:  Negative for difficulty urinating  and painful urination.  Musculoskeletal:  Positive for joint pain, joint pain, joint swelling, myalgias, morning stiffness, muscle tenderness and myalgias. Negative for muscle weakness.  Skin:  Negative for color change, pallor, rash, hair loss, nodules/bumps, redness, skin tightness, ulcers and sensitivity to sunlight.  Allergic/Immunologic: Negative for susceptible to infections.  Neurological:  Positive for numbness. Negative for dizziness, headaches and weakness.  Hematological:  Positive for bruising/bleeding tendency. Negative for swollen glands.  Psychiatric/Behavioral:  Negative for depressed mood and sleep disturbance. The patient is not nervous/anxious.    PMFS History:  Patient Active Problem List   Diagnosis Date Noted   Rheumatoid arthritis ([place]) 05/27/2021   Chronic venous insufficiency 05/23/2021   AVM (arteriovenous malformation) 04/04/2021   CAD (coronary artery disease) 01/22/2021   Primary osteoarthritis of both knees 01/12/2021   Positive ANA (antinuclear antibody) 11/17/2020   Joint pain 11/13/2020   Elevated serum creatinine 08/26/2020   Current use of proton pump inhibitor 08/26/2020   Estrogen deficiency 08/21/2018   TMJ (dislocation of temporomandibular joint), initial encounter 05/05/2018   Anterior neck pain 05/05/2018   Chronic pain syndrome 11/09/2017   Chronic upper extremity pain [facility] Area of Pain) (Bilateral) (L>R) 11/09/2017   Fibromyalgia syndrome 11/09/2017   Osteoarthritis 11/09/2017   Osteoarthritis of lumbar facet joint (Bilateral) 11/09/2017   Lumbar facet arthropathy (Bilateral) 11/09/2017   Lumbar facet syndrome (Bilateral) (L>R) 11/09/2017   Lumbar foraminal stenosis (multilevel) (Bilateral) 11/09/2017   DDD (degenerative disc disease), lumbar 11/09/2017   Thoracic  levoscoliosis 11/09/2017   Thoracic facet syndrome (Bilateral) (L>R) 11/09/2017   Thoracic facet arthropathy (Bilateral) (R>L) 11/09/2017   DDD (degenerative disc disease),  thoracic 11/09/2017   Thoracolumbar IVDD 11/09/2017   DDD (degenerative disc disease), cervical 11/09/2017   Osteoarthritis of cervical facet (Bilateral) 11/09/2017   Grade 1 Anterolisthesis of C7 over T1 11/09/2017   Cervical foraminal stenosis (Bilateral) 11/09/2017   History of fusion of cervical spine (C5-6 ACDF) 11/09/2017   Cervical facet syndrome (Bilateral) 11/09/2017   Disorder of skeletal system 11/09/2017   Cervical radiculitis (Bilateral) 11/09/2017   Lumbar Epidural lipomatosis 11/09/2017   Chronic sacroiliac joint pain (Bilateral) (L>R) 11/09/2017   Chronic hip pain (Bilateral) (L>R) 11/09/2017   Chronic upper back pain (Primary Area of Pain) (midline) 10/24/2017   Chronic neck pain (Secondary Area of Pain) (Bilateral)  (L>R) 10/24/2017   Chronic low back pain (Fourth Area of Pain) (Bilateral) (L>R) 10/24/2017   Chronic lower extremity pain (Fifth Area of Pain) (Bilateral) (L>R) 10/24/2017   Lumbar Grade 1 Retrolisthesis of L1-2 and L2-3 10/24/2017   Other long term (current) drug therapy 10/24/2017   Other specified health status 10/24/2017   Long term current use of opiate analgesic 10/24/2017   Long term prescription opiate use 10/24/2017   Opiate use 10/24/2017   DM type 2 (diabetes mellitus, type 2) ([place]) 06/14/2014   Pharmacologic therapy 06/14/2014   Pedal edema    Post-menopausal 06/23/2012   Lumbar disc disease with radiculopathy 11/18/2011   HTN (hypertension) 07/30/2011   Raynaud disease 07/30/2011   Obesity 07/30/2011   Routine general medical examination at a health care facility 03/28/2011   Palpitations 04/27/2010   Problems influencing health status 12/11/2009   Depression with anxiety 06/14/2008   Vitamin D deficiency 05/13/2008   ANXIETY 03/25/2008   Osteopenia 03/25/2008   Hypothyroidism 03/28/2007   Hyperlipidemia associated with type 2 diabetes mellitus ([place]) 03/28/2007   Other iron deficiency anemias 03/28/2007   PANIC ATTACK 03/28/2007    KERATOCONJUNCTIVITIS SICCA 03/28/2007   Mitral valve disorder 03/28/2007   ABNORMAL HEART RHYTHMS 03/28/2007   Raynaud's syndrome 03/28/2007   GERD 03/28/2007   IBS 03/28/2007   Rosacea 03/28/2007   PLANTAR FASCIITIS 03/28/2007   MIGRAINES, HX OF 03/28/2007    Past Medical History:  Diagnosis Date   Acute renal insufficiency 05/31/2014   pt not aware of this diagnosis   Anemia, iron deficiency    Anxiety    Bilateral lower extremity edema    a. uses torsemide   Cervical dysplasia    abnormal paps   Colon cancer screening 06/14/2014   Coronary artery disease    Cough 08/08/2012   Degenerative disk disease 11/19/2011   Depression    Diabetes mellitus type II    Diverticulosis    DJD (degenerative joint disease)    Drug rash 05/22/2011   Dry eyes    Dysrhythmia    metoprolol.   Edema    Elevated liver enzymes 03/21/2012   Encounter for routine gynecological examination 06/14/2014   ESOPHAGITIS 03/28/2007   Qualifier: Hospitalized for  By: Marcelino Scot CMA, [place]     Fibromyalgia    GASTRITIS 03/28/2007   Qualifier: History of  By: Marcelino Scot CMA, [place] Bilberry     GERD (gastroesophageal reflux disease)    Hemorrhoids    external   HLD (hyperlipidemia)    HNP (herniated nucleus pulposus) 11/1997   T6,7,8 with DJD   HTN (hypertension)    Hyperglycemia 05/13/2008   Qualifier: Diagnosis of  ByGlori Bickers MD, Carmell [place]    Hypothyroidism    Interstitial lung disease [facility])    pt not aware of this   Left ovarian cyst    x 3, rupture   Osteoarthritis    hands   Osteopenia    mild-11/01; improved 12/05   Other screening mammogram 08/18/2011   Palpitations    PERSONAL HISTORY ALLERGY UNSPEC MEDICINAL AGENT 03/18/2010   Qualifier: Diagnosis of  By: Glori Bickers MD, Carmell Austria    PONV (postoperative nausea and vomiting)    Raynaud's disease    Recurrent HSV (herpes simplex virus)    lesions in nose or mouth with frequent ulcers. d/t meds and dry mouth   Rheumatoid arthritis ([place])     Rhinitis 03/21/2012   Routine general medical examination at a health care facility 03/28/2011    Family History  Problem Relation Age of Onset   Emphysema Father        + smoker   Lung cancer Mother        + smoker   Coronary artery disease Mother        relatively young   Lymphoma Sister    Heart disease Brother    Lymphoma Brother    Parkinson's disease Brother    Anemia Brother        aplastic    Diabetes Brother    Leukemia Son    Iron deficiency Daughter    Breast cancer Neg Hx    Past Surgical History:  Procedure Laterality Date   ABDOMINAL EXPLORATION SURGERY     ACDF  1991   BIOPSY OF SKIN SUBCUTANEOUS TISSUE AND/OR MUCOUS MEMBRANE Left 04/08/2021   Procedure: BIOPSY OF SKIN SUBCUTANEOUS TISSUE AND/OR MUCOUS MEMBRANE ( REMOVAL SKIN NODULE);  Surgeon: Katha Cabal, MD;  Location: [facility] ORS;  Service: Vascular;  Laterality: Left;   COLONOSCOPY  08/2000   Diverticulosis; hemorrhoids   HAND SURGERY Left    left thumb. PIN REMOVED   LASIK     bilateral   Social History   Social History Narrative   Married1 Investment banker, corporate to dean at [employer] regularly exerciseDaily caffeine use: 2/day.   Lives with husband. Feels safe in her home.   Immunization History  Administered Date(s) Administered   Fluad Quad(high Dose 65+) 08/07/2020   Influenza Split 06/19/2011   Influenza Whole 08/01/2009   Influenza, High Dose Seasonal PF 08/14/2018, 07/03/2019   Influenza,inj,Quad PF,6+ Mos 06/14/2014, 08/25/2015, 07/26/2016, 08/19/2017   Moderna Sars-Covid-2 Vaccination 09/17/2020   PFIZER(Purple Top)SARS-COV-2 Vaccination 11/27/2019, 12/18/2019   Pneumococcal Conjugate-13 08/25/2015   Pneumococcal Polysaccharide-23 08/25/2016   Td 11/25/2003   Tdap 06/14/2014   Zoster, Live 09/16/2014     Objective: Vital Signs: BP (!) 149/94 (BP Location: Left Arm, Patient Position: Sitting, Cuff Size: Normal)   Pulse 80   Ht '5\' 2"'$  (1.575 m)   Wt 205 lb (93 kg)   BMI 37.49 kg/m     Physical Exam Vitals and nursing note reviewed.  Constitutional:      Appearance: She is well-developed.  HENT:     Head: Normocephalic and atraumatic.  Eyes:     Conjunctiva/sclera: Conjunctivae normal.  Pulmonary:     Effort: Pulmonary effort is normal.  Abdominal:     Palpations: Abdomen is soft.  Musculoskeletal:     Cervical back: Normal range of motion.  Skin:    General: Skin is warm and dry.     Capillary Refill: Capillary refill takes less than 2 seconds.  Neurological:  Mental Status: She is alert and oriented to person, place, and time.  Psychiatric:        Behavior: Behavior normal.     Musculoskeletal Exam: C-spine limited ROM with lateral rotation.  Shoulder joints and elbow joints have good ROM with no discomfort.  Limited extension, tenderness, and swelling of both wrist joints.  Tenderness and synovitis of several MCP and PIP joints as described below.  Incomplete fist formation.  Hip joints difficult to assess in seated position.  Knee joints have good ROM with no discomfort.  No warmth or effusion of knee joints.  Pedal edema noted bilaterally.  Tenderness over all MTP joints.   CDAI Exam: CDAI Score: 29.6  Patient Global: 8 mm; Provider Global: 8 mm Swollen: 16 ; Tender: 29  Joint Exam 06/10/2021      Right  Left  Wrist  Swollen Tender  Swollen Tender  MCP 1  Swollen Tender   Tender  MCP 2   Tender   Tender  MCP 3  Swollen Tender   Tender  MCP 4  Swollen Tender   Tender  MCP 5  Swollen Tender   Tender  IP  Swollen Tender     PIP 2  Swollen Tender  Swollen Tender  PIP 3  Swollen Tender  Swollen Tender  Ankle   Tender   Tender  MTP 1   Tender   Tender  MTP 2  Swollen Tender   Tender  MTP 3  Swollen Tender  Swollen Tender  MTP 4   Tender  Swollen Tender  MTP 5   Tender  Swollen Tender     Investigation: No additional findings.  Imaging: No results found.  Recent Labs: Lab Results  Component Value Date   WBC 8.9 04/07/2021   HGB 11.1  (L) 04/07/2021   PLT 346 04/07/2021   NA 137 04/07/2021   K 4.5 04/07/2021   CL 101 04/07/2021   CO2 30 04/07/2021   GLUCOSE 135 (H) 04/07/2021   BUN 34 (H) 04/07/2021   CREATININE 0.91 04/07/2021   BILITOT 0.3 03/24/2021   ALKPHOS 81 02/06/2021   AST 20 03/24/2021   ALT 20 03/24/2021   PROT 7.4 03/24/2021   ALBUMIN 4.6 02/06/2021   CALCIUM 8.7 (L) 04/07/2021   GFRAA 75 03/24/2021   QFTBGOLDPLUS NEGATIVE 01/13/2021    Speciality Comments: No specialty comments available.  Procedures:  No procedures performed Allergies: Ceftin [cefuroxime], Clonidine derivatives, Erythromycin, Keflex [cephalexin], Penicillins, Sulfonamide derivatives, Ciprofloxacin, Fluoxetine hcl, Furosemide, Gabapentin, Paroxetine, Pregabalin, Tetracycline, Venlafaxine, Etodolac, Amitriptyline hcl, Atorvastatin, Benicar [olmesartan medoxomil], Cephalexin, Cetirizine hcl, Diovan [valsartan], and Naproxen sodium   Assessment / Plan:     Visit Diagnoses: Rheumatoid arthritis of multiple sites with negative rheumatoid factor ([place]): She presents today with pain and synovitis in multiple joints as described above.  She has been experiencing Severe pain, stiffness, and swelling in both hands and both feet.  She started taking a prednisone taper this morning starting at 10 mg and she will be tapering by 2.5 mg every 4 days.  She is not taking immunosuppressive agents at this time.  She previously had GI intolerance to Imuran and discontinued.  She presents today to discuss different treatment options.  Unfortunately she has a nonhealing venous ulcer on her left lower extremity and has been following up on a weekly basis with vascular surgery for wound care.  Discussed that she will be unable to take any immunosuppressive agents until the ulcer has completely cleared and she  has clearance from her vascular surgeon. In anticipation to start on therapy different treatment options were discussed today in detail.  Due to her history  of ILD methotrexate and TNF inhibitors will be avoided at this time.  Her ILD is mild and has very little progression since 2013 and she is not experiencing any symptoms currently so antifibrotic therapy is not indicated at this time.  She will continue to have spirometry and DLCO every 6 months as well as following along closely with Dr. Chase Caller. The indications, contraindications, potential side effects of Orencia 125 mg subcutaneous injections every week were discussed with the patient today.  All questions were addressed and consent was obtained.  We will apply for Orencia through her insurance and once approved as well as cleared by her vascular surgeon she will return to the office for administration of the first injection. We will need written clearance by her vascular surgeon for her to start on immunosuppressive therapy in the future. She has had all baseline immunosuppressive labs but will require updated CBC and CMP prior to initiating Orencia.  Standing orders for CBC and CMP remain in place. She will complete the current prednisone taper.  She was advised to notify us if her joint pain and swelling persists or worsens after the taper.  She is unable to take high-dose prednisone due to type 2 diabetes. She will follow up 6 to 8 weeks after initiating Orencia.  Medication counseling:  TB Gold: 01/13/21 Hepatitis panel: negative 01/13/21 HIV: negative 01/13/21 SPEP: 01/13/21: no abnormal protein bands  Immunoglobulins: WNL 01/13/21  Does patient have a diagnosis of COPD? No  Counseled patient that Maureen Chatters is a selective T-cell costimulation blocker indicated for RA.  Counseled patient on purpose, proper use, and adverse effects of Orencia. The most common adverse effects are increased risk of infections, headache, and infusion reactions.  There is the possibility of an increased risk of malignancy but it is not well understood if this increased risk is due to the medication or the disease  state.  Reviewed the importance of regular labs while on Orencia therapy.  Counseled patient that Maureen Chatters should be held prior to scheduled surgery.  Counseled patient to avoid live vaccines while on Orencia.  Advised patient to get annual influenza vaccine and the pneumococcal vaccine as indicated.  Provided patient with medication education material and answered all questions.  Patient consented to [facility].  Will upload consent into patient's chart.  Will submit benefit's investigation for Orencia.  High risk medication use - D/c Imuran-GI intolerance.  She is not a good candidate for methotrexate or TNF inhibitors due to history of ILD. We will be applying for Orencia 125 mg sq injections once weekly.   She has had all baseline immunosuppressive lab work.   TB gold negative 01/13/21 and will continue to be monitored yearly. CBC and CMP will need to be updated prior to initiating therapy. Standing orders for CBC and CMP are in place. We discussed the importance of holding Orencia in the future if she develops signs or symptoms of an infection and to resume once the infection has completely cleared.  ILD (interstitial lung disease) ([place]) - Followed by Dr. Chase Caller. Office visit note from 06/02/21 was reviewed today in the office. Very mild progression from 2013-2022. Stable w/ minimal symptoms- Antifibrotic therapy is not indicated at this time. Repeat PFTS/DLCO in 6 months.  Raynaud's disease without gangrene: She continues to have intermittent symptoms of raynaud's.   Hx of arteriovenous malformation (  AVM): Status post removal of AV malformation left leg.  She developed a nonhealing venous ulcer which has been being followed closely by Dr. Delana Meyer.  Unna boot therapy was initiated.  She has been going for weekly wound checks. She will require written clearance prior to initiating Orencia.  We discussed that she cannot take an immunosuppressive agent until the ulcer has completely healed and she has no  other signs of infection.  Venous ulcer of left leg [facility]): Non-healing venous ulcer of left lower extremity status post removal of AV malformation on 04/08/21.  She has been following up closely with Dr. Delana Meyer.  Wound checks on a weekly basis.  Unna wrap currently in place.  She will require written clearance prior to initiating immunosuppressive therapy.  Primary osteoarthritis of both knees: She has good ROM of both knee joints on exam today.  No warmth or effusion noted.   DDD (degenerative disc disease), cervical: Chronic pain and stiffness.  She has limited range of motion with lateral rotation.  History of fusion of cervical spine (C5-6 ACDF): Chronic pain and stiffness  DDD (degenerative disc disease), thoracic: Chronic pain  DDD (degenerative disc disease), lumbar: Chronic pain  Fibromyalgia syndrome: She continues to have generalized hyperalgesia and positive tender points on examination.  She remains on Cymbalta, Flexeril, and hydromorphone as needed for pain relief.  Chronic pain syndrome: She remains on hydromorphone HCl 12 mg by mouth 3 times daily and oxycodone 10 mg 3 times daily as needed for breakthrough pain.  Type 2 diabetes mellitus without complication, without long-term current use of insulin ([place]): She is not a good candidate for high dose prednisone. Advised to monitor blood sugar closely while taking low dose prednisone taper.   Chronic renal impairment, stage 3a ([place]): Creatinine was within normal limits and GFR was greater than 60 on 04/07/2021.  She will require an updated CMP with GFR prior to starting on Orencia.  She is not a good candidate for NSAIDs.  Long term current use of opiate analgesic: Followed by pain management.  Other medical conditions are listed as follows:   Primary hypertension  Mitral valve disorder  Hyperlipidemia associated with type 2 diabetes mellitus ([place])  History of hypothyroidism  History of gastroesophageal reflux  (GERD)  Depression with anxiety  History of IBS  Orders: No orders of the defined types were placed in this encounter.  No orders of the defined types were placed in this encounter.     Follow-Up Instructions: Return in about 3 months (around 09/10/2021) for Rheumatoid arthritis, ILD.   Ofilia Neas, PA-C  Note - This record has been created using Dragon software.  Chart creation errors have been sought, but may not always  have been located. Such creation errors do not reflect on  the standard of medical care.

## 2021-06-10 ENCOUNTER — Other Ambulatory Visit: Payer: Self-pay

## 2021-06-10 ENCOUNTER — Telehealth: Payer: Self-pay | Admitting: Pharmacist

## 2021-06-10 ENCOUNTER — Encounter: Payer: Self-pay | Admitting: Physician Assistant

## 2021-06-10 ENCOUNTER — Ambulatory Visit: Payer: Medicare Other | Admitting: Physician Assistant

## 2021-06-10 VITALS — BP 149/94 | HR 80 | Ht 62.0 in | Wt 205.0 lb

## 2021-06-10 DIAGNOSIS — M797 Fibromyalgia: Secondary | ICD-10-CM

## 2021-06-10 DIAGNOSIS — Z981 Arthrodesis status: Secondary | ICD-10-CM | POA: Diagnosis not present

## 2021-06-10 DIAGNOSIS — Z8639 Personal history of other endocrine, nutritional and metabolic disease: Secondary | ICD-10-CM

## 2021-06-10 DIAGNOSIS — Z8774 Personal history of (corrected) congenital malformations of heart and circulatory system: Secondary | ICD-10-CM

## 2021-06-10 DIAGNOSIS — I83029 Varicose veins of left lower extremity with ulcer of unspecified site: Secondary | ICD-10-CM | POA: Diagnosis not present

## 2021-06-10 DIAGNOSIS — N1831 Chronic kidney disease, stage 3a: Secondary | ICD-10-CM

## 2021-06-10 DIAGNOSIS — E119 Type 2 diabetes mellitus without complications: Secondary | ICD-10-CM

## 2021-06-10 DIAGNOSIS — M5030 Other cervical disc degeneration, unspecified cervical region: Secondary | ICD-10-CM

## 2021-06-10 DIAGNOSIS — Z79891 Long term (current) use of opiate analgesic: Secondary | ICD-10-CM

## 2021-06-10 DIAGNOSIS — M170 Bilateral primary osteoarthritis of knee: Secondary | ICD-10-CM

## 2021-06-10 DIAGNOSIS — M0609 Rheumatoid arthritis without rheumatoid factor, multiple sites: Secondary | ICD-10-CM | POA: Diagnosis not present

## 2021-06-10 DIAGNOSIS — M51369 Other intervertebral disc degeneration, lumbar region without mention of lumbar back pain or lower extremity pain: Secondary | ICD-10-CM

## 2021-06-10 DIAGNOSIS — M5136 Other intervertebral disc degeneration, lumbar region: Secondary | ICD-10-CM | POA: Diagnosis not present

## 2021-06-10 DIAGNOSIS — M5134 Other intervertebral disc degeneration, thoracic region: Secondary | ICD-10-CM

## 2021-06-10 DIAGNOSIS — I7300 Raynaud's syndrome without gangrene: Secondary | ICD-10-CM

## 2021-06-10 DIAGNOSIS — L97929 Non-pressure chronic ulcer of unspecified part of left lower leg with unspecified severity: Secondary | ICD-10-CM

## 2021-06-10 DIAGNOSIS — J849 Interstitial pulmonary disease, unspecified: Secondary | ICD-10-CM | POA: Diagnosis not present

## 2021-06-10 DIAGNOSIS — E785 Hyperlipidemia, unspecified: Secondary | ICD-10-CM

## 2021-06-10 DIAGNOSIS — I059 Rheumatic mitral valve disease, unspecified: Secondary | ICD-10-CM

## 2021-06-10 DIAGNOSIS — E1169 Type 2 diabetes mellitus with other specified complication: Secondary | ICD-10-CM

## 2021-06-10 DIAGNOSIS — Z79899 Other long term (current) drug therapy: Secondary | ICD-10-CM | POA: Diagnosis not present

## 2021-06-10 DIAGNOSIS — I10 Essential (primary) hypertension: Secondary | ICD-10-CM

## 2021-06-10 DIAGNOSIS — Z8719 Personal history of other diseases of the digestive system: Secondary | ICD-10-CM

## 2021-06-10 DIAGNOSIS — F418 Other specified anxiety disorders: Secondary | ICD-10-CM

## 2021-06-10 DIAGNOSIS — G894 Chronic pain syndrome: Secondary | ICD-10-CM

## 2021-06-10 NOTE — Patient Instructions (Signed)
Abatacept solution for injection (subcutaneous or intravenous use) What is this medication? ABATACEPT (a ba TA sept) is used to treat rheumatoid arthritis, psoriatic arthritis, and juvenile idiopathic arthritis. It prevents acutegraft-versus-host disease following a stem-cell transplant. This medicine may be used for other purposes; ask your health care provider orpharmacist if you have questions. COMMON BRAND NAME(S): Orencia What should I tell my care team before I take this medication? They need to know if you have any of these conditions: cancer diabetes hepatitis B or history of hepatitis B infection immune system problems infection or history of infection (especially a virus infection such as chickenpox, cold sores, or herpes) lung or breathing problems, like chronic obstructive pulmonary disease (COPD) recently received or scheduled to receive a vaccination scheduled to have surgery tuberculosis, a positive skin test for tuberculosis, or have recently been in close contact with someone who has tuberculosis an unusual or allergic reaction to abatacept, other medicines, foods, dyes, or preservatives pregnant or trying to get pregnant breast-feeding How should I use this medication? This medicine is for infusion into a vein or for injection under the skin. Infusions are given by a health care professional in a hospital or clinic setting. If you are to give your own medicine at home, you will be taught how to prepare and give this medicine under the skin. Use exactly as directed. Take your medicine at regular intervals. Do not take your medicine more often thandirected. It is important that you put your used needles and syringes in a special sharps container. Do not put them in a trash can. If you do not have a sharpscontainer, call your pharmacist or health care provider to get one. Talk to your pediatrician regarding the use of this medicine in children. While infusions in a clinic may be  prescribed for children as young as 2 years forselected conditions, precautions do apply. Overdosage: If you think you have taken too much of this medicine contact apoison control center or emergency room at once. NOTE: This medicine is only for you. Do not share this medicine with others. What if I miss a dose? This medicine is used once a week if given by injection under the skin. If you miss a dose, take it as soon as you can. If it is almost time for your nextdose, take only that dose. Do not take double or extra doses. If you are to be given an infusion of this medicine, it is important not to miss your dose. Doses are usually every 4 weeks. Call your doctor or healthcare professional if you are unable to keep an appointment. What may interact with this medication? Do not take this medicine with any of the following medications: live vaccines This medicine may also interact with the following medications: anakinra baricitinib canakinumab medicines that lower your chance of fighting an infection rituximab TNF blockers such as adalimumab, certolizumab, etanercept, golimumab, infliximab tocilizumab tofacitinib upadacitinib ustekinumab This list may not describe all possible interactions. Give your health care provider a list of all the medicines, herbs, non-prescription drugs, or dietary supplements you use. Also tell them if you smoke, drink alcohol, or use illegaldrugs. Some items may interact with your medicine. What should I watch for while using this medication? Visit your doctor for regular checks on your progress. Tell your doctor or health care professional if your symptoms do not start to get better or if theyget worse. You will be tested for tuberculosis (TB) before you start this medicine. If your doctor prescribed  any medicine for TB, you should start taking the TB medicine before starting this medicine. Make sure to finish the full course ofTB medicine. This medicine may  increase your risk of getting an infection. Call your doctor or health care professional if you get fever, chills, or sore throat, or other symptoms of a cold or flu. Do not treat yourself. Try to avoid being aroundpeople who are sick. If you have diabetes and are getting this medicine in a vein, the infusion can give false high blood sugar readings on the day of your dose. This may happen if you use certain types of blood glucose tests. Your health care provider maytell you to use a different way to monitor your blood sugar levels. What side effects may I notice from receiving this medication? Side effects that you should report to your doctor or health care professionalas soon as possible: allergic reactions like skin rash, itching or hives, swelling of the face, lips, or tongue breathing problems chest pain dizziness signs and symptoms of infection like fever; chills; cough; sore throat; pain or trouble passing urine unusually weak or tired Side effects that usually do not require medical attention (report to yourdoctor or health care professional if they continue or are bothersome): diarrhea headache nausea pain, redness, or irritation at site where injected stomach pain or upset This list may not describe all possible side effects. Call your doctor for medical advice about side effects. You may report side effects to FDA at1-800-FDA-1088. Where should I keep my medication? Infusions will be given in a hospital or clinic and will not be stored at home. Storage for syringes and autoinjectors stored at home: Keep out of the reach of children. Store in a refrigerator between 2 and 8 degrees C (36 and 46 degrees F). Keep this medicine in the original container. Protect from light. Do not freeze. Do not shake. Throw away any unused medicineafter the expiration date. NOTE: This sheet is a summary. It may not cover all possible information. If you have questions about this medicine, talk to your  doctor, pharmacist, orhealth care provider.  2022 Elsevier/Gold Standard (2020-10-24 15:20:39)

## 2021-06-10 NOTE — Progress Notes (Signed)
Pharmacy Note  Subjective: Patient presents today to [facility] for follow up office visit.   Patient seen by the pharmacist for counseling on subcutaneous Orencia rheumatoid arthritis with ILD. Prior therapy includes: azathioprine (GI intolerance). She is not a good candidate for mycophenolate d/t GI intolerance to many medications.  She was seen by Dr. Chase Caller on 06/02/21 for ILD. It was discussed that her ILD progression does not necessarily warrant initiation of antifibrotic  Objective: CBC    Component Value Date/Time   WBC 8.9 04/07/2021 1015   RBC 3.74 (L) 04/07/2021 1015   HGB 11.1 (L) 04/07/2021 1015   HGB 12.7 02/06/2021 1109   HCT 34.5 (L) 04/07/2021 1015   HCT 39.0 02/06/2021 1109   PLT 346 04/07/2021 1015   PLT 327 02/06/2021 1109   MCV 92.2 04/07/2021 1015   MCV 87 02/06/2021 1109   MCV 87 03/02/2012 1923   MCH 29.7 04/07/2021 1015   MCHC 32.2 04/07/2021 1015   RDW 13.5 04/07/2021 1015   RDW 13.1 02/06/2021 1109   RDW 14.2 03/02/2012 1923   LYMPHSABS 1.7 04/07/2021 1015   LYMPHSABS 1.7 02/06/2021 1109   MONOABS 0.9 04/07/2021 1015   EOSABS 0.5 04/07/2021 1015   EOSABS 0.2 02/06/2021 1109   BASOSABS 0.1 04/07/2021 1015   BASOSABS 0.1 02/06/2021 1109    CMP     Component Value Date/Time   NA 137 04/07/2021 1015   NA 141 02/06/2021 1109   NA 135 (L) 03/06/2012 0546   K 4.5 04/07/2021 1015   K 4.9 03/06/2012 0546   CL 101 04/07/2021 1015   CL 99 03/06/2012 0546   CO2 30 04/07/2021 1015   CO2 28 03/06/2012 0546   GLUCOSE 135 (H) 04/07/2021 1015   GLUCOSE 138 (H) 03/06/2012 0546   BUN 34 (H) 04/07/2021 1015   BUN 17 02/06/2021 1109   BUN 21 (H) 03/06/2012 0546   CREATININE 0.91 04/07/2021 1015   CREATININE 0.90 03/24/2021 0930   CALCIUM 8.7 (L) 04/07/2021 1015   CALCIUM 8.4 (L) 03/06/2012 0546   PROT 7.4 03/24/2021 0930   PROT 7.4 02/06/2021 1109   PROT 6.7 03/06/2012 0546   ALBUMIN 4.6 02/06/2021 1109   ALBUMIN 2.9 (L) 03/06/2012  0546   AST 20 03/24/2021 0930   AST 36 03/06/2012 0546   ALT 20 03/24/2021 0930   ALT 143 (H) 03/06/2012 0546   ALKPHOS 81 02/06/2021 1109   ALKPHOS 94 03/06/2012 0546   BILITOT 0.3 03/24/2021 0930   BILITOT <0.2 02/06/2021 1109   BILITOT 0.3 03/06/2012 0546   GFRNONAA >60 04/07/2021 1015   GFRNONAA 64 03/24/2021 0930   GFRAA 75 03/24/2021 0930    Baseline Immunosuppressant Therapy Labs TB GOLD Quantiferon TB Gold Latest Ref Rng & Units 01/13/2021  Quantiferon TB Gold Plus NEGATIVE NEGATIVE   Hepatitis Panel Hepatitis Latest Ref Rng & Units 01/13/2021  Hep B Surface Ag NON-REACTI NON-REACTIVE  Hep B IgM NON-REACTI NON-REACTIVE  Hep C Ab NON-REACTI NON-REACTIVE  Hep C Ab NON-REACTI NON-REACTIVE   HIV Lab Results  Component Value Date   HIV NON-REACTIVE 01/13/2021   Immunoglobulins Immunoglobulin Electrophoresis Latest Ref Rng & Units 01/13/2021  IgA  70 - 320 mg/dL 156  IgG 600 - 1,540 mg/dL 1,246  IgM 50 - 300 mg/dL 92   SPEP Serum Protein Electrophoresis Latest Ref Rng & Units 03/24/2021  Total Protein 6.1 - 8.1 g/dL 7.4  Albumin 3.8 - 4.8 g/dL -  Alpha-1 0.2 - 0.3 g/dL -  Alpha-2 0.5 - 0.9 g/dL -  Beta Globulin 0.4 - 0.6 g/dL -  Beta 2 0.2 - 0.5 g/dL -  Gamma Globulin 0.8 - 1.7 g/dL -   G6PD No results found for: G6PDH TPMT Lab Results  Component Value Date   TPMT 20 01/13/2021    HRCT on 01/16/21: appearance of the lungs is compatible with interstitial lung disease which is mildly progressive when compared to remote prior study from 9 years ago. The spectrum of findings on today's examination is categorized as probable usual interstitial pneumonia (UIP) per current ATS guidelines  Does patient have a diagnosis of COPD? No  Does patient have history of diverticulitis?  No  Assessment/Plan:  Counseled patient that Maureen Chatters is a selective T-cell costimulation blocker.  Counseled patient on purpose, proper use, and adverse effects of Orencia. The most common  adverse effects are increased risk of infections, headache, and injection site reactions.  There is the possibility of an increased risk of malignancy but it is not well understood if this increased risk is due to the medication or the disease state. Reviewed risk of GI perforation which is higher in patients with diverticulitis and diabetes.  Counseled patient that Maureen Chatters should be held prior to scheduled surgery.  Counseled patient to avoid live vaccines while on Orencia.  Recommend annual influenza, Pneumovax 23, Prevnar 13, and Shingrix as indicated.   Reviewed the importance of regular labs while on Orencia therapy. Patient will be due for labs 1 month after starting therapy. Standing orders placed. Provided patient with medication education material and answered all questions.  Patient consented to [facility].  Will upload consent into patient's chart.  Will apply for Orencia through patient's insurance.  Reviewed storage information for Orencia.  Advised initial injection must be administered in office.    Patient dose will be Orencia 125 mg every 7 days.  Prescription pending lab results and/or insurance approval. Patient took [employer] patient assistance application home with instructions to complete and return with proof of household income and 2022 out-of-pocket prescription expenses. She will have updated labs prior to starting [place] and once cleared to start by Dr. Delana Meyer, vascular surgeon who is managing ulcer that has developed in left lower leg.  Will start [place] so approval is in place by the time she is cleared by Dr. Delana Meyer to start Hubert Azure, PharmD, MPH, BCPS Clinical Pharmacist (Rheumatology and Pulmonology)

## 2021-06-10 NOTE — Telephone Encounter (Signed)
Submitted a Prior Authorization request to [facility] for [facility] via CoverMyMeds. Will update once we receive a response.   Key: [id number]

## 2021-06-10 NOTE — Telephone Encounter (Signed)
Please start Orencia BIV.  Dose: '125mg'$  every 7 days  Dx: Rheumatoid arthritis (M05.9) (Also has ILD)  Previously tried therapies: Azathioprine (GI intolerance)  Therapies patient unable to try: Cellcept (expect severe GI intolerance to medications)  Patient took [employer] patient assistance application home with instructions to complete and return with proof of household income and 2022 out-of-pocket prescription expenses. Provider portion signed and attached to med list, insurance card copy and filed in pending info folder in pharmacy office  She will have to be cleared to start [place] by Dr. Delana Meyer, vascular surgeon who is managing ulcer that has developed in left lower leg.  Once cleared, she will need updated CBC/CMP prior to starting Hubert Azure, PharmD, MPH, BCPS Clinical Pharmacist (Rheumatology and Pulmonology)

## 2021-06-11 ENCOUNTER — Ambulatory Visit (INDEPENDENT_AMBULATORY_CARE_PROVIDER_SITE_OTHER): Payer: Medicare Other | Admitting: Nurse Practitioner

## 2021-06-11 VITALS — BP 135/83 | HR 71 | Ht 62.0 in | Wt 202.0 lb

## 2021-06-11 DIAGNOSIS — M7989 Other specified soft tissue disorders: Secondary | ICD-10-CM

## 2021-06-11 DIAGNOSIS — R600 Localized edema: Secondary | ICD-10-CM | POA: Diagnosis not present

## 2021-06-11 NOTE — Telephone Encounter (Signed)
Received a fax regarding Prior Authorization from [facility] for [facility]. Authorization has been DENIED because "patient has NOT had a trial and failure/contraindication/intolerance to two of the following: Enbrel, Humira, Rinvoq, Xeljanz/Xeljanz XR".  Phone#  [phone]   Please advise if any of the medications listed above would be an acceptable substitute, or if submitting an appeal is the preferred course of action.

## 2021-06-11 NOTE — Progress Notes (Signed)
History of Present Illness  There is no documented history at this time  Assessments & Plan   There are no diagnoses linked to this encounter.    Additional instructions  Subjective:  Patient presents with venous ulcer of the Left lower extremity.    Procedure:  3 layer unna wrap was placed Left lower extremity.   Plan:   Follow up in one week.  

## 2021-06-11 NOTE — Telephone Encounter (Signed)
Will file appeal for Orencia since patient has ILD diagnosis as well  Knox Saliva, PharmD, MPH, BCPS Clinical Pharmacist (Rheumatology and Pulmonology)

## 2021-06-12 NOTE — Telephone Encounter (Signed)
Disregard duplicate message.

## 2021-06-14 ENCOUNTER — Encounter (INDEPENDENT_AMBULATORY_CARE_PROVIDER_SITE_OTHER): Payer: Self-pay | Admitting: Nurse Practitioner

## 2021-06-15 ENCOUNTER — Encounter (INDEPENDENT_AMBULATORY_CARE_PROVIDER_SITE_OTHER): Payer: Self-pay | Admitting: Nurse Practitioner

## 2021-06-15 NOTE — Telephone Encounter (Signed)
Submitted an URGENT appeal to [facility] for [facility]  Reference # [id number] Phone: [phone] Fax: [phone]  Knox Saliva, PharmD, MPH, BCPS Clinical Pharmacist (Rheumatology and Pulmonology)

## 2021-06-16 NOTE — Telephone Encounter (Signed)
[employer] calling with additional questions in reference to [place]. Please call to advise. Phone # [phone], [place].

## 2021-06-17 ENCOUNTER — Ambulatory Visit: Payer: Medicare Other | Admitting: Physician Assistant

## 2021-06-17 DIAGNOSIS — G894 Chronic pain syndrome: Secondary | ICD-10-CM

## 2021-06-17 DIAGNOSIS — I059 Rheumatic mitral valve disease, unspecified: Secondary | ICD-10-CM

## 2021-06-17 DIAGNOSIS — M0609 Rheumatoid arthritis without rheumatoid factor, multiple sites: Secondary | ICD-10-CM

## 2021-06-17 DIAGNOSIS — M5134 Other intervertebral disc degeneration, thoracic region: Secondary | ICD-10-CM

## 2021-06-17 DIAGNOSIS — Z8639 Personal history of other endocrine, nutritional and metabolic disease: Secondary | ICD-10-CM

## 2021-06-17 DIAGNOSIS — E1169 Type 2 diabetes mellitus with other specified complication: Secondary | ICD-10-CM

## 2021-06-17 DIAGNOSIS — Z79891 Long term (current) use of opiate analgesic: Secondary | ICD-10-CM

## 2021-06-17 DIAGNOSIS — Z981 Arthrodesis status: Secondary | ICD-10-CM

## 2021-06-17 DIAGNOSIS — E119 Type 2 diabetes mellitus without complications: Secondary | ICD-10-CM

## 2021-06-17 DIAGNOSIS — I7300 Raynaud's syndrome without gangrene: Secondary | ICD-10-CM

## 2021-06-17 DIAGNOSIS — M797 Fibromyalgia: Secondary | ICD-10-CM

## 2021-06-17 DIAGNOSIS — F418 Other specified anxiety disorders: Secondary | ICD-10-CM

## 2021-06-17 DIAGNOSIS — J849 Interstitial pulmonary disease, unspecified: Secondary | ICD-10-CM

## 2021-06-17 DIAGNOSIS — M170 Bilateral primary osteoarthritis of knee: Secondary | ICD-10-CM

## 2021-06-17 DIAGNOSIS — M5030 Other cervical disc degeneration, unspecified cervical region: Secondary | ICD-10-CM

## 2021-06-17 DIAGNOSIS — N1831 Chronic kidney disease, stage 3a: Secondary | ICD-10-CM

## 2021-06-17 DIAGNOSIS — M5136 Other intervertebral disc degeneration, lumbar region: Secondary | ICD-10-CM

## 2021-06-17 DIAGNOSIS — I10 Essential (primary) hypertension: Secondary | ICD-10-CM

## 2021-06-17 DIAGNOSIS — Z79899 Other long term (current) drug therapy: Secondary | ICD-10-CM

## 2021-06-17 DIAGNOSIS — Z8719 Personal history of other diseases of the digestive system: Secondary | ICD-10-CM

## 2021-06-17 NOTE — Telephone Encounter (Signed)
Faxed additional information for expedited appeal for Orencia.  Fax: [phone] Phone: [phone]  Case #: [id number]  Knox Saliva, PharmD, MPH, BCPS Clinical Pharmacist (Rheumatology and Pulmonology)

## 2021-06-18 ENCOUNTER — Other Ambulatory Visit: Payer: Self-pay

## 2021-06-18 ENCOUNTER — Encounter (INDEPENDENT_AMBULATORY_CARE_PROVIDER_SITE_OTHER): Payer: Self-pay

## 2021-06-18 ENCOUNTER — Ambulatory Visit (INDEPENDENT_AMBULATORY_CARE_PROVIDER_SITE_OTHER): Payer: Medicare Other | Admitting: Nurse Practitioner

## 2021-06-18 ENCOUNTER — Ambulatory Visit (INDEPENDENT_AMBULATORY_CARE_PROVIDER_SITE_OTHER): Payer: Medicare Other | Admitting: Vascular Surgery

## 2021-06-18 ENCOUNTER — Other Ambulatory Visit: Payer: Self-pay | Admitting: Family Medicine

## 2021-06-18 VITALS — BP 143/84 | HR 73 | Resp 16 | Wt 201.0 lb

## 2021-06-18 DIAGNOSIS — R600 Localized edema: Secondary | ICD-10-CM

## 2021-06-18 DIAGNOSIS — M7989 Other specified soft tissue disorders: Secondary | ICD-10-CM

## 2021-06-18 NOTE — Progress Notes (Signed)
History of Present Illness  There is no documented history at this time  Assessments & Plan   There are no diagnoses linked to this encounter.    Additional instructions  Subjective:  Patient presents with venous ulcer of the Left lower extremity.    Procedure:  3 layer unna wrap was placed Left lower extremity.   Plan:   Follow up in one week.  

## 2021-06-22 ENCOUNTER — Encounter (INDEPENDENT_AMBULATORY_CARE_PROVIDER_SITE_OTHER): Payer: Self-pay | Admitting: Nurse Practitioner

## 2021-06-23 ENCOUNTER — Encounter: Payer: Self-pay | Admitting: Rheumatology

## 2021-06-23 NOTE — Telephone Encounter (Signed)
Last filled on 08/26/20 #270 tabs with 3 refills but it was sent to mail order pharmacy, now local pharmacy [facility] is requesting a new Rx sent to them. Last OV was with Dr. Diona Browner on 10/23/20, no future appts., Please advise

## 2021-06-23 NOTE — Telephone Encounter (Signed)
I spoke with [facility] regarding the patients concern about patient assistance for orencia.  Dr. Estanislado Pandy recommended proceeding with actemra if the patient feels comfortable with potential adverse effects.   Devki plans on reaching out to the patient tomorrow to discuss the indications, contraindications, and potential side effects of actemra.  Written consent will be obtained at the new start visit if she agrees to proceed and if her insurance approves it.

## 2021-06-24 ENCOUNTER — Other Ambulatory Visit (HOSPITAL_COMMUNITY): Payer: Self-pay

## 2021-06-24 ENCOUNTER — Telehealth: Payer: Self-pay | Admitting: Pharmacist

## 2021-06-24 NOTE — Telephone Encounter (Signed)
Patient does not qualify for patient assistance. Reviewed Actemra in detail with her this morning. Will start Actemra BIV in separate encounter.  Knox Saliva, PharmD, MPH, BCPS Clinical Pharmacist (Rheumatology and Pulmonology)

## 2021-06-24 NOTE — Telephone Encounter (Signed)
Please assist discuss treatment options with patient.ActX genetic testing is not available.  If she would prefer we can also refer her for second opinion.

## 2021-06-24 NOTE — Telephone Encounter (Signed)
Spoke with patient regarding genetic testing. Advised that there are not necessarily any studies supporting genetic testing for medications for rheumatoid arthritis. Also advised that even if genetic testing supported use of a certain medication, that her comorbidities may prevent use.   Offered for her to see another rheumatology provider for a second opinion on treatment options. She states she may be interested in seeing [facility] for a second opinion but at this time she will see if Actemra's [facility] Patient Assistance Program is an option  Knox Saliva, PharmD, MPH, BCPS Clinical Pharmacist (Rheumatology and Pulmonology)

## 2021-06-24 NOTE — Telephone Encounter (Signed)
Pharmacy Note Subjective: Patient presents today to the [facility] for follow up office visit. Patient seen by the pharmacist for counseling on Actemra for rheumatoid arthritis. She also has ILD. She tried azathioprine and was unable to tolerate GI side effects.  Plan was to start [place] however she does not qualify for patient assistance and there are currently no rheumatoid arthritis grants open. She is most concerned about side effects today and interested in genetic testing to find the best medication option.  History of hyperlipidemia: Yes  History of diverticulitis: Yes - one episode at least 10 years ago but no recurrence since then  Objective: CBC    Component Value Date/Time   WBC 8.9 04/07/2021 1015   RBC 3.74 (L) 04/07/2021 1015   HGB 11.1 (L) 04/07/2021 1015   HGB 12.7 02/06/2021 1109   HCT 34.5 (L) 04/07/2021 1015   HCT 39.0 02/06/2021 1109   PLT 346 04/07/2021 1015   PLT 327 02/06/2021 1109   MCV 92.2 04/07/2021 1015   MCV 87 02/06/2021 1109   MCV 87 03/02/2012 1923   MCH 29.7 04/07/2021 1015   MCHC 32.2 04/07/2021 1015   RDW 13.5 04/07/2021 1015   RDW 13.1 02/06/2021 1109   RDW 14.2 03/02/2012 1923   LYMPHSABS 1.7 04/07/2021 1015   LYMPHSABS 1.7 02/06/2021 1109   MONOABS 0.9 04/07/2021 1015   EOSABS 0.5 04/07/2021 1015   EOSABS 0.2 02/06/2021 1109   BASOSABS 0.1 04/07/2021 1015   BASOSABS 0.1 02/06/2021 1109    CMP     Component Value Date/Time   NA 137 04/07/2021 1015   NA 141 02/06/2021 1109   NA 135 (L) 03/06/2012 0546   K 4.5 04/07/2021 1015   K 4.9 03/06/2012 0546   CL 101 04/07/2021 1015   CL 99 03/06/2012 0546   CO2 30 04/07/2021 1015   CO2 28 03/06/2012 0546   GLUCOSE 135 (H) 04/07/2021 1015   GLUCOSE 138 (H) 03/06/2012 0546   BUN 34 (H) 04/07/2021 1015   BUN 17 02/06/2021 1109   BUN 21 (H) 03/06/2012 0546   CREATININE 0.91 04/07/2021 1015   CREATININE 0.90 03/24/2021 0930   CALCIUM 8.7 (L) 04/07/2021 1015   CALCIUM 8.4  (L) 03/06/2012 0546   PROT 7.4 03/24/2021 0930   PROT 7.4 02/06/2021 1109   PROT 6.7 03/06/2012 0546   ALBUMIN 4.6 02/06/2021 1109   ALBUMIN 2.9 (L) 03/06/2012 0546   AST 20 03/24/2021 0930   AST 36 03/06/2012 0546   ALT 20 03/24/2021 0930   ALT 143 (H) 03/06/2012 0546   ALKPHOS 81 02/06/2021 1109   ALKPHOS 94 03/06/2012 0546   BILITOT 0.3 03/24/2021 0930   BILITOT <0.2 02/06/2021 1109   BILITOT 0.3 03/06/2012 0546   GFRNONAA >60 04/07/2021 1015   GFRNONAA 64 03/24/2021 0930   GFRAA 75 03/24/2021 0930     Baseline Immunosuppressant Therapy Labs  Quantiferon TB Gold Latest Ref Rng & Units 01/13/2021  Quantiferon TB Gold Plus NEGATIVE NEGATIVE    Hepatitis Latest Ref Rng & Units 01/13/2021  Hep B Surface Ag NON-REACTI NON-REACTIVE  Hep B IgM NON-REACTI NON-REACTIVE  Hep C Ab NON-REACTI NON-REACTIVE  Hep C Ab NON-REACTI NON-REACTIVE   Lab Results  Component Value Date   HIV NON-REACTIVE 01/13/2021   Immunoglobulin Electrophoresis Latest Ref Rng & Units 01/13/2021  IgA  70 - 320 mg/dL 156  IgG 600 - 1,540 mg/dL 1,246  IgM 50 - 300 mg/dL 92   Serum Protein Electrophoresis Latest  Ref Rng & Units 03/24/2021  Total Protein 6.1 - 8.1 g/dL 7.4  Albumin 3.8 - 4.8 g/dL -  Alpha-1 0.2 - 0.3 g/dL -  Alpha-2 0.5 - 0.9 g/dL -  Beta Globulin 0.4 - 0.6 g/dL -  Beta 2 0.2 - 0.5 g/dL -  Gamma Globulin 0.8 - 1.7 g/dL -   No results found for: G6PDH  Lab Results  Component Value Date   TPMT 20 01/13/2021    Lipid Panel Lab Results  Component Value Date   CHOL 168 08/20/2020   HDL 42.30 08/20/2020   Tucker 99 08/20/2020   LDLDIRECT 149.0 07/26/2016   TRIG 133.0 08/20/2020   CHOLHDL 4 08/20/2020    Chest x-ray: 01/16/21 - The appearance of the lungs is compatible with interstitial lung disease which is mildly progressive when compared to remote prior study from 9 years ago. The spectrum of findings on today's examination is categorized as probable usual interstitial pneumonia  (UIP) per current ATS guidelines.  Assessment/Plan:  We reviewed in detail that genetic testing may suggest a "best therapy" option however with rheumatoid arthritis medications, decision is heavily swayed based on comorbidities. I also reviewed that there are no studies suggesting genetic testing as a standard of practice for biologic medications.   I reviewed in detail that Actemra is an appropriate alternative and she does not have any contraindication. She does have history of hyperlipidemia and self-reported history of diverticulitis (not documented in chart).  I reviewed in detail that biologic injection was suggested as therapy option since she has severe GI side effects from azathioprine. Reviewed that Cellcept would generally be an option but her GI intolerance warranted an injectable.  I reviewed in detail that GI upset is less likely with injectable medications compared to oral options.  Counseled patient that Actemra is an IL-6 blocking agent.  Counseled patient on purpose, proper use, and adverse effects of Actemra.  Reviewed the most common adverse effects including infections, infusion reactions, bowel injury, and rarely cancer and conditions of the nervous system.  Reviewed risk of lipid abnormalities and elevated liver enzymes. Discussed that there is the possibility of an increased risk of malignancy but it is not well understood if this increased risk is due to the medication or the disease state. Counseled patient that Actemra should be held prior to scheduled surgery for infections or new antibiotic use. Counseled patient that Actemra should be held prior to scheduled surgery. Recommend annual influenza, PCV 15 or PCV20 or Pneumovax 23, and Shingrix as indicated.  Reviewed the importance of regular labs while on Actemra therapy including the need for routine lipid panel.  Will recheck lipid panel after 4-8 weeks of starting Actemra, then every 6 months routinely. CBC and CMP will be  monitored every 3 months. TB gold will be monitored annually. Standing orders remain in place.  Provided patient with medication education material via MyChart and answered all questions.  Patient voiced understanding.  Reviewed storage instructions of Actemra with patient.  Advised patient that initial Actemra injection must be given in the office.  Will apply for Actemra through patient's insurance for self-administration using auto-injector.  MyChart message sent with Patient assistance application. Patient has been advised that [employer]'s income threshold is significantly higher than that for Orencia patient assistance program. Patient provided verbal consent to start benefits investigation.  Patient dose will be  162 mg every 14 days based on weight <100 kg .  Prescription pending lab results and/or insurance approval.  I offered  a referral to another rheumatology clinic for second opinion on therapy options. At this time she'd like to see the patient assistance application for Actemra and pending on if she qualifies, seek a second opinion after.  Knox Saliva, PharmD, MPH, BCPS Clinical Pharmacist (Rheumatology and Pulmonology)

## 2021-06-24 NOTE — Telephone Encounter (Addendum)
Please start Actemra Actpen BIV.  Dose: 162 mg every 14 days  Dx: Rheumatoid arthritis (M05.9)  Previously tried therapies: azathioprine  Therapies patient unable to try: TNF inhibitors - significant cardiovascular history   Sent patient assistance application to patient via [place]. Provider portion placed in provider mailbox to be completed.  Knox Saliva, PharmD, MPH, BCPS Clinical Pharmacist (Rheumatology and Pulmonology)

## 2021-06-24 NOTE — Telephone Encounter (Signed)
Received notification from [employer] regarding an appeal for [facility]. Authorization has been APPROVED from 06/10/2021 to 10/17/2021.   Per test claim, copay for 28 days supply is $1,353.61  Patient can fill through [place]: [phone]   Authorization # [id number]   Will begin PAP process.

## 2021-06-25 ENCOUNTER — Encounter (INDEPENDENT_AMBULATORY_CARE_PROVIDER_SITE_OTHER): Payer: Self-pay | Admitting: Vascular Surgery

## 2021-06-25 ENCOUNTER — Ambulatory Visit (INDEPENDENT_AMBULATORY_CARE_PROVIDER_SITE_OTHER): Payer: Medicare Other | Admitting: Vascular Surgery

## 2021-06-25 ENCOUNTER — Telehealth (INDEPENDENT_AMBULATORY_CARE_PROVIDER_SITE_OTHER): Payer: Self-pay | Admitting: Vascular Surgery

## 2021-06-25 ENCOUNTER — Other Ambulatory Visit: Payer: Self-pay

## 2021-06-25 VITALS — BP 151/83 | HR 86 | Resp 16 | Wt 199.2 lb

## 2021-06-25 DIAGNOSIS — Q2730 Arteriovenous malformation, site unspecified: Secondary | ICD-10-CM

## 2021-06-25 DIAGNOSIS — I10 Essential (primary) hypertension: Secondary | ICD-10-CM

## 2021-06-25 DIAGNOSIS — E119 Type 2 diabetes mellitus without complications: Secondary | ICD-10-CM

## 2021-06-25 DIAGNOSIS — I2510 Atherosclerotic heart disease of native coronary artery without angina pectoris: Secondary | ICD-10-CM

## 2021-06-25 DIAGNOSIS — I872 Venous insufficiency (chronic) (peripheral): Secondary | ICD-10-CM

## 2021-06-25 NOTE — Telephone Encounter (Signed)
Submitted a Prior Authorization request to [facility] for ACTEMRA via CoverMyMeds. Will update once we receive a response.   Key: Norva Karvonen

## 2021-06-25 NOTE — Telephone Encounter (Signed)
patient was seen 06/25/21 and called after appt to be seen next day for unna boot. I added patient to the schedule.  Patient was advised at time of visit to wear compression socks, patient tried but could not tolerate them on her leg.   This note is for documentation purposes only.

## 2021-06-25 NOTE — Progress Notes (Signed)
MRN : [id number]  Tina Berger is a 70 y.o. (19-Dec-1950) female who presents with chief complaint of wound check.  History of Present Illness:   Patient returns the office status post removal of AV malformation left leg.  Last week the incision had separated further and it had a very characteristic appearance of a venous ulcer.  She also had significant increased swelling at that visit as well.  Therefore Unna boot therapy was started.   She denies pain or drainage at the surgery site but she notes the wound seems a little smaller.  She tolerated the Unna boot and removed it this morning.   Path is consistent with a benign AV malformation  No outpatient medications have been marked as taking for the 06/25/21 encounter (Appointment) with Delana Meyer, Dolores Lory, MD.    Past Medical History:  Diagnosis Date   Acute renal insufficiency 05/31/2014   pt not aware of this diagnosis   Anemia, iron deficiency    Anxiety    Bilateral lower extremity edema    a. uses torsemide   Cervical dysplasia    abnormal paps   Colon cancer screening 06/14/2014   Coronary artery disease    Cough 08/08/2012   Degenerative disk disease 11/19/2011   Depression    Diabetes mellitus type II    Diverticulosis    DJD (degenerative joint disease)    Drug rash 05/22/2011   Dry eyes    Dysrhythmia    metoprolol.   Edema    Elevated liver enzymes 03/21/2012   Encounter for routine gynecological examination 06/14/2014   ESOPHAGITIS 03/28/2007   Qualifier: Hospitalized for  By: Marcelino Scot CMA, [place]     Fibromyalgia    GASTRITIS 03/28/2007   Qualifier: History of  By: Marcelino Scot CMA, [place] Bilberry     GERD (gastroesophageal reflux disease)    Hemorrhoids    external   HLD (hyperlipidemia)    HNP (herniated nucleus pulposus) 11/1997   T6,7,8 with DJD   HTN (hypertension)    Hyperglycemia 05/13/2008   Qualifier: Diagnosis of  By: Glori Bickers MD, Carmell [place]    Hypothyroidism    Interstitial lung disease  ([place])    pt not aware of this   Left ovarian cyst    x 3, rupture   Osteoarthritis    hands   Osteopenia    mild-11/01; improved 12/05   Other screening mammogram 08/18/2011   Palpitations    PERSONAL HISTORY ALLERGY UNSPEC MEDICINAL AGENT 03/18/2010   Qualifier: Diagnosis of  By: Glori Bickers MD, Carmell [place]    PONV (postoperative nausea and vomiting)    Raynaud's disease    Recurrent HSV (herpes simplex virus)    lesions in nose or mouth with frequent ulcers. d/t meds and dry mouth   Rheumatoid arthritis ([place])    Rhinitis 03/21/2012   Routine general medical examination at a health care facility 03/28/2011    Past Surgical History:  Procedure Laterality Date   ABDOMINAL EXPLORATION SURGERY     ACDF  1991   BIOPSY OF SKIN SUBCUTANEOUS TISSUE AND/OR MUCOUS MEMBRANE Left 04/08/2021   Procedure: BIOPSY OF SKIN SUBCUTANEOUS TISSUE AND/OR MUCOUS MEMBRANE ( REMOVAL SKIN NODULE);  Surgeon: Katha Cabal, MD;  Location: [facility] ORS;  Service: Vascular;  Laterality: Left;   COLONOSCOPY  08/2000   Diverticulosis; hemorrhoids   HAND SURGERY Left    left thumb. PIN REMOVED   LASIK     bilateral    Social History Social History  Tobacco Use   Smoking status: Former    Packs/day: 1.00    Years: 5.00    Pack years: 5.00    Types: Cigarettes    Quit date: 10/18/1968    Years since quitting: 52.7   Smokeless tobacco: Never   Tobacco comments:    quit over 40 years  Vaping Use   Vaping Use: Never used  Substance Use Topics   Alcohol use: No    Alcohol/week: 0.0 standard drinks   Drug use: No    Family History Family History  Problem Relation Age of Onset   Emphysema Father        + smoker   Lung cancer Mother        + smoker   Coronary artery disease Mother        relatively young   Lymphoma Sister    Heart disease Brother    Lymphoma Brother    Parkinson's disease Brother    Anemia Brother        aplastic    Diabetes Brother    Leukemia Son    Iron deficiency Daughter     Breast cancer Neg Hx     Allergies  Allergen Reactions   Ceftin [Cefuroxime] Swelling    Swelling, "legs turn blue".  Legs swelling, then blue, then a rash   Clonidine Derivatives     Swelling of lips   Erythromycin Swelling    Rash, swollen gums    Keflex [Cephalexin] Anaphylaxis    Chest was broken out in a rash. Lips were swelling. Took a few days to occur, but it kept getting worse.                                                                                                                                                                                                   Penicillins Anaphylaxis    Pt had a daughter with an allergy to this at a young age. Patient developed blisters anywhere her child touched her. Also, if she used the bathroom after daughter did while on keflex, it would cause a reaction for her. Lips and roof of mouth swelled up also.   Sulfonamide Derivatives Swelling    Rash, swollen gums, lips   Ciprofloxacin     REACTION: ? rash vs sun rxn. Rash    Fluoxetine Hcl     REACTION: stomach problems. Severe pain in abdomen. Could not function d/t pain   Furosemide Swelling    REACTION: swelling worsened in legs once taking   Gabapentin Other (See Comments)    REACTION: edema of feet.  Unable to get shoes on   Paroxetine     REACTION: weight gain (30 pound weight gain   Pregabalin     REACTION: swelling of feet and legs. Unable to get shoes on.   Tetracycline     REACTION:inflammed genitals.    Venlafaxine     REACTION: sweating profusely   Etodolac     REACTION: reaction not known   Amitriptyline Hcl     REACTION: sedating   Atorvastatin     REACTION: muscle twitch and pain   Benicar [Olmesartan Medoxomil]     Muscle pain    Cephalexin Hives and Rash   Cetirizine Hcl     REACTION: headache   Diovan [Valsartan]     Thought it made her feel confused   Naproxen Sodium Other (See Comments)    REACTION: edema of feet and legs.  Not good for ckd      REVIEW OF SYSTEMS (Negative unless checked)  Constitutional: '[]'$ Weight loss  '[]'$ Fever  '[]'$ Chills Cardiac: '[]'$ Chest pain   '[]'$ Chest pressure   '[]'$ Palpitations   '[]'$ Shortness of breath when laying flat   '[]'$ Shortness of breath with exertion. Vascular:  '[]'$ Pain in legs with walking   '[]'$ Pain in legs at rest  '[]'$ History of DVT   '[]'$ Phlebitis   '[x]'$ Swelling in legs   '[]'$ Varicose veins   '[]'$ Non-healing ulcers Pulmonary:   '[]'$ Uses home oxygen   '[]'$ Productive cough   '[]'$ Hemoptysis   '[]'$ Wheeze  '[]'$ COPD   '[]'$ Asthma Neurologic:  '[]'$ Dizziness   '[]'$ Seizures   '[]'$ History of stroke   '[]'$ History of TIA  '[]'$ Aphasia   '[]'$ Vissual changes   '[]'$ Weakness or numbness in arm   '[]'$ Weakness or numbness in leg Musculoskeletal:   '[]'$ Joint swelling   '[]'$ Joint pain   '[]'$ Low back pain Hematologic:  '[]'$ Easy bruising  '[]'$ Easy bleeding   '[]'$ Hypercoagulable state   '[]'$ Anemic Gastrointestinal:  '[]'$ Diarrhea   '[]'$ Vomiting  '[]'$ Gastroesophageal reflux/heartburn   '[]'$ Difficulty swallowing. Genitourinary:  '[]'$ Chronic kidney disease   '[]'$ Difficult urination  '[]'$ Frequent urination   '[]'$ Blood in urine Skin:  '[]'$ Rashes   '[x]'$ Ulcers  Psychological:  '[]'$ History of anxiety   '[]'$  History of major depression.  Physical Examination  There were no vitals filed for this visit. There is no height or weight on file to calculate BMI. Gen: WD/WN, NAD Head: [place]/AT, No temporalis wasting.  Ear/Nose/Throat: Hearing grossly intact, nares w/o erythema or drainage, pinna without lesions Eyes: PER, EOMI, sclera nonicteric.  Neck: Supple, no gross masses.  No JVD.  Pulmonary:  Good air movement, no audible wheezing, no use of accessory muscles.  Cardiac: RRR, precordium not hyperdynamic. Vascular:  scattered varicosities present bilaterally.  Moderate venous stasis changes to the legs bilaterally.  Trace  soft pitting edema. Ulcer  left ankle appears smaller and now has granulation Vessel Right Left  Radial Palpable Palpable  Gastrointestinal: soft, non-distended. No guarding/no peritoneal  signs.  Musculoskeletal: M/S 5/5 throughout.  No deformity.  Neurologic: CN 2-12 intact. Pain and light touch intact in extremities.  Symmetrical.  Speech is fluent. Motor exam as listed above. Psychiatric: Judgment intact, Mood & affect appropriate for pt's clinical situation. Dermatologic: Venous rashes no ulcers noted.  No changes consistent with cellulitis. Lymph : No lichenification or skin changes of chronic lymphedema.  CBC Lab Results  Component Value Date   WBC 8.9 04/07/2021   HGB 11.1 (L) 04/07/2021   HCT 34.5 (L) 04/07/2021   MCV 92.2 04/07/2021   PLT 346 04/07/2021    BMET    Component Value Date/Time  NA 137 04/07/2021 1015   NA 141 02/06/2021 1109   NA 135 (L) 03/06/2012 0546   K 4.5 04/07/2021 1015   K 4.9 03/06/2012 0546   CL 101 04/07/2021 1015   CL 99 03/06/2012 0546   CO2 30 04/07/2021 1015   CO2 28 03/06/2012 0546   GLUCOSE 135 (H) 04/07/2021 1015   GLUCOSE 138 (H) 03/06/2012 0546   BUN 34 (H) 04/07/2021 1015   BUN 17 02/06/2021 1109   BUN 21 (H) 03/06/2012 0546   CREATININE 0.91 04/07/2021 1015   CREATININE 0.90 03/24/2021 0930   CALCIUM 8.7 (L) 04/07/2021 1015   CALCIUM 8.4 (L) 03/06/2012 0546   GFRNONAA >60 04/07/2021 1015   GFRNONAA 64 03/24/2021 0930   GFRAA 75 03/24/2021 0930   CrCl cannot be calculated (Patient's most recent lab result is older than the maximum 21 days allowed.).  COAG No results found for: INR, PROTIME  Radiology No results found.   Assessment/Plan 1. AVM (arteriovenous malformation) Patient will begin Aquacel dressing with compression She will follow up in 2 weeks  2. Chronic venous insufficiency Patient will begin Aquacel dressing with compression She will follow up in 2 weeks  3. Coronary artery disease involving native coronary artery of native heart without angina pectoris Continue cardiac and antihypertensive medications as already ordered and reviewed, no changes at this time.  Continue statin as  ordered and reviewed, no changes at this time  Nitrates PRN for chest pain   4. Primary hypertension Continue antihypertensive medications as already ordered, these medications have been reviewed and there are no changes at this time.   5. Type 2 diabetes mellitus without complication, without long-term current use of insulin (HCC) Continue hypoglycemic medications as already ordered, these medications have been reviewed and there are no changes at this time.  Hgb A1C to be monitored as already arranged by primary service     Hortencia Pilar, MD  06/25/2021 12:58 PM

## 2021-06-25 NOTE — Telephone Encounter (Signed)
Provider portion of [employer] patient assistance signed. Will file this with med list and insurance card copy in pending PAP folder.  Awaiting patient portion and prior auth approval  Knox Saliva, PharmD, MPH, BCPS Clinical Pharmacist (Rheumatology and Pulmonology)

## 2021-06-26 ENCOUNTER — Encounter (INDEPENDENT_AMBULATORY_CARE_PROVIDER_SITE_OTHER): Payer: Self-pay | Admitting: Nurse Practitioner

## 2021-06-26 ENCOUNTER — Ambulatory Visit (INDEPENDENT_AMBULATORY_CARE_PROVIDER_SITE_OTHER): Payer: Medicare Other | Admitting: Nurse Practitioner

## 2021-06-26 ENCOUNTER — Other Ambulatory Visit (HOSPITAL_COMMUNITY): Payer: Self-pay

## 2021-06-26 VITALS — BP 124/75 | HR 82 | Resp 16

## 2021-06-26 DIAGNOSIS — Q2730 Arteriovenous malformation, site unspecified: Secondary | ICD-10-CM | POA: Diagnosis not present

## 2021-06-26 NOTE — Telephone Encounter (Addendum)
Received notification from [facility] regarding a prior authorization for [place]. Authorization has been APPROVED from 06/26/2021 to 10/17/2021. Approval letter printed and placed with existing paperwork and moved to Awaiting Response folder.   Per test claim, approval has not adjudicated at pharmacy level.  Patient can possibly fill through [place]: [phone]  unless a future test claim proves otherwise.  Authorization # [id number] Phone # [phone]

## 2021-06-26 NOTE — Progress Notes (Signed)
History of Present Illness  There is no documented history at this time  Assessments & Plan   There are no diagnoses linked to this encounter.    Additional instructions  Subjective:  Patient presents with venous ulcer of the Left lower extremity.    Procedure:  3 layer unna wrap was placed Left lower extremity.   Plan:   Follow up in one week.  

## 2021-06-27 ENCOUNTER — Encounter (INDEPENDENT_AMBULATORY_CARE_PROVIDER_SITE_OTHER): Payer: Self-pay | Admitting: Nurse Practitioner

## 2021-06-28 ENCOUNTER — Encounter (INDEPENDENT_AMBULATORY_CARE_PROVIDER_SITE_OTHER): Payer: Self-pay | Admitting: Vascular Surgery

## 2021-06-29 ENCOUNTER — Other Ambulatory Visit (HOSPITAL_COMMUNITY): Payer: Self-pay

## 2021-06-29 NOTE — Telephone Encounter (Signed)
Received patient portion of [facility] patient assistance application for Actemra   Per test claim, copay for 28 day supply is $574.85  Knox Saliva, PharmD, MPH, BCPS Clinical Pharmacist (Rheumatology and Pulmonology)

## 2021-06-29 NOTE — Telephone Encounter (Signed)
Submitted Patient Assistance Application to [place] for [place] along with provider portion, PA, signde patient portion.Will update patient when we receive a response.  Fax# [phone] Phone# [phone]  Knox Saliva, PharmD, MPH, BCPS Clinical Pharmacist (Rheumatology and Pulmonology)

## 2021-06-30 ENCOUNTER — Other Ambulatory Visit: Payer: Self-pay | Admitting: Family Medicine

## 2021-06-30 MED ORDER — PREDNISONE 10 MG PO TABS: 10.0000 mg | ORAL_TABLET | Freq: Every day | ORAL | 1 refills | Status: DC

## 2021-06-30 NOTE — Telephone Encounter (Signed)
Please call and prednisone 10 mg p.o. daily total  30 tablets rx1.  She may stay on prednisone 10 mg p.o. daily until we can start her on immunosuppressive therapy.Marland Kitchen  She will need close monitoring of her blood sugar.

## 2021-07-01 ENCOUNTER — Other Ambulatory Visit: Payer: Self-pay | Admitting: Family Medicine

## 2021-07-01 NOTE — Telephone Encounter (Signed)
Last OV was on 11/13/20 for joint pain, last filled on 09/08/20 #180 tabs with 1 refill

## 2021-07-03 ENCOUNTER — Encounter (INDEPENDENT_AMBULATORY_CARE_PROVIDER_SITE_OTHER): Payer: Medicare Other

## 2021-07-06 ENCOUNTER — Other Ambulatory Visit: Payer: Self-pay

## 2021-07-06 ENCOUNTER — Ambulatory Visit (INDEPENDENT_AMBULATORY_CARE_PROVIDER_SITE_OTHER): Payer: Medicare Other | Admitting: Nurse Practitioner

## 2021-07-06 VITALS — BP 145/84 | HR 82 | Ht 62.0 in | Wt 197.0 lb

## 2021-07-06 DIAGNOSIS — M7989 Other specified soft tissue disorders: Secondary | ICD-10-CM | POA: Diagnosis not present

## 2021-07-06 NOTE — Progress Notes (Signed)
History of Present Illness  There is no documented history at this time  Assessments & Plan   There are no diagnoses linked to this encounter.    Additional instructions  Subjective:  Patient presents with venous ulcer of the Left lower extremity.    Procedure:  3 layer unna wrap was placed Left lower extremity.   Plan:   Follow up in one week.  

## 2021-07-07 ENCOUNTER — Encounter (INDEPENDENT_AMBULATORY_CARE_PROVIDER_SITE_OTHER): Payer: Self-pay | Admitting: Nurse Practitioner

## 2021-07-07 NOTE — Telephone Encounter (Signed)
[place] for update on application. Per rep, they need provider form refaxed since it was cut off on the bottom.Will refax form to [place] confirmed that company had incorrect fax number on file  Fax# [phone] Phone# [phone]  Knox Saliva, PharmD, MPH, BCPS Clinical Pharmacist (Rheumatology and Pulmonology)

## 2021-07-09 ENCOUNTER — Ambulatory Visit (INDEPENDENT_AMBULATORY_CARE_PROVIDER_SITE_OTHER): Payer: Medicare Other | Admitting: Vascular Surgery

## 2021-07-10 NOTE — Telephone Encounter (Signed)
[place] for update on Actemra PAP application.  Received verbal confirmation from  [place] regarding an approval for [place] patient assistance from 07/07/21 until she does not need medication anymore or eligibility changes.  Awaiting faxed confirmation approval letter  Phone: [phone], option 6  Knox Saliva, PharmD, MPH, BCPS Clinical Pharmacist (Rheumatology and Pulmonology)

## 2021-07-13 ENCOUNTER — Ambulatory Visit (INDEPENDENT_AMBULATORY_CARE_PROVIDER_SITE_OTHER): Payer: Medicare Other | Admitting: Nurse Practitioner

## 2021-07-13 ENCOUNTER — Encounter (INDEPENDENT_AMBULATORY_CARE_PROVIDER_SITE_OTHER): Payer: Self-pay

## 2021-07-13 ENCOUNTER — Other Ambulatory Visit: Payer: Self-pay

## 2021-07-13 VITALS — BP 143/83 | HR 81 | Resp 16 | Wt 201.0 lb

## 2021-07-13 DIAGNOSIS — M7989 Other specified soft tissue disorders: Secondary | ICD-10-CM

## 2021-07-13 NOTE — Progress Notes (Signed)
History of Present Illness  There is no documented history at this time  Assessments & Plan   There are no diagnoses linked to this encounter.    Additional instructions  Subjective:  Patient presents with venous ulcer of the Left lower extremity.    Procedure:  3 layer unna wrap was placed Left lower extremity.   Plan:   Follow up in one week.  

## 2021-07-17 ENCOUNTER — Encounter (INDEPENDENT_AMBULATORY_CARE_PROVIDER_SITE_OTHER): Payer: Self-pay | Admitting: Nurse Practitioner

## 2021-07-20 ENCOUNTER — Encounter (INDEPENDENT_AMBULATORY_CARE_PROVIDER_SITE_OTHER): Payer: Self-pay | Admitting: Vascular Surgery

## 2021-07-20 ENCOUNTER — Other Ambulatory Visit: Payer: Self-pay

## 2021-07-20 ENCOUNTER — Ambulatory Visit (INDEPENDENT_AMBULATORY_CARE_PROVIDER_SITE_OTHER): Payer: Medicare Other | Admitting: Vascular Surgery

## 2021-07-20 VITALS — BP 147/86 | HR 92 | Resp 16 | Wt 202.0 lb

## 2021-07-20 DIAGNOSIS — I872 Venous insufficiency (chronic) (peripheral): Secondary | ICD-10-CM

## 2021-07-20 DIAGNOSIS — E119 Type 2 diabetes mellitus without complications: Secondary | ICD-10-CM

## 2021-07-20 DIAGNOSIS — I10 Essential (primary) hypertension: Secondary | ICD-10-CM

## 2021-07-20 DIAGNOSIS — Q2730 Arteriovenous malformation, site unspecified: Secondary | ICD-10-CM | POA: Diagnosis not present

## 2021-07-20 DIAGNOSIS — K219 Gastro-esophageal reflux disease without esophagitis: Secondary | ICD-10-CM

## 2021-07-20 NOTE — Progress Notes (Signed)
MRN : [id number]  Tina Berger is a 70 y.o. (26-Nov-1950) female who presents with chief complaint of check leg.  History of Present Illness:   Patient returns the office status post removal of AV malformation left leg. She tried using wound care with a compression but was not able to put the stockings Therefore Unna boot therapy was restarted.   She denies pain or drainage at the surgery site but she notes the wound seems a little smaller.  She tolerated the Unna boot and removed it this morning.   Path is consistent with a benign AV malformation   Current Meds  Medication Sig   Acetaminophen (TYLENOL ARTHRITIS PAIN PO) Take 650 mg by mouth daily.   buPROPion (WELLBUTRIN XL) 150 MG 24 hr tablet Take 1 tablet (150 mg total) by mouth daily.   Chlorphen-Phenyleph-ASA (ALKA-SELTZER PLUS COLD PO) Take by mouth as needed.   cholecalciferol (VITAMIN D3) 25 MCG (1000 UNIT) tablet Take 1,000 Units by mouth daily.   cyclobenzaprine (FLEXERIL) 10 MG tablet TAKE 1/2 TO 1 TABLET BY  MOUTH ONCE DAILY AND 1  TABLET AT BEDTIME   Diclofenac Sodium 1.5 % SOLN Place 2 mLs onto the skin 4 (four) times daily.   DULoxetine (CYMBALTA) 60 MG capsule Take 1 capsule (60 mg total) by mouth daily.   esomeprazole (NEXIUM) 20 MG capsule Take 1 capsule (20 mg total) by mouth daily.   famotidine (PEPCID) 40 MG tablet Take 1 tablet (40 mg total) by mouth daily.   ferrous sulfate 325 (65 FE) MG EC tablet Take 325 mg by mouth daily with breakfast.   glucose blood test strip One Touch Ultra stripts blue-To check sugar once daily and as needed for DM2 250.00   GUAIFENESIN CR PO Take by mouth as needed.   HYDROmorphone HCl (EXALGO) 12 MG T24A SR tablet Take 12 mg by mouth 2 (two) times daily.   hyoscyamine (LEVSIN SL) 0.125 MG SL tablet TAKE 1 TABLET (0.125 MG TOTAL) BY MOUTH EVERY 4 (FOUR) HOURS AS NEEDED FOR CRAMPING.   ketoconazole (NIZORAL) 2 % shampoo SHAMPOO WITH A SMALL AMOUNT AS DIRECTED THREE TIMES A WEEK  SHAMPOO SCALP 3 DAYS A WEEK, LET SHAMPOO SIT FOR 10 MINUTES AND RINSE OFF   levothyroxine (SYNTHROID) 150 MCG tablet Take 1 tablet (150 mcg total) by mouth daily.   LYSINE PO Take by mouth daily.   Magnesium 400 MG CAPS Take by mouth daily.   metFORMIN (GLUCOPHAGE) 500 MG tablet TAKE 1 TABLET BY MOUTH  TWICE DAILY WITH MEALS   metoprolol succinate (TOPROL-XL) 50 MG 24 hr tablet TAKE 1 TABLET BY MOUTH  TWICE DAILY TAKE WITH OR  IMMEDIATELY FOLLOWING A  MEAL   ondansetron (ZOFRAN) 4 MG tablet Take 1 tablet (4 mg total) by mouth every 8 (eight) hours as needed for nausea or vomiting.   oxyCODONE (OXYCONTIN) 10 MG 12 hr tablet Take 10 mg by mouth 3 (three) times daily as needed. Beakthrough pain   predniSONE (DELTASONE) 10 MG tablet Take 1 tablet (10 mg total) by mouth daily with breakfast.   predniSONE (DELTASONE) 5 MG tablet Take 2 tabs po x 4 days, 1.5 tabs po x 4 days, 1 tab po x 4 days, 0.5 tab po x 4 days   ramipril (ALTACE) 5 MG capsule Take 5 mg by mouth daily.   rosuvastatin (CRESTOR) 5 MG tablet TAKE 1 TABLET BY MOUTH  TWICE WEEKLY   torsemide (DEMADEX) 20 MG tablet TAKE 1 TABLET  BY MOUTH  DAILY AS NEEDED    Past Medical History:  Diagnosis Date   Acute renal insufficiency 05/31/2014   pt not aware of this diagnosis   Anemia, iron deficiency    Anxiety    Bilateral lower extremity edema    a. uses torsemide   Cervical dysplasia    abnormal paps   Colon cancer screening 06/14/2014   Coronary artery disease    Cough 08/08/2012   Degenerative disk disease 11/19/2011   Depression    Diabetes mellitus type II    Diverticulosis    DJD (degenerative joint disease)    Drug rash 05/22/2011   Dry eyes    Dysrhythmia    metoprolol.   Edema    Elevated liver enzymes 03/21/2012   Encounter for routine gynecological examination 06/14/2014   ESOPHAGITIS 03/28/2007   Qualifier: Hospitalized for  By: Marcelino Scot CMA, [place] Bilberry     Fibromyalgia    GASTRITIS 03/28/2007   Qualifier:  History of  By: Marcelino Scot CMA, [place] Bilberry     GERD (gastroesophageal reflux disease)    Hemorrhoids    external   HLD (hyperlipidemia)    HNP (herniated nucleus pulposus) 11/1997   T6,7,8 with DJD   HTN (hypertension)    Hyperglycemia 05/13/2008   Qualifier: Diagnosis of  By: Glori Bickers MD, Carmell Austria    Hypothyroidism    Interstitial lung disease ([place])    pt not aware of this   Left ovarian cyst    x 3, rupture   Osteoarthritis    hands   Osteopenia    mild-11/01; improved 12/05   Other screening mammogram 08/18/2011   Palpitations    PERSONAL HISTORY ALLERGY UNSPEC MEDICINAL AGENT 03/18/2010   Qualifier: Diagnosis of  By: Glori Bickers MD, Carmell Austria    PONV (postoperative nausea and vomiting)    Raynaud's disease    Recurrent HSV (herpes simplex virus)    lesions in nose or mouth with frequent ulcers. d/t meds and dry mouth   Rheumatoid arthritis ([place])    Rhinitis 03/21/2012   Routine general medical examination at a health care facility 03/28/2011    Past Surgical History:  Procedure Laterality Date   ABDOMINAL EXPLORATION SURGERY     ACDF  1991   BIOPSY OF SKIN SUBCUTANEOUS TISSUE AND/OR MUCOUS MEMBRANE Left 04/08/2021   Procedure: BIOPSY OF SKIN SUBCUTANEOUS TISSUE AND/OR MUCOUS MEMBRANE ( REMOVAL SKIN NODULE);  Surgeon: Katha Cabal, MD;  Location: [facility] ORS;  Service: Vascular;  Laterality: Left;   COLONOSCOPY  08/2000   Diverticulosis; hemorrhoids   HAND SURGERY Left    left thumb. PIN REMOVED   LASIK     bilateral    Social History Social History   Tobacco Use   Smoking status: Former    Packs/day: 1.00    Years: 5.00    Pack years: 5.00    Types: Cigarettes    Quit date: 10/18/1968    Years since quitting: 52.7   Smokeless tobacco: Never   Tobacco comments:    quit over 40 years  Vaping Use   Vaping Use: Never used  Substance Use Topics   Alcohol use: No    Alcohol/week: 0.0 standard drinks   Drug use: No    Family History Family History  Problem  Relation Age of Onset   Emphysema Father        + smoker   Lung cancer Mother        + smoker   Coronary artery disease  Mother        relatively young   Lymphoma Sister    Heart disease Brother    Lymphoma Brother    Parkinson's disease Brother    Anemia Brother        aplastic    Diabetes Brother    Leukemia Son    Iron deficiency Daughter    Breast cancer Neg Hx     Allergies  Allergen Reactions   Ceftin [Cefuroxime] Swelling    Swelling, "legs turn blue".  Legs swelling, then blue, then a rash   Clonidine Derivatives     Swelling of lips   Erythromycin Swelling    Rash, swollen gums    Keflex [Cephalexin] Anaphylaxis    Chest was broken out in a rash. Lips were swelling. Took a few days to occur, but it kept getting worse.                                                                                                                                                                                                   Penicillins Anaphylaxis    Pt had a daughter with an allergy to this at a young age. Patient developed blisters anywhere her child touched her. Also, if she used the bathroom after daughter did while on keflex, it would cause a reaction for her. Lips and roof of mouth swelled up also.   Sulfonamide Derivatives Swelling    Rash, swollen gums, lips   Ciprofloxacin     REACTION: ? rash vs sun rxn. Rash    Fluoxetine Hcl     REACTION: stomach problems. Severe pain in abdomen. Could not function d/t pain   Furosemide Swelling    REACTION: swelling worsened in legs once taking   Gabapentin Other (See Comments)    REACTION: edema of feet. Unable to get shoes on   Paroxetine     REACTION: weight gain (30 pound weight gain   Pregabalin     REACTION: swelling of feet and legs. Unable to get shoes on.   Tetracycline     REACTION:inflammed genitals.    Venlafaxine     REACTION: sweating profusely   Etodolac     REACTION: reaction not known   Amitriptyline Hcl      REACTION: sedating   Atorvastatin     REACTION: muscle twitch and pain   Benicar [Olmesartan Medoxomil]     Muscle pain    Cephalexin Hives and Rash   Cetirizine Hcl     REACTION: headache   Diovan [Valsartan]     Thought it made her feel confused  Naproxen Sodium Other (See Comments)    REACTION: edema of feet and legs.  Not good for ckd     REVIEW OF SYSTEMS (Negative unless checked)  Constitutional: [] Weight loss  [] Fever  [] Chills Cardiac: [] Chest pain   [] Chest pressure   [] Palpitations   [] Shortness of breath when laying flat   [] Shortness of breath with exertion. Vascular:  [] Pain in legs with walking   [] Pain in legs at rest  [] History of DVT   [] Phlebitis   [x] Swelling in legs   [] Varicose veins   [] Non-healing ulcers Pulmonary:   [] Uses home oxygen   [] Productive cough   [] Hemoptysis   [] Wheeze  [] COPD   [] Asthma Neurologic:  [] Dizziness   [] Seizures   [] History of stroke   [] History of TIA  [] Aphasia   [] Vissual changes   [] Weakness or numbness in arm   [] Weakness or numbness in leg Musculoskeletal:   [] Joint swelling   [] Joint pain   [] Low back pain Hematologic:  [] Easy bruising  [] Easy bleeding   [] Hypercoagulable state   [] Anemic Gastrointestinal:  [] Diarrhea   [] Vomiting  [x] Gastroesophageal reflux/heartburn   [] Difficulty swallowing. Genitourinary:  [] Chronic kidney disease   [] Difficult urination  [] Frequent urination   [] Blood in urine Skin:  [] Rashes   [x] Ulcers  Psychological:  [] History of anxiety   []  History of major depression.  Physical Examination  Vitals:   07/20/21 1313  BP: (!) 147/86  Pulse: 92  Resp: 16  Weight: 202 lb (91.6 kg)   Body mass index is 36.95 kg/m. Gen: WD/WN, NAD Head: [place]/AT, No temporalis wasting.  Ear/Nose/Throat: Hearing grossly intact, nares w/o erythema or drainage, pinna without lesions Eyes: PER, EOMI, sclera nonicteric.  Neck: Supple, no gross masses.  No JVD.  Pulmonary:  Good air movement, no audible wheezing, no use  of accessory muscles.  Cardiac: RRR, precordium not hyperdynamic. Vascular:  scattered varicosities present bilaterally.  Ulcer left ankle is smaller 3-4 mm and well granulated noninfected.  Moderate venous stasis changes to the legs bilaterally.  2+ soft pitting edema  Vessel Right Left  Radial Palpable Palpable  Gastrointestinal: soft, non-distended. No guarding/no peritoneal signs.  Musculoskeletal: M/S 5/5 throughout.  No deformity.  Neurologic: CN 2-12 intact. Pain and light touch intact in extremities.  Symmetrical.  Speech is fluent. Motor exam as listed above. Psychiatric: Judgment intact, Mood & affect appropriate for pt's clinical situation. Dermatologic: Venous rashes no ulcers noted.  No changes consistent with cellulitis. Lymph : No lichenification or skin changes of chronic lymphedema.  CBC Lab Results  Component Value Date   WBC 8.9 04/07/2021   HGB 11.1 (L) 04/07/2021   HCT 34.5 (L) 04/07/2021   MCV 92.2 04/07/2021   PLT 346 04/07/2021    BMET    Component Value Date/Time   NA 137 04/07/2021 1015   NA 141 02/06/2021 1109   NA 135 (L) 03/06/2012 0546   K 4.5 04/07/2021 1015   K 4.9 03/06/2012 0546   CL 101 04/07/2021 1015   CL 99 03/06/2012 0546   CO2 30 04/07/2021 1015   CO2 28 03/06/2012 0546   GLUCOSE 135 (H) 04/07/2021 1015   GLUCOSE 138 (H) 03/06/2012 0546   BUN 34 (H) 04/07/2021 1015   BUN 17 02/06/2021 1109   BUN 21 (H) 03/06/2012 0546   CREATININE 0.91 04/07/2021 1015   CREATININE 0.90 03/24/2021 0930   CALCIUM 8.7 (L) 04/07/2021 1015   CALCIUM 8.4 (L) 03/06/2012 0546   GFRNONAA >60 04/07/2021 1015   GFRNONAA  64 03/24/2021 0930   GFRAA 75 03/24/2021 0930   CrCl cannot be calculated (Patient's most recent lab result is older than the maximum 21 days allowed.).  COAG No results found for: INR, PROTIME  Radiology No results found.   Assessment/Plan 1. AVM (arteriovenous malformation) We will continue Unna boot therapy for now  2. Chronic  venous insufficiency See #1  3. Primary hypertension Continue antihypertensive medications as already ordered, these medications have been reviewed and there are no changes at this time.   4. Gastroesophageal reflux disease, unspecified whether esophagitis present Continue PPI as already ordered, this medication has been reviewed and there are no changes at this time.  Avoidence of caffeine and alcohol  Moderate elevation of the head of the bed    5. Type 2 diabetes mellitus without complication, without long-term current use of insulin (HCC) Continue hypoglycemic medications as already ordered, these medications have been reviewed and there are no changes at this time.  Hgb A1C to be monitored as already arranged by primary service    Hortencia Pilar, MD  07/20/2021 3:03 PM

## 2021-07-23 DIAGNOSIS — K5903 Drug induced constipation: Secondary | ICD-10-CM | POA: Diagnosis not present

## 2021-07-23 DIAGNOSIS — Z79891 Long term (current) use of opiate analgesic: Secondary | ICD-10-CM | POA: Diagnosis not present

## 2021-07-23 DIAGNOSIS — M5134 Other intervertebral disc degeneration, thoracic region: Secondary | ICD-10-CM | POA: Diagnosis not present

## 2021-07-23 DIAGNOSIS — Z79899 Other long term (current) drug therapy: Secondary | ICD-10-CM | POA: Diagnosis not present

## 2021-07-23 DIAGNOSIS — M5137 Other intervertebral disc degeneration, lumbosacral region: Secondary | ICD-10-CM | POA: Diagnosis not present

## 2021-07-23 DIAGNOSIS — M4725 Other spondylosis with radiculopathy, thoracolumbar region: Secondary | ICD-10-CM | POA: Diagnosis not present

## 2021-07-23 DIAGNOSIS — M961 Postlaminectomy syndrome, not elsewhere classified: Secondary | ICD-10-CM | POA: Diagnosis not present

## 2021-07-23 DIAGNOSIS — G894 Chronic pain syndrome: Secondary | ICD-10-CM | POA: Diagnosis not present

## 2021-07-27 ENCOUNTER — Other Ambulatory Visit: Payer: Self-pay

## 2021-07-27 ENCOUNTER — Encounter (INDEPENDENT_AMBULATORY_CARE_PROVIDER_SITE_OTHER): Payer: Self-pay

## 2021-07-27 ENCOUNTER — Ambulatory Visit (INDEPENDENT_AMBULATORY_CARE_PROVIDER_SITE_OTHER): Payer: Medicare Other | Admitting: Nurse Practitioner

## 2021-07-27 VITALS — BP 154/83 | HR 84 | Resp 16 | Wt 204.0 lb

## 2021-07-27 DIAGNOSIS — Q2730 Arteriovenous malformation, site unspecified: Secondary | ICD-10-CM

## 2021-07-27 NOTE — Progress Notes (Signed)
History of Present Illness  There is no documented history at this time  Assessments & Plan   There are no diagnoses linked to this encounter.    Additional instructions  Subjective:  Patient presents with venous ulcer of the Left lower extremity.    Procedure:  3 layer unna wrap was placed Left lower extremity.   Plan:   Follow up in one week.  

## 2021-07-28 ENCOUNTER — Encounter (INDEPENDENT_AMBULATORY_CARE_PROVIDER_SITE_OTHER): Payer: Self-pay | Admitting: Nurse Practitioner

## 2021-08-03 ENCOUNTER — Ambulatory Visit (INDEPENDENT_AMBULATORY_CARE_PROVIDER_SITE_OTHER): Payer: Medicare Other | Admitting: Nurse Practitioner

## 2021-08-03 ENCOUNTER — Other Ambulatory Visit: Payer: Self-pay

## 2021-08-03 ENCOUNTER — Encounter (INDEPENDENT_AMBULATORY_CARE_PROVIDER_SITE_OTHER): Payer: Self-pay

## 2021-08-03 VITALS — BP 132/84 | HR 80 | Resp 16

## 2021-08-03 DIAGNOSIS — Q2730 Arteriovenous malformation, site unspecified: Secondary | ICD-10-CM | POA: Diagnosis not present

## 2021-08-03 NOTE — Progress Notes (Signed)
History of Present Illness  There is no documented history at this time  Assessments & Plan   There are no diagnoses linked to this encounter.    Additional instructions  Subjective:  Patient presents with venous ulcer of the Left lower extremity.    Procedure:  3 layer unna wrap was placed Left lower extremity.   Plan:   Follow up in one week.  

## 2021-08-06 ENCOUNTER — Encounter (INDEPENDENT_AMBULATORY_CARE_PROVIDER_SITE_OTHER): Payer: Self-pay | Admitting: Nurse Practitioner

## 2021-08-10 ENCOUNTER — Ambulatory Visit (INDEPENDENT_AMBULATORY_CARE_PROVIDER_SITE_OTHER): Payer: Medicare Other | Admitting: Nurse Practitioner

## 2021-08-10 ENCOUNTER — Encounter (INDEPENDENT_AMBULATORY_CARE_PROVIDER_SITE_OTHER): Payer: Self-pay

## 2021-08-10 ENCOUNTER — Other Ambulatory Visit: Payer: Self-pay

## 2021-08-10 VITALS — BP 152/88 | HR 73 | Resp 16

## 2021-08-10 DIAGNOSIS — Q2730 Arteriovenous malformation, site unspecified: Secondary | ICD-10-CM

## 2021-08-10 NOTE — Progress Notes (Signed)
Patient will be coming out of unna wrap today and will follow in 6 weeks with Dr Delana Meyer or Eulogio Ditch NP.

## 2021-08-12 ENCOUNTER — Encounter (INDEPENDENT_AMBULATORY_CARE_PROVIDER_SITE_OTHER): Payer: Self-pay

## 2021-08-12 ENCOUNTER — Encounter (INDEPENDENT_AMBULATORY_CARE_PROVIDER_SITE_OTHER): Payer: Self-pay | Admitting: Nurse Practitioner

## 2021-08-17 ENCOUNTER — Encounter (INDEPENDENT_AMBULATORY_CARE_PROVIDER_SITE_OTHER): Payer: Medicare Other | Admitting: Vascular Surgery

## 2021-08-17 ENCOUNTER — Encounter (INDEPENDENT_AMBULATORY_CARE_PROVIDER_SITE_OTHER): Payer: Self-pay

## 2021-08-17 NOTE — Progress Notes (Deleted)
Office Visit Note  Patient: Tina Berger             Date of Birth: [birth year]           MRN: [id number]             PCP: Abner Greenspan, MD Referring: Tower, [name], MD Visit Date: 08/31/2021 Occupation: @GUAROCC @  Subjective:  No chief complaint on file.   History of Present Illness: Tina Berger is a 70 y.o. female ***   Activities of Daily Living:  Patient reports morning stiffness for *** {minute/hour:19697}.   Patient {ACTIONS;DENIES/REPORTS:21021675::"Denies"} nocturnal pain.  Difficulty dressing/grooming: {ACTIONS;DENIES/REPORTS:21021675::"Denies"} Difficulty climbing stairs: {ACTIONS;DENIES/REPORTS:21021675::"Denies"} Difficulty getting out of chair: {ACTIONS;DENIES/REPORTS:21021675::"Denies"} Difficulty using hands for taps, buttons, cutlery, and/or writing: {ACTIONS;DENIES/REPORTS:21021675::"Denies"}  No Rheumatology ROS completed.   PMFS History:  Patient Active Problem List   Diagnosis Date Noted  . Rheumatoid arthritis ([place]) 05/27/2021  . Chronic venous insufficiency 05/23/2021  . AVM (arteriovenous malformation) 04/04/2021  . CAD (coronary artery disease) 01/22/2021  . Primary osteoarthritis of both knees 01/12/2021  . Positive ANA (antinuclear antibody) 11/17/2020  . Joint pain 11/13/2020  . Elevated serum creatinine 08/26/2020  . Current use of proton pump inhibitor 08/26/2020  . Estrogen deficiency 08/21/2018  . TMJ (dislocation of temporomandibular joint), initial encounter 05/05/2018  . Anterior neck pain 05/05/2018  . Chronic pain syndrome 11/09/2017  . Chronic upper extremity pain [facility] Area of Pain) (Bilateral) (L>R) 11/09/2017  . Fibromyalgia syndrome 11/09/2017  . Osteoarthritis 11/09/2017  . Osteoarthritis of lumbar facet joint (Bilateral) 11/09/2017  . Lumbar facet arthropathy (Bilateral) 11/09/2017  . Lumbar facet syndrome (Bilateral) (L>R) 11/09/2017  . Lumbar foraminal stenosis (multilevel) (Bilateral) 11/09/2017  . DDD  (degenerative disc disease), lumbar 11/09/2017  . Thoracic levoscoliosis 11/09/2017  . Thoracic facet syndrome (Bilateral) (L>R) 11/09/2017  . Thoracic facet arthropathy (Bilateral) (R>L) 11/09/2017  . DDD (degenerative disc disease), thoracic 11/09/2017  . Thoracolumbar IVDD 11/09/2017  . DDD (degenerative disc disease), cervical 11/09/2017  . Osteoarthritis of cervical facet (Bilateral) 11/09/2017  . Grade 1 Anterolisthesis of C7 over T1 11/09/2017  . Cervical foraminal stenosis (Bilateral) 11/09/2017  . History of fusion of cervical spine (C5-6 ACDF) 11/09/2017  . Cervical facet syndrome (Bilateral) 11/09/2017  . Disorder of skeletal system 11/09/2017  . Cervical radiculitis (Bilateral) 11/09/2017  . Lumbar Epidural lipomatosis 11/09/2017  . Chronic sacroiliac joint pain (Bilateral) (L>R) 11/09/2017  . Chronic hip pain (Bilateral) (L>R) 11/09/2017  . Chronic upper back pain (Primary Area of Pain) (midline) 10/24/2017  . Chronic neck pain (Secondary Area of Pain) (Bilateral)  (L>R) 10/24/2017  . Chronic low back pain (Fourth Area of Pain) (Bilateral) (L>R) 10/24/2017  . Chronic lower extremity pain (Fifth Area of Pain) (Bilateral) (L>R) 10/24/2017  . Lumbar Grade 1 Retrolisthesis of L1-2 and L2-3 10/24/2017  . Other long term (current) drug therapy 10/24/2017  . Other specified health status 10/24/2017  . Long term current use of opiate analgesic 10/24/2017  . Long term prescription opiate use 10/24/2017  . Opiate use 10/24/2017  . DM type 2 (diabetes mellitus, type 2) ([place]) 06/14/2014  . Pharmacologic therapy 06/14/2014  . Pedal edema   . Post-menopausal 06/23/2012  . Lumbar disc disease with radiculopathy 11/18/2011  . HTN (hypertension) 07/30/2011  . Raynaud disease 07/30/2011  . Obesity 07/30/2011  . Routine general medical examination at a health care facility 03/28/2011  . Palpitations 04/27/2010  . Problems influencing health status 12/11/2009  . Depression with anxiety  06/14/2008  .  Vitamin D deficiency 05/13/2008  . ANXIETY 03/25/2008  . Osteopenia 03/25/2008  . Hypothyroidism 03/28/2007  . Hyperlipidemia associated with type 2 diabetes mellitus ([place]) 03/28/2007  . Other iron deficiency anemias 03/28/2007  . PANIC ATTACK 03/28/2007  . KERATOCONJUNCTIVITIS SICCA 03/28/2007  . Mitral valve disorder 03/28/2007  . ABNORMAL HEART RHYTHMS 03/28/2007  . Raynaud's syndrome 03/28/2007  . GERD 03/28/2007  . IBS 03/28/2007  . Rosacea 03/28/2007  . PLANTAR FASCIITIS 03/28/2007  . MIGRAINES, HX OF 03/28/2007    Past Medical History:  Diagnosis Date  . Acute renal insufficiency 05/31/2014   pt not aware of this diagnosis  . Anemia, iron deficiency   . Anxiety   . Bilateral lower extremity edema    a. uses torsemide  . Cervical dysplasia    abnormal paps  . Colon cancer screening 06/14/2014  . Coronary artery disease   . Cough 08/08/2012  . Degenerative disk disease 11/19/2011  . Depression   . Diabetes mellitus type II   . Diverticulosis   . DJD (degenerative joint disease)   . Drug rash 05/22/2011  . Dry eyes   . Dysrhythmia    metoprolol.  . Edema   . Elevated liver enzymes 03/21/2012  . Encounter for routine gynecological examination 06/14/2014  . ESOPHAGITIS 03/28/2007   Qualifier: Hospitalized for  By: Marcelino Scot CMA, [place] Bil[place]    . Fibromyalgia   . GASTRITIS 03/28/2007   Qualifier: History of  By: Marcelino Scot CMA, [place] Bilberry    . GERD (gastroesophageal reflux disease)   . Hemorrhoids    external  . HLD (hyperlipidemia)   . HNP (herniated nucleus pulposus) 11/1997   T6,7,8 with DJD  . HTN (hypertension)   . Hyperglycemia 05/13/2008   Qualifier: Diagnosis of  By: Glori Bickers MD, Carmell Austria   . Hypothyroidism   . Interstitial lung disease ([place])    pt not aware of this  . Left ovarian cyst    x 3, rupture  . Osteoarthritis    hands  . Osteopenia    mild-11/01; improved 12/05  . Other screening mammogram 08/18/2011  . Palpitations   .  PERSONAL HISTORY ALLERGY UNSPEC MEDICINAL AGENT 03/18/2010   Qualifier: Diagnosis of  By: Glori Bickers MD, Carmell Austria   . PONV (postoperative nausea and vomiting)   . Raynaud's disease   . Recurrent HSV (herpes simplex virus)    lesions in nose or mouth with frequent ulcers. d/t meds and dry mouth  . Rheumatoid arthritis ([place])   . Rhinitis 03/21/2012  . Routine general medical examination at a health care facility 03/28/2011    Family History  Problem Relation Age of Onset  . Emphysema Father        + smoker  . Lung cancer Mother        + smoker  . Coronary artery disease Mother        relatively young  . Lymphoma Sister   . Heart disease Brother   . Lymphoma Brother   . Parkinson's disease Brother   . Anemia Brother        aplastic   . Diabetes Brother   . Leukemia Son   . Iron deficiency Daughter   . Breast cancer Neg Hx    Past Surgical History:  Procedure Laterality Date  . ABDOMINAL EXPLORATION SURGERY    . ACDF  1991  . BIOPSY OF SKIN SUBCUTANEOUS TISSUE AND/OR MUCOUS MEMBRANE Left 04/08/2021   Procedure: BIOPSY OF SKIN SUBCUTANEOUS TISSUE AND/OR MUCOUS MEMBRANE ( REMOVAL SKIN  NODULE);  Surgeon: Katha Cabal, MD;  Location: [facility] ORS;  Service: Vascular;  Laterality: Left;  . COLONOSCOPY  08/2000   Diverticulosis; hemorrhoids  . HAND SURGERY Left    left thumb. PIN REMOVED  . LASIK     bilateral   Social History   Social History Narrative   Married1 Investment banker, corporate to dean at [employer] regularly exerciseDaily caffeine use: 2/day.   Lives with husband. Feels safe in her home.   Immunization History  Administered Date(s) Administered  . Fluad Quad(high Dose 65+) 08/07/2020  . Influenza Split 06/19/2011  . Influenza Whole 08/01/2009  . Influenza, High Dose Seasonal PF 08/14/2018, 07/03/2019  . Influenza,inj,Quad PF,6+ Mos 06/14/2014, 08/25/2015, 07/26/2016, 08/19/2017  . Moderna Sars-Covid-2 Vaccination 09/17/2020  . PFIZER(Purple Top)SARS-COV-2 Vaccination  11/27/2019, 12/18/2019  . Pneumococcal Conjugate-13 08/25/2015  . Pneumococcal Polysaccharide-23 08/25/2016  . Td 11/25/2003  . Tdap 06/14/2014  . Zoster, Live 09/16/2014     Objective: Vital Signs: There were no vitals taken for this visit.   Physical Exam   Musculoskeletal Exam: ***  CDAI Exam: CDAI Score: -- Patient Global: --; Provider Global: -- Swollen: --; Tender: -- Joint Exam 08/31/2021   No joint exam has been documented for this visit   There is currently no information documented on the homunculus. Go to the Rheumatology activity and complete the homunculus joint exam.  Investigation: No additional findings.  Imaging: No results found.  Recent Labs: Lab Results  Component Value Date   WBC 8.9 04/07/2021   HGB 11.1 (L) 04/07/2021   PLT 346 04/07/2021   NA 137 04/07/2021   K 4.5 04/07/2021   CL 101 04/07/2021   CO2 30 04/07/2021   GLUCOSE 135 (H) 04/07/2021   BUN 34 (H) 04/07/2021   CREATININE 0.91 04/07/2021   BILITOT 0.3 03/24/2021   ALKPHOS 81 02/06/2021   AST 20 03/24/2021   ALT 20 03/24/2021   PROT 7.4 03/24/2021   ALBUMIN 4.6 02/06/2021   CALCIUM 8.7 (L) 04/07/2021   GFRAA 75 03/24/2021   QFTBGOLDPLUS NEGATIVE 01/13/2021    Speciality Comments: No specialty comments available.  Procedures:  No procedures performed Allergies: Ceftin [cefuroxime], Clonidine derivatives, Erythromycin, Keflex [cephalexin], Penicillins, Sulfonamide derivatives, Ciprofloxacin, Fluoxetine hcl, Furosemide, Gabapentin, Paroxetine, Pregabalin, Tetracycline, Venlafaxine, Etodolac, Amitriptyline hcl, Atorvastatin, Benicar [olmesartan medoxomil], Cephalexin, Cetirizine hcl, Diovan [valsartan], and Naproxen sodium   Assessment / Plan:     Visit Diagnoses: No diagnosis found.  Orders: No orders of the defined types were placed in this encounter.  No orders of the defined types were placed in this encounter.   Face-to-face time spent with patient was *** minutes.  Greater than 50% of time was spent in counseling and coordination of care.  Follow-Up Instructions: No follow-ups on file.   Earnestine Mealing, CMA  Note - This record has been created using Editor, commissioning.  Chart creation errors have been sought, but may not always  have been located. Such creation errors do not reflect on  the standard of medical care.

## 2021-08-24 ENCOUNTER — Telehealth: Payer: Self-pay | Admitting: Pharmacist

## 2021-08-24 ENCOUNTER — Other Ambulatory Visit: Payer: Self-pay | Admitting: Pharmacist

## 2021-08-24 DIAGNOSIS — Z79899 Other long term (current) drug therapy: Secondary | ICD-10-CM

## 2021-08-24 NOTE — Telephone Encounter (Signed)
Please have patient update CBC and CMP today and then she can be scheduled for her first injection tomorrow or Wednesday.

## 2021-08-24 NOTE — Telephone Encounter (Signed)
Patient notified via MyChart to have CBC and CMP updated prior to scheduling Actemra new start  Knox Saliva, PharmD, MPH, BCPS Clinical Pharmacist (Rheumatology and Pulmonology)

## 2021-08-24 NOTE — Telephone Encounter (Signed)
-----   Message from Katha Cabal, MD sent at 08/24/2021  8:27 AM EST ----- Regarding: RE: Clearance for patient to start Actemra Yes she is cleared the last time she was in the office her wound was closed  Thanks  ----- Message ----- From: Cassandria Anger, [facility] Sent: 08/20/2021  11:38 AM EST To: Ofilia Neas, PA-C, Katha Cabal, MD Subject: Clearance for patient to start Actemra         Hi Dr. Delana Meyer,  I am the pharmacist that works with Dr. Estanislado Pandy and Hazel Sams, Hershal Coria, at [facility] in managing Cindy Nehring's rheumatoid arthritis.  Ms. Rogstad reached out to our clinic requesting to start Actemra, an immunosuppressive therapy for her rheumatoid arthritis. We had been holding on initiating treatment due to her ongoing ulcer management (to prevent delay in ulcer healing and increased risk for infection)  Could you please advise if patient is medically cleared to start Actemra at this time?  Thank you!  Knox Saliva, PharmD, MPH, BCPS Clinical Pharmacist (Rheumatology and Pulmonology)

## 2021-08-26 DIAGNOSIS — Z79899 Other long term (current) drug therapy: Secondary | ICD-10-CM | POA: Diagnosis not present

## 2021-08-27 LAB — CMP14+EGFR
ALT: 13 IU/L (ref 0–32)
AST: 12 IU/L (ref 0–40)
Albumin/Globulin Ratio: 1.8 (ref 1.2–2.2)
Albumin: 4.6 g/dL (ref 3.7–4.7)
Alkaline Phosphatase: 82 IU/L (ref 44–121)
BUN/Creatinine Ratio: 14 (ref 12–28)
BUN: 11 mg/dL (ref 8–27)
Bilirubin Total: 0.2 mg/dL (ref 0.0–1.2)
CO2: 26 mmol/L (ref 20–29)
Calcium: 9.4 mg/dL (ref 8.7–10.3)
Chloride: 93 mmol/L — ABNORMAL LOW (ref 96–106)
Creatinine, Ser: 0.78 mg/dL (ref 0.57–1.00)
Globulin, Total: 2.5 g/dL (ref 1.5–4.5)
Glucose: 176 mg/dL — ABNORMAL HIGH (ref 70–99)
Potassium: 5.2 mmol/L (ref 3.5–5.2)
Sodium: 136 mmol/L (ref 134–144)
Total Protein: 7.1 g/dL (ref 6.0–8.5)
eGFR: 81 mL/min/{1.73_m2} (ref >59)

## 2021-08-27 LAB — CBC WITH DIFFERENTIAL/PLATELET
Basophils Absolute: 0 10*3/uL (ref 0.0–0.2)
Basos: 0 %
EOS (ABSOLUTE): 0.1 10*3/uL (ref 0.0–0.4)
Eos: 1 %
Hematocrit: 38.5 % (ref 34.0–46.6)
Hemoglobin: 12.6 g/dL (ref 11.1–15.9)
Immature Grans (Abs): 0 10*3/uL (ref 0.0–0.1)
Immature Granulocytes: 0 %
Lymphocytes Absolute: 1 10*3/uL (ref 0.7–3.1)
Lymphs: 11 %
MCH: 27.4 pg (ref 26.6–33.0)
MCHC: 32.7 g/dL (ref 31.5–35.7)
MCV: 84 fL (ref 79–97)
Monocytes Absolute: 0.6 10*3/uL (ref 0.1–0.9)
Monocytes: 7 %
Neutrophils Absolute: 7.4 10*3/uL — ABNORMAL HIGH (ref 1.4–7.0)
Neutrophils: 81 %
Platelets: 359 10*3/uL (ref 150–450)
RBC: 4.6 x10E6/uL (ref 3.77–5.28)
RDW: 12.9 % (ref 11.7–15.4)
WBC: 9.2 10*3/uL (ref 3.4–10.8)

## 2021-08-27 NOTE — Telephone Encounter (Signed)
Patient will receive first dose of Actemra at [facility] with Dr. Estanislado Pandy on 08/31/21 pending labs that she had drawn on 08/26/21 have been received  Knox Saliva, PharmD, MPH, BCPS Clinical Pharmacist (Rheumatology and Pulmonology)

## 2021-08-27 NOTE — Progress Notes (Signed)
Glucose is elevated-176.  Chloride is slightly low-93.  Rest of CMP WNL.  We will continue to monitor. Absolute neutrophils are slightly elevated. Please clarify if she has had any recent infections.  Rest of CBC WNL.

## 2021-08-31 ENCOUNTER — Ambulatory Visit: Payer: Medicare Other | Admitting: Rheumatology

## 2021-08-31 ENCOUNTER — Other Ambulatory Visit: Payer: Self-pay

## 2021-08-31 DIAGNOSIS — Z79899 Other long term (current) drug therapy: Secondary | ICD-10-CM

## 2021-08-31 DIAGNOSIS — M5136 Other intervertebral disc degeneration, lumbar region: Secondary | ICD-10-CM

## 2021-08-31 DIAGNOSIS — N1831 Chronic kidney disease, stage 3a: Secondary | ICD-10-CM

## 2021-08-31 DIAGNOSIS — I7300 Raynaud's syndrome without gangrene: Secondary | ICD-10-CM

## 2021-08-31 DIAGNOSIS — F418 Other specified anxiety disorders: Secondary | ICD-10-CM

## 2021-08-31 DIAGNOSIS — M5030 Other cervical disc degeneration, unspecified cervical region: Secondary | ICD-10-CM

## 2021-08-31 DIAGNOSIS — Z8774 Personal history of (corrected) congenital malformations of heart and circulatory system: Secondary | ICD-10-CM

## 2021-08-31 DIAGNOSIS — Z8719 Personal history of other diseases of the digestive system: Secondary | ICD-10-CM

## 2021-08-31 DIAGNOSIS — E119 Type 2 diabetes mellitus without complications: Secondary | ICD-10-CM

## 2021-08-31 DIAGNOSIS — M170 Bilateral primary osteoarthritis of knee: Secondary | ICD-10-CM

## 2021-08-31 DIAGNOSIS — E1169 Type 2 diabetes mellitus with other specified complication: Secondary | ICD-10-CM

## 2021-08-31 DIAGNOSIS — L97929 Non-pressure chronic ulcer of unspecified part of left lower leg with unspecified severity: Secondary | ICD-10-CM

## 2021-08-31 DIAGNOSIS — Z981 Arthrodesis status: Secondary | ICD-10-CM

## 2021-08-31 DIAGNOSIS — M0609 Rheumatoid arthritis without rheumatoid factor, multiple sites: Secondary | ICD-10-CM

## 2021-08-31 DIAGNOSIS — M797 Fibromyalgia: Secondary | ICD-10-CM

## 2021-08-31 DIAGNOSIS — I10 Essential (primary) hypertension: Secondary | ICD-10-CM

## 2021-08-31 DIAGNOSIS — Z8639 Personal history of other endocrine, nutritional and metabolic disease: Secondary | ICD-10-CM

## 2021-08-31 DIAGNOSIS — J849 Interstitial pulmonary disease, unspecified: Secondary | ICD-10-CM

## 2021-08-31 DIAGNOSIS — M5134 Other intervertebral disc degeneration, thoracic region: Secondary | ICD-10-CM

## 2021-08-31 DIAGNOSIS — G894 Chronic pain syndrome: Secondary | ICD-10-CM

## 2021-08-31 DIAGNOSIS — Z79891 Long term (current) use of opiate analgesic: Secondary | ICD-10-CM

## 2021-08-31 DIAGNOSIS — I059 Rheumatic mitral valve disease, unspecified: Secondary | ICD-10-CM

## 2021-08-31 MED ORDER — PREDNISONE 10 MG PO TABS: 10.0000 mg | ORAL_TABLET | Freq: Every day | ORAL | 0 refills | Status: DC

## 2021-08-31 NOTE — Telephone Encounter (Signed)
Advised patient that [place] to continue current dose of prednisone until her follow up visit. We will start to slowly taper prednisone once actemra starts working. Patient verbalized understanding and requested the [facility] [address] in [place].

## 2021-08-31 NOTE — Telephone Encounter (Signed)
Ok to continue current dose of prednisone until her follow up visit. Ok to pend refill. We will start to slowly taper prednisone once actemra starts working.

## 2021-08-31 NOTE — Telephone Encounter (Signed)
Patient called stating she woke up with flu like symptoms and rescheduled her appointment to Wednesday, 09/09/21.    Patient states she only has 1 tablet left of her Prednisone and is not sure if a taper dose needs to be called into the pharmacy to help her until her appointment next week.

## 2021-08-31 NOTE — Telephone Encounter (Signed)
Patient r/s appt to 09/09/21. She will start Actemra at this [place], PharmD, MPH, BCPS Clinical Pharmacist (Rheumatology and Pulmonology)

## 2021-08-31 NOTE — Telephone Encounter (Signed)
Patient due to start Actemra at her upcoming appointment. Patient has been on Prednisone 10 mg daily. Patient wants to know if you want her continue Prednisone until after her appointment on 09/09/2021. If so she needs a [facility] [address]. Please advise.

## 2021-09-01 ENCOUNTER — Encounter: Payer: Self-pay | Admitting: Family Medicine

## 2021-09-01 ENCOUNTER — Other Ambulatory Visit: Payer: Self-pay | Admitting: Family Medicine

## 2021-09-01 NOTE — Telephone Encounter (Signed)
Pt is due for her CPE, please schedule and then route back to me so I can refill meds

## 2021-09-01 NOTE — Telephone Encounter (Signed)
Called Mrs. Kley and got her scheduled for 12/28 @12 

## 2021-09-07 ENCOUNTER — Telehealth: Payer: Self-pay | Admitting: Rheumatology

## 2021-09-07 DIAGNOSIS — R300 Dysuria: Secondary | ICD-10-CM | POA: Insufficient documentation

## 2021-09-07 DIAGNOSIS — R3915 Urgency of urination: Secondary | ICD-10-CM

## 2021-09-07 NOTE — Telephone Encounter (Signed)
Patient calling to see if doctor will order labs for a possible UTI? Patient tried to go to Urgent Care all weekend, and could not get an appointment. Patient is having Painful irritation, urgency, and cannot go at times.  Patient wants to go to [facility] in [place] for specimen. Please call to advise.

## 2021-09-07 NOTE — Addendum Note (Signed)
Addended by: Loura Pardon A on: 09/07/2021 12:29 PM   Modules accepted: Orders

## 2021-09-07 NOTE — Addendum Note (Signed)
Addended by: Tammi Sou on: 09/07/2021 04:31 PM   Modules accepted: Orders

## 2021-09-07 NOTE — Telephone Encounter (Signed)
Lab orders released into [facility]'s sxs and pt notified

## 2021-09-07 NOTE — Telephone Encounter (Signed)
FYI: Patient called to let you know PCP ordered labs for her you two discussed earlier.

## 2021-09-07 NOTE — Telephone Encounter (Signed)
Pt called stating that she has all the symptoms of a UTI( urgency to urinate and burning). Pt would like labs ordered at [facility] on [address]. Pt would like Dr Glori Bickers to read them and prescribe medication.

## 2021-09-07 NOTE — Telephone Encounter (Signed)
Patient advised Ok to place order for UA and urine culture. Patient states she is going to contact her PCP to see what they can do or if they can see her. Patient will call back to let us know if she wants [place] to release the orders.

## 2021-09-07 NOTE — Telephone Encounter (Signed)
Ok to place order for UA and urine culture.

## 2021-09-07 NOTE — Telephone Encounter (Signed)
I ordered a future ua and culture for [facility]   Thanks

## 2021-09-07 NOTE — Telephone Encounter (Signed)
Noted  

## 2021-09-07 NOTE — Progress Notes (Deleted)
Office Visit Note  Patient: Tina Berger             Date of Birth: 1951-06-12           MRN: [id number]             PCP: Abner Greenspan, MD Referring: Tower, [name], MD Visit Date: 09/09/2021 Occupation: @GUAROCC @  Subjective:    History of Present Illness: Tina Berger is a 70 y.o. female with history of seronegative rheumatoid arthritis, ILD, osteoarthritis, and fibromyalgia.  The plan is to start the patient on O[place]cia once she has received clearance by her vascular surgeon.  CBC and CMP were drawn on 08/26/2021.  TB Gold negative on 01/13/2021.  Future order for TB gold will be placed today.  Activities of Daily Living:  Patient reports morning stiffness for *** {minute/hour:19697}.   Patient {ACTIONS;DENIES/REPORTS:21021675::"Denies"} nocturnal pain.  Difficulty dressing/grooming: {ACTIONS;DENIES/REPORTS:21021675::"Denies"} Difficulty climbing stairs: {ACTIONS;DENIES/REPORTS:21021675::"Denies"} Difficulty getting out of chair: {ACTIONS;DENIES/REPORTS:21021675::"Denies"} Difficulty using hands for taps, buttons, cutlery, and/or writing: {ACTIONS;DENIES/REPORTS:21021675::"Denies"}  No Rheumatology ROS completed.   PMFS History:  Patient Active Problem List   Diagnosis Date Noted   Dysuria 09/07/2021   Rheumatoid arthritis ([place]) 05/27/2021   Chronic venous insufficiency 05/23/2021   AVM (arteriovenous malformation) 04/04/2021   CAD (coronary artery disease) 01/22/2021   Primary osteoarthritis of both knees 01/12/2021   Positive ANA (antinuclear antibody) 11/17/2020   Joint pain 11/13/2020   Elevated serum creatinine 08/26/2020   Current use of proton pump inhibitor 08/26/2020   Estrogen deficiency 08/21/2018   TMJ (dislocation of temporomandibular joint), initial encounter 05/05/2018   Anterior neck pain 05/05/2018   Chronic pain syndrome 11/09/2017   Chronic upper extremity pain [facility] Area of Pain) (Bilateral) (L>R) 11/09/2017   Fibromyalgia syndrome  11/09/2017   Osteoarthritis 11/09/2017   Osteoarthritis of lumbar facet joint (Bilateral) 11/09/2017   Lumbar facet arthropathy (Bilateral) 11/09/2017   Lumbar facet syndrome (Bilateral) (L>R) 11/09/2017   Lumbar foraminal stenosis (multilevel) (Bilateral) 11/09/2017   DDD (degenerative disc disease), lumbar 11/09/2017   Thoracic levoscoliosis 11/09/2017   Thoracic facet syndrome (Bilateral) (L>R) 11/09/2017   Thoracic facet arthropathy (Bilateral) (R>L) 11/09/2017   DDD (degenerative disc disease), thoracic 11/09/2017   Thoracolumbar IVDD 11/09/2017   DDD (degenerative disc disease), cervical 11/09/2017   Osteoarthritis of cervical facet (Bilateral) 11/09/2017   Grade 1 Anterolisthesis of C7 over T1 11/09/2017   Cervical foraminal stenosis (Bilateral) 11/09/2017   History of fusion of cervical spine (C5-6 ACDF) 11/09/2017   Cervical facet syndrome (Bilateral) 11/09/2017   Disorder of skeletal system 11/09/2017   Cervical radiculitis (Bilateral) 11/09/2017   Lumbar Epidural lipomatosis 11/09/2017   Chronic sacroiliac joint pain (Bilateral) (L>R) 11/09/2017   Chronic hip pain (Bilateral) (L>R) 11/09/2017   Chronic upper back pain (Primary Area of Pain) (midline) 10/24/2017   Chronic neck pain (Secondary Area of Pain) (Bilateral)  (L>R) 10/24/2017   Chronic low back pain (Fourth Area of Pain) (Bilateral) (L>R) 10/24/2017   Chronic lower extremity pain (Fifth Area of Pain) (Bilateral) (L>R) 10/24/2017   Lumbar Grade 1 Retrolisthesis of L1-2 and L2-3 10/24/2017   Other long term (current) drug therapy 10/24/2017   Other specified health status 10/24/2017   Long term current use of opiate analgesic 10/24/2017   Long term prescription opiate use 10/24/2017   Opiate use 10/24/2017   DM type 2 (diabetes mellitus, type 2) ([place]) 06/14/2014   Pharmacologic therapy 06/14/2014   Pedal edema    Post-menopausal 06/23/2012   Lumbar  disc disease with radiculopathy 11/18/2011   HTN (hypertension)  07/30/2011   Raynaud disease 07/30/2011   Obesity 07/30/2011   Routine general medical examination at a health care facility 03/28/2011   Palpitations 04/27/2010   Problems influencing health status 12/11/2009   Depression with anxiety 06/14/2008   Vitamin D deficiency 05/13/2008   ANXIETY 03/25/2008   Osteopenia 03/25/2008   Hypothyroidism 03/28/2007   Hyperlipidemia associated with type 2 diabetes mellitus ([place]) 03/28/2007   Other iron deficiency anemias 03/28/2007   PANIC ATTACK 03/28/2007   KERATOCONJUNCTIVITIS SICCA 03/28/2007   Mitral valve disorder 03/28/2007   ABNORMAL HEART RHYTHMS 03/28/2007   Raynaud's syndrome 03/28/2007   GERD 03/28/2007   IBS 03/28/2007   Rosacea 03/28/2007   PLANTAR FASCIITIS 03/28/2007   MIGRAINES, HX OF 03/28/2007    Past Medical History:  Diagnosis Date   Acute renal insufficiency 05/31/2014   pt not aware of this diagnosis   Anemia, iron deficiency    Anxiety    Bilateral lower extremity edema    a. uses torsemide   Cervical dysplasia    abnormal paps   Colon cancer screening 06/14/2014   Coronary artery disease    Cough 08/08/2012   Degenerative disk disease 11/19/2011   Depression    Diabetes mellitus type II    Diverticulosis    DJD (degenerative joint disease)    Drug rash 05/22/2011   Dry eyes    Dysrhythmia    metoprolol.   Edema    Elevated liver enzymes 03/21/2012   Encounter for routine gynecological examination 06/14/2014   ESOPHAGITIS 03/28/2007   Qualifier: Hospitalized for  By: Marcelino Scot CMA, [place]     Fibromyalgia    GASTRITIS 03/28/2007   Qualifier: History of  By: Marcelino Scot CMA, [place] Bilberry     GERD (gastroesophageal reflux disease)    Hemorrhoids    external   HLD (hyperlipidemia)    HNP (herniated nucleus pulposus) 11/1997   T6,7,8 with DJD   HTN (hypertension)    Hyperglycemia 05/13/2008   Qualifier: Diagnosis of  By: Glori Bickers MD, Carmell [place]    Hypothyroidism    Interstitial lung disease ([place])     pt not aware of this   Left ovarian cyst    x 3, rupture   Osteoarthritis    hands   Osteopenia    mild-11/01; improved 12/05   Other screening mammogram 08/18/2011   Palpitations    PERSONAL HISTORY ALLERGY UNSPEC MEDICINAL AGENT 03/18/2010   Qualifier: Diagnosis of  By: Glori Bickers MD, Carmell [place]    PONV (postoperative nausea and vomiting)    Raynaud's disease    Recurrent HSV (herpes simplex virus)    lesions in nose or mouth with frequent ulcers. d/t meds and dry mouth   Rheumatoid arthritis ([place])    Rhinitis 03/21/2012   Routine general medical examination at a health care facility 03/28/2011    Family History  Problem Relation Age of Onset   Emphysema Father        + smoker   Lung cancer Mother        + smoker   Coronary artery disease Mother        relatively young   Lymphoma Sister    Heart disease Brother    Lymphoma Brother    Parkinson's disease Brother    Anemia Brother        aplastic    Diabetes Brother    Leukemia Son    Iron deficiency Daughter    Breast  cancer Neg Hx    Past Surgical History:  Procedure Laterality Date   ABDOMINAL EXPLORATION SURGERY     ACDF  1991   BIOPSY OF SKIN SUBCUTANEOUS TISSUE AND/OR MUCOUS MEMBRANE Left 04/08/2021   Procedure: BIOPSY OF SKIN SUBCUTANEOUS TISSUE AND/OR MUCOUS MEMBRANE ( REMOVAL SKIN NODULE);  Surgeon: Katha Cabal, MD;  Location: [facility] ORS;  Service: Vascular;  Laterality: Left;   COLONOSCOPY  08/2000   Diverticulosis; hemorrhoids   HAND SURGERY Left    left thumb. PIN REMOVED   LASIK     bilateral   Social History   Social History Narrative   Married1 Investment banker, corporate to dean at [employer] regularly exerciseDaily caffeine use: 2/day.   Lives with husband. Feels safe in her home.   Immunization History  Administered Date(s) Administered   Fluad Quad(high Dose 65+) 08/07/2020   Influenza Split 06/19/2011   Influenza Whole 08/01/2009   Influenza, High Dose Seasonal PF 08/14/2018, 07/03/2019    Influenza,inj,Quad PF,6+ Mos 06/14/2014, 08/25/2015, 07/26/2016, 08/19/2017   Moderna Sars-Covid-2 Vaccination 09/17/2020   PFIZER(Purple Top)SARS-COV-2 Vaccination 11/27/2019, 12/18/2019   Pneumococcal Conjugate-13 08/25/2015   Pneumococcal Polysaccharide-23 08/25/2016   Td 11/25/2003   Tdap 06/14/2014   Zoster, Live 09/16/2014     Objective: Vital Signs: There were no vitals taken for this visit.   Physical Exam Vitals and nursing note reviewed.  Constitutional:      Appearance: She is well-developed.  HENT:     Head: Normocephalic and atraumatic.  Eyes:     Conjunctiva/sclera: Conjunctivae normal.  Pulmonary:     Effort: Pulmonary effort is normal.  Abdominal:     Palpations: Abdomen is soft.  Musculoskeletal:     Cervical back: Normal range of motion.  Skin:    General: Skin is warm and dry.     Capillary Refill: Capillary refill takes less than 2 seconds.  Neurological:     Mental Status: She is alert and oriented to person, place, and time.  Psychiatric:        Behavior: Behavior normal.     Musculoskeletal Exam: ***  CDAI Exam: CDAI Score: -- Patient Global: --; Provider Global: -- Swollen: --; Tender: -- Joint Exam 09/09/2021   No joint exam has been documented for this visit   There is currently no information documented on the homunculus. Go to the Rheumatology activity and complete the homunculus joint exam.  Investigation: No additional findings.  Imaging: No results found.  Recent Labs: Lab Results  Component Value Date   WBC 9.2 08/26/2021   HGB 12.6 08/26/2021   PLT 359 08/26/2021   NA 136 08/26/2021   K 5.2 08/26/2021   CL 93 (L) 08/26/2021   CO2 26 08/26/2021   GLUCOSE 176 (H) 08/26/2021   BUN 11 08/26/2021   CREATININE 0.78 08/26/2021   BILITOT 0.2 08/26/2021   ALKPHOS 82 08/26/2021   AST 12 08/26/2021   ALT 13 08/26/2021   PROT 7.1 08/26/2021   ALBUMIN 4.6 08/26/2021   CALCIUM 9.4 08/26/2021   GFRAA 75 03/24/2021    QFTBGOLDPLUS NEGATIVE 01/13/2021    Speciality Comments: No specialty comments available.  Procedures:  No procedures performed Allergies: Ceftin [cefuroxime], Clonidine derivatives, Erythromycin, Keflex [cephalexin], Penicillins, Sulfonamide derivatives, Ciprofloxacin, Fluoxetine hcl, Furosemide, Gabapentin, Paroxetine, Pregabalin, Tetracycline, Venlafaxine, Etodolac, Amitriptyline hcl, Atorvastatin, Benicar [olmesartan medoxomil], Cephalexin, Cetirizine hcl, Diovan [valsartan], and Naproxen sodium   Assessment / Plan:     Visit Diagnoses: Rheumatoid arthritis of multiple sites with negative rheumatoid factor (HCC)  High risk  medication use  ILD (interstitial lung disease) ([place])  Raynaud's disease without gangrene  Hx of arteriovenous malformation (AVM)  Venous ulcer of left leg (HCC)  Primary osteoarthritis of both knees  DDD (degenerative disc disease), cervical  History of fusion of cervical spine (C5-6 ACDF)  DDD (degenerative disc disease), thoracic  DDD (degenerative disc disease), lumbar  Fibromyalgia syndrome  Chronic pain syndrome  Type 2 diabetes mellitus without complication, without long-term current use of insulin (HCC)  Chronic renal impairment, stage 3a ([place])  Long term current use of opiate analgesic  History of hypothyroidism  Primary hypertension  Mitral valve disorder  Hyperlipidemia associated with type 2 diabetes mellitus ([place])  History of gastroesophageal reflux (GERD)  Depression with anxiety  History of IBS  Orders: No orders of the defined types were placed in this encounter.  No orders of the defined types were placed in this encounter.      Ofilia Neas, PA-C  Note - This record has been created using Dragon software.  Chart creation errors have been sought, but may not always  have been located. Such creation errors do not reflect on  the standard of medical care.

## 2021-09-08 DIAGNOSIS — R300 Dysuria: Secondary | ICD-10-CM | POA: Diagnosis not present

## 2021-09-08 DIAGNOSIS — R3915 Urgency of urination: Secondary | ICD-10-CM | POA: Diagnosis not present

## 2021-09-09 ENCOUNTER — Ambulatory Visit: Payer: Medicare Other | Admitting: Physician Assistant

## 2021-09-09 DIAGNOSIS — G894 Chronic pain syndrome: Secondary | ICD-10-CM

## 2021-09-09 DIAGNOSIS — Z79899 Other long term (current) drug therapy: Secondary | ICD-10-CM

## 2021-09-09 DIAGNOSIS — Z8719 Personal history of other diseases of the digestive system: Secondary | ICD-10-CM

## 2021-09-09 DIAGNOSIS — Z981 Arthrodesis status: Secondary | ICD-10-CM

## 2021-09-09 DIAGNOSIS — M5136 Other intervertebral disc degeneration, lumbar region: Secondary | ICD-10-CM

## 2021-09-09 DIAGNOSIS — Z8639 Personal history of other endocrine, nutritional and metabolic disease: Secondary | ICD-10-CM

## 2021-09-09 DIAGNOSIS — J849 Interstitial pulmonary disease, unspecified: Secondary | ICD-10-CM

## 2021-09-09 DIAGNOSIS — M5134 Other intervertebral disc degeneration, thoracic region: Secondary | ICD-10-CM

## 2021-09-09 DIAGNOSIS — I83029 Varicose veins of left lower extremity with ulcer of unspecified site: Secondary | ICD-10-CM

## 2021-09-09 DIAGNOSIS — Z8774 Personal history of (corrected) congenital malformations of heart and circulatory system: Secondary | ICD-10-CM

## 2021-09-09 DIAGNOSIS — I059 Rheumatic mitral valve disease, unspecified: Secondary | ICD-10-CM

## 2021-09-09 DIAGNOSIS — M0609 Rheumatoid arthritis without rheumatoid factor, multiple sites: Secondary | ICD-10-CM

## 2021-09-09 DIAGNOSIS — M170 Bilateral primary osteoarthritis of knee: Secondary | ICD-10-CM

## 2021-09-09 DIAGNOSIS — I10 Essential (primary) hypertension: Secondary | ICD-10-CM

## 2021-09-09 DIAGNOSIS — Z79891 Long term (current) use of opiate analgesic: Secondary | ICD-10-CM

## 2021-09-09 DIAGNOSIS — M5030 Other cervical disc degeneration, unspecified cervical region: Secondary | ICD-10-CM

## 2021-09-09 DIAGNOSIS — E119 Type 2 diabetes mellitus without complications: Secondary | ICD-10-CM

## 2021-09-09 DIAGNOSIS — I7300 Raynaud's syndrome without gangrene: Secondary | ICD-10-CM

## 2021-09-09 DIAGNOSIS — F418 Other specified anxiety disorders: Secondary | ICD-10-CM

## 2021-09-09 DIAGNOSIS — E1169 Type 2 diabetes mellitus with other specified complication: Secondary | ICD-10-CM

## 2021-09-09 DIAGNOSIS — N1831 Chronic kidney disease, stage 3a: Secondary | ICD-10-CM

## 2021-09-09 DIAGNOSIS — M797 Fibromyalgia: Secondary | ICD-10-CM

## 2021-09-09 LAB — URINALYSIS, ROUTINE W REFLEX MICROSCOPIC
Bilirubin, UA: NEGATIVE
Glucose, UA: NEGATIVE
Nitrite, UA: POSITIVE — AB
RBC, UA: NEGATIVE
Specific Gravity, UA: 1.023 (ref 1.005–1.030)
Urobilinogen, Ur: 1 mg/dL (ref 0.2–1.0)
pH, UA: 6 (ref 5.0–7.5)

## 2021-09-09 LAB — MICROSCOPIC EXAMINATION
Casts: NONE SEEN /lpf
RBC, Urine: NONE SEEN /hpf (ref 0–2)
WBC, UA: >30 /hpf — AB (ref 0–5)

## 2021-09-09 NOTE — Progress Notes (Signed)
UA findings are consistent with a UTI.  Please notify the patient.  We will need to hold off on starting actemra until the infection has completely cleared.  Please advise the patient to reach out to Dr. Glori Bickers to discuss treatment options.

## 2021-09-10 ENCOUNTER — Telehealth: Payer: Self-pay | Admitting: Family Medicine

## 2021-09-10 DIAGNOSIS — N3000 Acute cystitis without hematuria: Secondary | ICD-10-CM

## 2021-09-10 MED ORDER — NITROFURANTOIN MONOHYD MACRO 100 MG PO CAPS: 100.0000 mg | ORAL_CAPSULE | Freq: Two times a day (BID) | ORAL | 0 refills | Status: DC

## 2021-09-10 NOTE — Telephone Encounter (Signed)
Rec word from rheumatology of uti  Sent pt mychart message Send macrobid to her pharmacy

## 2021-09-13 ENCOUNTER — Encounter: Payer: Self-pay | Admitting: Family Medicine

## 2021-09-13 LAB — URINE CULTURE

## 2021-09-14 ENCOUNTER — Encounter: Payer: Self-pay | Admitting: Family Medicine

## 2021-09-14 NOTE — Telephone Encounter (Signed)
I think our only oral medication option for uti is Fosfomycin (I did a mychart message to her about it)  Please see if she has had this before and what pharmacy she prefers   She is allergic to a number of medicines  Her culture shows resistance as well   I will cc Dr Garen Grams so she is aware

## 2021-09-14 NOTE — Addendum Note (Signed)
Addended by: Loura Pardon A on: 09/14/2021 02:03 PM   Modules accepted: Orders

## 2021-09-14 NOTE — Addendum Note (Signed)
Addended by: Loura Pardon A on: 09/14/2021 07:59 AM   Modules accepted: Orders

## 2021-09-14 NOTE — Assessment & Plan Note (Signed)
Recent e coli infection dx from rheumatology  Multi drug resistant Pt has drug all to pcn, cephalosporins and sulfa  Cannot start her biologic tx until treated   Symptomatic/azo helps   Ref done to urology

## 2021-09-14 NOTE — Telephone Encounter (Signed)
Thank you for the update.  It has been a difficult situation.  She cannot take any immunosuppressive agent unless UTIs treated.  She is usually concerned about all the side effects of the medications.  It would be difficult to convince her to take immunosuppressive agents as well.

## 2021-09-14 NOTE — Telephone Encounter (Signed)
Reviewed mychart note-pt declines fosfomycin for now/afraid of side effects Requests ref to urology (local) if possible to address uti     Urgent referral done [place]-please notify pt care coordinator

## 2021-09-14 NOTE — Telephone Encounter (Signed)
I called pt and discussed our options for treatment  Fosfomycin is the only option I know of  She is nervous about it and will mychart message me back later today   Would consider ref to ID if needed to discuss other tx opt

## 2021-09-14 NOTE — Progress Notes (Deleted)
Office Visit Note  Patient: Tina Berger             Date of Birth: 18-Jul-1951           MRN: [id number]             PCP: Abner Greenspan, MD Referring: Tower, [name], MD Visit Date: 09/21/2021 Occupation: @GUAROCC @  Subjective:  No chief complaint on file.   History of Present Illness: Tina Berger is a 70 y.o. female ***   Activities of Daily Living:  Patient reports morning stiffness for *** {minute/hour:19697}.   Patient {ACTIONS;DENIES/REPORTS:21021675::"Denies"} nocturnal pain.  Difficulty dressing/grooming: {ACTIONS;DENIES/REPORTS:21021675::"Denies"} Difficulty climbing stairs: {ACTIONS;DENIES/REPORTS:21021675::"Denies"} Difficulty getting out of chair: {ACTIONS;DENIES/REPORTS:21021675::"Denies"} Difficulty using hands for taps, buttons, cutlery, and/or writing: {ACTIONS;DENIES/REPORTS:21021675::"Denies"}  No Rheumatology ROS completed.   PMFS History:  Patient Active Problem List   Diagnosis Date Noted   Dysuria 09/07/2021   Rheumatoid arthritis ([place]) 05/27/2021   Chronic venous insufficiency 05/23/2021   AVM (arteriovenous malformation) 04/04/2021   CAD (coronary artery disease) 01/22/2021   Primary osteoarthritis of both knees 01/12/2021   Positive ANA (antinuclear antibody) 11/17/2020   Joint pain 11/13/2020   Elevated serum creatinine 08/26/2020   Current use of proton pump inhibitor 08/26/2020   Estrogen deficiency 08/21/2018   TMJ (dislocation of temporomandibular joint), initial encounter 05/05/2018   Anterior neck pain 05/05/2018   Chronic pain syndrome 11/09/2017   Chronic upper extremity pain [facility] Area of Pain) (Bilateral) (L>R) 11/09/2017   Fibromyalgia syndrome 11/09/2017   Osteoarthritis 11/09/2017   Osteoarthritis of lumbar facet joint (Bilateral) 11/09/2017   Lumbar facet arthropathy (Bilateral) 11/09/2017   Lumbar facet syndrome (Bilateral) (L>R) 11/09/2017   Lumbar foraminal stenosis (multilevel) (Bilateral) 11/09/2017   DDD  (degenerative disc disease), lumbar 11/09/2017   Thoracic levoscoliosis 11/09/2017   Thoracic facet syndrome (Bilateral) (L>R) 11/09/2017   Thoracic facet arthropathy (Bilateral) (R>L) 11/09/2017   DDD (degenerative disc disease), thoracic 11/09/2017   Thoracolumbar IVDD 11/09/2017   DDD (degenerative disc disease), cervical 11/09/2017   Osteoarthritis of cervical facet (Bilateral) 11/09/2017   Grade 1 Anterolisthesis of C7 over T1 11/09/2017   Cervical foraminal stenosis (Bilateral) 11/09/2017   History of fusion of cervical spine (C5-6 ACDF) 11/09/2017   Cervical facet syndrome (Bilateral) 11/09/2017   Disorder of skeletal system 11/09/2017   Cervical radiculitis (Bilateral) 11/09/2017   Lumbar Epidural lipomatosis 11/09/2017   Chronic sacroiliac joint pain (Bilateral) (L>R) 11/09/2017   Chronic hip pain (Bilateral) (L>R) 11/09/2017   Chronic upper back pain (Primary Area of Pain) (midline) 10/24/2017   Chronic neck pain (Secondary Area of Pain) (Bilateral)  (L>R) 10/24/2017   Chronic low back pain (Fourth Area of Pain) (Bilateral) (L>R) 10/24/2017   Chronic lower extremity pain (Fifth Area of Pain) (Bilateral) (L>R) 10/24/2017   Lumbar Grade 1 Retrolisthesis of L1-2 and L2-3 10/24/2017   Other long term (current) drug therapy 10/24/2017   Other specified health status 10/24/2017   Long term current use of opiate analgesic 10/24/2017   Long term prescription opiate use 10/24/2017   Opiate use 10/24/2017   DM type 2 (diabetes mellitus, type 2) ([place]) 06/14/2014   Pharmacologic therapy 06/14/2014   Pedal edema    Post-menopausal 06/23/2012   Lumbar disc disease with radiculopathy 11/18/2011   HTN (hypertension) 07/30/2011   Raynaud disease 07/30/2011   Obesity 07/30/2011   Routine general medical examination at a health care facility 03/28/2011   Palpitations 04/27/2010   Problems influencing health status 12/11/2009   Depression with  anxiety 06/14/2008   Vitamin D deficiency  05/13/2008   ANXIETY 03/25/2008   Osteopenia 03/25/2008   Hypothyroidism 03/28/2007   Hyperlipidemia associated with type 2 diabetes mellitus ([place]) 03/28/2007   Other iron deficiency anemias 03/28/2007   PANIC ATTACK 03/28/2007   KERATOCONJUNCTIVITIS SICCA 03/28/2007   Mitral valve disorder 03/28/2007   ABNORMAL HEART RHYTHMS 03/28/2007   Raynaud's syndrome 03/28/2007   GERD 03/28/2007   IBS 03/28/2007   Rosacea 03/28/2007   PLANTAR FASCIITIS 03/28/2007   MIGRAINES, HX OF 03/28/2007    Past Medical History:  Diagnosis Date   Acute renal insufficiency 05/31/2014   pt not aware of this diagnosis   Anemia, iron deficiency    Anxiety    Bilateral lower extremity edema    a. uses torsemide   Cervical dysplasia    abnormal paps   Colon cancer screening 06/14/2014   Coronary artery disease    Cough 08/08/2012   Degenerative disk disease 11/19/2011   Depression    Diabetes mellitus type II    Diverticulosis    DJD (degenerative joint disease)    Drug rash 05/22/2011   Dry eyes    Dysrhythmia    metoprolol.   Edema    Elevated liver enzymes 03/21/2012   Encounter for routine gynecological examination 06/14/2014   ESOPHAGITIS 03/28/2007   Qualifier: Hospitalized for  By: Marcelino Scot CMA, [place] Bilberry     Fibromyalgia    GASTRITIS 03/28/2007   Qualifier: History of  By: Marcelino Scot CMA, [place] Bilberry     GERD (gastroesophageal reflux disease)    Hemorrhoids    external   HLD (hyperlipidemia)    HNP (herniated nucleus pulposus) 11/1997   T6,7,8 with DJD   HTN (hypertension)    Hyperglycemia 05/13/2008   Qualifier: Diagnosis of  By: Glori Bickers MD, Carmell Austria    Hypothyroidism    Interstitial lung disease ([place])    pt not aware of this   Left ovarian cyst    x 3, rupture   Osteoarthritis    hands   Osteopenia    mild-11/01; improved 12/05   Other screening mammogram 08/18/2011   Palpitations    PERSONAL HISTORY ALLERGY UNSPEC MEDICINAL AGENT 03/18/2010   Qualifier: Diagnosis of   By: Glori Bickers MD, Carmell Austria    PONV (postoperative nausea and vomiting)    Raynaud's disease    Recurrent HSV (herpes simplex virus)    lesions in nose or mouth with frequent ulcers. d/t meds and dry mouth   Rheumatoid arthritis ([place])    Rhinitis 03/21/2012   Routine general medical examination at a health care facility 03/28/2011    Family History  Problem Relation Age of Onset   Emphysema Father        + smoker   Lung cancer Mother        + smoker   Coronary artery disease Mother        relatively young   Lymphoma Sister    Heart disease Brother    Lymphoma Brother    Parkinson's disease Brother    Anemia Brother        aplastic    Diabetes Brother    Leukemia Son    Iron deficiency Daughter    Breast cancer Neg Hx    Past Surgical History:  Procedure Laterality Date   ABDOMINAL EXPLORATION SURGERY     ACDF  1991   BIOPSY OF SKIN SUBCUTANEOUS TISSUE AND/OR MUCOUS MEMBRANE Left 04/08/2021   Procedure: BIOPSY OF SKIN SUBCUTANEOUS TISSUE AND/OR  MUCOUS MEMBRANE ( REMOVAL SKIN NODULE);  Surgeon: Katha Cabal, MD;  Location: [facility] ORS;  Service: Vascular;  Laterality: Left;   COLONOSCOPY  08/2000   Diverticulosis; hemorrhoids   HAND SURGERY Left    left thumb. PIN REMOVED   LASIK     bilateral   Social History   Social History Narrative   Married1 Investment banker, corporate to dean at [employer] regularly exerciseDaily caffeine use: 2/day.   Lives with husband. Feels safe in her home.   Immunization History  Administered Date(s) Administered   Fluad Quad(high Dose 65+) 08/07/2020   Influenza Split 06/19/2011   Influenza Whole 08/01/2009   Influenza, High Dose Seasonal PF 08/14/2018, 07/03/2019   Influenza,inj,Quad PF,6+ Mos 06/14/2014, 08/25/2015, 07/26/2016, 08/19/2017   Moderna Sars-Covid-2 Vaccination 09/17/2020   PFIZER(Purple Top)SARS-COV-2 Vaccination 11/27/2019, 12/18/2019   Pneumococcal Conjugate-13 08/25/2015   Pneumococcal Polysaccharide-23 08/25/2016   Td 11/25/2003    Tdap 06/14/2014   Zoster, Live 09/16/2014     Objective: Vital Signs: There were no vitals taken for this visit.   Physical Exam   Musculoskeletal Exam: ***  CDAI Exam: CDAI Score: -- Patient Global: --; Provider Global: -- Swollen: --; Tender: -- Joint Exam 09/21/2021   No joint exam has been documented for this visit   There is currently no information documented on the homunculus. Go to the Rheumatology activity and complete the homunculus joint exam.  Investigation: No additional findings.  Imaging: No results found.  Recent Labs: Lab Results  Component Value Date   WBC 9.2 08/26/2021   HGB 12.6 08/26/2021   PLT 359 08/26/2021   NA 136 08/26/2021   K 5.2 08/26/2021   CL 93 (L) 08/26/2021   CO2 26 08/26/2021   GLUCOSE 176 (H) 08/26/2021   BUN 11 08/26/2021   CREATININE 0.78 08/26/2021   BILITOT 0.2 08/26/2021   ALKPHOS 82 08/26/2021   AST 12 08/26/2021   ALT 13 08/26/2021   PROT 7.1 08/26/2021   ALBUMIN 4.6 08/26/2021   CALCIUM 9.4 08/26/2021   GFRAA 75 03/24/2021   QFTBGOLDPLUS NEGATIVE 01/13/2021    Speciality Comments: No specialty comments available.  Procedures:  No procedures performed Allergies: Ceftin [cefuroxime], Clonidine derivatives, Erythromycin, Keflex [cephalexin], Penicillins, Sulfonamide derivatives, Ciprofloxacin, Fluoxetine hcl, Furosemide, Gabapentin, Paroxetine, Pregabalin, Tetracycline, Venlafaxine, Etodolac, Macrobid [nitrofurantoin], Amitriptyline hcl, Atorvastatin, Benicar [olmesartan medoxomil], Cephalexin, Cetirizine hcl, Diovan [valsartan], and Naproxen sodium   Assessment / Plan:     Visit Diagnoses: No diagnosis found.  Orders: No orders of the defined types were placed in this encounter.  No orders of the defined types were placed in this encounter.   Face-to-face time spent with patient was *** minutes. Greater than 50% of time was spent in counseling and coordination of care.  Follow-Up Instructions: No  follow-ups on file.   Earnestine Mealing, CMA  Note - This record has been created using Editor, commissioning.  Chart creation errors have been sought, but may not always  have been located. Such creation errors do not reflect on  the standard of medical care.

## 2021-09-14 NOTE — Telephone Encounter (Signed)
Message sent to Ashtyn letting her know about referral

## 2021-09-14 NOTE — Telephone Encounter (Signed)
Pt notified of Dr. Marliss Coots comments and has some concerns. Pt said she heard fosfomycin is expensive, I did advise her it depends on her insurance so I can't tell her how much it would cost, pt understood but her main concern is 2 issues.  She researched med and it has "terrible side effects" and given her intolerance/ allergies she feels that the side effects may set her back Pt has never taken this medication before and can not say at all if she is allergic to this medication. Pt again stated that she has been trying for months to treat her RA and she doesn't want to take a med that will set back her treatment if she has a "bad reaction" to it. Pt said she just doesn't know what to do.  Please advise, [facility] [address] [employer].

## 2021-09-14 NOTE — Telephone Encounter (Deleted)
Pt responded via mychart. Please see mychart response

## 2021-09-16 ENCOUNTER — Other Ambulatory Visit: Payer: Self-pay

## 2021-09-16 ENCOUNTER — Ambulatory Visit: Payer: Medicare Other | Admitting: Urology

## 2021-09-16 ENCOUNTER — Encounter: Payer: Self-pay | Admitting: Urology

## 2021-09-16 VITALS — BP 156/87 | HR 73 | Ht 63.0 in | Wt 200.0 lb

## 2021-09-16 DIAGNOSIS — N3000 Acute cystitis without hematuria: Secondary | ICD-10-CM | POA: Diagnosis not present

## 2021-09-16 LAB — MICROSCOPIC EXAMINATION: RBC, Urine: NONE SEEN /hpf (ref 0–2)

## 2021-09-16 LAB — URINALYSIS, COMPLETE
Bilirubin, UA: NEGATIVE
Glucose, UA: NEGATIVE
Ketones, UA: NEGATIVE
Leukocytes,UA: NEGATIVE
Nitrite, UA: NEGATIVE
Protein,UA: NEGATIVE
RBC, UA: NEGATIVE
Specific Gravity, UA: 1.01 (ref 1.005–1.030)
Urobilinogen, Ur: 0.2 mg/dL (ref 0.2–1.0)
pH, UA: 6 (ref 5.0–7.5)

## 2021-09-16 LAB — BLADDER SCAN AMB NON-IMAGING: Scan Result: 147

## 2021-09-16 NOTE — Progress Notes (Signed)
In and Out Catheterization  Patient is present today for a I & O catheterization due to cystitis. Patient was cleaned and prepped in a sterile fashion with betadine . A 14FR cath was inserted no complications were noted , 83ml of urine return was noted, urine was light yellow in color. A clean urine sample was collected for UA. Bladder was drained  And catheter was removed with out difficulty.    Performed by: Elberta Leatherwood, [place]

## 2021-09-16 NOTE — Progress Notes (Signed)
09/16/2021 2:25 PM   Tina Berger 02-04-1951 [id number]  Referring provider: Abner Greenspan, MD [address] [place],  [place] [address]  Chief Complaint  Patient presents with   Cystitis    HPI: 70 y.o. female presents for evaluation of recent episode of cystitis.  History of recurrent UTIs in 2013 that was managed with a low-dose antibiotic suppression with nitrofurantoin.  Subsequently was hospitalized for interstitial fibrosis and the medication was discontinued.  She had no recurrent symptoms until l earlier this month Recently diagnosed with rheumatoid arthritis and was to be started on Actemra however just prior to starting was complaining of frequency, urgency and dysuria UA 09/08/2021 was nitrite + with >30 RBC and many bacteria UA grew multidrug-resistant E. Coli She has multiple antibiotic allergies/intolerances and Dr. Glori Bickers recommended fosfomycin.  She was concerned about possible side effects of this medication and elected not to start therapy.  Symptoms subsequently resolved and she is presently asymptomatic CT abdomen pelvis June 2021 showed no upper tract abnormalities   PMH: Past Medical History:  Diagnosis Date   Acute renal insufficiency 05/31/2014   pt not aware of this diagnosis   Anemia, iron deficiency    Anxiety    Bilateral lower extremity edema    a. uses torsemide   Cervical dysplasia    abnormal paps   Colon cancer screening 06/14/2014   Coronary artery disease    Cough 08/08/2012   Degenerative disk disease 11/19/2011   Depression    Diabetes mellitus type II    Diverticulosis    DJD (degenerative joint disease)    Drug rash 05/22/2011   Dry eyes    Dysrhythmia    metoprolol.   Edema    Elevated liver enzymes 03/21/2012   Encounter for routine gynecological examination 06/14/2014   ESOPHAGITIS 03/28/2007   Qualifier: Hospitalized for  By: Marcelino Scot CMA, [place] Bilberry     Fibromyalgia    GASTRITIS 03/28/2007   Qualifier:  History of  By: Marcelino Scot CMA, [place] Bilberry     GERD (gastroesophageal reflux disease)    Hemorrhoids    external   HLD (hyperlipidemia)    HNP (herniated nucleus pulposus) 11/1997   T6,7,8 with DJD   HTN (hypertension)    Hyperglycemia 05/13/2008   Qualifier: Diagnosis of  By: Glori Bickers MD, Carmell Austria    Hypothyroidism    Interstitial lung disease ([place])    pt not aware of this   Left ovarian cyst    x 3, rupture   Osteoarthritis    hands   Osteopenia    mild-11/01; improved 12/05   Other screening mammogram 08/18/2011   Palpitations    PERSONAL HISTORY ALLERGY UNSPEC MEDICINAL AGENT 03/18/2010   Qualifier: Diagnosis of  By: Glori Bickers MD, Carmell Austria    PONV (postoperative nausea and vomiting)    Raynaud's disease    Recurrent HSV (herpes simplex virus)    lesions in nose or mouth with frequent ulcers. d/t meds and dry mouth   Rheumatoid arthritis ([place])    Rhinitis 03/21/2012   Routine general medical examination at a health care facility 03/28/2011    Surgical History: Past Surgical History:  Procedure Laterality Date   ABDOMINAL EXPLORATION SURGERY     ACDF  1991   BIOPSY OF SKIN SUBCUTANEOUS TISSUE AND/OR MUCOUS MEMBRANE Left 04/08/2021   Procedure: BIOPSY OF SKIN SUBCUTANEOUS TISSUE AND/OR MUCOUS MEMBRANE ( REMOVAL SKIN NODULE);  Surgeon: Katha Cabal, MD;  Location: [facility] ORS;  Service: Vascular;  Laterality: Left;   COLONOSCOPY  08/2000   Diverticulosis; hemorrhoids   HAND SURGERY Left    left thumb. PIN REMOVED   LASIK     bilateral    Home Medications:  Allergies as of 09/16/2021       Reactions   Ceftin [cefuroxime] Swelling   Swelling, "legs turn blue".  Legs swelling, then blue, then a rash   Clonidine Derivatives    Swelling of lips   Erythromycin Swelling   Rash, swollen gums   Keflex [cephalexin] Anaphylaxis   Chest was broken out in a rash. Lips were swelling. Took a few days to occur, but it kept getting worse.                                                                                                                                                                                                    Penicillins Anaphylaxis   Pt had a daughter with an allergy to this at a young age. Patient developed blisters anywhere her child touched her. Also, if she used the bathroom after daughter did while on keflex, it would cause a reaction for her. Lips and roof of mouth swelled up also.   Sulfonamide Derivatives Swelling   Rash, swollen gums, lips   Ciprofloxacin    REACTION: ? rash vs sun rxn. Rash    Fluoxetine Hcl    REACTION: stomach problems. Severe pain in abdomen. Could not function d/t pain   Furosemide Swelling   REACTION: swelling worsened in legs once taking   Gabapentin Other (See Comments)   REACTION: edema of feet. Unable to get shoes on   Paroxetine    REACTION: weight gain (30 pound weight gain   Pregabalin    REACTION: swelling of feet and legs. Unable to get shoes on.   Tetracycline    REACTION:inflammed genitals.    Venlafaxine    REACTION: sweating profusely   Etodolac    REACTION: reaction not known   Macrobid [nitrofurantoin]    REACTION: ? Lung problem   Amitriptyline Hcl    REACTION: sedating   Atorvastatin    REACTION: muscle twitch and pain   Benicar [olmesartan Medoxomil]    Muscle pain    Cephalexin Hives, Rash   Cetirizine Hcl    REACTION: headache   Diovan [valsartan]    Thought it made her feel confused   Naproxen Sodium Other (See Comments)   REACTION: edema of feet and legs.  Not good for ckd        Medication List        Accurate as  of September 16, 2021  2:25 PM. If you have any questions, ask your nurse or doctor.          ALKA-SELTZER PLUS COLD PO Take by mouth as needed.   buPROPion 150 MG 24 hr tablet Commonly known as: WELLBUTRIN XL TAKE 1 TABLET BY MOUTH  DAILY   cholecalciferol 25 MCG (1000 UNIT) tablet Commonly known as: VITAMIN D3 Take 1,000 Units by mouth daily.    cyclobenzaprine 10 MG tablet Commonly known as: FLEXERIL TAKE 1/2 TO 1 TABLET BY  MOUTH ONCE DAILY AND 1  TABLET AT BEDTIME   Diclofenac Sodium 1.5 % Soln Place 2 mLs onto the skin 4 (four) times daily.   DULoxetine 60 MG capsule Commonly known as: CYMBALTA Take 1 capsule (60 mg total) by mouth daily.   esomeprazole 20 MG capsule Commonly known as: NEXIUM TAKE 1 CAPSULE BY MOUTH  DAILY   famotidine 40 MG tablet Commonly known as: PEPCID TAKE 1 TABLET BY MOUTH  DAILY   ferrous sulfate 325 (65 FE) MG EC tablet Take 325 mg by mouth daily with breakfast.   glucose blood test strip One Touch Ultra stripts blue-To check sugar once daily and as needed for DM2 250.00   GUAIFENESIN CR PO Take by mouth as needed.   HYDROmorphone HCl 12 MG T24a SR tablet Commonly known as: EXALGO Take 12 mg by mouth 2 (two) times daily.   hyoscyamine 0.125 MG SL tablet Commonly known as: LEVSIN SL TAKE 1 TABLET (0.125 MG TOTAL) BY MOUTH EVERY 4 (FOUR) HOURS AS NEEDED FOR CRAMPING.   ketoconazole 2 % shampoo Commonly known as: NIZORAL SHAMPOO WITH A SMALL AMOUNT AS DIRECTED THREE TIMES A WEEK SHAMPOO SCALP 3 DAYS A WEEK, LET SHAMPOO SIT FOR 10 MINUTES AND RINSE OFF   levothyroxine 150 MCG tablet Commonly known as: SYNTHROID Take 1 tablet (150 mcg total) by mouth daily.   LYSINE PO Take by mouth daily.   Magnesium 400 MG Caps Take by mouth daily.   metFORMIN 500 MG tablet Commonly known as: GLUCOPHAGE TAKE 1 TABLET BY MOUTH  TWICE DAILY WITH MEALS   metoprolol succinate 50 MG 24 hr tablet Commonly known as: TOPROL-XL TAKE 1 TABLET BY MOUTH  TWICE DAILY TAKE WITH OR  IMMEDIATELY FOLLOWING A  MEAL   ondansetron 4 MG tablet Commonly known as: ZOFRAN Take 1 tablet (4 mg total) by mouth every 8 (eight) hours as needed for nausea or vomiting.   oxyCODONE 10 MG 12 hr tablet Commonly known as: OXYCONTIN Take 10 mg by mouth 3 (three) times daily as needed. Beakthrough pain   predniSONE  10 MG tablet Commonly known as: DELTASONE Take 1 tablet (10 mg total) by mouth daily with breakfast.   ramipril 5 MG capsule Commonly known as: ALTACE TAKE 1 CAPSULE BY MOUTH  DAILY   rosuvastatin 5 MG tablet Commonly known as: CRESTOR TAKE 1 TABLET BY MOUTH  TWICE WEEKLY   torsemide 20 MG tablet Commonly known as: DEMADEX TAKE 1 TABLET BY MOUTH  DAILY AS NEEDED   TYLENOL ARTHRITIS PAIN PO Take 650 mg by mouth daily.        Allergies:  Allergies  Allergen Reactions   Ceftin [Cefuroxime] Swelling    Swelling, "legs turn blue".  Legs swelling, then blue, then a rash   Clonidine Derivatives     Swelling of lips   Erythromycin Swelling    Rash, swollen gums    Keflex [Cephalexin] Anaphylaxis    Chest was broken  out in a rash. Lips were swelling. Took a few days to occur, but it kept getting worse.                                                                                                                                                                                                   Penicillins Anaphylaxis    Pt had a daughter with an allergy to this at a young age. Patient developed blisters anywhere her child touched her. Also, if she used the bathroom after daughter did while on keflex, it would cause a reaction for her. Lips and roof of mouth swelled up also.   Sulfonamide Derivatives Swelling    Rash, swollen gums, lips   Ciprofloxacin     REACTION: ? rash vs sun rxn. Rash    Fluoxetine Hcl     REACTION: stomach problems. Severe pain in abdomen. Could not function d/t pain   Furosemide Swelling    REACTION: swelling worsened in legs once taking   Gabapentin Other (See Comments)    REACTION: edema of feet. Unable to get shoes on   Paroxetine     REACTION: weight gain (30 pound weight gain   Pregabalin     REACTION: swelling of feet and legs. Unable to get shoes on.   Tetracycline     REACTION:inflammed genitals.    Venlafaxine     REACTION: sweating  profusely   Etodolac     REACTION: reaction not known   Macrobid [Nitrofurantoin]     REACTION: ? Lung problem    Amitriptyline Hcl     REACTION: sedating   Atorvastatin     REACTION: muscle twitch and pain   Benicar [Olmesartan Medoxomil]     Muscle pain    Cephalexin Hives and Rash   Cetirizine Hcl     REACTION: headache   Diovan [Valsartan]     Thought it made her feel confused   Naproxen Sodium Other (See Comments)    REACTION: edema of feet and legs.  Not good for ckd    Family History: Family History  Problem Relation Age of Onset   Emphysema Father        + smoker   Lung cancer Mother        + smoker   Coronary artery disease Mother        relatively young   Lymphoma Sister    Heart disease Brother    Lymphoma Brother    Parkinson's disease Brother    Anemia Brother        aplastic    Diabetes Brother  Leukemia Son    Iron deficiency Daughter    Breast cancer Neg Hx     Social History:  reports that she quit smoking about 52 years ago. Her smoking use included cigarettes. She has a 5.00 pack-year smoking history. She has never used smokeless tobacco. She reports that she does not drink alcohol and does not use drugs.   Physical Exam: BP (!) 156/87   Pulse 73   Ht 5\' 3"  (1.6 m)   Wt 200 lb (90.7 kg)   BMI 35.43 kg/m   Constitutional:  Alert and oriented, No acute distress. HEENT: [place] AT, moist mucus membranes.  Trachea midline, no masses. Cardiovascular: No clubbing, cyanosis, or edema. Respiratory: Normal respiratory effort, no increased work of breathing. Psychiatric: Normal mood and affect.  Laboratory Data:  Urinalysis Voided UA showed 11-30 WBCs however there were >10 epis.  Catheterized urine was obtained which showed no significant epis and 6-10 WBC  Assessment & Plan:    1. Acute cystitis without hematuria Urine culture grew multidrug-resistant E. Coli Symptoms have resolved Cath urine today with mild pyuria and urine culture was  repeated Would hold off on treatment until urine culture returns.  Agree that fosfomycin would be best choice for antibiotic treatment of her MDR E. coli   Abbie Sons, MD  [facility] [address] [place], [place] [address] [phone]

## 2021-09-17 ENCOUNTER — Encounter: Payer: Self-pay | Admitting: Urology

## 2021-09-17 ENCOUNTER — Telehealth: Payer: Self-pay | Admitting: *Deleted

## 2021-09-17 NOTE — Telephone Encounter (Signed)
Notified patient as instructed, patient pleased °

## 2021-09-17 NOTE — Telephone Encounter (Signed)
-----   Message from Abbie Sons, MD sent at 09/17/2021  7:26 AM EST ----- Please let the patient know catheterized urine showed small amount of white blood cells.  Much improved from her previous urinalysis.  Would not recommend treatment until urine culture results are reviewed

## 2021-09-21 ENCOUNTER — Ambulatory Visit (INDEPENDENT_AMBULATORY_CARE_PROVIDER_SITE_OTHER): Payer: Medicare Other | Admitting: Vascular Surgery

## 2021-09-21 ENCOUNTER — Ambulatory Visit: Payer: Medicare Other | Admitting: Physician Assistant

## 2021-09-22 ENCOUNTER — Telehealth: Payer: Self-pay | Admitting: *Deleted

## 2021-09-22 LAB — CULTURE, URINE COMPREHENSIVE

## 2021-09-22 NOTE — Telephone Encounter (Signed)
Patient called regarding urine culture results. Called lab regarding results-would like a return call. Will call once reviewed by Dr. Bernardo Heater.

## 2021-09-22 NOTE — Telephone Encounter (Signed)
Patient r/s appointment to 10/06/21. She will start Actemra at this OV as long as labs are UTD and she has no signs/symptoms of an infection, is not taking antibiotics, and does not have upcoming surgery.  Knox Saliva, PharmD, MPH, BCPS Clinical Pharmacist (Rheumatology and Pulmonology)

## 2021-09-23 ENCOUNTER — Other Ambulatory Visit: Payer: Self-pay | Admitting: Family Medicine

## 2021-09-23 ENCOUNTER — Other Ambulatory Visit: Payer: Self-pay | Admitting: Urology

## 2021-09-23 MED ORDER — FOSFOMYCIN TROMETHAMINE 3 G PO PACK: 3.0000 g | PACK | Freq: Once | ORAL | 0 refills | Status: DC

## 2021-09-23 NOTE — Telephone Encounter (Signed)
Urine culture grew a low level of E. coli.  Based on her multiple antibiotic allergies/intolerances would recommend she go ahead and take the fosfomycin that was previously prescribed.

## 2021-09-23 NOTE — Addendum Note (Signed)
Addended by: Chrystie Nose on: 09/23/2021 09:18 AM   Modules accepted: Orders

## 2021-09-24 ENCOUNTER — Telehealth: Payer: Self-pay

## 2021-09-24 NOTE — Chronic Care Management (AMB) (Signed)
Chronic Care Management Pharmacy Assistant   Name: DONNICA JARNAGIN  MRN: [id number] DOB: Jun 01, 1951  Tina Berger is an 70 y.o. year old female who presents for his initial CCM visit with the clinical pharmacist.  Reason for Encounter: Initial Questions   Conditions to be addressed/monitored: CAD, HTN, HLD, and DMII  Recent office visits:  None in the past 6 months  Recent consult visits:  09/16/21-Urology-Patient presented for  cystitis. In & Out catheterization.Would hold off on treatment until urine culture returns.  Agree that fosfomycin would be best choice for antibiotic treatment of her MDR E. Coli 07/20/21-Vascular surgery-Patient presented for follow up AV malformation left leg. Continue unna boot therapy,follow up 1 week. 06/25/21-Vascular Surgery-Patient presented for follow up AV malformation left leg. Start Haematologist therapy .follow up 2 weeks, no medication changes 06/10/21-[facility]-Patient presented for follow up discussion of Orencia.Recommend annual influenza, Pneumovax 23, Prevnar 13, and Shingrix as indicated.  Will apply for Orencia through patient's insurance.  Reviewed storage information for Orencia.  Advised initial injection must be administered in office.  Patient dose will be Orencia 125 mg every 7 days.  06/02/21-Pulmonology-Patient presented for initial consult.-Given the fact overall she is stable with minimal symptoms, current approach is to closely monitor especially in the context that antifibrotic's have significant side effects that are mostly unpleasant GI symptoms.-Do repeat pulmonary function test spirometry and DLCO in 6 months  05/28/21-Vascular Surgery-Patient presented for venous insufficiency legs. Duplex ultrasound ordered.Behavioral modification was stressed; avoidance of cold and utilizing wool socks was reviewed again.No medication changes 05/21/21-Vascular Surgery-Patient presents for wound check left leg-weekly dressing changes, no  medication changes. 04/23/21-Vascular Surgery-Patient presented for wound check.Apply bacitracin twice daily. 04/22/21-Cardiology-Patient presented for follow up CAD and test results. EKG,no medication changes 04/06/21-Vascular Surgery-Patient presented for results of ultrasound,no medication changes  [facility]  [facility] Surgery-Patient presented for biopsy left skin tissue with removal of nodule.No admission-no medication changes  Medications: Outpatient Encounter Medications as of 09/24/2021  Medication Sig   Acetaminophen (TYLENOL ARTHRITIS PAIN PO) Take 650 mg by mouth daily.   buPROPion (WELLBUTRIN XL) 150 MG 24 hr tablet TAKE 1 TABLET BY MOUTH  DAILY   Chlorphen-Phenyleph-ASA (ALKA-SELTZER PLUS COLD PO) Take by mouth as needed.   cholecalciferol (VITAMIN D3) 25 MCG (1000 UNIT) tablet Take 1,000 Units by mouth daily.   cyclobenzaprine (FLEXERIL) 10 MG tablet TAKE 1/2 TO 1 TABLET BY  MOUTH ONCE DAILY AND 1  TABLET AT BEDTIME   Diclofenac Sodium 1.5 % SOLN Place 2 mLs onto the skin 4 (four) times daily.   DULoxetine (CYMBALTA) 60 MG capsule Take 1 capsule (60 mg total) by mouth daily.   esomeprazole (NEXIUM) 20 MG capsule TAKE 1 CAPSULE BY MOUTH  DAILY   famotidine (PEPCID) 40 MG tablet TAKE 1 TABLET BY MOUTH  DAILY   ferrous sulfate 325 (65 FE) MG EC tablet Take 325 mg by mouth daily with breakfast.   glucose blood test strip One Touch Ultra stripts blue-To check sugar once daily and as needed for DM2 250.00   GUAIFENESIN CR PO Take by mouth as needed.   HYDROmorphone HCl (EXALGO) 12 MG T24A SR tablet Take 12 mg by mouth 2 (two) times daily.   hyoscyamine (LEVSIN SL) 0.125 MG SL tablet TAKE 1 TABLET (0.125 MG TOTAL) BY MOUTH EVERY 4 (FOUR) HOURS AS NEEDED FOR CRAMPING.   ketoconazole (NIZORAL) 2 % shampoo SHAMPOO WITH A SMALL AMOUNT AS DIRECTED THREE TIMES A WEEK  SHAMPOO SCALP 3 DAYS A WEEK, LET SHAMPOO SIT FOR 10 MINUTES AND RINSE OFF    levothyroxine (SYNTHROID) 150 MCG tablet Take 1 tablet (150 mcg total) by mouth daily.   LYSINE PO Take by mouth daily.   Magnesium 400 MG CAPS Take by mouth daily.   metFORMIN (GLUCOPHAGE) 500 MG tablet TAKE 1 TABLET BY MOUTH  TWICE DAILY WITH MEALS   metoprolol succinate (TOPROL-XL) 50 MG 24 hr tablet TAKE 1 TABLET BY MOUTH  TWICE DAILY TAKE WITH OR  IMMEDIATELY FOLLOWING A  MEAL   ondansetron (ZOFRAN) 4 MG tablet Take 1 tablet (4 mg total) by mouth every 8 (eight) hours as needed for nausea or vomiting.   oxyCODONE (OXYCONTIN) 10 MG 12 hr tablet Take 10 mg by mouth 3 (three) times daily as needed. Beakthrough pain   predniSONE (DELTASONE) 10 MG tablet Take 1 tablet (10 mg total) by mouth daily with breakfast.   ramipril (ALTACE) 5 MG capsule TAKE 1 CAPSULE BY MOUTH  DAILY   rosuvastatin (CRESTOR) 5 MG tablet TAKE 1 TABLET BY MOUTH  TWICE WEEKLY   torsemide (DEMADEX) 20 MG tablet TAKE 1 TABLET BY MOUTH  DAILY AS NEEDED   No facility-administered encounter medications on file as of 09/24/2021.    Lab Results  Component Value Date/Time   HGBA1C 6.5 08/20/2020 09:51 AM   HGBA1C 6.5 08/17/2019 09:32 AM     BP Readings from Last 3 Encounters:  09/16/21 (!) 156/87  08/10/21 (!) 152/88  08/03/21 132/84    Patient contacted to review initial questions prior to visit with [name].  Have you seen any other providers since your last visit with PCP? Yes multiple visits with vascular surgery for follow up. Urology,Rheumatology  Any changes in your medications or health? No  Any side effects from any medications? Yes The patient states a rheumatology medication made her terribly nauseous and stopped it.   Do you have an symptoms or problems not managed by your medications? No  Any concerns about your health right now? No  Has your provider asked that you check blood pressure, blood sugar, or follow special diet at home? Yes The patient has low carb diet and she has monitors for her  BP and BG's  Do you get any type of exercise on a regular basis? Yes The patiet reports when she can she will do pilates and work out on Dietitian at home   Can you think of a goal you would like to reach for your health? No  Do you have any problems getting your medications? No   Is there anything that you would like to discuss during the appointment? No   Spoke with patient and reminded them to have all medications, supplements and any blood glucose and blood pressure readings available for review with pharmacist, at their telephone visit on 09/29/21 at 11:00am.    Star Rating Drugs:  Medication:  Last Fill: Day Supply Metformin 500mg  05/06/21 90 Rosuvastatin 5mg  04/14/21 90 Ramipril 5mg   04/14/21 90   Care Gaps: Annual wellness visit in last year? No Most Recent BP reading:156/87  73-P  09/16/21  If Diabetic: Most recent A1C reading:  6.5  08/20/2020 Last eye exam / retinopathy screening:08/2020 Last diabetic foot exam:08/2020  [name], CPP notified  Avel Sensor, [place] [phone]  Total time spent for month CPA: 40 min.

## 2021-09-25 ENCOUNTER — Encounter: Payer: Self-pay | Admitting: Urology

## 2021-09-28 ENCOUNTER — Other Ambulatory Visit: Payer: Self-pay

## 2021-09-28 ENCOUNTER — Ambulatory Visit: Payer: Medicare Other

## 2021-09-28 VITALS — BP 130/65

## 2021-09-28 DIAGNOSIS — I10 Essential (primary) hypertension: Secondary | ICD-10-CM

## 2021-09-28 DIAGNOSIS — E1169 Type 2 diabetes mellitus with other specified complication: Secondary | ICD-10-CM

## 2021-09-28 DIAGNOSIS — N3000 Acute cystitis without hematuria: Secondary | ICD-10-CM

## 2021-09-28 DIAGNOSIS — E119 Type 2 diabetes mellitus without complications: Secondary | ICD-10-CM

## 2021-09-28 NOTE — Progress Notes (Signed)
Chronic Care Management Pharmacy Note  09/28/2021 Name:  Tina Berger MRN:  [id number] DOB:  1950-11-04   Subjective: Tina Berger is an 70 y.o. year old female who is a primary patient of Tower, [name], MD.  The CCM team was consulted for assistance with disease management and care coordination needs.    Engaged with patient by telephone for initial visit in response to provider referral for pharmacy case management and/or care coordination services.   Consent to Services:  The patient was given the following information about Chronic Care Management services today, agreed to services, and gave verbal consent: 1. CCM service includes personalized support from designated clinical staff supervised by the primary care provider, including individualized plan of care and coordination with other care providers 2. 24/7 contact phone numbers for assistance for urgent and routine care needs. 3. Service will only be billed when office clinical staff spend 20 minutes or more in a month to coordinate care. 4. Only one practitioner may furnish and bill the service in a calendar month. 5.The patient may stop CCM services at any time (effective at the end of the month) by phone call to the office staff. 6. The patient will be responsible for cost sharing (co-pay) of up to 20% of the service fee (after annual deductible is met). Patient agreed to services and consent obtained.  Patient Care Team: Tower, [name], MD as PCP - General [name], [name], MD as Consulting Physician (Cardiology) [name], [facility] as Pharmacist (Pharmacist)  Recent office visits: 11/13/20 Glori Bickers, PCP - Arthralgia, labs completed for autoimmune joint pain. Use voltaren gel up to QID.  08/26/20 Glori Bickers, PCP - Routine medical visit  Recent consult visits: 09/16/21 - Urology-Patient presented for cystitis. In & Out catheterization.Would hold off on treatment until urine culture returns. Agree that fosfomycin would be best  choice for antibiotic treatment of her MDR E. Coli 07/20/21 - Vascular surgery-Patient presented for follow up AV malformation left leg. Continue unna boot therapy,follow up 1 week. 06/10/21 - [facility]-Patient presented for follow up discussion of Orencia. Start Orencia once every 7 days. 06/02/21 - Pulmonology-Patient presented for initial consult.-Given the fact overall she is stable with minimal symptoms, current approach is to closely monitor especially in the context that antifibrotic's have significant side effects that are mostly unpleasant GI symptoms. Repeat pulmonary function test spirometry and DLCO in 6 months  05/28/21 - Vascular Surgery-Patient presented for venous insufficiency legs. Duplex ultrasound ordered.Behavioral modification was stressed; avoidance of cold and utilizing wool socks was reviewed again.No medication changes 04/22/21 - Cardiology-Patient presented for follow up CAD and test results. EKG,no medication changes  Hospital visits: None in previous 6 months  Objective:  Lab Results  Component Value Date   CREATININE 0.78 08/26/2021   BUN 11 08/26/2021   GFR 48.18 (L) 11/13/2020   GFRNONAA >60 04/07/2021   GFRAA 75 03/24/2021   NA 136 08/26/2021   K 5.2 08/26/2021   CALCIUM 9.4 08/26/2021   CO2 26 08/26/2021   GLUCOSE 176 (H) 08/26/2021   Lab Results  Component Value Date/Time   HGBA1C 6.5 08/20/2020 09:51 AM   HGBA1C 6.5 08/17/2019 09:32 AM   GFR 48.18 (L) 11/13/2020 08:54 AM   GFR 63.03 09/25/2020 09:34 AM    Last diabetic Eye exam:  Lab Results  Component Value Date/Time   HMDIABEYEEXA No Retinopathy 09/15/2020 12:00 AM    Last diabetic Foot exam: 08/20/2020  Lab Results  Component Value Date  CHOL 168 08/20/2020   HDL 42.30 08/20/2020   LDLCALC 99 08/20/2020   LDLDIRECT 149.0 07/26/2016   TRIG 133.0 08/20/2020   CHOLHDL 4 08/20/2020    Hepatic Function Latest Ref Rng & Units 08/26/2021 03/24/2021 02/20/2021  Total Protein 6.0 - 8.5 g/dL  7.1 7.4 7.3  Albumin 3.7 - 4.7 g/dL 4.6 - -  AST 0 - 40 IU/L 12 20 27   ALT 0 - 32 IU/L 13 20 37(H)  Alk Phosphatase 44 - 121 IU/L 82 - -  Total Bilirubin 0.0 - 1.2 mg/dL 0.2 0.3 0.4  Bilirubin, Direct 0.0 - 0.3 mg/dL - - -    Lab Results  Component Value Date/Time   TSH 0.36 08/20/2020 09:51 AM   TSH 0.57 08/17/2019 09:32 AM   FREET4 1.1 01/24/2007 09:41 AM    CBC Latest Ref Rng & Units 08/26/2021 04/07/2021 03/24/2021  WBC 3.4 - 10.8 x10E3/uL 9.2 8.9 11.0(H)  Hemoglobin 11.1 - 15.9 g/dL 12.6 11.1(L) 13.1  Hematocrit 34.0 - 46.6 % 38.5 34.5(L) 40.4  Platelets 150 - 450 x10E3/uL 359 346 505(H)    Lab Results  Component Value Date/Time   VD25OH 48.52 08/20/2020 09:51 AM   VD25OH 50.96 08/17/2019 09:32 AM    Clinical ASCVD: Yes  The 10-year ASCVD risk score (Arnett DK, et al., 2019) is: 38.2%   Values used to calculate the score:     Age: 89 years     Sex: Female     Is Non-Hispanic African American: No     Diabetic: Yes     Tobacco smoker: Yes     Systolic Blood Pressure: 800 mmHg     Is BP treated: Yes     HDL Cholesterol: 42.3 mg/dL     Total Cholesterol: 168 mg/dL    Depression screen [facility] 2/9 08/20/2020 08/16/2019 08/14/2018  Decreased Interest 0 0 0  Down, Depressed, Hopeless 0 0 0  PHQ - 2 Score 0 0 0  Altered sleeping 0 0 0  Tired, decreased energy 0 0 0  Change in appetite 0 0 0  Feeling bad or failure about yourself  0 0 0  Trouble concentrating 0 0 0  Moving slowly or fidgety/restless 0 0 0  Suicidal thoughts 0 0 0  PHQ-9 Score 0 0 0  Difficult doing work/chores Not difficult at all Not difficult at all Not difficult at all  Some recent data might be hidden    Social History   Tobacco Use  Smoking Status Former   Packs/day: 1.00   Years: 5.00   Pack years: 5.00   Types: Cigarettes   Quit date: 10/18/1968   Years since quitting: 52.9  Smokeless Tobacco Never  Tobacco Comments   quit over 40 years   BP Readings from Last 3 Encounters:  09/28/21  130/65  09/16/21 (!) 156/87  08/10/21 (!) 152/88   Pulse Readings from Last 3 Encounters:  09/16/21 73  08/10/21 73  08/03/21 80   Wt Readings from Last 3 Encounters:  09/16/21 200 lb (90.7 kg)  07/27/21 204 lb (92.5 kg)  07/20/21 202 lb (91.6 kg)   BMI Readings from Last 3 Encounters:  09/16/21 35.43 kg/m  07/27/21 37.31 kg/m  07/20/21 36.95 kg/m    Assessment/Interventions: Review of patient past medical history, allergies, medications, health status, including review of consultants reports, laboratory and other test data, was performed as part of comprehensive evaluation and provision of chronic care management services.   SDOH:  (Social Determinants of Health) assessments  and interventions performed: Yes SDOH Interventions    Flowsheet Row Most Recent Value  SDOH Interventions   Financial Strain Interventions Intervention Not Indicated      SDOH Screenings   Alcohol Screen: Not on file  Depression (PHQ2-9): Not on file  Financial Resource Strain: Low Risk    Difficulty of Paying Living Expenses: Not very hard  Food Insecurity: Not on file  Housing: Not on file  Physical Activity: Not on file  Social Connections: Not on file  Stress: Not on file  Tobacco Use: Medium Risk   Smoking Tobacco Use: Former   Smokeless Tobacco Use: Never   Passive Exposure: Not on file  Transportation Needs: Not on file    CCM Care Plan  Allergies  Allergen Reactions   Ceftin [Cefuroxime] Swelling    Swelling, "legs turn blue".  Legs swelling, then blue, then a rash   Clonidine Derivatives     Swelling of lips   Erythromycin Swelling    Rash, swollen gums    Keflex [Cephalexin] Anaphylaxis    Chest was broken out in a rash. Lips were swelling. Took a few days to occur, but it kept getting worse.                                                                                                                                                                                                    Penicillins Anaphylaxis    Pt had a daughter with an allergy to this at a young age. Patient developed blisters anywhere her child touched her. Also, if she used the bathroom after daughter did while on keflex, it would cause a reaction for her. Lips and roof of mouth swelled up also.   Sulfonamide Derivatives Swelling    Rash, swollen gums, lips   Ciprofloxacin     REACTION: ? rash vs sun rxn. Rash    Fluoxetine Hcl     REACTION: stomach problems. Severe pain in abdomen. Could not function d/t pain   Furosemide Swelling    REACTION: swelling worsened in legs once taking   Gabapentin Other (See Comments)    REACTION: edema of feet. Unable to get shoes on   Paroxetine     REACTION: weight gain (30 pound weight gain   Pregabalin     REACTION: swelling of feet and legs. Unable to get shoes on.   Tetracycline     REACTION:inflammed genitals.    Venlafaxine     REACTION: sweating profusely   Etodolac     REACTION: reaction not known   Macrobid [Nitrofurantoin]  REACTION: ? Lung problem    Amitriptyline Hcl     REACTION: sedating   Atorvastatin     REACTION: muscle twitch and pain   Benicar [Olmesartan Medoxomil]     Muscle pain    Cephalexin Hives and Rash   Cetirizine Hcl     REACTION: headache   Diovan [Valsartan]     Thought it made her feel confused   Naproxen Sodium Other (See Comments)    REACTION: edema of feet and legs.  Not good for ckd    Medications Reviewed Today     Reviewed by [name], [facility] (Pharmacist) on 09/28/21 at [place] List Status: <None>   Medication Order Taking? Sig Documenting Provider Last Dose Status Informant  Acetaminophen (TYLENOL ARTHRITIS PAIN PO) [id number] Yes Take 650 mg by mouth daily. [provider] Taking Active   buPROPion (WELLBUTRIN XL) 150 MG 24 hr tablet [id number] Yes TAKE 1 TABLET BY MOUTH  DAILY Tower, [name], MD Taking Active   Chlorphen-Phenyleph-ASA (ALKA-SELTZER PLUS COLD PO) [id number] Yes  Take by mouth as needed. Just for colds [provider] Taking Active Self  cholecalciferol (VITAMIN D3) 25 MCG (1000 UNIT) tablet [id number] Yes Take 1,000 Units by mouth daily. [provider] Taking Active Self  cyclobenzaprine (FLEXERIL) 10 MG tablet [id number] No TAKE 1/2 TO 1 TABLET BY  MOUTH ONCE DAILY AND 1  TABLET AT BEDTIME  Patient not taking: Reported on 09/28/2021   Abner Greenspan, MD Not Taking Consider Medication Status and Discontinue (Patient Preference)   Diclofenac Sodium 1.5 % SOLN [id number] Yes Place 2 mLs onto the skin 4 (four) times daily. Tower, [name], MD Taking Active   DULoxetine (CYMBALTA) 60 MG capsule [id number] Yes Take 1 capsule (60 mg total) by mouth daily. Tower, [name], MD Taking Active   esomeprazole (NEXIUM) 20 MG capsule [id number] Yes TAKE 1 CAPSULE BY MOUTH  DAILY Tower, [name], MD Taking Active   famotidine (PEPCID) 40 MG tablet [id number] Yes TAKE 1 TABLET BY MOUTH  DAILY Tower, [name], MD Taking Active   ferrous sulfate 325 (65 FE) MG EC tablet [id number] Yes Take 325 mg by mouth daily with breakfast. [provider] Taking Active   glucose blood test strip [id number] Yes One Touch Ultra stripts blue-To check sugar once daily and as needed for DM2 250.00 Tower, [name], MD Taking Active   GUAIFENESIN CR PO [id number]  Take by mouth as needed. [provider]  Active   HYDROmorphone HCl (EXALGO) 12 MG T24A SR tablet [id number] Yes Take 12 mg by mouth 2 (two) times daily. [provider] Taking Active Self  hyoscyamine (LEVSIN SL) 0.125 MG SL tablet [id number] Yes TAKE 1 TABLET (0.125 MG TOTAL) BY MOUTH EVERY 4 (FOUR) HOURS AS NEEDED FOR CRAMPING. Tower, [name], MD Taking Active   ketoconazole (NIZORAL) 2 % shampoo [id number] Yes SHAMPOO WITH A SMALL AMOUNT AS DIRECTED THREE TIMES A WEEK SHAMPOO SCALP 3 DAYS A WEEK, LET SHAMPOO SIT FOR 10 MINUTES AND RINSE OFF Moye, [place], MD Taking Active   levothyroxine (SYNTHROID) 150  MCG tablet [id number] Yes Take 1 tablet (150 mcg total) by mouth daily. Tower, [name], MD Taking Active   LYSINE PO [id number] Yes Take by mouth daily. OTC for ulcer prophylaxis [provider] Taking Active   Magnesium 400 MG CAPS [id number] Yes Take by mouth daily. [provider] Taking Active   metFORMIN (GLUCOPHAGE) 500 MG tablet [id number] Yes TAKE 1 TABLET  BY MOUTH  TWICE DAILY WITH MEALS Tower, [name], MD Taking Active   metoprolol succinate (TOPROL-XL) 50 MG 24 hr tablet [id number] Yes TAKE 1 TABLET BY MOUTH  TWICE DAILY TAKE WITH OR  IMMEDIATELY FOLLOWING A  MEAL Tower, [name], MD Taking Active   ondansetron (ZOFRAN) 4 MG tablet [id number] Yes Take 1 tablet (4 mg total) by mouth every 8 (eight) hours as needed for nausea or vomiting. Ofilia Neas, PA-C Taking Active   oxyCODONE (OXYCONTIN) 10 MG 12 hr tablet [id number] Yes Take 10 mg by mouth 3 (three) times daily as needed. Beakthrough pain [provider] Taking Active Self  predniSONE (DELTASONE) 10 MG tablet [id number] Yes Take 1 tablet (10 mg total) by mouth daily with breakfast. Ofilia Neas, PA-C Taking Active   ramipril (ALTACE) 5 MG capsule [id number] Yes TAKE 1 CAPSULE BY MOUTH  DAILY Tower, [name], MD Taking Active   rosuvastatin (CRESTOR) 5 MG tablet [id number] Yes TAKE 1 TABLET BY MOUTH  TWICE WEEKLY Tower, [name], MD Taking Active   torsemide (DEMADEX) 20 MG tablet [id number] Yes TAKE 1 TABLET BY MOUTH  DAILY AS NEEDED Tower, [name], MD Taking Active Self            Patient Active Problem List   Diagnosis Date Noted   Dysuria 09/07/2021   Rheumatoid arthritis ([place]) 05/27/2021   Chronic venous insufficiency 05/23/2021   AVM (arteriovenous malformation) 04/04/2021   CAD (coronary artery disease) 01/22/2021   Primary osteoarthritis of both knees 01/12/2021   Positive ANA (antinuclear antibody) 11/17/2020   Joint pain 11/13/2020   Elevated serum creatinine 08/26/2020   Current use of proton  pump inhibitor 08/26/2020   Estrogen deficiency 08/21/2018   TMJ (dislocation of temporomandibular joint), initial encounter 05/05/2018   Anterior neck pain 05/05/2018   Chronic pain syndrome 11/09/2017   Chronic upper extremity pain (Tertiary Area of Pain) (Bilateral) (L>R) 11/09/2017   Fibromyalgia syndrome 11/09/2017   Osteoarthritis 11/09/2017   Osteoarthritis of lumbar facet joint (Bilateral) 11/09/2017   Lumbar facet arthropathy (Bilateral) 11/09/2017   Lumbar facet syndrome (Bilateral) (L>R) 11/09/2017   Lumbar foraminal stenosis (multilevel) (Bilateral) 11/09/2017   DDD (degenerative disc disease), lumbar 11/09/2017   Thoracic levoscoliosis 11/09/2017   Thoracic facet syndrome (Bilateral) (L>R) 11/09/2017   Thoracic facet arthropathy (Bilateral) (R>L) 11/09/2017   DDD (degenerative disc disease), thoracic 11/09/2017   Thoracolumbar IVDD 11/09/2017   DDD (degenerative disc disease), cervical 11/09/2017   Osteoarthritis of cervical facet (Bilateral) 11/09/2017   Grade 1 Anterolisthesis of C7 over T1 11/09/2017   Cervical foraminal stenosis (Bilateral) 11/09/2017   History of fusion of cervical spine (C5-6 ACDF) 11/09/2017   Cervical facet syndrome (Bilateral) 11/09/2017   Disorder of skeletal system 11/09/2017   Cervical radiculitis (Bilateral) 11/09/2017   Lumbar Epidural lipomatosis 11/09/2017   Chronic sacroiliac joint pain (Bilateral) (L>R) 11/09/2017   Chronic hip pain (Bilateral) (L>R) 11/09/2017   Chronic upper back pain (Primary Area of Pain) (midline) 10/24/2017   Chronic neck pain (Secondary Area of Pain) (Bilateral)  (L>R) 10/24/2017   Chronic low back pain (Fourth Area of Pain) (Bilateral) (L>R) 10/24/2017   Chronic lower extremity pain (Fifth Area of Pain) (Bilateral) (L>R) 10/24/2017   Lumbar Grade 1 Retrolisthesis of L1-2 and L2-3 10/24/2017   Other long term (current) drug therapy 10/24/2017   Other specified health status 10/24/2017   Long term current use  of opiate analgesic 10/24/2017   Long term prescription opiate use 10/24/2017  Opiate use 10/24/2017   DM type 2 (diabetes mellitus, type 2) ([place]) 06/14/2014   Pharmacologic therapy 06/14/2014   Pedal edema    Post-menopausal 06/23/2012   Lumbar disc disease with radiculopathy 11/18/2011   HTN (hypertension) 07/30/2011   Raynaud disease 07/30/2011   Obesity 07/30/2011   Routine general medical examination at a health care facility 03/28/2011   Palpitations 04/27/2010   UTI (urinary tract infection) 03/18/2010   Problems influencing health status 12/11/2009   Depression with anxiety 06/14/2008   Vitamin D deficiency 05/13/2008   ANXIETY 03/25/2008   Osteopenia 03/25/2008   Hypothyroidism 03/28/2007   Hyperlipidemia associated with type 2 diabetes mellitus ([place]) 03/28/2007   Other iron deficiency anemias 03/28/2007   PANIC ATTACK 03/28/2007   KERATOCONJUNCTIVITIS SICCA 03/28/2007   Mitral valve disorder 03/28/2007   ABNORMAL HEART RHYTHMS 03/28/2007   Raynaud's syndrome 03/28/2007   GERD 03/28/2007   IBS 03/28/2007   Rosacea 03/28/2007   PLANTAR FASCIITIS 03/28/2007   MIGRAINES, HX OF 03/28/2007    Immunization History  Administered Date(s) Administered   Fluad Quad(high Dose 65+) 08/07/2020   Influenza Split 06/19/2011   Influenza Whole 08/01/2009   Influenza, High Dose Seasonal PF 08/14/2018, 07/03/2019   Influenza,inj,Quad PF,6+ Mos 06/14/2014, 08/25/2015, 07/26/2016, 08/19/2017   Moderna Sars-Covid-2 Vaccination 09/17/2020   PFIZER(Purple Top)SARS-COV-2 Vaccination 11/27/2019, 12/18/2019   Pneumococcal Conjugate-13 08/25/2015   Pneumococcal Polysaccharide-23 08/25/2016   Td 11/25/2003   Tdap 06/14/2014   Zoster, Live 09/16/2014    Conditions to be addressed/monitored:  Hypertension, Hyperlipidemia, Diabetes, and Depression  Care Plan : [place]  Updates made by [name], [place] since 09/28/2021 12:00 AM     Problem: CHL AMB  "PATIENT-SPECIFIC PROBLEM"      Long-Range Goal: Disease Management   Start Date: 09/28/2021  Priority: High  Note:   Current Barriers:  None identified  Pharmacist Clinical Goal(s):  Patient will contact provider office for questions/concerns as evidenced notation of same in electronic health record through collaboration with PharmD and provider.   Interventions: 1:1 collaboration with Tower, [name], MD regarding development and update of comprehensive plan of care as evidenced by provider attestation and co-signature Inter-disciplinary care team collaboration (see longitudinal plan of care) Comprehensive medication review performed; medication list updated in electronic medical record  Hypertension (BP goal <140/90) -Controlled - per home readings -History of palpitations, CAD, bilateral edema followed by cardiology  -Current treatment: Metoprolol succinate 50 mg - 1 tablet twice daily Ramipril 5 mg - 1 tablet daily Torsemide 20 mg - 1 tablet daily PRN edema  -Medications previously tried: none reported  -Current home readings: reports recent visit with urology was stressful causing elevated BP at clinic. She checks once weekly at home, BP ranging 120-130/60s (SBP never above 140 at home) -Denies hypotensive/hypertensive symptoms; No falls 3-4 years. -Educated on BP goals and benefits of medications for prevention of heart attack, stroke and kidney damage; -Counseled to monitor BP at home weekly, document, and provide log at future appointments -Recommended to continue current medication  Hyperlipidemia: (LDL goal < 70/CAD) -Not ideally controlled - LDL 99 (11/21), due for re-eval -Pt reports prior intolerance to multiple statins. Rosuvastatin 5 mg twice weekly is the first one she has tolerated. She notes significant improvement in cholesterol (decrease by 100) on low dose Crestor. We did not discuss additional agents/goals today due to time limitation (30 min appt slot due to  pt rescheduling in MyChart). She affirms adherence to twice weekly dosing. -Current treatment: Rosuvastatin  5 mg - 1 tablet twice weekly -Medications previously tried: atorvastatin -Educated on Cholesterol goals;  -Recommended to continue current medication  Diabetes (A1c goal <7%) -Controlled - A1c 6.5% -Current medications: Metformin 500 mg - 1 tablet twice daily with meals -Medications previously tried: none  -Current home glucose readings - she inquired about CGM, discussed insurance coverage requirements/OOP cost. She states it would be unaffordable. She has agreed with Dr. Glori Bickers to check home BG three days a week, at various times of day (1 BF, 1 lunch, 1 evening); none to report today. Discussed monitoring prior to PCP visit. Discussed goals for fasting versus post prandial.  -Denies hypoglycemic/hyperglycemic symptoms -Current meal patterns: 3 meals/day dinner: tries to eat more chicken than red meats, minimal vegetables  snacks: protein shake or apple, peanut butter, carrots and celery drinks: primarily water, occasional diet mountain dew; 2 cups coffee/day -Current exercise: walks at big box stores with grocery cart for stability, 30 minutes or so, 3 times/week unless feeling bad -Educated on A1c and blood sugar goals; -Counseled to check feet daily and get yearly eye exams - she is aware eye and foot exam due; PCP visit scheduled for foot exam. Eye exam was rescheduled to May 2023 due to pt illness. -Recommended to continue current medication; Complete eye and foot exam annually.  Depression/Anxiety (Goal: Improve mood) -Controlled - per patient report -Current treatment: Bupropion 150 mg 24 hr - 1 tablet daily (evening) Duloxetine 60 mg - 1 capsule daily (AM - fibromyalgia) -Medications previously tried/failed: reports 'quite a few' failed therapies -PHQ9: 0 - 08/20/20 -GAD7: reports ongoing anxiety, situational stress, no GAD completed -She does not see a counselor  currently. She has worked through things in the past. She would go if needed but feels good right now. -Educated on Benefits of medication for symptom control Benefits of cognitive-behavioral therapy with or without medication -Recommended to continue current medication  Rheumatoid Arthritis (Goal: Improve symptoms) -Followed by rheumatology  -Pt reports she will be able to transition off prednisone soon. -Current treatment  Prednisone 10 mg - 1 tablet daily -Medications previously tried: none  -Recommended to continue current medication  GERD (Goal: Improve symptoms, TSH T4 WNL) -Controlled -Current treatment  Levothyroxine 150 mcg - 1 tablet daily *pt separates dose and takes on empty stomach* -Medications previously tried: none  -Recommended to continue current medication  GERD (Goal: Improve symptoms) -Controlled, pt reports severe symptoms with missed dose of esomeprazole. She takes it daily in morning and H2 at night. -Current treatment  Esomeprazole 20 mg - 1 capsule every morning Famotidine 40 mg - 1 tablet daily at bedtime -Medications previously tried: none  -Recommended to continue current medication  Other -  Ferrous sulfate 325 mg - 1 tablet daily with vitamin C Oxycodone 10 mg - TID PRN (followed by pain clinic) Hydromorphone 12 mg - 1 tablet BID (followed by pain clinic) Hyoscyamine 0.125 mg SL - 1 PRN cramping Ondansetron 4 mg - 1 q8h PRN n/v (rare use)  Patient Goals/Self-Care Activities Patient will:  - take medications as prescribed as evidenced by patient report and record review check glucose once weekly, document, and provide at future appointments check blood pressure weekly, document, and provide at future appointments  Follow Up Plan: Telephone follow up appointment with care management team member scheduled for: - CPP visit in 6 months (transition to Charlene Brooke) - CCM team call Cholesterol Adherence in 3 months       Medication  Assistance: None required.  Patient  affirms current coverage meets needs.  Compliance/Adherence/Medication fill history: Care Gaps: Mammogram - pt aware, she will schedule A1c - due, pcp appt scheduled Foot Exam - due, pcp appt scheduled Eye Exam - scheduled for May 2023 Cologuard  - pt aware, she will discuss with PCP   [facility]: Medication:                Last Fill:         Day Supply Metformin 516m        05/06/21            90 Rosuvastatin 577m      04/14/21            90 Ramipril 81m29m             04/14/21            90  Refill history not provided by Mail Order pharmacy, pt affirms adherence  [facility] [place] [id number]: [phone]: [phone] pill box? Yes - she keeps on box for morning and one for night Pt endorses 100% compliance She gets most meds from mail order.  We discussed: Benefits of medication synchronization, packaging and delivery as well as enhanced pharmacist oversight with [employer]. Patient decided to: Continue current medication management strategy  Care Plan and Follow Up Patient Decision:  Patient agrees to Care Plan and Follow-up.  MicDebbora DusharmD Clinical Pharmacist Practitioner [phone]

## 2021-09-28 NOTE — Patient Instructions (Addendum)
September 28, 2021  Dear Tina Berger,  It was a pleasure meeting you during our initial appointment on September 28, 2021. Below is a summary of the goals we discussed and components of chronic care management. Please contact me anytime with questions or concerns.   Visit Information  Patient Care Plan: CCM Pharmacy Care Plan     Problem Identified: CHL AMB "PATIENT-SPECIFIC PROBLEM"      Long-Range Goal: Disease Management   Start Date: 09/28/2021  Priority: High  Note:   Current Barriers:  None identified  Pharmacist Clinical Goal(s):  Patient will contact provider office for questions/concerns as evidenced notation of same in electronic health record through collaboration with PharmD and provider.   Interventions: 1:1 collaboration with Tower, [name], MD regarding development and update of comprehensive plan of care as evidenced by provider attestation and co-signature Inter-disciplinary care team collaboration (see longitudinal plan of care) Comprehensive medication review performed; medication list updated in electronic medical record  Hypertension (BP goal <140/90) -Controlled - per home readings -History of palpitations, CAD, bilateral edema followed by cardiology  -Current treatment: Metoprolol succinate 50 mg - 1 tablet twice daily Ramipril 5 mg - 1 tablet daily Torsemide 20 mg - 1 tablet daily PRN edema  -Medications previously tried: none reported  -Current home readings: reports recent visit with urology was stressful causing elevated BP at clinic. She checks once weekly at home, BP ranging 120-130/60s (SBP never above 140 at home) -Denies hypotensive/hypertensive symptoms; No falls 3-4 years. -Educated on BP goals and benefits of medications for prevention of heart attack, stroke and kidney damage; -Counseled to monitor BP at home weekly, document, and provide log at future appointments -Recommended to continue current medication  Hyperlipidemia: (LDL goal <  70/CAD) -Not ideally controlled - LDL 99 (11/21), due for re-eval -Pt reports prior intolerance to multiple statins. Rosuvastatin 5 mg twice weekly is the first one she has tolerated. She notes significant improvement in cholesterol (decrease by 100) on low dose Crestor. We did not discuss additional agents/goals today due to time limitation (30 min appt slot due to pt rescheduling in MyChart). She affirms adherence to twice weekly dosing. -Current treatment: Rosuvastatin 5 mg - 1 tablet twice weekly -Medications previously tried: atorvastatin -Educated on Cholesterol goals;  -Recommended to continue current medication  Diabetes (A1c goal <7%) -Controlled - A1c 6.5% -Current medications: Metformin 500 mg - 1 tablet twice daily with meals -Medications previously tried: none  -Current home glucose readings - she inquired about CGM, discussed insurance coverage requirements/OOP cost. She states it would be unaffordable. She has agreed with Dr. Glori Bickers to check home BG three days a week, at various times of day (1 BF, 1 lunch, 1 evening); none to report today. Discussed monitoring prior to PCP visit. Discussed goals for fasting versus post prandial.  -Denies hypoglycemic/hyperglycemic symptoms -Current meal patterns: 3 meals/day dinner: tries to eat more chicken than red meats, minimal vegetables  snacks: protein shake or apple, peanut butter, carrots and celery drinks: primarily water, occasional diet mountain dew; 2 cups coffee/day -Current exercise: walks at big box stores with grocery cart for stability, 30 minutes or so, 3 times/week unless feeling bad -Educated on A1c and blood sugar goals; -Counseled to check feet daily and get yearly eye exams - she is aware eye and foot exam due; PCP visit scheduled for foot exam. Eye exam was rescheduled to May 2023 due to pt illness. -Recommended to continue current medication; Complete eye and foot exam annually.  Depression/Anxiety (Goal: Improve  mood) -Controlled - per patient report -Current treatment: Bupropion 150 mg 24 hr - 1 tablet daily (evening) Duloxetine 60 mg - 1 capsule daily (AM - fibromyalgia) -Medications previously tried/failed: reports 'quite a few' failed therapies -PHQ9: 0 - 08/20/20 -GAD7: reports ongoing anxiety, situational stress, no GAD completed -She does not see a counselor currently. She has worked through things in the past. She would go if needed but feels good right now. -Educated on Benefits of medication for symptom control Benefits of cognitive-behavioral therapy with or without medication -Recommended to continue current medication  Rheumatoid Arthritis (Goal: Improve symptoms) -Followed by rheumatology  -Pt reports she will be able to transition off prednisone soon. -Current treatment  Prednisone 10 mg - 1 tablet daily -Medications previously tried: none  -Recommended to continue current medication  GERD (Goal: Improve symptoms, TSH T4 WNL) -Controlled -Current treatment  Levothyroxine 150 mcg - 1 tablet daily *pt separates dose and takes on empty stomach* -Medications previously tried: none  -Recommended to continue current medication  GERD (Goal: Improve symptoms) -Controlled, pt reports severe symptoms with missed dose of esomeprazole. She takes it daily in morning and H2 at night. -Current treatment  Esomeprazole 20 mg - 1 capsule every morning Famotidine 40 mg - 1 tablet daily at bedtime -Medications previously tried: none  -Recommended to continue current medication  Other -  Ferrous sulfate 325 mg - 1 tablet daily with vitamin C Oxycodone 10 mg - TID PRN (followed by pain clinic) Hydromorphone 12 mg - 1 tablet BID (followed by pain clinic) Hyoscyamine 0.125 mg SL - 1 PRN cramping Ondansetron 4 mg - 1 q8h PRN n/v (rare use)  Patient Goals/Self-Care Activities Patient will:  - take medications as prescribed as evidenced by patient report and record review check glucose once  weekly, document, and provide at future appointments check blood pressure weekly, document, and provide at future appointments  Follow Up Plan: Telephone follow up appointment with care management team member scheduled for: - CPP visit in 6 months (transition to [facility] Brooke) - CCM team call Cholesterol Adherence in 3 months      Tina Berger was given information about Chronic Care Management services today including:  CCM service includes personalized support from designated clinical staff supervised by her physician, including individualized plan of care and coordination with other care providers 24/7 contact phone numbers for assistance for urgent and routine care needs. Standard insurance, coinsurance, copays and deductibles apply for chronic care management only during months in which we provide at least 20 minutes of these services. Most insurances cover these services at 100%, however patients may be responsible for any copay, coinsurance and/or deductible if applicable. This service may help you avoid the need for more expensive face-to-face services. Only one practitioner may furnish and bill the service in a calendar month. The patient may stop CCM services at any time (effective at the end of the month) by phone call to the office staff.  Patient agreed to services and verbal consent obtained.   Patient verbalizes understanding of instructions provided today and agrees to view in [place].   [name], PharmD Clinical Pharmacist Practitioner [facility] at [facility] [phone]

## 2021-09-29 ENCOUNTER — Other Ambulatory Visit: Payer: Medicare Other

## 2021-09-29 ENCOUNTER — Telehealth: Payer: Medicare Other

## 2021-09-29 ENCOUNTER — Other Ambulatory Visit: Payer: Self-pay

## 2021-09-29 ENCOUNTER — Other Ambulatory Visit: Payer: Self-pay | Admitting: Physician Assistant

## 2021-09-29 DIAGNOSIS — R300 Dysuria: Secondary | ICD-10-CM

## 2021-09-29 DIAGNOSIS — R3915 Urgency of urination: Secondary | ICD-10-CM

## 2021-09-29 DIAGNOSIS — N3000 Acute cystitis without hematuria: Secondary | ICD-10-CM

## 2021-09-29 LAB — URINALYSIS, COMPLETE
Bilirubin, UA: NEGATIVE
Glucose, UA: NEGATIVE
Leukocytes,UA: NEGATIVE
Nitrite, UA: NEGATIVE
RBC, UA: NEGATIVE
Specific Gravity, UA: 1.025 (ref 1.005–1.030)
Urobilinogen, Ur: 0.2 mg/dL (ref 0.2–1.0)
pH, UA: 5.5 (ref 5.0–7.5)

## 2021-09-29 LAB — MICROSCOPIC EXAMINATION
Bacteria, UA: NONE SEEN
Epithelial Cells (non renal): >10 /hpf — ABNORMAL HIGH (ref 0–10)

## 2021-09-29 NOTE — Telephone Encounter (Signed)
Next Visit: 10/06/2021  Last Visit: 06/10/2021  Last Fill: 08/31/2021  Dx: Rheumatoid arthritis of multiple sites with negative rheumatoid factor   Current Dose per phone note on 08/31/2021: Prednisone 10 mg daily Ok to continue current dose of prednisone until her follow up visit. Ok to pend refill. We will start to slowly taper prednisone once actemra starts working.  Okay to refill Prednisone?

## 2021-10-04 LAB — CULTURE, URINE COMPREHENSIVE

## 2021-10-05 ENCOUNTER — Ambulatory Visit (INDEPENDENT_AMBULATORY_CARE_PROVIDER_SITE_OTHER): Payer: Medicare Other | Admitting: Vascular Surgery

## 2021-10-05 ENCOUNTER — Encounter (INDEPENDENT_AMBULATORY_CARE_PROVIDER_SITE_OTHER): Payer: Self-pay

## 2021-10-05 ENCOUNTER — Encounter: Payer: Self-pay | Admitting: *Deleted

## 2021-10-05 NOTE — Progress Notes (Signed)
Office Visit Note  Patient: Tina Berger             Date of Birth: [birth year]           MRN: [id number]             PCP: Abner Greenspan, MD Referring: Tower, [name], MD Visit Date: 10/06/2021 Occupation: @GUAROCC @  Subjective:  New start actemra visit   History of Present Illness: Tina Berger is a 70 y.o. female with history of seronegative rheumatoid arthritis, ILD, osteoarthritis, and fibromyalgia. She presents today to discuss starting on actemra.  She remains on prednisone 10 mg daily which has been managing her symptoms fairly well.  She continues to have chronic pain and inflammation in both hands.  She is also been having some increased discomfort in her C-spine.  She continues to follow-up with pain management but is planning on getting a second opinion for her neck since her symptoms have been worsening.  She has completed the course of antibiotics for her UTI.  She is not exhibiting any signs or symptoms of an infection at this time.  She is ready to proceed with Actemra and would like to have the first injection today in the office.  Activities of Daily Living:  Patient reports morning stiffness for sevveral hours.   Patient Reports nocturnal pain.  Difficulty dressing/grooming: Reports Difficulty climbing stairs: Denies Difficulty getting out of chair: Reports Difficulty using hands for taps, buttons, cutlery, and/or writing: Reports  Review of Systems  Constitutional:  Positive for fatigue.  HENT:  Positive for mouth sores. Negative for mouth dryness and nose dryness.   Eyes:  Negative for pain, itching and dryness.  Respiratory:  Negative for shortness of breath and difficulty breathing.   Cardiovascular:  Negative for chest pain and palpitations.  Gastrointestinal:  Negative for blood in stool, constipation and diarrhea.  Endocrine: Negative for increased urination.  Genitourinary:  Negative for difficulty urinating.  Musculoskeletal:  Positive for joint  pain, joint pain, joint swelling and morning stiffness. Negative for myalgias, muscle tenderness and myalgias.  Skin:  Positive for color change. Negative for rash and redness.  Allergic/Immunologic: Negative for susceptible to infections.  Neurological:  Positive for numbness, headaches and weakness. Negative for dizziness and memory loss.  Hematological:  Positive for bruising/bleeding tendency.  Psychiatric/Behavioral:  Negative for confusion.    PMFS History:  Patient Active Problem List   Diagnosis Date Noted   Dysuria 09/07/2021   Rheumatoid arthritis ([place]) 05/27/2021   Chronic venous insufficiency 05/23/2021   AVM (arteriovenous malformation) 04/04/2021   CAD (coronary artery disease) 01/22/2021   Primary osteoarthritis of both knees 01/12/2021   Positive ANA (antinuclear antibody) 11/17/2020   Joint pain 11/13/2020   Elevated serum creatinine 08/26/2020   Current use of proton pump inhibitor 08/26/2020   Estrogen deficiency 08/21/2018   TMJ (dislocation of temporomandibular joint), initial encounter 05/05/2018   Anterior neck pain 05/05/2018   Chronic pain syndrome 11/09/2017   Chronic upper extremity pain [facility] Area of Pain) (Bilateral) (L>R) 11/09/2017   Fibromyalgia syndrome 11/09/2017   Osteoarthritis 11/09/2017   Osteoarthritis of lumbar facet joint (Bilateral) 11/09/2017   Lumbar facet arthropathy (Bilateral) 11/09/2017   Lumbar facet syndrome (Bilateral) (L>R) 11/09/2017   Lumbar foraminal stenosis (multilevel) (Bilateral) 11/09/2017   DDD (degenerative disc disease), lumbar 11/09/2017   Thoracic levoscoliosis 11/09/2017   Thoracic facet syndrome (Bilateral) (L>R) 11/09/2017   Thoracic facet arthropathy (Bilateral) (R>L) 11/09/2017   DDD (degenerative disc disease),  thoracic 11/09/2017   Thoracolumbar IVDD 11/09/2017   DDD (degenerative disc disease), cervical 11/09/2017   Osteoarthritis of cervical facet (Bilateral) 11/09/2017   Grade 1 Anterolisthesis of C7  over T1 11/09/2017   Cervical foraminal stenosis (Bilateral) 11/09/2017   History of fusion of cervical spine (C5-6 ACDF) 11/09/2017   Cervical facet syndrome (Bilateral) 11/09/2017   Disorder of skeletal system 11/09/2017   Cervical radiculitis (Bilateral) 11/09/2017   Lumbar Epidural lipomatosis 11/09/2017   Chronic sacroiliac joint pain (Bilateral) (L>R) 11/09/2017   Chronic hip pain (Bilateral) (L>R) 11/09/2017   Chronic upper back pain (Primary Area of Pain) (midline) 10/24/2017   Chronic neck pain (Secondary Area of Pain) (Bilateral)  (L>R) 10/24/2017   Chronic low back pain (Fourth Area of Pain) (Bilateral) (L>R) 10/24/2017   Chronic lower extremity pain (Fifth Area of Pain) (Bilateral) (L>R) 10/24/2017   Lumbar Grade 1 Retrolisthesis of L1-2 and L2-3 10/24/2017   Other long term (current) drug therapy 10/24/2017   Other specified health status 10/24/2017   Long term current use of opiate analgesic 10/24/2017   Long term prescription opiate use 10/24/2017   Opiate use 10/24/2017   DM type 2 (diabetes mellitus, type 2) ([place]) 06/14/2014   Pharmacologic therapy 06/14/2014   Pedal edema    Post-menopausal 06/23/2012   Lumbar disc disease with radiculopathy 11/18/2011   HTN (hypertension) 07/30/2011   Raynaud disease 07/30/2011   Obesity 07/30/2011   Routine general medical examination at a health care facility 03/28/2011   Palpitations 04/27/2010   UTI (urinary tract infection) 03/18/2010   Problems influencing health status 12/11/2009   Depression with anxiety 06/14/2008   Vitamin D deficiency 05/13/2008   ANXIETY 03/25/2008   Osteopenia 03/25/2008   Hypothyroidism 03/28/2007   Hyperlipidemia associated with type 2 diabetes mellitus ([place]) 03/28/2007   Other iron deficiency anemias 03/28/2007   PANIC ATTACK 03/28/2007   KERATOCONJUNCTIVITIS SICCA 03/28/2007   Mitral valve disorder 03/28/2007   ABNORMAL HEART RHYTHMS 03/28/2007   Raynaud's syndrome 03/28/2007   GERD  03/28/2007   IBS 03/28/2007   Rosacea 03/28/2007   PLANTAR FASCIITIS 03/28/2007   MIGRAINES, HX OF 03/28/2007    Past Medical History:  Diagnosis Date   Acute renal insufficiency 05/31/2014   pt not aware of this diagnosis   Anemia, iron deficiency    Anxiety    Bilateral lower extremity edema    a. uses torsemide   Cervical dysplasia    abnormal paps   Colon cancer screening 06/14/2014   Coronary artery disease    Cough 08/08/2012   Degenerative disk disease 11/19/2011   Depression    Diabetes mellitus type II    Diverticulosis    DJD (degenerative joint disease)    Drug rash 05/22/2011   Dry eyes    Dysrhythmia    metoprolol.   Edema    Elevated liver enzymes 03/21/2012   Encounter for routine gynecological examination 06/14/2014   ESOPHAGITIS 03/28/2007   Qualifier: Hospitalized for  By: Marcelino Scot CMA, [place] Bil[place]     Fibromyalgia    GASTRITIS 03/28/2007   Qualifier: History of  By: Marcelino Scot CMA, [place] Bilberry     GERD (gastroesophageal reflux disease)    Hemorrhoids    external   HLD (hyperlipidemia)    HNP (herniated nucleus pulposus) 11/1997   T6,7,8 with DJD   HTN (hypertension)    Hyperglycemia 05/13/2008   Qualifier: Diagnosis of  By: Glori Bickers MD, Carmell [place]    Hypothyroidism    Interstitial lung disease ([place])  pt not aware of this   Left ovarian cyst    x 3, rupture   Osteoarthritis    hands   Osteopenia    mild-11/01; improved 12/05   Other screening mammogram 08/18/2011   Palpitations    PERSONAL HISTORY ALLERGY UNSPEC MEDICINAL AGENT 03/18/2010   Qualifier: Diagnosis of  By: Glori Bickers MD, Carmell [place]    PONV (postoperative nausea and vomiting)    Raynaud's disease    Recurrent HSV (herpes simplex virus)    lesions in nose or mouth with frequent ulcers. d/t meds and dry mouth   Rheumatoid arthritis ([place])    Rhinitis 03/21/2012   Routine general medical examination at a health care facility 03/28/2011    Family History  Problem Relation Age of Onset    Emphysema Father        + smoker   Lung cancer Mother        + smoker   Coronary artery disease Mother        relatively young   Lymphoma Sister    Heart disease Brother    Lymphoma Brother    Parkinson's disease Brother    Anemia Brother        aplastic    Diabetes Brother    Leukemia Son    Iron deficiency Daughter    Breast cancer Neg Hx    Past Surgical History:  Procedure Laterality Date   ABDOMINAL EXPLORATION SURGERY     ACDF  1991   BIOPSY OF SKIN SUBCUTANEOUS TISSUE AND/OR MUCOUS MEMBRANE Left 04/08/2021   Procedure: BIOPSY OF SKIN SUBCUTANEOUS TISSUE AND/OR MUCOUS MEMBRANE ( REMOVAL SKIN NODULE);  Surgeon: Katha Cabal, MD;  Location: [facility] ORS;  Service: Vascular;  Laterality: Left;   COLONOSCOPY  08/2000   Diverticulosis; hemorrhoids   HAND SURGERY Left    left thumb. PIN REMOVED   LASIK     bilateral   Social History   Social History Narrative   Married1 Investment banker, corporate to dean at [employer] regularly exerciseDaily caffeine use: 2/day.   Lives with husband. Feels safe in her home.   Immunization History  Administered Date(s) Administered   Fluad Quad(high Dose 65+) 08/07/2020   Influenza Split 06/19/2011   Influenza Whole 08/01/2009   Influenza, High Dose Seasonal PF 08/14/2018, 07/03/2019   Influenza,inj,Quad PF,6+ Mos 06/14/2014, 08/25/2015, 07/26/2016, 08/19/2017   Moderna Sars-Covid-2 Vaccination 09/17/2020   PFIZER(Purple Top)SARS-COV-2 Vaccination 11/27/2019, 12/18/2019   Pneumococcal Conjugate-13 08/25/2015   Pneumococcal Polysaccharide-23 08/25/2016   Td 11/25/2003   Tdap 06/14/2014   Zoster, Live 09/16/2014     Objective: Vital Signs: BP (!) 152/84 (BP Location: Left Arm, Patient Position: Sitting, Cuff Size: Normal)    Pulse 76    Ht 5\' 3"  (1.6 m)    Wt 193 lb 6.4 oz (87.7 kg)    BMI 34.26 kg/m    Physical Exam Vitals and nursing note reviewed.  Constitutional:      Appearance: She is well-developed.  HENT:     Head:  Normocephalic and atraumatic.  Eyes:     Conjunctiva/sclera: Conjunctivae normal.  Pulmonary:     Effort: Pulmonary effort is normal.  Abdominal:     Palpations: Abdomen is soft.  Musculoskeletal:     Cervical back: Normal range of motion.  Skin:    General: Skin is warm and dry.     Capillary Refill: Capillary refill takes less than 2 seconds.  Neurological:     Mental Status: She is alert and oriented to person, place,  and time.  Psychiatric:        Behavior: Behavior normal.     Musculoskeletal Exam: C-spine has painful and limited range of motion with lateral rotation as well as flexion extension.  Thoracic kyphosis noted.  Shoulder joints have painful range of motion with limitation upon internal rotation.  Elbow joints have good range of motion with no tenderness or inflammation.  She has tenderness over both wrist joints with some inflammation in the left wrist.  Tenderness and synovitis over the right third and fourth MCPs, right third PIP, and left second and third PIP joints.  Hip joints have slightly limited range of motion but no groin pain.  Knee joints have good range of motion with no warmth or effusion.  Ankle joints have good range of motion with no joint tenderness at this time.  CDAI Exam: CDAI Score: 13  Patient Global: 5 mm; Provider Global: 5 mm Swollen: 6 ; Tender: 6  Joint Exam 10/06/2021      Right  Left  Wrist     Swollen Tender  MCP 3  Swollen Tender     MCP 4  Swollen Tender     PIP 2     Swollen Tender  PIP 3  Swollen Tender  Swollen Tender     Investigation: No additional findings.  Imaging: No results found.  Recent Labs: Lab Results  Component Value Date   WBC 9.2 08/26/2021   HGB 12.6 08/26/2021   PLT 359 08/26/2021   NA 136 08/26/2021   K 5.2 08/26/2021   CL 93 (L) 08/26/2021   CO2 26 08/26/2021   GLUCOSE 176 (H) 08/26/2021   BUN 11 08/26/2021   CREATININE 0.78 08/26/2021   BILITOT 0.2 08/26/2021   ALKPHOS 82 08/26/2021   AST  12 08/26/2021   ALT 13 08/26/2021   PROT 7.1 08/26/2021   ALBUMIN 4.6 08/26/2021   CALCIUM 9.4 08/26/2021   GFRAA 75 03/24/2021   QFTBGOLDPLUS NEGATIVE 01/13/2021    Speciality Comments: No specialty comments available.  Procedures:  No procedures performed Allergies: Ceftin [cefuroxime], Clonidine derivatives, Erythromycin, Keflex [cephalexin], Penicillins, Sulfonamide derivatives, Ciprofloxacin, Fluoxetine hcl, Furosemide, Gabapentin, Paroxetine, Pregabalin, Tetracycline, Venlafaxine, Etodolac, Macrobid [nitrofurantoin], Amitriptyline hcl, Atorvastatin, Benicar [olmesartan medoxomil], Cephalexin, Cetirizine hcl, Diovan [valsartan], and Naproxen sodium   Assessment / Plan:     Visit Diagnoses: Rheumatoid arthritis of multiple sites with negative rheumatoid factor [facility]): She presents today with ongoing joint pain and inflammation involving multiple joints.  Her pain has been most severe in both hands and both wrist joints.  She has ongoing inflammation in the left wrist as well as several MCP and PIP joints as described above.  She is currently on prednisone 10 mg 1 tablet daily.  She presented today to initiate Actemra.  Indications, contraindications, potential side effects were reviewed again today in the office in detail and all questions were addressed. She will follow-up in the office in 6 weeks to assess her response and tolerability.  She will remain on prednisone 10 mg until her follow-up visit at which time we can further discuss tapering prednisone slowly.  Medication counseling:  Baseline Immunosuppressant Therapy Labs Quantiferon TB Gold Latest Ref Rng & Units 01/13/2021  Quantiferon TB Gold Plus NEGATIVE NEGATIVE    Hepatitis Latest Ref Rng & Units 01/13/2021  Hep B Surface Ag NON-REACTI NON-REACTIVE  Hep B IgM NON-REACTI NON-REACTIVE  Hep C Ab NON-REACTI NON-REACTIVE  Hep C Ab NON-REACTI NON-REACTIVE    Lab Results  Component Value  Date   HIV NON-REACTIVE 01/13/2021     Immunoglobulin Electrophoresis Latest Ref Rng & Units 01/13/2021  IgA  70 - 320 mg/dL 156  IgG 600 - 1,540 mg/dL 1,246  IgM 50 - 300 mg/dL 92    Serum Protein Electrophoresis Latest Ref Rng & Units 08/26/2021  Total Protein 6.0 - 8.5 g/dL 7.1  Albumin 3.8 - 4.8 g/dL -  Alpha-1 0.2 - 0.3 g/dL -  Alpha-2 0.5 - 0.9 g/dL -  Beta Globulin 0.4 - 0.6 g/dL -  Beta 2 0.2 - 0.5 g/dL -  Gamma Globulin 0.8 - 1.7 g/dL -    Lab Results  Component Value Date   TPMT 20 01/13/2021     Lipid Panel Lab Results  Component Value Date   CHOL 168 08/20/2020   HDL 42.30 08/20/2020   LDLCALC 99 08/20/2020   LDLDIRECT 149.0 07/26/2016   TRIG 133.0 08/20/2020   CHOLHDL 4 08/20/2020     Chest x-ray: High resolution chest CT ordered on 01/16/2021 and results reviewed today in the office.  Counseled patient that Actemra is an IL-6 blocking agent.   Counseled patient on purpose, proper use, and adverse effects of Actemra.  Reviewed the most common adverse effects including infections, injection site reaction, bowel injury, and rarely cancer and conditions of the nervous system.  Reviewed that the medication should be held during infections.  Discussed that there is the possibility of an increased risk of malignancy but it is not well understood if this increased risk is due to the medication or the disease state.  Counseled patient that Actemra should be held prior to scheduled surgery.  Counseled patient to avoid live vaccines while on Actemra.  Recommend annual influenza, Pneumovax 23, Prevnar 13, and Shingrix as indicated.   Reviewed the importance of regular labs while on Actemra therapy including the need for routine lipid panel.  Advised patient to get standing labs one month after starting Actemra.  Provided patient with standing lab orders. P rovided patient with medication education material and answered all questions.  Patient voiced understanding.  Patient consented to Actemra.  Will upload consent  into the media tab.  Reviewed storage instructions of Actemra with patient.  Advised patient that initial Actemra injection must be given in the office.  Will apply for Actemra through patient's insurance.    Patient dose will be  162 mg every 14 days based on weight <100 kg .  Prescription pending lab results and/or insurance approval.  High risk medication use: Actemra 162 mg subcutaneous injections every 14 days. D/c Imuran-GI intolerance.  She is not a good candidate for methotrexate or TNF inhibitors due to history of ILD.  Discussed the importance of holding Actemra if she develops signs or symptoms of an infection and to resume once the infection has completely cleared.  She voiced understanding. TB Gold negative on 01/13/2021.  CBC and CMP drawn on 08/26/2021.  She will return for lab work in 1 month and every 3 months to monitor for drug toxicity.  Standing orders for CBC and CMP remain in place.  ILD (interstitial lung disease) ([place]): High resolution chest CT ordered on 01/16/2021: Appearance of lungs is compatible with interstitial lung disease which is mildly progressive when compared to remote prior study from 9 years ago.  Followed by Dr. Chase Caller.  Antifibrotic therapy not indicated due to mild progression and minimal symptoms.   She initiated actemra today in the office for management of rheumatoid arthritis.   Raynaud's disease  without gangrene: She continues to experience intermittent symptoms of Raynaud's especially with the cooler weather temperatures.  No digital ulcerations or signs of gangrene were noted on examination today.  Venous ulcer of left leg [facility]): Previously had nonhealing venous ulcer of left lower extremity status post removal of AV malformation on 04/08/2021.  Ulceration has healed.  She has continue to follow-up with Dr. Delana Meyer.   Hx of arteriovenous malformation (AVM): Status post removal of AV malformation left leg.  Followed by vascular surgery-Dr. Delana Meyer.    Primary osteoarthritis of both knees: She has good range of motion of both knee joints on examination today.  Discussed the importance of lower extremity muscle strengthening.  She was encouraged to use a cane or walker to assist with ambulation but she declined at this time.  Discussed the importance of lower extremity muscle strengthening and fall prevention.  DDD (degenerative disc disease), cervical: Chronic pain.  She has painful limited range of motion of the C-spine on examination.  She has been following up with pain management.  She plans on going for a second opinion for further evaluation of the neck pain she has been experiencing.  History of fusion of cervical spine (C5-6 ACDF): Chronic pain.  Plans on getting a second opinion with neurosurgery.  DDD (degenerative disc disease), thoracic: Thoracic kyphosis noted.  She has been experiencing intermittent pain in the thoracic spine.  She is followed by pain management but plans on getting a second opinion from neurosurgery.  DDD (degenerative disc disease), lumbar: Chronic pain.  Fibromyalgia syndrome: She has generalized hyperalgesia and positive tender points on examination.  She continues to experience generalized myalgias and muscle tenderness due to fibromyalgia.  She has chronic fatigue secondary to insomnia.  Discussed the importance of regular exercise and good sleep hygiene.  She plans on continuing to follow-up with pain management.  Chronic pain syndrome: Followed by pain management.  Other medical conditions are listed as follows:  Type 2 diabetes mellitus without complication, without long-term current use of insulin (HCC)  Chronic renal impairment, stage 3a ([place])  Long term current use of opiate analgesic: Followed by pain management.  Primary hypertension  Mitral valve disorder  Hyperlipidemia associated with type 2 diabetes mellitus ([place])  History of hypothyroidism  History of gastroesophageal reflux  (GERD)  Depression with anxiety  History of IBS  Orders: Orders Placed This Encounter  Procedures   Lipid panel   Meds ordered this encounter  Medications   Tocilizumab (ACTEMRA ACTPEN) 162 MG/0.9ML SOAJ    Sig: Inject 162 mg into the skin every 14 (fourteen) days.    Dispense:  5.4 mL    Refill:  0     Follow-Up Instructions: Return in about 6 weeks (around 11/17/2021) for Rheumatoid arthritis, ILD, Osteoarthritis, Fibromyalgia.   Ofilia Neas, PA-C  Note - This record has been created using Dragon software.  Chart creation errors have been sought, but may not always  have been located. Berger creation errors do not reflect on  the standard of medical care.

## 2021-10-06 ENCOUNTER — Other Ambulatory Visit: Payer: Self-pay

## 2021-10-06 ENCOUNTER — Encounter: Payer: Self-pay | Admitting: Physician Assistant

## 2021-10-06 ENCOUNTER — Ambulatory Visit: Payer: Medicare Other | Admitting: Physician Assistant

## 2021-10-06 VITALS — BP 152/84 | HR 76 | Ht 63.0 in | Wt 193.4 lb

## 2021-10-06 DIAGNOSIS — M797 Fibromyalgia: Secondary | ICD-10-CM | POA: Diagnosis not present

## 2021-10-06 DIAGNOSIS — E1169 Type 2 diabetes mellitus with other specified complication: Secondary | ICD-10-CM

## 2021-10-06 DIAGNOSIS — Z7189 Other specified counseling: Secondary | ICD-10-CM

## 2021-10-06 DIAGNOSIS — Z79899 Other long term (current) drug therapy: Secondary | ICD-10-CM | POA: Diagnosis not present

## 2021-10-06 DIAGNOSIS — E785 Hyperlipidemia, unspecified: Secondary | ICD-10-CM

## 2021-10-06 DIAGNOSIS — M51369 Other intervertebral disc degeneration, lumbar region without mention of lumbar back pain or lower extremity pain: Secondary | ICD-10-CM

## 2021-10-06 DIAGNOSIS — Z8774 Personal history of (corrected) congenital malformations of heart and circulatory system: Secondary | ICD-10-CM | POA: Diagnosis not present

## 2021-10-06 DIAGNOSIS — M5134 Other intervertebral disc degeneration, thoracic region: Secondary | ICD-10-CM | POA: Diagnosis not present

## 2021-10-06 DIAGNOSIS — I83029 Varicose veins of left lower extremity with ulcer of unspecified site: Secondary | ICD-10-CM

## 2021-10-06 DIAGNOSIS — I059 Rheumatic mitral valve disease, unspecified: Secondary | ICD-10-CM

## 2021-10-06 DIAGNOSIS — M5136 Other intervertebral disc degeneration, lumbar region: Secondary | ICD-10-CM | POA: Diagnosis not present

## 2021-10-06 DIAGNOSIS — Z79891 Long term (current) use of opiate analgesic: Secondary | ICD-10-CM

## 2021-10-06 DIAGNOSIS — E119 Type 2 diabetes mellitus without complications: Secondary | ICD-10-CM

## 2021-10-06 DIAGNOSIS — F418 Other specified anxiety disorders: Secondary | ICD-10-CM

## 2021-10-06 DIAGNOSIS — G894 Chronic pain syndrome: Secondary | ICD-10-CM

## 2021-10-06 DIAGNOSIS — M170 Bilateral primary osteoarthritis of knee: Secondary | ICD-10-CM

## 2021-10-06 DIAGNOSIS — Z8639 Personal history of other endocrine, nutritional and metabolic disease: Secondary | ICD-10-CM

## 2021-10-06 DIAGNOSIS — Z981 Arthrodesis status: Secondary | ICD-10-CM

## 2021-10-06 DIAGNOSIS — I10 Essential (primary) hypertension: Secondary | ICD-10-CM

## 2021-10-06 DIAGNOSIS — Z8719 Personal history of other diseases of the digestive system: Secondary | ICD-10-CM

## 2021-10-06 DIAGNOSIS — I7300 Raynaud's syndrome without gangrene: Secondary | ICD-10-CM | POA: Diagnosis not present

## 2021-10-06 DIAGNOSIS — N2889 Other specified disorders of kidney and ureter: Secondary | ICD-10-CM

## 2021-10-06 DIAGNOSIS — M0609 Rheumatoid arthritis without rheumatoid factor, multiple sites: Secondary | ICD-10-CM

## 2021-10-06 DIAGNOSIS — M5030 Other cervical disc degeneration, unspecified cervical region: Secondary | ICD-10-CM | POA: Diagnosis not present

## 2021-10-06 DIAGNOSIS — J849 Interstitial pulmonary disease, unspecified: Secondary | ICD-10-CM

## 2021-10-06 DIAGNOSIS — L97929 Non-pressure chronic ulcer of unspecified part of left lower leg with unspecified severity: Secondary | ICD-10-CM

## 2021-10-06 DIAGNOSIS — N1831 Chronic kidney disease, stage 3a: Secondary | ICD-10-CM

## 2021-10-06 MED ORDER — ACTEMRA ACTPEN 162 MG/0.9ML ~~LOC~~ SOAJ: 162.0000 mg | SUBCUTANEOUS | 0 refills | Status: DC

## 2021-10-06 NOTE — Patient Instructions (Addendum)
Your next ACTEMRA dose is due on 10/20/21, 11/03/21, and every 14 days thereafter  HOLD ACTEMRA if you have signs or symptoms of an infection. You can resume once you feel better or back to your baseline. HOLD ACTEMRA if you start antibiotics to treat an infection. HOLD ACTEMRA around the time of surgery/procedures. Your surgeon will be able to provide recommendations on when to hold BEFORE and when you are cleared to [place].  Pharmacy information: Your prescription will be shipped from [facility]. Their phone number is [phone]  Labs are due in 1 month then every 3 months. Lab hours are from Monday to Thursday 1:30-4:30pm and Friday 1:30-4pm. You do not need an appointment if you come for labs during these times.  How to manage an injection site reaction: Remember the 5 C's: COUNTER - leave on the counter at least 30 minutes but up to overnight to bring medication to room temperature. This may help prevent stinging COLD - place something cold (like an ice gel pack or cold water bottle) on the injection site just before cleansing with alcohol. This may help reduce pain CLARITIN - use Claritin (generic name is loratadine) for the first two weeks of treatment or the day of, the day before, and the day after injecting. This will help to minimize injection site reactions CORTISONE CREAM - apply if injection site is irritated and itching CALL ME - if injection site reaction is bigger than the size of your fist, looks infected, blisters, or if you develop hives

## 2021-10-06 NOTE — Progress Notes (Signed)
Pharmacy Note  Subjective:   Patient presents to clinic today to receive first dose of Actemra.  Patient running a fever or have signs/symptoms of infection? No  Patient currently on antibiotics for the treatment of infection? No  Patient have any upcoming invasive procedures/surgeries? No  Objective: CMP     Component Value Date/Time   NA 136 08/26/2021 1448   NA 135 (L) 03/06/2012 0546   K 5.2 08/26/2021 1448   K 4.9 03/06/2012 0546   CL 93 (L) 08/26/2021 1448   CL 99 03/06/2012 0546   CO2 26 08/26/2021 1448   CO2 28 03/06/2012 0546   GLUCOSE 176 (H) 08/26/2021 1448   GLUCOSE 135 (H) 04/07/2021 1015   GLUCOSE 138 (H) 03/06/2012 0546   BUN 11 08/26/2021 1448   BUN 21 (H) 03/06/2012 0546   CREATININE 0.78 08/26/2021 1448   CREATININE 0.90 03/24/2021 0930   CALCIUM 9.4 08/26/2021 1448   CALCIUM 8.4 (L) 03/06/2012 0546   PROT 7.1 08/26/2021 1448   PROT 6.7 03/06/2012 0546   ALBUMIN 4.6 08/26/2021 1448   ALBUMIN 2.9 (L) 03/06/2012 0546   AST 12 08/26/2021 1448   AST 36 03/06/2012 0546   ALT 13 08/26/2021 1448   ALT 143 (H) 03/06/2012 0546   ALKPHOS 82 08/26/2021 1448   ALKPHOS 94 03/06/2012 0546   BILITOT 0.2 08/26/2021 1448   BILITOT 0.3 03/06/2012 0546   GFRNONAA >60 04/07/2021 1015   GFRNONAA 64 03/24/2021 0930   GFRAA 75 03/24/2021 0930    CBC    Component Value Date/Time   WBC 9.2 08/26/2021 1445   WBC 8.9 04/07/2021 1015   RBC 4.60 08/26/2021 1445   RBC 3.74 (L) 04/07/2021 1015   HGB 12.6 08/26/2021 1445   HCT 38.5 08/26/2021 1445   PLT 359 08/26/2021 1445   MCV 84 08/26/2021 1445   MCV 87 03/02/2012 1923   MCH 27.4 08/26/2021 1445   MCH 29.7 04/07/2021 1015   MCHC 32.7 08/26/2021 1445   MCHC 32.2 04/07/2021 1015   RDW 12.9 08/26/2021 1445   RDW 14.2 03/02/2012 1923   LYMPHSABS 1.0 08/26/2021 1445   MONOABS 0.9 04/07/2021 1015   EOSABS 0.1 08/26/2021 1445   BASOSABS 0.0 08/26/2021 1445    Baseline Immunosuppressant Therapy Labs TB  GOLD Quantiferon TB Gold Latest Ref Rng & Units 01/13/2021  Quantiferon TB Gold Plus NEGATIVE NEGATIVE   Hepatitis Panel Hepatitis Latest Ref Rng & Units 01/13/2021  Hep B Surface Ag NON-REACTI NON-REACTIVE  Hep B IgM NON-REACTI NON-REACTIVE  Hep C Ab NON-REACTI NON-REACTIVE  Hep C Ab NON-REACTI NON-REACTIVE   HIV Lab Results  Component Value Date   HIV NON-REACTIVE 01/13/2021   Immunoglobulins Immunoglobulin Electrophoresis Latest Ref Rng & Units 01/13/2021  IgA  70 - 320 mg/dL 156  IgG 600 - 1,540 mg/dL 1,246  IgM 50 - 300 mg/dL 92   SPEP Serum Protein Electrophoresis Latest Ref Rng & Units 08/26/2021  Total Protein 6.0 - 8.5 g/dL 7.1  Albumin 3.8 - 4.8 g/dL -  Alpha-1 0.2 - 0.3 g/dL -  Alpha-2 0.5 - 0.9 g/dL -  Beta Globulin 0.4 - 0.6 g/dL -  Beta 2 0.2 - 0.5 g/dL -  Gamma Globulin 0.8 - 1.7 g/dL -   G6PD No results found for: G6PDH TPMT Lab Results  Component Value Date   TPMT 20 01/13/2021    Chest x-ray: HRCT - 01/16/21 - appearance of the lungs is compatible with interstitial lung disease which is mildly progressive  when compared to remote prior study from 9 years ago. The spectrum of findings on today's examination is categorized as probable usual interstitial pneumonia (UIP) per current ATS guidelines.  Assessment/Plan:  Demonstrated proper injection technique with Actemra autoinjector demo device  Provider administered in left thigh with:  Sample Medication: Actemra 162mg /0.20mL PFS NDC: [address]-34 Lot: [id number] Expiration: 07/2022  Patient tolerated well.  Observed for 30 mins in office for adverse reaction and none noted.   Patient is to return in 1 month for labs and 6-8 weeks for follow-up appointment.  Standing orders placed.   She will continue Actemra 162mg  SQ every 14 days. Future prescriptions to be sent to [facility]. Ordered as no printer today  Actemra approved through patient assistance .   Rx sent to: [place] ([facility] for Actemra:  [phone].  Patient provided with pharmacy phone number and advised to call later this week to schedule shipment to home.  All questions encouraged and answered.  Instructed patient to call with any further questions or concerns.  Knox Saliva, PharmD, MPH, BCPS Clinical Pharmacist (Rheumatology and Pulmonology)  10/06/2021 11:28 AM

## 2021-10-14 ENCOUNTER — Encounter: Payer: Medicare Other | Admitting: Family Medicine

## 2021-10-22 DIAGNOSIS — Z79899 Other long term (current) drug therapy: Secondary | ICD-10-CM | POA: Diagnosis not present

## 2021-10-22 DIAGNOSIS — G894 Chronic pain syndrome: Secondary | ICD-10-CM | POA: Diagnosis not present

## 2021-10-22 DIAGNOSIS — M5134 Other intervertebral disc degeneration, thoracic region: Secondary | ICD-10-CM | POA: Diagnosis not present

## 2021-10-22 DIAGNOSIS — M5137 Other intervertebral disc degeneration, lumbosacral region: Secondary | ICD-10-CM | POA: Diagnosis not present

## 2021-10-22 DIAGNOSIS — M961 Postlaminectomy syndrome, not elsewhere classified: Secondary | ICD-10-CM | POA: Diagnosis not present

## 2021-10-22 DIAGNOSIS — K5903 Drug induced constipation: Secondary | ICD-10-CM | POA: Diagnosis not present

## 2021-10-22 DIAGNOSIS — M4725 Other spondylosis with radiculopathy, thoracolumbar region: Secondary | ICD-10-CM | POA: Diagnosis not present

## 2021-10-22 DIAGNOSIS — Z79891 Long term (current) use of opiate analgesic: Secondary | ICD-10-CM | POA: Diagnosis not present

## 2021-10-25 ENCOUNTER — Other Ambulatory Visit: Payer: Self-pay | Admitting: Family Medicine

## 2021-10-26 ENCOUNTER — Other Ambulatory Visit: Payer: Self-pay | Admitting: Physician Assistant

## 2021-10-26 NOTE — Telephone Encounter (Signed)
Next Visit: 11/19/2021  Last Visit: 10/06/2021  Last Fill:09/29/2021  Dx: Rheumatoid arthritis of multiple sites with negative rheumatoid factor  Current Dose per office note on 10/06/2021: She will remain on prednisone 10 mg until her follow-up visit at which time we can further discuss tapering prednisone slowly.  Okay to refill Prednisone?

## 2021-11-03 ENCOUNTER — Telehealth: Payer: Self-pay | Admitting: *Deleted

## 2021-11-03 NOTE — Telephone Encounter (Signed)
Patient advised we received a fax from [employer] about her being on long term steroids. Reviewed by Hazel Sams, PA-C, she states patient is due to update DEXA and previously ordered by PCP. Patient made aware we received fax and advised to have PCP schedule her DEXA.

## 2021-11-06 NOTE — Progress Notes (Signed)
Office Visit Note  Patient: Tina Berger             Date of Birth: January 17, 1951           MRN: [id number]             PCP: Abner Greenspan, MD Referring: Tower, [name], MD Visit Date: 11/19/2021 Occupation: @GUAROCC @  Subjective:  Medication monitoring   History of Present Illness: Tina Berger is a 71 y.o. female with history of seronegative rheumatoid arthritis and ILD. She is on actemra 162 mg sq injections every 14 days. She initiated therapy on 10/06/21.  She is tolerating Actemra without any side effects or injection site reactions.  She remains on prednisone 10 mg daily.  She has noticed a significant improvement in her symptoms since starting on actemra.  She has noticed less pain and swelling in her hands and wrist joints.  She has been able to be more mobile on current therapy as well.  She would like to try to start tapering prednisone.  She denies any increased SOB or coughing.   She was evaluated in the ED on 11/07/21 for a headache.  She underwent a thorough work-up at that time and received trigger point injections.  She was discharged and was encouraged to follow-up with her PCP.  Of note she had discontinued Cymbalta prior to her headache starting.  She was previously on Cymbalta for the past 16 years.  She has not restarted on Cymbalta and does not plan to.  She continues to have chronic neck pain and stiffness.  She has not establish care with a new neurosurgeon yet.    Activities of Daily Living:  Patient reports morning stiffness for 20-30 minutes.   Patient Reports nocturnal pain.  Difficulty dressing/grooming: Denies Difficulty climbing stairs: Reports Difficulty getting out of chair: Reports Difficulty using hands for taps, buttons, cutlery, and/or writing: Reports  Review of Systems  Constitutional:  Positive for fatigue.  HENT:  Positive for mouth sores, mouth dryness and nose dryness.   Eyes:  Positive for dryness. Negative for pain, itching and visual  disturbance.  Respiratory:  Negative for cough, hemoptysis, shortness of breath and difficulty breathing.   Cardiovascular:  Positive for palpitations. Negative for chest pain, hypertension and swelling in legs/feet.  Gastrointestinal:  Negative for blood in stool, constipation and diarrhea.  Endocrine: Negative for increased urination.  Genitourinary:  Positive for nocturia. Negative for difficulty urinating and painful urination.  Musculoskeletal:  Positive for joint pain, joint pain, joint swelling, myalgias, morning stiffness, muscle tenderness and myalgias. Negative for muscle weakness.  Skin:  Positive for color change. Negative for pallor, rash, hair loss, nodules/bumps, redness, skin tightness, ulcers and sensitivity to sunlight.  Allergic/Immunologic: Negative for susceptible to infections.  Neurological:  Positive for dizziness, headaches and memory loss.  Hematological:  Positive for bruising/bleeding tendency. Negative for swollen glands.  Psychiatric/Behavioral:  Negative for depressed mood and sleep disturbance. The patient is not nervous/anxious.    PMFS History:  Patient Active Problem List   Diagnosis Date Noted   Dysuria 09/07/2021   Rheumatoid arthritis ([place]) 05/27/2021   Chronic venous insufficiency 05/23/2021   AVM (arteriovenous malformation) 04/04/2021   CAD (coronary artery disease) 01/22/2021   Primary osteoarthritis of both knees 01/12/2021   Positive ANA (antinuclear antibody) 11/17/2020   Joint pain 11/13/2020   Elevated serum creatinine 08/26/2020   Current use of proton pump inhibitor 08/26/2020   Estrogen deficiency 08/21/2018   TMJ (dislocation of  temporomandibular joint), initial encounter 05/05/2018   Anterior neck pain 05/05/2018   Chronic pain syndrome 11/09/2017   Chronic upper extremity pain [facility] Pain) (Bilateral) (L>R) 11/09/2017   Fibromyalgia syndrome 11/09/2017   Osteoarthritis 11/09/2017   Osteoarthritis of lumbar facet joint  (Bilateral) 11/09/2017   Lumbar facet arthropathy (Bilateral) 11/09/2017   Lumbar facet syndrome (Bilateral) (L>R) 11/09/2017   Lumbar foraminal stenosis (multilevel) (Bilateral) 11/09/2017   DDD (degenerative disc disease), lumbar 11/09/2017   Thoracic levoscoliosis 11/09/2017   Thoracic facet syndrome (Bilateral) (L>R) 11/09/2017   Thoracic facet arthropathy (Bilateral) (R>L) 11/09/2017   DDD (degenerative disc disease), thoracic 11/09/2017   Thoracolumbar IVDD 11/09/2017   DDD (degenerative disc disease), cervical 11/09/2017   Osteoarthritis of cervical facet (Bilateral) 11/09/2017   Grade 1 Anterolisthesis of C7 over T1 11/09/2017   Cervical foraminal stenosis (Bilateral) 11/09/2017   History of fusion of cervical spine (C5-6 ACDF) 11/09/2017   Cervical facet syndrome (Bilateral) 11/09/2017   Disorder of skeletal system 11/09/2017   Cervical radiculitis (Bilateral) 11/09/2017   Lumbar Epidural lipomatosis 11/09/2017   Chronic sacroiliac joint pain (Bilateral) (L>R) 11/09/2017   Chronic hip pain (Bilateral) (L>R) 11/09/2017   Chronic upper back pain (Primary Area of Pain) (midline) 10/24/2017   Chronic neck pain (Secondary Area of Pain) (Bilateral)  (L>R) 10/24/2017   Chronic low back pain (Fourth Area of Pain) (Bilateral) (L>R) 10/24/2017   Chronic lower extremity pain (Fifth Area of Pain) (Bilateral) (L>R) 10/24/2017   Lumbar Grade 1 Retrolisthesis of L1-2 and L2-3 10/24/2017   Other long term (current) drug therapy 10/24/2017   Other specified health status 10/24/2017   Long term current use of opiate analgesic 10/24/2017   Long term prescription opiate use 10/24/2017   Opiate use 10/24/2017   DM type 2 (diabetes mellitus, type 2) ([place]) 06/14/2014   Pharmacologic therapy 06/14/2014   Pedal edema    Post-menopausal 06/23/2012   Lumbar disc disease with radiculopathy 11/18/2011   HTN (hypertension) 07/30/2011   Raynaud disease 07/30/2011   Obesity 07/30/2011   Routine  general medical examination at a health care facility 03/28/2011   Palpitations 04/27/2010   UTI (urinary tract infection) 03/18/2010   Problems influencing health status 12/11/2009   Depression with anxiety 06/14/2008   Vitamin D deficiency 05/13/2008   ANXIETY 03/25/2008   Osteopenia 03/25/2008   Hypothyroidism 03/28/2007   Hyperlipidemia associated with type 2 diabetes mellitus ([place]) 03/28/2007   Other iron deficiency anemias 03/28/2007   PANIC ATTACK 03/28/2007   KERATOCONJUNCTIVITIS SICCA 03/28/2007   Mitral valve disorder 03/28/2007   ABNORMAL HEART RHYTHMS 03/28/2007   Raynaud's syndrome 03/28/2007   GERD 03/28/2007   IBS 03/28/2007   Rosacea 03/28/2007   PLANTAR FASCIITIS 03/28/2007   MIGRAINES, HX OF 03/28/2007    Past Medical History:  Diagnosis Date   Acute renal insufficiency 05/31/2014   pt not aware of this diagnosis   Anemia, iron deficiency    Anxiety    Bilateral lower extremity edema    a. uses torsemide   Cervical dysplasia    abnormal paps   Colon cancer screening 06/14/2014   Coronary artery disease    Cough 08/08/2012   Degenerative disk disease 11/19/2011   Depression    Diabetes mellitus type II    Diverticulosis    DJD (degenerative joint disease)    Drug rash 05/22/2011   Dry eyes    Dysrhythmia    metoprolol.   Edema    Elevated liver enzymes 03/21/2012   Encounter  for routine gynecological examination 06/14/2014   ESOPHAGITIS 03/28/2007   Qualifier: Hospitalized for  By: Marcelino Scot CMA, [place]     Fibromyalgia    GASTRITIS 03/28/2007   Qualifier: History of  By: Marcelino Scot CMA, [place] Bilberry     GERD (gastroesophageal reflux disease)    Hemorrhoids    external   HLD (hyperlipidemia)    HNP (herniated nucleus pulposus) 11/1997   T6,7,8 with DJD   HTN (hypertension)    Hyperglycemia 05/13/2008   Qualifier: Diagnosis of  By: Glori Bickers MD, Carmell [place]    Hypothyroidism    Interstitial lung disease ([place])    pt not aware of this   Left  ovarian cyst    x 3, rupture   Osteoarthritis    hands   Osteopenia    mild-11/01; improved 12/05   Other screening mammogram 08/18/2011   Palpitations    PERSONAL HISTORY ALLERGY UNSPEC MEDICINAL AGENT 03/18/2010   Qualifier: Diagnosis of  By: Glori Bickers MD, Carmell [place]    PONV (postoperative nausea and vomiting)    Raynaud's disease    Recurrent HSV (herpes simplex virus)    lesions in nose or mouth with frequent ulcers. d/t meds and dry mouth   Rheumatoid arthritis ([place])    Rhinitis 03/21/2012   Routine general medical examination at a health care facility 03/28/2011    Family History  Problem Relation Age of Onset   Emphysema Father        + smoker   Lung cancer Mother        + smoker   Coronary artery disease Mother        relatively young   Lymphoma Sister    Heart disease Brother    Lymphoma Brother    Parkinson's disease Brother    Anemia Brother        aplastic    Diabetes Brother    Leukemia Son    Iron deficiency Daughter    Breast cancer Neg Hx    Past Surgical History:  Procedure Laterality Date   ABDOMINAL EXPLORATION SURGERY     ACDF  1991   BIOPSY OF SKIN SUBCUTANEOUS TISSUE AND/OR MUCOUS MEMBRANE Left 04/08/2021   Procedure: BIOPSY OF SKIN SUBCUTANEOUS TISSUE AND/OR MUCOUS MEMBRANE ( REMOVAL SKIN NODULE);  Surgeon: Katha Cabal, MD;  Location: [facility] ORS;  Service: Vascular;  Laterality: Left;   COLONOSCOPY  08/2000   Diverticulosis; hemorrhoids   HAND SURGERY Left    left thumb. PIN REMOVED   LASIK     bilateral   Social History   Social History Narrative   Married1 Investment banker, corporate to dean at [employer] regularly exerciseDaily caffeine use: 2/day.   Lives with husband. Feels safe in her home.   Immunization History  Administered Date(s) Administered   Fluad Quad(high Dose 65+) 08/07/2020   Influenza Split 06/19/2011   Influenza Whole 08/01/2009   Influenza, High Dose Seasonal PF 08/14/2018, 07/03/2019   Influenza,inj,Quad PF,6+ Mos 06/14/2014,  08/25/2015, 07/26/2016, 08/19/2017   Moderna Sars-Covid-2 Vaccination 09/17/2020   PFIZER(Purple Top)SARS-COV-2 Vaccination 11/27/2019, 12/18/2019   Pneumococcal Conjugate-13 08/25/2015   Pneumococcal Polysaccharide-23 08/25/2016   Td 11/25/2003   Tdap 06/14/2014   Zoster, Live 09/16/2014     Objective: Vital Signs: BP (!) 160/93 (BP Location: Left Arm, Patient Position: Sitting, Cuff Size: Normal)    Pulse 65    Ht 5\' 3"  (1.6 m)    Wt 191 lb (86.6 kg)    BMI 33.83 kg/m    Physical Exam Vitals and  nursing note reviewed.  Constitutional:      Appearance: She is well-developed.  HENT:     Head: Normocephalic and atraumatic.  Eyes:     Conjunctiva/sclera: Conjunctivae normal.  Cardiovascular:     Rate and Rhythm: Normal rate and regular rhythm.     Heart sounds: Normal heart sounds.  Pulmonary:     Effort: Pulmonary effort is normal.     Breath sounds: Normal breath sounds.  Abdominal:     General: Bowel sounds are normal.     Palpations: Abdomen is soft.  Musculoskeletal:     Cervical back: Normal range of motion.  Skin:    General: Skin is warm and dry.     Capillary Refill: Capillary refill takes less than 2 seconds.  Neurological:     Mental Status: She is alert and oriented to person, place, and time.  Psychiatric:        Behavior: Behavior normal.     Musculoskeletal Exam: C-spine has painful limited range of motion.  Thoracic kyphosis noted.  Shoulder joints, elbow joints, wrist joints, MCPs, PIPs, DIPs have good range of motion with no synovitis.  She was able to make a complete fist bilaterally.  Hip joints have good range of motion with no groin pain.  Knee joints have good range of motion with no warmth or effusion.  Ankle joints have good range of motion with no tenderness or joint swelling.  CDAI Exam: CDAI Score: 0.4  Patient Global: 2 mm; Provider Global: 2 mm Swollen: 0 ; Tender: 1  Joint Exam 11/19/2021      Right  Left  Cervical Spine   Tender         Investigation: No additional findings.  Imaging: CT HEAD WO CONTRAST (5MM)  Result Date: 11/07/2021 CLINICAL DATA:  Headache. EXAM: CT HEAD WITHOUT CONTRAST TECHNIQUE: Contiguous axial images were obtained from the base of the skull through the vertex without intravenous contrast. RADIATION DOSE REDUCTION: This exam was performed according to the departmental dose-optimization program which includes automated exposure control, adjustment of the mA and/or kV according to patient size and/or use of iterative reconstruction technique. COMPARISON:  None. FINDINGS: Brain: No evidence of acute infarction, hemorrhage, hydrocephalus, extra-axial collection or mass lesion/mass effect. There is mild diffuse low-attenuation within the subcortical and periventricular white matter compatible with chronic microvascular disease. Prominence of the sulci identified compatible with brain atrophy. Vascular: No hyperdense vessel or unexpected calcification. Skull: Prominent venous lakes identified within the left frontal and parietal calvarium near the convexity. Sinuses/Orbits: No acute finding. Other: None IMPRESSION: 1. No acute intracranial abnormalities. 2. Chronic small vessel ischemic disease and brain atrophy. Electronically Signed   By: Kerby Moors M.D.   On: 11/07/2021 07:17    Recent Labs: Lab Results  Component Value Date   WBC 7.2 11/07/2021   HGB 14.2 11/07/2021   PLT 327 11/07/2021   NA 137 11/07/2021   K 4.0 11/07/2021   CL 98 11/07/2021   CO2 26 11/07/2021   GLUCOSE 155 (H) 11/07/2021   BUN 17 11/07/2021   CREATININE 0.63 11/07/2021   BILITOT 0.7 11/07/2021   ALKPHOS 60 11/07/2021   AST 21 11/07/2021   ALT 22 11/07/2021   PROT 8.1 11/07/2021   ALBUMIN 4.3 11/07/2021   CALCIUM 9.4 11/07/2021   GFRAA 75 03/24/2021   QFTBGOLDPLUS NEGATIVE 01/13/2021    Speciality Comments: No specialty comments available.  Procedures:  No procedures performed Allergies: Ceftin [cefuroxime],  Clonidine derivatives, Erythromycin, Keflex [cephalexin], Penicillins,  Sulfonamide derivatives, Ciprofloxacin, Fluoxetine hcl, Furosemide, Gabapentin, Paroxetine, Pregabalin, Tetracycline, Venlafaxine, Etodolac, Macrobid [nitrofurantoin], Amitriptyline hcl, Atorvastatin, Benicar [olmesartan medoxomil], Cephalexin, Cetirizine hcl, Diovan [valsartan], and Naproxen sodium   Assessment / Plan:     Visit Diagnoses: Rheumatoid arthritis of multiple sites with negative rheumatoid factor ([place]): She has no synovitis on examination today.  She has noticed significant improvement in her joint pain and inflammation since initiating Actemra on 10/06/2021.  She has been tolerating Actemra without any side effects or injection site reactions.  She has not missed any doses of Actemra recently.  She has not had any recent infections.  She was able to ambulate with less difficulty today in the office and has been able to be more mobile.  The pain and inflammation in her hands has improved significantly.  She remains on prednisone 10 mg daily.  We discussed starting to taper prednisone by 1 mg every 2 weeks as tolerated.  New prescription for prednisone will be sent to the pharmacy.  She was advised to notify us if she develops any new or worsening symptoms as we start to taper the dose of prednisone.  She will remain on Actemra as prescribed.  She will follow-up in the office in 2 to 3 months.  High risk medication use - Actemra 162 mg subcutaneous injections every 14 days.  D/c Imuran-GI intolerance.  She is not a good candidate for methotrexate or TNF inhibitors due to history of ILD. CBC and CMP updated on 11/07/2021.  She will be due to update lab work in April and every 3 months to monitor for drug toxicity.  TB Gold negative on 01/13/2021.  Future order for TB gold will be placed today.- Plan: QuantiFERON-TB Gold Plus No recent infections.  Discussed the importance of holding Actemra if she develops signs or symptoms of an  infection and to resume once the infection is completely cleared.  ILD (interstitial lung disease) ([place]) - HR chest CT ordered on 01/16/2021: Mildy progressive compared to 30yr ago.  Followed by Dr. Chase Caller.  Antifibrotic therapy is not indicated at this time due to mild progression and minimal symptoms.  She is not experiencing any increased shortness of breath and has not had a cough recently. She will remain on Actemra as prescribed.  We will start to taper prednisone by 1 mg every 2 weeks.  Raynaud's disease without gangrene: Not currently active.  No digital ulcerations or signs of gangrene were noted on examination today.  Venous ulcer of left leg ([place]): Ulceration has healed.  Hx of arteriovenous malformation (AVM) - Status post removal of AV malformation left leg.  Followed by vascular surgery-Dr. Delana Meyer.   Primary osteoarthritis of both knees: She has good range of motion of both knee joints on examination today.  No warmth or effusion was noted.  She has not had any recent falls.  She is able to ambulate today with less difficulty than on her last follow-up visit on 10/07/2021.  DDD (degenerative disc disease), cervical: Chronic pain.  Offered a referral for a second opinion with neurosurgery but she has declined at this time.  She plans on continuing to follow-up with pain management and will be scheduling a follow-up visit with her PCP.  History of fusion of cervical spine (C5-6 ACDF): Chronic pain  DDD (degenerative disc disease), thoracic: Thoracic kyphosis noted.  DDD (degenerative disc disease), lumbar: Difficult to assess lumbar range of motion on examination today.  Fibromyalgia syndrome: She continues to experience intermittent myalgias and muscle tenderness  due to fibromyalgia.  Overall her symptoms have been more tolerable recently.  She has been able to be more mobile as her joint pain and inflammation has improved. Of note she has discontinued Cymbalta after taking it for  16 years prior.  She does not plan to restart on Cymbalta. She plans on continuing to follow-up with pain management.  Chronic pain syndrome: She follows up closely with pain management.  Other medical conditions are listed as follows:  Type 2 diabetes mellitus without complication, without long-term current use of insulin ([facility])  Chronic renal impairment, stage 3a ([place])  Long term current use of opiate analgesic: She follows up with pain management closely.  Primary hypertension  Mitral valve disorder  Hyperlipidemia associated with type 2 diabetes mellitus ([place])  History of hypothyroidism  History of gastroesophageal reflux (GERD)  Depression with anxiety: She recently discontinued Cymbalta.  History of IBS  Orders: Orders Placed This Encounter  Procedures   QuantiFERON-TB Gold Plus   No orders of the defined types were placed in this encounter.   Follow-Up Instructions: Return in about 3 months (around 02/16/2022) for Rheumatoid arthritis, ILD.   Ofilia Neas, PA-C  Note - This record has been created using Dragon software.  Chart creation errors have been sought, but may not always  have been located. Such creation errors do not reflect on  the standard of medical care.

## 2021-11-07 ENCOUNTER — Other Ambulatory Visit: Payer: Self-pay

## 2021-11-07 ENCOUNTER — Emergency Department
Admission: EM | Admit: 2021-11-07 | Discharge: 2021-11-07 | Disposition: A | Discharge status: Home / Self Care | Payer: Medicare Other | Attending: Emergency Medicine | Admitting: Emergency Medicine

## 2021-11-07 ENCOUNTER — Emergency Department: Payer: Medicare Other

## 2021-11-07 DIAGNOSIS — R519 Headache, unspecified: Secondary | ICD-10-CM

## 2021-11-07 LAB — CBC WITH DIFFERENTIAL/PLATELET
Abs Immature Granulocytes: 0.02 10*3/uL (ref 0.00–0.07)
Basophils Absolute: 0.1 10*3/uL (ref 0.0–0.1)
Basophils Relative: 1 %
Eosinophils Absolute: 0.3 10*3/uL (ref 0.0–0.5)
Eosinophils Relative: 4 %
HCT: 44.3 % (ref 36.0–46.0)
Hemoglobin: 14.2 g/dL (ref 12.0–15.0)
Immature Granulocytes: 0 %
Lymphocytes Relative: 29 %
Lymphs Abs: 2.1 10*3/uL (ref 0.7–4.0)
MCH: 28.3 pg (ref 26.0–34.0)
MCHC: 32.1 g/dL (ref 30.0–36.0)
MCV: 88.2 fL (ref 80.0–100.0)
Monocytes Absolute: 0.7 10*3/uL (ref 0.1–1.0)
Monocytes Relative: 10 %
Neutro Abs: 4.1 10*3/uL (ref 1.7–7.7)
Neutrophils Relative %: 56 %
Platelets: 327 10*3/uL (ref 150–400)
RBC: 5.02 MIL/uL (ref 3.87–5.11)
RDW: 14.6 % (ref 11.5–15.5)
WBC: 7.2 10*3/uL (ref 4.0–10.5)
nRBC: 0 % (ref 0.0–0.2)

## 2021-11-07 LAB — COMPREHENSIVE METABOLIC PANEL
ALT: 22 U/L (ref 0–44)
AST: 21 U/L (ref 15–41)
Albumin: 4.3 g/dL (ref 3.5–5.0)
Alkaline Phosphatase: 60 U/L (ref 38–126)
Anion gap: 13 (ref 5–15)
BUN: 17 mg/dL (ref 8–23)
CO2: 26 mmol/L (ref 22–32)
Calcium: 9.4 mg/dL (ref 8.9–10.3)
Chloride: 98 mmol/L (ref 98–111)
Creatinine, Ser: 0.63 mg/dL (ref 0.44–1.00)
GFR, Estimated: >60 mL/min (ref >60)
Glucose, Bld: 155 mg/dL — ABNORMAL HIGH (ref 70–99)
Potassium: 4 mmol/L (ref 3.5–5.1)
Sodium: 137 mmol/L (ref 135–145)
Total Bilirubin: 0.7 mg/dL (ref 0.3–1.2)
Total Protein: 8.1 g/dL (ref 6.5–8.1)

## 2021-11-07 MED ORDER — LIDOCAINE HCL (PF) 1 % IJ SOLN
10.0000 mL | Freq: Once | INTRAMUSCULAR | Status: DC
  Filled 2021-11-07: qty 10

## 2021-11-07 MED ORDER — OXYCODONE-ACETAMINOPHEN 5-325 MG PO TABS
1.0000 | ORAL_TABLET | ORAL | Status: DC | PRN
  Administered 2021-11-07: 1 via ORAL
  Filled 2021-11-07: qty 1

## 2021-11-07 NOTE — ED Notes (Signed)
Spouse to desk approx every 5 minutes asking for an update. Spouse updated multiple times.

## 2021-11-07 NOTE — ED Triage Notes (Addendum)
Pt with frontal headache, nausea, sensitivity to sound. Pt's spouse is providing history for pt. Pt states is also having neck pain. Pt rates headache as "severe". Explanation provided to pt and spouse regarding need to remain NPO until head ct resulted.

## 2021-11-07 NOTE — ED Notes (Signed)
Attempted to obtain vs. Per family member stated pt is in pain and b/p will be high so there is no need to get a b/p. Unable to obtain vs at this moment.

## 2021-11-07 NOTE — ED Provider Notes (Signed)
[facility] Provider Note    Event Date/Time   First MD Initiated Contact with Patient 11/07/21 [phone]     (approximate)   History   Headache   HPI  Tina Berger is a 71 y.o. female who presents to the ED for evaluation of Headache   I review outpatient rheumatology visit from 12/20.  History of chronic pain syndrome on chronic opiates followed by pain management.   Patient presents to the ED, accompanied by her husband for evaluation of a bad headache.  She reports frequently getting headaches like this, but they are typically controlled with her home opiate regimen, but this 1 has been more persistent.  She reports typical quality to the headache without novel features.  Reports this started with a crick in her neck, left greater than right, and progressed over her occiput to her scalp and has been aching and severe.  Reports inability to sleep last night, and due to this she presents to the ED for evaluation.  Denies fever, neck stiffness, falls, syncope, weakness or strokelike symptoms.  Ambulating at baseline.  Vision at baseline.   Physical Exam   Triage Vital Signs: ED Triage Vitals  Enc Vitals Group     BP 11/07/21 0528 (!) 188/112     Pulse Rate 11/07/21 0528 89     Resp 11/07/21 0528 17     Temp 11/07/21 0528 98.4 F (36.9 C)     Temp Source 11/07/21 0528 Oral     SpO2 11/07/21 0528 95 %     Weight 11/07/21 0541 200 lb (90.7 kg)     Height 11/07/21 0541 5\' 3"  (1.6 m)     Head Circumference --      Peak Flow --      Pain Score 11/07/21 0541 10     Pain Loc --      Pain Edu? --      Excl. in [place]? --     Most recent vital signs: Vitals:   11/07/21 0528 11/07/21 0742  BP: (!) 188/112   Pulse: 89 90  Resp: 17 18  Temp: 98.4 F (36.9 C)   SpO2: 95% 98%    General: Awake, no distress.  Ambulatory independently CV:  Good peripheral perfusion.  Resp:  Normal effort.  Abd:  No distention.  MSK:  No deformity noted.  Tenderness to  palpation and palpable muscular cord to left greater than right trapezius musculature and paraspinal cervical musculature.  No overlying skin changes or signs of trauma.  No midline spinal tenderness throughout. Neuro:  No focal deficits appreciated. Cranial nerves II through XII intact 5/5 strength and sensation in all 4 extremities Other:     ED Results / Procedures / Treatments   Labs (all labs ordered are listed, but only abnormal results are displayed) Labs Reviewed  COMPREHENSIVE METABOLIC PANEL - Abnormal; Notable for the following components:      Result Value   Glucose, Bld 155 (*)    All other components within normal limits  CBC WITH DIFFERENTIAL/PLATELET    EKG    RADIOLOGY CT head reviewed by me without evidence of acute intracranial pathology.  Official radiology report(s): CT HEAD WO CONTRAST (5MM)  Result Date: 11/07/2021 CLINICAL DATA:  Headache. EXAM: CT HEAD WITHOUT CONTRAST TECHNIQUE: Contiguous axial images were obtained from the base of the skull through the vertex without intravenous contrast. RADIATION DOSE REDUCTION: This exam was performed according to the departmental dose-optimization program which includes automated  exposure control, adjustment of the mA and/or kV according to patient size and/or use of iterative reconstruction technique. COMPARISON:  None. FINDINGS: Brain: No evidence of acute infarction, hemorrhage, hydrocephalus, extra-axial collection or mass lesion/mass effect. There is mild diffuse low-attenuation within the subcortical and periventricular white matter compatible with chronic microvascular disease. Prominence of the sulci identified compatible with brain atrophy. Vascular: No hyperdense vessel or unexpected calcification. Skull: Prominent venous lakes identified within the left frontal and parietal calvarium near the convexity. Sinuses/Orbits: No acute finding. Other: None IMPRESSION: 1. No acute intracranial abnormalities. 2. Chronic  small vessel ischemic disease and brain atrophy. Electronically Signed   By: Kerby Moors M.D.   On: 11/07/2021 07:17    PROCEDURES and INTERVENTIONS:  Procedures  Trigger point injection After discussing risks versus benefits, patient is agreeable to proceed with trigger point injection for treatment of acute muscular spasm. Risks discussed include worsening pain, infection, neurovascular damage and pneumothorax.  Point of maximal tenderness was identified at bilateral paraspinal cervical musculature, bilateral trapezius musculature and left-sided paraspinal thoracic musculature just medial to her scapula. Overlying skin was cleaned with alcohol swabs. 1% lidocaine without epinephrine was incrementally injected, after confirming negative drawback, into this musculature. Total of 73milliliters injected. Well-tolerated without any apparent complications.   Medications  oxyCODONE-acetaminophen (PERCOCET/ROXICET) 5-325 MG per tablet 1 tablet (1 tablet Oral Given 11/07/21 0731)  lidocaine (PF) (XYLOCAINE) 1 % injection 10 mL (has no administration in time range)     IMPRESSION / MDM / ASSESSMENT AND PLAN / ED COURSE  I reviewed the triage vital signs and the nursing notes.  71 year old female with chronic pain syndrome presents to the ED with a bad headache, without evidence of significant acute pathology and ultimately suitable for outpatient management.  She appears uncomfortable with a headache, but no meningismus, neurologic deficits, vascular deficits or signs of trauma.  She is ambulatory at her baseline.  Basic labs are reassuring without leukocytosis or metabolic derangements.  CT head without evidence of ICH or CVA.  I provided trigger point junction with improvement of her symptoms.  We discussed providing her chronic opiates, but patient and her husband are requesting discharge prior to this.  We discussed the possibility of undiagnosed pathology and the potential for worsening at  home due to a degree of incomplete work-up, they acknowledge this and expressed understanding and agreement for outpatient management.  We discussed return precautions.  Clinical Course as of 11/07/21 0857  Sat Nov 07, 2021  8676 Trigger point injection performed and well tolerated. 10cc plain lido total across 5 sites [DS]  7209 Patient's husband approaches me and requests discharge for the patient to take her home, get her home medications and lay down in bed.  We discussed the possibility of undiagnosed pathology, life-threatening illnesses due to incomplete work-up.  He acknowledges this, as that she, and they report that they are okay going home. [DS]    Clinical Course User Index [DS] Vladimir Crofts, MD     FINAL CLINICAL IMPRESSION(S) / ED DIAGNOSES   Final diagnoses:  Bad headache     Rx / DC Orders   ED Discharge Orders     None        Note:  This document was prepared using Dragon voice recognition software and may include unintentional dictation errors.   Vladimir Crofts, MD 11/07/21 [phone]

## 2021-11-17 ENCOUNTER — Other Ambulatory Visit: Payer: Self-pay | Admitting: Physician Assistant

## 2021-11-17 NOTE — Telephone Encounter (Signed)
Next Visit: 11/19/2021   Last Visit: 10/06/2021   Last Fill:09/29/2021   Dx: Rheumatoid arthritis of multiple sites with negative rheumatoid factor   Current Dose per office note on 10/06/2021: She will remain on prednisone 10 mg until her follow-up visit at which time we can further discuss tapering prednisone slowly.   Okay to refill Prednisone?

## 2021-11-19 ENCOUNTER — Other Ambulatory Visit: Payer: Self-pay

## 2021-11-19 ENCOUNTER — Ambulatory Visit (INDEPENDENT_AMBULATORY_CARE_PROVIDER_SITE_OTHER): Payer: Medicare Other | Admitting: Physician Assistant

## 2021-11-19 ENCOUNTER — Encounter: Payer: Self-pay | Admitting: Physician Assistant

## 2021-11-19 VITALS — BP 160/93 | HR 65 | Ht 63.0 in | Wt 191.0 lb

## 2021-11-19 DIAGNOSIS — I7300 Raynaud's syndrome without gangrene: Secondary | ICD-10-CM

## 2021-11-19 DIAGNOSIS — M797 Fibromyalgia: Secondary | ICD-10-CM

## 2021-11-19 DIAGNOSIS — J849 Interstitial pulmonary disease, unspecified: Secondary | ICD-10-CM

## 2021-11-19 DIAGNOSIS — N1831 Chronic kidney disease, stage 3a: Secondary | ICD-10-CM

## 2021-11-19 DIAGNOSIS — M5134 Other intervertebral disc degeneration, thoracic region: Secondary | ICD-10-CM | POA: Diagnosis not present

## 2021-11-19 DIAGNOSIS — E119 Type 2 diabetes mellitus without complications: Secondary | ICD-10-CM

## 2021-11-19 DIAGNOSIS — L97929 Non-pressure chronic ulcer of unspecified part of left lower leg with unspecified severity: Secondary | ICD-10-CM

## 2021-11-19 DIAGNOSIS — M170 Bilateral primary osteoarthritis of knee: Secondary | ICD-10-CM | POA: Diagnosis not present

## 2021-11-19 DIAGNOSIS — M0609 Rheumatoid arthritis without rheumatoid factor, multiple sites: Secondary | ICD-10-CM

## 2021-11-19 DIAGNOSIS — Z8774 Personal history of (corrected) congenital malformations of heart and circulatory system: Secondary | ICD-10-CM | POA: Diagnosis not present

## 2021-11-19 DIAGNOSIS — M5030 Other cervical disc degeneration, unspecified cervical region: Secondary | ICD-10-CM | POA: Diagnosis not present

## 2021-11-19 DIAGNOSIS — Z981 Arthrodesis status: Secondary | ICD-10-CM | POA: Diagnosis not present

## 2021-11-19 DIAGNOSIS — I059 Rheumatic mitral valve disease, unspecified: Secondary | ICD-10-CM

## 2021-11-19 DIAGNOSIS — F418 Other specified anxiety disorders: Secondary | ICD-10-CM

## 2021-11-19 DIAGNOSIS — Z79899 Other long term (current) drug therapy: Secondary | ICD-10-CM | POA: Diagnosis not present

## 2021-11-19 DIAGNOSIS — M5136 Other intervertebral disc degeneration, lumbar region: Secondary | ICD-10-CM

## 2021-11-19 DIAGNOSIS — Z8719 Personal history of other diseases of the digestive system: Secondary | ICD-10-CM

## 2021-11-19 DIAGNOSIS — I83029 Varicose veins of left lower extremity with ulcer of unspecified site: Secondary | ICD-10-CM | POA: Diagnosis not present

## 2021-11-19 DIAGNOSIS — Z79891 Long term (current) use of opiate analgesic: Secondary | ICD-10-CM

## 2021-11-19 DIAGNOSIS — E1169 Type 2 diabetes mellitus with other specified complication: Secondary | ICD-10-CM

## 2021-11-19 DIAGNOSIS — E785 Hyperlipidemia, unspecified: Secondary | ICD-10-CM

## 2021-11-19 DIAGNOSIS — G894 Chronic pain syndrome: Secondary | ICD-10-CM

## 2021-11-19 DIAGNOSIS — Z8639 Personal history of other endocrine, nutritional and metabolic disease: Secondary | ICD-10-CM

## 2021-11-19 DIAGNOSIS — I10 Essential (primary) hypertension: Secondary | ICD-10-CM

## 2021-11-19 MED ORDER — PREDNISONE 5 MG PO TABS: ORAL_TABLET | ORAL | 0 refills | Status: DC

## 2021-11-19 MED ORDER — PREDNISONE 1 MG PO TABS: ORAL_TABLET | ORAL | 0 refills | Status: DC

## 2021-11-19 NOTE — Telephone Encounter (Signed)
Prednisone prescriptions are pended. Please review and send to the pharmacy. Thanks!

## 2021-12-04 ENCOUNTER — Other Ambulatory Visit: Payer: Self-pay | Admitting: *Deleted

## 2021-12-04 MED ORDER — DULOXETINE HCL 60 MG PO CPEP: 60.0000 mg | ORAL_CAPSULE | Freq: Every day | ORAL | 3 refills | Status: DC

## 2021-12-04 NOTE — Telephone Encounter (Signed)
Received refill request Cymbalta Last refill 08/26/20 #90/3 Last office visit with Dr. Glori Bickers 11/13/20 Okay to refill.

## 2021-12-06 ENCOUNTER — Other Ambulatory Visit: Payer: Self-pay | Admitting: Family Medicine

## 2021-12-18 ENCOUNTER — Encounter: Payer: Self-pay | Admitting: Family Medicine

## 2021-12-18 ENCOUNTER — Other Ambulatory Visit: Payer: Self-pay | Admitting: Rheumatology

## 2021-12-18 MED ORDER — PREDNISONE 1 MG PO TABS: ORAL_TABLET | ORAL | 0 refills | Status: DC

## 2021-12-18 MED ORDER — PREDNISONE 5 MG PO TABS: ORAL_TABLET | ORAL | 0 refills | Status: DC

## 2021-12-18 NOTE — Telephone Encounter (Signed)
Patient left a voicemail stating she had some questions about her medications and requests a return call.  ?

## 2021-12-18 NOTE — Telephone Encounter (Signed)
Patient states she was seen on 11/19/2021. Patient was advised to start reducing Prednisone. Patient states she will start with Prednisone 7 mg on 12/22/2021 for 2 weeks and then 6 mg.  ? ?Next Visit: 02/03/2022 ? ?Last Visit: 11/19/2021 ? ?Last Fill: 11/19/2021 (Prednisone) 10/06/2021 (Actemra) ? ?Dx:  Rheumatoid arthritis of multiple sites with negative rheumatoid factor  ?  ?Current Dose per office note on 11/19/2021: Actemra 162 mg subcutaneous injections every 14 days. We discussed starting to taper prednisone by 1 mg every 2 weeks as tolerated.  ? ?Labs: 11/07/2021 Glucose 155,  ? ?TB Gold: 01/13/2021 Neg  ? ?Okay to refill Actemra and Prednisone?  (Actemra goes to patient assistance) ?

## 2021-12-19 ENCOUNTER — Other Ambulatory Visit: Payer: Self-pay | Admitting: Physician Assistant

## 2021-12-25 ENCOUNTER — Other Ambulatory Visit: Payer: Self-pay | Admitting: Family Medicine

## 2021-12-28 ENCOUNTER — Other Ambulatory Visit: Payer: Self-pay | Admitting: *Deleted

## 2021-12-28 DIAGNOSIS — M79671 Pain in right foot: Secondary | ICD-10-CM

## 2021-12-30 ENCOUNTER — Other Ambulatory Visit: Payer: Self-pay | Admitting: Rheumatology

## 2021-12-30 DIAGNOSIS — M0609 Rheumatoid arthritis without rheumatoid factor, multiple sites: Secondary | ICD-10-CM

## 2021-12-30 DIAGNOSIS — J849 Interstitial pulmonary disease, unspecified: Secondary | ICD-10-CM

## 2021-12-30 DIAGNOSIS — Z79899 Other long term (current) drug therapy: Secondary | ICD-10-CM

## 2021-12-30 MED ORDER — ACTEMRA ACTPEN 162 MG/0.9ML ~~LOC~~ SOAJ: 162.0000 mg | SUBCUTANEOUS | 0 refills | Status: DC

## 2021-12-30 NOTE — Telephone Encounter (Signed)
Patient called the office requesting a refill of Actemra '162mg'$ /0.60m. Patient states she does not know what mail order service sends her medication. Patient states she is due for her next dose on 3/28. Patient states she should get a fax sometime today (3/15). ?

## 2021-12-30 NOTE — Telephone Encounter (Signed)
Next Visit: 02/03/2022 ? ?Last Visit: 11/19/2021 ? ?Last Fill: 10/06/2021 ? ?[id number] arthritis of multiple sites with negative rheumatoid factor  ? ?Current Dose per office note 11/19/2021: Actemra 162 mg subcutaneous injections every 14 days.  ? ?Labs: 11/07/2021 Glucose 155 ? ?TB Gold: 01/13/2021 Neg   ? ?Okay to refill Actemra?  ?

## 2021-12-31 ENCOUNTER — Other Ambulatory Visit: Payer: Self-pay | Admitting: Rheumatology

## 2021-12-31 ENCOUNTER — Encounter: Payer: Self-pay | Admitting: Rheumatology

## 2022-01-06 ENCOUNTER — Telehealth: Payer: Self-pay

## 2022-01-06 NOTE — Chronic Care Management (AMB) (Signed)
? ? ?Chronic Care Management ?Pharmacy Assistant  ? ?Name: Tina Berger  MRN: [id number] DOB: October 08, 1951 ? ? ?Reason for Encounter: Cholesterol Disease State ? ? ?Recent office visits:  ?None since last CCM contact ? ?Recent consult visits:  ?01/11/22-Podiatry-Tina Berger,DPM-[place] toe pain-xrays- no medication changes ?11/19/21-Rheumatology-Tina Dale,PA-follow up Rheumatoid arthritis -tolerating Actemra-She remains on prednisone 10 mg daily.  We discussed starting to taper prednisone by 1 mg every 2 weeks as tolerated. ?11/07/21-[facility] ED-Tina Smith,MD-Bad Headache-CT head normal, trigger point injections given-discharged to home  ?10/06/21-Rheumatology-Tina Dale,PA- Received first dose of Actemra ? ?Hospital visits:  ?None in previous 6 months ? ?Medications: ?Outpatient Encounter Medications as of 01/06/2022  ?Medication Sig  ? Acetaminophen (TYLENOL ARTHRITIS PAIN PO) Take 650 mg by mouth daily.  ? buPROPion (WELLBUTRIN XL) 150 MG 24 hr tablet TAKE 1 TABLET BY MOUTH  DAILY  ? Chlorphen-Phenyleph-ASA (ALKA-SELTZER PLUS COLD PO) Take by mouth as needed. Just for colds  ? cholecalciferol (VITAMIN D3) 25 MCG (1000 UNIT) tablet Take 1,000 Units by mouth daily.  ? cyclobenzaprine (FLEXERIL) 10 MG tablet TAKE 1/2 TO 1 TABLET BY  MOUTH ONCE DAILY AND 1  TABLET AT BEDTIME (Patient not taking: Reported on 09/28/2021)  ? Diclofenac Sodium 1.5 % SOLN Place 2 mLs onto the skin 4 (four) times daily.  ? DULoxetine (CYMBALTA) 60 MG capsule Take 1 capsule (60 mg total) by mouth daily.  ? esomeprazole (NEXIUM) 20 MG capsule TAKE 1 CAPSULE BY MOUTH  DAILY  ? famotidine (PEPCID) 40 MG tablet TAKE 1 TABLET BY MOUTH  DAILY  ? ferrous sulfate 325 (65 FE) MG EC tablet Take 325 mg by mouth daily with breakfast.  ? glucose blood test strip One Touch Ultra stripts blue-To check sugar once daily and as needed for DM2 250.00  ? GUAIFENESIN CR PO Take by mouth as needed.  ? HYDROmorphone HCl (EXALGO) 12 MG T24A SR tablet Take 12 mg by mouth 2  (two) times daily.  ? hyoscyamine (LEVSIN SL) 0.125 MG SL tablet TAKE 1 TABLET (0.125 MG TOTAL) BY MOUTH EVERY 4 (FOUR) HOURS AS NEEDED FOR CRAMPING.  ? ketoconazole (NIZORAL) 2 % shampoo SHAMPOO WITH A SMALL AMOUNT AS DIRECTED THREE TIMES A WEEK SHAMPOO SCALP 3 DAYS A WEEK, LET SHAMPOO SIT FOR 10 MINUTES AND RINSE OFF  ? levothyroxine (SYNTHROID) 150 MCG tablet TAKE 1 TABLET BY MOUTH  DAILY  ? LYSINE PO Take by mouth as needed. OTC for ulcer prophylaxis  ? Magnesium 400 MG CAPS Take by mouth daily.  ? metFORMIN (GLUCOPHAGE) 500 MG tablet TAKE 1 TABLET BY MOUTH TWICE  DAILY WITH MEALS  ? metoprolol succinate (TOPROL-XL) 50 MG 24 hr tablet TAKE 1 TABLET BY MOUTH  TWICE DAILY . TAKE WITH OR  IMMEDIATELY FOLLOWING A  MEAL.  ? ondansetron (ZOFRAN) 4 MG tablet Take 1 tablet (4 mg total) by mouth every 8 (eight) hours as needed for nausea or vomiting.  ? oxyCODONE (OXYCONTIN) 10 MG 12 hr tablet Take 10 mg by mouth 3 (three) times daily as needed. Beakthrough pain  ? predniSONE (DELTASONE) 1 MG tablet Take 2 tabs po qd x 2 weeks (along with '5mg'$  tab), then take 1 tab po qd x 2 weeks (along with '5mg'$  tab). Taper by '1mg'$  every 2 weeks.  ? predniSONE (DELTASONE) 5 MG tablet Take 1 tablet by mouth daily.  ? ramipril (ALTACE) 5 MG capsule TAKE 1 CAPSULE BY MOUTH  DAILY  ? rosuvastatin (CRESTOR) 5 MG tablet TAKE 1 TABLET BY  MOUTH  TWICE WEEKLY  ? Tocilizumab (ACTEMRA ACTPEN) 162 MG/0.9ML SOAJ Inject 162 mg into the skin every 14 (fourteen) days.  ? torsemide (DEMADEX) 20 MG tablet TAKE 1 TABLET BY MOUTH  DAILY AS NEEDED  ? ?No facility-administered encounter medications on file as of 01/06/2022.  ? ? ?01/06/2022 ?Name: Tina Berger MRN: [id number] DOB: 08-Dec-1950 ?Tina Berger is a 71 y.o. year old female who is a primary care patient of Tower, [name], MD.  ?Comprehensive medication review performed; Spoke to patient regarding cholesterol. ? ? ?Contacted patient on 01/12/22 to discuss hyperlipidemia. ? ?Lipid Panel ?   ?Component  Value Date/Time  ? CHOL 168 08/20/2020 0951  ? TRIG 133.0 08/20/2020 0951  ? HDL 42.30 08/20/2020 0951  ? [place] 99 08/20/2020 0951  ? LDLDIRECT 149.0 07/26/2016 1138  ?  ?10-year ASCVD risk score: The 10-year ASCVD risk score (Arnett DK, et al., 2019) is: 52% ?  Values used to calculate the score: ?    Age: 71 years ?    Sex: Female ?    Is Non-Hispanic African American: No ?    Diabetic: Yes ?    Tobacco smoker: Yes ?    Systolic Blood Pressure: 403 mmHg ?    Is BP treated: Yes ?    HDL Cholesterol: 42.3 mg/dL ?    Total Cholesterol: 168 mg/dL ? ?Current antihyperlipidemic regimen:  ?Rosuvastatin 5 mg - 1 tablet twice weekly- the patient confirms this dosing  ?Previous antihyperlipidemic medications tried: atorvastatin ? ?ASCVD risk enhancing conditions: age >2, DM, and HTN ? ?What recent interventions/DTPs have been made by any provider to improve Cholesterol control since last CPP Visit:  Adherence to Rosuvastatin '5mg'$  1 tablet twice weekly ? ?Any recent hospitalizations or ED visits since last visit with CPP? Yes   1/21/23[facility] ED- bad headache-trigger point injections ? ?What diet changes have been made to improve Cholesterol?  ?Choosing fresh vegetables and lean grilled meats for meals ? ?What exercise is being done to improve Cholesterol?  ? No formal exercise, the patient will do her own grocery shopping, visit stores , cleans her own house- she has lost a few pounds  ? ?Adherence Review: ?Does the patient have >5 day gap between last estimated fill dates? No ? ?Annual wellness visit in last year? No ?Most Recent BP reading: 160/93  65-P ? ?If Diabetic: ?Most recent A1C reading:6.5 ?Last eye exam / retinopathy screening:2021 ?Last diabetic foot exam:UTD ? ?Upcoming appointments: ?Pulmonology appointment with on 02/03/22 ? ?[facility] ?Medication Name/Dose: Last fill date Days supply  ?Metformin '500mg'$   10/12/21 90 ?Ramipril '5mg'$    11/10/21 90 ?Rosuvastatin '5mg'$   11/10/21 90 ? ? ? ?Tina Berger, CPP  notified ? ?Tina Berger, CCMA ?Health concierge  ?[phone]  ?

## 2022-01-11 ENCOUNTER — Other Ambulatory Visit: Payer: Self-pay | Admitting: Family Medicine

## 2022-01-11 ENCOUNTER — Ambulatory Visit (INDEPENDENT_AMBULATORY_CARE_PROVIDER_SITE_OTHER): Payer: Medicare Other

## 2022-01-11 ENCOUNTER — Ambulatory Visit (INDEPENDENT_AMBULATORY_CARE_PROVIDER_SITE_OTHER): Payer: Medicare Other | Admitting: Podiatry

## 2022-01-11 ENCOUNTER — Other Ambulatory Visit: Payer: Self-pay

## 2022-01-11 ENCOUNTER — Encounter: Payer: Self-pay | Admitting: Podiatry

## 2022-01-11 DIAGNOSIS — M2041 Other hammer toe(s) (acquired), right foot: Secondary | ICD-10-CM

## 2022-01-11 DIAGNOSIS — M2040 Other hammer toe(s) (acquired), unspecified foot: Secondary | ICD-10-CM

## 2022-01-11 DIAGNOSIS — M2042 Other hammer toe(s) (acquired), left foot: Secondary | ICD-10-CM | POA: Diagnosis not present

## 2022-01-11 NOTE — Progress Notes (Signed)
?Subjective:  ?Patient ID: Tina Berger, female    DOB: [birth year],  MRN: [id number] ?HPI ?Chief Complaint  ?Patient presents with  ? Toe Pain  ?  2nd toe bilateral (R>L) - hammertoe deformities x years, right is overlapping big toe, shoes uncomfortable-rubbing, always went barefoot, has Raynaud's and possible rheumatoid in feet-been on prednisone for over a year-on a tapering dose now and notices feet look worse as the taper goes down, grips the floor with toes when she walks  ? New Patient (Initial Visit)  ? ? ?71 y.o. female presents with the above complaint.  ? ?ROS: Denies fever chills nausea vomiting muscle aches pains calf pain back pain chest pain shortness of breath. ? ?Past Medical History:  ?Diagnosis Date  ? Acute renal insufficiency 05/31/2014  ? pt not aware of this diagnosis  ? Anemia, iron deficiency   ? Anxiety   ? Bilateral lower extremity edema   ? a. uses torsemide  ? Cervical dysplasia   ? abnormal paps  ? Colon cancer screening 06/14/2014  ? Coronary artery disease   ? Cough 08/08/2012  ? Degenerative disk disease 11/19/2011  ? Depression   ? Diabetes mellitus type II   ? Diverticulosis   ? DJD (degenerative joint disease)   ? Drug rash 05/22/2011  ? Dry eyes   ? Dysrhythmia   ? metoprolol.  ? Edema   ? Elevated liver enzymes 03/21/2012  ? Encounter for routine gynecological examination 06/14/2014  ? ESOPHAGITIS 03/28/2007  ? Qualifier: Hospitalized for  By: Marcelino Scot CMA, [place]    ? Fibromyalgia   ? GASTRITIS 03/28/2007  ? Qualifier: History of  By: Marcelino Scot CMA, [place] Bilberry    ? GERD (gastroesophageal reflux disease)   ? Hemorrhoids   ? external  ? HLD (hyperlipidemia)   ? HNP (herniated nucleus pulposus) 11/1997  ? T6,7,8 with DJD  ? HTN (hypertension)   ? Hyperglycemia 05/13/2008  ? Qualifier: Diagnosis of  By: Glori Bickers MD, Carmell Austria   ? Hypothyroidism   ? Interstitial lung disease ([place])   ? pt not aware of this  ? Left ovarian cyst   ? x 3, rupture  ? Osteoarthritis   ? hands  ?  Osteopenia   ? mild-11/01; improved 12/05  ? Other screening mammogram 08/18/2011  ? Palpitations   ? PERSONAL HISTORY ALLERGY UNSPEC MEDICINAL AGENT 03/18/2010  ? Qualifier: Diagnosis of  By: Glori Bickers MD, Carmell [place]   ? PONV (postoperative nausea and vomiting)   ? Raynaud's disease   ? Recurrent HSV (herpes simplex virus)   ? lesions in nose or mouth with frequent ulcers. d/t meds and dry mouth  ? Rheumatoid arthritis ([place])   ? Rhinitis 03/21/2012  ? Routine general medical examination at a health care facility 03/28/2011  ? ?Past Surgical History:  ?Procedure Laterality Date  ? ABDOMINAL EXPLORATION SURGERY    ? ACDF  1991  ? BIOPSY OF SKIN SUBCUTANEOUS TISSUE AND/OR MUCOUS MEMBRANE Left 04/08/2021  ? Procedure: BIOPSY OF SKIN SUBCUTANEOUS TISSUE AND/OR MUCOUS MEMBRANE ( REMOVAL SKIN NODULE);  Surgeon: Katha Cabal, MD;  Location: [facility] ORS;  Service: Vascular;  Laterality: Left;  ? COLONOSCOPY  08/2000  ? Diverticulosis; hemorrhoids  ? HAND SURGERY Left   ? left thumb. PIN REMOVED  ? LASIK    ? bilateral  ? ? ?Current Outpatient Medications:  ?  Acetaminophen (TYLENOL ARTHRITIS PAIN PO), Take 650 mg by mouth daily., Disp: , Rfl:  ?  buPROPion (WELLBUTRIN XL) 150  MG 24 hr tablet, TAKE 1 TABLET BY MOUTH  DAILY, Disp: 90 tablet, Rfl: 0 ?  Chlorphen-Phenyleph-ASA (ALKA-SELTZER PLUS COLD PO), Take by mouth as needed. Just for colds, Disp: , Rfl:  ?  cholecalciferol (VITAMIN D3) 25 MCG (1000 UNIT) tablet, Take 1,000 Units by mouth daily., Disp: , Rfl:  ?  cyclobenzaprine (FLEXERIL) 10 MG tablet, TAKE 1/2 TO 1 TABLET BY  MOUTH ONCE DAILY AND 1  TABLET AT BEDTIME (Patient not taking: Reported on 09/28/2021), Disp: 180 tablet, Rfl: 1 ?  Diclofenac Sodium 1.5 % SOLN, Place 2 mLs onto the skin 4 (four) times daily., Disp: 3 Bottle, Rfl: 3 ?  DULoxetine (CYMBALTA) 60 MG capsule, Take 1 capsule (60 mg total) by mouth daily., Disp: 90 capsule, Rfl: 3 ?  esomeprazole (NEXIUM) 20 MG capsule, TAKE 1 CAPSULE BY MOUTH  DAILY, Disp:  90 capsule, Rfl: 0 ?  famotidine (PEPCID) 40 MG tablet, TAKE 1 TABLET BY MOUTH  DAILY, Disp: 90 tablet, Rfl: 0 ?  ferrous sulfate 325 (65 FE) MG EC tablet, Take 325 mg by mouth daily with breakfast., Disp: , Rfl:  ?  FLUAD QUADRIVALENT 0.5 ML injection, , Disp: , Rfl:  ?  glucose blood test strip, One Touch Ultra stripts blue-To check sugar once daily and as needed for DM2 250.00, Disp: 100 each, Rfl: 3 ?  GUAIFENESIN CR PO, Take by mouth as needed., Disp: , Rfl:  ?  HYDROmorphone HCl (EXALGO) 12 MG T24A SR tablet, Take 12 mg by mouth 2 (two) times daily., Disp: , Rfl:  ?  hyoscyamine (LEVSIN SL) 0.125 MG SL tablet, TAKE 1 TABLET (0.125 MG TOTAL) BY MOUTH EVERY 4 (FOUR) HOURS AS NEEDED FOR CRAMPING., Disp: 270 tablet, Rfl: 1 ?  ketoconazole (NIZORAL) 2 % shampoo, SHAMPOO WITH A SMALL AMOUNT AS DIRECTED THREE TIMES A WEEK SHAMPOO SCALP 3 DAYS A WEEK, LET SHAMPOO SIT FOR 10 MINUTES AND RINSE OFF, Disp: 120 mL, Rfl: 2 ?  levothyroxine (SYNTHROID) 150 MCG tablet, TAKE 1 TABLET BY MOUTH  DAILY, Disp: 90 tablet, Rfl: 0 ?  LYSINE PO, Take by mouth as needed. OTC for ulcer prophylaxis, Disp: , Rfl:  ?  Magnesium 400 MG CAPS, Take by mouth daily., Disp: , Rfl:  ?  metFORMIN (GLUCOPHAGE) 500 MG tablet, TAKE 1 TABLET BY MOUTH TWICE  DAILY WITH MEALS, Disp: 180 tablet, Rfl: 2 ?  metoprolol succinate (TOPROL-XL) 50 MG 24 hr tablet, TAKE 1 TABLET BY MOUTH  TWICE DAILY . TAKE WITH OR  IMMEDIATELY FOLLOWING A  MEAL., Disp: 180 tablet, Rfl: 0 ?  ondansetron (ZOFRAN) 4 MG tablet, Take 1 tablet (4 mg total) by mouth every 8 (eight) hours as needed for nausea or vomiting., Disp: 20 tablet, Rfl: 0 ?  oxyCODONE (OXYCONTIN) 10 MG 12 hr tablet, Take 10 mg by mouth 3 (three) times daily as needed. Beakthrough pain, Disp: , Rfl:  ?  predniSONE (DELTASONE) 1 MG tablet, Take 2 tabs po qd x 2 weeks (along with '5mg'$  tab), then take 1 tab po qd x 2 weeks (along with '5mg'$  tab). Taper by '1mg'$  every 2 weeks., Disp: 42 tablet, Rfl: 0 ?  predniSONE  (DELTASONE) 5 MG tablet, Take 1 tablet by mouth daily., Disp: 30 tablet, Rfl: 0 ?  ramipril (ALTACE) 5 MG capsule, TAKE 1 CAPSULE BY MOUTH  DAILY, Disp: 90 capsule, Rfl: 0 ?  rosuvastatin (CRESTOR) 5 MG tablet, TAKE 1 TABLET BY MOUTH  TWICE WEEKLY, Disp: 26 tablet, Rfl: 0 ?  Tocilizumab (ACTEMRA ACTPEN) 162 MG/0.9ML SOAJ, Inject 162 mg into the skin every 14 (fourteen) days., Disp: 5.4 mL, Rfl: 0 ?  torsemide (DEMADEX) 20 MG tablet, TAKE 1 TABLET BY MOUTH  DAILY AS NEEDED, Disp: 90 tablet, Rfl: 1 ? ?Allergies  ?Allergen Reactions  ? Ceftin [Cefuroxime] Swelling  ?  Swelling, "legs turn blue".  Legs swelling, then blue, then a rash  ? Clonidine Derivatives   ?  Swelling of lips  ? Erythromycin Swelling  ?  Rash, swollen gums ?  ? Keflex [Cephalexin] Anaphylaxis  ?  Chest was broken out in a rash. Lips were swelling. Took a few days to occur, but it kept getting worse.                                                                                                                                                                                                  ? Penicillins Anaphylaxis  ?  Pt had a daughter with an allergy to this at a young age. Patient developed blisters anywhere her child touched her. Also, if she used the bathroom after daughter did while on keflex, it would cause a reaction for her. Lips and roof of mouth swelled up also.  ? Sulfonamide Derivatives Swelling  ?  Rash, swollen gums, lips  ? Ciprofloxacin   ?  REACTION: ? rash vs sun rxn. Rash   ? Fluoxetine Hcl   ?  REACTION: stomach problems. Severe pain in abdomen. Could not function d/t pain  ? Furosemide Swelling  ?  REACTION: swelling worsened in legs once taking  ? Gabapentin Other (See Comments)  ?  REACTION: edema of feet. Unable to get shoes on  ? Paroxetine   ?  REACTION: weight gain (30 pound weight gain  ? Pregabalin   ?  REACTION: swelling of feet and legs. Unable to get shoes on.  ? Tetracycline   ?  REACTION:inflammed genitals.   ?  Venlafaxine   ?  REACTION: sweating profusely  ? Etodolac   ?  REACTION: reaction not known  ? Macrobid [Nitrofurantoin]   ?  REACTION: ? Lung problem ?  ? Amitriptyline Hcl   ?  REACTION: sedating  ? Atorvastatin

## 2022-01-18 ENCOUNTER — Ambulatory Visit: Payer: Medicare Other | Admitting: Podiatry

## 2022-01-18 DIAGNOSIS — M797 Fibromyalgia: Secondary | ICD-10-CM

## 2022-01-18 DIAGNOSIS — M2040 Other hammer toe(s) (acquired), unspecified foot: Secondary | ICD-10-CM | POA: Diagnosis not present

## 2022-01-18 DIAGNOSIS — M2011 Hallux valgus (acquired), right foot: Secondary | ICD-10-CM

## 2022-01-18 DIAGNOSIS — M21611 Bunion of right foot: Secondary | ICD-10-CM

## 2022-01-18 DIAGNOSIS — M0609 Rheumatoid arthritis without rheumatoid factor, multiple sites: Secondary | ICD-10-CM | POA: Diagnosis not present

## 2022-01-19 ENCOUNTER — Other Ambulatory Visit: Payer: Self-pay | Admitting: Dermatology

## 2022-01-19 ENCOUNTER — Other Ambulatory Visit: Payer: Self-pay | Admitting: Physician Assistant

## 2022-01-19 NOTE — Telephone Encounter (Signed)
She may stay on prednisone 6 mg p.o. daily until her follow-up appointment.

## 2022-01-19 NOTE — Telephone Encounter (Signed)
Next Visit: 02/03/2022 ?  ?Last Visit: 11/19/2021 ?  ?Last Fill: 12/18/2021 ? ?Dx:  Rheumatoid arthritis of multiple sites with negative rheumatoid factor  ?  ?Current Dose per office note on 11/19/2021: We discussed starting to taper prednisone by 1 mg every 2 weeks as tolerated.  ? ?Spoke with patient and she states she is currently on 6 mg of Prednisone and due to taper to 5 mg on Monday. Patient states she is having increased pain especially in her shoulders. Patient states she stayed on Prednisone 6 mg due to the increased pain. Please advise.  ?

## 2022-01-19 NOTE — Telephone Encounter (Signed)
Patient advised she may stay on prednisone 6 mg p.o. daily until her follow-up appointment. ?

## 2022-01-20 DIAGNOSIS — M961 Postlaminectomy syndrome, not elsewhere classified: Secondary | ICD-10-CM | POA: Diagnosis not present

## 2022-01-20 DIAGNOSIS — M4725 Other spondylosis with radiculopathy, thoracolumbar region: Secondary | ICD-10-CM | POA: Diagnosis not present

## 2022-01-20 DIAGNOSIS — Z79891 Long term (current) use of opiate analgesic: Secondary | ICD-10-CM | POA: Diagnosis not present

## 2022-01-20 DIAGNOSIS — M5137 Other intervertebral disc degeneration, lumbosacral region: Secondary | ICD-10-CM | POA: Diagnosis not present

## 2022-01-20 DIAGNOSIS — K5903 Drug induced constipation: Secondary | ICD-10-CM | POA: Diagnosis not present

## 2022-01-20 DIAGNOSIS — G894 Chronic pain syndrome: Secondary | ICD-10-CM | POA: Diagnosis not present

## 2022-01-20 DIAGNOSIS — M5134 Other intervertebral disc degeneration, thoracic region: Secondary | ICD-10-CM | POA: Diagnosis not present

## 2022-01-20 MED ORDER — PREDNISONE 5 MG PO TABS: ORAL_TABLET | ORAL | 0 refills | Status: DC

## 2022-01-20 MED ORDER — PREDNISONE 1 MG PO TABS: 1.0000 mg | ORAL_TABLET | Freq: Every day | ORAL | 0 refills | Status: DC

## 2022-01-20 NOTE — Progress Notes (Signed)
? ?Office Visit Note ? ?Patient: Tina Berger             ?Date of Birth: 03-08-1951           ?MRN: [id number]             ?PCP: Abner Greenspan, MD ?Referring: Tower, [name], MD ?Visit Date: 02/03/2022 ?Occupation: '@GUAROCC'$ @ ? ?Subjective:  ?Pain in multiple joints  ? ?History of Present Illness: Tina Berger is a 71 y.o. female with history of seropositive rheumatoid arthritis, ILD, fibromyalgia, DDD, and osteoarthritis.  She remains on actemra 162 mg sq injections every 14 days.   She remains on prednisone 6 mg daily.  She initiated actemra on 10/06/21.  She has been tolerating actemra without any side effects or injection site reactions.  She continues to have ongoing pain involging multiple joints including both shoudlers, both wrists, and both hands.  She has ongoing swelling in her wrists and hands.  She had a fall on easter weekend  but had no major injuries at that time.  She states she was evaluated by Dr. Milinda Pointer followed by Dr. Sherryle Lis for chronic pain in her feet. She does not plan to proceed with right 2nd hammertoe correction or a bunionectomy at this time. She will be going to a shoe fitting this week.   ?She denies any recent infections. ?She denies any new or worsening pulmonary symptoms.  ? ? ?Activities of Daily Living:  ?Patient reports morning stiffness for 2  hours.   ?Patient Reports nocturnal pain.  ?Difficulty dressing/grooming: Denies ?Difficulty climbing stairs: Reports ?Difficulty getting out of chair: Reports ?Difficulty using hands for taps, buttons, cutlery, and/or writing: Reports ? ?Review of Systems  ?Constitutional:  Positive for fatigue.  ?HENT:  Positive for mouth dryness and nose dryness. Negative for mouth sores.   ?Eyes:  Positive for itching and dryness. Negative for pain.  ?Respiratory:  Negative for shortness of breath and difficulty breathing.   ?Cardiovascular:  Negative for chest pain and palpitations.  ?Gastrointestinal:  Positive for constipation and diarrhea.  Negative for blood in stool.  ?Endocrine: Negative for increased urination.  ?Genitourinary:  Negative for difficulty urinating.  ?Musculoskeletal:  Positive for joint pain, joint pain, joint swelling, myalgias, morning stiffness, muscle tenderness and myalgias.  ?Skin:  Negative for color change, rash and redness.  ?Allergic/Immunologic: Negative for susceptible to infections.  ?Neurological:  Positive for dizziness, headaches and weakness. Negative for numbness and memory loss.  ?Hematological:  Positive for bruising/bleeding tendency.  ?Psychiatric/Behavioral:  Positive for confusion.   ? ?PMFS History:  ?Patient Active Problem List  ? Diagnosis Date Noted  ?? Dysuria 09/07/2021  ?? Rheumatoid arthritis ([place]) 05/27/2021  ?? Chronic venous insufficiency 05/23/2021  ?? AVM (arteriovenous malformation) 04/04/2021  ?? CAD (coronary artery disease) 01/22/2021  ?? Primary osteoarthritis of both knees 01/12/2021  ?? Positive ANA (antinuclear antibody) 11/17/2020  ?? Joint pain 11/13/2020  ?? Elevated serum creatinine 08/26/2020  ?? Current use of proton pump inhibitor 08/26/2020  ?? Estrogen deficiency 08/21/2018  ?? TMJ (dislocation of temporomandibular joint), initial encounter 05/05/2018  ?? Anterior neck pain 05/05/2018  ?? Chronic pain syndrome 11/09/2017  ?? Chronic upper extremity pain [facility] Area of Pain) (Bilateral) (L>R) 11/09/2017  ?? Fibromyalgia syndrome 11/09/2017  ?? Osteoarthritis 11/09/2017  ?? Osteoarthritis of lumbar facet joint (Bilateral) 11/09/2017  ?? Lumbar facet arthropathy (Bilateral) 11/09/2017  ?? Lumbar facet syndrome (Bilateral) (L>R) 11/09/2017  ?? Lumbar foraminal stenosis (multilevel) (Bilateral) 11/09/2017  ?? DDD (degenerative  disc disease), lumbar 11/09/2017  ?? Thoracic levoscoliosis 11/09/2017  ?? Thoracic facet syndrome (Bilateral) (L>R) 11/09/2017  ?? Thoracic facet arthropathy (Bilateral) (R>L) 11/09/2017  ?? DDD (degenerative disc disease), thoracic 11/09/2017  ??  Thoracolumbar IVDD 11/09/2017  ?? DDD (degenerative disc disease), cervical 11/09/2017  ?? Osteoarthritis of cervical facet (Bilateral) 11/09/2017  ?? Grade 1 Anterolisthesis of C7 over T1 11/09/2017  ?? Cervical foraminal stenosis (Bilateral) 11/09/2017  ?? History of fusion of cervical spine (C5-6 ACDF) 11/09/2017  ?? Cervical facet syndrome (Bilateral) 11/09/2017  ?? Disorder of skeletal system 11/09/2017  ?? Cervical radiculitis (Bilateral) 11/09/2017  ?? Lumbar Epidural lipomatosis 11/09/2017  ?? Chronic sacroiliac joint pain (Bilateral) (L>R) 11/09/2017  ?? Chronic hip pain (Bilateral) (L>R) 11/09/2017  ?? Chronic upper back pain (Primary Area of Pain) (midline) 10/24/2017  ?? Chronic neck pain (Secondary Area of Pain) (Bilateral)  (L>R) 10/24/2017  ?? Chronic low back pain (Fourth Area of Pain) (Bilateral) (L>R) 10/24/2017  ?? Chronic lower extremity pain (Fifth Area of Pain) (Bilateral) (L>R) 10/24/2017  ?? Lumbar Grade 1 Retrolisthesis of L1-2 and L2-3 10/24/2017  ?? Other long term (current) drug therapy 10/24/2017  ?? Other specified health status 10/24/2017  ?? Long term current use of opiate analgesic 10/24/2017  ?? Long term prescription opiate use 10/24/2017  ?? Opiate use 10/24/2017  ?? DM type 2 (diabetes mellitus, type 2) ([place]) 06/14/2014  ?? Pharmacologic therapy 06/14/2014  ?? Pedal edema   ?? Post-menopausal 06/23/2012  ?? Lumbar disc disease with radiculopathy 11/18/2011  ?? HTN (hypertension) 07/30/2011  ?? Raynaud disease 07/30/2011  ?? Obesity 07/30/2011  ?? Routine general medical examination at a health care facility 03/28/2011  ?? Palpitations 04/27/2010  ?? UTI (urinary tract infection) 03/18/2010  ?? Depression with anxiety 06/14/2008  ?? Vitamin D deficiency 05/13/2008  ?? ANXIETY 03/25/2008  ?? Osteopenia 03/25/2008  ?? Hypothyroidism 03/28/2007  ?? Hyperlipidemia associated with type 2 diabetes mellitus ([place]) 03/28/2007  ?? Other iron deficiency anemias 03/28/2007  ?? PANIC ATTACK  03/28/2007  ?? KERATOCONJUNCTIVITIS SICCA 03/28/2007  ?? Mitral valve disorder 03/28/2007  ?? ABNORMAL HEART RHYTHMS 03/28/2007  ?? Raynaud's syndrome 03/28/2007  ?? GERD 03/28/2007  ?? IBS 03/28/2007  ?? Rosacea 03/28/2007  ?? PLANTAR FASCIITIS 03/28/2007  ?? MIGRAINES, HX OF 03/28/2007  ?  ?Past Medical History:  ?Diagnosis Date  ?? Acute renal insufficiency 05/31/2014  ? pt not aware of this diagnosis  ?? Anemia, iron deficiency   ?? Anxiety   ?? Bilateral lower extremity edema   ? a. uses torsemide  ?? Cervical dysplasia   ? abnormal paps  ?? Colon cancer screening 06/14/2014  ?? Coronary artery disease   ?? Cough 08/08/2012  ?? Degenerative disk disease 11/19/2011  ?? Depression   ?? Diabetes mellitus type II   ?? Diverticulosis   ?? DJD (degenerative joint disease)   ?? Drug rash 05/22/2011  ?? Dry eyes   ?? Dysrhythmia   ? metoprolol.  ?? Edema   ?? Elevated liver enzymes 03/21/2012  ?? Encounter for routine gynecological examination 06/14/2014  ?? ESOPHAGITIS 03/28/2007  ? Qualifier: Hospitalized for  By: Marcelino Scot CMA, [place] Bil[place]    ?? Fibromyalgia   ?? GASTRITIS 03/28/2007  ? Qualifier: History of  By: Marcelino Scot CMA, [place] Bilberry    ?? GERD (gastroesophageal reflux disease)   ?? Hemorrhoids   ? external  ?? HLD (hyperlipidemia)   ?? HNP (herniated nucleus pulposus) 11/1997  ? T6,7,8 with DJD  ?? HTN (hypertension)   ?? Hyperglycemia  05/13/2008  ? Qualifier: Diagnosis of  By: Glori Bickers MD, Carmell Austria   ?? Hypothyroidism   ?? Interstitial lung disease ([place])   ? pt not aware of this  ?? Left ovarian cyst   ? x 3, rupture  ?? Osteoarthritis   ? hands  ?? Osteopenia   ? mild-11/01; improved 12/05  ?? Other screening mammogram 08/18/2011  ?? Palpitations   ?? PERSONAL HISTORY ALLERGY UNSPEC MEDICINAL AGENT 03/18/2010  ? Qualifier: Diagnosis of  By: Tower MD, Carmell Austria   ?? PONV (postoperative nausea and vomiting)   ?? Raynaud's disease   ?? Recurrent HSV (herpes simplex virus)   ? lesions in nose or mouth with frequent  ulcers. d/t meds and dry mouth  ?? Rheumatoid arthritis ([place])   ?? Rhinitis 03/21/2012  ?? Routine general medical examination at a health care facility 03/28/2011  ?  ?Family History  ?Problem Relation Age of Onset

## 2022-01-20 NOTE — Addendum Note (Signed)
Addended by: Carole Binning on: 01/20/2022 02:10 PM ? ? Modules accepted: Orders ? ?

## 2022-01-20 NOTE — Addendum Note (Signed)
Addended by: Carole Binning on: 01/20/2022 02:18 PM ? ? Modules accepted: Orders ? ?

## 2022-01-21 ENCOUNTER — Other Ambulatory Visit: Payer: Self-pay | Admitting: Physician Assistant

## 2022-01-23 ENCOUNTER — Encounter: Payer: Self-pay | Admitting: Podiatry

## 2022-01-23 NOTE — Progress Notes (Signed)
?  Subjective:  ?Patient ID: Tina Berger, female    DOB: 07/01/1951,  MRN: [id number] ? ?Chief Complaint  ?Patient presents with  ? Hammer Toe  ?    CONSULTATION / REF BY DR. HYATT  ? ? ?71 y.o. female presents with the above complaint. History confirmed with patient.  She is referred by Dr. Milinda Pointer.  Her chief complaint is a painful second toe that crosses over the big toe causes pain in shoe gear.  She is a history of rheumatoid arthritis and she has working on getting this under better control she is currently on prednisone.  She says she has a second opinion on her rheumatoid arthritis management coming up at [facility] in May. ? ?Objective:  ?Physical Exam: ?warm, good capillary refill, no trophic changes or ulcerative lesions, normal DP and PT pulses, and normal sensory exam. ?Left Foot:  Flexible hammertoe deformities ?Right Foot:  Moderate hallux valgus deformity with second contracted hammertoe, pain under the second metatarsal phalangeal joint and slight crossover of the second toe ? ?No images are attached to the encounter. ? ?Radiographs: ?Multiple views x-ray of both feet: Reviewed her prior foot radiographs she has right foot hallux valgus and bilateral hammertoe deformities, there is minimal degenerative change noted to the joints in her plain film radiographs ?Assessment:  ? ?1. Hammer toe, unspecified laterality   ?2. Hallux valgus with bunions, right   ?3. Rheumatoid arthritis of multiple sites with negative rheumatoid factor ([place])   ?4. Fibromyalgia syndrome   ? ? ? ?Plan:  ?Patient was evaluated and treated and all questions answered. ? ?I reviewed her foot pain her radiographs with her in detail in the room today.  We discussed hallux valgus deformity and hammertoe deformities form and often a more severe patient with rheumatoid arthritis.  I do think significant amount of her joint pain is due to her rheumatoid arthritis she has pain on the plantar portions of the joint.  There is no gross  instability.  I discussed with her that often end-stage rheumatoid arthritis when it is not well controlled often leads to destructive changes to the lesser joints often necessitating a first metatarsophalangeal joint arthrodesis and lesser metatarsal head resection.  I discussed with her that I do not think this would be necessary in her at this point.  I do think it be important to have her off her prednisone prior to proceeding with any sort of surgery.  She has an upcoming second opinion in May with [facility] and I would like to see her back after she has completed this.  I think she could be a candidate for minimally invasive bunionectomy and second hammertoe correction with Weil osteotomy and flexor tendon transfer.  We discussed the risk benefits and potential complications of this as well as the expected postoperative course.  I will see her back in 6 weeks for follow-up. ? ?Return in about 6 weeks (around 03/03/2022) for discuss foot surgery again after new rheumatologist.  ? ?

## 2022-01-28 ENCOUNTER — Other Ambulatory Visit: Payer: Self-pay | Admitting: Rheumatology

## 2022-01-28 ENCOUNTER — Other Ambulatory Visit: Payer: Self-pay

## 2022-01-28 MED ORDER — PREDNISONE 5 MG PO TABS
ORAL_TABLET | ORAL | 0 refills | Status: DC
  Filled 2022-01-28: qty 30, 30d supply, fill #0

## 2022-01-28 MED ORDER — PREDNISONE 5 MG PO TABS: ORAL_TABLET | ORAL | 0 refills | Status: DC

## 2022-01-28 NOTE — Addendum Note (Signed)
Addended by: Carole Binning on: 01/28/2022 01:22 PM ? ? Modules accepted: Orders ? ?

## 2022-01-28 NOTE — Telephone Encounter (Signed)
Next Visit: 02/03/2022 ?  ?Last Visit: 11/19/2021 ?  ?Last Fill: 01/20/2022 ?  ?Dx:  Rheumatoid arthritis of multiple sites with negative rheumatoid factor  ?  ?Current Dose per phone note on 01/19/2022: She may stay on prednisone 6 mg p.o. daily until her follow-up appointment ? ?Okay to refill Prednisone?  ?

## 2022-02-03 ENCOUNTER — Ambulatory Visit: Payer: Medicare Other | Admitting: Physician Assistant

## 2022-02-03 ENCOUNTER — Encounter: Payer: Self-pay | Admitting: Physician Assistant

## 2022-02-03 ENCOUNTER — Ambulatory Visit (INDEPENDENT_AMBULATORY_CARE_PROVIDER_SITE_OTHER): Payer: Medicare Other

## 2022-02-03 ENCOUNTER — Other Ambulatory Visit: Payer: Self-pay

## 2022-02-03 VITALS — BP 136/87 | HR 71 | Ht 63.0 in | Wt 188.4 lb

## 2022-02-03 DIAGNOSIS — I10 Essential (primary) hypertension: Secondary | ICD-10-CM

## 2022-02-03 DIAGNOSIS — I83029 Varicose veins of left lower extremity with ulcer of unspecified site: Secondary | ICD-10-CM

## 2022-02-03 DIAGNOSIS — L97929 Non-pressure chronic ulcer of unspecified part of left lower leg with unspecified severity: Secondary | ICD-10-CM

## 2022-02-03 DIAGNOSIS — M5030 Other cervical disc degeneration, unspecified cervical region: Secondary | ICD-10-CM

## 2022-02-03 DIAGNOSIS — E1169 Type 2 diabetes mellitus with other specified complication: Secondary | ICD-10-CM

## 2022-02-03 DIAGNOSIS — J849 Interstitial pulmonary disease, unspecified: Secondary | ICD-10-CM | POA: Diagnosis not present

## 2022-02-03 DIAGNOSIS — M170 Bilateral primary osteoarthritis of knee: Secondary | ICD-10-CM | POA: Diagnosis not present

## 2022-02-03 DIAGNOSIS — Z111 Encounter for screening for respiratory tuberculosis: Secondary | ICD-10-CM

## 2022-02-03 DIAGNOSIS — Z8639 Personal history of other endocrine, nutritional and metabolic disease: Secondary | ICD-10-CM

## 2022-02-03 DIAGNOSIS — Z981 Arthrodesis status: Secondary | ICD-10-CM

## 2022-02-03 DIAGNOSIS — M5134 Other intervertebral disc degeneration, thoracic region: Secondary | ICD-10-CM | POA: Diagnosis not present

## 2022-02-03 DIAGNOSIS — M5136 Other intervertebral disc degeneration, lumbar region: Secondary | ICD-10-CM | POA: Diagnosis not present

## 2022-02-03 DIAGNOSIS — Z8774 Personal history of (corrected) congenital malformations of heart and circulatory system: Secondary | ICD-10-CM | POA: Diagnosis not present

## 2022-02-03 DIAGNOSIS — I7300 Raynaud's syndrome without gangrene: Secondary | ICD-10-CM

## 2022-02-03 DIAGNOSIS — M0609 Rheumatoid arthritis without rheumatoid factor, multiple sites: Secondary | ICD-10-CM | POA: Diagnosis not present

## 2022-02-03 DIAGNOSIS — Z79891 Long term (current) use of opiate analgesic: Secondary | ICD-10-CM

## 2022-02-03 DIAGNOSIS — Z8719 Personal history of other diseases of the digestive system: Secondary | ICD-10-CM

## 2022-02-03 DIAGNOSIS — F418 Other specified anxiety disorders: Secondary | ICD-10-CM

## 2022-02-03 DIAGNOSIS — I059 Rheumatic mitral valve disease, unspecified: Secondary | ICD-10-CM

## 2022-02-03 DIAGNOSIS — M25512 Pain in left shoulder: Secondary | ICD-10-CM

## 2022-02-03 DIAGNOSIS — E785 Hyperlipidemia, unspecified: Secondary | ICD-10-CM

## 2022-02-03 DIAGNOSIS — Z79899 Other long term (current) drug therapy: Secondary | ICD-10-CM | POA: Diagnosis not present

## 2022-02-03 DIAGNOSIS — E119 Type 2 diabetes mellitus without complications: Secondary | ICD-10-CM

## 2022-02-03 DIAGNOSIS — N1831 Chronic kidney disease, stage 3a: Secondary | ICD-10-CM

## 2022-02-03 DIAGNOSIS — M797 Fibromyalgia: Secondary | ICD-10-CM

## 2022-02-03 DIAGNOSIS — G8929 Other chronic pain: Secondary | ICD-10-CM | POA: Diagnosis not present

## 2022-02-03 DIAGNOSIS — G894 Chronic pain syndrome: Secondary | ICD-10-CM

## 2022-02-03 MED ORDER — LIDOCAINE HCL 1 % IJ SOLN
1.5000 mL | INTRAMUSCULAR | Status: AC | PRN
  Administered 2022-02-03: 1.5 mL

## 2022-02-03 MED ORDER — TRIAMCINOLONE ACETONIDE 40 MG/ML IJ SUSP
40.0000 mg | INTRAMUSCULAR | Status: AC | PRN
  Administered 2022-02-03: 40 mg via INTRA_ARTICULAR

## 2022-02-03 NOTE — Telephone Encounter (Signed)
Patient will be starting [place] pending lab results, per Hazel Sams, PA-C. Thanks!  ? ?Consent obtained and sent to the scan center.  ?

## 2022-02-03 NOTE — Patient Instructions (Addendum)
Standing Labs ?We placed an order today for your standing lab work.  ? ?Please have your standing labs drawn in 2 weeks x2, 2 months, and every 3 months ? ?If possible, please have your labs drawn 2 weeks prior to your appointment so that the provider can discuss your results at your appointment. ? ?Please note that you may see your imaging and lab results in [facility] before we have reviewed them. ?We may be awaiting multiple results to interpret others before contacting you. ?Please allow our office up to 72 hours to thoroughly review all of the results before contacting the office for clarification of your results. ? ?We have open lab daily: ?Monday through Thursday from 1:30-4:30 PM and Friday from 1:30-4:00 PM ?at the office of Dr. Bo Merino, [facility].   ?Please be advised, all patients with office appointments requiring lab work will take precedent over walk-in lab work.  ?If possible, please come for your lab work on Monday and Friday afternoons, as you may experience shorter wait times. ?The office is located at [address], [place], [place], [place] [address] ?No appointment is necessary.   ?Labs are drawn by [facility]. Please bring your co-pay at the time of your lab draw.  You may receive a bill from [facility] for your lab work. ? ?Please note if you are on Hydroxychloroquine and and an order has been placed for a Hydroxychloroquine level, you will need to have it drawn 4 hours or more after your last dose. ? ?If you wish to have your labs drawn at another location, please call the office 24 hours in advance to send orders. ? ?If you have any questions regarding directions or hours of operation,  ?please call [phone].   ?As a reminder, please drink plenty of water prior to coming for your lab work. Thanks! ? ?Leflunomide tablets ?What is this medication? ?LEFLUNOMIDE (le FLOO na mide) is for rheumatoid arthritis. ?This medicine may be used for other purposes; ask your health care  provider or pharmacist if you have questions. ?COMMON BRAND NAME(S): Arava ?What should I tell my care team before I take this medication? ?They need to know if you have any of these conditions: ?diabetes ?have a fever or infection ?high blood pressure ?immune system problems ?kidney disease ?liver disease ?low blood cell counts, like low white cell, platelet, or red cell counts ?lung or breathing disease, like asthma ?recently received or scheduled to receive a vaccine ?receiving treatment for cancer ?skin conditions or sensitivity ?tingling of the fingers or toes, or other nerve disorder ?tuberculosis ?an unusual or allergic reaction to leflunomide, teriflunomide, other medicines, food, dyes, or preservatives ?pregnant or trying to get pregnant ?breast-feeding ?How should I use this medication? ?Take this medicine by mouth with a full glass of water. Follow the directions on the prescription label. Take your medicine at regular intervals. Do not take your medicine more often than directed. Do not stop taking except on your doctor's advice. ?Talk to your pediatrician regarding the use of this medicine in children. Special care may be needed. ?Overdosage: If you think you have taken too much of this medicine contact a poison control center or emergency room at once. ?NOTE: This medicine is only for you. Do not share this medicine with others. ?What if I miss a dose? ?If you miss a dose, take it as soon as you can. If it is almost time for your next dose, take only that dose. Do not take double or extra  doses. ?What may interact with this medication? ?Do not take this medicine with any of the following medications: ?teriflunomide ?This medicine may also interact with the following medications: ?alosetron ?birth control pills ?caffeine ?cefaclor ?certain medicines for diabetes like nateglinide, repaglinide, rosiglitazone, pioglitazone ?certain medicines for high cholesterol like atorvastatin, pravastatin,  rosuvastatin, simvastatin ?charcoal ?cholestyramine ?ciprofloxacin ?duloxetine ?furosemide ?ketoprofen ?live virus vaccines ?medicines that increase your risk for infection ?methotrexate ?mitoxantrone ?paclitaxel ?penicillin ?theophylline ?tizanidine ?warfarin ?This list may not describe all possible interactions. Give your health care provider a list of all the medicines, herbs, non-prescription drugs, or dietary supplements you use. Also tell them if you smoke, drink alcohol, or use illegal drugs. Some items may interact with your medicine. ?What should I watch for while using this medication? ?Visit your health care provider for regular checks on your progress. Tell your doctor or health care provider if your symptoms do not start to get better or if they get worse. You may need blood work done while you are taking this medicine. ?This medicine may cause serious skin reactions. They can happen weeks to months after starting the medicine. Contact your health care provider right away if you notice fevers or flu-like symptoms with a rash. The rash may be red or purple and then turn into blisters or peeling of the skin. Or, you might notice a red rash with swelling of the face, lips or lymph nodes in your neck or under your arms. ?This medicine may stay in your body for up to 2 years after your last dose. Tell your doctor about any unusual side effects or symptoms. A medicine can be given to help lower your blood levels of this medicine more quickly. ?Women must use effective birth control with this medicine. There is a potential for serious side effects to an unborn child. Do not become pregnant while taking this medicine. Inform your doctor if you wish to become pregnant. This medicine remains in your blood after you stop taking it. You must continue using effective birth control until the blood levels have been checked and they are low enough. A medicine can be given to help lower your blood levels of this  medicine more quickly. Immediately talk to your doctor if you think you may be pregnant. You may need a pregnancy test. Talk to your health care provider or pharmacist for more information. ?You should not receive certain vaccines during your treatment and for a certain time after your treatment with this medication ends. Talk to your health care provider for more information. ?What side effects may I notice from receiving this medication? ?Side effects that you should report to your doctor or health care professional as soon as possible: ?allergic reactions like skin rash, itching or hives, swelling of the face, lips, or tongue ?breathing problems ?cough ?increased blood pressure ?low blood counts - this medicine may decrease the number of white blood cells and platelets. You may be at increased risk for infections and bleeding. ?pain, tingling, numbness in the hands or feet ?rash, fever, and swollen lymph nodes ?redness, blistering, peeing or loosening of the skin, including inside the mouth ?signs of decreased platelets or bleeding - bruising, pinpoint red spots on the skin, black, tarry stools, blood in urine ?signs of infection - fever or chills, cough, sore throat, pain or trouble passing urine ?signs and symptoms of liver injury like dark yellow or brown urine; general ill feeling or flu-like symptoms; light-colored stools; loss of appetite; nausea; right upper belly pain; unusually  weak or tired; yellowing of the eyes or skin ?trouble passing urine or change in the amount of urine ?vomiting ?Side effects that usually do not require medical attention (report to your doctor or health care professional if they continue or are bothersome): ?diarrhea ?hair thinning or loss ?headache ?nausea ?tiredness ?This list may not describe all possible side effects. Call your doctor for medical advice about side effects. You may report side effects to FDA at 1-800-FDA-1088. ?Where should I keep my medication? ?Keep out of  the reach of children. ?Store at room temperature between 15 and 30 degrees C (59 and 86 degrees F). Protect from moisture and light. Throw away any unused medicine after the expiration date. ?NOTE: This sheet is a summary

## 2022-02-04 MED ORDER — LEFLUNOMIDE 10 MG PO TABS: ORAL_TABLET | ORAL | 0 refills | Status: DC

## 2022-02-04 NOTE — Progress Notes (Signed)
Glucose is 122. Rest of CMP WNL.  CBC WNL.  ESR WNL.

## 2022-02-04 NOTE — Telephone Encounter (Signed)
Per office note 02/03/2022: will be starting on arava 10 mg daily x2 weeks and if labs are stable she will increase to 20 mg daily.   ?

## 2022-02-05 ENCOUNTER — Ambulatory Visit: Payer: Medicare Other

## 2022-02-05 DIAGNOSIS — M2011 Hallux valgus (acquired), right foot: Secondary | ICD-10-CM

## 2022-02-05 DIAGNOSIS — E119 Type 2 diabetes mellitus without complications: Secondary | ICD-10-CM

## 2022-02-05 DIAGNOSIS — M2040 Other hammer toe(s) (acquired), unspecified foot: Secondary | ICD-10-CM

## 2022-02-05 LAB — QUANTIFERON-TB GOLD PLUS
Mitogen-NIL: 7.54 IU/mL
NIL: 0.01 IU/mL
QuantiFERON-TB Gold Plus: NEGATIVE
TB1-NIL: 0 IU/mL
TB2-NIL: 0 IU/mL

## 2022-02-05 LAB — COMPLETE METABOLIC PANEL WITH GFR
AG Ratio: 1.5 (calc) (ref 1.0–2.5)
ALT: 20 U/L (ref 6–29)
AST: 18 U/L (ref 10–35)
Albumin: 4.6 g/dL (ref 3.6–5.1)
Alkaline phosphatase (APISO): 64 U/L (ref 37–153)
BUN: 25 mg/dL (ref 7–25)
CO2: 28 mmol/L (ref 20–32)
Calcium: 9.4 mg/dL (ref 8.6–10.4)
Chloride: 99 mmol/L (ref 98–110)
Creat: 0.97 mg/dL (ref 0.60–1.00)
Globulin: 3 g/dL (calc) (ref 1.9–3.7)
Glucose, Bld: 122 mg/dL — ABNORMAL HIGH (ref 65–99)
Potassium: 4.6 mmol/L (ref 3.5–5.3)
Sodium: 138 mmol/L (ref 135–146)
Total Bilirubin: 0.5 mg/dL (ref 0.2–1.2)
Total Protein: 7.6 g/dL (ref 6.1–8.1)
eGFR: 62 mL/min/{1.73_m2} (ref >=60)

## 2022-02-05 LAB — CBC WITH DIFFERENTIAL/PLATELET
Absolute Monocytes: 612 cells/uL (ref 200–950)
Basophils Absolute: 72 cells/uL (ref 0–200)
Basophils Relative: 0.8 %
Eosinophils Absolute: 153 cells/uL (ref 15–500)
Eosinophils Relative: 1.7 %
HCT: 42.7 % (ref 35.0–45.0)
Hemoglobin: 14.2 g/dL (ref 11.7–15.5)
Lymphs Abs: 1611 cells/uL (ref 850–3900)
MCH: 29.9 pg (ref 27.0–33.0)
MCHC: 33.3 g/dL (ref 32.0–36.0)
MCV: 89.9 fL (ref 80.0–100.0)
MPV: 9.5 fL (ref 7.5–12.5)
Monocytes Relative: 6.8 %
Neutro Abs: 6552 cells/uL (ref 1500–7800)
Neutrophils Relative %: 72.8 %
Platelets: 339 10*3/uL (ref 140–400)
RBC: 4.75 10*6/uL (ref 3.80–5.10)
RDW: 12.5 % (ref 11.0–15.0)
Total Lymphocyte: 17.9 %
WBC: 9 10*3/uL (ref 3.8–10.8)

## 2022-02-05 LAB — SEDIMENTATION RATE: Sed Rate: 6 mm/h (ref 0–30)

## 2022-02-05 NOTE — Progress Notes (Signed)
SITUATION ?Reason for Consult: Evaluation for Prefabricated Diabetic Shoes and Custom Diabetic Inserts. ?Patient / Caregiver Report: Patient would like well fitting shoes ? ?OBJECTIVE DATA: ?Patient History / Diagnosis:  ?  ICD-10-CM   ?1. Type 2 diabetes mellitus without complication, without long-term current use of insulin (HCC)  E11.9   ?  ?2. Hammer toe, unspecified laterality  M20.40   ?  ?3. Hallux valgus with bunions, right  M20.11   ? M21.611   ?  ? ? ?Physician Treating Diabetes:  Abner Greenspan, MD ? ?Current or Previous Devices:   None and no history ? ?In-Person Foot Examination: ?Ulcers & Callousing:   None ?Deformities:    Hammertoes, hallux valgus ?Sensation:    Compromised  ?Shoe Size:     9XW ? ?ORTHOTIC RECOMMENDATION ?Recommended Devices: ?- 1x pair prefabricated PDAC approved diabetic shoes; Patient Selected Orthofeet [employer]  ? Size 9XW ?- 3x pair custom-to-patient PDAC approved vacuum formed diabetic insoles. ? ?GOALS OF SHOES AND INSOLES ?- Reduce shear and pressure ?- Reduce / Prevent callus formation ?- Reduce / Prevent ulceration ?- Protect the fragile healing compromised diabetic foot. ? ?Patient would benefit from diabetic shoes and inserts as patient has diabetes mellitus and the patient has one or more of the following conditions: ?- History of partial or complete amputation of the foot ?- History of previous foot ulceration. ?- History of pre-ulcerative callus ?- Peripheral neuropathy with evidence of callus formation ?- Foot deformity ?- Poor circulation ? ?ACTIONS PERFORMED ?Potential out of pocket cost was communicated to patient. Patient understood and consented to measurement and casting. Patient was casted for insoles via crush box and measured for shoes via brannock device. Procedure was explained and patient tolerated procedure well. All questions were answered and concerns addressed. Casts were shipped to central fabrication for HOLD until Certificate of Medical Necessity or  otherwise necessary authorization from insurance is obtained. ? ?PLAN ?Shoes are to be ordered and casts released from hold once all appropriate paperwork is complete. Patient is to be contacted and scheduled for fitting once shoes and insoles have been fabricated and received. ? ?

## 2022-02-08 ENCOUNTER — Ambulatory Visit: Payer: Medicare Other | Admitting: Internal Medicine

## 2022-02-08 NOTE — Progress Notes (Signed)
TB gold negative

## 2022-02-09 ENCOUNTER — Encounter: Payer: Self-pay | Admitting: Family Medicine

## 2022-02-09 ENCOUNTER — Other Ambulatory Visit: Payer: Self-pay | Admitting: Family Medicine

## 2022-02-09 MED ORDER — ROSUVASTATIN CALCIUM 5 MG PO TABS: ORAL_TABLET | ORAL | 0 refills | Status: DC

## 2022-02-09 MED ORDER — RAMIPRIL 5 MG PO CAPS: 5.0000 mg | ORAL_CAPSULE | Freq: Every day | ORAL | 0 refills | Status: DC

## 2022-02-09 MED ORDER — BUPROPION HCL ER (XL) 150 MG PO TB24: 150.0000 mg | ORAL_TABLET | Freq: Every day | ORAL | 0 refills | Status: DC

## 2022-02-09 MED ORDER — METOPROLOL SUCCINATE ER 50 MG PO TB24: ORAL_TABLET | ORAL | 0 refills | Status: DC

## 2022-02-09 MED ORDER — LEVOTHYROXINE SODIUM 150 MCG PO TABS
150.0000 ug | ORAL_TABLET | Freq: Every day | ORAL | 0 refills | Status: DC
Start: 2022-02-09 — End: 2022-03-24

## 2022-02-09 MED ORDER — ESOMEPRAZOLE MAGNESIUM 20 MG PO CPDR: 20.0000 mg | DELAYED_RELEASE_CAPSULE | Freq: Every day | ORAL | 0 refills | Status: DC

## 2022-02-09 MED ORDER — FAMOTIDINE 40 MG PO TABS: 40.0000 mg | ORAL_TABLET | Freq: Every day | ORAL | 0 refills | Status: DC

## 2022-02-09 NOTE — Telephone Encounter (Signed)
See refill request, pt hasn't been seen since 11/13/20 and no future appts  ?

## 2022-02-09 NOTE — Telephone Encounter (Signed)
I refilled times one  ?Please schedule amw and annual exam  ? ?If she declines that, just a follow up  ? ? ?

## 2022-02-09 NOTE — Telephone Encounter (Signed)
See mychart message also, pt hasn't been seen since 11/13/20 and no future appts, please advise  ?

## 2022-02-11 MED ORDER — ROSUVASTATIN CALCIUM 5 MG PO TABS: ORAL_TABLET | ORAL | 0 refills | Status: DC

## 2022-02-11 NOTE — Telephone Encounter (Signed)
Per PCP med list updated and f/u appt scheduled (pt declined CPE) ?

## 2022-02-16 ENCOUNTER — Other Ambulatory Visit: Payer: Self-pay | Admitting: Rheumatology

## 2022-02-16 NOTE — Telephone Encounter (Signed)
Next Visit: 03/29/2022 ? ?Last Visit: 02/03/2022 ? ?Last Fill: 01/20/2022 ? ?Dx: Rheumatoid arthritis of multiple sites with negative rheumatoid factor  ? ?Current Dose per office note on 02/03/2022: The goal will be to try to taper prednisone by 1 mg every 1 month once her symptoms have improved.  ? ?Spoke with and she states she is taking Prednisone 6 mg.  ? ?Okay to refill Prednisone?   ?

## 2022-02-18 ENCOUNTER — Other Ambulatory Visit: Payer: Self-pay

## 2022-02-19 MED ORDER — HYOSCYAMINE SULFATE 0.125 MG SL SUBL: 0.1250 mg | SUBLINGUAL_TABLET | SUBLINGUAL | 0 refills | Status: DC | PRN

## 2022-02-19 NOTE — Telephone Encounter (Signed)
Med refilled appt scheduled on 02/24/22, last filled on 06/23/21 #270 tabs/ 1 refill  ?

## 2022-02-19 NOTE — Addendum Note (Signed)
Addended by: Loura Pardon A on: 02/19/2022 01:40 PM ? ? Modules accepted: Orders ? ?

## 2022-02-19 NOTE — Addendum Note (Signed)
Addended by: Tammi Sou on: 02/19/2022 10:19 AM ? ? Modules accepted: Orders ? ?

## 2022-02-24 ENCOUNTER — Ambulatory Visit: Payer: Medicare Other | Admitting: Family Medicine

## 2022-02-26 ENCOUNTER — Telehealth: Payer: Self-pay

## 2022-02-26 NOTE — Telephone Encounter (Signed)
CMN Submitted ?

## 2022-03-02 ENCOUNTER — Other Ambulatory Visit: Payer: Self-pay | Admitting: *Deleted

## 2022-03-02 ENCOUNTER — Telehealth: Payer: Self-pay | Admitting: *Deleted

## 2022-03-02 ENCOUNTER — Other Ambulatory Visit: Payer: Self-pay | Admitting: Physician Assistant

## 2022-03-02 DIAGNOSIS — Z79899 Other long term (current) drug therapy: Secondary | ICD-10-CM

## 2022-03-02 NOTE — Telephone Encounter (Signed)
Spoke with patient and she states sent starting the [place], she has been having increased pain in her hands which goes into her shoulders. Patient states her ankles are weak and sore. Patient states she is also having pain in her feet. Patient has noticed some mild swelling in her hands. Patient states she is having trouble laying on her sides due to pain. Please advise.  ?

## 2022-03-02 NOTE — Telephone Encounter (Signed)
I returned patient's call.  She states she has been having increased pain and stiffness in her ankles in the morning.  She states that she used to turn and toss all night in the bed but recently she has been sleeping through the night.  I told her that it is a good indication that her symptoms are better controlled.  She was in agreement to give [place] more time to work.

## 2022-03-02 NOTE — Telephone Encounter (Signed)
Received fax from [facility] saying they need clarification on pt's Metoprolol Rx. ? ?The directions say:  Take with or immediately following a meal. ? ?But pt was prescribed #180 tabs. ? ? ?They advised new Rx indicating the dose and frequency is needed in order for them to refill med ?

## 2022-03-03 MED ORDER — METOPROLOL SUCCINATE ER 50 MG PO TB24: 50.0000 mg | ORAL_TABLET | Freq: Two times a day (BID) | ORAL | 0 refills | Status: DC

## 2022-03-03 NOTE — Telephone Encounter (Signed)
Pt is taking med BID, Rx resent correctly  ?

## 2022-03-04 DIAGNOSIS — Z79899 Other long term (current) drug therapy: Secondary | ICD-10-CM | POA: Diagnosis not present

## 2022-03-04 DIAGNOSIS — H40003 Preglaucoma, unspecified, bilateral: Secondary | ICD-10-CM | POA: Diagnosis not present

## 2022-03-04 DIAGNOSIS — E119 Type 2 diabetes mellitus without complications: Secondary | ICD-10-CM | POA: Diagnosis not present

## 2022-03-04 DIAGNOSIS — H2513 Age-related nuclear cataract, bilateral: Secondary | ICD-10-CM | POA: Diagnosis not present

## 2022-03-04 LAB — HM DIABETES EYE EXAM

## 2022-03-05 LAB — CMP14+EGFR
ALT: 23 IU/L (ref 0–32)
AST: 25 IU/L (ref 0–40)
Albumin/Globulin Ratio: 1.5 (ref 1.2–2.2)
Albumin: 4.6 g/dL (ref 3.7–4.7)
Alkaline Phosphatase: 67 IU/L (ref 44–121)
BUN/Creatinine Ratio: 20 (ref 12–28)
BUN: 19 mg/dL (ref 8–27)
Bilirubin Total: 0.2 mg/dL (ref 0.0–1.2)
CO2: 25 mmol/L (ref 20–29)
Calcium: 9.5 mg/dL (ref 8.7–10.3)
Chloride: 98 mmol/L (ref 96–106)
Creatinine, Ser: 0.95 mg/dL (ref 0.57–1.00)
Globulin, Total: 3 g/dL (ref 1.5–4.5)
Glucose: 135 mg/dL — ABNORMAL HIGH (ref 70–99)
Potassium: 4.7 mmol/L (ref 3.5–5.2)
Sodium: 140 mmol/L (ref 134–144)
Total Protein: 7.6 g/dL (ref 6.0–8.5)
eGFR: 64 mL/min/{1.73_m2} (ref >59)

## 2022-03-05 LAB — CBC WITH DIFFERENTIAL/PLATELET
Basophils Absolute: 0.1 10*3/uL (ref 0.0–0.2)
Basos: 1 %
EOS (ABSOLUTE): 0.2 10*3/uL (ref 0.0–0.4)
Eos: 3 %
Hematocrit: 43.6 % (ref 34.0–46.6)
Hemoglobin: 14.5 g/dL (ref 11.1–15.9)
Immature Grans (Abs): 0 10*3/uL (ref 0.0–0.1)
Immature Granulocytes: 0 %
Lymphocytes Absolute: 1.5 10*3/uL (ref 0.7–3.1)
Lymphs: 21 %
MCH: 30 pg (ref 26.6–33.0)
MCHC: 33.3 g/dL (ref 31.5–35.7)
MCV: 90 fL (ref 79–97)
Monocytes Absolute: 0.7 10*3/uL (ref 0.1–0.9)
Monocytes: 10 %
Neutrophils Absolute: 4.6 10*3/uL (ref 1.4–7.0)
Neutrophils: 65 %
Platelets: 289 10*3/uL (ref 150–450)
RBC: 4.84 x10E6/uL (ref 3.77–5.28)
RDW: 11.8 % (ref 11.7–15.4)
WBC: 7 10*3/uL (ref 3.4–10.8)

## 2022-03-05 NOTE — Progress Notes (Signed)
CBC and CMP are normal.  Glucose is elevated

## 2022-03-11 ENCOUNTER — Ambulatory Visit: Payer: Medicare Other | Admitting: Family Medicine

## 2022-03-15 ENCOUNTER — Other Ambulatory Visit: Payer: Self-pay | Admitting: Physician Assistant

## 2022-03-16 NOTE — Telephone Encounter (Signed)
Next Visit: 03/29/2022   Last Visit: 02/03/2022   Last Fill: 02/16/2022   Dx: Rheumatoid arthritis of multiple sites with negative rheumatoid factor    Current Dose per office note on 02/03/2022: The goal will be to try to taper prednisone by 1 mg every 1 month once her symptoms have improved.    Spoke with and she states she is taking Prednisone 6 mg.    Okay to refill Prednisone?

## 2022-03-16 NOTE — Progress Notes (Signed)
Office Visit Note  Patient: Tina Berger             Date of Birth: Jun 10, 1951           MRN: [id number]             PCP: Abner Greenspan, MD Referring: Tower, [name], MD Visit Date: 03/29/2022 Occupation: '@GUAROCC'$ @  Subjective:  Increased joint pain and swelling.  History of Present Illness: Tina Berger is a 71 y.o. female with history of seronegative rheumatoid arthritis, ILD and osteoarthritis.  She was placed on February 03, 2022 on leflunomide 10 mg p.o. daily due to ongoing joint pain and swelling.  Patient states that she started having increased swelling after taking leflunomide.  She also had mood changes and she was crying all the time.  She stopped leflunomide since then her mood changes resolved.  She continues to have some swelling in her hands.  She is currently on prednisone 6 mg p.o. daily.  She has been tolerating Actemra without any side effects.  She has noticed improvement in her breathing.  She also had a recent visit with Dr. Chase Caller.  According to his note ILD is mild.  He also mention very mild progression from 2013-2022.  He recommended repeating DLCO in 6 months.  She continues to have Raynaud's phenomenon and fatigue.   Activities of Daily Living:  Patient reports morning stiffness for 4 hours.   Patient Reports nocturnal pain.  Difficulty dressing/grooming: Reports Difficulty climbing stairs: Reports Difficulty getting out of chair: Reports Difficulty using hands for taps, buttons, cutlery, and/or writing: Reports  Review of Systems  Constitutional:  Positive for fatigue.  HENT:  Positive for mouth dryness.   Eyes:  Positive for dryness.  Respiratory:  Negative for shortness of breath.   Cardiovascular:  Positive for swelling in legs/feet.  Gastrointestinal:  Positive for constipation.  Endocrine: Positive for cold intolerance, heat intolerance and excessive thirst.  Genitourinary:  Negative for difficulty urinating.  Musculoskeletal:  Positive for  joint pain, gait problem, joint pain, joint swelling, muscle weakness, morning stiffness and muscle tenderness.  Skin:  Negative for color change, rash and sensitivity to sunlight.  Allergic/Immunologic: Negative for susceptible to infections.  Neurological:  Positive for numbness and weakness.  Hematological:  Positive for bruising/bleeding tendency.  Psychiatric/Behavioral:  Positive for sleep disturbance.     PMFS History:  Patient Active Problem List   Diagnosis Date Noted   Dysuria 09/07/2021   Rheumatoid arthritis ([place]) 05/27/2021   Chronic venous insufficiency 05/23/2021   AVM (arteriovenous malformation) 04/04/2021   CAD (coronary artery disease) 01/22/2021   Primary osteoarthritis of both knees 01/12/2021   Positive ANA (antinuclear antibody) 11/17/2020   Joint pain 11/13/2020   Elevated serum creatinine 08/26/2020   Current use of proton pump inhibitor 08/26/2020   Estrogen deficiency 08/21/2018   TMJ (dislocation of temporomandibular joint), initial encounter 05/05/2018   Anterior neck pain 05/05/2018   Chronic pain syndrome 11/09/2017   Chronic upper extremity pain [facility]) (Bilateral) (L>R) 11/09/2017   Fibromyalgia syndrome 11/09/2017   Osteoarthritis 11/09/2017   Osteoarthritis of lumbar facet joint (Bilateral) 11/09/2017   Lumbar facet arthropathy (Bilateral) 11/09/2017   Lumbar facet syndrome (Bilateral) (L>R) 11/09/2017   Lumbar foraminal stenosis (multilevel) (Bilateral) 11/09/2017   DDD (degenerative disc disease), lumbar 11/09/2017   Thoracic levoscoliosis 11/09/2017   Thoracic facet syndrome (Bilateral) (L>R) 11/09/2017   Thoracic facet arthropathy (Bilateral) (R>L) 11/09/2017   DDD (degenerative disc disease), thoracic  11/09/2017   Thoracolumbar IVDD 11/09/2017   DDD (degenerative disc disease), cervical 11/09/2017   Osteoarthritis of cervical facet (Bilateral) 11/09/2017   Grade 1 Anterolisthesis of C7 over T1 11/09/2017   Cervical  foraminal stenosis (Bilateral) 11/09/2017   History of fusion of cervical spine (C5-6 ACDF) 11/09/2017   Cervical facet syndrome (Bilateral) 11/09/2017   Disorder of skeletal system 11/09/2017   Cervical radiculitis (Bilateral) 11/09/2017   Lumbar Epidural lipomatosis 11/09/2017   Chronic sacroiliac joint pain (Bilateral) (L>R) 11/09/2017   Chronic hip pain (Bilateral) (L>R) 11/09/2017   Chronic upper back pain (Primary Area of Pain) (midline) 10/24/2017   Chronic neck pain (Secondary Area of Pain) (Bilateral)  (L>R) 10/24/2017   Chronic low back pain (Fourth Area of Pain) (Bilateral) (L>R) 10/24/2017   Chronic lower extremity pain (Fifth Area of Pain) (Bilateral) (L>R) 10/24/2017   Lumbar Grade 1 Retrolisthesis of L1-2 and L2-3 10/24/2017   Other long term (current) drug therapy 10/24/2017   Other specified health status 10/24/2017   Long term current use of opiate analgesic 10/24/2017   Long term prescription opiate use 10/24/2017   Opiate use 10/24/2017   DM type 2 (diabetes mellitus, type 2) ([place]) 06/14/2014   Pharmacologic therapy 06/14/2014   Pedal edema    Post-menopausal 06/23/2012   Lumbar disc disease with radiculopathy 11/18/2011   HTN (hypertension) 07/30/2011   Raynaud disease 07/30/2011   Obesity 07/30/2011   Routine general medical examination at a health care facility 03/28/2011   Palpitations 04/27/2010   UTI (urinary tract infection) 03/18/2010   Depression with anxiety 06/14/2008   Vitamin D deficiency 05/13/2008   ANXIETY 03/25/2008   Osteopenia 03/25/2008   Hypothyroidism 03/28/2007   Hyperlipidemia associated with type 2 diabetes mellitus ([place]) 03/28/2007   Other iron deficiency anemias 03/28/2007   PANIC ATTACK 03/28/2007   KERATOCONJUNCTIVITIS SICCA 03/28/2007   Mitral valve disorder 03/28/2007   ABNORMAL HEART RHYTHMS 03/28/2007   Raynaud's syndrome 03/28/2007   GERD 03/28/2007   IBS 03/28/2007   Rosacea 03/28/2007   PLANTAR FASCIITIS 03/28/2007    MIGRAINES, HX OF 03/28/2007    Past Medical History:  Diagnosis Date   Acute renal insufficiency 05/31/2014   pt not aware of this diagnosis   Anemia, iron deficiency    Anxiety    Bilateral lower extremity edema    a. uses torsemide   Cervical dysplasia    abnormal paps   Colon cancer screening 06/14/2014   Coronary artery disease    Cough 08/08/2012   Degenerative disk disease 11/19/2011   Depression    Diabetes mellitus type II    Diverticulosis    DJD (degenerative joint disease)    Drug rash 05/22/2011   Dry eyes    Dysrhythmia    metoprolol.   Edema    Elevated liver enzymes 03/21/2012   Encounter for routine gynecological examination 06/14/2014   ESOPHAGITIS 03/28/2007   Qualifier: Hospitalized for  By: Marcelino Scot CMA, [place]     Fibromyalgia    GASTRITIS 03/28/2007   Qualifier: History of  By: Marcelino Scot CMA, [place] Bilberry     GERD (gastroesophageal reflux disease)    Hemorrhoids    external   HLD (hyperlipidemia)    HNP (herniated nucleus pulposus) 11/1997   T6,7,8 with DJD   HTN (hypertension)    Hyperglycemia 05/13/2008   Qualifier: Diagnosis of  By: Glori Bickers MD, [place]ell [place]    Hypothyroidism    Interstitial lung disease ([place])    pt not aware of this  Left ovarian cyst    x 3, rupture   Osteoarthritis    hands   Osteopenia    mild-11/01; improved 12/05   Other screening mammogram 08/18/2011   Palpitations    PERSONAL HISTORY ALLERGY UNSPEC MEDICINAL AGENT 03/18/2010   Qualifier: Diagnosis of  By: Glori Bickers MD, Carmell [place]    PONV (postoperative nausea and vomiting)    Raynaud's disease    Recurrent HSV (herpes simplex virus)    lesions in nose or mouth with frequent ulcers. d/t meds and dry mouth   Rheumatoid arthritis ([place])    Rhinitis 03/21/2012   Routine general medical examination at a health care facility 03/28/2011    Family History  Problem Relation Age of Onset   Lung cancer Mother        + smoker   Coronary artery disease Mother         relatively young   Emphysema Father        + smoker   Lymphoma Sister    Heart disease Brother    Lymphoma Brother    Parkinson's disease Brother    Anemia Brother        aplastic    Diabetes Brother    Iron deficiency Daughter    Leukemia Son    Breast cancer Neg Hx    Past Surgical History:  Procedure Laterality Date   ABDOMINAL EXPLORATION SURGERY     ACDF  1991   BIOPSY OF SKIN SUBCUTANEOUS TISSUE AND/OR MUCOUS MEMBRANE Left 04/08/2021   Procedure: BIOPSY OF SKIN SUBCUTANEOUS TISSUE AND/OR MUCOUS MEMBRANE ( REMOVAL SKIN NODULE);  Surgeon: Katha Cabal, MD;  Location: [facility] ORS;  Service: Vascular;  Laterality: Left;   COLONOSCOPY  08/2000   Diverticulosis; hemorrhoids   HAND SURGERY Left    left thumb. PIN REMOVED   LASIK     bilateral   Social History   Social History Narrative   Married1 Investment banker, corporate to dean at [employer] regularly exerciseDaily caffeine use: 2/day.   Lives with husband. Feels safe in her home.   Immunization History  Administered Date(s) Administered   Fluad Quad(high Dose 65+) 08/07/2020   Influenza Split 06/19/2011   Influenza Whole 08/01/2009   Influenza, High Dose Seasonal PF 08/14/2018, 07/03/2019   Influenza,inj,Quad PF,6+ Mos 06/14/2014, 08/25/2015, 07/26/2016, 08/19/2017   Moderna Sars-Covid-2 Vaccination 09/17/2020   PFIZER(Purple Top)SARS-COV-2 Vaccination 11/27/2019, 12/18/2019   Pneumococcal Conjugate-13 08/25/2015   Pneumococcal Polysaccharide-23 08/25/2016   Td 11/25/2003   Tdap 06/14/2014   Zoster, Live 09/16/2014     Objective: Vital Signs: BP (!) 153/86 (BP Location: Left Arm, Patient Position: Sitting, Cuff Size: Normal)   Pulse 97   Resp 17   Ht 5' 1.5" (1.562 m)   Wt 188 lb (85.3 kg)   BMI 34.95 kg/m    Physical Exam Vitals and nursing note reviewed.  Constitutional:      Appearance: She is well-developed.  HENT:     Head: Normocephalic and atraumatic.  Eyes:     Conjunctiva/sclera: Conjunctivae normal.   Cardiovascular:     Rate and Rhythm: Normal rate and regular rhythm.     Heart sounds: Normal heart sounds.  Pulmonary:     Effort: Pulmonary effort is normal.     Breath sounds: Normal breath sounds.  Abdominal:     General: Bowel sounds are normal.     Palpations: Abdomen is soft.  Musculoskeletal:     Cervical back: Normal range of motion.  Lymphadenopathy:     Cervical: No  cervical adenopathy.  Skin:    General: Skin is warm and dry.     Capillary Refill: Capillary refill takes less than 2 seconds.  Neurological:     Mental Status: She is alert and oriented to person, place, and time.  Psychiatric:        Behavior: Behavior normal.      Musculoskeletal Exam: She had a little lateral rotation of the cervical spine.  Thoracic kyphosis was noted.  Shoulder joints, elbow joints, wrist joints with good range of motion.  She has synovitis in several of her MCPs and PIPs as described below.  She had limited range of motion of the hip joints without discomfort.  Knee joints with good range of motion.  She had overcrowding of the toes with no synovitis.  CDAI Exam: CDAI Score: 11  Patient Global: 5 mm; Provider Global: 5 mm Swollen: 5 ; Tender: 5  Joint Exam 03/29/2022      Right  Left  MCP 1  Swollen Tender     MCP 2     Swollen Tender  PIP 2  Swollen Tender     PIP 4  Swollen Tender     PIP 5  Swollen Tender        Investigation: No additional findings.  Imaging: No results found.  Recent Labs: Lab Results  Component Value Date   WBC 7.0 03/04/2022   HGB 14.5 03/04/2022   PLT 289 03/04/2022   NA 140 03/04/2022   K 4.7 03/04/2022   CL 98 03/04/2022   CO2 25 03/04/2022   GLUCOSE 135 (H) 03/04/2022   BUN 19 03/04/2022   CREATININE 0.95 03/04/2022   BILITOT 0.2 03/04/2022   ALKPHOS 67 03/04/2022   AST 25 03/04/2022   ALT 23 03/04/2022   PROT 7.6 03/04/2022   ALBUMIN 4.6 03/04/2022   CALCIUM 9.5 03/04/2022   GFRAA 75 03/24/2021   QFTBGOLDPLUS NEGATIVE  02/03/2022    Speciality Comments: [place]- mood disorder  Procedures:  No procedures performed Allergies: Ceftin [cefuroxime], Clonidine derivatives, Erythromycin, Keflex [cephalexin], Penicillins, Sulfonamide derivatives, Ciprofloxacin, Fluoxetine hcl, Furosemide, Gabapentin, Paroxetine, Pregabalin, Tetracycline, Venlafaxine, Etodolac, Macrobid [nitrofurantoin], Amitriptyline hcl, Atorvastatin, Benicar [olmesartan medoxomil], Cephalexin, Cetirizine hcl, Diovan [valsartan], and Naproxen sodium   Assessment / Plan:     Visit Diagnoses: Rheumatoid arthritis of multiple sites with negative rheumatoid factor (HCC)-patient is having a flare of rheumatoid arthritis with increased pain and swelling in her joints.  She states the swelling started after starting leflunomide.  She stopped leflunomide because it was causing mood changes.  She states she was crying all the time when she was leflunomide.  Since she stopped leflunomide her mood has been stable.  She has synovitis in multiple joints on examination as described above.  Different treatment options and their side effects were discussed.  Patient has multiple allergies.  She states that Actemra has been working well for her.  I discussed the option of adding leflunomide.  She has sulfa allergy.  I advised her to try half a pill of hydroxychloroquine and if tolerated she can increase the dose to 200 mg p.o. twice daily Monday to Friday.  She will also need baseline eye examination and then annual eye examination to screen for ocular toxicity.  I advised her to increase prednisone to 15 mg p.o. daily for 2 days, 10 mg p.o. daily for 2 days and then resume prednisone 6 mg p.o. daily.  She will monitor blood glucose level closely while she is on prednisone.  A handout on hydroxychloroquine was given and side effects were discussed.  Informed consent was obtained.  Her dose will be hydroxychloroquine 200 mg p.o. twice daily Monday to Friday only.  Patient was  counseled on the purpose, proper use, and adverse effects of hydroxychloroquine including nausea/diarrhea, skin rash, headaches, and sun sensitivity.  Advised patient to wear sunscreen once starting hydroxychloroquine to reduce risk of rash associated with sun sensitivity.  Discussed importance of annual eye exams while on hydroxychloroquine to monitor to ocular toxicity and discussed importance of frequent laboratory monitoring.  Provided patient with eye exam form for baseline ophthalmologic exam.  Reviewed risk for QTC prolongation when used in combination with other QTc prolonging agents (including but not limited to antiarrhythmics, macrolide antibiotics, flouroquinolones, tricyclic antidepressants, citalopram, specific antipsychotics, ondansetron, migraine triptans, and methadone). Provided patient with educational materials on hydroxychloroquine and answered all questions.  Patient consented to hydroxychloroquine. Will upload consent in the media tab.    High risk medication use -  Actemra 162 mg subcutaneous injections every 14 days.  Labs obtained on Mar 04, 2022, CBC with differential and CMP with GFR were normal.  TB gold was negative on February 03, 2022.  She was advised to hold Actemra if she develops an infection and resume after the infection resolves.  ILD (interstitial lung disease) ([place]) - HR chest CT ordered on 01/16/2021: Mildy progressive compared to [address].  Followed by Dr. RChase Caller  She was evaluated by Dr. RChase Calleron June 06/2022.  I reviewed his note.  He recommended repeat DLCO in 6 months.  Her ILD has been stable over the years.  Raynaud's disease without gangrene-she continues to have Raynaud's phenomenon.  Keeping the core temperature warm was discussed.  Chronic left shoulder pain-she had good range of motion of both shoulders today.  Primary osteoarthritis of both knees-she denied discomfort in her knee joints.  DDD (degenerative disc disease), cervical-she has limited  range of motion of the cervical spine.  History of fusion of cervical spine (C5-6 ACDF)  DDD (degenerative disc disease), thoracic-she has thoracic kyphosis.  DDD (degenerative disc disease), lumbar-she continues to have chronic pain.  Fibromyalgia syndrome-she has generalized pain and hyperalgesia with positive tender points.  She is going to pain management.  Chronic pain syndrome-she is going to pain management.  Long term current use of opiate analgesic - She follows up with pain management closely.   Other medical problems are listed as follows:  Type 2 diabetes mellitus without complication, without long-term current use of insulin (HCC)  Hx of arteriovenous malformation (AVM) - Status post removal of AV malformation left leg.  Followed by vascular surgery-Dr. SDelana Meyer   Mitral valve disorder  Primary hypertension  Venous ulcer of left leg (HCC) - Ulceration has healed.  Hyperlipidemia associated with type 2 diabetes mellitus ([place]  History of gastroesophageal reflux (GERD)  History of IBS  Chronic renal impairment, stage 3a ([place]  History of hypothyroidism  Depression with anxiety  Orders: No orders of the defined types were placed in this encounter.  Meds ordered this encounter  Medications   hydroxychloroquine (PLAQUENIL) 200 MG tablet    Sig: Take 1 tablet 200 mg BID Monday-Friday    Dispense:  40 tablet    Refill:  2     Follow-Up Instructions: Return in about 2 months (around 05/29/2022) for Rheumatoid arthritis.   SBo Merino MD  Note - This record has been created using DEditor, commissioning  Chart creation errors have been sought, but may  not always  have been located. Such creation errors do not reflect on  the standard of medical care.

## 2022-03-17 ENCOUNTER — Encounter: Payer: Self-pay | Admitting: Rheumatology

## 2022-03-17 ENCOUNTER — Ambulatory Visit: Payer: Medicare Other | Admitting: Physician Assistant

## 2022-03-17 DIAGNOSIS — M0609 Rheumatoid arthritis without rheumatoid factor, multiple sites: Secondary | ICD-10-CM

## 2022-03-17 DIAGNOSIS — J849 Interstitial pulmonary disease, unspecified: Secondary | ICD-10-CM

## 2022-03-17 DIAGNOSIS — Z79899 Other long term (current) drug therapy: Secondary | ICD-10-CM

## 2022-03-17 MED ORDER — ACTEMRA ACTPEN 162 MG/0.9ML ~~LOC~~ SOAJ: 162.0000 mg | SUBCUTANEOUS | 0 refills | Status: DC

## 2022-03-17 NOTE — Telephone Encounter (Signed)
Next Visit: 03/29/2022  Last Visit: 02/03/2022  Last Fill: 12/30/2021  GE:[id number] arthritis of multiple sites with negative rheumatoid factor   Current Dose per office note 02/03/2022: actemra 162 mg sq injections every 14 days   Labs: 03/04/2022 CBC and CMP are normal.  Glucose is elevated  TB Gold: 02/03/2022 Neg    Okay to refill Actemra?

## 2022-03-23 ENCOUNTER — Encounter: Payer: Self-pay | Admitting: Family Medicine

## 2022-03-23 ENCOUNTER — Ambulatory Visit (INDEPENDENT_AMBULATORY_CARE_PROVIDER_SITE_OTHER): Payer: Medicare Other | Admitting: Family Medicine

## 2022-03-23 VITALS — BP 128/82 | HR 91 | Ht 61.0 in | Wt 187.2 lb

## 2022-03-23 DIAGNOSIS — I10 Essential (primary) hypertension: Secondary | ICD-10-CM | POA: Diagnosis not present

## 2022-03-23 DIAGNOSIS — E039 Hypothyroidism, unspecified: Secondary | ICD-10-CM | POA: Diagnosis not present

## 2022-03-23 DIAGNOSIS — R350 Frequency of micturition: Secondary | ICD-10-CM

## 2022-03-23 DIAGNOSIS — N3000 Acute cystitis without hematuria: Secondary | ICD-10-CM | POA: Diagnosis not present

## 2022-03-23 DIAGNOSIS — E1169 Type 2 diabetes mellitus with other specified complication: Secondary | ICD-10-CM

## 2022-03-23 DIAGNOSIS — E119 Type 2 diabetes mellitus without complications: Secondary | ICD-10-CM

## 2022-03-23 DIAGNOSIS — M0609 Rheumatoid arthritis without rheumatoid factor, multiple sites: Secondary | ICD-10-CM | POA: Diagnosis not present

## 2022-03-23 DIAGNOSIS — E785 Hyperlipidemia, unspecified: Secondary | ICD-10-CM | POA: Diagnosis not present

## 2022-03-23 LAB — TSH: TSH: 3.46 u[IU]/mL (ref 0.35–5.50)

## 2022-03-23 LAB — POCT URINALYSIS DIP (CLINITEK)
Bilirubin, UA: NEGATIVE
Blood, UA: NEGATIVE
Glucose, UA: NEGATIVE mg/dL
Ketones, POC UA: NEGATIVE mg/dL
Nitrite, UA: NEGATIVE
POC PROTEIN,UA: NEGATIVE
Spec Grav, UA: 1.01 (ref 1.010–1.025)
Urobilinogen, UA: 0.2 E.U./dL
pH, UA: 6 (ref 5.0–8.0)

## 2022-03-23 LAB — LIPID PANEL
Cholesterol: 192 mg/dL (ref 0–200)
HDL: 64.6 mg/dL (ref >39.00)
LDL Cholesterol: 88 mg/dL (ref 0–99)
NonHDL: 127.1
Total CHOL/HDL Ratio: 3
Triglycerides: 197 mg/dL — ABNORMAL HIGH (ref 0.0–149.0)
VLDL: 39.4 mg/dL (ref 0.0–40.0)

## 2022-03-23 LAB — HEMOGLOBIN A1C: Hgb A1c MFr Bld: 6.6 % — ABNORMAL HIGH (ref 4.6–6.5)

## 2022-03-23 MED ORDER — FOSFOMYCIN TROMETHAMINE 3 G PO PACK: 3.0000 g | PACK | Freq: Once | ORAL | 0 refills | Status: AC

## 2022-03-23 MED ORDER — GLUCOSE BLOOD VI STRP: ORAL_STRIP | 3 refills | Status: AC

## 2022-03-23 NOTE — Patient Instructions (Signed)
Take care of yourself   Labs today  Take the fosfomycin as directed for uti  We will notify you when the culture result returns   Drink lots of water

## 2022-03-23 NOTE — Assessment & Plan Note (Signed)
Lipids today  Disc goals for lipids and reasons to control them Rev last labs with pt Rev low sat fat diet in detail Takes crestor 5 mg twice weekly/able to tolerate

## 2022-03-23 NOTE — Assessment & Plan Note (Signed)
Pos ua  Voiding symptoms  Intol of most abx Fosfomycin sent to pharmacy  cx pending ER precautions reviewed  Enc water intake

## 2022-03-23 NOTE — Assessment & Plan Note (Addendum)
Overdue for lab  At home, decent glucose control despite prednisone for RA a1c today  Lipid today  Rev chem lab from rheum Metformin 500 mg bid  Will need microalb next time (has uti today) On ace and statin  Form filled out for dm shoes/sees podiatry

## 2022-03-23 NOTE — Assessment & Plan Note (Signed)
bp in fair control at this time  BP Readings from Last 1 Encounters:  03/23/22 128/82   No changes needed Most recent labs reviewed  Disc lifstyle change with low sodium diet and exercise  Metoprolol xl 50 mg bid ramipril 5 mg daily

## 2022-03-23 NOTE — Assessment & Plan Note (Signed)
Under care of rheumatology  Prednisone 6 mg daily  avara -some side eff actemra   On meds from pain clinic as well

## 2022-03-23 NOTE — Progress Notes (Signed)
Subjective:    Patient ID: Tina Berger, female    DOB: 29-Aug-1951, 71 y.o.   MRN: [id number]  HPI Pt presents for urinary symptoms and f/u of chronic medical problems  Also form for dm shoes   Wt Readings from Last 3 Encounters:  03/23/22 187 lb 3.2 oz (84.9 kg)  02/03/22 188 lb 6.4 oz (85.5 kg)  11/19/21 191 lb (86.6 kg)   35.37 kg/m   ? Poss uti  1 week of frequency/urgency and dysuria Urinary pressure and low back pain  No chills or fever or nausea  Cranberry juice and azo   Last one treated with fosfomycin  Many med intolerances   Has interstitial lung disease -sees Dr Anette Riedel No big changes in the past 13 years    HTN bp is stable today  No cp or palpitations or headaches or edema  No side effects to medicines  BP Readings from Last 3 Encounters:  03/23/22 128/82  02/03/22 136/87  11/19/21 (!) 160/93    Metoprolol xl 50 mg bid  Ramipril 5 mg daily  Toresemide 20 mg daily   Form for DM shoes today Sees podiatry Has foot deformity   Overdue for DM care  Lab Results  Component Value Date   HGBA1C 6.5 08/20/2020   Seeing rheumatology  Has been on prednisone  Per pt glucose has been pretty good - generally 120s    Metformin 500 mg bid   Results for orders placed or performed in visit on 03/23/22  POCT URINALYSIS DIP (CLINITEK)  Result Value Ref Range   Color, UA yellow yellow   Clarity, UA cloudy (A) clear   Glucose, UA negative negative mg/dL   Bilirubin, UA negative negative   Ketones, POC UA negative negative mg/dL   Spec Grav, UA 1.010 1.010 - 1.025   Blood, UA negative negative   pH, UA 6.0 5.0 - 8.0   POC PROTEIN,UA negative negative, trace   Urobilinogen, UA 0.2 0.2 or 1.0 E.U./dL   Nitrite, UA Negative Negative   Leukocytes, UA Large (3+) (A) Negative      Hyperlipidemia Lab Results  Component Value Date   CHOL 168 08/20/2020   HDL 42.30 08/20/2020   LDLCALC 99 08/20/2020   LDLDIRECT 149.0 07/26/2016   TRIG 133.0  08/20/2020   CHOLHDL 4 08/20/2020   Crestor 5 mg twice weekly   Hypothyroid Lab Results  Component Value Date   TSH 0.36 08/20/2020  Levothyroxine 150 mcg daily   Under care of rheumatology for RA Thought she was doing better  Sees Dr Faylene Million - put her on leflunomide (bad side eff) - has to try for 4 weeks Now hands and feet are worse  Sees her soon  Is on prednisone- down to 6 mg daily (will be at that dose for a while)    From Dr Garen Grams Lab Results  Component Value Date   CREATININE 0.95 03/04/2022   BUN 19 03/04/2022   NA 140 03/04/2022   K 4.7 03/04/2022   CL 98 03/04/2022   CO2 25 03/04/2022   Lab Results  Component Value Date   ALT 23 03/04/2022   AST 25 03/04/2022   ALKPHOS 67 03/04/2022   BILITOT 0.2 03/04/2022   Lab Results  Component Value Date   WBC 7.0 03/04/2022   HGB 14.5 03/04/2022   HCT 43.6 03/04/2022   MCV 90 03/04/2022   PLT 289 03/04/2022   Moods are up and down  She stopped her cymbalta  Patient Active Problem List   Diagnosis Date Noted   Dysuria 09/07/2021   Rheumatoid arthritis ([place]) 05/27/2021   Chronic venous insufficiency 05/23/2021   AVM (arteriovenous malformation) 04/04/2021   CAD (coronary artery disease) 01/22/2021   Primary osteoarthritis of both knees 01/12/2021   Positive ANA (antinuclear antibody) 11/17/2020   Joint pain 11/13/2020   Elevated serum creatinine 08/26/2020   Current use of proton pump inhibitor 08/26/2020   Estrogen deficiency 08/21/2018   TMJ (dislocation of temporomandibular joint), initial encounter 05/05/2018   Anterior neck pain 05/05/2018   Chronic pain syndrome 11/09/2017   Chronic upper extremity pain [facility] Area of Pain) (Bilateral) (L>R) 11/09/2017   Fibromyalgia syndrome 11/09/2017   Osteoarthritis 11/09/2017   Osteoarthritis of lumbar facet joint (Bilateral) 11/09/2017   Lumbar facet arthropathy (Bilateral) 11/09/2017   Lumbar facet syndrome (Bilateral) (L>R) 11/09/2017    Lumbar foraminal stenosis (multilevel) (Bilateral) 11/09/2017   DDD (degenerative disc disease), lumbar 11/09/2017   Thoracic levoscoliosis 11/09/2017   Thoracic facet syndrome (Bilateral) (L>R) 11/09/2017   Thoracic facet arthropathy (Bilateral) (R>L) 11/09/2017   DDD (degenerative disc disease), thoracic 11/09/2017   Thoracolumbar IVDD 11/09/2017   DDD (degenerative disc disease), cervical 11/09/2017   Osteoarthritis of cervical facet (Bilateral) 11/09/2017   Grade 1 Anterolisthesis of C7 over T1 11/09/2017   Cervical foraminal stenosis (Bilateral) 11/09/2017   History of fusion of cervical spine (C5-6 ACDF) 11/09/2017   Cervical facet syndrome (Bilateral) 11/09/2017   Disorder of skeletal system 11/09/2017   Cervical radiculitis (Bilateral) 11/09/2017   Lumbar Epidural lipomatosis 11/09/2017   Chronic sacroiliac joint pain (Bilateral) (L>R) 11/09/2017   Chronic hip pain (Bilateral) (L>R) 11/09/2017   Chronic upper back pain (Primary Area of Pain) (midline) 10/24/2017   Chronic neck pain (Secondary Area of Pain) (Bilateral)  (L>R) 10/24/2017   Chronic low back pain (Fourth Area of Pain) (Bilateral) (L>R) 10/24/2017   Chronic lower extremity pain (Fifth Area of Pain) (Bilateral) (L>R) 10/24/2017   Lumbar Grade 1 Retrolisthesis of L1-2 and L2-3 10/24/2017   Other long term (current) drug therapy 10/24/2017   Other specified health status 10/24/2017   Long term current use of opiate analgesic 10/24/2017   Long term prescription opiate use 10/24/2017   Opiate use 10/24/2017   DM type 2 (diabetes mellitus, type 2) ([place]) 06/14/2014   Pharmacologic therapy 06/14/2014   Pedal edema    Post-menopausal 06/23/2012   Lumbar disc disease with radiculopathy 11/18/2011   HTN (hypertension) 07/30/2011   Raynaud disease 07/30/2011   Obesity 07/30/2011   Routine general medical examination at a health care facility 03/28/2011   Palpitations 04/27/2010   UTI (urinary tract infection) 03/18/2010    Depression with anxiety 06/14/2008   Vitamin D deficiency 05/13/2008   ANXIETY 03/25/2008   Osteopenia 03/25/2008   Hypothyroidism 03/28/2007   Hyperlipidemia associated with type 2 diabetes mellitus ([place]) 03/28/2007   Other iron deficiency anemias 03/28/2007   PANIC ATTACK 03/28/2007   KERATOCONJUNCTIVITIS SICCA 03/28/2007   Mitral valve disorder 03/28/2007   ABNORMAL HEART RHYTHMS 03/28/2007   Raynaud's syndrome 03/28/2007   GERD 03/28/2007   IBS 03/28/2007   Rosacea 03/28/2007   PLANTAR FASCIITIS 03/28/2007   MIGRAINES, HX OF 03/28/2007   Past Medical History:  Diagnosis Date   Acute renal insufficiency 05/31/2014   pt not aware of this diagnosis   Anemia, iron deficiency    Anxiety    Bilateral lower extremity edema    a. uses torsemide   Cervical dysplasia    abnormal  paps   Colon cancer screening 06/14/2014   Coronary artery disease    Cough 08/08/2012   Degenerative disk disease 11/19/2011   Depression    Diabetes mellitus type II    Diverticulosis    DJD (degenerative joint disease)    Drug rash 05/22/2011   Dry eyes    Dysrhythmia    metoprolol.   Edema    Elevated liver enzymes 03/21/2012   Encounter for routine gynecological examination 06/14/2014   ESOPHAGITIS 03/28/2007   Qualifier: Hospitalized for  By: Marcelino Scot CMA, [place]     Fibromyalgia    GASTRITIS 03/28/2007   Qualifier: History of  By: Marcelino Scot CMA, [place] Bilberry     GERD (gastroesophageal reflux disease)    Hemorrhoids    external   HLD (hyperlipidemia)    HNP (herniated nucleus pulposus) 11/1997   T6,7,8 with DJD   HTN (hypertension)    Hyperglycemia 05/13/2008   Qualifier: Diagnosis of  By: Glori Bickers MD, Carmell [place]    Hypothyroidism    Interstitial lung disease ([place])    pt not aware of this   Left ovarian cyst    x 3, rupture   Osteoarthritis    hands   Osteopenia    mild-11/01; improved 12/05   Other screening mammogram 08/18/2011   Palpitations    PERSONAL HISTORY ALLERGY  UNSPEC MEDICINAL AGENT 03/18/2010   Qualifier: Diagnosis of  By: Glori Bickers MD, Carmell [place]    PONV (postoperative nausea and vomiting)    Raynaud's disease    Recurrent HSV (herpes simplex virus)    lesions in nose or mouth with frequent ulcers. d/t meds and dry mouth   Rheumatoid arthritis ([place])    Rhinitis 03/21/2012   Routine general medical examination at a health care facility 03/28/2011   Past Surgical History:  Procedure Laterality Date   ABDOMINAL EXPLORATION SURGERY     ACDF  1991   BIOPSY OF SKIN SUBCUTANEOUS TISSUE AND/OR MUCOUS MEMBRANE Left 04/08/2021   Procedure: BIOPSY OF SKIN SUBCUTANEOUS TISSUE AND/OR MUCOUS MEMBRANE ( REMOVAL SKIN NODULE);  Surgeon: Katha Cabal, MD;  Location: [facility] ORS;  Service: Vascular;  Laterality: Left;   COLONOSCOPY  08/2000   Diverticulosis; hemorrhoids   HAND SURGERY Left    left thumb. PIN REMOVED   LASIK     bilateral   Social History   Tobacco Use   Smoking status: Former    Packs/day: 1.00    Years: 5.00    Pack years: 5.00    Types: Cigarettes    Quit date: 10/18/1968    Years since quitting: 53.4    Passive exposure: Never   Smokeless tobacco: Never   Tobacco comments:    quit over 40 years  Vaping Use   Vaping Use: Never used  Substance Use Topics   Alcohol use: No    Alcohol/week: 0.0 standard drinks   Drug use: No   Family History  Problem Relation Age of Onset   Emphysema Father        + smoker   Lung cancer Mother        + smoker   Coronary artery disease Mother        relatively young   Lymphoma Sister    Heart disease Brother    Lymphoma Brother    Parkinson's disease Brother    Anemia Brother        aplastic    Diabetes Brother    Leukemia Son    Iron deficiency Daughter  Breast cancer Neg Hx    Allergies  Allergen Reactions   Ceftin [Cefuroxime] Swelling    Swelling, "legs turn blue".  Legs swelling, then blue, then a rash   Clonidine Derivatives     Swelling of lips   Erythromycin Swelling     Rash, swollen gums    Keflex [Cephalexin] Anaphylaxis    Chest was broken out in a rash. Lips were swelling. Took a few days to occur, but it kept getting worse.                                                                                                                                                                                                   Penicillins Anaphylaxis    Pt had a daughter with an allergy to this at a young age. Patient developed blisters anywhere her child touched her. Also, if she used the bathroom after daughter did while on keflex, it would cause a reaction for her. Lips and roof of mouth swelled up also.   Sulfonamide Derivatives Swelling    Rash, swollen gums, lips   Ciprofloxacin     REACTION: ? rash vs sun rxn. Rash    Fluoxetine Hcl     REACTION: stomach problems. Severe pain in abdomen. Could not function d/t pain   Furosemide Swelling    REACTION: swelling worsened in legs once taking   Gabapentin Other (See Comments)    REACTION: edema of feet. Unable to get shoes on   Paroxetine     REACTION: weight gain (30 pound weight gain   Pregabalin     REACTION: swelling of feet and legs. Unable to get shoes on.   Tetracycline     REACTION:inflammed genitals.    Venlafaxine     REACTION: sweating profusely   Etodolac     REACTION: reaction not known   Macrobid [Nitrofurantoin]     REACTION: ? Lung problem    Amitriptyline Hcl     REACTION: sedating   Atorvastatin     REACTION: muscle twitch and pain   Benicar [Olmesartan Medoxomil]     Muscle pain    Cephalexin Hives and Rash   Cetirizine Hcl     REACTION: headache   Diovan [Valsartan]     Thought it made her feel confused   Naproxen Sodium Other (See Comments)    REACTION: edema of feet and legs.  Not good for ckd   Current Outpatient Medications on File Prior to Visit  Medication Sig Dispense Refill   Acetaminophen (TYLENOL ARTHRITIS PAIN PO) Take 650 mg by mouth daily.  buPROPion  (WELLBUTRIN XL) 150 MG 24 hr tablet Take 1 tablet (150 mg total) by mouth daily. 90 tablet 0   Chlorphen-Phenyleph-ASA (ALKA-SELTZER PLUS COLD PO) Take by mouth as needed. Just for colds     cholecalciferol (VITAMIN D3) 25 MCG (1000 UNIT) tablet Take 1,000 Units by mouth daily.     cyclobenzaprine (FLEXERIL) 10 MG tablet TAKE 1/2 TO 1 TABLET BY  MOUTH ONCE DAILY AND 1  TABLET AT BEDTIME 180 tablet 1   Diclofenac Sodium 1.5 % SOLN Place 2 mLs onto the skin 4 (four) times daily. 3 Bottle 3   famotidine (PEPCID) 40 MG tablet Take 1 tablet (40 mg total) by mouth daily. 90 tablet 0   ferrous sulfate 325 (65 FE) MG EC tablet Take 325 mg by mouth daily with breakfast.     FLUAD QUADRIVALENT 0.5 ML injection      GUAIFENESIN CR PO Take by mouth as needed.     HYDROmorphone HCl (EXALGO) 12 MG T24A SR tablet Take 12 mg by mouth 2 (two) times daily.     hyoscyamine (LEVSIN SL) 0.125 MG SL tablet Take 1 tablet (0.125 mg total) by mouth every 4 (four) hours as needed for cramping. 270 tablet 0   ketoconazole (NIZORAL) 2 % shampoo SHAMPOO WITH A SMALL AMOUNT AS DIRECTED THREE TIMES A WEEK SHAMPOO SCALP 3 DAYS A WEEK, LET SHAMPOO SIT FOR 10 MINUTES AND RINSE OFF 120 mL 2   levothyroxine (SYNTHROID) 150 MCG tablet Take 1 tablet (150 mcg total) by mouth daily. 90 tablet 0   LYSINE PO Take by mouth as needed. OTC for ulcer prophylaxis     Magnesium 400 MG CAPS Take by mouth daily.     metFORMIN (GLUCOPHAGE) 500 MG tablet TAKE 1 TABLET BY MOUTH TWICE  DAILY WITH MEALS 180 tablet 2   metoprolol succinate (TOPROL-XL) 50 MG 24 hr tablet Take 1 tablet (50 mg total) by mouth 2 (two) times daily. Take with or immediately following a meal. 180 tablet 0   ondansetron (ZOFRAN) 4 MG tablet Take 1 tablet (4 mg total) by mouth every 8 (eight) hours as needed for nausea or vomiting. 20 tablet 0   oxyCODONE (OXYCONTIN) 10 MG 12 hr tablet Take 10 mg by mouth 3 (three) times daily as needed. Beakthrough pain     predniSONE  (DELTASONE) 1 MG tablet Take 2 tabs po qd x 2 weeks (along with '5mg'$  tab), then take 1 tab po qd x 2 weeks (along with '5mg'$  tab). Taper by '1mg'$  every 2 weeks. 42 tablet 0   predniSONE (DELTASONE) 1 MG tablet Take 1 tablet (1 mg total) by mouth daily with breakfast. Along with 5 mg tablet to equal 6 mg daily. 30 tablet 0   predniSONE (DELTASONE) 1 MG tablet TAKE 1 TABLET BY MOUTH ONCE WITH BREAKFAST , ALONG WITH '5MG'$  TO EQUAL '6MG'$  DAILY 30 tablet 0   predniSONE (DELTASONE) 5 MG tablet TAKE 1 TABLET BY MOUTH EVERY DAY 30 tablet 0   ramipril (ALTACE) 5 MG capsule Take 1 capsule (5 mg total) by mouth daily. 90 capsule 0   rosuvastatin (CRESTOR) 5 MG tablet TAKE 1 TABLET BY MOUTH TWICE WEEKLY 26 tablet 0   Tocilizumab (ACTEMRA ACTPEN) 162 MG/0.9ML SOAJ Inject 162 mg into the skin every 14 (fourteen) days. 5.4 mL 0   leflunomide (ARAVA) 10 MG tablet Take one tablet po daily x 2 weeks. If labs are stable increase to 20 mg po daily. (Patient not taking: Reported  on 03/23/2022) 42 tablet 0   No current facility-administered medications on file prior to visit.     Review of Systems  Constitutional:  Positive for fatigue. Negative for activity change, appetite change, fever and unexpected weight change.  HENT:  Negative for congestion, ear pain, rhinorrhea, sinus pressure and sore throat.   Eyes:  Negative for pain, redness and visual disturbance.  Respiratory:  Negative for cough, shortness of breath and wheezing.   Cardiovascular:  Negative for chest pain and palpitations.  Gastrointestinal:  Negative for abdominal pain, blood in stool, constipation and diarrhea.  Endocrine: Negative for polydipsia and polyuria.  Genitourinary:  Negative for dysuria, frequency and urgency.  Musculoskeletal:  Positive for arthralgias, back pain and myalgias.  Skin:  Negative for pallor and rash.  Allergic/Immunologic: Negative for environmental allergies.  Neurological:  Positive for numbness. Negative for dizziness, syncope  and headaches.  Hematological:  Negative for adenopathy. Does not bruise/bleed easily.  Psychiatric/Behavioral:  Negative for decreased concentration and dysphoric mood. The patient is not nervous/anxious.        Some mood ups and downs Does not want to take cymbalta       Objective:   Physical Exam Constitutional:      General: She is not in acute distress.    Appearance: Normal appearance. She is well-developed. She is obese. She is not ill-appearing or diaphoretic.  HENT:     Head: Normocephalic and atraumatic.     Mouth/Throat:     Mouth: Mucous membranes are moist.  Eyes:     General: No scleral icterus.    Conjunctiva/sclera: Conjunctivae normal.     Pupils: Pupils are equal, round, and reactive to light.  Neck:     Thyroid: No thyromegaly.     Vascular: No carotid bruit or JVD.  Cardiovascular:     Rate and Rhythm: Normal rate and regular rhythm.     Pulses: Normal pulses.     Heart sounds: Normal heart sounds.    No gallop.  Pulmonary:     Effort: Pulmonary effort is normal. No respiratory distress.     Breath sounds: Normal breath sounds. No stridor. No wheezing, rhonchi or rales.  Abdominal:     General: There is no distension or abdominal bruit.     Palpations: Abdomen is soft.     Tenderness: There is no abdominal tenderness.  Musculoskeletal:     Cervical back: Normal range of motion and neck supple. No tenderness.     Right lower leg: No edema.     Left lower leg: No edema.  Lymphadenopathy:     Cervical: No cervical adenopathy.  Skin:    General: Skin is warm and dry.     Coloration: Skin is not pale.     Findings: No rash.     Comments: Changes of raynaud's in toes/ red/purple color  Cool to touch   2nd toes overlap first Hallux valgus bilat-feet   Neurological:     Mental Status: She is alert.     Coordination: Coordination normal.     Deep Tendon Reflexes: Reflexes are normal and symmetric. Reflexes normal.  Psychiatric:        Mood and Affect:  Mood normal. Affect is tearful.        Cognition and Memory: Cognition and memory normal.     Comments: Occ tearful Candidly discusses symptoms and stressors            Assessment & Plan:   Problem List Items Addressed  This Visit       Cardiovascular and Mediastinum   HTN (hypertension)    bp in fair control at this time  BP Readings from Last 1 Encounters:  03/23/22 128/82  No changes needed Most recent labs reviewed  Disc lifstyle change with low sodium diet and exercise  Metoprolol xl 50 mg bid ramipril 5 mg daily        Relevant Orders   Lipid panel     Endocrine   DM type 2 (diabetes mellitus, type 2) ([place])    Overdue for lab  At home, decent glucose control despite prednisone for RA a1c today  Lipid today  Rev chem lab from rheum Metformin 500 mg bid  Will need microalb next time (has uti today) On ace and statin  Form filled out for dm shoes/sees podiatry       Relevant Orders   Hemoglobin A1c   Hyperlipidemia associated with type 2 diabetes mellitus ([place])    Lipids today  Disc goals for lipids and reasons to control them Rev last labs with pt Rev low sat fat diet in detail Takes crestor 5 mg twice weekly/able to tolerate       Relevant Orders   Lipid panel   Hypothyroidism    TSH today  No clinical changes  On 150 mcg levothyroxine daily        Relevant Orders   TSH     Musculoskeletal and Integument   Rheumatoid arthritis ([place])    Under care of rheumatology  Prednisone 6 mg daily  avara -some side eff actemra   On meds from pain clinic as well           Genitourinary   UTI (urinary tract infection) - Primary    Pos ua  Voiding symptoms  Intol of most abx Fosfomycin sent to pharmacy  cx pending ER precautions reviewed  Enc water intake       Relevant Medications   fosfomycin (MONUROL) 3 g PACK   Other Relevant Orders   Urine Culture   Other Visit Diagnoses     Urine frequency       Relevant Orders   POCT  URINALYSIS DIP (CLINITEK) (Completed)

## 2022-03-23 NOTE — Assessment & Plan Note (Signed)
TSH today  No clinical changes  On 150 mcg levothyroxine daily

## 2022-03-24 ENCOUNTER — Other Ambulatory Visit: Payer: Self-pay

## 2022-03-24 MED ORDER — LEVOTHYROXINE SODIUM 150 MCG PO TABS: 150.0000 ug | ORAL_TABLET | Freq: Every day | ORAL | 3 refills | Status: DC

## 2022-03-25 ENCOUNTER — Encounter: Payer: Self-pay | Admitting: Family Medicine

## 2022-03-25 ENCOUNTER — Other Ambulatory Visit: Payer: Self-pay | Admitting: Internal Medicine

## 2022-03-25 ENCOUNTER — Encounter: Payer: Self-pay | Admitting: Rheumatology

## 2022-03-25 DIAGNOSIS — M359 Systemic involvement of connective tissue, unspecified: Secondary | ICD-10-CM

## 2022-03-25 LAB — URINE CULTURE
MICRO NUMBER:: 13489412
SPECIMEN QUALITY:: ADEQUATE

## 2022-03-26 ENCOUNTER — Ambulatory Visit (INDEPENDENT_AMBULATORY_CARE_PROVIDER_SITE_OTHER): Payer: Medicare Other | Admitting: Internal Medicine

## 2022-03-26 ENCOUNTER — Encounter: Payer: Self-pay | Admitting: Internal Medicine

## 2022-03-26 ENCOUNTER — Telehealth: Payer: Self-pay

## 2022-03-26 ENCOUNTER — Ambulatory Visit: Payer: Medicare Other | Admitting: Internal Medicine

## 2022-03-26 VITALS — BP 126/80 | HR 80 | Ht 61.0 in | Wt 188.8 lb

## 2022-03-26 DIAGNOSIS — M0609 Rheumatoid arthritis without rheumatoid factor, multiple sites: Secondary | ICD-10-CM | POA: Diagnosis not present

## 2022-03-26 DIAGNOSIS — J8489 Other specified interstitial pulmonary diseases: Secondary | ICD-10-CM

## 2022-03-26 DIAGNOSIS — Z889 Allergy status to unspecified drugs, medicaments and biological substances status: Secondary | ICD-10-CM

## 2022-03-26 DIAGNOSIS — M359 Systemic involvement of connective tissue, unspecified: Secondary | ICD-10-CM | POA: Diagnosis not present

## 2022-03-26 LAB — PULMONARY FUNCTION TEST
DL/VA % pred: 86 %
DL/VA: 3.66 ml/min/mmHg/L
DLCO cor % pred: 84 %
DLCO cor: 14.7 ml/min/mmHg
DLCO unc % pred: 87 %
DLCO unc: 15.17 ml/min/mmHg
FEF 25-75 Pre: 2.59 L/sec
FEF2575-%Pred-Pre: 158 %
FEV1-%Pred-Pre: 103 %
FEV1-Pre: 2 L
FEV1FVC-%Pred-Pre: 112 %
FEV6-%Pred-Pre: 95 %
FEV6-Pre: 2.33 L
FEV6FVC-%Pred-Pre: 105 %
FVC-%Pred-Pre: 91 %
FVC-Pre: 2.35 L
Pre FEV1/FVC ratio: 85 %
Pre FEV6/FVC Ratio: 100 %

## 2022-03-26 NOTE — Progress Notes (Signed)
Spirometry and DLCO Performed Today.  

## 2022-03-26 NOTE — Patient Instructions (Addendum)
Interstitial lung disease due to connective tissue disease ([place]) Drug allergy, multiple  Rheumatoid arthritis of multiple sites with negative rheumatoid factor (H[facility]) Raynaud's disease without gangrene  -You have very mild interstitial lung disease and this is due to rheumatoid arthritis   - [based on symptoms, walk test, pulmonary function test and CT scan] -Since 2013 there is very mild progression through 2022  - aug 2022 -> June 2023 is stable   Plan - continue supportive care  - continue actemra through Dr D for RA   - can be beneficial to lungs -Do repeat pulmonary function test spirometry and DLCO in 6 months [can be done at [facility] in [place] - FLu and RSV shot (any inactivated but not live vaccine) - avoid respiratory infections - mask up indoors and human clusters   Follow-up - 6 months do spirometry and DLCO on return to see Dr. Chase Berger for follow-up in a 15-minute slot  -Symptom score and simple walking desaturation test at follow-up

## 2022-03-26 NOTE — Telephone Encounter (Signed)
-----   Message from Abner Greenspan, MD sent at 03/25/2022  4:48 PM EDT ----- Your urine culture was positive as expected  Let us know if the antibiotic did not help yet

## 2022-03-26 NOTE — Telephone Encounter (Signed)
I left a message for the patient to return my call.

## 2022-03-26 NOTE — Patient Instructions (Signed)
Spirometry and DLCO Performed Today.  

## 2022-03-26 NOTE — Progress Notes (Signed)
OV 06/02/2021  Subjective:  Patient ID: Tina Berger, female , DOB: 1951-09-14 , age 71 y.o. , MRN: [id number] , ADDRESS: [place] [id number] PCP Tower, [name], MD Patient Care Team: Tower, [name], MD as PCP - General [name], [name], MD as Consulting Physician (Cardiology) [name], [facility] as Pharmacist (Pharmacist)  This Provider for this visit: Treatment Team:  Attending Provider: Brand Males, MD    06/02/2021 -   Chief Complaint  Patient presents with   Consult    Pt is being referred by Dr. Estanislado Pandy for ILD work up. Pt had PFT performed today.  Pt states that she has had problems with SOB for years but it has been just getting worse.     HPI Tina Berger 71 y.o. -referred by Dr. Estanislado Pandy for concern of rheumatoid arthritis ILD.  History is provided by the patient and also review of the records.  She tells me that in 2013 she used to work at [employer] and was exposed to chlorine at work and a few days later there was a birthday party and in the swimming pool there is a lot of chlorine.  And a few days after that started having significant shortness of breath with exertion relieved by rest.  Was hypoxemic was admitted for 5 days.  During this time she was also nitrofurantoin and was informed she had ILD because of chlorine exposure and nitrofurantoin.  After that she recovered and has been stable without any respiratory symptoms.  Then starting October 2021 started noticing increased fatigue and also in decreased mobility and hand and joint stiffness.  She did see Dr. Keturah Barre in March and April 2022.  Was given a diagnosis of rheumatoid arthritis negative rheumatoid factor positive CCP.  Mixed with methotrexate was discussed but later patient declined it.  Then in April 2022 started Imuran.  She tells me she took Imuran for 8 weeks but then had vomiting fatigue fogginess and also scared about getting leukemia because she lost her son from  leukemia.  Therefore she stopped it.  She feels prednisone will help her a lot but she says that she has been advised about the side effects of prednisone.  She has multiple drug allergies.  She says because of her ILD she was advised about 2 options for her rheumatoid arthritis.  I am presuming based on chart review this is methotrexate and Imuran.  She says the sores are too restrictive.  She specifically wants to know how I would treat her ILD.   Integrated Comprehensive ILD Questionnaire  Symptoms:    SYMPTOM SCALE - ILD 06/02/2021   O2 use ra  Shortness of Breath 0 -> 5 scale with 5 being worst (score 6 If unable to do)  At rest 0  Simple tasks - showers, clothes change, eating, shaving 0  Household (dishes, doing bed, laundry) 0  Shopping 0  Walking level at own pace 0  Walking up Stairs 0  Total (30-36) Dyspnea Score 0  How bad is your cough? 0  How bad is your fatigue yes  How bad is nausea 0  How bad is vomiting?  0  How bad is diarrhea? 0  How bad is anxiety? ?  How bad is depression ?      Past Medical History : Positive for rheumatoid arthritis diagnosed in 2022.  She has history of irregular heart rate not otherwise specified.  She sees Dr. [name].  She says  she has had echocardiogram and stress test 2010 years ago.  She is on Lopressor.  She has coronary artery calcification on the CT but she says she sees a cardiologist regularly.  She also has GERD and hiatal hernia.  She has diabetes she has thyroid disease not otherwise specified.  She has obesity   has a past medical history of Acute renal insufficiency (05/31/2014), Anemia, iron deficiency, Anxiety, Bilateral lower extremity edema, Cervical dysplasia, Colon cancer screening (06/14/2014), Coronary artery disease, Cough (08/08/2012), Degenerative disk disease (11/19/2011), Depression, Diabetes mellitus type II, Diverticulosis, DJD (degenerative joint disease), Drug rash (05/22/2011), Dry eyes, Dysrhythmia,  Edema, Elevated liver enzymes (03/21/2012), Encounter for routine gynecological examination (06/14/2014), ESOPHAGITIS (03/28/2007), Fibromyalgia, GASTRITIS (03/28/2007), GERD (gastroesophageal reflux disease), Hemorrhoids, HLD (hyperlipidemia), HNP (herniated nucleus pulposus) (11/1997), HTN (hypertension), Hyperglycemia (05/13/2008), Hypothyroidism, Interstitial lung disease ([place]), Left ovarian cyst, Osteoarthritis, Osteopenia, Other screening mammogram (08/18/2011), Palpitations, 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   has a past surgical history that includes Hand surgery (Left); Colonoscopy (08/2000); LASIK; Abdominal exploration surgery; ACDF (1991); and Biopsy of skin subcutaneous tissue and/or mucous membrane (Left, 04/08/2021).    ROS: She has significant fatigue she has arthralgia.  She has Raynaud's she has dry eyes dry mouth.  She has acid reflux.     FAMILY HISTORY of LUNG DISEASE: Positive for asthma in the family not otherwise specified but negative for COPD or pulmonary fibrosis or collagen vascular disease or vasculitis   EXPOSURE HISTORY: Smoked between 82 and 1970 to 10 cigarettes/day.  Does not smoke marijuana but no pipe no cocaine no marijuana use.  No intravenous drug use    HOME and HOBBY DETAILS : [place]  She has lived in the home for 63 years the age of the home is 100.  Built in 1969.  No dampness.  No mold or mildew in the house no humidifier use no CPAP use no nebulizer use no steam iron use no Jacuzzi use.  No misting [place] no parakeets inside the house no pet gerbils or hamsters.  No feather pillows or do way.  No mold in the [facility] duct.  No music habits no gardening habits no bird  feathers.  There was mold and mildew in the bathroom at work but that was in the remote past has not worked in many years.  The building was 72 years old   OCCUPATIONAL HISTORY (122 questions) : No organic or inorganic antigen exposure history.  Greater than 122 item   PULMONARY TOXICITY HISTORY (27 items): She did take Macrobid/nitrofurantoin in 2013.  She took Imuran for 8 weeks in 2022       Narrative & Impression  CLINICAL DATA:  71 year old female with history of shortness of breath. History of rheumatoid arthritis. Shortness of breath with activity.   EXAM: CT CHEST WITHOUT CONTRAST   TECHNIQUE: Multidetector CT imaging of the chest was performed following the standard protocol without intravenous contrast. High resolution imaging of the lungs, as well as inspiratory and expiratory imaging, was performed.   COMPARISON:  Chest CT 04/03/2012.   FINDINGS: Cardiovascular: Heart size is normal. There is no significant pericardial fluid, thickening or pericardial calcification. There is aortic atherosclerosis, as well as atherosclerosis of the great vessels of the mediastinum and the coronary arteries, including calcified atherosclerotic plaque in the  left main, left anterior descending and right coronary arteries.   Mediastinum/Nodes: No pathologically enlarged mediastinal or hilar lymph nodes. Please note that accurate exclusion of hilar adenopathy is limited on noncontrast CT scans. Esophagus is unremarkable in appearance. No axillary lymphadenopathy.   Lungs/Pleura: High-resolution images demonstrate patchy areas of ground-glass attenuation, septal thickening, mild subpleural reticulation and peripheral bronchiolectasis scattered throughout the lungs bilaterally. Findings have a definitive craniocaudal gradient and appear mildly progressive compared to the prior study, with slightly less ground-glass attenuation but more septal thickening and subpleural  reticulation. No frank honeycombing. Inspiratory and expiratory imaging demonstrates some mild air trapping indicative of small airways disease. In addition, there is collapse of the trachea and mainstem bronchi during expiration. No acute consolidative airspace disease. No pleural effusions. No suspicious appearing pulmonary nodules or masses are noted.   Upper Abdomen: Unremarkable.   Musculoskeletal: There are no aggressive appearing lytic or blastic lesions noted in the visualized portions of the skeleton.   IMPRESSION: 1. The appearance of the lungs is compatible with interstitial lung disease which is mildly progressive when compared to remote prior study from 9 years ago. The spectrum of findings on today's examination is categorized as probable usual interstitial pneumonia (UIP) per current ATS guidelines. 2. Aortic atherosclerosis, in addition to left main and 2 vessel coronary artery disease. Assessment for potential risk factor modification, dietary therapy or pharmacologic therapy may be warranted, if clinically indicated.   Aortic Atherosclerosis (ICD10-I70.0).     Electronically Signed   By: Vinnie Langton M.D.   On: 01/19/2021 18:20       OV 03/26/2022  Subjective:  Patient ID: Tina Berger, female , DOB: April 29, 1951 , age 57 y.o. , MRN: [id number] , ADDRESS: [place] [id number] PCP Tower, [name], MD Patient Care Team: Tower, [name], MD as PCP - General [name], [name], MD as Consulting Physician (Cardiology) [name], [facility] as Pharmacist (Pharmacist)  This Provider for this visit: Treatment Team:  Attending Provider: Brand Males, MD    03/26/2022 -   Chief Complaint  Patient presents with   Follow-up    PFT performed today.  Pt states she has been doing okay since last visit and denies any complaints.   Follow-up rheumatoid arthritis ILD -Mild disease burden  - Not on antifibrotic High risk prescription with  immunosuppressed status on Actemra -since October 2022.  HPI LIAM BOSSMAN 71 y.o. -presents for follow-up.  She is on observation from a pulmonary perspective for interstitial lung disease.  This is because she has mild disease burden and we do not know if it is progressive phenotype as yet.  Since her last visit from a respiratory standpoint she says she is stable.  In fact her lung function is showing stability with FVC slightly better and the DLCO being the same.  They actually look normal.  Her main issue is that she is continuing to have significant issues with rheumatoid arthritis.  She did not tolerate leflunomide it caused some knuckle swelling confusing it for rheumatoid arthritis itself but when she stopped leflunomide approximately a month ago this got better.  She is on Actemra which she is tolerating well.  She has been on Actemra since October 2022 she is having significant joint pain.  Early in the morning her pain can be 5 out of 5.  She follows with Dr. Keturah Barre for this.  Her pulmonary function test and walking desaturation test are stable.    SYMPTOM SCALE -  ILD 06/02/2021  03/26/2022   O2 use ra ra  Shortness of Breath 0 -> 5 scale with 5 being worst (score 6 If unable to do)   At rest 0 0  Simple tasks - showers, clothes change, eating, shaving 0 1  Household (dishes, doing bed, laundry) 0 2  Shopping 0 2  Walking level at own pace 0 2  Walking up Stairs 0 3  Total (30-36) Dyspnea Score 0 10  How bad is your cough? 0 0  How bad is your fatigue yes 4  How bad is nausea 0 1  How bad is vomiting?  0 0  How bad is diarrhea? 0 0  How bad is anxiety? ? 2  How bad is depression ? 2  Pain  5   CT Chest data  No results found.   Simple office walk 185 feet x  3 laps goal with forehead probe 06/02/2021  03/26/2022   O2 used ra   Number laps completed 3   Comments about pace avg   Resting Pulse Ox/HR 97% and 71/min 95% and HR 80  Final Pulse Ox/HR 93% and 105/min 93% and HR  107  Desaturated </= 88% no no  Desaturated <= 3% points Yes 4 points Yes 2 points  Got Tachycardic >/= 90/min yes yes  Symptoms at end of test Moderate dyspnea Mild dyspnea   Miscellaneous comments x      PFT     Latest Ref Rng & Units 03/26/2022   10:15 AM 06/02/2021   12:50 PM  PFT Results  FVC-Pre L 2.35  P 2.16   FVC-Predicted Pre % 91  P 79   FVC-Post L  2.14   FVC-Predicted Post %  78   Pre FEV1/FVC % % 85  P 81   Post FEV1/FCV % %  86   FEV1-Pre L 2.00  P 1.75   FEV1-Predicted Pre % 103  P 85   FEV1-Post L  1.83   DLCO uncorrected ml/min/mmHg 15.17  P 14.84   DLCO UNC% % 87  P 81   DLCO corrected ml/min/mmHg 14.70  P 14.84   DLCO COR %Predicted % 84  P 81   DLVA Predicted % 86  P 104   TLC L  4.29   TLC % Predicted %  90   RV % Predicted %  89     P Preliminary result       has a past medical history of Acute renal insufficiency (05/31/2014), Anemia, iron deficiency, Anxiety, Bilateral lower extremity edema, Cervical dysplasia, Colon cancer screening (06/14/2014), Coronary artery disease, Cough (08/08/2012), Degenerative disk disease (11/19/2011), Depression, Diabetes mellitus type II, Diverticulosis, DJD (degenerative joint disease), Drug rash (05/22/2011), Dry eyes, Dysrhythmia, Edema, Elevated liver enzymes (03/21/2012), Encounter for routine gynecological examination (06/14/2014), ESOPHAGITIS (03/28/2007), Fibromyalgia, GASTRITIS (03/28/2007), GERD (gastroesophageal reflux disease), Hemorrhoids, HLD (hyperlipidemia), HNP (herniated nucleus pulposus) (11/1997), HTN (hypertension), Hyperglycemia (05/13/2008), Hypothyroidism, Interstitial lung disease ([place]), Left ovarian cyst, Osteoarthritis, Osteopenia, Other screening mammogram (08/18/2011), Palpitations, 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   reports that she quit smoking about 53 years ago. Her smoking use included cigarettes. She has a 5.00 pack-year smoking history. She has never been exposed to tobacco smoke. She has never used smokeless tobacco.  Past Surgical History:  Procedure Laterality Date   ABDOMINAL  EXPLORATION SURGERY     ACDF  1991   BIOPSY OF SKIN SUBCUTANEOUS TISSUE AND/OR MUCOUS MEMBRANE Left 04/08/2021   Procedure: BIOPSY OF SKIN SUBCUTANEOUS TISSUE AND/OR MUCOUS MEMBRANE ( REMOVAL SKIN NODULE);  Surgeon: Katha Cabal, MD;  Location: [facility] ORS;  Service: Vascular;  Laterality: Left;   COLONOSCOPY  08/2000   Diverticulosis; hemorrhoids   HAND SURGERY Left    left thumb. PIN REMOVED   LASIK     bilateral    Allergies  Allergen Reactions   Ceftin [Cefuroxime] Swelling    Swelling, "legs turn blue".  Legs swelling, then blue, then a rash   Clonidine Derivatives     Swelling of lips   Erythromycin Swelling    Rash, swollen gums    Keflex [Cephalexin] Anaphylaxis    Chest was broken out in a rash. Lips were swelling. Took a few days to occur, but it kept getting worse.                                                                                                                                                                                                   Penicillins Anaphylaxis    Pt had a daughter with an allergy to this at a young age. Patient developed blisters anywhere her child touched her. Also, if she used the bathroom after daughter did while on keflex, it would cause a reaction for her. Lips and roof of mouth swelled up also.   Sulfonamide Derivatives Swelling    Rash, swollen gums, lips   Ciprofloxacin     REACTION: ? rash vs sun rxn. Rash    Fluoxetine Hcl     REACTION: stomach problems. Severe pain in abdomen. Could not function d/t pain   Furosemide Swelling    REACTION: swelling worsened in legs once  taking   Gabapentin Other (See Comments)    REACTION: edema of feet. Unable to get shoes on   Paroxetine     REACTION: weight gain (30 pound weight gain   Pregabalin     REACTION: swelling of feet and legs. Unable to get shoes on.   Tetracycline     REACTION:inflammed genitals.    Venlafaxine     REACTION: sweating profusely   Etodolac     REACTION: reaction not known   Macrobid [Nitrofurantoin]     REACTION: ? Lung problem    Amitriptyline Hcl     REACTION: sedating   Atorvastatin     REACTION: muscle twitch and pain   Benicar [Olmesartan Medoxomil]     Muscle pain    Cephalexin Hives  and Rash   Cetirizine Hcl     REACTION: headache   Diovan [Valsartan]     Thought it made her feel confused   Naproxen Sodium Other (See Comments)    REACTION: edema of feet and legs.  Not good for ckd    Immunization History  Administered Date(s) Administered   Fluad Quad(high Dose 65+) 08/07/2020   Influenza Split 06/19/2011   Influenza Whole 08/01/2009   Influenza, High Dose Seasonal PF 08/14/2018, 07/03/2019   Influenza,inj,Quad PF,6+ Mos 06/14/2014, 08/25/2015, 07/26/2016, 08/19/2017   Moderna Sars-Covid-2 Vaccination 09/17/2020   PFIZER(Purple Top)SARS-COV-2 Vaccination 11/27/2019, 12/18/2019   Pneumococcal Conjugate-13 08/25/2015   Pneumococcal Polysaccharide-23 08/25/2016   Td 11/25/2003   Tdap 06/14/2014   Zoster, Live 09/16/2014    Family History  Problem Relation Age of Onset   Emphysema Father        + smoker   Lung cancer Mother        + smoker   Coronary artery disease Mother        relatively young   Lymphoma Sister    Heart disease Brother    Lymphoma Brother    Parkinson's disease Brother    Anemia Brother        aplastic    Diabetes Brother    Leukemia Son    Iron deficiency Daughter    Breast cancer Neg Hx      Current Outpatient Medications:    Acetaminophen (TYLENOL ARTHRITIS PAIN PO), Take 650 mg by mouth daily., Disp: , Rfl:    buPROPion  (WELLBUTRIN XL) 150 MG 24 hr tablet, Take 1 tablet (150 mg total) by mouth daily., Disp: 90 tablet, Rfl: 0   Chlorphen-Phenyleph-ASA (ALKA-SELTZER PLUS COLD PO), Take by mouth as needed. Just for colds, Disp: , Rfl:    cholecalciferol (VITAMIN D3) 25 MCG (1000 UNIT) tablet, Take 1,000 Units by mouth daily., Disp: , Rfl:    cyclobenzaprine (FLEXERIL) 10 MG tablet, TAKE 1/2 TO 1 TABLET BY  MOUTH ONCE DAILY AND 1  TABLET AT BEDTIME, Disp: 180 tablet, Rfl: 1   Diclofenac Sodium 1.5 % SOLN, Place 2 mLs onto the skin 4 (four) times daily., Disp: 3 Bottle, Rfl: 3   famotidine (PEPCID) 40 MG tablet, Take 1 tablet (40 mg total) by mouth daily., Disp: 90 tablet, Rfl: 0   ferrous sulfate 325 (65 FE) MG EC tablet, Take 325 mg by mouth daily with breakfast., Disp: , Rfl:    FLUAD QUADRIVALENT 0.5 ML injection, , Disp: , Rfl:    glucose blood test strip, One Touch Ultra stripts blue-To check sugar once daily and as needed for DM2 250.00, Disp: 100 each, Rfl: 3   GUAIFENESIN CR PO, Take by mouth as needed., Disp: , Rfl:    HYDROmorphone HCl (EXALGO) 12 MG T24A SR tablet, Take 12 mg by mouth 2 (two) times daily., Disp: , Rfl:    hyoscyamine (LEVSIN SL) 0.125 MG SL tablet, Take 1 tablet (0.125 mg total) by mouth every 4 (four) hours as needed for cramping., Disp: 270 tablet, Rfl: 0   ketoconazole (NIZORAL) 2 % shampoo, SHAMPOO WITH A SMALL AMOUNT AS DIRECTED THREE TIMES A WEEK SHAMPOO SCALP 3 DAYS A WEEK, LET SHAMPOO SIT FOR 10 MINUTES AND RINSE OFF, Disp: 120 mL, Rfl: 2   levothyroxine (SYNTHROID) 150 MCG tablet, Take 1 tablet (150 mcg total) by mouth daily., Disp: 90 tablet, Rfl: 3   LYSINE PO, Take by mouth as needed. OTC for ulcer prophylaxis, Disp: , Rfl:  Magnesium 400 MG CAPS, Take by mouth daily., Disp: , Rfl:    metFORMIN (GLUCOPHAGE) 500 MG tablet, TAKE 1 TABLET BY MOUTH TWICE  DAILY WITH MEALS, Disp: 180 tablet, Rfl: 2   metoprolol succinate (TOPROL-XL) 50 MG 24 hr tablet, Take 1 tablet (50 mg total) by  mouth 2 (two) times daily. Take with or immediately following a meal., Disp: 180 tablet, Rfl: 0   ondansetron (ZOFRAN) 4 MG tablet, Take 1 tablet (4 mg total) by mouth every 8 (eight) hours as needed for nausea or vomiting., Disp: 20 tablet, Rfl: 0   oxyCODONE (OXYCONTIN) 10 MG 12 hr tablet, Take 10 mg by mouth 3 (three) times daily as needed. Beakthrough pain, Disp: , Rfl:    predniSONE (DELTASONE) 1 MG tablet, TAKE 1 TABLET BY MOUTH ONCE WITH BREAKFAST , ALONG WITH '5MG'$  TO EQUAL '6MG'$  DAILY, Disp: 30 tablet, Rfl: 0   predniSONE (DELTASONE) 5 MG tablet, TAKE 1 TABLET BY MOUTH EVERY DAY, Disp: 30 tablet, Rfl: 0   ramipril (ALTACE) 5 MG capsule, Take 1 capsule (5 mg total) by mouth daily., Disp: 90 capsule, Rfl: 0   rosuvastatin (CRESTOR) 5 MG tablet, TAKE 1 TABLET BY MOUTH TWICE WEEKLY, Disp: 26 tablet, Rfl: 0   Tocilizumab (ACTEMRA ACTPEN) 162 MG/0.9ML SOAJ, Inject 162 mg into the skin every 14 (fourteen) days., Disp: 5.4 mL, Rfl: 0   leflunomide (ARAVA) 10 MG tablet, Take one tablet po daily x 2 weeks. If labs are stable increase to 20 mg po daily. (Patient not taking: Reported on 03/23/2022), Disp: 42 tablet, Rfl: 0      Objective:   Vitals:   03/26/22 1102  BP: 126/80  Pulse: 80  SpO2: 95%  Weight: 188 lb 12.8 oz (85.6 kg)  Height: '5\' 1"'$  (1.549 m)    Estimated body mass index is 35.67 kg/m as calculated from the following:   Height as of this encounter: '5\' 1"'$  (1.549 m).   Weight as of this encounter: 188 lb 12.8 oz (85.6 kg).  '@WEIGHTCHANGE'$ @  [employer]   03/26/22 1102  Weight: 188 lb 12.8 oz (85.6 kg)     Physical ExamGeneral: No distress. obsee Neuro: Alert and Oriented x 3. GCS 15. Speech normal Psych: Pleasant Resp:  Barrel Chest - no.  Wheeze - no, Crackles - yes, No overt respiratory distress CVS: Normal heart sounds. Murmurs - no Ext: Stigmata of Connective Tissue Disease - no HEENT: Normal upper airway. PEERL +. No post nasal drip        Assessment:        ICD-10-CM   1. Interstitial lung disease due to connective tissue disease ([place])  J84.89    M35.9     2. Drug allergy, multiple  Z88.9     3. Rheumatoid arthritis of multiple sites with negative rheumatoid factor ([place])  M06.09          Plan:     Patient Instructions  Interstitial lung disease due to connective tissue disease ([place]) Drug allergy, multiple  Rheumatoid arthritis of multiple sites with negative rheumatoid factor ([place]) Raynaud's disease without gangrene  -You have very mild interstitial lung disease and this is due to rheumatoid arthritis   - [based on symptoms, walk test, pulmonary function test and CT scan] -Since 2013 there is very mild progression through 2022  - aug 2022 -> June 2023 is stable   Plan - continue supportive care  - continue actemra through Dr D for RA   - can be beneficial  to lungs -Do repeat pulmonary function test spirometry and DLCO in 6 months [can be done at [facility] in [place] - FLu and RSV shot (any inactivated but not live vaccine) - avoid respiratory infections - mask up indoors and human clusters   Follow-up - 6 months do spirometry and DLCO on return to see Dr. Chase Caller for follow-up in a 15-minute slot  -Symptom score and simple walking desaturation test at follow-up     SIGNATURE    Dr. Brand Males, M.D., F.C.C.P,  Pulmonary and Critical Care Medicine Staff Physician, [place] Director - Interstitial Lung Disease  Program  Pulmonary [place] [facility] [place], [place], [address]  Pager: [phone], If no answer or between  15:00h - 7:00h: call [phone] Telephone: [phone]  4:03 PM 03/26/2022

## 2022-03-29 ENCOUNTER — Telehealth: Payer: Medicare Other

## 2022-03-29 ENCOUNTER — Ambulatory Visit: Payer: Medicare Other | Admitting: Rheumatology

## 2022-03-29 ENCOUNTER — Encounter: Payer: Self-pay | Admitting: Rheumatology

## 2022-03-29 ENCOUNTER — Other Ambulatory Visit: Payer: Self-pay | Admitting: Family Medicine

## 2022-03-29 VITALS — BP 153/86 | HR 97 | Resp 17 | Ht 61.5 in | Wt 188.0 lb

## 2022-03-29 DIAGNOSIS — M797 Fibromyalgia: Secondary | ICD-10-CM

## 2022-03-29 DIAGNOSIS — M5136 Other intervertebral disc degeneration, lumbar region: Secondary | ICD-10-CM

## 2022-03-29 DIAGNOSIS — M51369 Other intervertebral disc degeneration, lumbar region without mention of lumbar back pain or lower extremity pain: Secondary | ICD-10-CM

## 2022-03-29 DIAGNOSIS — F418 Other specified anxiety disorders: Secondary | ICD-10-CM

## 2022-03-29 DIAGNOSIS — M5134 Other intervertebral disc degeneration, thoracic region: Secondary | ICD-10-CM

## 2022-03-29 DIAGNOSIS — M170 Bilateral primary osteoarthritis of knee: Secondary | ICD-10-CM | POA: Diagnosis not present

## 2022-03-29 DIAGNOSIS — J849 Interstitial pulmonary disease, unspecified: Secondary | ICD-10-CM | POA: Diagnosis not present

## 2022-03-29 DIAGNOSIS — M25512 Pain in left shoulder: Secondary | ICD-10-CM

## 2022-03-29 DIAGNOSIS — Z981 Arthrodesis status: Secondary | ICD-10-CM

## 2022-03-29 DIAGNOSIS — G894 Chronic pain syndrome: Secondary | ICD-10-CM

## 2022-03-29 DIAGNOSIS — I10 Essential (primary) hypertension: Secondary | ICD-10-CM

## 2022-03-29 DIAGNOSIS — Z8774 Personal history of (corrected) congenital malformations of heart and circulatory system: Secondary | ICD-10-CM

## 2022-03-29 DIAGNOSIS — Z79899 Other long term (current) drug therapy: Secondary | ICD-10-CM | POA: Diagnosis not present

## 2022-03-29 DIAGNOSIS — Z8719 Personal history of other diseases of the digestive system: Secondary | ICD-10-CM

## 2022-03-29 DIAGNOSIS — M0609 Rheumatoid arthritis without rheumatoid factor, multiple sites: Secondary | ICD-10-CM

## 2022-03-29 DIAGNOSIS — E785 Hyperlipidemia, unspecified: Secondary | ICD-10-CM

## 2022-03-29 DIAGNOSIS — G8929 Other chronic pain: Secondary | ICD-10-CM

## 2022-03-29 DIAGNOSIS — I7300 Raynaud's syndrome without gangrene: Secondary | ICD-10-CM | POA: Diagnosis not present

## 2022-03-29 DIAGNOSIS — E119 Type 2 diabetes mellitus without complications: Secondary | ICD-10-CM

## 2022-03-29 DIAGNOSIS — I83029 Varicose veins of left lower extremity with ulcer of unspecified site: Secondary | ICD-10-CM

## 2022-03-29 DIAGNOSIS — Z79891 Long term (current) use of opiate analgesic: Secondary | ICD-10-CM

## 2022-03-29 DIAGNOSIS — E1169 Type 2 diabetes mellitus with other specified complication: Secondary | ICD-10-CM

## 2022-03-29 DIAGNOSIS — M5030 Other cervical disc degeneration, unspecified cervical region: Secondary | ICD-10-CM | POA: Diagnosis not present

## 2022-03-29 DIAGNOSIS — I059 Rheumatic mitral valve disease, unspecified: Secondary | ICD-10-CM

## 2022-03-29 DIAGNOSIS — N1831 Chronic kidney disease, stage 3a: Secondary | ICD-10-CM

## 2022-03-29 DIAGNOSIS — L97929 Non-pressure chronic ulcer of unspecified part of left lower leg with unspecified severity: Secondary | ICD-10-CM

## 2022-03-29 DIAGNOSIS — Z8639 Personal history of other endocrine, nutritional and metabolic disease: Secondary | ICD-10-CM

## 2022-03-29 MED ORDER — HYDROXYCHLOROQUINE SULFATE 200 MG PO TABS: ORAL_TABLET | ORAL | 2 refills | Status: DC

## 2022-03-29 NOTE — Patient Instructions (Addendum)
Please increase prednisone to 15 mg by mouth daily for 2 days, 10 mg by mouth daily for 2 days and then resume prednisone 6 mg daily  Hydroxychloroquine Tablets What is this medication? HYDROXYCHLOROQUINE (hye drox ee KLOR oh kwin) treats autoimmune conditions, such as rheumatoid arthritis and lupus. It works by slowing down an overactive immune system. It may also be used to prevent and treat malaria. It works by killing the parasite that causes malaria. It belongs to a group of medications called DMARDs. This medicine may be used for other purposes; ask your health care provider or pharmacist if you have questions. COMMON BRAND NAME(S): Plaquenil, Quineprox What should I tell my care team before I take this medication? They need to know if you have any of these conditions: Diabetes Eye disease, vision problems G6PD deficiency Heart disease History of irregular heartbeat If you often drink alcohol Kidney disease Liver disease Porphyria Psoriasis An unusual or allergic reaction to chloroquine, hydroxychloroquine, other medications, foods, dyes, or preservatives Pregnant or trying to get pregnant Breast-feeding How should I use this medication? Take this medication by mouth with a glass of water. Take it as directed on the prescription label. Do not cut, crush or chew this medication. Swallow the tablets whole. Take it with food. Do not take it more than directed. Take all of this medication unless your care team tells you to stop it early. Keep taking it even if you think you are better. Take products with antacids in them at a different time of day than this medication. Take this medication 4 hours before or 4 hours after antacids. Talk to your care team if you have questions. Talk to your care team about the use of this medication in children. While this medication may be prescribed for selected conditions, precautions do apply. Overdosage: If you think you have taken too much of this  medicine contact a poison control center or emergency room at once. NOTE: This medicine is only for you. Do not share this medicine with others. What if I miss a dose? If you miss a dose, take it as soon as you can. If it is almost time for your next dose, take only that dose. Do not take double or extra doses. What may interact with this medication? Do not take this medication with any of the following: Cisapride Dronedarone Pimozide Thioridazine This medication may also interact with the following: Ampicillin Antacids Cimetidine Cyclosporine Digoxin Kaolin Medications for diabetes, like insulin, glipizide, glyburide Medications for seizures like carbamazepine, phenobarbital, phenytoin Mefloquine Methotrexate Other medications that prolong the QT interval (cause an abnormal heart rhythm) Praziquantel This list may not describe all possible interactions. Give your health care provider a list of all the medicines, herbs, non-prescription drugs, or dietary supplements you use. Also tell them if you smoke, drink alcohol, or use illegal drugs. Some items may interact with your medicine. What should I watch for while using this medication? Visit your care team for regular checks on your progress. Tell your care team if your symptoms do not start to get better or if they get worse. You may need blood work done while you are taking this medication. If you take other medications that can affect heart rhythm, you may need more testing. Talk to your care team if you have questions. Your vision may be tested before and during use of this medication. Tell your care team right away if you have any change in your eyesight. This medication may cause serious skin  reactions. They can happen weeks to months after starting the medication. Contact your care team right away if you notice fevers or flu-like symptoms with a rash. The rash may be red or purple and then turn into blisters or peeling of the skin.  Or, you might notice a red rash with swelling of the face, lips or lymph nodes in your neck or under your arms. If you or your family notice any changes in your behavior, such as new or worsening depression, thoughts of harming yourself, anxiety, or other unusual or disturbing thoughts, or memory loss, call your care team right away. What side effects may I notice from receiving this medication? Side effects that you should report to your care team as soon as possible: Allergic reactions--skin rash, itching, hives, swelling of the face, lips, tongue, or throat Aplastic anemia--unusual weakness or fatigue, dizziness, headache, trouble breathing, increased bleeding or bruising Change in vision Heart rhythm changes--fast or irregular heartbeat, dizziness, feeling faint or lightheaded, chest pain, trouble breathing Infection--fever, chills, cough, or sore throat Low blood sugar (hypoglycemia)--tremors or shaking, anxiety, sweating, cold or clammy skin, confusion, dizziness, rapid heartbeat Muscle injury--unusual weakness or fatigue, muscle pain, dark yellow or brown urine, decrease in amount of urine Pain, tingling, or numbness in the hands or feet Rash, fever, and swollen lymph nodes Redness, blistering, peeling, or loosening of the skin, including inside the mouth Thoughts of suicide or self-harm, worsening mood, or feelings of depression Unusual bruising or bleeding Side effects that usually do not require medical attention (report to your care team if they continue or are bothersome): Diarrhea Headache Nausea Stomach pain Vomiting This list may not describe all possible side effects. Call your doctor for medical advice about side effects. You may report side effects to FDA at 1-800-FDA-1088. Where should I keep my medication? Keep out of the reach of children and pets. Store at room temperature up to 30 degrees C (86 degrees F). Protect from light. Get rid of any unused medication after the  expiration date. To get rid of medications that are no longer needed or have expired: Take the medication to a medication take-back program. Check with your pharmacy or law enforcement to find a location. If you cannot return the medication, check the label or package insert to see if the medication should be thrown out in the garbage or flushed down the toilet. If you are not sure, ask your care team. If it is safe to put it in the trash, empty the medication out of the container. Mix the medication with cat litter, dirt, coffee grounds, or other unwanted substance. Seal the mixture in a bag or container. Put it in the trash. NOTE: This sheet is a summary. It may not cover all possible information. If you have questions about this medicine, talk to your doctor, pharmacist, or health care provider.  2023 Elsevier/Gold Standard (2021-02-19 00:00:00)

## 2022-03-30 ENCOUNTER — Encounter: Payer: Self-pay | Admitting: Family Medicine

## 2022-03-30 ENCOUNTER — Telehealth: Payer: Self-pay

## 2022-03-30 NOTE — Chronic Care Management (AMB) (Cosign Needed Addendum)
Chronic Care Management Pharmacy Assistant   Name: Tina Berger  MRN: [id number] DOB: 1950/11/15  Reason for Encounter: Reminder Call   Conditions to be addressed/monitored: CAD, HTN, HLD, and DMII   Recent office visits:  03/23/22-Marne Tower,MD(PCP)- possible UTI,labs ordered(UA positive,A1c 6.6, labs fairly stable)Fosfomycin sent to pharmacy   Recent consult visits:  03/29/22-Shaili Deveshwar,MD(rheum)-joint swelling,advised her to try half a pill of hydroxychloroquine and if tolerated she can increase the dose to 200 mg p.o. twice daily Monday to Friday. I advised her to increase prednisone to 15 mg p.o. daily for 2 days, 10 mg p.o. daily for 2 days and then resume prednisone 6 mg p.o. daily. F/u 2 months 03/26/22-Murali Ramaswamy,MD(pulmonary)-f/u lung disease,PFT stable,no medication changes f/u 6 months with spirometry 02/03/22-Taylor Dale,PA(rheum)-Left shoulder cortisone injection,f/u 6 weeks 01/11/22-Podiatry-Max Hyatt,DPM-Hammer toe pain-xrays- no medication changes 11/19/21-Rheumatology-Taylor Dale,PA-follow up Rheumatoid arthritis -tolerating Actemra-She remains on prednisone 10 mg daily.  We discussed starting to taper prednisone by 1 mg every 2 weeks as tolerated. 11/07/21-[facility] ED-Dylan Smith,MD-Bad Headache-CT head normal, trigger point injections given-discharged to home  10/06/21-Rheumatology-Taylor Dale,PA- Received first dose of [place] Hospital visits:  None in previous 6 months  Medications: Outpatient Encounter Medications as of 03/30/2022  Medication Sig   Acetaminophen (TYLENOL ARTHRITIS PAIN PO) Take 650 mg by mouth daily.   buPROPion (WELLBUTRIN XL) 150 MG 24 hr tablet Take 1 tablet (150 mg total) by mouth daily.   Chlorphen-Phenyleph-ASA (ALKA-SELTZER PLUS COLD PO) Take by mouth as needed. Just for colds   cholecalciferol (VITAMIN D3) 25 MCG (1000 UNIT) tablet Take 1,000 Units by mouth daily.   cyclobenzaprine (FLEXERIL) 5 MG tablet Take 5 mg by mouth daily as  needed.   Diclofenac Sodium 1.5 % SOLN Place 2 mLs onto the skin 4 (four) times daily.   famotidine (PEPCID) 40 MG tablet Take 1 tablet (40 mg total) by mouth daily.   ferrous sulfate 325 (65 FE) MG EC tablet Take 325 mg by mouth daily with breakfast.   FLUAD QUADRIVALENT 0.5 ML injection  (Patient not taking: Reported on 03/29/2022)   glucose blood test strip One Touch Ultra stripts blue-To check sugar once daily and as needed for DM2 250.00   GUAIFENESIN CR PO Take by mouth as needed.   HYDROmorphone HCl (EXALGO) 12 MG T24A SR tablet Take 12 mg by mouth 2 (two) times daily.   hydroxychloroquine (PLAQUENIL) 200 MG tablet Take 1 tablet 200 mg BID Monday-Friday   hyoscyamine (LEVSIN SL) 0.125 MG SL tablet Take 1 tablet (0.125 mg total) by mouth every 4 (four) hours as needed for cramping.   ketoconazole (NIZORAL) 2 % shampoo SHAMPOO WITH A SMALL AMOUNT AS DIRECTED THREE TIMES A WEEK SHAMPOO SCALP 3 DAYS A WEEK, LET SHAMPOO SIT FOR 10 MINUTES AND RINSE OFF (Patient not taking: Reported on 03/29/2022)   levothyroxine (SYNTHROID) 150 MCG tablet Take 1 tablet (150 mcg total) by mouth daily.   LYSINE PO Take by mouth as needed. OTC for ulcer prophylaxis   Magnesium 400 MG CAPS Take by mouth daily.   metFORMIN (GLUCOPHAGE) 500 MG tablet TAKE 1 TABLET BY MOUTH TWICE  DAILY WITH MEALS   metoprolol succinate (TOPROL-XL) 50 MG 24 hr tablet Take 1 tablet (50 mg total) by mouth 2 (two) times daily. Take with or immediately following a meal.   naloxone (NARCAN) nasal spray 4 mg/0.1 mL SMARTSIG:Both Nares   ondansetron (ZOFRAN) 4 MG tablet Take 1 tablet (4 mg total) by mouth every 8 (eight)  hours as needed for nausea or vomiting.   oxyCODONE (OXYCONTIN) 10 MG 12 hr tablet Take 10 mg by mouth 3 (three) times daily as needed. Beakthrough pain   predniSONE (DELTASONE) 1 MG tablet TAKE 1 TABLET BY MOUTH ONCE WITH BREAKFAST , ALONG WITH '5MG'$  TO EQUAL '6MG'$  DAILY   predniSONE (DELTASONE) 5 MG tablet TAKE 1 TABLET BY MOUTH  EVERY DAY   ramipril (ALTACE) 5 MG capsule Take 1 capsule (5 mg total) by mouth daily.   rosuvastatin (CRESTOR) 5 MG tablet TAKE 1 TABLET BY MOUTH TWICE WEEKLY   Tocilizumab (ACTEMRA ACTPEN) 162 MG/0.9ML SOAJ Inject 162 mg into the skin every 14 (fourteen) days.   No facility-administered encounter medications on file as of 03/30/2022.   Shyia Fillingim Miltner was contacted to remind of upcoming telephone visit with Charlene Brooke on 04/02/22 at 10:15am. Patient was reminded to have any blood glucose and blood pressure readings available for review at appointment.   Message was left reminding patient of appointment.  Patient returned call to reschedule appointment due to conflict to Monday 5/28 @ 10:15am.   CCM referral has been placed prior to visit?  No    Star Rating Drugs: Medication:  Last Fill: Day Supply Metformin '500mg'$  10/12/21 90    [facility] mail order Ramipril '5mg'$   02/09/22 90 Rosuvastatin '5mg'$  02/11/22 [place], CPP notified  Avel Sensor, [place]  [phone]

## 2022-04-01 ENCOUNTER — Telehealth: Payer: Self-pay | Admitting: Rheumatology

## 2022-04-01 NOTE — Telephone Encounter (Signed)
Spoke with patient and advised her that the refill was sent in on 03/17/2022. Patient provided with number [phone] to call and set up shipment. Patient asked if they should be contacting her or she should be contacting them. Patient advised I am not sure how their process works but if she notices she is down to her last pen then she should contact the office and we will work on getting the refill. Patient expressed understanding.

## 2022-04-01 NOTE — Telephone Encounter (Signed)
Patient left a voicemail stating she was in the office last Monday, 03/29/22 for an appointment and spoke with Ivin Booty about her Actemra refill.  Patient states she doesn't feel like she got enough information about how to reorder the medication and requested a return call.

## 2022-04-02 ENCOUNTER — Telehealth: Payer: Medicare Other

## 2022-04-05 ENCOUNTER — Telehealth: Payer: Medicare Other

## 2022-04-05 NOTE — Progress Notes (Deleted)
Chronic Care Management Pharmacy Note  04/05/2022 Name:  Tina Berger MRN:  [id number] DOB:  1951/07/28  Summary: CCM F/U visit ***  Recommendations/Changes made from today's visit: ***  Plan: -[facility] will call patient *** -Pharmacist follow up televisit scheduled for *** -PCP CPE 06/25/22    Subjective: Tina Berger is an 71 y.o. year old female who is a primary patient of Tower, [name], MD.  The CCM team was consulted for assistance with disease management and care coordination needs.    Engaged with patient by telephone for follow up visit in response to provider referral for pharmacy case management and/or care coordination services.   Consent to Services:  The patient was given information about Chronic Care Management services, agreed to services, and gave verbal consent prior to initiation of services.  Please see initial visit note for detailed documentation.   Patient Care Team: Tower, [name], MD as PCP - General [name], [name], MD as Consulting Physician (Cardiology) [name], [facility] as Pharmacist (Pharmacist)  Recent office visits: 03/23/22 Dr Glori Bickers OV: f/u - acute cystitis. Rx'd Fosfomycin. D/C duloxetine.  Recent consult visits: 03/29/22 Dr Estanislado Pandy (Rheumatology) f/u RA. Rx'd hydroxychoroquine. D/C leflunomide (mood changes). Increase prednisone for 6 days.  03/26/22 Dr Chase Caller (Pulmonary): f/u ILD (mild). Contineu supportive care. Repeat PFT 6 months.   Hospital visits: 11/07/21 ED visit [facility]): headache. Trigger point injection given.   Objective:  Lab Results  Component Value Date   CREATININE 0.95 03/04/2022   BUN 19 03/04/2022   GFR 48.18 (L) 11/13/2020   EGFR 64 03/04/2022   GFRNONAA >60 11/07/2021   GFRAA 75 03/24/2021   NA 140 03/04/2022   K 4.7 03/04/2022   CALCIUM 9.5 03/04/2022   CO2 25 03/04/2022   GLUCOSE 135 (H) 03/04/2022    Lab Results  Component Value Date/Time   HGBA1C 6.6 (H) 03/23/2022 11:57 AM    HGBA1C 6.5 08/20/2020 09:51 AM   GFR 48.18 (L) 11/13/2020 08:54 AM   GFR 63.03 09/25/2020 09:34 AM    Last diabetic Eye exam:  Lab Results  Component Value Date/Time   HMDIABEYEEXA No Retinopathy 03/04/2022 12:00 AM    Last diabetic Foot exam:  Lab Results  Component Value Date/Time   HMDIABFOOTEX Done 03/18/2010 12:00 AM     Lab Results  Component Value Date   CHOL 192 03/23/2022   HDL 64.60 03/23/2022   LDLCALC 88 03/23/2022   LDLDIRECT 149.0 07/26/2016   TRIG 197.0 (H) 03/23/2022   CHOLHDL 3 03/23/2022       Latest Ref Rng & Units 03/04/2022   11:26 AM 02/03/2022   11:19 AM 11/07/2021    5:44 AM  Hepatic Function  Total Protein 6.0 - 8.5 g/dL 7.6  7.6  8.1   Albumin 3.7 - 4.7 g/dL 4.6   4.3   AST 0 - 40 IU/L 25  18  21    ALT 0 - 32 IU/L 23  20  22    Alk Phosphatase 44 - 121 IU/L 67   60   Total Bilirubin 0.0 - 1.2 mg/dL 0.2  0.5  0.7     Lab Results  Component Value Date/Time   TSH 3.46 03/23/2022 11:57 AM   TSH 0.36 08/20/2020 09:51 AM   FREET4 1.1 01/24/2007 09:41 AM       Latest Ref Rng & Units 03/04/2022   11:26 AM 02/03/2022   11:19 AM 11/07/2021    5:44 AM  CBC  WBC 3.4 -  10.8 x10E3/uL 7.0  9.0  7.2   Hemoglobin 11.1 - 15.9 g/dL 14.5  14.2  14.2   Hematocrit 34.0 - 46.6 % 43.6  42.7  44.3   Platelets 150 - 450 x10E3/uL 289  339  327     Lab Results  Component Value Date/Time   VD25OH 48.52 08/20/2020 09:51 AM   VD25OH 50.96 08/17/2019 09:32 AM    Clinical ASCVD: Yes  The 10-year ASCVD risk score (Arnett DK, et al., 2019) is: 51.2%   Values used to calculate the score:     Age: 70 years     Sex: Female     Is Non-Hispanic African American: No     Diabetic: Yes     Tobacco smoker: Yes     Systolic Blood Pressure: 509 mmHg     Is BP treated: Yes     HDL Cholesterol: 64.6 mg/dL     Total Cholesterol: 192 mg/dL       03/23/2022   11:25 AM 08/20/2020    3:33 PM 08/16/2019   10:31 AM  Depression screen PHQ 2/9  Decreased Interest 0 0 0   Down, Depressed, Hopeless 0 0 0  PHQ - 2 Score 0 0 0  Altered sleeping  0 0  Tired, decreased energy  0 0  Change in appetite  0 0  Feeling bad or failure about yourself   0 0  Trouble concentrating  0 0  Moving slowly or fidgety/restless  0 0  Suicidal thoughts  0 0  PHQ-9 Score  0 0  Difficult doing work/chores  Not difficult at all Not difficult at all     Social History   Tobacco Use  Smoking Status Former   Packs/day: 1.00   Years: 5.00   Total pack years: 5.00   Types: Cigarettes   Quit date: 10/18/1968   Years since quitting: 53.4   Passive exposure: Never  Smokeless Tobacco Never  Tobacco Comments   quit over 40 years   BP Readings from Last 3 Encounters:  03/29/22 (!) 153/86  03/26/22 126/80  03/23/22 128/82   Pulse Readings from Last 3 Encounters:  03/29/22 97  03/26/22 80  03/23/22 91   Wt Readings from Last 3 Encounters:  03/29/22 188 lb (85.3 kg)  03/26/22 188 lb 12.8 oz (85.6 kg)  03/23/22 187 lb 3.2 oz (84.9 kg)   BMI Readings from Last 3 Encounters:  03/29/22 34.95 kg/m  03/26/22 35.67 kg/m  03/23/22 35.37 kg/m    Assessment/Interventions: Review of patient past medical history, allergies, medications, health status, including review of consultants reports, laboratory and other test data, was performed as part of comprehensive evaluation and provision of chronic care management services.   SDOH:  (Social Determinants of Health) assessments and interventions performed: No  SDOH Screenings   Alcohol Screen: Low Risk  (08/20/2020)   Alcohol Screen    Last Alcohol Screening Score (AUDIT): 0  Depression (PHQ2-9): Low Risk  (03/23/2022)   Depression (PHQ2-9)    PHQ-2 Score: 0  Financial Resource Strain: Low Risk  (09/28/2021)   Overall Financial Resource Strain (CARDIA)    Difficulty of Paying Living Expenses: Not very hard  Food Insecurity: No Food Insecurity (08/20/2020)   Hunger Vital Sign    Worried About Running Out of Food in the Last  Year: Never true    Ran Out of Food in the Last Year: Never true  Housing: Low Risk  (08/20/2020)   [place]  Risk Score: 0  Physical Activity: Sufficiently Active (08/20/2020)   Exercise Vital Sign    Days of Exercise per Week: 5 days    Minutes of Exercise per Session: 60 min  Social Connections: Not on file  Stress: No Stress Concern Present (08/20/2020)   [place]    Feeling of Stress : Not at all  Tobacco Use: Medium Risk (03/29/2022)   Patient History    Smoking Tobacco Use: Former    Smokeless Tobacco Use: Never    Passive Exposure: Never  Transportation Needs: No Transportation Needs (08/20/2020)   PRAPARE - Hydrologist (Medical): No    Lack of Transportation (Non-Medical): No    CCM Care Plan  Allergies  Allergen Reactions   Ceftin [Cefuroxime] Swelling    Swelling, "legs turn blue".  Legs swelling, then blue, then a rash   Clonidine Derivatives     Swelling of lips   Erythromycin Swelling    Rash, swollen gums    Keflex [Cephalexin] Anaphylaxis    Chest was broken out in a rash. Lips were swelling. Took a few days to occur, but it kept getting worse.                                                                                                                                                                                                   Penicillins Anaphylaxis    Pt had a daughter with an allergy to this at a young age. Patient developed blisters anywhere her child touched her. Also, if she used the bathroom after daughter did while on keflex, it would cause a reaction for her. Lips and roof of mouth swelled up also.   Sulfonamide Derivatives Swelling    Rash, swollen gums, lips   Ciprofloxacin     REACTION: ? rash vs sun rxn. Rash    Fluoxetine Hcl     REACTION: stomach problems. Severe pain in abdomen. Could not function d/t pain   Furosemide Swelling     REACTION: swelling worsened in legs once taking   Gabapentin Other (See Comments)    REACTION: edema of feet. Unable to get shoes on   Paroxetine     REACTION: weight gain (30 pound weight gain   Pregabalin     REACTION: swelling of feet and legs. Unable to get shoes on.   Tetracycline     REACTION:inflammed genitals.    Venlafaxine     REACTION: sweating profusely   Etodolac     REACTION: reaction not known  Macrobid [Nitrofurantoin]     REACTION: ? Lung problem    Amitriptyline Hcl     REACTION: sedating   Atorvastatin     REACTION: muscle twitch and pain   Benicar [Olmesartan Medoxomil]     Muscle pain    Cephalexin Hives and Rash   Cetirizine Hcl     REACTION: headache   Diovan [Valsartan]     Thought it made her feel confused   Naproxen Sodium Other (See Comments)    REACTION: edema of feet and legs.  Not good for ckd    Medications Reviewed Today     Reviewed by Bo Merino, MD (Physician) on 03/29/22 at 1154  Med List Status: <None>   Medication Order Taking? Sig Documenting Provider Last Dose Status Informant  Acetaminophen (TYLENOL ARTHRITIS PAIN PO) [id number] Yes Take 650 mg by mouth daily. [provider] Taking Active   buPROPion (WELLBUTRIN XL) 150 MG 24 hr tablet [id number] Yes Take 1 tablet (150 mg total) by mouth daily. Tower, [name], MD Taking Active   Chlorphen-Phenyleph-ASA (ALKA-SELTZER PLUS COLD PO) [id number] Yes Take by mouth as needed. Just for colds [provider] Taking Active Self  cholecalciferol (VITAMIN D3) 25 MCG (1000 UNIT) tablet [id number] Yes Take 1,000 Units by mouth daily. [provider] Taking Active Self  cyclobenzaprine (FLEXERIL) 5 MG tablet [id number] Yes Take 5 mg by mouth daily as needed. [provider] Taking Active   Diclofenac Sodium 1.5 % SOLN [id number] Yes Place 2 mLs onto the skin 4 (four) times daily. Tower, [name], MD Taking Active   famotidine (PEPCID) 40 MG tablet [id number]  Yes Take 1 tablet (40 mg total) by mouth daily. Tower, [name], MD Taking Active   ferrous sulfate 325 (65 FE) MG EC tablet [id number] Yes Take 325 mg by mouth daily with breakfast. [provider] Taking Active   FLUAD QUADRIVALENT 0.5 ML injection [id number] No   Patient not taking: Reported on 03/29/2022   [provider] Not Taking Active   glucose blood test strip [id number] Yes One Touch Ultra stripts blue-To check sugar once daily and as needed for DM2 250.00 Tower, [name], MD Taking Active   GUAIFENESIN CR PO [id number] Yes Take by mouth as needed. [provider] Taking Active   HYDROmorphone HCl (EXALGO) 12 MG T24A SR tablet [id number] Yes Take 12 mg by mouth 2 (two) times daily. [provider] Taking Active Self  hyoscyamine (LEVSIN SL) 0.125 MG SL tablet [id number] Yes Take 1 tablet (0.125 mg total) by mouth every 4 (four) hours as needed for cramping. Tower, [name], MD Taking Active   ketoconazole (NIZORAL) 2 % shampoo [id number] No SHAMPOO WITH A SMALL AMOUNT AS DIRECTED THREE TIMES A WEEK SHAMPOO SCALP 3 DAYS A WEEK, LET SHAMPOO SIT FOR 10 MINUTES AND RINSE OFF  Patient not taking: Reported on 03/29/2022   Laurence Ferrari, [place], MD Not Taking Active   levothyroxine (SYNTHROID) 150 MCG tablet [id number] Yes Take 1 tablet (150 mcg total) by mouth daily. Tower, [name], MD Taking Active   LYSINE PO [id number] Yes Take by mouth as needed. OTC for ulcer prophylaxis [provider] Taking Active   Magnesium 400 MG CAPS [id number] Yes Take by mouth daily. [provider] Taking Active   metFORMIN (GLUCOPHAGE) 500 MG tablet [id number] Yes TAKE 1 TABLET BY MOUTH TWICE  DAILY WITH MEALS Tower, [name], MD Taking Active   metoprolol succinate (TOPROL-XL) 50 MG 24 hr tablet [id number] Yes Take  1 tablet (50 mg total) by mouth 2 (two) times daily. Take with or immediately following a meal. Tower, [name], MD Taking Active   naloxone [facility]) nasal spray 4 mg/0.1 mL  [id number] Yes SMARTSIG:Both Nares [provider] Taking Active   ondansetron (ZOFRAN) 4 MG tablet [id number] Yes Take 1 tablet (4 mg total) by mouth every 8 (eight) hours as needed for nausea or vomiting. Ofilia Neas, PA-C Taking Active   oxyCODONE (OXYCONTIN) 10 MG 12 hr tablet [id number] Yes Take 10 mg by mouth 3 (three) times daily as needed. [place] pain [provider] Taking Active Self  predniSONE (DELTASONE) 1 MG tablet [id number] Yes TAKE 1 TABLET BY MOUTH ONCE WITH BREAKFAST , ALONG WITH 5MG TO EQUAL 6MG DAILY Ofilia Neas, PA-C Taking Active   predniSONE (DELTASONE) 5 MG tablet [id number] Yes TAKE 1 TABLET BY MOUTH EVERY DAY Deveshwar, Abel Presto, MD Taking Active   ramipril (ALTACE) 5 MG capsule [id number] Yes Take 1 capsule (5 mg total) by mouth daily. Tower, [name], MD Taking Active   rosuvastatin (CRESTOR) 5 MG tablet [id number] Yes TAKE 1 TABLET BY MOUTH TWICE WEEKLY Tower, [name], MD Taking Active   Tocilizumab (ACTEMRA ACTPEN) 162 MG/0.9ML SOAJ [id number] Yes Inject 162 mg into the skin every 14 (fourteen) days. Ofilia Neas, PA-C Taking Active             Patient Active Problem List   Diagnosis Date Noted   Dysuria 09/07/2021   Rheumatoid arthritis ([place]) 05/27/2021   Chronic venous insufficiency 05/23/2021   AVM (arteriovenous malformation) 04/04/2021   CAD (coronary artery disease) 01/22/2021   Primary osteoarthritis of both knees 01/12/2021   Positive ANA (antinuclear antibody) 11/17/2020   Joint pain 11/13/2020   Elevated serum creatinine 08/26/2020   Current use of proton pump inhibitor 08/26/2020   Estrogen deficiency 08/21/2018   TMJ (dislocation of temporomandibular joint), initial encounter 05/05/2018   Anterior neck pain 05/05/2018   Chronic pain syndrome 11/09/2017   Chronic upper extremity pain [facility] Area of Pain) (Bilateral) (L>R) 11/09/2017   Fibromyalgia syndrome 11/09/2017   Osteoarthritis 11/09/2017   Osteoarthritis of  lumbar facet joint (Bilateral) 11/09/2017   Lumbar facet arthropathy (Bilateral) 11/09/2017   Lumbar facet syndrome (Bilateral) (L>R) 11/09/2017   Lumbar foraminal stenosis (multilevel) (Bilateral) 11/09/2017   DDD (degenerative disc disease), lumbar 11/09/2017   Thoracic levoscoliosis 11/09/2017   Thoracic facet syndrome (Bilateral) (L>R) 11/09/2017   Thoracic facet arthropathy (Bilateral) (R>L) 11/09/2017   DDD (degenerative disc disease), thoracic 11/09/2017   Thoracolumbar IVDD 11/09/2017   DDD (degenerative disc disease), cervical 11/09/2017   Osteoarthritis of cervical facet (Bilateral) 11/09/2017   Grade 1 Anterolisthesis of C7 over T1 11/09/2017   Cervical foraminal stenosis (Bilateral) 11/09/2017   History of fusion of cervical spine (C5-6 ACDF) 11/09/2017   Cervical facet syndrome (Bilateral) 11/09/2017   Disorder of skeletal system 11/09/2017   Cervical radiculitis (Bilateral) 11/09/2017   Lumbar Epidural lipomatosis 11/09/2017   Chronic sacroiliac joint pain (Bilateral) (L>R) 11/09/2017   Chronic hip pain (Bilateral) (L>R) 11/09/2017   Chronic upper back pain (Primary Area of Pain) (midline) 10/24/2017   Chronic neck pain (Secondary Area of Pain) (Bilateral)  (L>R) 10/24/2017   Chronic low back pain (Fourth Area of Pain) (Bilateral) (L>R) 10/24/2017   Chronic lower extremity pain (Fifth Area of Pain) (Bilateral) (L>R) 10/24/2017   Lumbar Grade 1 Retrolisthesis of L1-2 and L2-3 10/24/2017   Other long term (current) drug therapy 10/24/2017   Other specified  health status 10/24/2017   Long term current use of opiate analgesic 10/24/2017   Long term prescription opiate use 10/24/2017   Opiate use 10/24/2017   DM type 2 (diabetes mellitus, type 2) ([place]) 06/14/2014   Pharmacologic therapy 06/14/2014   Pedal edema    Post-menopausal 06/23/2012   Lumbar disc disease with radiculopathy 11/18/2011   HTN (hypertension) 07/30/2011   Raynaud disease 07/30/2011   Obesity  07/30/2011   Routine general medical examination at a health care facility 03/28/2011   Palpitations 04/27/2010   UTI (urinary tract infection) 03/18/2010   Depression with anxiety 06/14/2008   Vitamin D deficiency 05/13/2008   ANXIETY 03/25/2008   Osteopenia 03/25/2008   Hypothyroidism 03/28/2007   Hyperlipidemia associated with type 2 diabetes mellitus ([place]) 03/28/2007   Other iron deficiency anemias 03/28/2007   PANIC ATTACK 03/28/2007   KERATOCONJUNCTIVITIS SICCA 03/28/2007   Mitral valve disorder 03/28/2007   ABNORMAL HEART RHYTHMS 03/28/2007   Raynaud's syndrome 03/28/2007   GERD 03/28/2007   IBS 03/28/2007   Rosacea 03/28/2007   PLANTAR FASCIITIS 03/28/2007   MIGRAINES, HX OF 03/28/2007    Immunization History  Administered Date(s) Administered   Fluad Quad(high Dose 65+) 08/07/2020   Influenza Split 06/19/2011   Influenza Whole 08/01/2009   Influenza, High Dose Seasonal PF 08/14/2018, 07/03/2019   Influenza,inj,Quad PF,6+ Mos 06/14/2014, 08/25/2015, 07/26/2016, 08/19/2017   Moderna Sars-Covid-2 Vaccination 09/17/2020   PFIZER(Purple Top)SARS-COV-2 Vaccination 11/27/2019, 12/18/2019   Pneumococcal Conjugate-13 08/25/2015   Pneumococcal Polysaccharide-23 08/25/2016   Td 11/25/2003   Tdap 06/14/2014   Zoster, Live 09/16/2014    Conditions to be addressed/monitored:  Hypertension, Hyperlipidemia, Diabetes, Coronary Artery Disease, Hypothyroidism, Depression, Anxiety, Osteopenia, and Rheumatoid arthritis, Chronic pain  There are no care plans that you recently modified to display for this patient.    Medication Assistance: {MEDASSISTANCEINFO:25044}  Compliance/Adherence/Medication fill history: Care Gaps: Mammogram (due 04/05/19) Cologuard (due 09/04/19)  Star-Rating Drugs: Rosuvastatin - PDC 82% Ramipril - PDC 83% Metformin - PDC 67% (LF 10/12/21 x 90 ds)  Medication Access: Within the past 30 days, how often has patient missed a dose of medication?  *** Is a pillbox or other method used to improve adherence? {YES/NO:21197} Factors that may affect medication adherence? {CHL DESC; BARRIERS:21522} Are meds synced by current pharmacy? {YES/NO:21197} Are meds delivered by current pharmacy? {YES/NO:21197} Does patient experience delays in picking up medications due to transportation concerns? {YES/NO:21197}  [facility] Reviewed: Is patient disadvantaged to use [facility]?: {YES/NO:21197} Current Rx insurance plan: *** Name and location of Current pharmacy:  [facility] ([place], [place] [address] [address] Phone: [phone] Fax: [phone]  [id number]: [phone]: [phone] [facility] ) [place] [address] [address]Phone: [phone]: [address] [place]Phone: [phone]: [phone] [facility] services reviewed with patient today?: {YES/NO:21197} Patient requests to transfer care to [facility]?: {YES/NO:21197} Reason patient declined to change pharmacies: {US patient preference:27474}   Care Plan and Follow Up Patient Decision:  {FOLLOWUP:24991}  Plan: {CM FOLLOW UP PFXJO:83254} ***   Current Barriers:  {pharmacybarriers:24917}  Pharmacist Clinical Goal(s):  Patient will {PHARMACYGOALCHOICES:24921}  through collaboration with PharmD and provider.  Interventions: 1:1 collaboration with Tower, [name], MD regarding development and update of comprehensive plan of care as evidenced by provider attestation and co-signature Inter-disciplinary care team collaboration (see longitudinal plan of care) Comprehensive medication review  performed; medication list updated in electronic medical record  Hypertension (BP goal <140/90) -Controlled - per home readings -Current home BP readings: *** -History of palpitations, CAD, bilateral edema followed by cardiology  -Current treatment: Metoprolol succinate 50 mg BID Ramipril 5 mg daily Torsemide 20 mg daily PRN -Medications previously tried: spironolactone, olmesartan, HCTZ, diltiazem, clonidine -Educated on BP goals and benefits of medications for prevention of heart attack, stroke and kidney damage; -Counseled to monitor BP at home weekly -Recommended to continue current medication  Hyperlipidemia / CAD (LDL goal < 70) -Not ideally controlled - LDL 88 (03/2022) above goal; peak 163 in 2010.  -Pt reports prior intolerance to multiple statins. Rosuvastatin 5 mg twice weekly is the first one she has tolerated. We did not discuss additional agents/goals today due to time limitation (30 min appt slot due to pt rescheduling in MyChart). She affirms adherence to twice weekly dosing. -Current treatment: Rosuvastatin 5 mg twice a week -Medications previously tried: atorvastatin -Educated on Cholesterol goals;  -Recommended to continue current medication  Diabetes (A1c goal <7%) -Controlled - A1c 6.6% (03/2022) at goal -Current home glucose readings - she inquired about CGM, discussed insurance coverage requirements/OOP cost. She states it would be unaffordable. She has agreed with Dr. Glori Bickers to check home BG three days a week, at various times of day (1 BF, 1 lunch, 1 evening); none to report today. Discussed monitoring prior to PCP visit. Discussed goals for fasting versus post prandial.  -Denies hypoglycemic/hyperglycemic symptoms -Current medications: Metformin 500 mg BID Testing supplies -Medications previously tried: none  -Current meal patterns: 3 meals/day dinner: tries to eat more chicken than red meats, minimal vegetables  snacks: protein shake or apple, peanut butter,  carrots and celery drinks: primarily water, occasional diet mountain dew; 2 cups coffee/day -Current exercise: walks at big box stores with grocery cart for stability, 30 minutes or so, 3 times/week unless feeling bad -Educated on A1c and blood sugar goals; -Counseled to check feet daily and get yearly eye exams - she is aware eye and foot exam due; PCP visit scheduled for foot exam. Eye exam was rescheduled to May 2023 due to pt illness. -Recommended to continue current medication; Complete eye and foot exam annually.  Depression/Anxiety (Goal: Improve mood) -Controlled - per patient report -She does not see a counselor currently. She has worked through things in the past. She would go if needed but feels good right now. -PHQ9: 0 (03/2022) - minimal depression -GAD7: reports ongoing anxiety, situational stress, no GAD completed -Current treatment: Bupropion 150 mg 24 hr daily (PM) -Medications previously tried/failed: fluoxetine (GI pain), paroxetine (wt gain), venlafaxine (sweating), amitriptyline (sedation), duloxetine -Educated on Benefits of medication for symptom control;Benefits of cognitive-behavioral therapy with or without medication -Recommended to continue current medication  Rheumatoid Arthritis (Goal: Improve symptoms) -{US controlled/uncontrolled:25276} -Pt reports she will be able to transition off prednisone soon. -Follows with rheumatology and pain mgmt -Current treatment  Prednisone 6 mg daily Actemra (Tocilizumab) 162 mg q14 days Hydroxychloroquine 200 mg BID M-F -Medications previously tried: none  -Recommended to continue current medication  Chronic pain (Goal: ***) -{US controlled/uncontrolled:25276} -Follows with pain mgmt. Hx RA, DDD (lumbar, cervical, thoracic), osteoarthritis. -Current treatment  Hydromorphone 12 mg BID Oxycodone 10 mg TID  prn Tylenol 650 mg PRN Diclofenac 1.5% solution Cyclobenzaprine 5 mg PRN Naloxone nasal spray PRN -Medications  previously tried: pregabalin, gabapentin, venlfaxine, naproxen, etodolac, amitriptyline  -{CCMPHARMDINTERVENTION:25122}  Osteopenia (Goal Prevent fractures) -{US controlled/uncontrolled:25276} -Last DEXA Scan: 10/2018   T-Score total hip: -1.5  T-Score lumbar spine: +0.4  10-year probability of major osteoporotic fracture: 14.4%  10-year probability of hip fracture: 1.8% -Patient is not a candidate for pharmacologic treatment -Current treatment  Vitamin D 1000 IU daily -Medications previously tried: ***  -{Osteoporosis Counseling:23892} -{CCMPHARMDINTERVENTION:25122}  Hypothyroidism (Goal: Improve symptoms, TSH T4 WNL) -Controlled -Current treatment  Levothyroxine 150 mcg daily -Medications previously tried: none  -Recommended to continue current medication  GERD (Goal: Improve symptoms) -Controlled, pt reports severe symptoms with missed dose of esomeprazole. She takes it daily in morning and H2 at night. -Current treatment  Esomeprazole 20 mg daily Famotidine 40 mg HS -Medications previously tried: none  -Recommended to continue current medication  Other -  Ferrous sulfate 325 mg - 1 tablet daily with vitamin C Hyoscyamine 0.125 mg SL - 1 PRN cramping Ondansetron 4 mg - 1 q8h PRN n/v (rare use) Lysine Magnesium 400 mg Mucinex Alka seltzer   Patient Goals/Self-Care Activities Patient will:  - {[email or web address]:[id number]}

## 2022-04-06 ENCOUNTER — Telehealth: Payer: Self-pay

## 2022-04-06 NOTE — Telephone Encounter (Signed)
[facility] Received - Shoes ordered and casts released from fabrication hold.  

## 2022-04-08 ENCOUNTER — Telehealth: Payer: Self-pay | Admitting: *Deleted

## 2022-04-08 NOTE — Telephone Encounter (Signed)
Patient reached out to the office requesting a call back.    Attempted to contact patient and unable to reach on home number, phone is busy. Unable to leave a message on cell number, voicemail is full.

## 2022-04-12 ENCOUNTER — Other Ambulatory Visit: Payer: Self-pay | Admitting: Family Medicine

## 2022-04-12 ENCOUNTER — Other Ambulatory Visit: Payer: Self-pay | Admitting: Rheumatology

## 2022-04-13 ENCOUNTER — Other Ambulatory Visit: Payer: Self-pay | Admitting: Physician Assistant

## 2022-04-13 ENCOUNTER — Telehealth: Payer: Medicare Other

## 2022-04-15 ENCOUNTER — Other Ambulatory Visit: Payer: Self-pay | Admitting: Family Medicine

## 2022-04-22 DIAGNOSIS — K5903 Drug induced constipation: Secondary | ICD-10-CM | POA: Diagnosis not present

## 2022-04-22 DIAGNOSIS — M4725 Other spondylosis with radiculopathy, thoracolumbar region: Secondary | ICD-10-CM | POA: Diagnosis not present

## 2022-04-22 DIAGNOSIS — Z79891 Long term (current) use of opiate analgesic: Secondary | ICD-10-CM | POA: Diagnosis not present

## 2022-04-22 DIAGNOSIS — M5134 Other intervertebral disc degeneration, thoracic region: Secondary | ICD-10-CM | POA: Diagnosis not present

## 2022-04-22 DIAGNOSIS — M5137 Other intervertebral disc degeneration, lumbosacral region: Secondary | ICD-10-CM | POA: Diagnosis not present

## 2022-04-22 DIAGNOSIS — G894 Chronic pain syndrome: Secondary | ICD-10-CM | POA: Diagnosis not present

## 2022-04-22 DIAGNOSIS — M961 Postlaminectomy syndrome, not elsewhere classified: Secondary | ICD-10-CM | POA: Diagnosis not present

## 2022-04-27 ENCOUNTER — Ambulatory Visit: Payer: Medicare Other | Admitting: Pharmacist

## 2022-04-27 DIAGNOSIS — I10 Essential (primary) hypertension: Secondary | ICD-10-CM

## 2022-04-27 DIAGNOSIS — E1169 Type 2 diabetes mellitus with other specified complication: Secondary | ICD-10-CM

## 2022-04-27 DIAGNOSIS — I2510 Atherosclerotic heart disease of native coronary artery without angina pectoris: Secondary | ICD-10-CM

## 2022-04-27 DIAGNOSIS — M0609 Rheumatoid arthritis without rheumatoid factor, multiple sites: Secondary | ICD-10-CM

## 2022-04-27 DIAGNOSIS — E119 Type 2 diabetes mellitus without complications: Secondary | ICD-10-CM

## 2022-04-27 NOTE — Progress Notes (Signed)
Chronic Care Management Pharmacy Note  05/04/2022 Name:  Tina Berger MRN:  [id number] DOB:  09-05-1951  Summary: CCM F/U visit -Reviewed medications; pt stopped taking hydroxychloroquine due to itching/concern for allergic reaction, she has not informed rheumatologist -Reviewed risks of chronic prednisone use  Recommendations/Changes made from today's visit: -Advised to follow up with rheumatology for next options for RA  Plan: -[facility] will call patient 3 months for general update -Pharmacist follow up televisit scheduled for 6 months -PCP CPE 06/25/22    Subjective: Tina Berger is an 71 y.o. year old female who is a primary patient of Tower, [name], MD.  The CCM team was consulted for assistance with disease management and care coordination needs.    Engaged with patient by telephone for follow up visit in response to provider referral for pharmacy case management and/or care coordination services.   Consent to Services:  The patient was given information about Chronic Care Management services, agreed to services, and gave verbal consent prior to initiation of services.  Please see initial visit note for detailed documentation.   Patient Care Team: Tower, [name], MD as PCP - General [name], [name], MD as Consulting Physician (Cardiology) Charlton Haws, [facility] as Pharmacist (Pharmacist)  Recent office visits: 03/23/22 Dr Glori Bickers OV: f/u - acute cystitis. Rx'd Fosfomycin. D/C duloxetine.  Recent consult visits: 03/29/22 Dr Estanislado Pandy (Rheumatology) f/u RA. Rx'd hydroxychoroquine. D/C leflunomide (mood changes). Increase prednisone for 6 days.  03/26/22 Dr Chase Caller (Pulmonary): f/u ILD (mild). Continue supportive care. Repeat PFT 6 months.   02/03/22 PA Hazel Sams (Rheum): f/u RA. Steroid injection given (shoulder).  11/19/21 PA Hazel Sams (Rheum): f/u RA. Ordered TB test Hospital visits: 11/07/21 ED visit [facility]): headache. Trigger point injection  given.   Objective:  Lab Results  Component Value Date   CREATININE 0.95 03/04/2022   BUN 19 03/04/2022   GFR 48.18 (L) 11/13/2020   EGFR 64 03/04/2022   GFRNONAA >60 11/07/2021   GFRAA 75 03/24/2021   NA 140 03/04/2022   K 4.7 03/04/2022   CALCIUM 9.5 03/04/2022   CO2 25 03/04/2022   GLUCOSE 135 (H) 03/04/2022    Lab Results  Component Value Date/Time   HGBA1C 6.6 (H) 03/23/2022 11:57 AM   HGBA1C 6.5 08/20/2020 09:51 AM   GFR 48.18 (L) 11/13/2020 08:54 AM   GFR 63.03 09/25/2020 09:34 AM    Last diabetic Eye exam:  Lab Results  Component Value Date/Time   HMDIABEYEEXA No Retinopathy 03/04/2022 12:00 AM    Last diabetic Foot exam:  Lab Results  Component Value Date/Time   HMDIABFOOTEX Done 03/18/2010 12:00 AM     Lab Results  Component Value Date   CHOL 192 03/23/2022   HDL 64.60 03/23/2022   LDLCALC 88 03/23/2022   LDLDIRECT 149.0 07/26/2016   TRIG 197.0 (H) 03/23/2022   CHOLHDL 3 03/23/2022       Latest Ref Rng & Units 03/04/2022   11:26 AM 02/03/2022   11:19 AM 11/07/2021    5:44 AM  Hepatic Function  Total Protein 6.0 - 8.5 g/dL 7.6  7.6  8.1   Albumin 3.7 - 4.7 g/dL 4.6   4.3   AST 0 - 40 IU/L _0 ALT 0 - 32 IU/L _1 Alk Phosphatase 44 - 121 IU/L 67   60   Total Bilirubin 0.0 - 1.2 mg/dL 0.2  0.5  0.7  Lab Results  Component Value Date/Time   TSH 3.46 03/23/2022 11:57 AM   TSH 0.36 08/20/2020 09:51 AM   FREET4 1.1 01/24/2007 09:41 AM       Latest Ref Rng & Units 03/04/2022   11:26 AM 02/03/2022   11:19 AM 11/07/2021    5:44 AM  CBC  WBC 3.4 - 10.8 x10E3/uL 7.0  9.0  7.2   Hemoglobin 11.1 - 15.9 g/dL 14.5  14.2  14.2   Hematocrit 34.0 - 46.6 % 43.6  42.7  44.3   Platelets 150 - 450 x10E3/uL 289  339  327     Lab Results  Component Value Date/Time   VD25OH 48.52 08/20/2020 09:51 AM   VD25OH 50.96 08/17/2019 09:32 AM    Clinical ASCVD: Yes  The 10-year ASCVD risk score (Arnett DK, et al., 2019) is: 51.2%   Values  used to calculate the score:     Age: 71 years     Sex: Female     Is Non-Hispanic African American: No     Diabetic: Yes     Tobacco smoker: Yes     Systolic Blood Pressure: 761 mmHg     Is BP treated: Yes     HDL Cholesterol: 64.6 mg/dL     Total Cholesterol: 192 mg/dL       03/23/2022   11:25 AM 08/20/2020    3:33 PM 08/16/2019   10:31 AM  Depression screen PHQ 2/9  Decreased Interest 0 0 0  Down, Depressed, Hopeless 0 0 0  PHQ - 2 Score 0 0 0  Altered sleeping  0 0  Tired, decreased energy  0 0  Change in appetite  0 0  Feeling bad or failure about yourself   0 0  Trouble concentrating  0 0  Moving slowly or fidgety/restless  0 0  Suicidal thoughts  0 0  PHQ-9 Score  0 0  Difficult doing work/chores  Not difficult at all Not difficult at all     Social History   Tobacco Use  Smoking Status Former   Packs/day: 1.00   Years: 5.00   Total pack years: 5.00   Types: Cigarettes   Quit date: 10/18/1968   Years since quitting: 53.5   Passive exposure: Never  Smokeless Tobacco Never  Tobacco Comments   quit over 40 years   BP Readings from Last 3 Encounters:  03/29/22 (!) 153/86  03/26/22 126/80  03/23/22 128/82   Pulse Readings from Last 3 Encounters:  03/29/22 97  03/26/22 80  03/23/22 91   Wt Readings from Last 3 Encounters:  03/29/22 188 lb (85.3 kg)  03/26/22 188 lb 12.8 oz (85.6 kg)  03/23/22 187 lb 3.2 oz (84.9 kg)   BMI Readings from Last 3 Encounters:  03/29/22 34.95 kg/m  03/26/22 35.67 kg/m  03/23/22 35.37 kg/m    Assessment/Interventions: Review of patient past medical history, allergies, medications, health status, including review of consultants reports, laboratory and other test data, was performed as part of comprehensive evaluation and provision of chronic care management services.   SDOH:  (Social Determinants of Health) assessments and interventions performed: No  SDOH Screenings   Alcohol Screen: Low Risk  (08/20/2020)   Alcohol  Screen    Last Alcohol Screening Score (AUDIT): 0  Depression (PHQ2-9): Low Risk  (03/23/2022)   Depression (PHQ2-9)    PHQ-2 Score: 0  Financial Resource Strain: Low Risk  (09/28/2021)   Overall Financial Resource Strain (CARDIA)    Difficulty of  Paying Living Expenses: Not very hard  Food Insecurity: No Food Insecurity (08/20/2020)   Hunger Vital Sign    Worried About Running Out of Food in the Last Year: Never true    Ran Out of Food in the Last Year: Never true  Housing: Low Risk  (08/20/2020)   Housing    Last Housing Risk Score: 0  Physical Activity: Sufficiently Active (08/20/2020)   Exercise Vital Sign    Days of Exercise per Week: 5 days    Minutes of Exercise per Session: 60 min  Social Connections: Not on file  Stress: No Stress Concern Present (08/20/2020)   [place]    Feeling of Stress : Not at all  Tobacco Use: Medium Risk (03/29/2022)   Patient History    Smoking Tobacco Use: Former    Smokeless Tobacco Use: Never    Passive Exposure: Never  Transportation Needs: No Transportation Needs (08/20/2020)   PRAPARE - Hydrologist (Medical): No    Lack of Transportation (Non-Medical): No    CCM Care Plan  Allergies  Allergen Reactions   Ceftin [Cefuroxime] Swelling    Swelling, "legs turn blue".  Legs swelling, then blue, then a rash   Clonidine Derivatives     Swelling of lips   Erythromycin Swelling    Rash, swollen gums    Keflex [Cephalexin] Anaphylaxis    Chest was broken out in a rash. Lips were swelling. Took a few days to occur, but it kept getting worse.                                                                                                                                                                                                   Penicillins Anaphylaxis    Pt had a daughter with an allergy to this at a young age. Patient developed blisters anywhere  her child touched her. Also, if she used the bathroom after daughter did while on keflex, it would cause a reaction for her. Lips and roof of mouth swelled up also.   Sulfonamide Derivatives Swelling    Rash, swollen gums, lips   Ciprofloxacin     REACTION: ? rash vs sun rxn. Rash    Fluoxetine Hcl     REACTION: stomach problems. Severe pain in abdomen. Could not function d/t pain   Furosemide Swelling    REACTION: swelling worsened in legs once taking   Gabapentin Other (See Comments)    REACTION: edema of feet. Unable to get shoes on   Hydroxychloroquine Itching  Pt report itching felt like "pre-anaphylaxis" she has had with other medications   Paroxetine     REACTION: weight gain (30 pound weight gain   Pregabalin     REACTION: swelling of feet and legs. Unable to get shoes on.   Tetracycline     REACTION:inflammed genitals.    Venlafaxine     REACTION: sweating profusely   Etodolac     REACTION: reaction not known   Macrobid [Nitrofurantoin]     REACTION: ? Lung problem    Amitriptyline Hcl     REACTION: sedating   Atorvastatin     REACTION: muscle twitch and pain   Benicar [Olmesartan Medoxomil]     Muscle pain    Cephalexin Hives and Rash   Cetirizine Hcl     REACTION: headache   Diovan [Valsartan]     Thought it made her feel confused   Naproxen Sodium Other (See Comments)    REACTION: edema of feet and legs.  Not good for ckd    Medications Reviewed Today     Reviewed by Charlton Haws, [facility] (Pharmacist) on 05/04/22 at 1523  Med List Status: <None>   Medication Order Taking? Sig Documenting Provider Last Dose Status Informant  Acetaminophen (TYLENOL ARTHRITIS PAIN PO) [id number] Yes Take 650 mg by mouth daily. [provider] Taking Active   buPROPion (WELLBUTRIN XL) 150 MG 24 hr tablet [id number] Yes TAKE 1 TABLET BY MOUTH DAILY Tower, [name], MD Taking Active   Chlorphen-Phenyleph-ASA (ALKA-SELTZER PLUS COLD PO) [id number] Yes Take by mouth as  needed. Just for colds [provider] Taking Active Self  cholecalciferol (VITAMIN D3) 25 MCG (1000 UNIT) tablet [id number] Yes Take 1,000 Units by mouth daily. [provider] Taking Active Self  cyclobenzaprine (FLEXERIL) 5 MG tablet [id number] Yes Take 5 mg by mouth daily as needed. [provider] Taking Active   Diclofenac Sodium 1.5 % SOLN [id number] Yes Place 2 mLs onto the skin 4 (four) times daily. Tower, [name], MD Taking Active   esomeprazole (NEXIUM) 20 MG capsule [id number] Yes TAKE 1 CAPSULE BY MOUTH DAILY Tower, [name], MD Taking Active   famotidine (PEPCID) 40 MG tablet [id number] Yes TAKE 1 TABLET BY MOUTH DAILY Tower, [name], MD Taking Active   ferrous sulfate 325 (65 FE) MG EC tablet [id number] Yes Take 325 mg by mouth daily with breakfast. [provider] Taking Active   FLUAD QUADRIVALENT 0.5 ML injection [id number] Yes  [provider] Taking Active   glucose blood test strip [id number] Yes One Touch Ultra stripts blue-To check sugar once daily and as needed for DM2 250.00 Tower, [name], MD Taking Active   GUAIFENESIN CR PO [id number] Yes Take by mouth as needed. [provider] Taking Active   HYDROmorphone HCl (EXALGO) 12 MG T24A SR tablet [id number] Yes Take 12 mg by mouth 2 (two) times daily. [provider] Taking Active Self  Patient not taking:  Discontinued 05/04/22 1523 (Allergic reaction)   hyoscyamine (LEVSIN SL) 0.125 MG SL tablet [id number] Yes TAKE 1 TABLET BY MOUTH EVERY 4 HOURS AS NEEDED FOR CRAMPING. Tower, [name], MD Taking Active   ketoconazole (NIZORAL) 2 % shampoo [id number] Yes SHAMPOO WITH A SMALL AMOUNT AS DIRECTED THREE TIMES A WEEK SHAMPOO SCALP 3 DAYS A WEEK, LET SHAMPOO SIT FOR 10 MINUTES AND RINSE OFF Moye, [place], MD Taking Active   levothyroxine (SYNTHROID) 150 MCG tablet [id number] Yes Take 1 tablet (150 mcg total) by mouth daily. Tower,  [name], MD Taking Active   LYSINE PO [id number] Yes  Take by mouth as needed. OTC for ulcer prophylaxis [provider] Taking Active   Magnesium 400 MG CAPS [id number] Yes Take by mouth daily. [provider] Taking Active   metFORMIN (GLUCOPHAGE) 500 MG tablet [id number] Yes TAKE 1 TABLET BY MOUTH TWICE  DAILY WITH MEALS Tower, [name], MD Taking Active   metoprolol succinate (TOPROL-XL) 50 MG 24 hr tablet [id number]  TAKE 1 TABLET BY MOUTH TWICE  DAILY Tower, [name], MD  Active   naloxone [facility]) nasal spray 4 mg/0.1 mL [id number] Yes SMARTSIG:Both Nares [provider] Taking Active   ondansetron (ZOFRAN) 4 MG tablet [id number] Yes Take 1 tablet (4 mg total) by mouth every 8 (eight) hours as needed for nausea or vomiting. Ofilia Neas, PA-C Taking Active   oxyCODONE (OXYCONTIN) 10 MG 12 hr tablet [id number] Yes Take 10 mg by mouth 3 (three) times daily as needed. [place] pain [provider] Taking Active Self  predniSONE (DELTASONE) 1 MG tablet [id number] Yes TAKE 1 TABLET BY MOUTH ONCE WITH BREAKFAST , ALONG WITH 5MG TO EQUAL 6MG DAILY Deveshwar, Abel Presto, MD Taking Active   predniSONE (DELTASONE) 5 MG tablet [id number] Yes TAKE 1 TABLET BY MOUTH EVERY DAY Deveshwar, Shaili, MD Taking Active   ramipril (ALTACE) 5 MG capsule [id number] Yes TAKE 1 CAPSULE BY MOUTH DAILY Tower, [name], MD Taking Active   rosuvastatin (CRESTOR) 5 MG tablet [id number] Yes TAKE 1 TABLET BY MOUTH TWICE  WEEKLY Tower, [name], MD Taking Active   Tocilizumab (ACTEMRA ACTPEN) 162 MG/0.9ML SOAJ [id number] Yes Inject 162 mg into the skin every 14 (fourteen) days. Ofilia Neas, PA-C Taking Active             Patient Active Problem List   Diagnosis Date Noted   Dysuria 09/07/2021   Rheumatoid arthritis ([place]) 05/27/2021   Chronic venous insufficiency 05/23/2021   AVM (arteriovenous malformation) 04/04/2021   CAD (coronary artery disease) 01/22/2021   Primary osteoarthritis of both knees 01/12/2021   Positive ANA (antinuclear antibody)  11/17/2020   Joint pain 11/13/2020   Elevated serum creatinine 08/26/2020   Current use of proton pump inhibitor 08/26/2020   Estrogen deficiency 08/21/2018   TMJ (dislocation of temporomandibular joint), initial encounter 05/05/2018   Anterior neck pain 05/05/2018   Chronic pain syndrome 11/09/2017   Chronic upper extremity pain [facility] Area of Pain) (Bilateral) (L>R) 11/09/2017   Fibromyalgia syndrome 11/09/2017   Osteoarthritis 11/09/2017   Osteoarthritis of lumbar facet joint (Bilateral) 11/09/2017   Lumbar facet arthropathy (Bilateral) 11/09/2017   Lumbar facet syndrome (Bilateral) (L>R) 11/09/2017   Lumbar foraminal stenosis (multilevel) (Bilateral) 11/09/2017   DDD (degenerative disc disease), lumbar 11/09/2017   Thoracic levoscoliosis 11/09/2017   Thoracic facet syndrome (Bilateral) (L>R) 11/09/2017   Thoracic facet arthropathy (Bilateral) (R>L) 11/09/2017   DDD (degenerative disc disease), thoracic 11/09/2017   Thoracolumbar IVDD 11/09/2017   DDD (degenerative disc disease), cervical 11/09/2017   Osteoarthritis of cervical facet (Bilateral) 11/09/2017   Grade 1 Anterolisthesis of C7 over T1 11/09/2017   Cervical foraminal stenosis (Bilateral) 11/09/2017   History of fusion of cervical spine (C5-6 ACDF) 11/09/2017   Cervical facet syndrome (Bilateral) 11/09/2017   Disorder of skeletal system 11/09/2017   Cervical radiculitis (Bilateral) 11/09/2017   Lumbar Epidural lipomatosis 11/09/2017   Chronic sacroiliac joint pain (Bilateral) (L>R) 11/09/2017   Chronic hip pain (Bilateral) (L>R) 11/09/2017   Chronic upper back pain (Primary Area of  Pain) (midline) 10/24/2017   Chronic neck pain (Secondary Area of Pain) (Bilateral)  (L>R) 10/24/2017   Chronic low back pain (Fourth Area of Pain) (Bilateral) (L>R) 10/24/2017   Chronic lower extremity pain (Fifth Area of Pain) (Bilateral) (L>R) 10/24/2017   Lumbar Grade 1 Retrolisthesis of L1-2 and L2-3 10/24/2017   Other long term  (current) drug therapy 10/24/2017   Other specified health status 10/24/2017   Long term current use of opiate analgesic 10/24/2017   Long term prescription opiate use 10/24/2017   Opiate use 10/24/2017   DM type 2 (diabetes mellitus, type 2) ([place]) 06/14/2014   Pharmacologic therapy 06/14/2014   Pedal edema    Post-menopausal 06/23/2012   Lumbar disc disease with radiculopathy 11/18/2011   HTN (hypertension) 07/30/2011   Raynaud disease 07/30/2011   Obesity 07/30/2011   Routine general medical examination at a health care facility 03/28/2011   Palpitations 04/27/2010   UTI (urinary tract infection) 03/18/2010   Depression with anxiety 06/14/2008   Vitamin D deficiency 05/13/2008   ANXIETY 03/25/2008   Osteopenia 03/25/2008   Hypothyroidism 03/28/2007   Hyperlipidemia associated with type 2 diabetes mellitus ([place]) 03/28/2007   Other iron deficiency anemias 03/28/2007   PANIC ATTACK 03/28/2007   KERATOCONJUNCTIVITIS SICCA 03/28/2007   Mitral valve disorder 03/28/2007   ABNORMAL HEART RHYTHMS 03/28/2007   Raynaud's syndrome 03/28/2007   GERD 03/28/2007   IBS 03/28/2007   Rosacea 03/28/2007   PLANTAR FASCIITIS 03/28/2007   MIGRAINES, HX OF 03/28/2007    Immunization History  Administered Date(s) Administered   Fluad Quad(high Dose 65+) 08/07/2020   Influenza Split 06/19/2011   Influenza Whole 08/01/2009   Influenza, High Dose Seasonal PF 08/14/2018, 07/03/2019   Influenza,inj,Quad PF,6+ Mos 06/14/2014, 08/25/2015, 07/26/2016, 08/19/2017   Moderna Sars-Covid-2 Vaccination 09/17/2020   PFIZER(Purple Top)SARS-COV-2 Vaccination 11/27/2019, 12/18/2019   Pneumococcal Conjugate-13 08/25/2015   Pneumococcal Polysaccharide-23 08/25/2016   Td 11/25/2003   Tdap 06/14/2014   Zoster, Live 09/16/2014    Conditions to be addressed/monitored:  Hypertension, Hyperlipidemia, Diabetes, Coronary Artery Disease, Hypothyroidism, Depression, Anxiety, Osteopenia, and Rheumatoid arthritis,  Chronic pain  Care Plan : [place]  Updates made by Charlton Haws, [place] since 05/04/2022 12:00 AM     Problem: Hypertension, Hyperlipidemia, Diabetes, Coronary Artery Disease, Hypothyroidism, Depression, Anxiety, Osteopenia, and Rheumatoid arthritis, Chronic pain   Priority: High     Long-Range Goal: Disease Management   Start Date: 09/28/2021  Expected End Date: 05/05/2023  This Visit's Progress: On track  Priority: High  Note:   Current Barriers:  Does not contact provider office for questions/concerns  Pharmacist Clinical Goal(s):  Patient will contact provider office for questions/concerns as evidenced notation of same in electronic health record through collaboration with PharmD and provider.   Interventions: 1:1 collaboration with Tower, [name], MD regarding development and update of comprehensive plan of care as evidenced by provider attestation and co-signature Inter-disciplinary care team collaboration (see longitudinal plan of care) Comprehensive medication review performed; medication list updated in electronic medical record  Hypertension (BP goal <140/90) -Controlled - per home readings -Current home BP readings: 120-135/70s (husband checks occasionally) -History of palpitations, CAD, bilateral edema followed by cardiology  -Current treatment: Metoprolol succinate 50 mg BID - Appropriate, Effective, Safe, Accessible Ramipril 5 mg daily -Appropriate, Effective, Safe, Accessible -Medications previously tried: spironolactone, olmesartan, HCTZ, diltiazem, clonidine, torsemide -Educated on BP goals and benefits of medications for prevention of heart attack, stroke and kidney damage; -Counseled to monitor BP at home weekly -Recommended to  continue current medication  Hyperlipidemia / CAD (LDL goal < 70) -Not ideally controlled - LDL 88 (03/2022) above goal; peak 163 in 2010.  -Pt reports prior intolerance to multiple statins. Rosuvastatin 5 mg twice  weekly is the first one she has tolerated -Hx aortic atherosclerosis, CAD.  -Current treatment: Rosuvastatin 5 mg twice a week - Appropriate, Effective, Safe, Accessible -Medications previously tried: atorvastatin -Educated on Cholesterol goals;  -Recommended to continue current medication  Diabetes (A1c goal <7%) -Controlled - A1c 6.6% (03/2022) at goal -Current home glucose readings - she inquired about CGM, discussed insurance coverage requirements/OOP cost. She states it would be unaffordable.  -Denies hypoglycemic/hyperglycemic symptoms -Current medications: Metformin 500 mg BID - Appropriate, Effective, Safe, Accessible Testing supplies -Medications previously tried: none  -Educated on A1c and blood sugar goals; -Recommended to continue current medication  Depression/Anxiety (Goal: Improve mood) -Controlled - per patient report; She does not see a counselor currently -PHQ9: 0 (03/2022) - minimal depression -GAD7: reports ongoing anxiety, situational stress, no GAD completed -Current treatment: Bupropion 150 mg 24 hr daily (PM) -Appropriate, Effective, Safe, Accessible -Medications previously tried/failed: fluoxetine (GI pain), paroxetine (wt gain), venlafaxine (sweating), amitriptyline (sedation), duloxetine -Educated on Benefits of medication for symptom control;Benefits of cognitive-behavioral therapy with or without medication -Recommended to continue current medication  Rheumatoid Arthritis (Goal: Improve symptoms) -Not ideally controlled - pt stopped taking hydroxychloroquine due to side effects/concern for allergic reaction -Pt reports she will be able to transition off prednisone soon. -Follows with rheumatology and pain mgmt -Current treatment  Prednisone 6 mg daily -Appropriate, Effective, Query Safe Actemra (Tocilizumab) 162 mg q14 days - Appropriate, Effective, Safe, Accessible Hydroxychloroquine 200 mg BID M-F (new 03/2022) - stopped after 2 doses due to itching,  felt like pre-anaphylaxis -Medications previously tried: leflunomide (mood changes) -Reviewed long term risks of chronic prednisone use; advised to follow up with rheumatology for next options -Recommended to continue current medication  Chronic pain (Goal: manage pain) -Controlled -Follows with pain mgmt (NP Arrie [facility]). Hx RA, DDD (lumbar, cervical, thoracic), osteoarthritis. -Current treatment  Hydromorphone 12 mg BID -Appropriate, Effective, Safe, Accessible Oxycodone 10 mg TID prn -Appropriate, Effective, Safe, Accessible Tylenol 650 mg PRN -Appropriate, Effective, Safe, Accessible Diclofenac 1.5% solution -Appropriate, Effective, Safe, Accessible Cyclobenzaprine 5 mg PRN -Appropriate, Effective, Safe, Accessible Naloxone nasal spray PRN -Medications previously tried: pregabalin, gabapentin, venlfaxine, naproxen, etodolac, amitriptyline, duloxetine -Recommended to continue current medication  Osteopenia (Goal Prevent fractures) -Controlled -Last DEXA Scan: 10/2018   T-Score total hip: -1.5  T-Score lumbar spine: +0.4  10-year probability of major osteoporotic fracture: 14.4%  10-year probability of hip fracture: 1.8% -Patient is not a candidate for pharmacologic treatment -Current treatment  Vitamin D 1000 IU daily -Appropriate, Effective, Safe, Accessible -Medications previously tried: n/a  -Recommend 1200 mg of calcium daily from dietary and supplemental sources. -Recommended to continue current medication  Hypothyroidism (Goal: Improve symptoms, TSH T4 WNL) -Controlled - TSH at goal -Current treatment  Levothyroxine 150 mcg daily -Appropriate, Effective, Safe, Accessible -Medications previously tried: none  -Recommended to continue current medication  GERD (Goal: Improve symptoms) -Controlled, pt reports severe symptoms with missed dose of esomeprazole. She takes it daily in morning and H2 at night. -Current treatment  Esomeprazole 20 mg daily -Appropriate, Effective,  Safe, Accessible Famotidine 40 mg HS -Appropriate, Effective, Safe, Accessible -Medications previously tried: none  -Recommended to continue current medication  OTC/Other Ferrous sulfate 325 mg - 1 tablet daily with vitamin C Hyoscyamine 0.125 mg SL - 1 PRN cramping Ondansetron  4 mg - 1 q8h PRN n/v (rare use) Lysine Magnesium 400 mg Mucinex [place] Maintenance -Vaccine gaps: none -Mammogram, Cologuard due  Patient Goals/Self-Care Activities Patient will:  - take medications as prescribed as evidenced by patient report and record review focus on medication adherence by routine       Medication Assistance: None required.  Patient affirms current coverage meets needs.  Compliance/Adherence/Medication fill history: Care Gaps: Mammogram (due 04/05/19) Cologuard (due 09/04/19)  Star-Rating Drugs: Rosuvastatin - PDC 84% Ramipril - PDC 86% Metformin - PDC 59% (LF 10/12/21 x 90 ds [employer])  Medication Access: Within the past 30 days, how often has patient missed a dose of medication? 0 Is a pillbox or other method used to improve adherence? Yes  Factors that may affect medication adherence? adverse effects of medications Are meds synced by current pharmacy? No  Are meds delivered by current pharmacy? Yes  Does patient experience delays in picking up medications due to transportation concerns? No   [facility] Reviewed: Is patient disadvantaged to use [facility]?: Yes  Current Rx insurance plan: [facility] [place] Name and location of Current pharmacy:  [facility] ([facility] ) - [place], [place] [address] Phone: [phone] Fax: [phone]  [facility] services reviewed with patient today?: No  Patient requests to transfer care to [facility]?: No  Reason patient declined to change pharmacies: Disadvantaged due to insurance/mail order   Care Plan and Follow Up  Patient Decision:  Patient agrees to Care Plan and Follow-up.  Plan: Telephone follow up appointment with care management team member scheduled for:  6 months  Charlene Brooke, PharmD, BCACP Clinical Pharmacist [facility] at [facility] [phone]

## 2022-04-30 ENCOUNTER — Encounter: Payer: Self-pay | Admitting: Family Medicine

## 2022-04-30 NOTE — Telephone Encounter (Signed)
Please have her drop off the form I can't find the diabetic shoe paperwork under media but suspect I did it?  May need another copy sent to [place] ? Perhaps is still being scanned?

## 2022-05-03 ENCOUNTER — Telehealth: Payer: Self-pay | Admitting: Family Medicine

## 2022-05-03 NOTE — Telephone Encounter (Signed)
Patient came in and dropped off a form for Dr Glori Bickers to fill out. I placed envelope in Dr Marliss Coots folder. Would like a call when its complete.

## 2022-05-04 ENCOUNTER — Other Ambulatory Visit: Payer: Self-pay | Admitting: Family Medicine

## 2022-05-04 NOTE — Patient Instructions (Signed)
Visit Information  Phone number for Pharmacist: [phone]   Goals Addressed   None     Care Plan : [place]  Updates made by Charlton Haws, RPH since 05/04/2022 12:00 AM     Problem: Hypertension, Hyperlipidemia, Diabetes, Coronary Artery Disease, Hypothyroidism, Depression, Anxiety, Osteopenia, and Rheumatoid arthritis, Chronic pain   Priority: High     Long-Range Goal: Disease Management   Start Date: 09/28/2021  Expected End Date: 05/05/2023  This Visit's Progress: On track  Priority: High  Note:   Current Barriers:  Does not contact provider office for questions/concerns  Pharmacist Clinical Goal(s):  Patient will contact provider office for questions/concerns as evidenced notation of same in electronic health record through collaboration with PharmD and provider.   Interventions: 1:1 collaboration with Tower, [name], MD regarding development and update of comprehensive plan of care as evidenced by provider attestation and co-signature Inter-disciplinary care team collaboration (see longitudinal plan of care) Comprehensive medication review performed; medication list updated in electronic medical record  Hypertension (BP goal <140/90) -Controlled - per home readings -Current home BP readings: 120-135/70s (husband checks occasionally) -History of palpitations, CAD, bilateral edema followed by cardiology  -Current treatment: Metoprolol succinate 50 mg BID - Appropriate, Effective, Safe, Accessible Ramipril 5 mg daily -Appropriate, Effective, Safe, Accessible -Medications previously tried: spironolactone, olmesartan, HCTZ, diltiazem, clonidine, torsemide -Educated on BP goals and benefits of medications for prevention of heart attack, stroke and kidney damage; -Counseled to monitor BP at home weekly -Recommended to continue current medication  Hyperlipidemia / CAD (LDL goal < 70) -Not ideally controlled - LDL 88 (03/2022) above goal; peak 163 in  2010.  -Pt reports prior intolerance to multiple statins. Rosuvastatin 5 mg twice weekly is the first one she has tolerated -Hx aortic atherosclerosis, CAD.  -Current treatment: Rosuvastatin 5 mg twice a week - Appropriate, Effective, Safe, Accessible -Medications previously tried: atorvastatin -Educated on Cholesterol goals;  -Recommended to continue current medication  Diabetes (A1c goal <7%) -Controlled - A1c 6.6% (03/2022) at goal -Current home glucose readings - she inquired about CGM, discussed insurance coverage requirements/OOP cost. She states it would be unaffordable.  -Denies hypoglycemic/hyperglycemic symptoms -Current medications: Metformin 500 mg BID - Appropriate, Effective, Safe, Accessible Testing supplies -Medications previously tried: none  -Educated on A1c and blood sugar goals; -Recommended to continue current medication  Depression/Anxiety (Goal: Improve mood) -Controlled - per patient report; She does not see a counselor currently -PHQ9: 0 (03/2022) - minimal depression -GAD7: reports ongoing anxiety, situational stress, no GAD completed -Current treatment: Bupropion 150 mg 24 hr daily (PM) -Appropriate, Effective, Safe, Accessible -Medications previously tried/failed: fluoxetine (GI pain), paroxetine (wt gain), venlafaxine (sweating), amitriptyline (sedation), duloxetine -Educated on Benefits of medication for symptom control;Benefits of cognitive-behavioral therapy with or without medication -Recommended to continue current medication  Rheumatoid Arthritis (Goal: Improve symptoms) -Not ideally controlled - pt stopped taking hydroxychloroquine due to side effects/concern for allergic reaction -Pt reports she will be able to transition off prednisone soon. -Follows with rheumatology and pain mgmt -Current treatment  Prednisone 6 mg daily -Appropriate, Effective, Query Safe Actemra (Tocilizumab) 162 mg q14 days - Appropriate, Effective, Safe,  Accessible Hydroxychloroquine 200 mg BID M-F (new 03/2022) - stopped after 2 doses due to itching, felt like pre-anaphylaxis -Medications previously tried: leflunomide (mood changes) -Reviewed long term risks of chronic prednisone use; advised to follow up with rheumatology for next options -Recommended to continue current medication  Chronic pain (Goal: manage pain) -Controlled -Follows with pain mgmt (NP  Arrie Eastern). Hx RA, DDD (lumbar, cervical, thoracic), osteoarthritis. -Current treatment  Hydromorphone 12 mg BID -Appropriate, Effective, Safe, Accessible Oxycodone 10 mg TID prn -Appropriate, Effective, Safe, Accessible Tylenol 650 mg PRN -Appropriate, Effective, Safe, Accessible Diclofenac 1.5% solution -Appropriate, Effective, Safe, Accessible Cyclobenzaprine 5 mg PRN -Appropriate, Effective, Safe, Accessible Naloxone nasal spray PRN -Medications previously tried: pregabalin, gabapentin, venlfaxine, naproxen, etodolac, amitriptyline, duloxetine -Recommended to continue current medication  Osteopenia (Goal Prevent fractures) -Controlled -Last DEXA Scan: 10/2018   T-Score total hip: -1.5  T-Score lumbar spine: +0.4  10-year probability of major osteoporotic fracture: 14.4%  10-year probability of hip fracture: 1.8% -Patient is not a candidate for pharmacologic treatment -Current treatment  Vitamin D 1000 IU daily -Appropriate, Effective, Safe, Accessible -Medications previously tried: n/a  -Recommend 1200 mg of calcium daily from dietary and supplemental sources. -Recommended to continue current medication  Hypothyroidism (Goal: Improve symptoms, TSH T4 WNL) -Controlled - TSH at goal -Current treatment  Levothyroxine 150 mcg daily -Appropriate, Effective, Safe, Accessible -Medications previously tried: none  -Recommended to continue current medication  GERD (Goal: Improve symptoms) -Controlled, pt reports severe symptoms with missed dose of esomeprazole. She takes it daily  in morning and H2 at night. -Current treatment  Esomeprazole 20 mg daily -Appropriate, Effective, Safe, Accessible Famotidine 40 mg HS -Appropriate, Effective, Safe, Accessible -Medications previously tried: none  -Recommended to continue current medication  OTC/Other Ferrous sulfate 325 mg - 1 tablet daily with vitamin C Hyoscyamine 0.125 mg SL - 1 PRN cramping Ondansetron 4 mg - 1 q8h PRN n/v (rare use) Lysine Magnesium 400 mg Mucinex [place] Maintenance -Vaccine gaps: none -Mammogram, Cologuard due  Patient Goals/Self-Care Activities Patient will:  - take medications as prescribed as evidenced by patient report and record review focus on medication adherence by routine       Patient verbalizes understanding of instructions and care plan provided today and agrees to view in MyChart. Active MyChart status and patient understanding of how to access instructions and care plan via MyChart confirmed with patient.    Telephone follow up appointment with pharmacy team member scheduled for: 6 months  Charlene Brooke, PharmD, [facility] Clinical Pharmacist [facility] at [facility] [phone]

## 2022-05-05 ENCOUNTER — Ambulatory Visit (INDEPENDENT_AMBULATORY_CARE_PROVIDER_SITE_OTHER): Payer: Medicare Other

## 2022-05-05 VITALS — Wt 188.0 lb

## 2022-05-05 DIAGNOSIS — Z0000 Encounter for general adult medical examination without abnormal findings: Secondary | ICD-10-CM

## 2022-05-05 DIAGNOSIS — Z1231 Encounter for screening mammogram for malignant neoplasm of breast: Secondary | ICD-10-CM

## 2022-05-05 NOTE — Patient Instructions (Signed)
[name] , Thank you for taking time to come for your Medicare Wellness Visit. I appreciate your ongoing commitment to your health goals. Please review the following plan we discussed and let me know if I can assist you in the future.   Screening recommendations/referrals: Colonoscopy: declined referral Mammogram: referral sent Bone Density: 11/01/18 Recommended yearly ophthalmology/optometry visit for glaucoma screening and checkup Recommended yearly dental visit for hygiene and checkup  Vaccinations: Influenza vaccine: n/c Pneumococcal vaccine: 08/25/16 Tdap vaccine: 06/14/14 Shingles vaccine: Zostavax 09/16/14- needs to complete Shingrix series   Covid-19:11/27/19, 12/18/19, 09/17/20  Advanced directives: yes  Conditions/risks identified: no  Next appointment: Follow up in one year for your annual wellness visit 05/09/23 @ 2pm by phone   Preventive Care 65 Years and Older, Female Preventive care refers to lifestyle choices and visits with your health care provider that can promote health and wellness. What does preventive care include? A yearly physical exam. This is also called an annual well check. Dental exams once or twice a year. Routine eye exams. Ask your health care provider how often you should have your eyes checked. Personal lifestyle choices, including: Daily care of your teeth and gums. Regular physical activity. Eating a healthy diet. Avoiding tobacco and drug use. Limiting alcohol use. Practicing safe sex. Taking low-dose aspirin every day. Taking vitamin and mineral supplements as recommended by your health care provider. What happens during an annual well check? The services and screenings done by your health care provider during your annual well check will depend on your age, overall health, lifestyle risk factors, and family history of disease. Counseling  Your health care provider may ask you questions about your: Alcohol use. Tobacco use. Drug  use. Emotional well-being. Home and relationship well-being. Sexual activity. Eating habits. History of falls. Memory and ability to understand (cognition). Work and work Statistician. Reproductive health. Screening  You may have the following tests or measurements: Height, weight, and BMI. Blood pressure. Lipid and cholesterol levels. These may be checked every 5 years, or more frequently if you are over 81 years old. Skin check. Lung cancer screening. You may have this screening every year starting at age 23 if you have a 30-pack-year history of smoking and currently smoke or have quit within the past 15 years. Fecal occult blood test (FOBT) of the stool. You may have this test every year starting at age 46. Flexible sigmoidoscopy or colonoscopy. You may have a sigmoidoscopy every 5 years or a colonoscopy every 10 years starting at age 20. Hepatitis C blood test. Hepatitis B blood test. Sexually transmitted disease (STD) testing. Diabetes screening. This is done by checking your blood sugar (glucose) after you have not eaten for a while (fasting). You may have this done every 1-3 years. Bone density scan. This is done to screen for osteoporosis. You may have this done starting at age 61. Mammogram. This may be done every 1-2 years. Talk to your health care provider about how often you should have regular mammograms. Talk with your health care provider about your test results, treatment options, and if necessary, the need for more tests. Vaccines  Your health care provider may recommend certain vaccines, such as: Influenza vaccine. This is recommended every year. Tetanus, diphtheria, and acellular pertussis (Tdap, Td) vaccine. You may need a Td booster every 10 years. Zoster vaccine. You may need this after age 1. Pneumococcal 13-valent conjugate (PCV13) vaccine. One dose is recommended after age 4. Pneumococcal polysaccharide (PPSV23) vaccine. One dose is recommended after  age  47. Talk to your health care provider about which screenings and vaccines you need and how often you need them. This information is not intended to replace advice given to you by your health care provider. Make sure you discuss any questions you have with your health care provider. Document Released: 10/31/2015 Document Revised: 06/23/2016 Document Reviewed: 08/05/2015 Elsevier Interactive Patient Education  2017 [employer] Prevention in the Home Falls can cause injuries. They can happen to people of all ages. There are many things you can do to make your home safe and to help prevent falls. What can I do on the outside of my home? Regularly fix the edges of walkways and driveways and fix any cracks. Remove anything that might make you trip as you walk through a door, such as a raised step or threshold. Trim any bushes or trees on the path to your home. Use bright outdoor lighting. Clear any walking paths of anything that might make someone trip, such as rocks or tools. Regularly check to see if handrails are loose or broken. Make sure that both sides of any steps have handrails. Any raised decks and porches should have guardrails on the edges. Have any leaves, snow, or ice cleared regularly. Use sand or salt on walking paths during winter. Clean up any spills in your garage right away. This includes oil or grease spills. What can I do in the bathroom? Use night lights. Install grab bars by the toilet and in the tub and shower. Do not use towel bars as grab bars. Use non-skid mats or decals in the tub or shower. If you need to sit down in the shower, use a plastic, non-slip stool. Keep the floor dry. Clean up any water that spills on the floor as soon as it happens. Remove soap buildup in the tub or shower regularly. Attach bath mats securely with double-sided non-slip rug tape. Do not have throw rugs and other things on the floor that can make you trip. What can I do in the  bedroom? Use night lights. Make sure that you have a light by your bed that is easy to reach. Do not use any sheets or blankets that are too big for your bed. They should not hang down onto the floor. Have a firm chair that has side arms. You can use this for support while you get dressed. Do not have throw rugs and other things on the floor that can make you trip. What can I do in the kitchen? Clean up any spills right away. Avoid walking on wet floors. Keep items that you use a lot in easy-to-reach places. If you need to reach something above you, use a strong step stool that has a grab bar. Keep electrical cords out of the way. Do not use floor polish or wax that makes floors slippery. If you must use wax, use non-skid floor wax. Do not have throw rugs and other things on the floor that can make you trip. What can I do with my stairs? Do not leave any items on the stairs. Make sure that there are handrails on both sides of the stairs and use them. Fix handrails that are broken or loose. Make sure that handrails are as long as the stairways. Check any carpeting to make sure that it is firmly attached to the stairs. Fix any carpet that is loose or worn. Avoid having throw rugs at the top or bottom of the stairs. If you do  have throw rugs, attach them to the floor with carpet tape. Make sure that you have a light switch at the top of the stairs and the bottom of the stairs. If you do not have them, ask someone to add them for you. What else can I do to help prevent falls? Wear shoes that: Do not have high heels. Have rubber bottoms. Are comfortable and fit you well. Are closed at the toe. Do not wear sandals. If you use a stepladder: Make sure that it is fully opened. Do not climb a closed stepladder. Make sure that both sides of the stepladder are locked into place. Ask someone to hold it for you, if possible. Clearly mark and make sure that you can see: Any grab bars or  handrails. First and last steps. Where the edge of each step is. Use tools that help you move around (mobility aids) if they are needed. These include: Canes. Walkers. Scooters. Crutches. Turn on the lights when you go into a dark area. Replace any light bulbs as soon as they burn out. Set up your furniture so you have a clear path. Avoid moving your furniture around. If any of your floors are uneven, fix them. If there are any pets around you, be aware of where they are. Review your medicines with your doctor. Some medicines can make you feel dizzy. This can increase your chance of falling. Ask your doctor what other things that you can do to help prevent falls. This information is not intended to replace advice given to you by your health care provider. Make sure you discuss any questions you have with your health care provider. Document Released: 07/31/2009 Document Revised: 03/11/2016 Document Reviewed: 11/08/2014 Elsevier Interactive Patient Education  2017 [employer].

## 2022-05-05 NOTE — Progress Notes (Signed)
Virtual Visit via Telephone Note  I connected with  Tina Berger on 05/05/22 at  3:00 PM EDT by telephone and verified that I am speaking with the correct person using two identifiers.  Location: Patient: home Provider: [place] Persons participating in the virtual visit: [place]   I discussed the limitations, risks, security and privacy concerns of performing an evaluation and management service by telephone and the availability of in person appointments. The patient expressed understanding and agreed to proceed.  Interactive audio and video telecommunications were attempted between this nurse and patient, however failed, due to patient having technical difficulties OR patient did not have access to video capability.  We continued and completed visit with audio only.  Some vital signs may be absent or patient reported.   Dionisio David, LPN  Subjective:   Tina Berger is a 71 y.o. female who presents for Medicare Annual (Subsequent) preventive examination.  Review of Systems     Cardiac Risk Factors include: advanced age (>58mn, >>25women);diabetes mellitus     Objective:    There were no vitals filed for this visit. There is no height or weight on file to calculate BMI.     05/05/2022    3:11 PM 04/08/2021    3:40 PM 04/06/2021    4:07 PM 03/29/2021    7:05 PM 08/20/2020    3:31 PM 04/14/2020   10:59 AM 08/16/2019   10:30 AM  Advanced Directives  Does Patient Have a Medical Advance Directive? Yes Yes Yes No Yes No Yes  Type of [place]of [place]  Living will;Healthcare Power of [place]  [place]  Does patient want to make changes to medical advance directive? Yes (Inpatient - patient defers changing a medical advance directive and declines information at this time)  No - Patient declined      Copy of [place] in Chart? Yes - validated most recent copy scanned in chart (See row information) No - copy requested Yes - validated most recent copy scanned in chart (See row information)  Yes - validated most recent copy scanned in chart (See row information)  No - copy requested  Would patient like information on creating a medical advance directive? No - Patient declined  No - Patient declined No - Patient declined  No - Patient declined     Current Medications (verified) Outpatient Encounter Medications as of 05/05/2022  Medication Sig   Acetaminophen (TYLENOL ARTHRITIS PAIN PO) Take 650 mg by mouth daily.   buPROPion (WELLBUTRIN XL) 150 MG 24 hr tablet TAKE 1 TABLET BY MOUTH DAILY   Chlorphen-Phenyleph-ASA (ALKA-SELTZER PLUS COLD PO) Take by mouth as needed. Just for colds   cholecalciferol (VITAMIN D3) 25 MCG (1000 UNIT) tablet Take 1,000 Units by mouth daily.   cyclobenzaprine (FLEXERIL) 5 MG tablet Take 5 mg by mouth daily as needed.   Diclofenac Sodium 1.5 % SOLN Place 2 mLs onto the skin 4 (four) times daily.   esomeprazole (NEXIUM) 20 MG capsule TAKE 1 CAPSULE BY MOUTH DAILY   famotidine (PEPCID) 40 MG tablet TAKE 1 TABLET BY MOUTH DAILY   ferrous sulfate 325 (65 FE) MG EC tablet Take 325 mg by mouth daily with breakfast.   FLUAD QUADRIVALENT 0.5 ML injection    glucose blood test strip One Touch Ultra stripts blue-To check sugar once daily and as needed for DM2 250.00  GUAIFENESIN CR PO Take by mouth as needed.   HYDROmorphone HCl (EXALGO) 12 MG T24A SR tablet Take 12 mg by mouth 2 (two) times daily.   hyoscyamine (LEVSIN SL) 0.125 MG SL tablet TAKE 1 TABLET BY MOUTH EVERY 4 HOURS AS NEEDED FOR CRAMPING.   ketoconazole (NIZORAL) 2 % shampoo SHAMPOO WITH A SMALL AMOUNT AS DIRECTED THREE TIMES A WEEK SHAMPOO SCALP 3 DAYS A WEEK, LET SHAMPOO SIT FOR 10 MINUTES AND RINSE OFF   levothyroxine (SYNTHROID) 150 MCG tablet Take 1 tablet (150 mcg total) by mouth daily.   LYSINE PO Take by mouth as needed.  OTC for ulcer prophylaxis   Magnesium 400 MG CAPS Take by mouth daily.   metFORMIN (GLUCOPHAGE) 500 MG tablet TAKE 1 TABLET BY MOUTH TWICE  DAILY WITH MEALS   metoprolol succinate (TOPROL-XL) 50 MG 24 hr tablet TAKE 1 TABLET BY MOUTH TWICE  DAILY   naloxone (NARCAN) nasal spray 4 mg/0.1 mL SMARTSIG:Both Nares   ondansetron (ZOFRAN) 4 MG tablet Take 1 tablet (4 mg total) by mouth every 8 (eight) hours as needed for nausea or vomiting.   oxyCODONE (OXYCONTIN) 10 MG 12 hr tablet Take 10 mg by mouth 3 (three) times daily as needed. Beakthrough pain   predniSONE (DELTASONE) 1 MG tablet TAKE 1 TABLET BY MOUTH ONCE WITH BREAKFAST , ALONG WITH '5MG'$  TO EQUAL '6MG'$  DAILY   predniSONE (DELTASONE) 5 MG tablet TAKE 1 TABLET BY MOUTH EVERY DAY   ramipril (ALTACE) 5 MG capsule TAKE 1 CAPSULE BY MOUTH DAILY   rosuvastatin (CRESTOR) 5 MG tablet TAKE 1 TABLET BY MOUTH TWICE  WEEKLY   Tocilizumab (ACTEMRA ACTPEN) 162 MG/0.9ML SOAJ Inject 162 mg into the skin every 14 (fourteen) days.   No facility-administered encounter medications on file as of 05/05/2022.    Allergies (verified) Ceftin [cefuroxime], Clonidine derivatives, Erythromycin, Keflex [cephalexin], Penicillins, Sulfonamide derivatives, Ciprofloxacin, Fluoxetine hcl, Furosemide, Gabapentin, Hydroxychloroquine, Paroxetine, Pregabalin, Tetracycline, Venlafaxine, Etodolac, Macrobid [nitrofurantoin], Amitriptyline hcl, Atorvastatin, Benicar [olmesartan medoxomil], Cephalexin, Cetirizine hcl, Diovan [valsartan], and Naproxen sodium   History: Past Medical History:  Diagnosis Date   Acute renal insufficiency 05/31/2014   pt not aware of this diagnosis   Anemia, iron deficiency    Anxiety    Bilateral lower extremity edema    a. uses torsemide   Cervical dysplasia    abnormal paps   Colon cancer screening 06/14/2014   Coronary artery disease    Cough 08/08/2012   Degenerative disk disease 11/19/2011   Depression    Diabetes mellitus type II     Diverticulosis    DJD (degenerative joint disease)    Drug rash 05/22/2011   Dry eyes    Dysrhythmia    metoprolol.   Edema    Elevated liver enzymes 03/21/2012   Encounter for routine gynecological examination 06/14/2014   ESOPHAGITIS 03/28/2007   Qualifier: Hospitalized for  By: Marcelino Scot CMA, [place] Bilberry     Fibromyalgia    GASTRITIS 03/28/2007   Qualifier: History of  By: Marcelino Scot CMA, [place] Bilberry     GERD (gastroesophageal reflux disease)    Hemorrhoids    external   HLD (hyperlipidemia)    HNP (herniated nucleus pulposus) 11/1997   T6,7,8 with DJD   HTN (hypertension)    Hyperglycemia 05/13/2008   Qualifier: Diagnosis of  By: Glori Bickers MD, Carmell Austria    Hypothyroidism    Interstitial lung disease ([place])    pt not aware of this   Left ovarian cyst  x 3, rupture   Osteoarthritis    hands   Osteopenia    mild-11/01; improved 12/05   Other screening mammogram 08/18/2011   Palpitations    PERSONAL HISTORY ALLERGY UNSPEC MEDICINAL AGENT 03/18/2010   Qualifier: Diagnosis of  By: Glori Bickers MD, Carmell Austria    PONV (postoperative nausea and vomiting)    Raynaud's disease    Recurrent HSV (herpes simplex virus)    lesions in nose or mouth with frequent ulcers. d/t meds and dry mouth   Rheumatoid arthritis ([employer])    Rhinitis 03/21/2012   Routine general medical examination at a health care facility 03/28/2011   Past Surgical History:  Procedure Laterality Date   ABDOMINAL EXPLORATION SURGERY     ACDF  1991   BIOPSY OF SKIN SUBCUTANEOUS TISSUE AND/OR MUCOUS MEMBRANE Left 04/08/2021   Procedure: BIOPSY OF SKIN SUBCUTANEOUS TISSUE AND/OR MUCOUS MEMBRANE ( REMOVAL SKIN NODULE);  Surgeon: Katha Cabal, MD;  Location: [facility] ORS;  Service: Vascular;  Laterality: Left;   COLONOSCOPY  08/2000   Diverticulosis; hemorrhoids   HAND SURGERY Left    left thumb. PIN REMOVED   LASIK     bilateral   Family History  Problem Relation Age of Onset   Lung cancer Mother        + smoker    Coronary artery disease Mother        relatively young   Emphysema Father        + smoker   Lymphoma Sister    Heart disease Brother    Lymphoma Brother    Parkinson's disease Brother    Anemia Brother        aplastic    Diabetes Brother    Iron deficiency Daughter    Leukemia Son    Breast cancer Neg Hx    Social History   Socioeconomic History   Marital status: Married    Spouse name: Tina Berger   Number of children: 1   Years of education: Not on file   Highest education level: Not on file  Occupational History   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 [employer]    Employer: [place]: retired  Tobacco Use   Smoking status: Former    Packs/day: 1.00    Years: 5.00    Total pack years: 5.00    Types: Cigarettes    Quit date: 10/18/1968    Years since quitting: 53.5    Passive exposure: Never   Smokeless tobacco: Never   Tobacco comments:    quit over 40 years  Vaping Use   Vaping Use: Never used  Substance and Sexual Activity   Alcohol use: No    Alcohol/week: 0.0 standard drinks of alcohol   Drug use: No   Sexual activity: Yes  Other Topics Concern   Not on file  Social History Narrative   Married1 childSecretary to dean at [employer] regularly exerciseDaily caffeine use: 2/day.   Lives with husband. Feels safe in her home.   Social Determinants of Health   Financial Resource Strain: Low Risk  (05/05/2022)   Overall Financial Resource Strain (CARDIA)    Difficulty of Paying Living Expenses: Not hard at all  Food Insecurity: No Food Insecurity (05/05/2022)   Hunger Vital Sign    Worried About Running Out of Food in the Last Year: Never true    Ran Out of Food in the Last Year: Never true  Transportation Needs: No Transportation Needs (05/05/2022)   PRAPARE - Transportation  Lack of Transportation (Medical): No    Lack of Transportation (Non-Medical): No  Physical Activity: Insufficiently Active (05/05/2022)   Exercise Vital Sign    Days of Exercise per  Week: 3 days    Minutes of Exercise per Session: 20 min  Stress: No Stress Concern Present (05/05/2022)   [place]    Feeling of Stress : Not at all  Social Connections: Moderately Isolated (05/05/2022)   Social Connection and Isolation Panel [NHANES]    Frequency of Communication with Friends and Family: More than three times a week    Frequency of Social Gatherings with Friends and Family: Three times a week    Attends Religious Services: Never    Active Member of Clubs or Organizations: No    Attends Archivist Meetings: Never    Marital Status: Married    Tobacco Counseling Counseling given: Not Answered Tobacco comments: quit over 40 years   Clinical Intake:  Pre-visit preparation completed: Yes  Pain : No/denies pain     Nutritional Risks: None Diabetes: Yes CBG done?: No Did pt. bring in CBG monitor from home?: No  How often do you need to have someone help you when you read instructions, pamphlets, or other written materials from your doctor or pharmacy?: 1 - Never  Diabetic?yes Nutrition Risk Assessment:  Has the patient had any N/V/D within the last 2 months?  No  Does the patient have any non-healing wounds?  No  Has the patient had any unintentional weight loss or weight gain?  No   Diabetes:  Is the patient diabetic?  Yes  If diabetic, was a CBG obtained today?  No  Did the patient bring in their glucometer from home?  No  How often do you monitor your CBG's? Once/day  Financial Strains and Diabetes Management:  Are you having any financial strains with the device, your supplies or your medication? No .  Does the patient want to be seen by Chronic Care Management for management of their diabetes?  No  Would the patient like to be referred to a Nutritionist or for Diabetic Management?  No   Diabetic Exams:  Diabetic Eye Exam: Completed 03/04/22.  Pt has been advised about  the importance in completing this exam.   Diabetic Foot Exam: Completed 03/23/22. Pt has been advised about the importance in completing this exam.     Interpreter Needed?: No  Information entered by :: Kirke Shaggy, LPN   Activities of Daily Living    05/05/2022    3:13 PM 05/05/2022    1:12 PM  In your present state of health, do you have any difficulty performing the following activities:  Hearing? 0 0  Vision? 1 1  Difficulty concentrating or making decisions? 0 0  Walking or climbing stairs? 0 0  Dressing or bathing? 1 1  Doing errands, shopping? 0 0  Preparing Food and eating ? N N  Using the Toilet? N N  In the past six months, have you accidently leaked urine? Y Y  Do you have problems with loss of bowel control? N N  Managing your Medications? Y Y  Managing your Finances? N N  Housekeeping or managing your Housekeeping? Delane Ginger Y    Patient Care Team: [place], [name], MD as PCP - General [name], [name], MD as Consulting Physician (Cardiology) Charlton Haws, [facility] as Pharmacist (Pharmacist)  Indicate any recent Medical Services you may have received from other than  [facility] providers in the past year (date may be approximate).     Assessment:   This is a routine wellness examination for Amai.  Hearing/Vision screen Hearing Screening - Comments:: No aids Vision Screening - Comments:: Wears glasses- [facility]  Dietary issues and exercise activities discussed: Current Exercise Habits: Home exercise routine, Type of exercise: walking, Time (Minutes): 20, Frequency (Times/Week): 3, Weekly Exercise (Minutes/Week): 60, Intensity: Mild   Goals Addressed             This Visit's Progress    DIET - EAT MORE FRUITS AND VEGETABLES         Depression Screen    05/05/2022    3:10 PM 03/23/2022   11:25 AM 08/20/2020    3:33 PM 08/16/2019   10:31 AM 08/14/2018   10:55 AM 11/09/2017   10:58 AM 10/24/2017   10:32 AM  PHQ 2/9 Scores  PHQ - 2 Score 0 0 0 0 0 0 0   PHQ- 9 Score 0  0 0 0      Fall Risk    05/05/2022    3:13 PM 05/05/2022    1:12 PM 03/23/2022   11:24 AM 08/20/2020    3:32 PM 08/16/2019   10:31 AM  [place] in the past year? '1 1 1 '$ 0 0  Number falls in past yr: 0 0 0 0 0  Injury with Fall? 0 0 1 0 0  Comment   hit head and shoulder    Risk for fall due to : No Fall Risks;History of fall(s)  Impaired mobility Medication side effect Medication side effect  Follow up Falls evaluation completed;Falls prevention discussed  Falls evaluation completed Falls evaluation completed;Falls prevention discussed Falls evaluation completed;Falls prevention discussed    FALL RISK PREVENTION PERTAINING TO THE HOME:  Any stairs in or around the home? No  If so, are there any without handrails? No  Home free of loose throw rugs in walkways, pet beds, electrical cords, etc? Yes  Adequate lighting in your home to reduce risk of falls? Yes   ASSISTIVE DEVICES UTILIZED TO PREVENT FALLS:  Life alert? No  Use of a cane, walker or w/c? No  Grab bars in the bathroom? Yes  Shower chair or bench in shower? No  Elevated toilet seat or a handicapped toilet? No   Cognitive Function:    08/20/2020    3:36 PM 08/16/2019   10:33 AM 08/14/2018   10:56 AM 08/10/2017   11:35 AM 07/26/2016    3:40 PM  MMSE - Mini Mental State Exam  Orientation to time '5 5 5 5   '$ Orientation to Place '5 5 5 5   '$ Registration '3 3 3 3   '$ Attention/ Calculation 5 5 0 0   Recall '3 3 3 3   '$ Recall-comments     pt was unable to recall 2 of 3 words  Language- name 2 objects   0 0   Language- repeat '1 1 1 1   '$ Language- follow 3 step command   3 3   Language- read & follow direction   0 0   Write a sentence   0 0   Copy design   0 0   Total score   20 20         05/05/2022    3:16 PM  6CIT Screen  What Year? 0 points  What month? 0 points  What time? 0 points  Count back from 20 0  points  Months in reverse 0 points  Repeat phrase 0 points  Total Score 0 points     Immunizations Immunization History  Administered Date(s) Administered   Fluad Quad(high Dose 65+) 08/07/2020   Influenza Split 06/19/2011   Influenza Whole 08/01/2009   Influenza, High Dose Seasonal PF 08/14/2018, 07/03/2019   Influenza,inj,Quad PF,6+ Mos 06/14/2014, 08/25/2015, 07/26/2016, 08/19/2017   Moderna Sars-Covid-2 Vaccination 09/17/2020   PFIZER(Purple Top)SARS-COV-2 Vaccination 11/27/2019, 12/18/2019   Pneumococcal Conjugate-13 08/25/2015   Pneumococcal Polysaccharide-23 08/25/2016   Td 11/25/2003   Tdap 06/14/2014   Zoster, Live 09/16/2014    TDAP status: Up to date  Flu Vaccine status: Declined, Education has been provided regarding the importance of this vaccine but patient still declined. Advised may receive this vaccine at local pharmacy or Health Dept. Aware to provide a copy of the vaccination record if obtained from local pharmacy or Health Dept. Verbalized acceptance and understanding.  Pneumococcal vaccine status: Up to date  Covid-19 vaccine status: Completed vaccines  Qualifies for Shingles Vaccine? Yes   Zostavax completed Yes   Shingrix Completed?: No.    Education has been provided regarding the importance of this vaccine. Patient has been advised to call insurance company to determine out of pocket expense if they have not yet received this vaccine. Advised may also receive vaccine at local pharmacy or Health Dept. Verbalized acceptance and understanding.  Screening Tests Health Maintenance  Topic Date Due   MAMMOGRAM  04/05/2019   Fecal DNA (Cologuard)  09/04/2019   COVID-19 Vaccine (4 - Booster) 11/12/2020   Zoster Vaccines- Shingrix (1 of 2) 06/23/2022 (Originally 02/06/1970)   INFLUENZA VACCINE  05/18/2022   HEMOGLOBIN A1C  09/22/2022   OPHTHALMOLOGY EXAM  03/05/2023   FOOT EXAM  03/24/2023   TETANUS/TDAP  06/14/2024   Pneumonia Vaccine 54+ Years old  Completed   DEXA SCAN  Completed   Hepatitis C Screening  Completed   HPV VACCINES   Aged Out   COLONOSCOPY (Pts 45-38yr Insurance coverage will need to be confirmed)  Discontinued    Health Maintenance  Health Maintenance Due  Topic Date Due   MAMMOGRAM  04/05/2019   Fecal DNA (Cologuard)  09/04/2019   COVID-19 Vaccine (4 - Booster) 11/12/2020    Colorectal cancer screening: Type of screening: Cologuard. Completed 09/03/16. Repeat every 3 years- declined referral  Mammogram status: Completed 11/01/18. Repeat every year  Bone Density status: Completed 11/01/18. Results reflect: Bone density results: OSTEOPENIA. Repeat every 5 years.  Lung Cancer Screening: (Low Dose CT Chest recommended if Age 283-80years, 30 pack-year currently smoking OR have quit w/in 15years.) does not qualify.   Additional Screening:  Hepatitis C Screening: does qualify; Completed 01/13/21  Vision Screening: Recommended annual ophthalmology exams for early detection of glaucoma and other disorders of the eye. Is the patient up to date with their annual eye exam?  Yes  Who is the provider or what is the name of the office in which the patient attends annual eye exams? [facility] pt is not established with a provider, would they like to be referred to a provider to establish care? No .   Dental Screening: Recommended annual dental exams for proper oral hygiene  Community Resource Referral / Chronic Care Management: CRR required this visit?  No   CCM required this visit?  No      Plan:     I have personally reviewed and noted the following in the patient's chart:   Medical and social history  Use of alcohol, tobacco or illicit drugs  Current medications and supplements including opioid prescriptions.  Functional ability and status Nutritional status Physical activity Advanced directives List of other physicians Hospitalizations, surgeries, and ER visits in previous 12 months Vitals Screenings to include cognitive, depression, and falls Referrals and appointments  In  addition, I have reviewed and discussed with patient certain preventive protocols, quality metrics, and best practice recommendations. A written personalized care plan for preventive services as well as general preventive health recommendations were provided to patient.     Dionisio David, LPN   06/20/7954   Nurse Notes: none

## 2022-05-14 ENCOUNTER — Ambulatory Visit (INDEPENDENT_AMBULATORY_CARE_PROVIDER_SITE_OTHER): Payer: Medicare Other | Admitting: Podiatry

## 2022-05-14 DIAGNOSIS — M2040 Other hammer toe(s) (acquired), unspecified foot: Secondary | ICD-10-CM

## 2022-05-14 DIAGNOSIS — E119 Type 2 diabetes mellitus without complications: Secondary | ICD-10-CM

## 2022-05-14 NOTE — Progress Notes (Signed)
Patient presents to pick up Diabetic shoes and inserts.  She was not pleased with the shoes.  She stated she requested black and had asked for a size 9.5.  She stated the Pedorthist was very rude when she came in and got measured.  I apologized to her and informed her he's no longer with the practice.  The shoes were a size 9.  She reviewed the shoe chart and has decided to go with the [place], shoes.  I told her we would return this pair and order her new choice.  I informed her that we would contact her whenever the shoes arrive.

## 2022-05-26 ENCOUNTER — Telehealth: Payer: Self-pay | Admitting: Rheumatology

## 2022-05-26 NOTE — Telephone Encounter (Signed)
Prescription was sent on 04/13/2022 for a 30 day supply with 2 refills. Patient advised to contact the pharmacy as they should have a refill on file for her. Patient expressed understanding.

## 2022-05-26 NOTE — Telephone Encounter (Signed)
Patient called the office requesting a refill of Prednisone '1mg'$  be sent to [facility] in [place].

## 2022-05-28 NOTE — Progress Notes (Signed)
Office Visit Note  Patient: Tina Berger             Date of Birth: [birth year]           MRN: [id number]             PCP: Abner Greenspan, MD Referring: Tower, [name], MD Visit Date: 06/10/2022 Occupation: '@GUAROCC'$ @  Subjective:  Pain in both hands  History of Present Illness: Tina Berger is a 71 y.o. female with history of seronegative rheumatoid arthritis, ILD, and osteoarthritis.  Patient remains on Actemra 162 mg sq injections every 14 days.  Plaquenil was added at her last office visit on 03/29/2022.  She tried 2 doses of plaquenil but discontinued due to severe skin itching and rash.  She is currently on prednisone 7 mg daily.  She has ongoing pain and swelling in both hands but overall her symptoms seem to be stable on the current treatment regimen.  She states that she has had intermittent pain and swelling in the left ankle joint and has been using an Ace wrap at times.  She continues to follow-up closely with pain management.  She denies any new or worsening pulmonary symptoms.  She denies any recent or recurrent infections.  She is planning on having a second Shingrix vaccine as well as the flu vaccine this fall.  Activities of Daily Living:  Patient reports morning stiffness for 2-3 hours.   Patient Denies nocturnal pain.  Difficulty dressing/grooming: Reports Difficulty climbing stairs: Denies Difficulty getting out of chair: Denies Difficulty using hands for taps, buttons, cutlery, and/or writing: Reports  Review of Systems  Constitutional:  Positive for fatigue.  HENT:  Positive for mouth sores and mouth dryness.   Eyes:  Positive for dryness.  Respiratory:  Negative for shortness of breath.   Cardiovascular:  Negative for chest pain and palpitations.  Gastrointestinal:  Positive for constipation. Negative for blood in stool and diarrhea.  Endocrine: Negative for increased urination.  Genitourinary:  Negative for involuntary urination.  Musculoskeletal:  Positive  for joint pain, joint pain, joint swelling and morning stiffness. Negative for gait problem, myalgias, muscle weakness, muscle tenderness and myalgias.  Skin:  Positive for color change and hair loss. Negative for rash and sensitivity to sunlight.  Allergic/Immunologic: Negative for susceptible to infections.  Neurological:  Positive for dizziness and headaches.  Hematological:  Positive for swollen glands.  Psychiatric/Behavioral:  Negative for depressed mood and sleep disturbance. The patient is not nervous/anxious.     PMFS History:  Patient Active Problem List   Diagnosis Date Noted  . Dysuria 09/07/2021  . Rheumatoid arthritis ([place]) 05/27/2021  . Chronic venous insufficiency 05/23/2021  . AVM (arteriovenous malformation) 04/04/2021  . CAD (coronary artery disease) 01/22/2021  . Primary osteoarthritis of both knees 01/12/2021  . Positive ANA (antinuclear antibody) 11/17/2020  . Joint pain 11/13/2020  . Elevated serum creatinine 08/26/2020  . Current use of proton pump inhibitor 08/26/2020  . Estrogen deficiency 08/21/2018  . TMJ (dislocation of temporomandibular joint), initial encounter 05/05/2018  . Anterior neck pain 05/05/2018  . Chronic pain syndrome 11/09/2017  . Chronic upper extremity pain [facility]) (Bilateral) (L>R) 11/09/2017  . Fibromyalgia syndrome 11/09/2017  . Osteoarthritis 11/09/2017  . Osteoarthritis of lumbar facet joint (Bilateral) 11/09/2017  . Lumbar facet arthropathy (Bilateral) 11/09/2017  . Lumbar facet syndrome (Bilateral) (L>R) 11/09/2017  . Lumbar foraminal stenosis (multilevel) (Bilateral) 11/09/2017  . DDD (degenerative disc disease), lumbar 11/09/2017  . Thoracic levoscoliosis  11/09/2017  . Thoracic facet syndrome (Bilateral) (L>R) 11/09/2017  . Thoracic facet arthropathy (Bilateral) (R>L) 11/09/2017  . DDD (degenerative disc disease), thoracic 11/09/2017  . Thoracolumbar IVDD 11/09/2017  . DDD (degenerative disc disease), cervical  11/09/2017  . Osteoarthritis of cervical facet (Bilateral) 11/09/2017  . Grade 1 Anterolisthesis of C7 over T1 11/09/2017  . Cervical foraminal stenosis (Bilateral) 11/09/2017  . History of fusion of cervical spine (C5-6 ACDF) 11/09/2017  . Cervical facet syndrome (Bilateral) 11/09/2017  . Disorder of skeletal system 11/09/2017  . Cervical radiculitis (Bilateral) 11/09/2017  . Lumbar Epidural lipomatosis 11/09/2017  . Chronic sacroiliac joint pain (Bilateral) (L>R) 11/09/2017  . Chronic hip pain (Bilateral) (L>R) 11/09/2017  . Chronic upper back pain (Primary Area of Pain) (midline) 10/24/2017  . Chronic neck pain (Secondary Area of Pain) (Bilateral)  (L>R) 10/24/2017  . Chronic low back pain (Fourth Area of Pain) (Bilateral) (L>R) 10/24/2017  . Chronic lower extremity pain (Fifth Area of Pain) (Bilateral) (L>R) 10/24/2017  . Lumbar Grade 1 Retrolisthesis of L1-2 and L2-3 10/24/2017  . Other long term (current) drug therapy 10/24/2017  . Other specified health status 10/24/2017  . Long term current use of opiate analgesic 10/24/2017  . Long term prescription opiate use 10/24/2017  . Opiate use 10/24/2017  . DM type 2 (diabetes mellitus, type 2) ([place]) 06/14/2014  . Pharmacologic therapy 06/14/2014  . Pedal edema   . Post-menopausal 06/23/2012  . Lumbar disc disease with radiculopathy 11/18/2011  . HTN (hypertension) 07/30/2011  . Raynaud disease 07/30/2011  . Obesity 07/30/2011  . Routine general medical examination at a health care facility 03/28/2011  . Palpitations 04/27/2010  . UTI (urinary tract infection) 03/18/2010  . Depression with anxiety 06/14/2008  . Vitamin D deficiency 05/13/2008  . ANXIETY 03/25/2008  . Osteopenia 03/25/2008  . Hypothyroidism 03/28/2007  . Hyperlipidemia associated with type 2 diabetes mellitus ([place]) 03/28/2007  . Other iron deficiency anemias 03/28/2007  . PANIC ATTACK 03/28/2007  . KERATOCONJUNCTIVITIS SICCA 03/28/2007  . Mitral valve disorder  03/28/2007  . ABNORMAL HEART RHYTHMS 03/28/2007  . Raynaud's syndrome 03/28/2007  . GERD 03/28/2007  . IBS 03/28/2007  . Rosacea 03/28/2007  . PLANTAR FASCIITIS 03/28/2007  . MIGRAINES, HX OF 03/28/2007    Past Medical History:  Diagnosis Date  . Acute renal insufficiency 05/31/2014   pt not aware of this diagnosis  . Anemia, iron deficiency   . Anxiety   . Bilateral lower extremity edema    a. uses torsemide  . Cervical dysplasia    abnormal paps  . Colon cancer screening 06/14/2014  . Coronary artery disease   . Cough 08/08/2012  . Degenerative disk disease 11/19/2011  . Depression   . Diabetes mellitus type II   . Diverticulosis   . DJD (degenerative joint disease)   . Drug rash 05/22/2011  . Dry eyes   . Dysrhythmia    metoprolol.  . Edema   . Elevated liver enzymes 03/21/2012  . Encounter for routine gynecological examination 06/14/2014  . ESOPHAGITIS 03/28/2007   Qualifier: Hospitalized for  By: Marcelino Scot CMA, [place]    . Fibromyalgia   . GASTRITIS 03/28/2007   Qualifier: History of  By: Marcelino Scot CMA, [place]    . GERD (gastroesophageal reflux disease)   . Hemorrhoids    external  . HLD (hyperlipidemia)   . HNP (herniated nucleus pulposus) 11/1997   T6,7,8 with DJD  . HTN (hypertension)   . Hyperglycemia 05/13/2008   Qualifier: Diagnosis of  By:  Tower MD, Carmell Austria   . Hypothyroidism   . Interstitial lung disease ([place])    pt not aware of this  . Left ovarian cyst    x 3, rupture  . Osteoarthritis    hands  . Osteopenia    mild-11/01; improved 12/05  . Other screening mammogram 08/18/2011  . Palpitations   . PERSONAL HISTORY ALLERGY UNSPEC MEDICINAL AGENT 03/18/2010   Qualifier: Diagnosis of  By: Glori Bickers MD, Carmell Austria   . PONV (postoperative nausea and vomiting)   . Raynaud's disease   . Recurrent HSV (herpes simplex virus)    lesions in nose or mouth with frequent ulcers. d/t meds and dry mouth  . Rheumatoid arthritis ([place])   . Rhinitis  03/21/2012  . Routine general medical examination at a health care facility 03/28/2011    Family History  Problem Relation Age of Onset  . Lung cancer Mother        + smoker  . Coronary artery disease Mother        relatively young  . Emphysema Father        + smoker  . Lymphoma Sister   . Heart disease Brother   . Lymphoma Brother   . Parkinson's disease Brother   . Anemia Brother        aplastic   . Diabetes Brother   . Iron deficiency Daughter   . Leukemia Son   . Breast cancer Neg Hx    Past Surgical History:  Procedure Laterality Date  . ABDOMINAL EXPLORATION SURGERY    . ACDF  1991  . BIOPSY OF SKIN SUBCUTANEOUS TISSUE AND/OR MUCOUS MEMBRANE Left 04/08/2021   Procedure: BIOPSY OF SKIN SUBCUTANEOUS TISSUE AND/OR MUCOUS MEMBRANE ( REMOVAL SKIN NODULE);  Surgeon: Katha Cabal, MD;  Location: [facility] ORS;  Service: Vascular;  Laterality: Left;  . COLONOSCOPY  08/2000   Diverticulosis; hemorrhoids  . HAND SURGERY Left    left thumb. PIN REMOVED  . LASIK     bilateral   Social History   Social History Narrative   Married1 Investment banker, corporate to dean at [employer] regularly exerciseDaily caffeine use: 2/day.   Lives with husband. Feels safe in her home.   Immunization History  Administered Date(s) Administered  . Fluad Quad(high Dose 65+) 08/07/2020  . Influenza Split 06/19/2011  . Influenza Whole 08/01/2009  . Influenza, High Dose Seasonal PF 08/14/2018, 07/03/2019  . Influenza,inj,Quad PF,6+ Mos 06/14/2014, 08/25/2015, 07/26/2016, 08/19/2017  . Moderna Sars-Covid-2 Vaccination 09/17/2020  . PFIZER(Purple Top)SARS-COV-2 Vaccination 11/27/2019, 12/18/2019  . Pneumococcal Conjugate-13 08/25/2015  . Pneumococcal Polysaccharide-23 08/25/2016  . Td 11/25/2003  . Tdap 06/14/2014  . Zoster, Live 09/16/2014     Objective: Vital Signs: BP 129/83 (BP Location: Left Arm, Patient Position: Sitting, Cuff Size: Normal)   Pulse 84   Resp 17   Ht '5\' 2"'$  (1.575 m)   Wt 186 lb  12.8 oz (84.7 kg)   BMI 34.17 kg/m    Physical Exam Vitals and nursing note reviewed.  Constitutional:      Appearance: She is well-developed.  HENT:     Head: Normocephalic and atraumatic.  Eyes:     Conjunctiva/sclera: Conjunctivae normal.  Cardiovascular:     Rate and Rhythm: Normal rate and regular rhythm.     Heart sounds: Normal heart sounds.  Pulmonary:     Effort: Pulmonary effort is normal.     Breath sounds: Normal breath sounds.     Comments: Mild crackles at bilateral lung bases  Abdominal:  General: Bowel sounds are normal.     Palpations: Abdomen is soft.  Musculoskeletal:     Cervical back: Normal range of motion.  Skin:    General: Skin is warm and dry.     Capillary Refill: Capillary refill takes less than 2 seconds.  Neurological:     Mental Status: She is alert and oriented to person, place, and time.  Psychiatric:        Behavior: Behavior normal.     Musculoskeletal Exam: C-spine has limited range of motion with lateral rotation.  Thoracic basis noted.  Shoulder joints have good range of motion.  Elbow joints have good range of motion with no tenderness or synovitis.  Wrist joints have good range of motion with no tenderness or synovitis.  Tenderness and synovitis over several CPN PIP joints as described below.  Incomplete fist formation noted bilaterally.  Hip joints have limited range of motion bilaterally.  Knee joints have good range of motion with no warmth or effusion.  Some tenderness over the left ankle joint.  overcrowding of toes noted.  CDAI Exam: CDAI Score: 15.2  Patient Global: 6 mm; Provider Global: 6 mm Swollen: 7 ; Tender: 7  Joint Exam 06/10/2022      Right  Left  MCP 1  Swollen Tender     MCP 2  Swollen Tender     PIP 2  Swollen Tender  Swollen Tender  PIP 3  Swollen Tender  Swollen Tender  PIP 5  Swollen Tender        Investigation: No additional findings.  Imaging: No results found.  Recent Labs: Lab Results   Component Value Date   WBC 7.0 03/04/2022   HGB 14.5 03/04/2022   PLT 289 03/04/2022   NA 140 03/04/2022   K 4.7 03/04/2022   CL 98 03/04/2022   CO2 25 03/04/2022   GLUCOSE 135 (H) 03/04/2022   BUN 19 03/04/2022   CREATININE 0.95 03/04/2022   BILITOT 0.2 03/04/2022   ALKPHOS 67 03/04/2022   AST 25 03/04/2022   ALT 23 03/04/2022   PROT 7.6 03/04/2022   ALBUMIN 4.6 03/04/2022   CALCIUM 9.5 03/04/2022   GFRAA 75 03/24/2021   QFTBGOLDPLUS NEGATIVE 02/03/2022    Speciality Comments: Arava- mood disorder  Procedures:  No procedures performed Allergies: Ceftin [cefuroxime], Clonidine derivatives, Erythromycin, Keflex [cephalexin], Penicillins, Sulfonamide derivatives, Ciprofloxacin, Fluoxetine hcl, Furosemide, Gabapentin, Hydroxychloroquine, Paroxetine, Pregabalin, Tetracycline, Venlafaxine, Etodolac, Macrobid [nitrofurantoin], Amitriptyline hcl, Atorvastatin, Benicar [olmesartan medoxomil], Cephalexin, Cetirizine hcl, Diovan [valsartan], and Naproxen sodium      Assessment / Plan:     Visit Diagnoses: Rheumatoid arthritis of multiple sites with negative rheumatoid factor [facility]): Patient presents today with ongoing tenderness and synovitis involving several MCP and PIP joints.  Incomplete fist formation noted bilaterally.  She remains on Actemra 162 mg subcutaneous injections every 14 days.  At her last office visit on 03/29/2022 Plaquenil was added.  She tried taking Plaquenil for 2 days but discontinued due to severe skin itching and a rash.  In the past she previously tried [place] and Imuran but discontinued due to intolerance.  She is not a good candidate for sulfasalazine due to a sulfa allergy.  She is not a good candidate for methotrexate due to underlying ILD.  She remains on prednisone 7 mg daily.  Discussed that she may be unable to taper prednisone further due to ongoing inflammation despite remaining on Actemra as prescribed.  Discussed that ideally she should remain on the lowest  dose of prednisone possible that we will control her disease.  She plans on trying to reduce prednisone by 1 mg every month until she reaches 5 mg daily if possible.  She will remain on Actemra as prescribed.  She will follow-up in the office in 3 months or sooner if needed.  High risk medication use - Actemra 162 mg subcutaneous injections every 14 days.  Prednisone 7 mg daily.  - Plan: COMPLETE METABOLIC PANEL WITH GFR, CBC with Differential/Platelet Previous therapy includes: Arava, Plaquenil, Imuran.  Not a good candidate for methotrexate due to history of ILD. CBC and CMP updated on 03/04/22. Orders for CBC and CMP released today.  Her next lab work will be due in November and every 3 months.  TB gold negative on 02/03/22.  She has not had any recent or recurrent infections.  Discussed the importance of holding actemra if she develops signs or symptoms of an infection and to resume once the infection has completely cleared.   ILD (interstitial lung disease) ([place]) - HR chest CT ordered on 01/16/2021: Mildy progressive compared to [address]yr[address]. Patient was evaluated by Dr. RChase Calleron 03/26/2022: Mild interstitial lung disease since 2013- very mild progression through 2022.  Dr. RChase Caller recommended continuing Actemra as prescribed and to have repeat PFTs spirometry and DLCO.  No new or worsening pulmonary symptoms.   Raynaud's disease without gangrene: Cyanosis of toes noted.   Chronic left shoulder pain: She has good ROM with no discomfort at this time.  Primary osteoarthritis of both knees: She has good ROM of both knee joint with no warmth or effusion.   DDD (degenerative disc disease), cervical: C-spine has limited range of motion especially without rotation.  History of fusion of cervical spine (C5-6 ACDF): C-spine has limited range of motion especially with lateral rotation.  No symptoms of radiculopathy at this time.  DDD (degenerative disc disease), thoracic: Thoracic kyphosis noted.  DDD  (degenerative disc disease), lumbar: Painful range of motion of the lumbar spine.  Fibromyalgia syndrome: She has generalized hyperalgesia and positive tender points on exam.  She continues to experience generalized myalgias and muscle tenderness due to fibromyalgia.  Previously discontinued Lyrica due to intolerance.  She is followed closely by pain management.  Chronic pain syndrome: She follows up closely with pain management.  Long term current use of opiate analgesic -Followed by pain management.  Other medical conditions are listed as follows:   Type 2 diabetes mellitus without complication, without long-term current use of insulin ([place]  Depression with anxiety  Chronic renal impairment, stage 3a ([place]  History of hypothyroidism  Hyperlipidemia associated with type 2 diabetes mellitus ([place]  History of IBS  Hx of arteriovenous malformation (AVM) - Status post removal of AV malformation left leg.  Followed by vascular surgery-Dr. SDelana Meyer   Primary hypertension: BP was 129/83 today in the office.   Mitral valve disorder  Venous ulcer of left leg ([facility]) - Ulceration has healed.  History of gastroesophageal reflux (GERD)  Orders: Orders Placed This Encounter  Procedures  . COMPLETE METABOLIC PANEL WITH GFR  . CBC with Differential/Platelet   No orders of the defined types were placed in this encounter.   Follow-Up Instructions: Return in about 3 months (around 09/10/2022) for Rheumatoid arthritis, ILD, Osteoarthritis.   TOfilia Neas PA-C  Note - This record has been created using Dragon software.  Chart creation errors have been sought, but may not always  have been located. Such creation errors do not reflect  on  the standard of medical care.

## 2022-06-04 ENCOUNTER — Telehealth: Payer: Self-pay | Admitting: Rheumatology

## 2022-06-04 MED ORDER — PREDNISONE 1 MG PO TABS: ORAL_TABLET | ORAL | 1 refills | Status: DC

## 2022-06-04 NOTE — Telephone Encounter (Signed)
Patient called the office requesting a refill of Prednisone '1mg'$ . Patient states she only has one day left of medication. [facility] [place].

## 2022-06-04 NOTE — Telephone Encounter (Signed)
Next Visit: 06/10/2022  Last Visit: 03/29/2022  Last Fill: 04/13/2022  DX: Rheumatoid arthritis of multiple sites with negative rheumatoid factor  Current Dose per office note on 03/29/2022: prednisone to 15 mg p.o. daily for 2 days, 10 mg p.o. daily for 2 days and then resume prednisone 6 mg p.o. daily.    Patient has used all of the prednisone '1mg'$  due to increasing the dose of prednisone. Patient did increase the dose to '15mg'$  for 2 days and 10 for 2 days. Patient then decided to taper prednisone by 1 mg every 2 weeks. Patient states she will now be decreasing to '7mg'$  po qd.   Okay to refill prednisone '1mg'$  tablets?

## 2022-06-10 ENCOUNTER — Ambulatory Visit: Payer: Medicare Other | Attending: Physician Assistant | Admitting: Physician Assistant

## 2022-06-10 ENCOUNTER — Encounter: Payer: Self-pay | Admitting: Physician Assistant

## 2022-06-10 ENCOUNTER — Other Ambulatory Visit: Payer: Self-pay

## 2022-06-10 VITALS — BP 129/83 | HR 84 | Resp 17 | Ht 62.0 in | Wt 186.8 lb

## 2022-06-10 DIAGNOSIS — M5030 Other cervical disc degeneration, unspecified cervical region: Secondary | ICD-10-CM

## 2022-06-10 DIAGNOSIS — M5136 Other intervertebral disc degeneration, lumbar region: Secondary | ICD-10-CM

## 2022-06-10 DIAGNOSIS — M25512 Pain in left shoulder: Secondary | ICD-10-CM | POA: Diagnosis not present

## 2022-06-10 DIAGNOSIS — Z8774 Personal history of (corrected) congenital malformations of heart and circulatory system: Secondary | ICD-10-CM

## 2022-06-10 DIAGNOSIS — M170 Bilateral primary osteoarthritis of knee: Secondary | ICD-10-CM | POA: Diagnosis not present

## 2022-06-10 DIAGNOSIS — Z79899 Other long term (current) drug therapy: Secondary | ICD-10-CM | POA: Diagnosis not present

## 2022-06-10 DIAGNOSIS — N1831 Chronic kidney disease, stage 3a: Secondary | ICD-10-CM

## 2022-06-10 DIAGNOSIS — J849 Interstitial pulmonary disease, unspecified: Secondary | ICD-10-CM | POA: Diagnosis not present

## 2022-06-10 DIAGNOSIS — I059 Rheumatic mitral valve disease, unspecified: Secondary | ICD-10-CM

## 2022-06-10 DIAGNOSIS — G894 Chronic pain syndrome: Secondary | ICD-10-CM | POA: Diagnosis not present

## 2022-06-10 DIAGNOSIS — E1169 Type 2 diabetes mellitus with other specified complication: Secondary | ICD-10-CM

## 2022-06-10 DIAGNOSIS — M0609 Rheumatoid arthritis without rheumatoid factor, multiple sites: Secondary | ICD-10-CM

## 2022-06-10 DIAGNOSIS — M5134 Other intervertebral disc degeneration, thoracic region: Secondary | ICD-10-CM

## 2022-06-10 DIAGNOSIS — M797 Fibromyalgia: Secondary | ICD-10-CM | POA: Diagnosis not present

## 2022-06-10 DIAGNOSIS — E119 Type 2 diabetes mellitus without complications: Secondary | ICD-10-CM

## 2022-06-10 DIAGNOSIS — Z79891 Long term (current) use of opiate analgesic: Secondary | ICD-10-CM

## 2022-06-10 DIAGNOSIS — Z8719 Personal history of other diseases of the digestive system: Secondary | ICD-10-CM

## 2022-06-10 DIAGNOSIS — I7300 Raynaud's syndrome without gangrene: Secondary | ICD-10-CM | POA: Diagnosis not present

## 2022-06-10 DIAGNOSIS — E785 Hyperlipidemia, unspecified: Secondary | ICD-10-CM

## 2022-06-10 DIAGNOSIS — Z8639 Personal history of other endocrine, nutritional and metabolic disease: Secondary | ICD-10-CM

## 2022-06-10 DIAGNOSIS — I83029 Varicose veins of left lower extremity with ulcer of unspecified site: Secondary | ICD-10-CM

## 2022-06-10 DIAGNOSIS — F418 Other specified anxiety disorders: Secondary | ICD-10-CM

## 2022-06-10 DIAGNOSIS — Z981 Arthrodesis status: Secondary | ICD-10-CM

## 2022-06-10 DIAGNOSIS — L97929 Non-pressure chronic ulcer of unspecified part of left lower leg with unspecified severity: Secondary | ICD-10-CM

## 2022-06-10 DIAGNOSIS — I10 Essential (primary) hypertension: Secondary | ICD-10-CM

## 2022-06-10 DIAGNOSIS — M51369 Other intervertebral disc degeneration, lumbar region without mention of lumbar back pain or lower extremity pain: Secondary | ICD-10-CM

## 2022-06-10 DIAGNOSIS — G8929 Other chronic pain: Secondary | ICD-10-CM

## 2022-06-10 MED ORDER — ACTEMRA ACTPEN 162 MG/0.9ML ~~LOC~~ SOAJ: 162.0000 mg | SUBCUTANEOUS | 0 refills | Status: DC

## 2022-06-10 NOTE — Telephone Encounter (Signed)
Please review and sign pended actemra refill that was requested today at patient's appointment. Thanks!

## 2022-06-10 NOTE — Patient Instructions (Signed)
Standing Labs We placed an order today for your standing lab work.   Please have your standing labs drawn in November and every 3 months   If possible, please have your labs drawn 2 weeks prior to your appointment so that the provider can discuss your results at your appointment.  Please note that you may see your imaging and lab results in [place] before we have reviewed them. We may be awaiting multiple results to interpret others before contacting you. Please allow our office up to 72 hours to thoroughly review all of the results before contacting the office for clarification of your results.  We currently have open lab daily: Monday through Thursday from 1:30 PM-4:30 PM and Friday from 1:30 PM- 4:00 PM If possible, please come for your lab work on Monday, Thursday or Friday afternoons, as you may experience shorter wait times.   Effective August 18, 2022 the new lab hours will change to: Monday through Thursday from 1:30 PM-5:00 PM and Friday from 8:30 AM-12:00 PM If possible, please come for your lab work on Monday and Thursday afternoons, as you may experience shorter wait times.  Please be advised, all patients with office appointments requiring lab work will take precedent over walk-in lab work.    The office is located at [address] [place], [place], [place] [address] No appointment is necessary.   Labs are drawn by [facility]. Please bring your co-pay at the time of your lab draw.  You may receive a bill from [facility] for your lab work.  Please note if you are on Hydroxychloroquine and and an order has been placed for a Hydroxychloroquine level, you will need to have it drawn 4 hours or more after your last dose.  If you wish to have your labs drawn at another location, please call the office 24 hours in advance to send orders.  If you have any questions regarding directions or hours of operation,  please call [phone].   As a reminder, please drink plenty of water prior  to coming for your lab work. Thanks!   If you have signs or symptoms of an infection or start antibiotics: First, call your PCP for workup of your infection. Hold your medication through the infection, until you complete your antibiotics, and until symptoms resolve if you take the following: Injectable medication (Actemra, Benlysta, Cimzia, Cosentyx, Enbrel, Humira, Kevzara, Orencia, Remicade, Simponi, Stelara, Taltz, Tremfya) Methotrexate Leflunomide (Arava) Mycophenolate (Cellcept) Morrie Sheldon, Olumiant, or Rinvoq

## 2022-06-11 LAB — CBC WITH DIFFERENTIAL/PLATELET
Absolute Monocytes: 537 cells/uL (ref 200–950)
Basophils Absolute: 44 cells/uL (ref 0–200)
Basophils Relative: 0.5 %
Eosinophils Absolute: 185 cells/uL (ref 15–500)
Eosinophils Relative: 2.1 %
HCT: 43.6 % (ref 35.0–45.0)
Hemoglobin: 14.4 g/dL (ref 11.7–15.5)
Lymphs Abs: 1082 cells/uL (ref 850–3900)
MCH: 29.6 pg (ref 27.0–33.0)
MCHC: 33 g/dL (ref 32.0–36.0)
MCV: 89.7 fL (ref 80.0–100.0)
MPV: 9.6 fL (ref 7.5–12.5)
Monocytes Relative: 6.1 %
Neutro Abs: 6952 cells/uL (ref 1500–7800)
Neutrophils Relative %: 79 %
Platelets: 298 10*3/uL (ref 140–400)
RBC: 4.86 10*6/uL (ref 3.80–5.10)
RDW: 11.9 % (ref 11.0–15.0)
Total Lymphocyte: 12.3 %
WBC: 8.8 10*3/uL (ref 3.8–10.8)

## 2022-06-11 LAB — COMPLETE METABOLIC PANEL WITH GFR
AG Ratio: 1.3 (calc) (ref 1.0–2.5)
ALT: 18 U/L (ref 6–29)
AST: 21 U/L (ref 10–35)
Albumin: 4.3 g/dL (ref 3.6–5.1)
Alkaline phosphatase (APISO): 59 U/L (ref 37–153)
BUN/Creatinine Ratio: 18 (calc) (ref 6–22)
BUN: 19 mg/dL (ref 7–25)
CO2: 25 mmol/L (ref 20–32)
Calcium: 9.5 mg/dL (ref 8.6–10.4)
Chloride: 99 mmol/L (ref 98–110)
Creat: 1.05 mg/dL — ABNORMAL HIGH (ref 0.60–1.00)
Globulin: 3.2 g/dL (calc) (ref 1.9–3.7)
Glucose, Bld: 118 mg/dL — ABNORMAL HIGH (ref 65–99)
Potassium: 5.2 mmol/L (ref 3.5–5.3)
Sodium: 140 mmol/L (ref 135–146)
Total Bilirubin: 0.4 mg/dL (ref 0.2–1.2)
Total Protein: 7.5 g/dL (ref 6.1–8.1)
eGFR: 56 mL/min/{1.73_m2} — ABNORMAL LOW (ref >=60)

## 2022-06-11 NOTE — Progress Notes (Signed)
CBC WNL.   Creatinine is borderline elevated-1.05 and GFR is borderline low-56. Glucose is elevated-118.  Rest of CMP WNL.  Recommend avoiding the use of NSAIDs.

## 2022-06-18 ENCOUNTER — Other Ambulatory Visit: Payer: Self-pay | Admitting: Rheumatology

## 2022-06-25 ENCOUNTER — Encounter: Payer: Medicare Other | Admitting: Family Medicine

## 2022-06-25 IMAGING — CT CT CHEST HIGH RESOLUTION W/O CM
2 of 7 series · 12 of 36 positions shown, 15 images · non-contrast
Comparison: Chest CT 04/03/2012.

CLINICAL DATA: 69-year-old female with history of shortness of
breath. History of rheumatoid arthritis. Shortness of breath with
activity.

EXAM:
CT CHEST WITHOUT CONTRAST
TECHNIQUE: Multidetector CT imaging of the chest was performed following the
standard protocol without intravenous contrast. High resolution
imaging of the lungs, as well as inspiratory and expiratory imaging,
was performed.

[Series 4: thorax 2.00 cor · coronal · 0.55mm/px · 3 of 157 slices shown]
[im 32/157  lung]
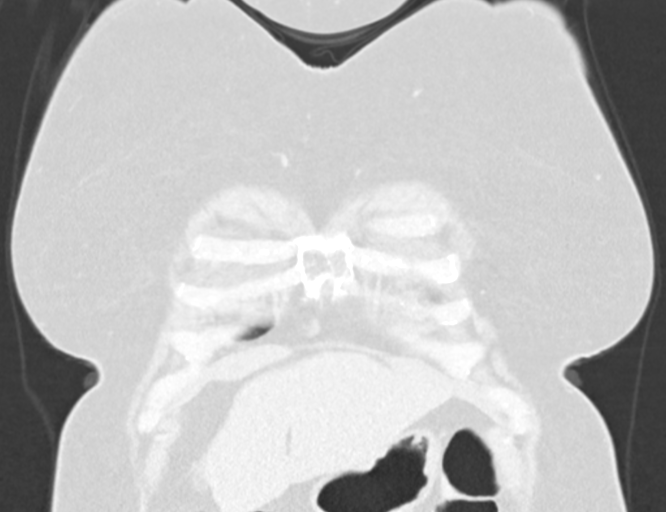
[im 63/157  lung]
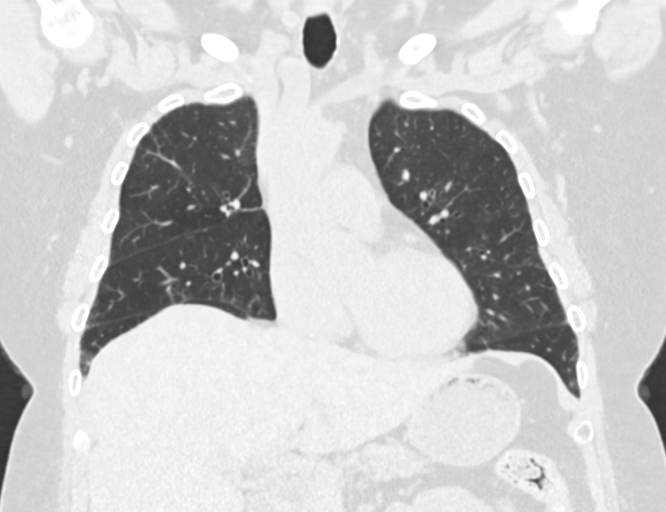
[im 94/157  lung]
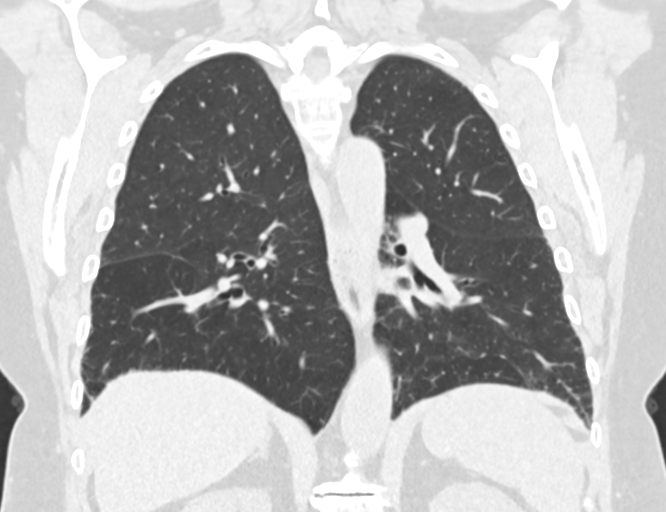

[Series 10: high res (id) thorax 1.00 ax · axial · 0.61mm/px · z∈[-1262,-1027]mm · 9 of 279 slices shown, 12 images]
[im 22/279  mediastinal]
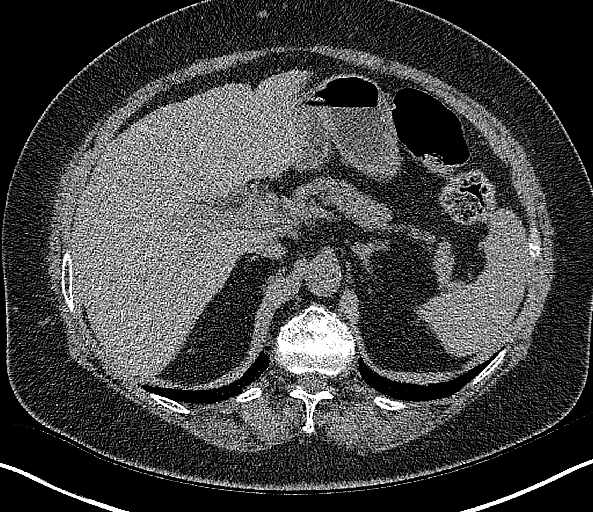
[im 22/279  lung]
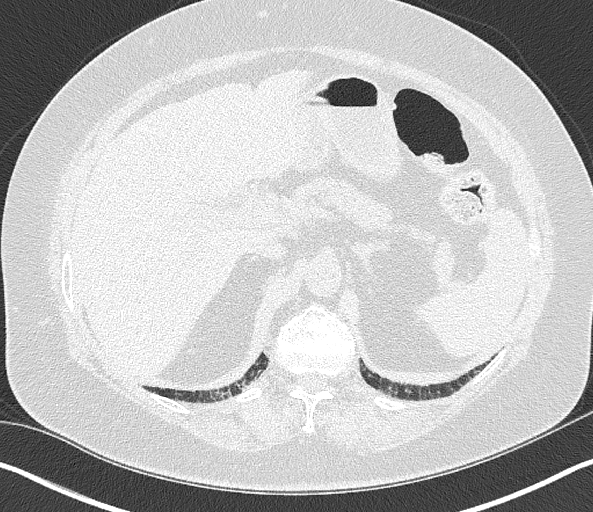
[im 65/279  lung]
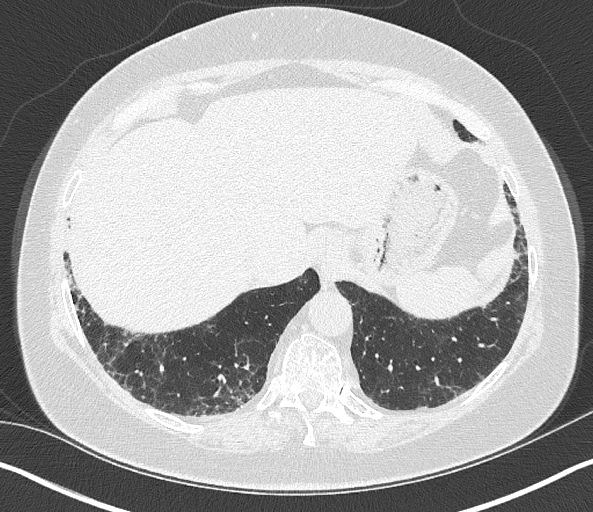
[im 86/279  lung]
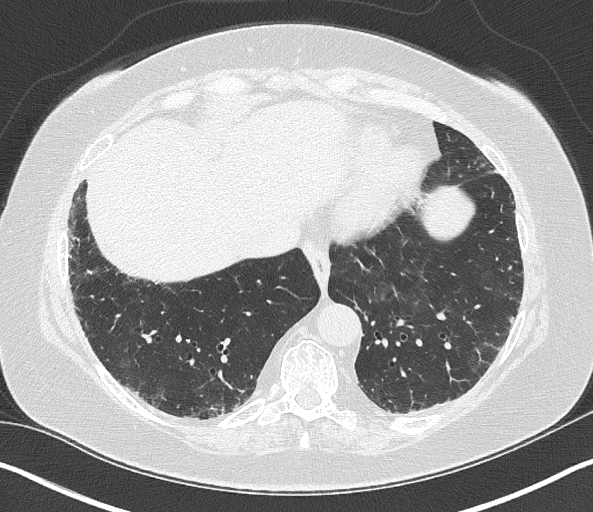
[im 107/279  lung]
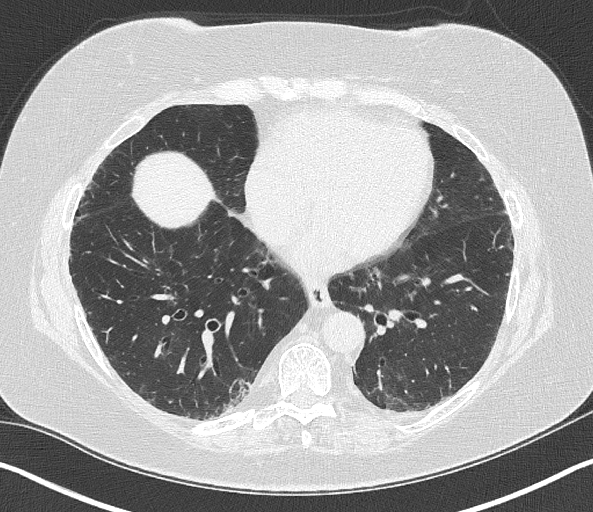
[im 150/279  mediastinal]
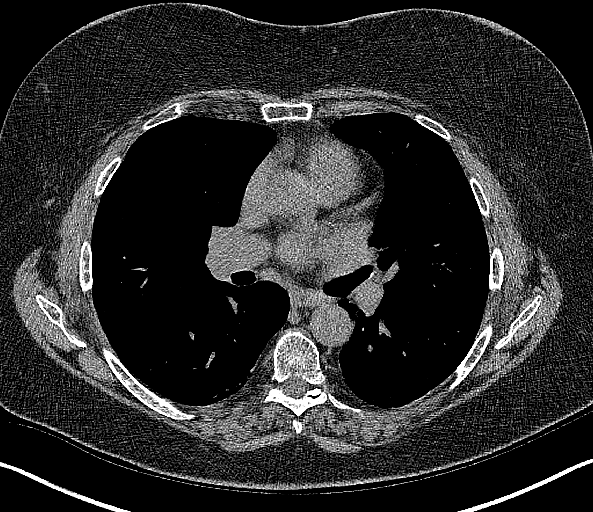
[im 150/279  lung]
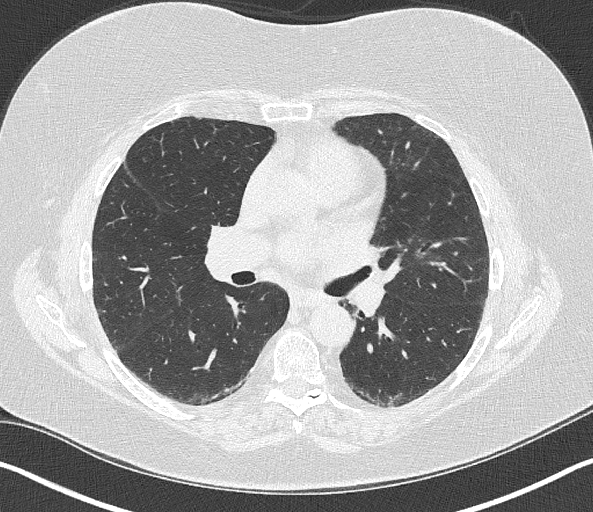
[im 172/279  lung]
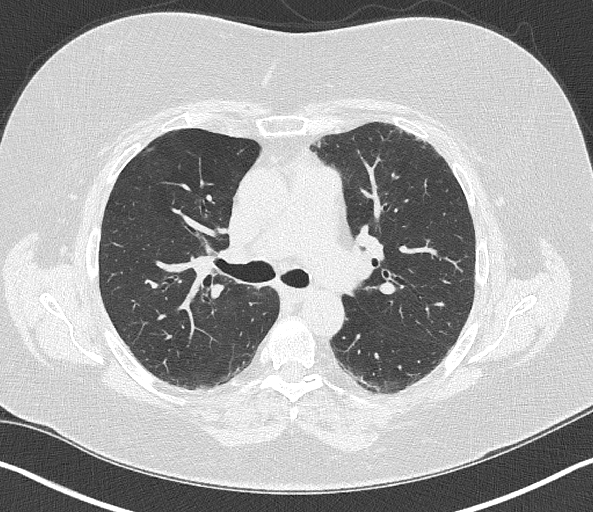
[im 193/279  lung]
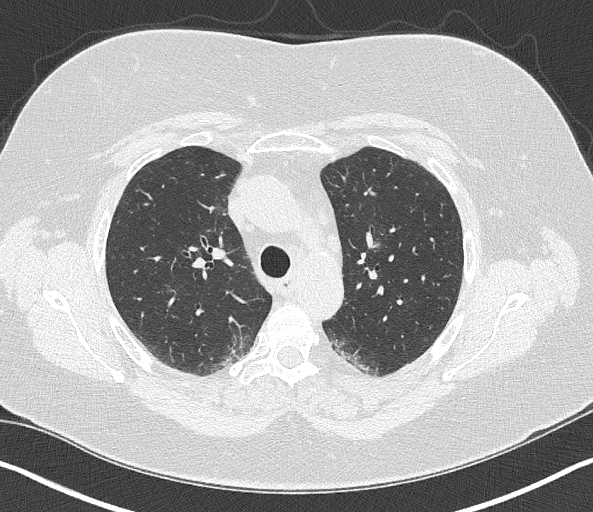
[im 236/279  lung]
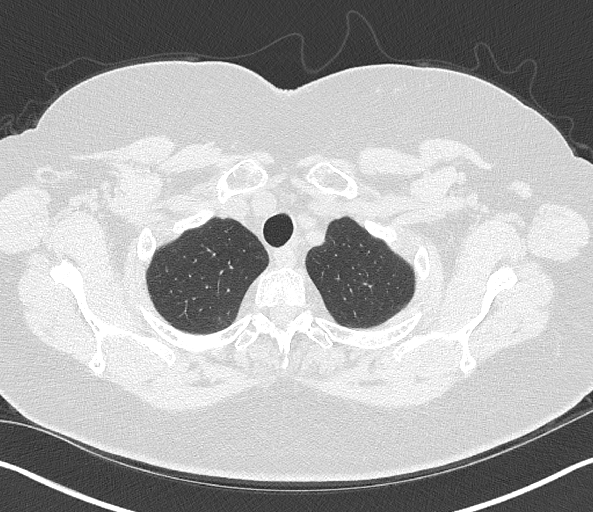
[im 257/279  mediastinal]
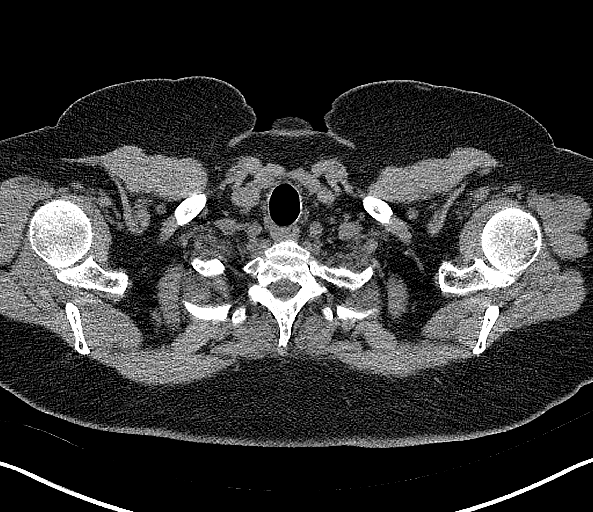
[im 257/279  lung]
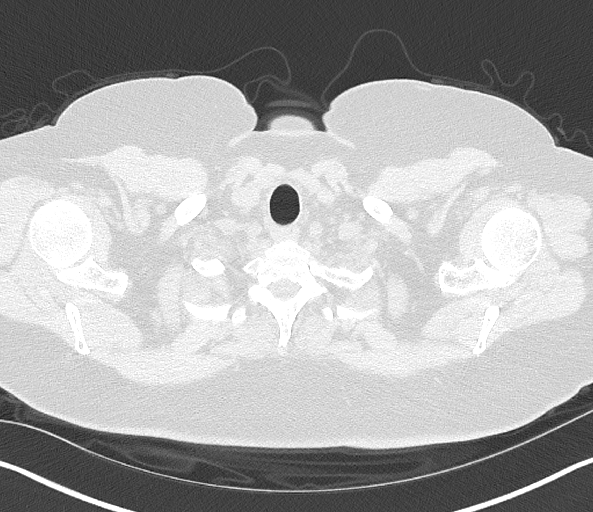

[12 of 36 positions shown; findings below may reference images not displayed]

FINDINGS: Cardiovascular: Heart size is normal. There is no significant
pericardial fluid, thickening or pericardial calcification. There is
aortic atherosclerosis, as well as atherosclerosis of the great
vessels of the mediastinum and the coronary arteries, including
calcified atherosclerotic plaque in the left main, left anterior
descending and right coronary arteries.

Mediastinum/Nodes: No pathologically enlarged mediastinal or hilar
lymph nodes. Please note that accurate exclusion of hilar adenopathy
is limited on noncontrast CT scans. Esophagus is unremarkable in
appearance. No axillary lymphadenopathy.

Lungs/Pleura: High-resolution images demonstrate patchy areas of
ground-glass attenuation, septal thickening, mild subpleural
reticulation and peripheral bronchiolectasis scattered throughout
the lungs bilaterally. Findings have a definitive craniocaudal
gradient and appear mildly progressive compared to the prior study,
with slightly less ground-glass attenuation but more septal
thickening and subpleural reticulation. No frank honeycombing.
Inspiratory and expiratory imaging demonstrates some mild air
trapping indicative of small airways disease. In addition, there is
collapse of the trachea and mainstem bronchi during expiration. No
acute consolidative airspace disease. No pleural effusions. No
suspicious appearing pulmonary nodules or masses are noted.

Upper Abdomen: Unremarkable.

Musculoskeletal: There are no aggressive appearing lytic or blastic
lesions noted in the visualized portions of the skeleton.
IMPRESSION: 1. The appearance of the lungs is compatible with interstitial lung
disease which is mildly progressive when compared to remote prior
study from 9 years ago. The spectrum of findings on today's
examination is categorized as probable usual interstitial pneumonia
(UIP) per current ATS guidelines.
2. Aortic atherosclerosis, in addition to left main and 2 vessel
coronary artery disease. Assessment for potential risk factor
modification, dietary therapy or pharmacologic therapy may be
warranted, if clinically indicated.

Aortic Atherosclerosis ([id number]).

## 2022-07-03 ENCOUNTER — Telehealth: Payer: Self-pay | Admitting: Family Medicine

## 2022-07-03 DIAGNOSIS — E039 Hypothyroidism, unspecified: Secondary | ICD-10-CM

## 2022-07-03 DIAGNOSIS — E559 Vitamin D deficiency, unspecified: Secondary | ICD-10-CM

## 2022-07-03 DIAGNOSIS — E1169 Type 2 diabetes mellitus with other specified complication: Secondary | ICD-10-CM

## 2022-07-03 DIAGNOSIS — I10 Essential (primary) hypertension: Secondary | ICD-10-CM

## 2022-07-03 DIAGNOSIS — Z79899 Other long term (current) drug therapy: Secondary | ICD-10-CM

## 2022-07-03 DIAGNOSIS — E119 Type 2 diabetes mellitus without complications: Secondary | ICD-10-CM

## 2022-07-03 NOTE — Telephone Encounter (Signed)
-----   Message from Ellamae Sia sent at 06/29/2022  3:09 PM EDT ----- Regarding: Lab orders for Tuesday, 9.19.23 Patient is scheduled for CPX labs, please order future labs, Thanks , Karna Christmas

## 2022-07-05 ENCOUNTER — Telehealth: Payer: Self-pay | Admitting: Family Medicine

## 2022-07-05 ENCOUNTER — Other Ambulatory Visit: Payer: Medicare Other

## 2022-07-05 NOTE — Telephone Encounter (Signed)
Pt.notified

## 2022-07-05 NOTE — Telephone Encounter (Signed)
Patient is scheduled for labs tomorrow morning 9/19. She would like to know if those lab orders can be sent over to [facility] on [address] in [place]? She stated that due to her arthiritis,mornings do not work well for her and that [place] location is closer to her.

## 2022-07-05 NOTE — Addendum Note (Signed)
Addended by: Ellamae Sia on: 07/05/2022 11:28 AM   Modules accepted: Orders

## 2022-07-06 ENCOUNTER — Other Ambulatory Visit: Payer: Medicare Other

## 2022-07-06 DIAGNOSIS — E559 Vitamin D deficiency, unspecified: Secondary | ICD-10-CM | POA: Diagnosis not present

## 2022-07-06 DIAGNOSIS — E785 Hyperlipidemia, unspecified: Secondary | ICD-10-CM | POA: Diagnosis not present

## 2022-07-06 DIAGNOSIS — E039 Hypothyroidism, unspecified: Secondary | ICD-10-CM | POA: Diagnosis not present

## 2022-07-06 DIAGNOSIS — I10 Essential (primary) hypertension: Secondary | ICD-10-CM | POA: Diagnosis not present

## 2022-07-06 DIAGNOSIS — E1169 Type 2 diabetes mellitus with other specified complication: Secondary | ICD-10-CM | POA: Diagnosis not present

## 2022-07-06 DIAGNOSIS — E119 Type 2 diabetes mellitus without complications: Secondary | ICD-10-CM | POA: Diagnosis not present

## 2022-07-07 LAB — CBC WITH DIFFERENTIAL/PLATELET
Basophils Absolute: 0.1 10*3/uL (ref 0.0–0.2)
Basos: 1 %
EOS (ABSOLUTE): 0.2 10*3/uL (ref 0.0–0.4)
Eos: 2 %
Hematocrit: 40 % (ref 34.0–46.6)
Hemoglobin: 13.4 g/dL (ref 11.1–15.9)
Immature Grans (Abs): 0.1 10*3/uL (ref 0.0–0.1)
Immature Granulocytes: 1 %
Lymphocytes Absolute: 1.4 10*3/uL (ref 0.7–3.1)
Lymphs: 16 %
MCH: 29.6 pg (ref 26.6–33.0)
MCHC: 33.5 g/dL (ref 31.5–35.7)
MCV: 88 fL (ref 79–97)
Monocytes Absolute: 0.8 10*3/uL (ref 0.1–0.9)
Monocytes: 8 %
Neutrophils Absolute: 6.6 10*3/uL (ref 1.4–7.0)
Neutrophils: 72 %
Platelets: 319 10*3/uL (ref 150–450)
RBC: 4.53 x10E6/uL (ref 3.77–5.28)
RDW: 12 % (ref 11.7–15.4)
WBC: 9.1 10*3/uL (ref 3.4–10.8)

## 2022-07-07 LAB — MICROALBUMIN / CREATININE URINE RATIO
Creatinine, Urine: 94.9 mg/dL
Microalb/Creat Ratio: 15 mg/g creat (ref 0–29)
Microalbumin, Urine: 14.3 ug/mL

## 2022-07-07 LAB — COMPREHENSIVE METABOLIC PANEL
ALT: 17 IU/L (ref 0–32)
AST: 15 IU/L (ref 0–40)
Albumin/Globulin Ratio: 1.4 (ref 1.2–2.2)
Albumin: 4.2 g/dL (ref 3.8–4.8)
Alkaline Phosphatase: 81 IU/L (ref 44–121)
BUN/Creatinine Ratio: 21 (ref 12–28)
BUN: 18 mg/dL (ref 8–27)
Bilirubin Total: 0.4 mg/dL (ref 0.0–1.2)
CO2: 26 mmol/L (ref 20–29)
Calcium: 9.6 mg/dL (ref 8.7–10.3)
Chloride: 98 mmol/L (ref 96–106)
Creatinine, Ser: 0.85 mg/dL (ref 0.57–1.00)
Globulin, Total: 2.9 g/dL (ref 1.5–4.5)
Glucose: 178 mg/dL — ABNORMAL HIGH (ref 70–99)
Potassium: 5.3 mmol/L — ABNORMAL HIGH (ref 3.5–5.2)
Sodium: 140 mmol/L (ref 134–144)
Total Protein: 7.1 g/dL (ref 6.0–8.5)
eGFR: 73 mL/min/{1.73_m2} (ref >59)

## 2022-07-07 LAB — LIPID PANEL
Chol/HDL Ratio: 3.8 ratio (ref 0.0–4.4)
Cholesterol, Total: 181 mg/dL (ref 100–199)
HDL: 48 mg/dL (ref >39)
LDL Chol Calc (NIH): 113 mg/dL — ABNORMAL HIGH (ref 0–99)
Triglycerides: 108 mg/dL (ref 0–149)
VLDL Cholesterol Cal: 20 mg/dL (ref 5–40)

## 2022-07-07 LAB — VITAMIN B12: Vitamin B-12: 980 pg/mL (ref 232–1245)

## 2022-07-07 LAB — HEMOGLOBIN A1C
Est. average glucose Bld gHb Est-mCnc: 157 mg/dL
Hgb A1c MFr Bld: 7.1 % — ABNORMAL HIGH (ref 4.8–5.6)

## 2022-07-07 LAB — VITAMIN D 25 HYDROXY (VIT D DEFICIENCY, FRACTURES): Vit D, 25-Hydroxy: 52 ng/mL (ref 30.0–100.0)

## 2022-07-07 LAB — TSH: TSH: 0.197 u[IU]/mL — ABNORMAL LOW (ref 0.450–4.500)

## 2022-07-09 ENCOUNTER — Ambulatory Visit
Admission: RE | Admit: 2022-07-09 | Discharge: 2022-07-09 | Disposition: A | Discharge status: Home / Self Care | Payer: Medicare Other | Source: Ambulatory Visit | Attending: Family Medicine | Admitting: Family Medicine

## 2022-07-09 DIAGNOSIS — Z1231 Encounter for screening mammogram for malignant neoplasm of breast: Secondary | ICD-10-CM | POA: Insufficient documentation

## 2022-07-13 ENCOUNTER — Encounter: Payer: Self-pay | Admitting: Family Medicine

## 2022-07-13 ENCOUNTER — Ambulatory Visit (INDEPENDENT_AMBULATORY_CARE_PROVIDER_SITE_OTHER): Payer: Medicare Other | Admitting: Family Medicine

## 2022-07-13 VITALS — BP 134/76 | HR 97 | Temp 97.4°F | Ht 61.5 in | Wt 187.6 lb

## 2022-07-13 DIAGNOSIS — Z0000 Encounter for general adult medical examination without abnormal findings: Secondary | ICD-10-CM

## 2022-07-13 DIAGNOSIS — E2839 Other primary ovarian failure: Secondary | ICD-10-CM | POA: Diagnosis not present

## 2022-07-13 DIAGNOSIS — Z23 Encounter for immunization: Secondary | ICD-10-CM | POA: Diagnosis not present

## 2022-07-13 DIAGNOSIS — K219 Gastro-esophageal reflux disease without esophagitis: Secondary | ICD-10-CM | POA: Diagnosis not present

## 2022-07-13 DIAGNOSIS — I10 Essential (primary) hypertension: Secondary | ICD-10-CM

## 2022-07-13 DIAGNOSIS — E6609 Other obesity due to excess calories: Secondary | ICD-10-CM

## 2022-07-13 DIAGNOSIS — E785 Hyperlipidemia, unspecified: Secondary | ICD-10-CM

## 2022-07-13 DIAGNOSIS — M0609 Rheumatoid arthritis without rheumatoid factor, multiple sites: Secondary | ICD-10-CM

## 2022-07-13 DIAGNOSIS — E039 Hypothyroidism, unspecified: Secondary | ICD-10-CM

## 2022-07-13 DIAGNOSIS — E1169 Type 2 diabetes mellitus with other specified complication: Secondary | ICD-10-CM | POA: Diagnosis not present

## 2022-07-13 DIAGNOSIS — E559 Vitamin D deficiency, unspecified: Secondary | ICD-10-CM

## 2022-07-13 DIAGNOSIS — M8589 Other specified disorders of bone density and structure, multiple sites: Secondary | ICD-10-CM

## 2022-07-13 DIAGNOSIS — E119 Type 2 diabetes mellitus without complications: Secondary | ICD-10-CM

## 2022-07-13 DIAGNOSIS — Z6834 Body mass index (BMI) 34.0-34.9, adult: Secondary | ICD-10-CM

## 2022-07-13 MED ORDER — METFORMIN HCL 1000 MG PO TABS: 1000.0000 mg | ORAL_TABLET | Freq: Two times a day (BID) | ORAL | 3 refills | Status: DC

## 2022-07-13 MED ORDER — LEVOTHYROXINE SODIUM 125 MCG PO TABS: 125.0000 ug | ORAL_TABLET | Freq: Every day | ORAL | 3 refills | Status: DC

## 2022-07-13 MED ORDER — EZETIMIBE 10 MG PO TABS: 10.0000 mg | ORAL_TABLET | Freq: Every day | ORAL | 3 refills | Status: DC

## 2022-07-13 NOTE — Progress Notes (Signed)
Subjective:    Patient ID: Tina Berger, female    DOB: 1951-01-15, 71 y.o.   MRN: [id number]  HPI Here for health maintenance exam and to review chronic medical problems   Wt Readings from Last 3 Encounters:  07/13/22 187 lb 9.6 oz (85.1 kg)  06/10/22 186 lb 12.8 oz (84.7 kg)  05/05/22 188 lb (85.3 kg)   34.87 kg/m    Immunization History  Administered Date(s) Administered   Fluad Quad(high Dose 65+) 08/07/2020   Influenza Split 06/19/2011   Influenza Whole 08/01/2009   Influenza, High Dose Seasonal PF 08/14/2018, 07/03/2019   Influenza,inj,Quad PF,6+ Mos 06/14/2014, 08/25/2015, 07/26/2016, 08/19/2017   Moderna Sars-Covid-2 Vaccination 09/17/2020   PFIZER(Purple Top)SARS-COV-2 Vaccination 11/27/2019, 12/18/2019   Pneumococcal Conjugate-13 08/25/2015   Pneumococcal Polysaccharide-23 08/25/2016   Td 11/25/2003   Tdap 06/14/2014   Zoster, Live 09/16/2014   Health Maintenance Due  Topic Date Due   Zoster Vaccines- Shingrix (1 of 2) Never done   Fecal DNA (Cologuard)  09/04/2019   COVID-19 Vaccine (4 - Mixed Product risk series) 11/12/2020   INFLUENZA VACCINE  05/18/2022   Shingrix: thinks she may have had the first one a few years ago   Mammogram 06/2022 No lumps on self exam Declines breast exam today   Flu shot : today    Cologuard 08/2016 neg -declines another  Colonoscopy 2011 no recall   Eye exam 02/2022  Dexa 10/2018 stable osteopenia -now on prednisone  Wants to schedule  Falls- none  Fractures-none  Supplements - taking vit D  D level of 52 Exercise - cannot do a lot due to RA, she does walk as much as she can   HTN  bp is stable today  No cp or palpitations or headaches or edema  No side effects to medicines  BP Readings from Last 3 Encounters:  07/13/22 134/76  06/10/22 129/83  03/29/22 (!) 153/86     Metoprolol xl 50 mg bid  Ramipril 5 mg daily   GERD - pepcid bid 40 mg  It is controlled  Lab Results  Component Value Date    VITAMINB12 980 07/06/2022    RA : sees Dr Garen Grams  Thinks she has not been able to handle - has not found anything to work  [employer] to pain clinc early oct -may get a recommendation     DM2 Lab Results  Component Value Date   HGBA1C 7.1 (H) 07/06/2022   Last time was 6.6  Metformin 500 mg bid   Diet is fair  Lives on protein shakes so this is the best  Cannot eat normally    Lab Results  Component Value Date   LABMICR 14.3 07/06/2022   LABMICR See below: 09/29/2021   Lab Results  Component Value Date   CREATININE 0.85 07/06/2022   BUN 18 07/06/2022   NA 140 07/06/2022   K 5.3 (H) 07/06/2022   CL 98 07/06/2022   CO2 26 07/06/2022  On altace- ? Raising K      Hyperlipidemia   Lab Results  Component Value Date   CHOL 181 07/06/2022   CHOL 192 03/23/2022   CHOL 168 08/20/2020   Lab Results  Component Value Date   HDL 48 07/06/2022   HDL 64.60 03/23/2022   HDL 42.30 08/20/2020   Lab Results  Component Value Date   LDLCALC 113 (H) 07/06/2022   LDLCALC 88 03/23/2022   LDLCALC 99 08/20/2020   Lab Results  Component Value Date  TRIG 108 07/06/2022   TRIG 197.0 (H) 03/23/2022   TRIG 133.0 08/20/2020   Lab Results  Component Value Date   CHOLHDL 3.8 07/06/2022   CHOLHDL 3 03/23/2022   CHOLHDL 4 08/20/2020   Lab Results  Component Value Date   LDLDIRECT 149.0 07/26/2016   LDLDIRECT 136.0 08/25/2015   LDLDIRECT 135.6 10/08/2011   Crestor 5 mg twice weekly  Open to zetia    Hypothyroidism Lab Results  Component Value Date   TSH 0.197 (L) 07/06/2022   Levothyroxine 150 mcg daily     Lab Results  Component Value Date   WBC 9.1 07/06/2022   HGB 13.4 07/06/2022   HCT 40.0 07/06/2022   MCV 88 07/06/2022   PLT 319 07/06/2022   Patient Active Problem List   Diagnosis Date Noted   Dysuria 09/07/2021   Rheumatoid arthritis ([place]) 05/27/2021   Chronic venous insufficiency 05/23/2021   AVM (arteriovenous malformation) 04/04/2021   CAD  (coronary artery disease) 01/22/2021   Primary osteoarthritis of both knees 01/12/2021   Positive ANA (antinuclear antibody) 11/17/2020   Joint pain 11/13/2020   Current use of proton pump inhibitor 08/26/2020   Estrogen deficiency 08/21/2018   TMJ (dislocation of temporomandibular joint), initial encounter 05/05/2018   Anterior neck pain 05/05/2018   Chronic pain syndrome 11/09/2017   Chronic upper extremity pain (Tertiary Area of Pain) (Bilateral) (L>R) 11/09/2017   Fibromyalgia syndrome 11/09/2017   Osteoarthritis 11/09/2017   Osteoarthritis of lumbar facet joint (Bilateral) 11/09/2017   Lumbar facet arthropathy (Bilateral) 11/09/2017   Lumbar facet syndrome (Bilateral) (L>R) 11/09/2017   Lumbar foraminal stenosis (multilevel) (Bilateral) 11/09/2017   DDD (degenerative disc disease), lumbar 11/09/2017   Thoracic levoscoliosis 11/09/2017   Thoracic facet syndrome (Bilateral) (L>R) 11/09/2017   Thoracic facet arthropathy (Bilateral) (R>L) 11/09/2017   DDD (degenerative disc disease), thoracic 11/09/2017   Thoracolumbar IVDD 11/09/2017   DDD (degenerative disc disease), cervical 11/09/2017   Osteoarthritis of cervical facet (Bilateral) 11/09/2017   Grade 1 Anterolisthesis of C7 over T1 11/09/2017   Cervical foraminal stenosis (Bilateral) 11/09/2017   History of fusion of cervical spine (C5-6 ACDF) 11/09/2017   Cervical facet syndrome (Bilateral) 11/09/2017   Disorder of skeletal system 11/09/2017   Cervical radiculitis (Bilateral) 11/09/2017   Lumbar Epidural lipomatosis 11/09/2017   Chronic sacroiliac joint pain (Bilateral) (L>R) 11/09/2017   Chronic hip pain (Bilateral) (L>R) 11/09/2017   Chronic upper back pain (Primary Area of Pain) (midline) 10/24/2017   Chronic neck pain (Secondary Area of Pain) (Bilateral)  (L>R) 10/24/2017   Chronic low back pain (Fourth Area of Pain) (Bilateral) (L>R) 10/24/2017   Chronic lower extremity pain (Fifth Area of Pain) (Bilateral) (L>R)  10/24/2017   Lumbar Grade 1 Retrolisthesis of L1-2 and L2-3 10/24/2017   Other long term (current) drug therapy 10/24/2017   Other specified health status 10/24/2017   Long term current use of opiate analgesic 10/24/2017   Long term prescription opiate use 10/24/2017   Opiate use 10/24/2017   DM type 2 (diabetes mellitus, type 2) ([place]) 06/14/2014   Pharmacologic therapy 06/14/2014   Pedal edema    Post-menopausal 06/23/2012   Lumbar disc disease with radiculopathy 11/18/2011   HTN (hypertension) 07/30/2011   Raynaud disease 07/30/2011   Obesity 07/30/2011   Routine general medical examination at a health care facility 03/28/2011   Palpitations 04/27/2010   Depression with anxiety 06/14/2008   Vitamin D deficiency 05/13/2008   ANXIETY 03/25/2008   Osteopenia 03/25/2008   Hypothyroidism 03/28/2007   Hyperlipidemia  associated with type 2 diabetes mellitus ([place]) 03/28/2007   Other iron deficiency anemias 03/28/2007   PANIC ATTACK 03/28/2007   KERATOCONJUNCTIVITIS SICCA 03/28/2007   Mitral valve disorder 03/28/2007   ABNORMAL HEART RHYTHMS 03/28/2007   Raynaud's syndrome 03/28/2007   GERD 03/28/2007   IBS 03/28/2007   Rosacea 03/28/2007   PLANTAR FASCIITIS 03/28/2007   MIGRAINES, HX OF 03/28/2007   Past Medical History:  Diagnosis Date   Acute renal insufficiency 05/31/2014   pt not aware of this diagnosis   Anemia, iron deficiency    Anxiety    Bilateral lower extremity edema    a. uses torsemide   Cervical dysplasia    abnormal paps   Colon cancer screening 06/14/2014   Coronary artery disease    Cough 08/08/2012   Degenerative disk disease 11/19/2011   Depression    Diabetes mellitus type II    Diverticulosis    DJD (degenerative joint disease)    Drug rash 05/22/2011   Dry eyes    Dysrhythmia    metoprolol.   Edema    Elevated liver enzymes 03/21/2012   Encounter for routine gynecological examination 06/14/2014   ESOPHAGITIS 03/28/2007   Qualifier:  Hospitalized for  By: Marcelino Scot CMA, [place]     Fibromyalgia    GASTRITIS 03/28/2007   Qualifier: History of  By: Marcelino Scot CMA, [place] Bil[place]     GERD (gastroesophageal reflux disease)    Hemorrhoids    external   HLD (hyperlipidemia)    HNP (herniated nucleus pulposus) 11/1997   T6,7,8 with DJD   HTN (hypertension)    Hyperglycemia 05/13/2008   Qualifier: Diagnosis of  By: Glori Bickers MD, Carmell Austria    Hypothyroidism    Interstitial lung disease ([place])    pt not aware of this   Left ovarian cyst    x 3, rupture   Osteoarthritis    hands   Osteopenia    mild-11/01; improved 12/05   Other screening mammogram 08/18/2011   Palpitations    PERSONAL HISTORY ALLERGY UNSPEC MEDICINAL AGENT 03/18/2010   Qualifier: Diagnosis of  By: Glori Bickers MD, Carmell Austria    PONV (postoperative nausea and vomiting)    Raynaud's disease    Recurrent HSV (herpes simplex virus)    lesions in nose or mouth with frequent ulcers. d/t meds and dry mouth   Rheumatoid arthritis ([place])    Rhinitis 03/21/2012   Routine general medical examination at a health care facility 03/28/2011   Past Surgical History:  Procedure Laterality Date   ABDOMINAL EXPLORATION SURGERY     ACDF  1991   BIOPSY OF SKIN SUBCUTANEOUS TISSUE AND/OR MUCOUS MEMBRANE Left 04/08/2021   Procedure: BIOPSY OF SKIN SUBCUTANEOUS TISSUE AND/OR MUCOUS MEMBRANE ( REMOVAL SKIN NODULE);  Surgeon: Katha Cabal, MD;  Location: [facility] ORS;  Service: Vascular;  Laterality: Left;   COLONOSCOPY  08/2000   Diverticulosis; hemorrhoids   HAND SURGERY Left    left thumb. PIN REMOVED   LASIK     bilateral   Social History   Tobacco Use   Smoking status: Former    Packs/day: 1.00    Years: 5.00    Total pack years: 5.00    Types: Cigarettes    Quit date: 10/18/1968    Years since quitting: 53.7    Passive exposure: Never   Smokeless tobacco: Never   Tobacco comments:    quit over 40 years  Vaping Use   Vaping Use: Never used  Substance Use Topics  Alcohol use: No    Alcohol/week: 0.0 standard drinks of alcohol   Drug use: No   Family History  Problem Relation Age of Onset   Lung cancer Mother        + smoker   Coronary artery disease Mother        relatively young   Emphysema Father        + smoker   Lymphoma Sister    Heart disease Brother    Lymphoma Brother    Parkinson's disease Brother    Anemia Brother        aplastic    Diabetes Brother    Iron deficiency Daughter    Leukemia Son    Breast cancer Neg Hx    Allergies  Allergen Reactions   Ceftin [Cefuroxime] Swelling    Swelling, "legs turn blue".  Legs swelling, then blue, then a rash   Clonidine Derivatives     Swelling of lips   Erythromycin Swelling    Rash, swollen gums    Keflex [Cephalexin] Anaphylaxis    Chest was broken out in a rash. Lips were swelling. Took a few days to occur, but it kept getting worse.                                                                                                                                                                                                   Penicillins Anaphylaxis    Pt had a daughter with an allergy to this at a young age. Patient developed blisters anywhere her child touched her. Also, if she used the bathroom after daughter did while on keflex, it would cause a reaction for her. Lips and roof of mouth swelled up also.   Sulfonamide Derivatives Swelling    Rash, swollen gums, lips   Ciprofloxacin     REACTION: ? rash vs sun rxn. Rash    Fluoxetine Hcl     REACTION: stomach problems. Severe pain in abdomen. Could not function d/t pain   Furosemide Swelling    REACTION: swelling worsened in legs once taking   Gabapentin Other (See Comments)    REACTION: edema of feet. Unable to get shoes on   Hydroxychloroquine Itching    Pt report itching felt like "pre-anaphylaxis" she has had with other medications   Paroxetine     REACTION: weight gain (30 pound weight gain   Pregabalin     REACTION:  swelling of feet and legs. Unable to get shoes on.   Tetracycline     REACTION:inflammed genitals.    Venlafaxine     REACTION:  sweating profusely   Etodolac     REACTION: reaction not known   Macrobid [Nitrofurantoin]     REACTION: ? Lung problem    Amitriptyline Hcl     REACTION: sedating   Atorvastatin     REACTION: muscle twitch and pain   Benicar [Olmesartan Medoxomil]     Muscle pain    Cephalexin Hives and Rash   Cetirizine Hcl     REACTION: headache   Diovan [Valsartan]     Thought it made her feel confused   Naproxen Sodium Other (See Comments)    REACTION: edema of feet and legs.  Not good for ckd   Current Outpatient Medications on File Prior to Visit  Medication Sig Dispense Refill   Acetaminophen (TYLENOL ARTHRITIS PAIN PO) Take 650 mg by mouth daily.     buPROPion (WELLBUTRIN XL) 150 MG 24 hr tablet TAKE 1 TABLET BY MOUTH DAILY 90 tablet 3   Chlorphen-Phenyleph-ASA (ALKA-SELTZER PLUS COLD PO) Take by mouth as needed. Just for colds     cholecalciferol (VITAMIN D3) 25 MCG (1000 UNIT) tablet Take 1,000 Units by mouth daily.     cyclobenzaprine (FLEXERIL) 5 MG tablet Take 5 mg by mouth daily as needed.     Diclofenac Sodium 1.5 % SOLN Place 2 mLs onto the skin 4 (four) times daily. 3 Bottle 3   esomeprazole (NEXIUM) 20 MG capsule TAKE 1 CAPSULE BY MOUTH DAILY 90 capsule 3   famotidine (PEPCID) 40 MG tablet TAKE 1 TABLET BY MOUTH DAILY 90 tablet 3   ferrous sulfate 325 (65 FE) MG EC tablet Take 325 mg by mouth daily with breakfast.     FLUAD QUADRIVALENT 0.5 ML injection      glucose blood test strip One Touch Ultra stripts blue-To check sugar once daily and as needed for DM2 250.00 100 each 3   GUAIFENESIN CR PO Take by mouth as needed.     HYDROmorphone HCl (EXALGO) 12 MG T24A SR tablet Take 12 mg by mouth 2 (two) times daily.     hyoscyamine (LEVSIN SL) 0.125 MG SL tablet TAKE 1 TABLET BY MOUTH EVERY 4 HOURS AS NEEDED FOR CRAMPING. 270 tablet 1   LYSINE PO Take by  mouth as needed. OTC for ulcer prophylaxis     Magnesium 400 MG CAPS Take by mouth daily.     metoprolol succinate (TOPROL-XL) 50 MG 24 hr tablet TAKE 1 TABLET BY MOUTH TWICE  DAILY 180 tablet 3   naloxone (NARCAN) nasal spray 4 mg/0.1 mL SMARTSIG:Both Nares     ondansetron (ZOFRAN) 4 MG tablet Take 1 tablet (4 mg total) by mouth every 8 (eight) hours as needed for nausea or vomiting. 20 tablet 0   oxyCODONE (OXYCONTIN) 10 MG 12 hr tablet Take 10 mg by mouth 3 (three) times daily as needed. Beakthrough pain     predniSONE (DELTASONE) 1 MG tablet TAKE 2 TABLETS BY MOUTH ONCE WITH BREAKFAST , ALONG WITH '5MG'$  TO EQUAL '7MG'$  DAILY 60 tablet 1   predniSONE (DELTASONE) 5 MG tablet TAKE 1 TABLET BY MOUTH EVERY DAY 30 tablet 2   ramipril (ALTACE) 5 MG capsule TAKE 1 CAPSULE BY MOUTH DAILY 90 capsule 3   rosuvastatin (CRESTOR) 5 MG tablet TAKE 1 TABLET BY MOUTH TWICE  WEEKLY 26 tablet 2   Tocilizumab (ACTEMRA ACTPEN) 162 MG/0.9ML SOAJ Inject 162 mg into the skin every 14 (fourteen) days. 5.4 mL 0   ketoconazole (NIZORAL) 2 % shampoo SHAMPOO WITH A SMALL AMOUNT AS  DIRECTED THREE TIMES A WEEK SHAMPOO SCALP 3 DAYS A WEEK, LET SHAMPOO SIT FOR 10 MINUTES AND RINSE OFF (Patient not taking: Reported on 06/10/2022) 120 mL 2   No current facility-administered medications on file prior to visit.    Review of Systems  Constitutional:  Positive for fatigue. Negative for activity change, appetite change, fever and unexpected weight change.  HENT:  Negative for congestion, ear pain, rhinorrhea, sinus pressure and sore throat.   Eyes:  Negative for pain, redness and visual disturbance.  Respiratory:  Negative for cough, shortness of breath and wheezing.   Cardiovascular:  Negative for chest pain and palpitations.  Gastrointestinal:  Negative for abdominal pain, blood in stool, constipation and diarrhea.       Dyspepsia   Endocrine: Negative for polydipsia and polyuria.  Genitourinary:  Negative for dysuria, frequency  and urgency.  Musculoskeletal:  Positive for arthralgias. Negative for back pain and myalgias.  Skin:  Negative for pallor and rash.  Allergic/Immunologic: Negative for environmental allergies.  Neurological:  Negative for dizziness, syncope and headaches.  Hematological:  Negative for adenopathy. Does not bruise/bleed easily.  Psychiatric/Behavioral:  Negative for decreased concentration and dysphoric mood. The patient is nervous/anxious.        Objective:   Physical Exam Constitutional:      General: She is not in acute distress.    Appearance: Normal appearance. She is well-developed. She is obese. She is not ill-appearing or diaphoretic.  HENT:     Head: Normocephalic and atraumatic.     Right Ear: Tympanic membrane, ear canal and external ear normal.     Left Ear: Tympanic membrane, ear canal and external ear normal.     Nose: Nose normal. No congestion.     Mouth/Throat:     Mouth: Mucous membranes are moist.     Pharynx: Oropharynx is clear. No posterior oropharyngeal erythema.  Eyes:     General: No scleral icterus.    Extraocular Movements: Extraocular movements intact.     Conjunctiva/sclera: Conjunctivae normal.     Pupils: Pupils are equal, round, and reactive to light.  Neck:     Thyroid: No thyromegaly.     Vascular: No carotid bruit or JVD.  Cardiovascular:     Rate and Rhythm: Normal rate and regular rhythm.     Pulses: Normal pulses.     Heart sounds: Normal heart sounds.     No gallop.  Pulmonary:     Effort: Pulmonary effort is normal. No respiratory distress.     Breath sounds: Normal breath sounds. No wheezing.     Comments: Good air exch Chest:     Chest wall: No tenderness.  Abdominal:     General: Bowel sounds are normal. There is no distension or abdominal bruit.     Palpations: Abdomen is soft. There is no mass.     Tenderness: There is no abdominal tenderness.     Hernia: No hernia is present.  Genitourinary:    Comments: Breast exam: No  mass, nodules, thickening, tenderness, bulging, retraction, inflamation, nipple discharge or skin changes noted.  No axillary or clavicular LA.     Musculoskeletal:        General: No tenderness. Normal range of motion.     Cervical back: Normal range of motion and neck supple. No rigidity. No muscular tenderness.     Right lower leg: No edema.     Left lower leg: No edema.     Comments: Mild kyphosis   Lymphadenopathy:  Cervical: No cervical adenopathy.  Skin:    General: Skin is warm and dry.     Coloration: Skin is not pale.     Findings: No erythema or rash.     Comments: Fair   Some rubor of fingers/feet due to raynaud's   Neurological:     Mental Status: She is alert. Mental status is at baseline.     Cranial Nerves: No cranial nerve deficit.     Motor: No abnormal muscle tone.     Coordination: Coordination normal.     Gait: Gait normal.     Deep Tendon Reflexes: Reflexes are normal and symmetric. Reflexes normal.  Psychiatric:        Mood and Affect: Mood normal.        Cognition and Memory: Cognition and memory normal.           Assessment & Plan:   Problem List Items Addressed This Visit       Cardiovascular and Mediastinum   HTN (hypertension)    bp in fair control at this time  BP Readings from Last 1 Encounters:  07/13/22 134/76  No changes needed Most recent labs reviewed  Disc lifstyle change with low sodium diet and exercise  Plan to continue metoprolol xl 50 mg bid Ramipril 5 mg daily       Relevant Medications   ezetimibe (ZETIA) 10 MG tablet     Digestive   GERD    Takes pepcid 40 mg bid   B12 level is tx        Endocrine   DM type 2 (diabetes mellitus, type 2) (HCC)    Lab Results  Component Value Date   HGBA1C 7.1 (H) 07/06/2022  still taking low dose prednisone for RA This is up from 6.6  Fair diet - cannot eat well due to GI intolerances microalb utd  No changes on foot exam Eye exam utd On ace and statin  Plan to inc  metformin to 1000 mg bid and f/u in 3 mo  Will update if side eff      Relevant Medications   metFORMIN (GLUCOPHAGE) 1000 MG tablet   Hyperlipidemia associated with type 2 diabetes mellitus (HCC)    Disc goals for lipids and reasons to control them Rev last labs with pt Rev low sat fat diet in detail   crestor 5 mg twice weekly is all she can tolerate Will try adding zetia to get LDL to goal of below 70 She cannot change diet any more       Relevant Medications   metFORMIN (GLUCOPHAGE) 1000 MG tablet   ezetimibe (ZETIA) 10 MG tablet   Hypothyroidism    Hypothyroidism  Pt has no clinical changes No change in energy level/ hair or skin/ edema and no tremor Lab Results  Component Value Date   TSH 0.197 (L) 07/06/2022  will reduce dose of levothyroxine to 125 mcg daily and re check in 3 mo        Relevant Medications   levothyroxine (SYNTHROID) 125 MCG tablet     Musculoskeletal and Integument   Osteopenia    dexa ordered-pt will call  D level is tx  Limited exercise with RA No falls or fx       Rheumatoid arthritis ([place])    Under care of Dr Garen Grams  Interested in 2nd opinion since she does not tolerate tx so far and still on prednisone         Other  Estrogen deficiency   Relevant Orders   DG Bone Density   Obesity    bmi 34.8 On chronic prednisone for RA  Exercise is not tolerated well  Tricky diet due to intolerances        Relevant Medications   metFORMIN (GLUCOPHAGE) 1000 MG tablet   Routine general medical examination at a health care facility - Primary    Reviewed health habits including diet and exercise and skin cancer prevention Reviewed appropriate screening tests for age  Also reviewed health mt list, fam hx and immunization status , as well as social and family history   See HPI Labs reviewed  Need to send for shingrix dates  Flu shot given  Mammogram utd 06/2022 Declines further colon cancer screen (cologuard 2017 neg) Eye exam  utd dexa ordered, no falls or fractures  D level is tx  Enc exercise as tolerated      Vitamin D deficiency    Level tx at 48 with current supplementation   Vitamin D level is therapeutic with current supplementation Disc importance of this to bone and overall health       Other Visit Diagnoses     Need for immunization against influenza       Relevant Orders   Flu Vaccine QUAD High Dose(Fluad) (Completed)

## 2022-07-13 NOTE — Patient Instructions (Addendum)
If you are interested in the new shingles vaccine (Shingrix) - call your local pharmacy to check on coverage and availability  If affordable, get on a wait list at your pharmacy to get the vaccine.  Flu shot today   Please go up on metformin to 1000 mg twice daily   For cholesterol control look at the info on zetia 10 mg  If you want to fill it get started on it   Go down on levothyroxine 125 mcg daily  We will check it next time    Please call and schedule your bone density test at the [facility] breast center  Please call the location of your choice from the menu below to schedule your Mammogram and/or Bone Density appointment.    [facility]                      Phone:  [phone], [place] [address]                                                             Services: Traditional and 3D Mammogram, [place] [facility] Density                 Phone: [phone] [address], [place] [address]    Service: Bone Density ONLY   *this site does NOT perform mammograms  [place]                        Phone:  [phone] [address], [place] [address]                                            Services:  3D Mammogram and [place] at [facility]   Phone:  [phone]   [address] [place], [place] [address]  Services: 3D Mammogram and Bone Density  [place] at [facility])  Phone:  [phone]   [address] [place], [place] [address]                                               Services:  3D Mammogram and Bone Density

## 2022-07-18 NOTE — Assessment & Plan Note (Signed)
Hypothyroidism  Pt has no clinical changes No change in energy level/ hair or skin/ edema and no tremor Lab Results  Component Value Date   TSH 0.197 (L) 07/06/2022  will reduce dose of levothyroxine to 125 mcg daily and re check in 3 mo

## 2022-07-18 NOTE — Assessment & Plan Note (Signed)
Level tx at 70 with current supplementation   Vitamin D level is therapeutic with current supplementation Disc importance of this to bone and overall health

## 2022-07-18 NOTE — Assessment & Plan Note (Signed)
Reviewed health habits including diet and exercise and skin cancer prevention Reviewed appropriate screening tests for age  Also reviewed health mt list, fam hx and immunization status , as well as social and family history   See HPI Labs reviewed  Need to send for shingrix dates  Flu shot given  Mammogram utd 06/2022 Declines further colon cancer screen (cologuard 2017 neg) Eye exam utd dexa ordered, no falls or fractures  D level is tx  Enc exercise as tolerated

## 2022-07-18 NOTE — Assessment & Plan Note (Signed)
bp in fair control at this time  BP Readings from Last 1 Encounters:  07/13/22 134/76   No changes needed Most recent labs reviewed  Disc lifstyle change with low sodium diet and exercise  Plan to continue metoprolol xl 50 mg bid Ramipril 5 mg daily

## 2022-07-18 NOTE — Assessment & Plan Note (Signed)
Takes pepcid 40 mg bid   B12 level is tx

## 2022-07-18 NOTE — Assessment & Plan Note (Signed)
Disc goals for lipids and reasons to control them Rev last labs with pt Rev low sat fat diet in detail   crestor 5 mg twice weekly is all she can tolerate Will try adding zetia to get LDL to goal of below 70 She cannot change diet any more

## 2022-07-18 NOTE — Assessment & Plan Note (Addendum)
Lab Results  Component Value Date   HGBA1C 7.1 (H) 07/06/2022  still taking low dose prednisone for RA This is up from 6.6  Fair diet - cannot eat well due to GI intolerances microalb utd  No changes on foot exam Eye exam utd On ace and statin  Plan to inc metformin to 1000 mg bid and f/u in 3 mo  Will update if side eff

## 2022-07-18 NOTE — Assessment & Plan Note (Signed)
bmi 34.8 On chronic prednisone for RA  Exercise is not tolerated well  Tricky diet due to intolerances

## 2022-07-18 NOTE — Assessment & Plan Note (Signed)
Under care of Dr Garen Grams  Interested in 2nd opinion since she does not tolerate tx so far and still on prednisone

## 2022-07-18 NOTE — Assessment & Plan Note (Signed)
dexa ordered-pt will call  D level is tx  Limited exercise with RA No falls or fx

## 2022-07-22 DIAGNOSIS — M5137 Other intervertebral disc degeneration, lumbosacral region: Secondary | ICD-10-CM | POA: Diagnosis not present

## 2022-07-22 DIAGNOSIS — Z79899 Other long term (current) drug therapy: Secondary | ICD-10-CM | POA: Diagnosis not present

## 2022-07-22 DIAGNOSIS — M961 Postlaminectomy syndrome, not elsewhere classified: Secondary | ICD-10-CM | POA: Diagnosis not present

## 2022-07-22 DIAGNOSIS — G894 Chronic pain syndrome: Secondary | ICD-10-CM | POA: Diagnosis not present

## 2022-07-22 DIAGNOSIS — Z79891 Long term (current) use of opiate analgesic: Secondary | ICD-10-CM | POA: Diagnosis not present

## 2022-07-22 DIAGNOSIS — K5903 Drug induced constipation: Secondary | ICD-10-CM | POA: Diagnosis not present

## 2022-07-22 DIAGNOSIS — M4725 Other spondylosis with radiculopathy, thoracolumbar region: Secondary | ICD-10-CM | POA: Diagnosis not present

## 2022-07-22 DIAGNOSIS — M5134 Other intervertebral disc degeneration, thoracic region: Secondary | ICD-10-CM | POA: Diagnosis not present

## 2022-07-29 ENCOUNTER — Other Ambulatory Visit: Payer: Self-pay | Admitting: Physician Assistant

## 2022-07-29 NOTE — Telephone Encounter (Signed)
Next Visit: 09/16/2022  Last Visit: 06/10/2022  Last Fill: 06/04/2022  Dx:  Rheumatoid arthritis of multiple sites with negative rheumatoid factor   Current Dose per office note on 06/10/2022: Prednisone 7 mg daily  Okay to refill Prednisone?

## 2022-08-11 MED ORDER — PREDNISONE 5 MG PO TABS: 5.0000 mg | ORAL_TABLET | Freq: Every day | ORAL | 2 refills | Status: DC

## 2022-08-11 NOTE — Telephone Encounter (Signed)
Next Visit: 09/16/2022   Last Visit: 06/10/2022   Last Fill: 04/12/2022   Dx:  Rheumatoid arthritis of multiple sites with negative rheumatoid factor    Current Dose per office note on 06/10/2022: Prednisone 7 mg daily   Okay to refill Prednisone?

## 2022-08-16 ENCOUNTER — Encounter (INDEPENDENT_AMBULATORY_CARE_PROVIDER_SITE_OTHER): Payer: Self-pay

## 2022-08-18 ENCOUNTER — Telehealth: Payer: Self-pay

## 2022-08-18 NOTE — Chronic Care Management (AMB) (Signed)
Chronic Care Management Pharmacy Assistant   Name: KINSEY KARCH  MRN: [id number] DOB: June 02, 1951  Reason for Encounter: General Adherence  Recent office visits:  07/13/22-Marne Tower,MD(PCP)- [facility],discussed vaccines, screenings, flu shot given,still taking low dose prednisone for RA , new A1c 7.1,Plan to inc metformin to 1000 mg bid and f/u in 3 mo Will try adding zetia to get LDL to goal of below 70 She cannot change diet any more ,will reduce dose of levothyroxine to 125 mcg daily and re check in 3 mo ,dexa ordered, schedule mammogram.   Recent consult visits:  06/10/22-Taylor Dale,PA(rheu)-f/u RA, labs (Creatinine is borderline elevated-1.05 and GFR is borderline low-56. Glucose is elevated-118.  Rest of CMP WNL.  Recommend avoiding the use of NSAIDs.) plaquenil caused rash,remains on prednisone '7mg'$ , f/u 3 months   05/14/22-Adam McDonald,DPM(podiatry)- f/u diabetic shoes, inserts.  Hospital visits:  None in previous 6 months  Medications: Outpatient Encounter Medications as of 08/18/2022  Medication Sig   Acetaminophen (TYLENOL ARTHRITIS PAIN PO) Take 650 mg by mouth daily.   buPROPion (WELLBUTRIN XL) 150 MG 24 hr tablet TAKE 1 TABLET BY MOUTH DAILY   Chlorphen-Phenyleph-ASA (ALKA-SELTZER PLUS COLD PO) Take by mouth as needed. Just for colds   cholecalciferol (VITAMIN D3) 25 MCG (1000 UNIT) tablet Take 1,000 Units by mouth daily.   cyclobenzaprine (FLEXERIL) 5 MG tablet Take 5 mg by mouth daily as needed.   Diclofenac Sodium 1.5 % SOLN Place 2 mLs onto the skin 4 (four) times daily.   esomeprazole (NEXIUM) 20 MG capsule TAKE 1 CAPSULE BY MOUTH DAILY   ezetimibe (ZETIA) 10 MG tablet Take 1 tablet (10 mg total) by mouth daily.   famotidine (PEPCID) 40 MG tablet TAKE 1 TABLET BY MOUTH DAILY   ferrous sulfate 325 (65 FE) MG EC tablet Take 325 mg by mouth daily with breakfast.   FLUAD QUADRIVALENT 0.5 ML injection    glucose blood test strip One Touch Ultra stripts blue-To check sugar  once daily and as needed for DM2 250.00   GUAIFENESIN CR PO Take by mouth as needed.   HYDROmorphone HCl (EXALGO) 12 MG T24A SR tablet Take 12 mg by mouth 2 (two) times daily.   hyoscyamine (LEVSIN SL) 0.125 MG SL tablet TAKE 1 TABLET BY MOUTH EVERY 4 HOURS AS NEEDED FOR CRAMPING.   ketoconazole (NIZORAL) 2 % shampoo SHAMPOO WITH A SMALL AMOUNT AS DIRECTED THREE TIMES A WEEK SHAMPOO SCALP 3 DAYS A WEEK, LET SHAMPOO SIT FOR 10 MINUTES AND RINSE OFF (Patient not taking: Reported on 06/10/2022)   levothyroxine (SYNTHROID) 125 MCG tablet Take 1 tablet (125 mcg total) by mouth daily.   LYSINE PO Take by mouth as needed. OTC for ulcer prophylaxis   Magnesium 400 MG CAPS Take by mouth daily.   metFORMIN (GLUCOPHAGE) 1000 MG tablet Take 1 tablet (1,000 mg total) by mouth 2 (two) times daily with a meal.   metoprolol succinate (TOPROL-XL) 50 MG 24 hr tablet TAKE 1 TABLET BY MOUTH TWICE  DAILY   naloxone (NARCAN) nasal spray 4 mg/0.1 mL SMARTSIG:Both Nares   ondansetron (ZOFRAN) 4 MG tablet Take 1 tablet (4 mg total) by mouth every 8 (eight) hours as needed for nausea or vomiting.   oxyCODONE (OXYCONTIN) 10 MG 12 hr tablet Take 10 mg by mouth 3 (three) times daily as needed. Beakthrough pain   predniSONE (DELTASONE) 1 MG tablet TAKE 2 TABLETS BY MOUTH ONCE WITH BREAKFAST , ALONG WITH '5MG'$  TO EQUAL '7MG'$  DAILY  predniSONE (DELTASONE) 5 MG tablet Take 1 tablet (5 mg total) by mouth daily.   ramipril (ALTACE) 5 MG capsule TAKE 1 CAPSULE BY MOUTH DAILY   rosuvastatin (CRESTOR) 5 MG tablet TAKE 1 TABLET BY MOUTH TWICE  WEEKLY   Tocilizumab (ACTEMRA ACTPEN) 162 MG/0.9ML SOAJ Inject 162 mg into the skin every 14 (fourteen) days.   No facility-administered encounter medications on file as of 08/18/2022.     [place] on 09/01/22 for general disease state and medication adherence call.   Patient is not more than 5 days past due for refill on the following medications per chart history:  [facility]  Medications: Medication Name/mg Last Fill Days Supply Metformin '1000mg'$   07/13/22 100 Ramipril '5mg'$    07/30/22 100 Rosuvastatin '5mg'$   07/14/22 100    What concerns do you have about your medications? Started back on cymbalta recently and no concerns   The patient denies side effects with their medications.   How often do you forget or accidentally miss a dose? Never  Do you use a pillbox? Yes  Are you having any problems getting your medications from your pharmacy? No  Has the cost of your medications been a concern? No  Since last visit with CPP, the following interventions have been made. DM foot and eye exam UTD, increased Metformin to '1000mg'$  twice daily. Patient did not start Zetia for HLD. Patient saw rheumatology and has started back on cymbalta for bilateral shoulder pain , feet pain, she will f/u end of November.  The patient has not had an ED visit since last contact.   The patient reports the following problems with their health. Unable to get fibromyalgia pain under control, frustrated with taking prednisone daily. Patient will follow up with rheumatology  Patient denies concerns or questions for Charlene Brooke, PharmD at this time.   Counseled patient on:  [place] job taking medications, Importance of taking medication daily without missed doses, Benefits of adherence packaging or a pillbox, and Access to CCM team for any cost, medication or pharmacy concerns.   Care Gaps: Annual wellness visit in last year? Yes Most Recent BP reading:134/76  07/13/22  If Diabetic: Most recent A1C reading: 7.1 07/06/22 Last eye exam / retinopathy screening: UTD Last diabetic foot exam: UTD  Summary of recommendations from last [place] visit (Date:04/27/22) Summary: CCM F/U visit -Reviewed medications; pt stopped taking hydroxychloroquine due to itching/concern for allergic reaction, she has not informed rheumatologist -Reviewed risks of chronic prednisone use    Recommendations/Changes made from today's visit: -Advised to follow up with rheumatology for next options for RA  Upcoming appointments: PCP appointment on 10/13/22 and CCM appointment on 10/27/2022  Charlene Brooke, CPP notified  Avel Sensor, [place]  [phone]

## 2022-08-23 ENCOUNTER — Encounter: Payer: Self-pay | Admitting: Family Medicine

## 2022-08-24 ENCOUNTER — Other Ambulatory Visit: Payer: Self-pay | Admitting: Family Medicine

## 2022-08-24 MED ORDER — CYMBALTA 30 MG PO CPEP: 30.0000 mg | ORAL_CAPSULE | Freq: Every day | ORAL | 1 refills | Status: DC

## 2022-08-25 NOTE — Telephone Encounter (Signed)
Pt didn't realized they don't cover the name brand pt said there was no reason why she asked for name brand except that she wanted the name brand. Pt is okay getting generic Cymbalta sent in

## 2022-08-25 NOTE — Telephone Encounter (Signed)
Please ask the pt- she is the one who req DAW Ask if she paid out of pocket when she had before?  If we need to do prior auth what is the reason for needing DAW? Thanks

## 2022-08-25 NOTE — Telephone Encounter (Signed)
See pharmacies note. It says insurance will not cover the brand name without a PA.  ? If you want to send in generic that's covered or have Tina Berger try and start PA?

## 2022-09-02 NOTE — Progress Notes (Unsigned)
Office Visit Note  Patient: Tina Berger             Date of Birth: 09/17/1951           MRN: [id number]             PCP: Abner Greenspan, MD Referring: Tower, [name], MD Visit Date: 09/16/2022 Occupation: '@GUAROCC'$ @  Subjective:  Pain in multiple joints   History of Present Illness: Tina Berger is a 71 y.o. female with history of seronegative rheumatoid arthritis, ILD, and DDD.  She remains on Actemra 162 mg subcutaneous injections every 14 days and Prednisone 10 mg daily.  Patient presents today with ongoing joint pain and stiffness involving multiple joints.  She continues to experience recurrent and severe flares.  Her pain has been most severe in both wrists, both hands, and both knee joints recently.  She is not having difficulty getting out of a seated position due to severity of pain in her hands and knees.  She has been using a cane to help assist with ambulation.  She has noticed increased inflammation and warmth in both knees.  She has been trying to wear arthritis compression gloves to alleviate her hand pain.  She restarted on Cymbalta 30 mg 1 capsule daily on 08/25/2022.  She has been tolerating Cymbalta without any side effects but has not yet increased the dose to 60 mg which she was previously taking.  She has been following up with pain management and continues to take hydrocodone and hydromorphone for pain relief. She has not yet scheduled a follow-up visit with Dr. Chase Caller but plans to.  She denies any recent or recurrent infections.     Activities of Daily Living:  Patient reports morning stiffness for several hours.   Patient Denies nocturnal pain.  Difficulty dressing/grooming: Reports Difficulty climbing stairs: Reports Difficulty getting out of chair: Reports Difficulty using hands for taps, buttons, cutlery, and/or writing: Reports  Review of Systems  Constitutional:  Positive for fatigue.  HENT:  Positive for mouth sores and mouth dryness.   Eyes:   Positive for dryness.  Respiratory:  Negative for shortness of breath.   Cardiovascular:  Negative for chest pain and palpitations.  Gastrointestinal:  Positive for constipation. Negative for blood in stool and diarrhea.  Endocrine: Negative for increased urination.  Genitourinary:  Negative for involuntary urination.  Musculoskeletal:  Positive for joint pain, gait problem, joint pain, joint swelling, myalgias, muscle weakness, morning stiffness, muscle tenderness and myalgias.  Skin:  Negative for color change, rash, hair loss and sensitivity to sunlight.  Allergic/Immunologic: Negative for susceptible to infections.  Neurological:  Negative for dizziness and headaches.  Hematological:  Positive for swollen glands.  Psychiatric/Behavioral:  Negative for depressed mood and sleep disturbance. The patient is not nervous/anxious.     PMFS History:  Patient Active Problem List   Diagnosis Date Noted   Dysuria 09/07/2021   Rheumatoid arthritis ([place]) 05/27/2021   Chronic venous insufficiency 05/23/2021   AVM (arteriovenous malformation) 04/04/2021   CAD (coronary artery disease) 01/22/2021   Primary osteoarthritis of both knees 01/12/2021   Positive ANA (antinuclear antibody) 11/17/2020   Joint pain 11/13/2020   Current use of proton pump inhibitor 08/26/2020   Estrogen deficiency 08/21/2018   TMJ (dislocation of temporomandibular joint), initial encounter 05/05/2018   Anterior neck pain 05/05/2018   Chronic pain syndrome 11/09/2017   Chronic upper extremity pain [facility] Area of Pain) (Bilateral) (L>R) 11/09/2017   Fibromyalgia syndrome 11/09/2017  Osteoarthritis 11/09/2017   Osteoarthritis of lumbar facet joint (Bilateral) 11/09/2017   Lumbar facet arthropathy (Bilateral) 11/09/2017   Lumbar facet syndrome (Bilateral) (L>R) 11/09/2017   Lumbar foraminal stenosis (multilevel) (Bilateral) 11/09/2017   DDD (degenerative disc disease), lumbar 11/09/2017   Thoracic levoscoliosis  11/09/2017   Thoracic facet syndrome (Bilateral) (L>R) 11/09/2017   Thoracic facet arthropathy (Bilateral) (R>L) 11/09/2017   DDD (degenerative disc disease), thoracic 11/09/2017   Thoracolumbar IVDD 11/09/2017   DDD (degenerative disc disease), cervical 11/09/2017   Osteoarthritis of cervical facet (Bilateral) 11/09/2017   Grade 1 Anterolisthesis of C7 over T1 11/09/2017   Cervical foraminal stenosis (Bilateral) 11/09/2017   History of fusion of cervical spine (C5-6 ACDF) 11/09/2017   Cervical facet syndrome (Bilateral) 11/09/2017   Disorder of skeletal system 11/09/2017   Cervical radiculitis (Bilateral) 11/09/2017   Lumbar Epidural lipomatosis 11/09/2017   Chronic sacroiliac joint pain (Bilateral) (L>R) 11/09/2017   Chronic hip pain (Bilateral) (L>R) 11/09/2017   Chronic upper back pain (Primary Area of Pain) (midline) 10/24/2017   Chronic neck pain (Secondary Area of Pain) (Bilateral)  (L>R) 10/24/2017   Chronic low back pain (Fourth Area of Pain) (Bilateral) (L>R) 10/24/2017   Chronic lower extremity pain (Fifth Area of Pain) (Bilateral) (L>R) 10/24/2017   Lumbar Grade 1 Retrolisthesis of L1-2 and L2-3 10/24/2017   Other long term (current) drug therapy 10/24/2017   Other specified health status 10/24/2017   Long term current use of opiate analgesic 10/24/2017   Long term prescription opiate use 10/24/2017   Opiate use 10/24/2017   DM type 2 (diabetes mellitus, type 2) ([place]) 06/14/2014   Pharmacologic therapy 06/14/2014   Pedal edema    Post-menopausal 06/23/2012   Lumbar disc disease with radiculopathy 11/18/2011   HTN (hypertension) 07/30/2011   Raynaud disease 07/30/2011   Obesity 07/30/2011   Routine general medical examination at a health care facility 03/28/2011   Palpitations 04/27/2010   Depression with anxiety 06/14/2008   Vitamin D deficiency 05/13/2008   ANXIETY 03/25/2008   Osteopenia 03/25/2008   Hypothyroidism 03/28/2007   Hyperlipidemia associated with  type 2 diabetes mellitus ([place]) 03/28/2007   Other iron deficiency anemias 03/28/2007   PANIC ATTACK 03/28/2007   KERATOCONJUNCTIVITIS SICCA 03/28/2007   Mitral valve disorder 03/28/2007   ABNORMAL HEART RHYTHMS 03/28/2007   Raynaud's syndrome 03/28/2007   GERD 03/28/2007   IBS 03/28/2007   Rosacea 03/28/2007   PLANTAR FASCIITIS 03/28/2007   MIGRAINES, HX OF 03/28/2007    Past Medical History:  Diagnosis Date   Acute renal insufficiency 05/31/2014   pt not aware of this diagnosis   Anemia, iron deficiency    Anxiety    Bilateral lower extremity edema    a. uses torsemide   Cervical dysplasia    abnormal paps   Colon cancer screening 06/14/2014   Coronary artery disease    Cough 08/08/2012   Degenerative disk disease 11/19/2011   Depression    Diabetes mellitus type II    Diverticulosis    DJD (degenerative joint disease)    Drug rash 05/22/2011   Dry eyes    Dysrhythmia    metoprolol.   Edema    Elevated liver enzymes 03/21/2012   Encounter for routine gynecological examination 06/14/2014   ESOPHAGITIS 03/28/2007   Qualifier: Hospitalized for  By: Marcelino Scot CMA, [place]     Fibromyalgia    GASTRITIS 03/28/2007   Qualifier: History of  By: Marcelino Scot CMA, [place] Bilberry     GERD (gastroesophageal reflux disease)  Hemorrhoids    external   HLD (hyperlipidemia)    HNP (herniated nucleus pulposus) 11/1997   T6,7,8 with DJD   HTN (hypertension)    Hyperglycemia 05/13/2008   Qualifier: Diagnosis of  By: Glori Bickers MD, Carmell [place]    Hypothyroidism    Interstitial lung disease ([place])    pt not aware of this   Left ovarian cyst    x 3, rupture   Osteoarthritis    hands   Osteopenia    mild-11/01; improved 12/05   Other screening mammogram 08/18/2011   Palpitations    PERSONAL HISTORY ALLERGY UNSPEC MEDICINAL AGENT 03/18/2010   Qualifier: Diagnosis of  By: Glori Bickers MD, Carmell [place]    PONV (postoperative nausea and vomiting)    Raynaud's disease    Recurrent HSV (herpes  simplex virus)    lesions in nose or mouth with frequent ulcers. d/t meds and dry mouth   Rheumatoid arthritis ([place])    Rhinitis 03/21/2012   Routine general medical examination at a health care facility 03/28/2011    Family History  Problem Relation Age of Onset   Lung cancer Mother        + smoker   Coronary artery disease Mother        relatively young   Emphysema Father        + smoker   Lymphoma Sister    Heart disease Brother    Lymphoma Brother    Parkinson's disease Brother    Anemia Brother        aplastic    Diabetes Brother    Iron deficiency Daughter    Leukemia Son    Breast cancer Neg Hx    Past Surgical History:  Procedure Laterality Date   ABDOMINAL EXPLORATION SURGERY     ACDF  1991   BIOPSY OF SKIN SUBCUTANEOUS TISSUE AND/OR MUCOUS MEMBRANE Left 04/08/2021   Procedure: BIOPSY OF SKIN SUBCUTANEOUS TISSUE AND/OR MUCOUS MEMBRANE ( REMOVAL SKIN NODULE);  Surgeon: Katha Cabal, MD;  Location: [facility] ORS;  Service: Vascular;  Laterality: Left;   COLONOSCOPY  08/2000   Diverticulosis; hemorrhoids   HAND SURGERY Left    left thumb. PIN REMOVED   LASIK     bilateral   Social History   Social History Narrative   Married1 Investment banker, corporate to dean at [employer] regularly exerciseDaily caffeine use: 2/day.   Lives with husband. Feels safe in her home.   Immunization History  Administered Date(s) Administered   Fluad Quad(high Dose 65+) 08/07/2020, 07/13/2022   Influenza Split 06/19/2011   Influenza Whole 08/01/2009   Influenza, High Dose Seasonal PF 08/14/2018, 07/03/2019   Influenza,inj,Quad PF,6+ Mos 06/14/2014, 08/25/2015, 07/26/2016, 08/19/2017   Moderna Sars-Covid-2 Vaccination 09/17/2020   PFIZER(Purple Top)SARS-COV-2 Vaccination 11/27/2019, 12/18/2019   Pneumococcal Conjugate-13 08/25/2015   Pneumococcal Polysaccharide-23 08/25/2016   Td 11/25/2003   Tdap 06/14/2014   Zoster, Live 09/16/2014     Objective: Vital Signs: BP (!) 153/96 (BP  Location: Left Arm, Patient Position: Sitting, Cuff Size: Normal)   Pulse 86   Resp 16   Ht '5\' 1"'$  (1.549 m)   Wt 185 lb 6.4 oz (84.1 kg)   SpO2 94%   BMI 35.03 kg/m    Physical Exam Vitals and nursing note reviewed.  Constitutional:      Appearance: She is well-developed.  HENT:     Head: Normocephalic and atraumatic.  Eyes:     Conjunctiva/sclera: Conjunctivae normal.  Cardiovascular:     Rate and Rhythm: Normal rate and  regular rhythm.     Heart sounds: Normal heart sounds.  Pulmonary:     Effort: Pulmonary effort is normal.     Breath sounds: Normal breath sounds.  Abdominal:     General: Bowel sounds are normal.     Palpations: Abdomen is soft.  Musculoskeletal:     Cervical back: Normal range of motion.  Skin:    General: Skin is warm and dry.     Capillary Refill: Capillary refill takes less than 2 seconds.  Neurological:     Mental Status: She is alert and oriented to person, place, and time.  Psychiatric:        Behavior: Behavior normal.      Musculoskeletal Exam: Patient remained seated during the examination today.  C-spine has limited range of motion with lateral rotation.  Shoulder joints have good range of motion with some stiffness bilaterally.  Elbow joints have good range of motion with no tenderness or inflammation.  Slightly limited extension of both wrist joints.  Tenderness and synovitis over several PIP joints as described below.  Difficulty making a complete fist bilaterally.  Hip joints difficult to assess in seated position.  Painful range of motion of both knee joints but no effusion noted.  Ankle joints have good range of motion with tenderness bilaterally.   CDAI Exam: CDAI Score: 13.6  Patient Global: 8 mm; Provider Global: 8 mm Swollen: 4 ; Tender: 8  Joint Exam 09/16/2022      Right  Left  Wrist   Tender   Tender  PIP 2  Swollen Tender     PIP 3  Swollen Tender  Swollen Tender  PIP 5  Swollen Tender     Knee   Tender   Tender      Investigation: No additional findings.  Imaging: No results found.  Recent Labs: Lab Results  Component Value Date   WBC 9.1 07/06/2022   HGB 13.4 07/06/2022   PLT 319 07/06/2022   NA 140 07/06/2022   K 5.3 (H) 07/06/2022   CL 98 07/06/2022   CO2 26 07/06/2022   GLUCOSE 178 (H) 07/06/2022   BUN 18 07/06/2022   CREATININE 0.85 07/06/2022   BILITOT 0.4 07/06/2022   ALKPHOS 81 07/06/2022   AST 15 07/06/2022   ALT 17 07/06/2022   PROT 7.1 07/06/2022   ALBUMIN 4.2 07/06/2022   CALCIUM 9.6 07/06/2022   GFRAA 75 03/24/2021   QFTBGOLDPLUS NEGATIVE 02/03/2022    Speciality Comments: Arava- mood disorder  Procedures:  No procedures performed Allergies: Ceftin [cefuroxime], Clonidine derivatives, Erythromycin, Keflex [cephalexin], Penicillins, Sulfonamide derivatives, Ciprofloxacin, Fluoxetine hcl, Furosemide, Gabapentin, Hydroxychloroquine, Paroxetine, Pregabalin, Tetracycline, Venlafaxine, Etodolac, Macrobid [nitrofurantoin], Amitriptyline hcl, Atorvastatin, Benicar [olmesartan medoxomil], Cephalexin, Cetirizine hcl, Diovan [valsartan], and Naproxen sodium      Assessment / Plan:     Visit Diagnoses: Rheumatoid arthritis of multiple sites with negative rheumatoid factor ([place]) -Patient continues to experience severe and frequent rheumatoid arthritis flares.  She is currently on Actemra 162 mg subcutaneous injections every 14 days and prednisone 10 mg daily.  She has been having severe pain and inflammation in both wrists, both hands, and both knee joints.  She is been having difficulty rising from a seated position due to the severity of pain in her knees and difficulty pushing off with her hands.  She is been using a cane to assist with ambulation but has been more inactive than usual.  She continues to follow-up with pain management.  Discussed that she will  benefit from combination therapy. Different treatment options were discussed today in detail. Previous therapy  includes Plaquenil, Imuran, Arava. Plaquenil was discontinued due to generalized itching.  Imuran was discontinued due to GI intolerance.  Jolee Ewing was discontinued due to mood changes/tearfulness-which was around the time that she discontinued Cymbalta after many years of use.  Patient would like to retry [place] since she has restarted on Cymbalta.  She was advised to discontinue Arava if she develops any mood changes.  Reviewed the indications, contraindications, and potential side effects of arrival today in detail.  The patient and her husband both agree that it is worth retrying Arava since she has restarted cymbalta.  She will notify us if she cannot tolerate taking arava.  She will remain on Actemra as combination therapy.  Refill of Actemra was sent to the pharmacy today.  She will also remain on prednisone 10 mg daily for now.  Once her disease is better controlled we can try tapering the dose of prednisone gradually in the future.  She will follow-up in the office in 6 weeks or sooner if needed.  Plan: Tocilizumab (ACTEMRA ACTPEN) 162 MG/0.9ML SOAJ  Medication counseling:   Baseline Immunosuppressant Therapy Labs     Latest Ref Rng & Units 02/03/2022   11:19 AM  Quantiferon TB Gold  Quantiferon TB Gold Plus NEGATIVE NEGATIVE        Latest Ref Rng & Units 01/13/2021   12:29 PM  Hepatitis  Hep B Surface Ag NON-REACTI NON-REACTIVE   Hep B IgM NON-REACTI NON-REACTIVE   Hep C Ab NON-REACTI NON-REACTIVE     Lab Results  Component Value Date   HIV NON-REACTIVE 01/13/2021       Latest Ref Rng & Units 01/13/2021   12:29 PM  Immunoglobulin Electrophoresis  IgA  70 - 320 mg/dL 156   IgG 600 - 1,540 mg/dL 1,246   IgM 50 - 300 mg/dL 92        Latest Ref Rng & Units 07/06/2022    9:08 AM  Serum Protein Electrophoresis  Total Protein 6.0 - 8.5 g/dL 7.1     No results found for: "G6PDH"  Lab Results  Component Value Date   TPMT 20 01/13/2021     Patient was counseled on the  purpose, proper use, and adverse effects of leflunomide including risk of infection, nausea/diarrhea/weight loss, increase in blood pressure, rash, hair loss, tingling in the hands and feet, and signs and symptoms of interstitial lung disease.   Also counseled on Black Box warning of liver injury and importance of avoiding alcohol while on therapy. Discussed that there is the possibility of an increased risk of malignancy but it is not well understood if this increased risk is due to the medication or the disease state.  Counseled patient to avoid live vaccines. Recommend annual influenza, Pneumovax 23, Prevnar 13, and Shingrix as indicated.   Discussed the importance of frequent monitoring of liver function and blood count.  Standing orders placed.  Discussed importance of birth control while on leflunomide due to risk of congenital abnormalities, and patient confirms she is postmenopausal.  Provided patient with educational materials on leflunomide and answered all questions.  Patient consented to [place] use, and consent will be uploaded into the media tab.   Patient dose will be 10 mg daily x2 weeks and if labs are stable she will increase to 20 mg daily.  Prescription pending lab results and/or insurance approval.  High risk medication use - Actemra 162 mg subcutaneous  injections every 14 days.  Prednisone 10 mg daily. Adding on Arava 10 mg daily x 2 weeks and if labs are stable she will increase to 20 mg daily. Prescription for arava  will be sent to the pharmacy pending lab results today. CBC and CMP updated on 07/06/22.  CBC and CMP updated today.  She will return for lab work in 2 weeks x 2, 2 months, then every 3 months.  Standing orders for CBC and CMP remain in place. TB gold negative on 02/03/22.  No recent or recurrent infections.  Discussed the importance of holding actemra and arava if she develops signs or symptoms of an infection and to resume once the infection has completely cleared.   Previous therapy: Plaquenil discontinued due to generalized itching.  Imuran discontinued due to GI intolerance.  Not a good candidate for methotrexate due to underlying ILD.  Not a good candidate for sulfasalazine due to sulfa allergy. Patient has been unable to taper prednisone without recurrent flares.  She is aware of the risks of long-term prednisone use.  Dr. Glori Bickers has ordered an updated bone density. - Plan: Tocilizumab (ACTEMRA ACTPEN) 162 MG/0.9ML SOAJ, CBC with Differential/Platelet, COMPLETE METABOLIC PANEL WITH GFR  ILD (interstitial lung disease) ([place]) - HR chest CT ordered on 01/16/2021: Mildy progressive compared to 69yrago. Patient was evaluated by Dr. RChase Calleron 03/26/2022.  She does not require antifibrotic therapy at this time.  She was advised to call Dr. MNicholos Johnsoffice to schedule an updated follow-up visit. She remains on Actemra as prescribed.- Plan: Tocilizumab (ACTEMRA ACTPEN) 162 MG/0.9ML SOAJ  Raynaud's disease without gangrene: She continues to experience intermittent symptoms of Raynaud's phenomenon.  No digital ulcerations or signs of gangrene were noted on examination today.  Chronic left shoulder pain: She had a left glenohumeral joint cortisone injection on 02/03/2022.  She has been performing shoulder joint exercises in the morning which has been helping with her stiffness.  Primary osteoarthritis of both knees: Patient has been experiencing increased pain and stiffness in both knee joints.  She is not having difficulty rising from a seated position due to the severity of pain.  She is looking into getting a lift chair to help her at home.  She has been having to use a cane to assist with ambulation.  Patient requested a cortisone injection today but we opted out of performing the injection due to elevated blood pressure as well as underlying type 2 diabetes.  Discussed the possibility of Visco gel injections in the future.  The patient was encouraged to monitor her blood  pressure closely and if her blood pressure is better controlled we can consider a cortisone injection in the future.  DDD (degenerative disc disease), cervical: Chronic pain and stiffness.  History of fusion of cervical spine (C5-6 ACDF): Limited range of motion with lateral rotation.  DDD (degenerative disc disease), thoracic - Thoracic kyphosis noted.  DDD (degenerative disc disease), lumbar: Chronic pain.  Fibromyalgia syndrome: She continues to have generalized hyperalgesia and positive tender points on examination today.  She was restarted on Cymbalta 30 mg 1 capsule daily on 08/25/2022.  She is planning on getting increasing the dose of Cymbalta to 60 mg daily since she has been tolerating Cymbalta without any side effects.  Chronic pain syndrome - She follows up closely with pain management.  Long term current use of opiate analgesic - Followed by pain management.  Patient has a prescription for hydromorphone and oxycodone for pain relief.  Osteopenia of multiple  sites: DEXA 11/01/18: The BMD measured at Femur Total Left is 0.822 g/cm2 with a T-score of -1.5.  Updated DEXA has been ordered by Dr. Glori Bickers.  Patient remains on long-term prednisone.  She is taking vitamin D 1000 units daily.  She will likely benefit from adding on a bisphosphonate given her long-term prednisone use.  We can further discuss treatment options pending her DEXA results at her scheduled follow-up visit.  Other medical conditions are listed as follows:  Chronic renal impairment, stage 3a ([place])  Type 2 diabetes mellitus without complication, without long-term current use of insulin ([place])  Depression with anxiety  History of IBS  Hyperlipidemia associated with type 2 diabetes mellitus ([place])  History of hypothyroidism  Primary hypertension: Blood pressure was 153/96 today in the office.  Patient was advised to monitor blood pressure closely and to reach out to Dr. Glori Bickers if her blood pressure remains  elevated.  Mitral valve disorder  Hx of arteriovenous malformation (AVM) - Status post removal of AV malformation left leg.  Followed by vascular surgery-Dr. Delana Meyer.  History of gastroesophageal reflux (GERD)  Venous ulcer of left leg ([facility]) - Ulceration has healed.   Orders: Orders Placed This Encounter  Procedures   CBC with Differential/Platelet   COMPLETE METABOLIC PANEL WITH GFR   Meds ordered this encounter  Medications   Tocilizumab (ACTEMRA ACTPEN) 162 MG/0.9ML SOAJ    Sig: Inject 162 mg into the skin every 14 (fourteen) days.    Dispense:  5.4 mL    Refill:  0     Follow-Up Instructions: Return in about 6 weeks (around 10/28/2022) for Rheumatoid arthritis, ILD, DDD.   Ofilia Neas, PA-C  Note - This record has been created using Dragon software.  Chart creation errors have been sought, but may not always  have been located. Such creation errors do not reflect on  the standard of medical care.

## 2022-09-07 ENCOUNTER — Telehealth: Payer: Self-pay | Admitting: Family Medicine

## 2022-09-07 DIAGNOSIS — E1169 Type 2 diabetes mellitus with other specified complication: Secondary | ICD-10-CM

## 2022-09-07 DIAGNOSIS — E119 Type 2 diabetes mellitus without complications: Secondary | ICD-10-CM

## 2022-09-07 DIAGNOSIS — E039 Hypothyroidism, unspecified: Secondary | ICD-10-CM

## 2022-09-07 DIAGNOSIS — I10 Essential (primary) hypertension: Secondary | ICD-10-CM

## 2022-09-07 NOTE — Telephone Encounter (Signed)
Labs released and pt notified she can go to [facility] a few days before her f/u

## 2022-09-07 NOTE — Telephone Encounter (Signed)
F/u is scheduled 10/13/22, please put [facility] labs in and I will release them to their sxs so she can go to their [facility]

## 2022-09-07 NOTE — Telephone Encounter (Signed)
Done. Thanks.

## 2022-09-07 NOTE — Telephone Encounter (Signed)
The patient has decided to go to the following location for their labs: [facility] in [place] to be drawn (this info should already be in the appointment notes):  Follow up in 3 months for diabetes and thyroid with labs prior

## 2022-09-07 NOTE — Addendum Note (Signed)
Addended by: Loura Pardon A on: 09/07/2022 12:14 PM   Modules accepted: Orders

## 2022-09-08 ENCOUNTER — Other Ambulatory Visit: Payer: Self-pay | Admitting: Physician Assistant

## 2022-09-08 NOTE — Telephone Encounter (Signed)
Next Visit: 09/16/2022   Last Visit: 06/10/2022   Last Fill: 1012/2023   Dx:  Rheumatoid arthritis of multiple sites with negative rheumatoid factor    Current Dose per office note on 06/10/2022: Prednisone 7 mg daily   Okay to refill Prednisone?

## 2022-09-14 ENCOUNTER — Other Ambulatory Visit: Payer: Medicare Other

## 2022-09-16 ENCOUNTER — Encounter: Payer: Self-pay | Admitting: Physician Assistant

## 2022-09-16 ENCOUNTER — Ambulatory Visit: Payer: Medicare Other | Attending: Physician Assistant | Admitting: Physician Assistant

## 2022-09-16 ENCOUNTER — Encounter: Payer: Self-pay | Admitting: Family Medicine

## 2022-09-16 VITALS — BP 153/96 | HR 86 | Resp 16 | Ht 61.0 in | Wt 185.4 lb

## 2022-09-16 DIAGNOSIS — Z981 Arthrodesis status: Secondary | ICD-10-CM

## 2022-09-16 DIAGNOSIS — M5136 Other intervertebral disc degeneration, lumbar region: Secondary | ICD-10-CM

## 2022-09-16 DIAGNOSIS — J849 Interstitial pulmonary disease, unspecified: Secondary | ICD-10-CM | POA: Diagnosis not present

## 2022-09-16 DIAGNOSIS — I059 Rheumatic mitral valve disease, unspecified: Secondary | ICD-10-CM

## 2022-09-16 DIAGNOSIS — E785 Hyperlipidemia, unspecified: Secondary | ICD-10-CM

## 2022-09-16 DIAGNOSIS — Z79891 Long term (current) use of opiate analgesic: Secondary | ICD-10-CM

## 2022-09-16 DIAGNOSIS — I7300 Raynaud's syndrome without gangrene: Secondary | ICD-10-CM | POA: Diagnosis not present

## 2022-09-16 DIAGNOSIS — Z8639 Personal history of other endocrine, nutritional and metabolic disease: Secondary | ICD-10-CM

## 2022-09-16 DIAGNOSIS — E1169 Type 2 diabetes mellitus with other specified complication: Secondary | ICD-10-CM

## 2022-09-16 DIAGNOSIS — L97929 Non-pressure chronic ulcer of unspecified part of left lower leg with unspecified severity: Secondary | ICD-10-CM

## 2022-09-16 DIAGNOSIS — M170 Bilateral primary osteoarthritis of knee: Secondary | ICD-10-CM | POA: Diagnosis not present

## 2022-09-16 DIAGNOSIS — I10 Essential (primary) hypertension: Secondary | ICD-10-CM

## 2022-09-16 DIAGNOSIS — E119 Type 2 diabetes mellitus without complications: Secondary | ICD-10-CM

## 2022-09-16 DIAGNOSIS — M8589 Other specified disorders of bone density and structure, multiple sites: Secondary | ICD-10-CM

## 2022-09-16 DIAGNOSIS — Z79899 Other long term (current) drug therapy: Secondary | ICD-10-CM | POA: Diagnosis not present

## 2022-09-16 DIAGNOSIS — M797 Fibromyalgia: Secondary | ICD-10-CM

## 2022-09-16 DIAGNOSIS — M0609 Rheumatoid arthritis without rheumatoid factor, multiple sites: Secondary | ICD-10-CM

## 2022-09-16 DIAGNOSIS — G8929 Other chronic pain: Secondary | ICD-10-CM

## 2022-09-16 DIAGNOSIS — M5030 Other cervical disc degeneration, unspecified cervical region: Secondary | ICD-10-CM

## 2022-09-16 DIAGNOSIS — G894 Chronic pain syndrome: Secondary | ICD-10-CM

## 2022-09-16 DIAGNOSIS — M25512 Pain in left shoulder: Secondary | ICD-10-CM

## 2022-09-16 DIAGNOSIS — N1831 Chronic kidney disease, stage 3a: Secondary | ICD-10-CM

## 2022-09-16 DIAGNOSIS — M5134 Other intervertebral disc degeneration, thoracic region: Secondary | ICD-10-CM | POA: Diagnosis not present

## 2022-09-16 DIAGNOSIS — Z8774 Personal history of (corrected) congenital malformations of heart and circulatory system: Secondary | ICD-10-CM

## 2022-09-16 DIAGNOSIS — F418 Other specified anxiety disorders: Secondary | ICD-10-CM

## 2022-09-16 DIAGNOSIS — I83029 Varicose veins of left lower extremity with ulcer of unspecified site: Secondary | ICD-10-CM

## 2022-09-16 DIAGNOSIS — M51369 Other intervertebral disc degeneration, lumbar region without mention of lumbar back pain or lower extremity pain: Secondary | ICD-10-CM

## 2022-09-16 DIAGNOSIS — Z8719 Personal history of other diseases of the digestive system: Secondary | ICD-10-CM

## 2022-09-16 MED ORDER — ACTEMRA ACTPEN 162 MG/0.9ML ~~LOC~~ SOAJ: 162.0000 mg | SUBCUTANEOUS | 0 refills | Status: DC

## 2022-09-16 NOTE — Patient Instructions (Addendum)
Standing Labs We placed an order today for your standing lab work.   Please have your standing labs drawn in 2 weeks x2, 2 months, then every 3 months.    Please have your labs drawn 2 weeks prior to your appointment so that the provider can discuss your lab results at your appointment.  Please note that you may see your imaging and lab results in [place] before we have reviewed them. We will contact you once all results are reviewed. Please allow our office up to 72 hours to thoroughly review all of the results before contacting the office for clarification of your results.  Lab hours are:   Monday through Thursday from 8:00 am -12:30 pm and 1:00 pm-5:00 pm and Friday from 8:00 am-12:00 pm.  Please be advised, all patients with office appointments requiring lab work will take precedent over walk-in lab work.   Labs are drawn by [facility]. Please bring your co-pay at the time of your lab draw.  You may receive a bill from [facility] for your lab work.  Please note if you are on Hydroxychloroquine and and an order has been placed for a Hydroxychloroquine level, you will need to have it drawn 4 hours or more after your last dose.  If you wish to have your labs drawn at another location, please call the office 24 hours in advance so we can fax the orders.  The office is located at [address] [place], [place], [place] [address] No appointment is necessary.    If you have any questions regarding directions or hours of operation,  please call [phone].   As a reminder, please drink plenty of water prior to coming for your lab work. Thanks!   Leflunomide Tablets What is this medication? LEFLUNOMIDE (le FLOO na mide) treats the symptoms of rheumatoid arthritis. It works by slowing down an overactive immune system. This decreases inflammation. It belongs to a group of medications called DMARDs. This medicine may be used for other purposes; ask your health care provider or pharmacist if you  have questions. COMMON BRAND NAME(S): Arava What should I tell my care team before I take this medication? They need to know if you have any of these conditions: Cancer Diabetes High blood pressure Immune system problems Infection Kidney disease Liver disease Low blood cell levels (white cells, red cells, and platelets) Lung or breathing disease, such as asthma or COPD Recent or upcoming vaccine Skin conditions Tingling of the fingers or toes, or other nerve disorder An unusual or allergic reaction to leflunomide, other medications, food, dyes, or preservatives Pregnant or trying to get pregnant Breastfeeding How should I use this medication? Take this medication by mouth with a full glass of water. Take it as directed on the prescription label at the same time every day. Keep taking it unless your care team tells you to stop. Talk to your care team about the use of this medication in children. Special care may be needed. Overdosage: If you think you have taken too much of this medicine contact a poison control center or emergency room at once. NOTE: This medicine is only for you. Do not share this medicine with others. What if I miss a dose? If you miss a dose, take it as soon as you can. If it is almost time for your next dose, take only that dose. Do not take double or extra doses. What may interact with this medication? Do not take this medication with any of the following:  Teriflunomide This medication may also interact with the following: Alosetron Caffeine Cefaclor Certain medications for diabetes, such as nateglinide, repaglinide, rosiglitazone, pioglitazone Certain medications for high cholesterol, such as atorvastatin, pravastatin, rosuvastatin, simvastatin Charcoal Cholestyramine Ciprofloxacin Duloxetine Estrogen and progestin hormones Furosemide Ketoprofen Live virus vaccines Medications that increase your risk for  infection Methotrexate Mitoxantrone Paclitaxel Penicillin Theophylline Tizanidine Warfarin This list may not describe all possible interactions. Give your health care provider a list of all the medicines, herbs, non-prescription drugs, or dietary supplements you use. Also tell them if you smoke, drink alcohol, or use illegal drugs. Some items may interact with your medicine. What should I watch for while using this medication? Visit your care team for regular checks on your progress. Tell your care team if your symptoms do not start to get better or if they get worse. You may need blood work done while you are taking this medication. This medication may cause serious skin reactions. They can happen weeks to months after starting the medication. Contact your care team right away if you notice fevers or flu-like symptoms with a rash. The rash may be red or purple and then turn into blisters or peeling of the skin. You may also notice a red rash with swelling of the face, lips, or lymph nodes in your neck or under your arms. You should not receive certain vaccines during your treatment and for a certain time after your treatment with this medication ends. Talk to your care team for more information. This medication may stay in your body for up to 2 years after your last dose. Tell your care team about any unusual side effects or symptoms. A medication can be given to help lower your blood levels of this medication more quickly. Talk to your care team if you may be pregnant. This medication can cause serious birth defects if taken during pregnancy and for a while after the last dose. You will need a negative pregnancy test before starting this medication. Contraception is recommended while taking this medication and for a while after the last dose. Your care team can help you find the option that works for you. Do not breastfeed while taking this medication. What side effects may I notice from receiving  this medication? Side effects that you should report to your care team as soon as possible: Allergic reactions--skin rash, itching, hives, swelling of the face, lips, tongue, or throat Dry cough, shortness of breath or trouble breathing Increase in blood pressure Infection--fever, chills, cough, sore throat, wounds that don't heal, pain or trouble when passing urine, general feeling of discomfort or being unwell Redness, blistering, peeling, or loosening of the skin, including inside the mouth Liver injury--right upper belly pain, loss of appetite, nausea, light-colored stool, dark yellow or brown urine, yellowing skin or eyes, unusual weakness or fatigue Pain, tingling, or numbness in the hands or feet Unusual bruising or bleeding Side effects that usually do not require medical attention (report to your care team if they continue or are bothersome): Back pain Diarrhea Hair loss Headache Nausea This list may not describe all possible side effects. Call your doctor for medical advice about side effects. You may report side effects to FDA at 1-800-FDA-1088. Where should I keep my medication? Keep out of the reach of children and pets. Store at room temperature between 20 and 25 degrees C (68 and 77 degrees F). Protect from moisture and light. Keep the container tightly closed. Get rid of any unused medication after the expiration  date. To get rid of medications that are no longer needed or have expired: Take the medication to a medication take-back program. Check with your pharmacy or law enforcement to find a location. If you cannot return the medication, ask your pharmacist or care team how to get rid of this medication safely. NOTE: This sheet is a summary. It may not cover all possible information. If you have questions about this medicine, talk to your doctor, pharmacist, or health care provider.  2023 Elsevier/Gold Standard (2007-11-25 00:00:00)

## 2022-09-17 ENCOUNTER — Other Ambulatory Visit: Payer: Self-pay

## 2022-09-17 ENCOUNTER — Other Ambulatory Visit: Payer: Self-pay | Admitting: Family Medicine

## 2022-09-17 LAB — CBC WITH DIFFERENTIAL/PLATELET
Absolute Monocytes: 582 cells/uL (ref 200–950)
Basophils Absolute: 49 cells/uL (ref 0–200)
Basophils Relative: 0.6 %
Eosinophils Absolute: 74 cells/uL (ref 15–500)
Eosinophils Relative: 0.9 %
HCT: 40.3 % (ref 35.0–45.0)
Hemoglobin: 13.4 g/dL (ref 11.7–15.5)
Lymphs Abs: 1591 cells/uL (ref 850–3900)
MCH: 29.5 pg (ref 27.0–33.0)
MCHC: 33.3 g/dL (ref 32.0–36.0)
MCV: 88.6 fL (ref 80.0–100.0)
MPV: 9.6 fL (ref 7.5–12.5)
Monocytes Relative: 7.1 %
Neutro Abs: 5904 cells/uL (ref 1500–7800)
Neutrophils Relative %: 72 %
Platelets: 396 10*3/uL (ref 140–400)
RBC: 4.55 10*6/uL (ref 3.80–5.10)
RDW: 12.3 % (ref 11.0–15.0)
Total Lymphocyte: 19.4 %
WBC: 8.2 10*3/uL (ref 3.8–10.8)

## 2022-09-17 LAB — COMPLETE METABOLIC PANEL WITH GFR
AG Ratio: 1.3 (calc) (ref 1.0–2.5)
ALT: 10 U/L (ref 6–29)
AST: 14 U/L (ref 10–35)
Albumin: 4.4 g/dL (ref 3.6–5.1)
Alkaline phosphatase (APISO): 52 U/L (ref 37–153)
BUN: 23 mg/dL (ref 7–25)
CO2: 28 mmol/L (ref 20–32)
Calcium: 9.7 mg/dL (ref 8.6–10.4)
Chloride: 96 mmol/L — ABNORMAL LOW (ref 98–110)
Creat: 0.74 mg/dL (ref 0.60–1.00)
Globulin: 3.4 g/dL (calc) (ref 1.9–3.7)
Glucose, Bld: 111 mg/dL — ABNORMAL HIGH (ref 65–99)
Potassium: 4.9 mmol/L (ref 3.5–5.3)
Sodium: 137 mmol/L (ref 135–146)
Total Bilirubin: 0.3 mg/dL (ref 0.2–1.2)
Total Protein: 7.8 g/dL (ref 6.1–8.1)
eGFR: 86 mL/min/{1.73_m2} (ref >=60)

## 2022-09-17 MED ORDER — LEFLUNOMIDE 10 MG PO TABS: ORAL_TABLET | ORAL | 0 refills | Status: DC

## 2022-09-17 NOTE — Telephone Encounter (Signed)
CBC WNL  Glucose is 111.  Chloride is borderline low. Rest of CMP WNL.   Ok to initiate arava as discussed yesterday.

## 2022-09-17 NOTE — Telephone Encounter (Signed)
Pending lab results, patient will be starting arava '10mg'$  daily x2 weeks if labs are stable increase to '20mg'$  daily per Hazel Sams, PA-C. Thanks!

## 2022-09-17 NOTE — Progress Notes (Signed)
CBC WNL Glucose is 111.  Chloride is borderline low. Rest of CMP WNL.   Ok to initiate arava as discussed yesterday.

## 2022-09-20 ENCOUNTER — Other Ambulatory Visit: Payer: Self-pay | Admitting: *Deleted

## 2022-09-20 DIAGNOSIS — M069 Rheumatoid arthritis, unspecified: Secondary | ICD-10-CM | POA: Diagnosis not present

## 2022-09-20 DIAGNOSIS — M47814 Spondylosis without myelopathy or radiculopathy, thoracic region: Secondary | ICD-10-CM | POA: Diagnosis not present

## 2022-09-20 DIAGNOSIS — M47896 Other spondylosis, lumbar region: Secondary | ICD-10-CM | POA: Diagnosis not present

## 2022-09-20 DIAGNOSIS — M797 Fibromyalgia: Secondary | ICD-10-CM | POA: Diagnosis not present

## 2022-09-20 DIAGNOSIS — S22000A Wedge compression fracture of unspecified thoracic vertebra, initial encounter for closed fracture: Secondary | ICD-10-CM | POA: Diagnosis not present

## 2022-09-20 DIAGNOSIS — M4856XA Collapsed vertebra, not elsewhere classified, lumbar region, initial encounter for fracture: Secondary | ICD-10-CM | POA: Diagnosis not present

## 2022-09-20 DIAGNOSIS — G894 Chronic pain syndrome: Secondary | ICD-10-CM | POA: Diagnosis not present

## 2022-09-20 MED ORDER — PREDNISONE 10 MG PO TABS: 10.0000 mg | ORAL_TABLET | Freq: Every day | ORAL | 2 refills | Status: DC

## 2022-09-20 NOTE — Telephone Encounter (Signed)
Next Visit: 10/28/2022  Last Visit: 09/16/2022  Last Fill: 09/08/2022 '1mg'$ , 08/11/2022 5 mg  Dx: Raynaud's disease without gangrene   Current Dose per office note on 09/16/2022: Prednisone 10 mg daily   Okay to refill Prednisone 10 mg?

## 2022-09-24 DIAGNOSIS — M5416 Radiculopathy, lumbar region: Secondary | ICD-10-CM | POA: Diagnosis not present

## 2022-10-05 ENCOUNTER — Encounter: Payer: Self-pay | Admitting: Family Medicine

## 2022-10-05 MED ORDER — ONDANSETRON HCL 4 MG PO TABS: 4.0000 mg | ORAL_TABLET | Freq: Three times a day (TID) | ORAL | 0 refills | Status: AC | PRN

## 2022-10-05 NOTE — Telephone Encounter (Signed)
Please call pt and r/s appts

## 2022-10-06 ENCOUNTER — Other Ambulatory Visit: Payer: Medicare Other

## 2022-10-08 ENCOUNTER — Other Ambulatory Visit: Payer: Self-pay | Admitting: Physician Assistant

## 2022-10-13 ENCOUNTER — Ambulatory Visit: Payer: Medicare Other | Admitting: Family Medicine

## 2022-10-20 ENCOUNTER — Ambulatory Visit: Payer: Medicare Other | Admitting: Family Medicine

## 2022-10-21 DIAGNOSIS — K5903 Drug induced constipation: Secondary | ICD-10-CM | POA: Diagnosis not present

## 2022-10-21 DIAGNOSIS — M5137 Other intervertebral disc degeneration, lumbosacral region: Secondary | ICD-10-CM | POA: Diagnosis not present

## 2022-10-21 DIAGNOSIS — G894 Chronic pain syndrome: Secondary | ICD-10-CM | POA: Diagnosis not present

## 2022-10-21 DIAGNOSIS — Z79891 Long term (current) use of opiate analgesic: Secondary | ICD-10-CM | POA: Diagnosis not present

## 2022-10-21 DIAGNOSIS — M5134 Other intervertebral disc degeneration, thoracic region: Secondary | ICD-10-CM | POA: Diagnosis not present

## 2022-10-21 DIAGNOSIS — M4725 Other spondylosis with radiculopathy, thoracolumbar region: Secondary | ICD-10-CM | POA: Diagnosis not present

## 2022-10-21 DIAGNOSIS — M961 Postlaminectomy syndrome, not elsewhere classified: Secondary | ICD-10-CM | POA: Diagnosis not present

## 2022-10-21 NOTE — Progress Notes (Deleted)
Office Visit Note  Patient: Tina Berger             Date of Birth: 1951-06-25           MRN: [id number]             PCP: Abner Greenspan, MD Referring: Tower, [name], MD Visit Date: 10/28/2022 Occupation: @GUAROCC$ @  Subjective:  No chief complaint on file.   History of Present Illness: Tina Berger is a 72 y.o. female ***     Activities of Daily Living:  Patient reports morning stiffness for *** {minute/hour:19697}.   Patient {ACTIONS;DENIES/REPORTS:21021675::"Denies"} nocturnal pain.  Difficulty dressing/grooming: {ACTIONS;DENIES/REPORTS:21021675::"Denies"} Difficulty climbing stairs: {ACTIONS;DENIES/REPORTS:21021675::"Denies"} Difficulty getting out of chair: {ACTIONS;DENIES/REPORTS:21021675::"Denies"} Difficulty using hands for taps, buttons, cutlery, and/or writing: {ACTIONS;DENIES/REPORTS:21021675::"Denies"}  No Rheumatology ROS completed.   PMFS History:  Patient Active Problem List   Diagnosis Date Noted   Dysuria 09/07/2021   Rheumatoid arthritis ([place]) 05/27/2021   Chronic venous insufficiency 05/23/2021   AVM (arteriovenous malformation) 04/04/2021   CAD (coronary artery disease) 01/22/2021   Primary osteoarthritis of both knees 01/12/2021   Positive ANA (antinuclear antibody) 11/17/2020   Joint pain 11/13/2020   Current use of proton pump inhibitor 08/26/2020   Estrogen deficiency 08/21/2018   TMJ (dislocation of temporomandibular joint), initial encounter 05/05/2018   Anterior neck pain 05/05/2018   Chronic pain syndrome 11/09/2017   Chronic upper extremity pain [facility] Area of Pain) (Bilateral) (L>R) 11/09/2017   Fibromyalgia syndrome 11/09/2017   Osteoarthritis 11/09/2017   Osteoarthritis of lumbar facet joint (Bilateral) 11/09/2017   Lumbar facet arthropathy (Bilateral) 11/09/2017   Lumbar facet syndrome (Bilateral) (L>R) 11/09/2017   Lumbar foraminal stenosis (multilevel) (Bilateral) 11/09/2017   DDD (degenerative disc disease), lumbar  11/09/2017   Thoracic levoscoliosis 11/09/2017   Thoracic facet syndrome (Bilateral) (L>R) 11/09/2017   Thoracic facet arthropathy (Bilateral) (R>L) 11/09/2017   DDD (degenerative disc disease), thoracic 11/09/2017   Thoracolumbar IVDD 11/09/2017   DDD (degenerative disc disease), cervical 11/09/2017   Osteoarthritis of cervical facet (Bilateral) 11/09/2017   Grade 1 Anterolisthesis of C7 over T1 11/09/2017   Cervical foraminal stenosis (Bilateral) 11/09/2017   History of fusion of cervical spine (C5-6 ACDF) 11/09/2017   Cervical facet syndrome (Bilateral) 11/09/2017   Disorder of skeletal system 11/09/2017   Cervical radiculitis (Bilateral) 11/09/2017   Lumbar Epidural lipomatosis 11/09/2017   Chronic sacroiliac joint pain (Bilateral) (L>R) 11/09/2017   Chronic hip pain (Bilateral) (L>R) 11/09/2017   Chronic upper back pain (Primary Area of Pain) (midline) 10/24/2017   Chronic neck pain (Secondary Area of Pain) (Bilateral)  (L>R) 10/24/2017   Chronic low back pain (Fourth Area of Pain) (Bilateral) (L>R) 10/24/2017   Chronic lower extremity pain (Fifth Area of Pain) (Bilateral) (L>R) 10/24/2017   Lumbar Grade 1 Retrolisthesis of L1-2 and L2-3 10/24/2017   Other long term (current) drug therapy 10/24/2017   Other specified health status 10/24/2017   Long term current use of opiate analgesic 10/24/2017   Long term prescription opiate use 10/24/2017   Opiate use 10/24/2017   DM type 2 (diabetes mellitus, type 2) ([place]) 06/14/2014   Pharmacologic therapy 06/14/2014   Pedal edema    Post-menopausal 06/23/2012   Lumbar disc disease with radiculopathy 11/18/2011   HTN (hypertension) 07/30/2011   Raynaud disease 07/30/2011   Obesity 07/30/2011   Routine general medical examination at a health care facility 03/28/2011   Palpitations 04/27/2010   Depression with anxiety 06/14/2008   Vitamin D deficiency 05/13/2008   ANXIETY  03/25/2008   Osteopenia 03/25/2008   Hypothyroidism 03/28/2007    Hyperlipidemia associated with type 2 diabetes mellitus ([place]) 03/28/2007   Other iron deficiency anemias 03/28/2007   PANIC ATTACK 03/28/2007   KERATOCONJUNCTIVITIS SICCA 03/28/2007   Mitral valve disorder 03/28/2007   ABNORMAL HEART RHYTHMS 03/28/2007   Raynaud's syndrome 03/28/2007   GERD 03/28/2007   IBS 03/28/2007   Rosacea 03/28/2007   PLANTAR FASCIITIS 03/28/2007   MIGRAINES, HX OF 03/28/2007    Past Medical History:  Diagnosis Date   Acute renal insufficiency 05/31/2014   pt not aware of this diagnosis   Anemia, iron deficiency    Anxiety    Bilateral lower extremity edema    a. uses torsemide   Cervical dysplasia    abnormal paps   Colon cancer screening 06/14/2014   Coronary artery disease    Cough 08/08/2012   Degenerative disk disease 11/19/2011   Depression    Diabetes mellitus type II    Diverticulosis    DJD (degenerative joint disease)    Drug rash 05/22/2011   Dry eyes    Dysrhythmia    metoprolol.   Edema    Elevated liver enzymes 03/21/2012   Encounter for routine gynecological examination 06/14/2014   ESOPHAGITIS 03/28/2007   Qualifier: Hospitalized for  By: Marcelino Scot CMA, [place] Bil[place]     Fibromyalgia    GASTRITIS 03/28/2007   Qualifier: History of  By: Marcelino Scot CMA, [place] Bilberry     GERD (gastroesophageal reflux disease)    Hemorrhoids    external   HLD (hyperlipidemia)    HNP (herniated nucleus pulposus) 11/1997   T6,7,8 with DJD   HTN (hypertension)    Hyperglycemia 05/13/2008   Qualifier: Diagnosis of  By: Glori Bickers MD, Carmell Austria    Hypothyroidism    Interstitial lung disease ([place])    pt not aware of this   Left ovarian cyst    x 3, rupture   Osteoarthritis    hands   Osteopenia    mild-11/01; improved 12/05   Other screening mammogram 08/18/2011   Palpitations    PERSONAL HISTORY ALLERGY UNSPEC MEDICINAL AGENT 03/18/2010   Qualifier: Diagnosis of  By: Glori Bickers MD, Carmell Austria    PONV (postoperative nausea and vomiting)    Raynaud's  disease    Recurrent HSV (herpes simplex virus)    lesions in nose or mouth with frequent ulcers. d/t meds and dry mouth   Rheumatoid arthritis ([place])    Rhinitis 03/21/2012   Routine general medical examination at a health care facility 03/28/2011    Family History  Problem Relation Age of Onset   Lung cancer Mother        + smoker   Coronary artery disease Mother        relatively young   Emphysema Father        + smoker   Lymphoma Sister    Heart disease Brother    Lymphoma Brother    Parkinson's disease Brother    Anemia Brother        aplastic    Diabetes Brother    Iron deficiency Daughter    Leukemia Son    Breast cancer Neg Hx    Past Surgical History:  Procedure Laterality Date   ABDOMINAL EXPLORATION SURGERY     ACDF  1991   BIOPSY OF SKIN SUBCUTANEOUS TISSUE AND/OR MUCOUS MEMBRANE Left 04/08/2021   Procedure: BIOPSY OF SKIN SUBCUTANEOUS TISSUE AND/OR MUCOUS MEMBRANE ( REMOVAL SKIN NODULE);  Surgeon: Katha Cabal,  MD;  Location: [facility] ORS;  Service: Vascular;  Laterality: Left;   COLONOSCOPY  08/2000   Diverticulosis; hemorrhoids   HAND SURGERY Left    left thumb. PIN REMOVED   LASIK     bilateral   Social History   Social History Narrative   Married1 Investment banker, corporate to dean at [employer] regularly exerciseDaily caffeine use: 2/day.   Lives with husband. Feels safe in her home.   Immunization History  Administered Date(s) Administered   Fluad Quad(high Dose 65+) 08/07/2020, 07/13/2022   Influenza Split 06/19/2011   Influenza Whole 08/01/2009   Influenza, High Dose Seasonal PF 08/14/2018, 07/03/2019   Influenza,inj,Quad PF,6+ Mos 06/14/2014, 08/25/2015, 07/26/2016, 08/19/2017   Moderna Sars-Covid-2 Vaccination 09/17/2020   PFIZER(Purple Top)SARS-COV-2 Vaccination 11/27/2019, 12/18/2019   Pneumococcal Conjugate-13 08/25/2015   Pneumococcal Polysaccharide-23 08/25/2016   Td 11/25/2003   Tdap 06/14/2014   Zoster, Live 09/16/2014     Objective: Vital  Signs: There were no vitals taken for this visit.   Physical Exam   Musculoskeletal Exam: ***  CDAI Exam: CDAI Score: -- Patient Global: --; Provider Global: -- Swollen: --; Tender: -- Joint Exam 10/28/2022   No joint exam has been documented for this visit   There is currently no information documented on the homunculus. Go to the Rheumatology activity and complete the homunculus joint exam.  Investigation: No additional findings.  Imaging: No results found.  Recent Labs: Lab Results  Component Value Date   WBC 8.2 09/16/2022   HGB 13.4 09/16/2022   PLT 396 09/16/2022   NA 137 09/16/2022   K 4.9 09/16/2022   CL 96 (L) 09/16/2022   CO2 28 09/16/2022   GLUCOSE 111 (H) 09/16/2022   BUN 23 09/16/2022   CREATININE 0.74 09/16/2022   BILITOT 0.3 09/16/2022   ALKPHOS 81 07/06/2022   AST 14 09/16/2022   ALT 10 09/16/2022   PROT 7.8 09/16/2022   ALBUMIN 4.2 07/06/2022   CALCIUM 9.7 09/16/2022   GFRAA 75 03/24/2021   QFTBGOLDPLUS NEGATIVE 02/03/2022    Speciality Comments: Arava- mood disorder  Procedures:  No procedures performed Allergies: Ceftin [cefuroxime], Clonidine derivatives, Erythromycin, Keflex [cephalexin], Penicillins, Sulfonamide derivatives, Ciprofloxacin, Fluoxetine hcl, Furosemide, Gabapentin, Hydroxychloroquine, Paroxetine, Pregabalin, Tetracycline, Venlafaxine, Etodolac, Macrobid [nitrofurantoin], Amitriptyline hcl, Atorvastatin, Benicar [olmesartan medoxomil], Cephalexin, Cetirizine hcl, Diovan [valsartan], and Naproxen sodium   Assessment / Plan:     Visit Diagnoses: No diagnosis found.  Orders: No orders of the defined types were placed in this encounter.  No orders of the defined types were placed in this encounter.   Face-to-face time spent with patient was *** minutes. Greater than 50% of time was spent in counseling and coordination of care.  Follow-Up Instructions: No follow-ups on file.   Earnestine Mealing, CMA  Note - This record  has been created using Editor, commissioning.  Chart creation errors have been sought, but may not always  have been located. Such creation errors do not reflect on  the standard of medical care.

## 2022-10-22 ENCOUNTER — Telehealth: Payer: Self-pay

## 2022-10-22 NOTE — Progress Notes (Signed)
Care Management & Coordination Services Pharmacy Team  Reason for Encounter: Appointment Reminder  Contacted patient on 10/22/2022  Hospital visits:  None in previous 6 months  Medications: Outpatient Encounter Medications as of 10/22/2022  Medication Sig   Acetaminophen (TYLENOL ARTHRITIS PAIN PO) Take 650 mg by mouth daily.   buPROPion (WELLBUTRIN XL) 150 MG 24 hr tablet TAKE 1 TABLET BY MOUTH DAILY   Chlorphen-Phenyleph-ASA (ALKA-SELTZER PLUS COLD PO) Take by mouth as needed. Just for colds   cholecalciferol (VITAMIN D3) 25 MCG (1000 UNIT) tablet Take 1,000 Units by mouth daily.   cyclobenzaprine (FLEXERIL) 5 MG tablet Take 5 mg by mouth daily as needed.   Diclofenac Sodium 1.5 % SOLN Place 2 mLs onto the skin 4 (four) times daily.   DULoxetine (CYMBALTA) 30 MG capsule TAKE 1 CAPSULE BY MOUTH EVERY DAY   esomeprazole (NEXIUM) 20 MG capsule TAKE 1 CAPSULE BY MOUTH DAILY   ezetimibe (ZETIA) 10 MG tablet Take 1 tablet (10 mg total) by mouth daily.   famotidine (PEPCID) 40 MG tablet TAKE 1 TABLET BY MOUTH DAILY   ferrous sulfate 325 (65 FE) MG EC tablet Take 325 mg by mouth daily with breakfast.   FLUAD QUADRIVALENT 0.5 ML injection  (Patient not taking: Reported on 09/16/2022)   glucose blood test strip One Touch Ultra stripts blue-To check sugar once daily and as needed for DM2 250.00   GUAIFENESIN CR PO Take by mouth as needed.   HYDROmorphone HCl (EXALGO) 12 MG T24A SR tablet Take 12 mg by mouth 2 (two) times daily.   hyoscyamine (LEVSIN SL) 0.125 MG SL tablet TAKE 1 TABLET BY MOUTH EVERY 4 HOURS AS NEEDED FOR CRAMPING.   ketoconazole (NIZORAL) 2 % shampoo SHAMPOO WITH A SMALL AMOUNT AS DIRECTED THREE TIMES A WEEK SHAMPOO SCALP 3 DAYS A WEEK, LET SHAMPOO SIT FOR 10 MINUTES AND RINSE OFF   leflunomide (ARAVA) 10 MG tablet Take 1 tab (10 mg) po daily x 2 weeks. If labs are stable, increase to 2 tabs (20 mg) po daily.   levothyroxine (SYNTHROID) 125 MCG tablet Take 1 tablet (125 mcg total)  by mouth daily.   LYSINE PO Take by mouth as needed. OTC for ulcer prophylaxis   Magnesium 400 MG CAPS Take by mouth daily.   metFORMIN (GLUCOPHAGE) 1000 MG tablet Take 1 tablet (1,000 mg total) by mouth 2 (two) times daily with a meal.   metoprolol succinate (TOPROL-XL) 50 MG 24 hr tablet TAKE 1 TABLET BY MOUTH TWICE  DAILY   naloxone (NARCAN) nasal spray 4 mg/0.1 mL SMARTSIG:Both Nares   ondansetron (ZOFRAN) 4 MG tablet Take 1 tablet (4 mg total) by mouth every 8 (eight) hours as needed for nausea or vomiting.   oxyCODONE (OXYCONTIN) 10 MG 12 hr tablet Take 10 mg by mouth 3 (three) times daily as needed. Beakthrough pain   predniSONE (DELTASONE) 1 MG tablet TAKE 2 TABLETS BY MOUTH ONCE WITH BREAKFAST , ALONG WITH '5MG'$  TO EQUAL '7MG'$  DAILY (Patient not taking: Reported on 09/16/2022)   predniSONE (DELTASONE) 10 MG tablet Take 1 tablet (10 mg total) by mouth daily with breakfast.   predniSONE (DELTASONE) 5 MG tablet Take 1 tablet (5 mg total) by mouth daily. (Patient taking differently: Take 10 mg by mouth daily.)   ramipril (ALTACE) 5 MG capsule TAKE 1 CAPSULE BY MOUTH DAILY   rosuvastatin (CRESTOR) 5 MG tablet TAKE 1 TABLET BY MOUTH TWICE  WEEKLY   Tocilizumab (ACTEMRA ACTPEN) 162 MG/0.9ML SOAJ Inject 162 mg  into the skin every 14 (fourteen) days.   No facility-administered encounter medications on file as of 10/22/2022.   Lab Results  Component Value Date/Time   HGBA1C 7.1 (H) 07/06/2022 09:08 AM   HGBA1C 6.6 (H) 03/23/2022 11:57 AM    BP Readings from Last 3 Encounters:  09/16/22 (!) 153/96  07/13/22 134/76  06/10/22 129/83    Unsuccessful attempt to reach patient. Left patient message reminding patient of appointment.  Mailbox full  Patient contacted to confirm telephone appointment with Charlene Brooke,   PharmD, on 10/27/2022 at 11:00.  Star Rating Drugs:  Medication:  Last Fill: Day Supply Metformin '1000mg'$  10/03/22 100 Ramipril '5mg'$   07/30/22 100 Rosuvastatin  '5mg'$  09/22/22 100   Care Gaps: Annual wellness visit in last year? Yes  If Diabetic: Last eye exam / retinopathy screening:UTD Last diabetic foot exam:UTD  Charlene Brooke, CPP notified  Avel Sensor, [place]  [phone]

## 2022-10-26 ENCOUNTER — Telehealth: Payer: Self-pay | Admitting: Internal Medicine

## 2022-10-26 MED ORDER — PAXLOVID (300/100) 20 X 150 MG & 10 X 100MG PO TBPK: 1.0000 | ORAL_TABLET | Freq: Two times a day (BID) | ORAL | 0 refills | Status: DC

## 2022-10-26 NOTE — Telephone Encounter (Signed)
PAXLOVID   Paxlovid (nirmatelvir 300/Ritonavir100) - BID x 5 days - for GFR >= 60-she had an normal renal function and November 2023.  During this time she should hold her statin   PLEASE CHECK MED LIST for the following issues. Please check the 2 different condition related to concomitant medications in alphabetical order  If Tina Berger with DOB 13-Jun-1951 is on any of the following Strong CYP3A inhibtors - this patient Tina Berger should withold these concomitant meds so they can start paxlovid stratight away. If taking any of these:  alfuzosin, amiodarone, clozapine, colchicine, dihydroergotamine, dronedarone, ergotamine, flecainide, lovastatin, lurasidone, methylergonovine, midazolam [oral], pethidine, pimozide, propafenone, propoxyphene, quinidine, ranolazine, sildenafil simvastatin, triazolam).   If   Tina Berger  with dob 10-24-1950 Is on any of these other strong CYP3A inducers then starting paxlovid should be delayed and the following meds should wash out first. These are dapalutamide, carbamazepine, phenobarbital, phenytoin, rifampin, St John's wort) - let me know immediately and we should delay starting paxlovid by some days even if he stops these medication.    PLEASE INFORM Tina Berger  OF FOLLOWING SIDE EFFECTS  Side effects - all < 5%  - skin rash (and veyr rare a conditon called TEN) - angiomedia  - myalgia - jaundice - high bP (1%) - loss of taste  - diarrhea -Rebound 25%     Current Outpatient Medications:    Acetaminophen (TYLENOL ARTHRITIS PAIN PO), Take 650 mg by mouth daily., Disp: , Rfl:    buPROPion (WELLBUTRIN XL) 150 MG 24 hr tablet, TAKE 1 TABLET BY MOUTH DAILY, Disp: 90 tablet, Rfl: 3   Chlorphen-Phenyleph-ASA (ALKA-SELTZER PLUS COLD PO), Take by mouth as needed. Just for colds, Disp: , Rfl:    cholecalciferol (VITAMIN D3) 25 MCG (1000 UNIT) tablet, Take 1,000 Units by mouth daily., Disp: , Rfl:    cyclobenzaprine (FLEXERIL) 5 MG  tablet, Take 5 mg by mouth daily as needed., Disp: , Rfl:    Diclofenac Sodium 1.5 % SOLN, Place 2 mLs onto the skin 4 (four) times daily., Disp: 3 Bottle, Rfl: 3   DULoxetine (CYMBALTA) 30 MG capsule, TAKE 1 CAPSULE BY MOUTH EVERY DAY, Disp: 90 capsule, Rfl: 1   esomeprazole (NEXIUM) 20 MG capsule, TAKE 1 CAPSULE BY MOUTH DAILY, Disp: 90 capsule, Rfl: 3   ezetimibe (ZETIA) 10 MG tablet, Take 1 tablet (10 mg total) by mouth daily., Disp: 90 tablet, Rfl: 3   famotidine (PEPCID) 40 MG tablet, TAKE 1 TABLET BY MOUTH DAILY, Disp: 90 tablet, Rfl: 3   ferrous sulfate 325 (65 FE) MG EC tablet, Take 325 mg by mouth daily with breakfast., Disp: , Rfl:    FLUAD QUADRIVALENT 0.5 ML injection, , Disp: , Rfl:    glucose blood test strip, One Touch Ultra stripts blue-To check sugar once daily and as needed for DM2 250.00, Disp: 100 each, Rfl: 3   GUAIFENESIN CR PO, Take by mouth as needed., Disp: , Rfl:    HYDROmorphone HCl (EXALGO) 12 MG T24A SR tablet, Take 12 mg by mouth 2 (two) times daily., Disp: , Rfl:    hyoscyamine (LEVSIN SL) 0.125 MG SL tablet, TAKE 1 TABLET BY MOUTH EVERY 4 HOURS AS NEEDED FOR CRAMPING., Disp: 270 tablet, Rfl: 1   ketoconazole (NIZORAL) 2 % shampoo, SHAMPOO WITH A SMALL AMOUNT AS DIRECTED THREE TIMES A WEEK SHAMPOO SCALP 3 DAYS A WEEK, LET SHAMPOO SIT FOR 10 MINUTES AND RINSE OFF,  Disp: 120 mL, Rfl: 2   leflunomide (ARAVA) 10 MG tablet, Take 1 tab (10 mg) po daily x 2 weeks. If labs are stable, increase to 2 tabs (20 mg) po daily., Disp: 42 tablet, Rfl: 0   levothyroxine (SYNTHROID) 125 MCG tablet, Take 1 tablet (125 mcg total) by mouth daily., Disp: 90 tablet, Rfl: 3   LYSINE PO, Take by mouth as needed. OTC for ulcer prophylaxis, Disp: , Rfl:    Magnesium 400 MG CAPS, Take by mouth daily., Disp: , Rfl:    metFORMIN (GLUCOPHAGE) 1000 MG tablet, Take 1 tablet (1,000 mg total) by mouth 2 (two) times daily with a meal., Disp: 180 tablet, Rfl: 3   metoprolol succinate (TOPROL-XL) 50 MG  24 hr tablet, TAKE 1 TABLET BY MOUTH TWICE  DAILY, Disp: 180 tablet, Rfl: 3   naloxone (NARCAN) nasal spray 4 mg/0.1 mL, SMARTSIG:Both Nares, Disp: , Rfl:    ondansetron (ZOFRAN) 4 MG tablet, Take 1 tablet (4 mg total) by mouth every 8 (eight) hours as needed for nausea or vomiting., Disp: 20 tablet, Rfl: 0   oxyCODONE (OXYCONTIN) 10 MG 12 hr tablet, Take 10 mg by mouth 3 (three) times daily as needed. Beakthrough pain, Disp: , Rfl:    predniSONE (DELTASONE) 1 MG tablet, TAKE 2 TABLETS BY MOUTH ONCE WITH BREAKFAST , ALONG WITH '5MG'$  TO EQUAL '7MG'$  DAILY (Patient not taking: Reported on 09/16/2022), Disp: 60 tablet, Rfl: 1   predniSONE (DELTASONE) 10 MG tablet, Take 1 tablet (10 mg total) by mouth daily with breakfast., Disp: 30 tablet, Rfl: 2   predniSONE (DELTASONE) 5 MG tablet, Take 1 tablet (5 mg total) by mouth daily. (Patient taking differently: Take 10 mg by mouth daily.), Disp: 30 tablet, Rfl: 2   ramipril (ALTACE) 5 MG capsule, TAKE 1 CAPSULE BY MOUTH DAILY, Disp: 90 capsule, Rfl: 3   rosuvastatin (CRESTOR) 5 MG tablet, TAKE 1 TABLET BY MOUTH TWICE  WEEKLY, Disp: 26 tablet, Rfl: 1   Tocilizumab (ACTEMRA ACTPEN) 162 MG/0.9ML SOAJ, Inject 162 mg into the skin every 14 (fourteen) days., Disp: 5.4 mL, Rfl: 0

## 2022-10-26 NOTE — Telephone Encounter (Signed)
Pt called stating that she tested positive to covid today. Stated that her spouse tested positive for covid yesterday 1/8 and she decided to take a test due to him being positive and that was when she found out she had it too.  Due to pt testing positive for covid, pt wants to know what is recommended. Dr. Chase Caller, please advise.

## 2022-10-26 NOTE — Telephone Encounter (Signed)
PT calling again because she had not heard back. Explained we were waiting for Dr. Alfonso Patten to advise. TY

## 2022-10-26 NOTE — Telephone Encounter (Signed)
Spoke with the pt and notified of response per MR  She verbalized understanding  Rx was sent to preferred pharm  Nothing further needed

## 2022-10-27 ENCOUNTER — Encounter: Payer: Medicare Other | Admitting: Pharmacist

## 2022-10-27 ENCOUNTER — Telehealth: Payer: Self-pay | Admitting: Pharmacist

## 2022-10-27 NOTE — Telephone Encounter (Signed)
Ok to initiate arava once her symptoms of covid-19 infection have completely resolved.

## 2022-10-27 NOTE — Telephone Encounter (Signed)
Care Management & Coordination Services Outreach Note  10/27/2022 Name: Tina Berger MRN: [id number] DOB: Jun 27, 1951  Referred by: Tower, [name], MD  Patient had a phone appointment scheduled with clinical pharmacist today.  An unsuccessful telephone outreach was attempted today. The patient was referred to the pharmacist for assistance with medications, care management and care coordination.   Patient will NOT be penalized in any way for missing a Care Management & Coordination Services appointment. The no-show fee does not apply.  If possible, a message was left to return call to: [phone] or to [facility]  Charlene Brooke, PharmD, BCACP Clinical Pharmacist [facility] at [facility] [phone]

## 2022-10-27 NOTE — Progress Notes (Unsigned)
Care Management & Coordination Services Pharmacy Note  10/27/2022 Name:  Tina Berger MRN:  [id number] DOB:  04-Aug-1951  Summary: -Reviewed medications; pt stopped taking hydroxychloroquine due to itching/concern for allergic reaction, she has not informed rheumatologist -Reviewed risks of chronic prednisone use   Recommendations/Changes made from today's visit: -Advised to follow up with rheumatology for next options for RA  Follow up plan: -Health Concierge will call patient *** -Pharmacist follow up televisit scheduled for *** -No PCP appt scheduled    Subjective: Tina Berger is an 72 y.o. year old female who is a primary patient of Tower, Tina Fanny, MD.  The care coordination team was consulted for assistance with disease management and care coordination needs.    Engaged with patient by telephone for follow up visit.  Patient Care Team: Tower, Tina Fanny, MD as PCP - General [name], [name], MD as Consulting Physician (Cardiology) Charlton Haws, [facility] as Pharmacist (Pharmacist)  Recent office visits: 07/15/22 Dr Glori Bickers OV: annual - Rx ezetimibe 10 mg. Reduce levothyroxine to 125 mcg, recheck TSH 3 months. A1c 7.1% - increase metformin to 1000 mg BID. Ordered DEXA.  Recent consult visits: 10/26/22 TE (pulmonology): covid positive. Rx Paxlovid.  09/16/22 PA Hazel Sams (Rheumatology): retry Jolee Ewing (she is back on cymbalta).   06/29/22 pt message (Rheum): RA flare - increase prednisone back to 10 mg daily.  06/10/22 PA Hazel Sams (Rheumatology): RA - intolerance to multiple medications. May be unable to further taper prednisone. Will try to reduce prednisone 1 mg per month until she reaches 5 mg. Remain on Actemra.  Hospital visits: None in previous 6 months   Objective:  Lab Results  Component Value Date   CREATININE 0.74 09/16/2022   BUN 23 09/16/2022   GFR 48.18 (L) 11/13/2020   EGFR 86 09/16/2022   GFRNONAA >60 11/07/2021   GFRAA 75 03/24/2021   NA 137  09/16/2022   K 4.9 09/16/2022   CALCIUM 9.7 09/16/2022   CO2 28 09/16/2022   GLUCOSE 111 (H) 09/16/2022    Lab Results  Component Value Date/Time   HGBA1C 7.1 (H) 07/06/2022 09:08 AM   HGBA1C 6.6 (H) 03/23/2022 11:57 AM   GFR 48.18 (L) 11/13/2020 08:54 AM   GFR 63.03 09/25/2020 09:34 AM    Last diabetic Eye exam:  Lab Results  Component Value Date/Time   HMDIABEYEEXA No Retinopathy 03/04/2022 12:00 AM    Last diabetic Foot exam:  Lab Results  Component Value Date/Time   HMDIABFOOTEX Done 03/18/2010 12:00 AM     Lab Results  Component Value Date   CHOL 181 07/06/2022   HDL 48 07/06/2022   LDLCALC 113 (H) 07/06/2022   LDLDIRECT 149.0 07/26/2016   TRIG 108 07/06/2022   CHOLHDL 3.8 07/06/2022       Latest Ref Rng & Units 09/16/2022    4:01 PM 07/06/2022    9:08 AM 06/10/2022    1:40 PM  Hepatic Function  Total Protein 6.1 - 8.1 g/dL 7.8  7.1  7.5   Albumin 3.8 - 4.8 g/dL  4.2    AST 10 - 35 U/L '14  15  21   '$ ALT 6 - 29 U/L '10  17  18   '$ Alk Phosphatase 44 - 121 IU/L  81    Total Bilirubin 0.2 - 1.2 mg/dL 0.3  0.4  0.4     Lab Results  Component Value Date/Time   TSH 0.197 (L) 07/06/2022 09:08 AM   TSH 3.46 03/23/2022 11:57  AM   FREET4 1.1 01/24/2007 09:41 AM       Latest Ref Rng & Units 09/16/2022    4:01 PM 07/06/2022    9:08 AM 06/10/2022    1:40 PM  CBC  WBC 3.8 - 10.8 Thousand/uL 8.2  9.1  8.8   Hemoglobin 11.7 - 15.5 g/dL 13.4  13.4  14.4   Hematocrit 35.0 - 45.0 % 40.3  40.0  43.6   Platelets 140 - 400 Thousand/uL 396  319  298     Lab Results  Component Value Date/Time   VD25OH 52.0 07/06/2022 09:08 AM   VD25OH 48.52 08/20/2020 09:51 AM   VD25OH 50.96 08/17/2019 09:32 AM   VITAMINB12 980 07/06/2022 09:08 AM   VITAMINB12 740 09/25/2020 09:34 AM    Clinical ASCVD: Yes  The 10-year ASCVD risk score (Arnett DK, et al., 2019) is: 51.9%   Values used to calculate the score:     Age: 72 years     Sex: Female     Is Non-Hispanic African American:  No     Diabetic: Yes     Tobacco smoker: Yes     Systolic Blood Pressure: 542 mmHg     Is BP treated: Yes     HDL Cholesterol: 48 mg/dL     Total Cholesterol: 181 mg/dL       07/13/2022    2:06 PM 05/05/2022    3:10 PM 03/23/2022   11:25 AM  Depression screen PHQ 2/9  Decreased Interest 0 0 0  Down, Depressed, Hopeless 0 0 0  PHQ - 2 Score 0 0 0  Altered sleeping 0 0   Tired, decreased energy 3 0   Change in appetite 3 0   Feeling bad or failure about yourself  0 0   Trouble concentrating 0 0   Moving slowly or fidgety/restless 3 0   Suicidal thoughts 0 0   PHQ-9 Score 9 0   Difficult doing work/chores Somewhat difficult Not difficult at all      Social History   Tobacco Use  Smoking Status Former   Packs/day: 1.00   Years: 5.00   Total pack years: 5.00   Types: Cigarettes   Quit date: 10/18/1968   Years since quitting: 54.0   Passive exposure: Never  Smokeless Tobacco Never  Tobacco Comments   quit over 40 years   BP Readings from Last 3 Encounters:  09/16/22 (!) 153/96  07/13/22 134/76  06/10/22 129/83   Pulse Readings from Last 3 Encounters:  09/16/22 86  07/13/22 97  06/10/22 84   Wt Readings from Last 3 Encounters:  09/16/22 185 lb 6.4 oz (84.1 kg)  07/13/22 187 lb 9.6 oz (85.1 kg)  06/10/22 186 lb 12.8 oz (84.7 kg)   BMI Readings from Last 3 Encounters:  09/16/22 35.03 kg/m  07/13/22 34.87 kg/m  06/10/22 34.17 kg/m    Allergies  Allergen Reactions   Ceftin [Cefuroxime] Swelling    Swelling, "legs turn blue".  Legs swelling, then blue, then a rash   Clonidine Derivatives     Swelling of lips   Erythromycin Swelling    Rash, swollen gums    Keflex [Cephalexin] Anaphylaxis    Chest was broken out in a rash. Lips were swelling. Took a few days to occur, but it kept getting worse.  Penicillins Anaphylaxis    Pt had a daughter with an allergy to this at a young age. Patient developed blisters anywhere her child touched her. Also, if she used the bathroom after daughter did while on keflex, it would cause a reaction for her. Lips and roof of mouth swelled up also.   Sulfonamide Derivatives Swelling    Rash, swollen gums, lips   Ciprofloxacin     REACTION: ? rash vs sun rxn. Rash    Fluoxetine Hcl     REACTION: stomach problems. Severe pain in abdomen. Could not function d/t pain   Furosemide Swelling    REACTION: swelling worsened in legs once taking   Gabapentin Other (See Comments)    REACTION: edema of feet. Unable to get shoes on   Hydroxychloroquine Itching    Pt report itching felt like "pre-anaphylaxis" she has had with other medications   Paroxetine     REACTION: weight gain (30 pound weight gain   Pregabalin     REACTION: swelling of feet and legs. Unable to get shoes on.   Tetracycline     REACTION:inflammed genitals.    Venlafaxine     REACTION: sweating profusely   Etodolac     REACTION: reaction not known   Macrobid [Nitrofurantoin]     REACTION: ? Lung problem    Amitriptyline Hcl     REACTION: sedating   Atorvastatin     REACTION: muscle twitch and pain   Benicar [Olmesartan Medoxomil]     Muscle pain    Cephalexin Hives and Rash   Cetirizine Hcl     REACTION: headache   Diovan [Valsartan]     Thought it made her feel confused   Naproxen Sodium Other (See Comments)    REACTION: edema of feet and legs.  Not good for ckd    Medications Reviewed Today     Reviewed by Su Monks (Physician Assistant Certified) on 31/54/00 at 1515  Med List Status: <None>   Medication Order Taking? Sig Documenting Provider Last Dose Status Informant  Acetaminophen (TYLENOL ARTHRITIS PAIN PO) [id number] Yes Take 650 mg by mouth daily. [provider] Taking Active   buPROPion (WELLBUTRIN XL) 150 MG 24 hr tablet  [id number] Yes TAKE 1 TABLET BY MOUTH DAILY Tower, Tina Fanny, MD Taking Active   Chlorphen-Phenyleph-ASA (ALKA-SELTZER PLUS COLD PO) [id number] Yes Take by mouth as needed. Just for colds [provider] Taking Active Self  cholecalciferol (VITAMIN D3) 25 MCG (1000 UNIT) tablet [id number] Yes Take 1,000 Units by mouth daily. [provider] Taking Active Self  cyclobenzaprine (FLEXERIL) 5 MG tablet [id number] Yes Take 5 mg by mouth daily as needed. [provider] Taking Active   Diclofenac Sodium 1.5 % SOLN [id number] Yes Place 2 mLs onto the skin 4 (four) times daily. Tower, Tina Fanny, MD Taking Active   DULoxetine (CYMBALTA) 30 MG capsule [id number] Yes TAKE 1 CAPSULE BY MOUTH EVERY DAY Tower, Tina Fanny, MD Taking Active   esomeprazole (NEXIUM) 20 MG capsule [id number] Yes TAKE 1 CAPSULE BY MOUTH DAILY Tower, Tina Fanny, MD Taking Active   ezetimibe (ZETIA) 10 MG tablet [id number] Yes Take 1 tablet (10 mg total) by mouth daily. Tower, Tina Fanny, MD Taking Active   famotidine (PEPCID) 40 MG tablet [id number] Yes TAKE 1 TABLET BY MOUTH DAILY Tower, Tina Fanny, MD Taking Active   ferrous sulfate 325 (65 FE) MG EC tablet [id number] Yes Take 325 mg by mouth daily with breakfast. [provider] Taking  Active   FLUAD QUADRIVALENT 0.5 ML injection [id number] No   Patient not taking: Reported on 09/16/2022   [provider] Not Taking Active   glucose blood test strip [id number] Yes One Touch Ultra stripts blue-To check sugar once daily and as needed for DM2 250.00 Tower, Tina Fanny, MD Taking Active   GUAIFENESIN CR PO [id number] Yes Take by mouth as needed. [provider] Taking Active   HYDROmorphone HCl (EXALGO) 12 MG T24A SR tablet [id number] Yes Take 12 mg by mouth 2 (two) times daily. [provider] Taking Active Self  hyoscyamine (LEVSIN SL) 0.125 MG SL tablet [id number] Yes TAKE 1 TABLET BY MOUTH EVERY 4 HOURS AS NEEDED FOR CRAMPING. Tower, Tina Fanny, MD Taking Active    ketoconazole (NIZORAL) 2 % shampoo [id number] Yes SHAMPOO WITH A SMALL AMOUNT AS DIRECTED THREE TIMES A WEEK SHAMPOO SCALP 3 DAYS A WEEK, LET SHAMPOO SIT FOR 10 MINUTES AND RINSE OFF Moye, [place], MD Taking Active   levothyroxine (SYNTHROID) 125 MCG tablet [id number] Yes Take 1 tablet (125 mcg total) by mouth daily. Tower, Tina Fanny, MD Taking Active   LYSINE PO [id number] Yes Take by mouth as needed. OTC for ulcer prophylaxis [provider] Taking Active   Magnesium 400 MG CAPS [id number] Yes Take by mouth daily. [provider] Taking Active   metFORMIN (GLUCOPHAGE) 1000 MG tablet [id number] Yes Take 1 tablet (1,000 mg total) by mouth 2 (two) times daily with a meal. Tower, Tina Fanny, MD Taking Active   metoprolol succinate (TOPROL-XL) 50 MG 24 hr tablet [id number] Yes TAKE 1 TABLET BY MOUTH TWICE  DAILY Tower, Tina Fanny, MD Taking Active   naloxone [facility]) nasal spray 4 mg/0.1 mL [id number] Yes SMARTSIG:Both Nares [provider] Taking Active   ondansetron (ZOFRAN) 4 MG tablet [id number] Yes Take 1 tablet (4 mg total) by mouth every 8 (eight) hours as needed for nausea or vomiting. Ofilia Neas, PA-C Taking Active   oxyCODONE (OXYCONTIN) 10 MG 12 hr tablet [id number] Yes Take 10 mg by mouth 3 (three) times daily as needed. [place] pain [provider] Taking Active Self  predniSONE (DELTASONE) 1 MG tablet [id number] No TAKE 2 TABLETS BY MOUTH ONCE WITH BREAKFAST , ALONG WITH '5MG'$  TO EQUAL '7MG'$  DAILY  Patient not taking: Reported on 09/16/2022   Bo Merino, MD Not Taking Active   predniSONE (DELTASONE) 5 MG tablet [id number] Yes Take 1 tablet (5 mg total) by mouth daily.  Patient taking differently: Take 10 mg by mouth daily.   Bo Merino, MD Taking Active   ramipril (ALTACE) 5 MG capsule [id number] Yes TAKE 1 CAPSULE BY MOUTH DAILY Tower, Tina Fanny, MD Taking Active   rosuvastatin (CRESTOR) 5 MG tablet [id number] Yes TAKE 1 TABLET BY MOUTH TWICE  WEEKLY  Tower, Tina Fanny, MD Taking Active   Tocilizumab (ACTEMRA ACTPEN) 162 MG/0.9ML SOAJ [id number] Yes Inject 162 mg into the skin every 14 (fourteen) days. Ofilia Neas, PA-C Taking Active             Patient Active Problem List   Diagnosis Date Noted   Dysuria 09/07/2021   Rheumatoid arthritis ([place]) 05/27/2021   Chronic venous insufficiency 05/23/2021   AVM (arteriovenous malformation) 04/04/2021   CAD (coronary artery disease) 01/22/2021   Primary osteoarthritis of both knees 01/12/2021   Positive ANA (antinuclear antibody) 11/17/2020   Joint pain 11/13/2020   Current use of proton pump inhibitor 08/26/2020   Estrogen deficiency 08/21/2018   TMJ (dislocation of  temporomandibular joint), initial encounter 05/05/2018   Anterior neck pain 05/05/2018   Chronic pain syndrome 11/09/2017   Chronic upper extremity pain [facility] Area [facility] Pain) (Bilateral) (L>R) 11/09/2017   Fibromyalgia syndrome 11/09/2017   Osteoarthritis 11/09/2017   Osteoarthritis of lumbar facet joint (Bilateral) 11/09/2017   Lumbar facet arthropathy (Bilateral) 11/09/2017   Lumbar facet syndrome (Bilateral) (L>R) 11/09/2017   Lumbar foraminal stenosis (multilevel) (Bilateral) 11/09/2017   DDD (degenerative disc disease), lumbar 11/09/2017   Thoracic levoscoliosis 11/09/2017   Thoracic facet syndrome (Bilateral) (L>R) 11/09/2017   Thoracic facet arthropathy (Bilateral) (R>L) 11/09/2017   DDD (degenerative disc disease), thoracic 11/09/2017   Thoracolumbar IVDD 11/09/2017   DDD (degenerative disc disease), cervical 11/09/2017   Osteoarthritis of cervical facet (Bilateral) 11/09/2017   Grade 1 Anterolisthesis of C7 over T1 11/09/2017   Cervical foraminal stenosis (Bilateral) 11/09/2017   History of fusion of cervical spine (C5-6 ACDF) 11/09/2017   Cervical facet syndrome (Bilateral) 11/09/2017   Disorder of skeletal system 11/09/2017   Cervical radiculitis (Bilateral) 11/09/2017   Lumbar Epidural lipomatosis  11/09/2017   Chronic sacroiliac joint pain (Bilateral) (L>R) 11/09/2017   Chronic hip pain (Bilateral) (L>R) 11/09/2017   Chronic upper back pain (Primary Area of Pain) (midline) 10/24/2017   Chronic neck pain (Secondary Area of Pain) (Bilateral)  (L>R) 10/24/2017   Chronic low back pain (Fourth Area of Pain) (Bilateral) (L>R) 10/24/2017   Chronic lower extremity pain (Fifth Area of Pain) (Bilateral) (L>R) 10/24/2017   Lumbar Grade 1 Retrolisthesis of L1-2 and L2-3 10/24/2017   Other long term (current) drug therapy 10/24/2017   Other specified health status 10/24/2017   Long term current use of opiate analgesic 10/24/2017   Long term prescription opiate use 10/24/2017   Opiate use 10/24/2017   DM type 2 (diabetes mellitus, type 2) ([place]) 06/14/2014   Pharmacologic therapy 06/14/2014   Pedal edema    Post-menopausal 06/23/2012   Lumbar disc disease with radiculopathy 11/18/2011   HTN (hypertension) 07/30/2011   Raynaud disease 07/30/2011   Obesity 07/30/2011   Routine general medical examination at a health care facility 03/28/2011   Palpitations 04/27/2010   Depression with anxiety 06/14/2008   Vitamin D deficiency 05/13/2008   ANXIETY 03/25/2008   Osteopenia 03/25/2008   Hypothyroidism 03/28/2007   Hyperlipidemia associated with type 2 diabetes mellitus ([place]) 03/28/2007   Other iron deficiency anemias 03/28/2007   PANIC ATTACK 03/28/2007   KERATOCONJUNCTIVITIS SICCA 03/28/2007   Mitral valve disorder 03/28/2007   ABNORMAL HEART RHYTHMS 03/28/2007   Raynaud's syndrome 03/28/2007   GERD 03/28/2007   IBS 03/28/2007   Rosacea 03/28/2007   PLANTAR FASCIITIS 03/28/2007   MIGRAINES, HX OF 03/28/2007    Immunization History  Administered Date(s) Administered   Fluad Quad(high Dose 65+) 08/07/2020, 07/13/2022   Influenza Split 06/19/2011   Influenza Whole 08/01/2009   Influenza, High Dose Seasonal PF 08/14/2018, 07/03/2019   Influenza,inj,Quad PF,6+ Mos 06/14/2014, 08/25/2015,  07/26/2016, 08/19/2017   Moderna Sars-Covid-2 Vaccination 09/17/2020   PFIZER(Purple Top)SARS-COV-2 Vaccination 11/27/2019, 12/18/2019   Pneumococcal Conjugate-13 08/25/2015   Pneumococcal Polysaccharide-23 08/25/2016   Td 11/25/2003   Tdap 06/14/2014   Zoster, Live 09/16/2014     Compliance/Adherence/Medication fill history: Care Gaps: Cologuard  Star-Rating Drugs: Rosuvastatin - PDC 100% Ramipril - PDC 96% Metformin - PDC 79%. LF 10/03/22 x 100ds  SDOH:  (Social Determinants of Health) assessments and interventions performed: {yes/no:20286} SDOH Interventions    Flowsheet Row Office Visit from 07/13/2022 in [place] at [place] from  05/05/2022 in [place] at [place] [facility] from 09/28/2021 in [place] at [place] from 08/20/2020 in [place] at [place] from 08/16/2019 in [place] at [place] Interventions -- Intervention Not Indicated -- -- --  Housing Interventions -- Intervention Not Indicated -- -- --  Transportation Interventions -- Intervention Not Indicated -- -- --  Depression Interventions/Treatment  Counseling -- -- PHQ2-9 Score <4 Follow-up Not Indicated PHQ2-9 Score <4 Follow-up Not Indicated  Financial Strain Interventions -- Intervention Not Indicated Intervention Not Indicated -- --  Physical Activity Interventions -- Intervention Not Indicated -- -- --  Stress Interventions -- Intervention Not Indicated -- -- --  Social Connections Interventions -- Intervention Not Indicated -- -- --      SDOH Screenings   Food Insecurity: No Food Insecurity (05/05/2022)  Housing: Low Risk  (05/05/2022)  Transportation Needs: No Transportation Needs (05/05/2022)  Alcohol Screen: Low Risk  (05/05/2022)  Depression (PHQ2-9): Medium Risk (07/13/2022)  Financial Resource Strain: Low Risk  (05/05/2022)   Physical Activity: Insufficiently Active (05/05/2022)  Social Connections: Moderately Isolated (05/05/2022)  Stress: No Stress Concern Present (05/05/2022)  Tobacco Use: Medium Risk (09/16/2022)    Medication Assistance: {MEDASSISTANCEINFO:25044}  Medication Access: Within the past 30 days, how often has patient missed a dose of medication? *** Is a pillbox or other method used to improve adherence? {YES/NO:21197} Factors that may affect medication adherence? {CHL DESC; BARRIERS:21522} Are meds synced by current pharmacy? {YES/NO:21197} Are meds delivered by current pharmacy? {YES/NO:21197} Does patient experience delays in picking up medications due to transportation concerns? {YES/NO:21197}  [facility] Reviewed: Is patient disadvantaged to use [facility]?: {YES/NO:21197} Current Rx insurance plan: *** Name and location of Current pharmacy:  [place], [address] [place] [address] Phone: [phone] Fax: [phone]  [id number] [address] [address]: [phone]: [address]: [phone]: [phone] [facility] services reviewed with patient today?: {YES/NO:21197} Patient requests to transfer care to [facility]?: {YES/NO:21197} Reason patient declined to change pharmacies: {US patient preference:27474}   Assessment/Plan  Hypertension (BP goal <140/90) -Controlled - per home readings -Current home BP readings: 120-135/70s (husband checks occasionally) -History of palpitations, CAD, bilateral edema followed by cardiology  -Current treatment: Metoprolol succinate 50 mg BID - Appropriate, Effective, Safe, Accessible Ramipril 5 mg daily -Appropriate, Effective, Safe, Accessible -Medications  previously tried: spironolactone, olmesartan, HCTZ, diltiazem, clonidine, torsemide -Educated on BP goals and benefits of medications for prevention of heart attack, stroke and kidney damage; -Counseled to monitor BP at home weekly -Recommended to continue current medication  Hyperlipidemia / CAD (LDL goal < 70) -Not ideally controlled - LDL 88 (03/2022) above goal; peak 163 in 2010.  -Pt reports prior intolerance to multiple statins. Rosuvastatin 5 mg twice weekly is the first one she has tolerated -Hx aortic atherosclerosis, CAD.  -Current treatment: Rosuvastatin 5 mg twice a week - Appropriate, Effective, Safe, Accessible Ezetimibe 10 mg daily  - *** -Medications previously tried: atorvastatin -Educated on Cholesterol goals;  -Recommended to continue current medication  Diabetes (A1c goal <7%) -Controlled - A1c 6.6% (03/2022) at goal -Current home glucose readings - she inquired  about CGM, discussed insurance coverage requirements/OOP cost. She states it would be unaffordable.  -Denies hypoglycemic/hyperglycemic symptoms -Current medications: Metformin 1000 mg BID - Appropriate, Effective, Safe, Accessible Testing supplies -Medications previously tried: none  -Educated on A1c and blood sugar goals; -Recommended to continue current medication  Depression/Anxiety (Goal: Improve mood) -Controlled - per patient report; She does not see a counselor currently -PHQ9: 0 (03/2022) - minimal depression -GAD7: reports ongoing anxiety, situational stress, no GAD completed -Current treatment: Bupropion 150 mg 24 hr daily (PM) -Appropriate, Effective, Safe, Accessible Duloxetine 30 mg daily - Appropriate, Effective, Safe, Accessible -Medications previously tried/failed: fluoxetine (GI pain), paroxetine (wt gain), venlafaxine (sweating), amitriptyline (sedation) -Educated on Benefits of medication for symptom control;Benefits of cognitive-behavioral therapy with or without  medication -Recommended to continue current medication  Rheumatoid Arthritis (Goal: Improve symptoms) -Not ideally controlled - pt stopped taking hydroxychloroquine due to side effects/concern for allergic reaction -Pt reports she will be able to transition off prednisone soon. -Follows with rheumatology and pain mgmt -Current treatment  Prednisone 10 mg daily -Appropriate, Effective, Query Safe Actemra (Tocilizumab) 162 mg q14 days - Appropriate, Effective, Safe, Accessible Leflunomide 10 mg daily - *** -Medications previously tried: leflunomide (mood changes) -Reviewed long term risks of chronic prednisone use; advised to follow up with rheumatology for next options -Recommended to continue current medication  Chronic pain (Goal: manage pain) -Controlled -Follows with pain mgmt (NP Arrie Eastern). Hx RA, DDD (lumbar, cervical, thoracic), osteoarthritis. -Current treatment  Hydromorphone 12 mg BID -Appropriate, Effective, Safe, Accessible Oxycodone 10 mg TID prn -Appropriate, Effective, Safe, Accessible Tylenol 650 mg PRN -Appropriate, Effective, Safe, Accessible Diclofenac 1.5% solution -Appropriate, Effective, Safe, Accessible Cyclobenzaprine 5 mg PRN -Appropriate, Effective, Safe, Accessible Naloxone nasal spray PRN -Medications previously tried: pregabalin, gabapentin, venlfaxine, naproxen, etodolac, amitriptyline, duloxetine -Recommended to continue current medication  Osteopenia (Goal Prevent fractures) -Controlled -Last DEXA Scan: 10/2018   T-Score total hip: -1.5  T-Score lumbar spine: +0.4  10-year probability of major osteoporotic fracture: 14.4%  10-year probability of hip fracture: 1.8% -Patient is not a candidate for pharmacologic treatment -Current treatment  Vitamin D 1000 IU daily -Appropriate, Effective, Safe, Accessible -Medications previously tried: n/a  -Recommend 1200 mg of calcium daily from dietary and supplemental sources. -Recommended to continue current  medication  Hypothyroidism (Goal: Improve symptoms, TSH T4 WNL) -Query controlled - TSH was suppressed at Sept CPE so levothyroxine was reduced, pt has not returned for repeat labwork yet -Current treatment  Levothyroxine 125 mcg daily -Appropriate, Effective, Safe, Accessible -Medications previously tried: none  -Recommended to continue current medication  GERD (Goal: Improve symptoms) -Controlled, pt reports severe symptoms with missed dose of esomeprazole. She takes it daily in morning and H2RA at night. -Current treatment  Esomeprazole 20 mg daily -Appropriate, Effective, Safe, Accessible Famotidine 40 mg HS -Appropriate, Effective, Safe, Accessible -Medications previously tried: none  -Recommended to continue current medication  OTC/Other Ferrous sulfate 325 mg - 1 tablet daily with vitamin C Hyoscyamine 0.125 mg SL - 1 PRN cramping Ondansetron 4 mg - 1 q8h PRN n/v (rare use) Lysine Magnesium 400 mg Mucinex [place] Maintenance -Vaccine gaps: none -Mammogram, Cologuard due   Charlene Brooke, PharmD, BCACP Clinical Pharmacist [facility] [phone]

## 2022-10-28 ENCOUNTER — Ambulatory Visit: Payer: Medicare Other | Admitting: Physician Assistant

## 2022-10-28 ENCOUNTER — Encounter: Payer: Self-pay | Admitting: Family Medicine

## 2022-10-28 DIAGNOSIS — F418 Other specified anxiety disorders: Secondary | ICD-10-CM

## 2022-10-28 DIAGNOSIS — Z79899 Other long term (current) drug therapy: Secondary | ICD-10-CM

## 2022-10-28 DIAGNOSIS — I10 Essential (primary) hypertension: Secondary | ICD-10-CM

## 2022-10-28 DIAGNOSIS — Z8639 Personal history of other endocrine, nutritional and metabolic disease: Secondary | ICD-10-CM

## 2022-10-28 DIAGNOSIS — N1831 Chronic kidney disease, stage 3a: Secondary | ICD-10-CM

## 2022-10-28 DIAGNOSIS — M797 Fibromyalgia: Secondary | ICD-10-CM

## 2022-10-28 DIAGNOSIS — E119 Type 2 diabetes mellitus without complications: Secondary | ICD-10-CM

## 2022-10-28 DIAGNOSIS — M5030 Other cervical disc degeneration, unspecified cervical region: Secondary | ICD-10-CM

## 2022-10-28 DIAGNOSIS — M5136 Other intervertebral disc degeneration, lumbar region: Secondary | ICD-10-CM

## 2022-10-28 DIAGNOSIS — M0609 Rheumatoid arthritis without rheumatoid factor, multiple sites: Secondary | ICD-10-CM

## 2022-10-28 DIAGNOSIS — E785 Hyperlipidemia, unspecified: Secondary | ICD-10-CM

## 2022-10-28 DIAGNOSIS — Z8774 Personal history of (corrected) congenital malformations of heart and circulatory system: Secondary | ICD-10-CM

## 2022-10-28 DIAGNOSIS — G8929 Other chronic pain: Secondary | ICD-10-CM

## 2022-10-28 DIAGNOSIS — I059 Rheumatic mitral valve disease, unspecified: Secondary | ICD-10-CM

## 2022-10-28 DIAGNOSIS — M170 Bilateral primary osteoarthritis of knee: Secondary | ICD-10-CM

## 2022-10-28 DIAGNOSIS — M5134 Other intervertebral disc degeneration, thoracic region: Secondary | ICD-10-CM

## 2022-10-28 DIAGNOSIS — G894 Chronic pain syndrome: Secondary | ICD-10-CM

## 2022-10-28 DIAGNOSIS — Z79891 Long term (current) use of opiate analgesic: Secondary | ICD-10-CM

## 2022-10-28 DIAGNOSIS — I83029 Varicose veins of left lower extremity with ulcer of unspecified site: Secondary | ICD-10-CM

## 2022-10-28 DIAGNOSIS — I7300 Raynaud's syndrome without gangrene: Secondary | ICD-10-CM

## 2022-10-28 DIAGNOSIS — M8589 Other specified disorders of bone density and structure, multiple sites: Secondary | ICD-10-CM

## 2022-10-28 DIAGNOSIS — Z8719 Personal history of other diseases of the digestive system: Secondary | ICD-10-CM

## 2022-10-28 DIAGNOSIS — J849 Interstitial pulmonary disease, unspecified: Secondary | ICD-10-CM

## 2022-10-28 DIAGNOSIS — Z981 Arthrodesis status: Secondary | ICD-10-CM

## 2022-10-29 MED ORDER — DULOXETINE HCL 60 MG PO CPEP: 60.0000 mg | ORAL_CAPSULE | Freq: Every day | ORAL | 3 refills | Status: DC

## 2022-11-19 ENCOUNTER — Telehealth: Payer: Self-pay

## 2022-11-19 DIAGNOSIS — Z79899 Other long term (current) drug therapy: Secondary | ICD-10-CM

## 2022-11-19 DIAGNOSIS — M0609 Rheumatoid arthritis without rheumatoid factor, multiple sites: Secondary | ICD-10-CM

## 2022-11-19 DIAGNOSIS — J849 Interstitial pulmonary disease, unspecified: Secondary | ICD-10-CM

## 2022-11-19 NOTE — Progress Notes (Signed)
Care Management & Coordination Services Pharmacy Team  Reason for Encounter: Appointment Reminder  Contacted patient to confirm telephone appointment with Charlene Brooke, PharmD , PharmD on 11/24/22 at 11;45. Spoke with patient on 11/19/2022   Do you have any problems getting your medications? No   What is your top health concern you would like to discuss at your upcoming visit? No concerns at this time  Have you seen any other providers since your last visit with PCP? Yes  Rheumatology,orthopedics   Hospital visits:  None in previous 6 months   Star Rating Drugs:  Medication:  Last Fill: Day Supply Metformin '1000mg'$  09/22/22 100 Rosuvastatin   10/03/22 100  Ramipril '5mg'$   11/02/22 100  Care Gaps: Annual wellness visit in last year? Yes  If Diabetic: Last eye exam / retinopathy screening:UTD Last diabetic foot exam:UTD   Charlene Brooke, PharmD notified  Avel Sensor, [place] Assistant [phone]

## 2022-11-22 MED ORDER — ACTEMRA ACTPEN 162 MG/0.9ML ~~LOC~~ SOAJ: 162.0000 mg | SUBCUTANEOUS | 0 refills | Status: DC

## 2022-11-22 NOTE — Telephone Encounter (Signed)
Next Visit: 12/06/2022  Last Visit: 09/16/2022  Last Fill: 09/16/2022  GQ:[id number] arthritis of multiple sites with negative rheumatoid factor   Current Dose per office note 09/16/2022: Actemra 162 mg subcutaneous injections every 14 days   Labs: 09/16/2022 CBC WNL Glucose is 111. Chloride is borderline low. Rest of CMP WNL.    TB Gold: 02/03/2022 Neg    Okay to refill Actemra?

## 2022-11-24 ENCOUNTER — Ambulatory Visit: Payer: Medicare Other | Admitting: Pharmacist

## 2022-11-24 NOTE — Progress Notes (Signed)
Care Management & Coordination Services Pharmacy Note  11/24/2022 Name:  Tina Berger MRN:  [id number] DOB:  09-26-1951  Summary: F/U visit -RA: PT recently restarted leflunomide in attempt to reduce prednisone use; previously she had to stop this med due to depressed mood side effects; today pt is tearful during our conversation, but is also committed to continuing leflunomide for adequate trial -HTN: BP has been elevated (last OV 153/96); pt is not checking at home; she affirms compliance with metoprolol and ramipril -HLD: LDL 113 (06/2022), ezetimibe was prescribed at last PCP appt; pt decided not to start taking this since she had "a lot going on" -HypoTH: TSH was 0.197 (06/2022) and LT4 dose was reduced, pt has not had TSH rechecked yet   Recommendations/Changes made from today's visit: -No med changes; discussed pt is overdue for PCP follow up, she wants to wait until after Rheum appts to schedule (March 2024) -Advised to monitor BP at home; call if consistently > 140/90 -Repeat lipid panel at next OV; if LDL still above goal, consider re-trial of ezetimibe -Repeat TSH at next OV  Follow up plan: -[place] will call patient 1 month for general update, reminder to schedule PCP appt -Pharmacist follow up televisit scheduled for 3 months -Rheumatology appt 12/06/22    Subjective: Tina Berger is an 72 y.o. year old female who is a primary patient of Tower, Tina Fanny, MD.  The care coordination team was consulted for assistance with disease management and care coordination needs.    Engaged with patient by telephone for follow up visit.  Recent office visits: 07/13/22 Dr Glori Bickers OV: annual - ordered DEXA. Rx Ezetimibe 10 mg. Reduce Levothyroxine to 125 mcg. Increase metformin to 1000 mg BID.  Recent consult visits: 09/16/22 PA [place] (Rheum): f/u [facility] visits: None in last 6 months   Objective:  Lab Results  Component Value Date   CREATININE 0.74 09/16/2022   BUN  23 09/16/2022   GFR 48.18 (L) 11/13/2020   EGFR 86 09/16/2022   GFRNONAA >60 11/07/2021   GFRAA 75 03/24/2021   NA 137 09/16/2022   K 4.9 09/16/2022   CALCIUM 9.7 09/16/2022   CO2 28 09/16/2022   GLUCOSE 111 (H) 09/16/2022    Lab Results  Component Value Date/Time   HGBA1C 7.1 (H) 07/06/2022 09:08 AM   HGBA1C 6.6 (H) 03/23/2022 11:57 AM   GFR 48.18 (L) 11/13/2020 08:54 AM   GFR 63.03 09/25/2020 09:34 AM    Last diabetic Eye exam:  Lab Results  Component Value Date/Time   HMDIABEYEEXA No Retinopathy 03/04/2022 12:00 AM    Last diabetic Foot exam:  Lab Results  Component Value Date/Time   HMDIABFOOTEX Done 03/18/2010 12:00 AM     Lab Results  Component Value Date   CHOL 181 07/06/2022   HDL 48 07/06/2022   LDLCALC 113 (H) 07/06/2022   LDLDIRECT 149.0 07/26/2016   TRIG 108 07/06/2022   CHOLHDL 3.8 07/06/2022       Latest Ref Rng & Units 09/16/2022    4:01 PM 07/06/2022    9:08 AM 06/10/2022    1:40 PM  Hepatic Function  Total Protein 6.1 - 8.1 g/dL 7.8  7.1  7.5   Albumin 3.8 - 4.8 g/dL  4.2    AST 10 - 35 U/L 14  15  21   $ ALT 6 - 29 U/L 10  17  18   $ Alk Phosphatase 44 - 121 IU/L  81    Total  Bilirubin 0.2 - 1.2 mg/dL 0.3  0.4  0.4     Lab Results  Component Value Date/Time   TSH 0.197 (L) 07/06/2022 09:08 AM   TSH 3.46 03/23/2022 11:57 AM   FREET4 1.1 01/24/2007 09:41 AM       Latest Ref Rng & Units 09/16/2022    4:01 PM 07/06/2022    9:08 AM 06/10/2022    1:40 PM  CBC  WBC 3.8 - 10.8 Thousand/uL 8.2  9.1  8.8   Hemoglobin 11.7 - 15.5 g/dL 13.4  13.4  14.4   Hematocrit 35.0 - 45.0 % 40.3  40.0  43.6   Platelets 140 - 400 Thousand/uL 396  319  298     Lab Results  Component Value Date/Time   VD25OH 52.0 07/06/2022 09:08 AM   VD25OH 48.52 08/20/2020 09:51 AM   VD25OH 50.96 08/17/2019 09:32 AM   VITAMINB12 980 07/06/2022 09:08 AM   VITAMINB12 740 09/25/2020 09:34 AM    Clinical ASCVD: Yes  The 10-year ASCVD risk score (Arnett DK, et al., 2019)  is: 51.9%   Values used to calculate the score:     Age: 72 years     Sex: Female     Is Non-Hispanic African American: No     Diabetic: Yes     Tobacco smoker: Yes     Systolic Blood Pressure: 0000000 mmHg     Is BP treated: Yes     HDL Cholesterol: 48 mg/dL     Total Cholesterol: 181 mg/dL       07/13/2022    2:06 PM 05/05/2022    3:10 PM 03/23/2022   11:25 AM  Depression screen PHQ 2/9  Decreased Interest 0 0 0  Down, Depressed, Hopeless 0 0 0  PHQ - 2 Score 0 0 0  Altered sleeping 0 0   Tired, decreased energy 3 0   Change in appetite 3 0   Feeling bad or failure about yourself  0 0   Trouble concentrating 0 0   Moving slowly or fidgety/restless 3 0   Suicidal thoughts 0 0   PHQ-9 Score 9 0   Difficult doing work/chores Somewhat difficult Not difficult at all      Social History   Tobacco Use  Smoking Status Former   Packs/day: 1.00   Years: 5.00   Total pack years: 5.00   Types: Cigarettes   Quit date: 10/18/1968   Years since quitting: 54.1   Passive exposure: Never  Smokeless Tobacco Never  Tobacco Comments   quit over 40 years   BP Readings from Last 3 Encounters:  09/16/22 (!) 153/96  07/13/22 134/76  06/10/22 129/83   Pulse Readings from Last 3 Encounters:  09/16/22 86  07/13/22 97  06/10/22 84   Wt Readings from Last 3 Encounters:  09/16/22 185 lb 6.4 oz (84.1 kg)  07/13/22 187 lb 9.6 oz (85.1 kg)  06/10/22 186 lb 12.8 oz (84.7 kg)   BMI Readings from Last 3 Encounters:  09/16/22 35.03 kg/m  07/13/22 34.87 kg/m  06/10/22 34.17 kg/m    Allergies  Allergen Reactions   Ceftin [Cefuroxime] Swelling    Swelling, "legs turn blue".  Legs swelling, then blue, then a rash   Clonidine Derivatives     Swelling of lips   Erythromycin Swelling    Rash, swollen gums    Keflex [Cephalexin] Anaphylaxis    Chest was broken out in a rash. Lips were swelling. Took a few days to occur, but  it kept getting worse.                                                                                                                                                                                                    Penicillins Anaphylaxis    Pt had a daughter with an allergy to this at a young age. Patient developed blisters anywhere her child touched her. Also, if she used the bathroom after daughter did while on keflex, it would cause a reaction for her. Lips and roof of mouth swelled up also.   Sulfonamide Derivatives Swelling    Rash, swollen gums, lips   Ciprofloxacin     REACTION: ? rash vs sun rxn. Rash    Fluoxetine Hcl     REACTION: stomach problems. Severe pain in abdomen. Could not function d/t pain   Furosemide Swelling    REACTION: swelling worsened in legs once taking   Gabapentin Other (See Comments)    REACTION: edema of feet. Unable to get shoes on   Hydroxychloroquine Itching    Pt report itching felt like "pre-anaphylaxis" she has had with other medications   Paroxetine     REACTION: weight gain (30 pound weight gain   Pregabalin     REACTION: swelling of feet and legs. Unable to get shoes on.   Tetracycline     REACTION:inflammed genitals.    Venlafaxine     REACTION: sweating profusely   Etodolac     REACTION: reaction not known   Macrobid [Nitrofurantoin]     REACTION: ? Lung problem    Amitriptyline Hcl     REACTION: sedating   Atorvastatin     REACTION: muscle twitch and pain   Benicar [Olmesartan Medoxomil]     Muscle pain    Cephalexin Hives and Rash   Cetirizine Hcl     REACTION: headache   Diovan [Valsartan]     Thought it made her feel confused   Naproxen Sodium Other (See Comments)    REACTION: edema of feet and legs.  Not good for ckd    Medications Reviewed Today     Reviewed by Charlton Haws, [facility] (Pharmacist) on 11/26/22 at 1146  Med List Status: <None>   Medication Order Taking? Sig Documenting Provider Last Dose Status Informant  Acetaminophen (TYLENOL ARTHRITIS PAIN PO) [id number] Yes Take  650 mg by mouth daily. [provider] Taking Active   buPROPion (WELLBUTRIN XL) 150 MG 24 hr tablet [id number] Yes TAKE 1 TABLET BY MOUTH DAILY Tower, Tina Fanny, MD Taking Active   Chlorphen-Phenyleph-ASA (ALKA-SELTZER PLUS COLD PO)  [id number] Yes Take by mouth as needed. Just for colds [provider] Taking Active Self  cholecalciferol (VITAMIN D3) 25 MCG (1000 UNIT) tablet [id number] Yes Take 1,000 Units by mouth daily. [provider] Taking Active Self  cyclobenzaprine (FLEXERIL) 5 MG tablet [id number] Yes Take 5 mg by mouth daily as needed. [provider] Taking Active   Diclofenac Sodium 1.5 % SOLN [id number] Yes Place 2 mLs onto the skin 4 (four) times daily. Tower, Tina Fanny, MD Taking Active   DULoxetine (CYMBALTA) 60 MG capsule [id number] Yes Take 1 capsule (60 mg total) by mouth daily. Tower, Tina Fanny, MD Taking Active   esomeprazole (NEXIUM) 20 MG capsule [id number] Yes TAKE 1 CAPSULE BY MOUTH DAILY Tower, Tina Fanny, MD Taking Active   famotidine (PEPCID) 40 MG tablet [id number] Yes TAKE 1 TABLET BY MOUTH DAILY Tower, Tina Fanny, MD Taking Active   ferrous sulfate 325 (65 FE) MG EC tablet [id number] Yes Take 325 mg by mouth daily with breakfast. [provider] Taking Active   glucose blood test strip [id number] Yes One Touch Ultra stripts blue-To check sugar once daily and as needed for DM2 250.00 Tower, Tina Fanny, MD Taking Active   GUAIFENESIN CR PO [id number] Yes Take by mouth as needed. [provider] Taking Active   HYDROmorphone HCl (EXALGO) 12 MG T24A SR tablet [id number] Yes Take 12 mg by mouth 2 (two) times daily. [provider] Taking Active Self  hyoscyamine (LEVSIN SL) 0.125 MG SL tablet [id number] Yes TAKE 1 TABLET BY MOUTH EVERY 4 HOURS AS NEEDED FOR CRAMPING. Tower, Tina Fanny, MD Taking Active   ketoconazole (NIZORAL) 2 % shampoo [id number] Yes SHAMPOO WITH A SMALL AMOUNT AS DIRECTED THREE TIMES A WEEK SHAMPOO SCALP 3 DAYS A WEEK, LET  SHAMPOO SIT FOR 10 MINUTES AND RINSE OFF Moye, [place], MD Taking Active   leflunomide (ARAVA) 10 MG tablet [id number] Yes Take 1 tab (10 mg) po daily x 2 weeks. If labs are stable, increase to 2 tabs (20 mg) po daily. Ofilia Neas, PA-C Taking Active   levothyroxine (SYNTHROID) 125 MCG tablet [id number] Yes Take 1 tablet (125 mcg total) by mouth daily. Tower, Tina Fanny, MD Taking Active   LYSINE PO [id number] Yes Take by mouth as needed. OTC for ulcer prophylaxis [provider] Taking Active   Magnesium 400 MG CAPS [id number] Yes Take by mouth daily. [provider] Taking Active   metFORMIN (GLUCOPHAGE) 1000 MG tablet [id number] Yes Take 1 tablet (1,000 mg total) by mouth 2 (two) times daily with a meal. Tower, Tina Fanny, MD Taking Active   metoprolol succinate (TOPROL-XL) 50 MG 24 hr tablet [id number] Yes TAKE 1 TABLET BY MOUTH TWICE  DAILY Tower, Tina Fanny, MD Taking Active   naloxone [facility]) nasal spray 4 mg/0.1 mL [id number] Yes SMARTSIG:Both Nares [provider] Taking Active   ondansetron (ZOFRAN) 4 MG tablet [id number] Yes Take 1 tablet (4 mg total) by mouth every 8 (eight) hours as needed for nausea or vomiting. Ofilia Neas, PA-C Taking Active   oxyCODONE (OXYCONTIN) 10 MG 12 hr tablet [id number] Yes Take 10 mg by mouth 3 (three) times daily as needed. [place] pain [provider] Taking Active Self  predniSONE (DELTASONE) 1 MG tablet [id number] No TAKE 2 TABLETS BY MOUTH ONCE WITH BREAKFAST , ALONG WITH 5MG TO EQUAL 7MG DAILY  Patient not taking: Reported on 09/16/2022   Bo Merino, MD Not Taking Active   predniSONE (DELTASONE) 10 MG tablet [id number]  Yes Take 1 tablet (10 mg total) by mouth daily with breakfast. Ofilia Neas, PA-C Taking Active   predniSONE (DELTASONE) 5 MG tablet [id number] No Take 1 tablet (5 mg total) by mouth daily.  Patient not taking: Reported on 11/24/2022   Bo Merino, MD Not Taking Active   ramipril (ALTACE) 5 MG  capsule [id number] Yes TAKE 1 CAPSULE BY MOUTH DAILY Tower, Tina Fanny, MD Taking Active   rosuvastatin (CRESTOR) 5 MG tablet [id number] Yes TAKE 1 TABLET BY MOUTH TWICE  WEEKLY Tower, Tina Fanny, MD Taking Active   tiZANidine (ZANAFLEX) 4 MG tablet [id number] Yes Take 4 mg by mouth every 8 (eight) hours as needed for muscle spasms. [provider] Taking Active   Tocilizumab (ACTEMRA ACTPEN) 162 MG/0.9ML SOAJ [id number] Yes Inject 162 mg into the skin every 14 (fourteen) days. Bo Merino, MD Taking Active             SDOH:  (Social Determinants of Health) assessments and interventions performed: No SDOH Interventions    Flowsheet Row Office Visit from 07/13/2022 in [place] at [place] from 05/05/2022 in [place] at [place] Management from 09/28/2021 in [place] at [place] from 08/20/2020 in [place] at [place] from 08/16/2019 in [place] at [place] Interventions -- Intervention Not Indicated -- -- --  Housing Interventions -- Intervention Not Indicated -- -- --  Transportation Interventions -- Intervention Not Indicated -- -- --  Depression Interventions/Treatment  Counseling -- -- PHQ2-9 Score <4 Follow-up Not Indicated PHQ2-9 Score <4 Follow-up Not Indicated  Financial Strain Interventions -- Intervention Not Indicated Intervention Not Indicated -- --  Physical Activity Interventions -- Intervention Not Indicated -- -- --  Stress Interventions -- Intervention Not Indicated -- -- --  Social Connections Interventions -- Intervention Not Indicated -- -- --       Medication Assistance: None required.  Patient affirms current coverage meets needs.  Medication Access: Within the past 30 days, how often has patient missed a dose of medication? 0 Is  a pillbox or other method used to improve adherence? Yes  Factors that may affect medication adherence? adverse effects of medications Are meds synced by current pharmacy? No  Are meds delivered by current pharmacy? No  Does patient experience delays in picking up medications due to transportation concerns? No   [facility] Services Reviewed: Is patient disadvantaged to use [facility]?: Yes  Current Rx insurance plan: [facility] Name and location of Current pharmacy:  [facility] [place] [place] [address] Phone: [phone] Fax: [phone]  [id number] [place]: [phone]: [phone] [facility] services reviewed with patient today?: No  Patient requests to transfer care to [facility]?: No  Reason patient declined to change pharmacies: Disadvantaged due to insurance/mail order  Compliance/Adherence/Medication fill history: Care Gaps: Cologuard (due 08/2019)  Star-Rating Drugs: Rosuvastatin - PDC 100% Ramipril - PDC 100% Metformin - PDC 100%    ASSESSMENT / PLAN  Hypertension (BP goal <140/90) -Uncontrolled - per recent OV last BP 153/96; pt is not checking at home -Current home BP readings: n/a -History of palpitations, CAD, bilateral edema followed by cardiology  -Current  treatment: Metoprolol succinate 50 mg BID - Appropriate, Query Effective Ramipril 5 mg daily -Appropriate, Query Effective -Medications previously tried: spironolactone, olmesartan, HCTZ, diltiazem, clonidine, torsemide -Educated on BP goals and benefits of medications for prevention of heart attack, stroke and kidney damage; -Counseled to monitor BP at home 3x weekly; call provider if persistently > 140/90 -Recommended to continue current medication  Hyperlipidemia / CAD (LDL goal < 70) -Not ideally controlled - LDL 113  (06/2022) above goal; peak 163 in 2010; she did not end up starting ezetimibe after last PCP visit after reading about side effects  -Pt reports prior intolerance to multiple statins. Rosuvastatin 5 mg twice weekly is the first one she has tolerated -Hx aortic atherosclerosis, CAD.  -Current treatment: Rosuvastatin 5 mg twice a week - Appropriate, Effective, Safe, Accessible Ezetimibe 10 mg daily - not taking -Medications previously tried: atorvastatin -Educated on Cholesterol goals; benefits of statin/ezetimibe; discussed it is reasonable to wait until after next lipid panel to consider ezetimibe again -Recommended to continue current medication  Diabetes (A1c goal <7%) -Controlled - A1c 7.1% (06/2022) adequate -Current home glucose readings - she checks BG 3x  week due to sore fingers; she does not write down numbers  -she inquired about CGM, discussed insurance coverage requirements/OOP cost. She states it would be unaffordable.  -Denies hypoglycemic/hyperglycemic symptoms -Current medications: Metformin 1000 mg BID - Appropriate, Effective, Safe, Accessible Testing supplies -Medications previously tried: none  -Educated on A1c and blood sugar goals; advised to write down blood sugars -Recommended to continue current medication; repeat A1c at next OV  Depression/Anxiety (Goal: Improve mood) -Not ideally controlled - per patient report she is having a lot of crying spells since restarting leflunomide, she had this issue the last time she tried leflunomide but she is committed to an adequate trial period -PHQ9: 0 (03/2022) - minimal depression -GAD7: reports ongoing anxiety, situational stress, no GAD completed -Current treatment: Bupropion 150 mg 24 hr daily (PM) -Appropriate, Effective, Safe, Accessible Duloxetine 60 mg daily - Appropriate, Effective, Safe, Accessible. -Medications previously tried/failed: fluoxetine (GI pain), paroxetine (wt gain), venlafaxine (sweating), amitriptyline  (sedation) -Educated on Benefits of medication for symptom control;Benefits of cognitive-behavioral therapy with or without medication -Recommended to continue current medication  Rheumatoid Arthritis (Goal: Improve symptoms) -Stable - pt reports she recently restarted leflunomide in attempt to reduce prednisone dose, she has f/u with rheumatology later this month -Follows with rheumatology and pain mgmt -Current treatment  Prednisone 6 mg daily -Appropriate, Effective, Query Safe Actemra (Tocilizumab) 162 mg q14 days - Appropriate, Effective, Safe, Accessible Leflunomide 10 mg daily - Appropriate, Query Effective -Medications previously tried: leflunomide (mood changes) -Reviewed long term risks of chronic prednisone use; -Recommended to continue current medication  Chronic pain (Goal: manage pain) -Controlled -Follows with pain mgmt (NP Arrie [facility]). Hx RA, DDD (lumbar, cervical, thoracic), osteoarthritis. -Current treatment  Hydromorphone 12 mg BID -Appropriate, Effective, Safe, Accessible Oxycodone 10 mg TID prn -Appropriate, Effective, Safe, Accessible Tylenol 650 mg PRN -Appropriate, Effective, Safe, Accessible Diclofenac 1.5% solution -Appropriate, Effective, Safe, Accessible Cyclobenzaprine 5 mg PRN -Appropriate, Effective, Safe, Accessible Duloxetine 60 mg daily - Appropriate, Effective, Safe, Accessible Naloxone nasal spray PRN - Appropriate, Effective, Safe, Accessible -Medications previously tried: pregabalin, gabapentin, venlfaxine, naproxen, etodolac, amitriptyline -Recommended to continue current medication  Osteopenia (Goal Prevent fractures) -Controlled -Last DEXA Scan: 10/2018   T-Score total hip: -1.5  T-Score lumbar spine: +0.4  10-year probability of major osteoporotic fracture: 14.4%  10-year probability of hip fracture: 1.8% -Patient is not  a candidate for pharmacologic treatment -Current treatment  Vitamin D 1000 IU daily -Appropriate, Effective, Safe,  Accessible -Medications previously tried: n/a  -Recommend 1200 mg of calcium daily from dietary and supplemental sources. -Recommended to continue current medication  Hypothyroidism (Goal: Improve symptoms, TSH T4 WNL) -Query Controlled - TSH 0.197 (06/2022), dose was reduced and pt has not had TSH rechecked yet -Current treatment  Levothyroxine 125 mcg daily -Appropriate, Effective, Safe, Accessible -Medications previously tried: none  -Recommended to continue current medication; recheck TSH at next OV  GERD (Goal: Improve symptoms) -Controlled, pt reports severe symptoms with missed dose of esomeprazole. She takes it daily in morning and H2 at night. -Current treatment  Esomeprazole 20 mg daily -Appropriate, Effective, Safe, Accessible Famotidine 40 mg HS -Appropriate, Effective, Safe, Accessible -Medications previously tried: none  -Recommended to continue current medication  OTC/Other Ferrous sulfate 325 mg - 1 tablet daily with vitamin C Hyoscyamine 0.125 mg SL - 1 PRN cramping Ondansetron 4 mg - 1 q8h PRN n/v (rare use) Lysine Magnesium 400 mg Mucinex [place] Maintenance -Vaccine gaps: none -Mammogram, Cologuard due   Charlene Brooke, PharmD, BCACP Clinical Pharmacist [facility] at [facility] [phone]

## 2022-11-26 ENCOUNTER — Other Ambulatory Visit: Payer: Self-pay | Admitting: Family Medicine

## 2022-11-26 NOTE — Patient Instructions (Signed)
Visit Information  Phone number for Pharmacist: [phone]  Thank you for meeting with me to discuss your medications! Below is a summary of what we talked about during the visit:   Recommendations/Changes made from today's visit: -No med changes; discussed pt is overdue for PCP follow up, she wants to wait until after Rheum appts to schedule (March 2024) -Advised to monitor BP at home; call if consistently > 140/90 -Repeat lipid panel at next OV; if LDL still above goal, consider re-trial of ezetimibe -Repeat TSH at next OV  Follow up plan: -[place] will call patient 1 month for general update, reminder to schedule PCP appt -Pharmacist follow up televisit scheduled for 3 months -Rheumatology appt 12/06/22   Charlene Brooke, PharmD, BCACP Clinical Pharmacist [place] at [facility] [phone]

## 2022-11-27 ENCOUNTER — Other Ambulatory Visit: Payer: Self-pay | Admitting: Rheumatology

## 2022-11-29 NOTE — Telephone Encounter (Signed)
Next Visit: 10/28/2022   Last Visit: 09/16/2022   Last Fill: 09/20/2022  Dx: Rheumatoid arthritis of multiple sites with negative rheumatoid factor   Current Dose per office note on 09/16/2022: prednisone 10 mg daily   Okay to refill Prednisone?

## 2022-11-29 NOTE — Telephone Encounter (Signed)
Please clarify if she is taking 5 or 10 mg of prednisone daily

## 2022-11-29 NOTE — Telephone Encounter (Signed)
Attempted to contact the patient by phone to verify dose of Prednisone. Unable to leave a voicemail, mailbox is full. Sent message via my chart.

## 2022-11-30 NOTE — Telephone Encounter (Signed)
Patient verified she is taking Prednisone 10 mg daily.

## 2022-12-03 ENCOUNTER — Telehealth: Payer: Self-pay | Admitting: Pharmacist

## 2022-12-03 NOTE — Telephone Encounter (Signed)
[place] for clarification on patient's enrollment status for Actemra patient assistance. Per rep, patient's eligibility ended on 10/28/2022. She has to call [employer] to reverify her information/ She will have to meet new program criteria. Rep states they've tried to call patient multiple times and texted her with no response since Oct 2023. Our office did not receive any reverification documents but either way - pt has to provide her specific income now to requalify for program.  I attempted to conference call patient while I had [place] on the phone, but patient's phone went straight to VM and her VM box is fill. MyChart message has been sent to patient.  Phone: [phone], option 6  Knox Saliva, PharmD, MPH, BCPS, CPP Clinical Pharmacist (Rheumatology and Pulmonology)

## 2022-12-06 ENCOUNTER — Ambulatory Visit: Payer: Medicare Other | Admitting: Physician Assistant

## 2022-12-09 ENCOUNTER — Other Ambulatory Visit: Payer: Self-pay | Admitting: *Deleted

## 2022-12-09 DIAGNOSIS — Z79899 Other long term (current) drug therapy: Secondary | ICD-10-CM

## 2022-12-09 DIAGNOSIS — J849 Interstitial pulmonary disease, unspecified: Secondary | ICD-10-CM

## 2022-12-09 DIAGNOSIS — M0609 Rheumatoid arthritis without rheumatoid factor, multiple sites: Secondary | ICD-10-CM

## 2022-12-14 ENCOUNTER — Other Ambulatory Visit: Payer: Self-pay | Admitting: Physician Assistant

## 2022-12-14 DIAGNOSIS — E119 Type 2 diabetes mellitus without complications: Secondary | ICD-10-CM | POA: Diagnosis not present

## 2022-12-14 DIAGNOSIS — I10 Essential (primary) hypertension: Secondary | ICD-10-CM | POA: Diagnosis not present

## 2022-12-23 DIAGNOSIS — M5137 Other intervertebral disc degeneration, lumbosacral region: Secondary | ICD-10-CM | POA: Diagnosis not present

## 2022-12-23 DIAGNOSIS — G894 Chronic pain syndrome: Secondary | ICD-10-CM | POA: Diagnosis not present

## 2022-12-23 DIAGNOSIS — K5903 Drug induced constipation: Secondary | ICD-10-CM | POA: Diagnosis not present

## 2022-12-23 DIAGNOSIS — Z79891 Long term (current) use of opiate analgesic: Secondary | ICD-10-CM | POA: Diagnosis not present

## 2022-12-23 DIAGNOSIS — M961 Postlaminectomy syndrome, not elsewhere classified: Secondary | ICD-10-CM | POA: Diagnosis not present

## 2022-12-23 DIAGNOSIS — M4725 Other spondylosis with radiculopathy, thoracolumbar region: Secondary | ICD-10-CM | POA: Diagnosis not present

## 2022-12-23 DIAGNOSIS — M5134 Other intervertebral disc degeneration, thoracic region: Secondary | ICD-10-CM | POA: Diagnosis not present

## 2022-12-24 ENCOUNTER — Other Ambulatory Visit: Payer: Self-pay | Admitting: Family Medicine

## 2022-12-26 ENCOUNTER — Other Ambulatory Visit: Payer: Self-pay

## 2022-12-26 ENCOUNTER — Emergency Department: Payer: Medicare Other

## 2022-12-26 ENCOUNTER — Emergency Department
Admission: EM | Admit: 2022-12-26 | Discharge: 2022-12-26 | Disposition: A | Discharge status: Home / Self Care | Payer: Medicare Other | Attending: Emergency Medicine | Admitting: Emergency Medicine

## 2022-12-26 ENCOUNTER — Encounter: Payer: Self-pay | Admitting: Radiology

## 2022-12-26 DIAGNOSIS — I700 Atherosclerosis of aorta: Secondary | ICD-10-CM | POA: Insufficient documentation

## 2022-12-26 DIAGNOSIS — N289 Disorder of kidney and ureter, unspecified: Secondary | ICD-10-CM | POA: Diagnosis not present

## 2022-12-26 DIAGNOSIS — D649 Anemia, unspecified: Secondary | ICD-10-CM | POA: Insufficient documentation

## 2022-12-26 DIAGNOSIS — K5730 Diverticulosis of large intestine without perforation or abscess without bleeding: Secondary | ICD-10-CM | POA: Diagnosis not present

## 2022-12-26 DIAGNOSIS — S40022A Contusion of left upper arm, initial encounter: Secondary | ICD-10-CM | POA: Insufficient documentation

## 2022-12-26 DIAGNOSIS — W2203XA Walked into furniture, initial encounter: Secondary | ICD-10-CM | POA: Insufficient documentation

## 2022-12-26 DIAGNOSIS — S301XXA Contusion of abdominal wall, initial encounter: Secondary | ICD-10-CM | POA: Insufficient documentation

## 2022-12-26 DIAGNOSIS — W19XXXA Unspecified fall, initial encounter: Secondary | ICD-10-CM

## 2022-12-26 DIAGNOSIS — M069 Rheumatoid arthritis, unspecified: Secondary | ICD-10-CM | POA: Insufficient documentation

## 2022-12-26 DIAGNOSIS — S5002XA Contusion of left elbow, initial encounter: Secondary | ICD-10-CM | POA: Diagnosis not present

## 2022-12-26 DIAGNOSIS — Y92019 Unspecified place in single-family (private) house as the place of occurrence of the external cause: Secondary | ICD-10-CM | POA: Diagnosis not present

## 2022-12-26 DIAGNOSIS — T148XXA Other injury of unspecified body region, initial encounter: Secondary | ICD-10-CM

## 2022-12-26 DIAGNOSIS — S79912A Unspecified injury of left hip, initial encounter: Secondary | ICD-10-CM | POA: Diagnosis not present

## 2022-12-26 DIAGNOSIS — S59902A Unspecified injury of left elbow, initial encounter: Secondary | ICD-10-CM | POA: Diagnosis not present

## 2022-12-26 LAB — CBC WITH DIFFERENTIAL/PLATELET
Abs Immature Granulocytes: 0.08 10*3/uL — ABNORMAL HIGH (ref 0.00–0.07)
Basophils Absolute: 0 10*3/uL (ref 0.0–0.1)
Basophils Relative: 1 %
Eosinophils Absolute: 0.1 10*3/uL (ref 0.0–0.5)
Eosinophils Relative: 1 %
HCT: 32.7 % — ABNORMAL LOW (ref 36.0–46.0)
Hemoglobin: 10.1 g/dL — ABNORMAL LOW (ref 12.0–15.0)
Immature Granulocytes: 1 %
Lymphocytes Relative: 22 %
Lymphs Abs: 1.9 10*3/uL (ref 0.7–4.0)
MCH: 29.9 pg (ref 26.0–34.0)
MCHC: 30.9 g/dL (ref 30.0–36.0)
MCV: 96.7 fL (ref 80.0–100.0)
Monocytes Absolute: 0.9 10*3/uL (ref 0.1–1.0)
Monocytes Relative: 10 %
Neutro Abs: 5.7 10*3/uL (ref 1.7–7.7)
Neutrophils Relative %: 65 %
Platelets: 306 10*3/uL (ref 150–400)
RBC: 3.38 MIL/uL — ABNORMAL LOW (ref 3.87–5.11)
RDW: 13.6 % (ref 11.5–15.5)
WBC: 8.7 10*3/uL (ref 4.0–10.5)
nRBC: 0 % (ref 0.0–0.2)

## 2022-12-26 LAB — COMPREHENSIVE METABOLIC PANEL
ALT: 13 U/L (ref 0–44)
AST: 18 U/L (ref 15–41)
Albumin: 3.6 g/dL (ref 3.5–5.0)
Alkaline Phosphatase: 44 U/L (ref 38–126)
Anion gap: 8 (ref 5–15)
BUN: 26 mg/dL — ABNORMAL HIGH (ref 8–23)
CO2: 28 mmol/L (ref 22–32)
Calcium: 8.5 mg/dL — ABNORMAL LOW (ref 8.9–10.3)
Chloride: 101 mmol/L (ref 98–111)
Creatinine, Ser: 0.75 mg/dL (ref 0.44–1.00)
GFR, Estimated: >60 mL/min (ref >60)
Glucose, Bld: 133 mg/dL — ABNORMAL HIGH (ref 70–99)
Potassium: 4.8 mmol/L (ref 3.5–5.1)
Sodium: 137 mmol/L (ref 135–145)
Total Bilirubin: 0.6 mg/dL (ref 0.3–1.2)
Total Protein: 6.8 g/dL (ref 6.5–8.1)

## 2022-12-26 LAB — PROTIME-INR
INR: 1 (ref 0.8–1.2)
Prothrombin Time: 13 seconds (ref 11.4–15.2)

## 2022-12-26 LAB — LIPASE, BLOOD: Lipase: 26 U/L (ref 11–51)

## 2022-12-26 MED ORDER — IOHEXOL 300 MG/ML  SOLN
100.0000 mL | Freq: Once | INTRAMUSCULAR | Status: AC | PRN
  Administered 2022-12-26: 100 mL via INTRAVENOUS

## 2022-12-26 MED ORDER — SODIUM CHLORIDE 0.9 % IV BOLUS: 1000.0000 mL | Freq: Once | INTRAVENOUS | Status: DC

## 2022-12-26 MED ORDER — METOCLOPRAMIDE HCL 5 MG/ML IJ SOLN: 10.0000 mg | Freq: Once | INTRAMUSCULAR | Status: DC

## 2022-12-26 NOTE — Discharge Instructions (Addendum)
You were seen in the emergency department for a fall.  You had a large hematoma to your right flank.  You have no broken bones.  No internal bleeding.  You did have a drop of your hemoglobin level from 13-10 from the bleeding in hematoma.  You had an old compression fracture to T12.  Follow-up closely with your primary care physician.  You can alternate ice and heat.  Take Tylenol as needed for pain control.

## 2022-12-26 NOTE — ED Provider Notes (Addendum)
[facility] Provider Note    Event Date/Time   First MD Initiated Contact with Patient 12/26/22 1605     (approximate)   History   Fall   HPI  Tina Berger is a 72 y.o. female presents to the emergency department following a fall.  Patient has a history of rheumatoid arthritis and states that she got dizzy and fell backwards on Wednesday when trying to put on her shoes.  States that she hit the right side of her back and her left elbow on a chair.  Progressively worsening bruising since that time.  Not on anticoagulation.  Denies any headache or neck pain.  Denies any chest pain or shortness of breath.  States that she always bruise easily.      Physical Exam   Triage Vital Signs: ED Triage Vitals  Enc Vitals Group     BP 12/26/22 1605 (!) 135/102     Pulse Rate 12/26/22 1605 97     Resp 12/26/22 1605 19     Temp 12/26/22 1605 98.8 F (37.1 C)     Temp Source 12/26/22 1605 Oral     SpO2 12/26/22 1605 95 %     Weight 12/26/22 1553 185 lb (83.9 kg)     Height 12/26/22 1553 '5\' 1"'$  (1.549 m)     Head Circumference --      Peak Flow --      Pain Score 12/26/22 1627 0     Pain Loc --      Pain Edu? --      Excl. in [place]? --     Most recent vital signs: Vitals:   12/26/22 1605 12/26/22 1900  BP: (!) 135/102 (!) 147/86  Pulse: 97 81  Resp: 19 18  Temp: 98.8 F (37.1 C)   SpO2: 95% 92%    Physical Exam Constitutional:      Appearance: She is well-developed.  HENT:     Head: Atraumatic.     Right Ear: External ear normal.     Left Ear: External ear normal.  Eyes:     Extraocular Movements: Extraocular movements intact.     Conjunctiva/sclera: Conjunctivae normal.     Pupils: Pupils are equal, round, and reactive to light.  Cardiovascular:     Rate and Rhythm: Normal rate and regular rhythm.  Pulmonary:     Effort: No respiratory distress.  Abdominal:     General: There is no distension.     Tenderness: There is abdominal tenderness.      Comments: Significant ecchymosis to the right flank  Musculoskeletal:        General: No tenderness. Normal range of motion.     Cervical back: Normal range of motion. No tenderness.     Comments: Ecchymosis to the left elbow and upper arm.  Sensation intact to bilateral upper and lower extremities.  No bony tenderness except for the left upper extremity.  Skin:    General: Skin is warm.  Neurological:     General: No focal deficit present.     Mental Status: She is alert. Mental status is at baseline.     IMPRESSION / MDM / ASSESSMENT AND PLAN / ED COURSE  I reviewed the triage vital signs and the nursing notes.  Differential diagnosis including intra-abdominal hemorrhage, ecchymosis of the skin, fracture/dislocation  On chart review patient is on anticoagulation.  EKG  No tachycardic or bradycardic dysrhythmias while on cardiac telemetry.  RADIOLOGY I independently reviewed imaging,  my interpretation of imaging: CT chest abdomen pelvis with contrast, hematoma to the right flank, no active extravasation.  x-ray imaging of the left upper extremity and left hip no acute fracture or dislocation  Delay given difficulties with the CT scan pushing over to PACS system and unable to see the read.  Ultimately able to get into the PACS system to see the patient's read and imaging.  LABS (all labs ordered are listed, but only abnormal results are displayed) Labs interpreted as -    Labs Reviewed  CBC WITH DIFFERENTIAL/PLATELET - Abnormal; Notable for the following components:      Result Value   RBC 3.38 (*)    Hemoglobin 10.1 (*)    HCT 32.7 (*)    Abs Immature Granulocytes 0.08 (*)    All other components within normal limits  COMPREHENSIVE METABOLIC PANEL - Abnormal; Notable for the following components:   Glucose, Bld 133 (*)    BUN 26 (*)    Calcium 8.5 (*)    All other components within normal limits  LIPASE, BLOOD  PROTIME-INR    MDM  Patient had acute blood  loss and hemoglobin drop from her right flank.  No active extravasation.  Patient is not on any anticoagulation.  The fall occurred on Wednesday, do not feel that she needs any further intervention at this time.  CT scan with old compression fracture which the patient knows about.  Patient will follow-up as an outpatient with orthopedics.  Given information to follow-up with her primary care provider.  Given return precautions for worsening symptoms.     PROCEDURES:  Critical Care performed: no  Procedures  Patient's presentation is most consistent with acute presentation with potential threat to life or bodily function.   MEDICATIONS ORDERED IN ED: Medications  iohexol (OMNIPAQUE) 300 MG/ML solution 100 mL (100 mLs Intravenous Contrast Given 12/26/22 1740)    FINAL CLINICAL IMPRESSION(S) / ED DIAGNOSES   Final diagnoses:  Hematoma  Fall in home, initial encounter  Anemia, unspecified type     Rx / DC Orders   ED Discharge Orders     None        Note:  This document was prepared using Dragon voice recognition software and may include unintentional dictation errors.   Nathaniel Man, MD 12/26/22 [id number]    Nathaniel Man, MD 12/26/22 [phone]

## 2022-12-26 NOTE — ED Notes (Signed)
Patient transported to CT 

## 2022-12-26 NOTE — ED Triage Notes (Addendum)
Pt states on Wednesday she stood and lost her balance and fell back onto a table hitting her right side and back on it. Pt states she hit her left arm on something but is unsure of what. Pt endorses also hitting her head but no LOC. Pt with pain in her forearm up to her elbow.PT with severe bruising to her right side and back as well as left upper arm. Denies use of blood thinners.

## 2022-12-29 ENCOUNTER — Ambulatory Visit (INDEPENDENT_AMBULATORY_CARE_PROVIDER_SITE_OTHER): Payer: Medicare Other | Admitting: Family Medicine

## 2022-12-29 ENCOUNTER — Encounter: Payer: Self-pay | Admitting: Family Medicine

## 2022-12-29 VITALS — BP 134/82 | HR 114 | Temp 97.8°F | Ht 60.0 in | Wt 171.4 lb

## 2022-12-29 DIAGNOSIS — S301XXA Contusion of abdominal wall, initial encounter: Secondary | ICD-10-CM | POA: Diagnosis not present

## 2022-12-29 DIAGNOSIS — D508 Other iron deficiency anemias: Secondary | ICD-10-CM

## 2022-12-29 DIAGNOSIS — R296 Repeated falls: Secondary | ICD-10-CM

## 2022-12-29 NOTE — Progress Notes (Signed)
Subjective:    Patient ID: Tina Berger, female    DOB: [birth year], 72 y.o.   MRN: [id number]  HPI Pt presents for ER follow up for c/o hematoma after a fall   Wt Readings from Last 3 Encounters:  12/29/22 171 lb 6 oz (77.7 kg)  12/26/22 185 lb (83.9 kg)  09/16/22 185 lb 6.4 oz (84.1 kg)   33.47 kg/m  Vitals:   12/29/22 1453  BP: 134/82  Pulse: (!) 114  Temp: 97.8 F (36.6 C)  SpO2: 97%    Presented on 3/10 after a fall  Hit R side of back and L elbow on a chair  Developed bruising   MDM  Patient had acute blood loss and hemoglobin drop from her right flank. No active extravasation. Patient is not on any anticoagulation. The fall occurred on Wednesday, do not feel that she needs any further intervention at this time. CT scan with old compression fracture which the patient knows about. Patient will follow-up as an outpatient with orthopedics. Given information to follow-up with her primary care provider. Given return precautions for worsening symptoms    Lab Results  Component Value Date   CREATININE 0.75 12/26/2022   BUN 26 (H) 12/26/2022   NA 137 12/26/2022   K 4.8 12/26/2022   CL 101 12/26/2022   CO2 28 12/26/2022   Lab Results  Component Value Date   ALT 13 12/26/2022   AST 18 12/26/2022   ALKPHOS 44 12/26/2022   BILITOT 0.6 12/26/2022   Lab Results  Component Value Date   INR 1.0 12/26/2022   Lab Results  Component Value Date   WBC 8.7 12/26/2022   HGB 10.1 (L) 12/26/2022   HCT 32.7 (L) 12/26/2022   MCV 96.7 12/26/2022   PLT 306 12/26/2022   Lab Results  Component Value Date   LIPASE 26 12/26/2022     DG Hip Unilat With Pelvis 2-3 Views Left  Result Date: 12/26/2022 CLINICAL DATA:  Fall and trauma to the right hip. EXAM: DG HIP (WITH OR WITHOUT PELVIS) 2-3V LEFT COMPARISON:  None Available. FINDINGS: There is no acute fracture or dislocation. The bones are osteopenic. No significant arthritic changes. Degenerative changes of the lower  lumbar spine. Excreted contrast within the urinary bladder. The soft tissues are unremarkable. IMPRESSION: No acute fracture or dislocation. Osteopenia. Electronically Signed   By: Anner Crete M.D.   On: 12/26/2022 19:51   DG Elbow Complete Left  Result Date: 12/26/2022 CLINICAL DATA:  Fall several days ago with trauma to the left arm EXAM: LEFT ELBOW - COMPLETE 4 VIEW COMPARISON:  None Available. FINDINGS: There is no evidence of fracture dislocation. There is no evidence of arthropathy or other focal bone abnormality. Soft tissues are unremarkable. IMPRESSION: 1. No acute fracture or dislocation. 2. Evaluation for joint effusion is suboptimal in the absence of true lateral view. Electronically Signed   By: Darrin Nipper M.D.   On: 12/26/2022 19:50   CT CHEST ABDOMEN PELVIS W CONTRAST  Result Date: 12/26/2022 CLINICAL DATA:  Trauma. EXAM: CT CHEST, ABDOMEN, AND PELVIS WITH CONTRAST TECHNIQUE: Multidetector CT imaging of the chest, abdomen and pelvis was performed following the standard protocol during bolus administration of intravenous contrast. RADIATION DOSE REDUCTION: This exam was performed according to the departmental dose-optimization program which includes automated exposure control, adjustment of the mA and/or kV according to patient size and/or use of iterative reconstruction technique. CONTRAST:  159m OMNIPAQUE IOHEXOL 300 MG/ML  SOLN COMPARISON:  Chest  CT dated 01/16/2021. FINDINGS: Evaluation of this exam is limited due to respiratory motion artifact. CT CHEST FINDINGS Cardiovascular: There is no cardiomegaly or pericardial effusion. The thoracic aorta is unremarkable. The central pulmonary arteries appear patent. Mediastinum/Nodes: No hilar or mediastinal adenopathy. The esophagus is grossly unremarkable. No mediastinal fluid collection. Lungs/Pleura: The lungs are clear. There is no pleural effusion or pneumothorax. The central airways are patent. Musculoskeletal: Age indeterminate,  old-appearing, T12 compression fracture with greater than 50% loss of vertebral body height and anterior wedging. Correlation with clinical exam and point tenderness recommended. No other acute osseous pathology. CT ABDOMEN PELVIS FINDINGS No intra-abdominal free air or free fluid. Hepatobiliary: No focal liver abnormality is seen. No gallstones, gallbladder wall thickening, or biliary dilatation. Pancreas: Unremarkable. No pancreatic ductal dilatation or surrounding inflammatory changes. Spleen: Normal in size without focal abnormality. Adrenals/Urinary Tract: The adrenal glands unremarkable. There is no hydronephrosis on either side. The visualized ureters and urinary bladder appear unremarkable. Air within the bladder, likely introduced via recent instrumentation. Stomach/Bowel: There is severe sigmoid diverticulosis without active inflammatory changes. There is moderate stool throughout the colon. There is no bowel obstruction or active inflammation. The appendix is not visualized with certainty. No inflammatory changes identified in the right lower quadrant. Vascular/Lymphatic: Mild aortoiliac atherosclerotic disease. The IVC is unremarkable. No portal venous gas. There is no adenopathy. Reproductive: The uterus is anteverted and grossly unremarkable. No adnexal masses. Other: There is mild stranding of the subcutaneous soft tissues of the right anterior pelvic wall, likely contusion. Musculoskeletal: There is a 5 x 12 x 5 cm subcutaneous hematoma in the posterior right flank. There is osteopenia with degenerative changes of the spine. No acute osseous pathology. IMPRESSION: 1. A 5 x 12 x 5 cm subcutaneous hematoma in the posterior right flank. 2. Age indeterminate, old-appearing, T12 compression fracture with greater than 50% loss of vertebral body height and anterior wedging. Correlation with clinical exam and point tenderness recommended. 3. Severe sigmoid diverticulosis.  No bowel obstruction. 4.  Aortic  Atherosclerosis (ICD10-I70.0). Electronically Signed   By: Anner Crete M.D.   On: 12/26/2022 18:11      Currently sore in her forearm with bruise under elbow Lump in side is gradually going down   No more tired than usual  Not dizzy   Sleeps in a recliner since December  Husband snores-cannot be in the same room    She keeps falling backwards and this bothers her  Sensitive feet are one reason  Has to sit to put shoes on now  Is being very careful   A lot of chronic pain - spine and joints- also make her fall  Also raynaud's  Takes oxycodone -- less than what it px to her Goes to pain clinic   Doing exercises to strengthen core and legs    Patient Active Problem List   Diagnosis Date Noted   Hematoma of flank 12/29/2022   Frequent falls 12/29/2022   Dysuria 09/07/2021   Rheumatoid arthritis ([place]) 05/27/2021   Chronic venous insufficiency 05/23/2021   AVM (arteriovenous malformation) 04/04/2021   CAD (coronary artery disease) 01/22/2021   Primary osteoarthritis of both knees 01/12/2021   Positive ANA (antinuclear antibody) 11/17/2020   Joint pain 11/13/2020   Current use of proton pump inhibitor 08/26/2020   Estrogen deficiency 08/21/2018   TMJ (dislocation of temporomandibular joint), initial encounter 05/05/2018   Anterior neck pain 05/05/2018   Chronic pain syndrome 11/09/2017   Chronic upper extremity pain [facility] Area of  Pain) (Bilateral) (L>R) 11/09/2017   Fibromyalgia syndrome 11/09/2017   Osteoarthritis 11/09/2017   Osteoarthritis of lumbar facet joint (Bilateral) 11/09/2017   Lumbar facet arthropathy (Bilateral) 11/09/2017   Lumbar facet syndrome (Bilateral) (L>R) 11/09/2017   Lumbar foraminal stenosis (multilevel) (Bilateral) 11/09/2017   DDD (degenerative disc disease), lumbar 11/09/2017   Thoracic levoscoliosis 11/09/2017   Thoracic facet syndrome (Bilateral) (L>R) 11/09/2017   Thoracic facet arthropathy (Bilateral) (R>L) 11/09/2017   DDD  (degenerative disc disease), thoracic 11/09/2017   Thoracolumbar IVDD 11/09/2017   DDD (degenerative disc disease), cervical 11/09/2017   Osteoarthritis of cervical facet (Bilateral) 11/09/2017   Grade 1 Anterolisthesis of C7 over T1 11/09/2017   Cervical foraminal stenosis (Bilateral) 11/09/2017   History of fusion of cervical spine (C5-6 ACDF) 11/09/2017   Cervical facet syndrome (Bilateral) 11/09/2017   Disorder of skeletal system 11/09/2017   Cervical radiculitis (Bilateral) 11/09/2017   Lumbar Epidural lipomatosis 11/09/2017   Chronic sacroiliac joint pain (Bilateral) (L>R) 11/09/2017   Chronic hip pain (Bilateral) (L>R) 11/09/2017   Chronic upper back pain (Primary Area of Pain) (midline) 10/24/2017   Chronic neck pain (Secondary Area of Pain) (Bilateral)  (L>R) 10/24/2017   Chronic low back pain (Fourth Area of Pain) (Bilateral) (L>R) 10/24/2017   Chronic lower extremity pain (Fifth Area of Pain) (Bilateral) (L>R) 10/24/2017   Lumbar Grade 1 Retrolisthesis of L1-2 and L2-3 10/24/2017   Other long term (current) drug therapy 10/24/2017   Other specified health status 10/24/2017   Long term current use of opiate analgesic 10/24/2017   Long term prescription opiate use 10/24/2017   Opiate use 10/24/2017   DM type 2 (diabetes mellitus, type 2) ([place]) 06/14/2014   Pharmacologic therapy 06/14/2014   Pedal edema    Post-menopausal 06/23/2012   Lumbar disc disease with radiculopathy 11/18/2011   HTN (hypertension) 07/30/2011   Raynaud disease 07/30/2011   Obesity 07/30/2011   Routine general medical examination at a health care facility 03/28/2011   Palpitations 04/27/2010   Depression with anxiety 06/14/2008   Vitamin D deficiency 05/13/2008   ANXIETY 03/25/2008   Osteopenia 03/25/2008   Hypothyroidism 03/28/2007   Hyperlipidemia associated with type 2 diabetes mellitus ([place]) 03/28/2007   Other iron deficiency anemias 03/28/2007   PANIC ATTACK 03/28/2007   KERATOCONJUNCTIVITIS  SICCA 03/28/2007   Mitral valve disorder 03/28/2007   ABNORMAL HEART RHYTHMS 03/28/2007   Raynaud's syndrome 03/28/2007   GERD 03/28/2007   IBS 03/28/2007   Rosacea 03/28/2007   PLANTAR FASCIITIS 03/28/2007   MIGRAINES, HX OF 03/28/2007   Past Medical History:  Diagnosis Date   Acute renal insufficiency 05/31/2014   pt not aware of this diagnosis   Anemia, iron deficiency    Anxiety    Bilateral lower extremity edema    a. uses torsemide   Cervical dysplasia    abnormal paps   Colon cancer screening 06/14/2014   Coronary artery disease    Cough 08/08/2012   Degenerative disk disease 11/19/2011   Depression    Diabetes mellitus type II    Diverticulosis    DJD (degenerative joint disease)    Drug rash 05/22/2011   Dry eyes    Dysrhythmia    metoprolol.   Edema    Elevated liver enzymes 03/21/2012   Encounter for routine gynecological examination 06/14/2014   ESOPHAGITIS 03/28/2007   Qualifier: Hospitalized for  By: Marcelino Scot CMA, [place]     Fibromyalgia    GASTRITIS 03/28/2007   Qualifier: History of  By: Marcelino Scot CMA, [place] Bilberry  GERD (gastroesophageal reflux disease)    Hemorrhoids    external   HLD (hyperlipidemia)    HNP (herniated nucleus pulposus) 11/1997   T6,7,8 with DJD   HTN (hypertension)    Hyperglycemia 05/13/2008   Qualifier: Diagnosis of  By: Glori Bickers MD, Carmell [place]    Hypothyroidism    Interstitial lung disease ([place])    pt not aware of this   Left ovarian cyst    x 3, rupture   Osteoarthritis    hands   Osteopenia    mild-11/01; improved 12/05   Other screening mammogram 08/18/2011   Palpitations    PERSONAL HISTORY ALLERGY UNSPEC MEDICINAL AGENT 03/18/2010   Qualifier: Diagnosis of  By: Glori Bickers MD, Carmell [place]    PONV (postoperative nausea and vomiting)    Raynaud's disease    Recurrent HSV (herpes simplex virus)    lesions in nose or mouth with frequent ulcers. d/t meds and dry mouth   Rheumatoid arthritis ([place])    Rhinitis  03/21/2012   Routine general medical examination at a health care facility 03/28/2011   Past Surgical History:  Procedure Laterality Date   ABDOMINAL EXPLORATION SURGERY     ACDF  1991   BIOPSY OF SKIN SUBCUTANEOUS TISSUE AND/OR MUCOUS MEMBRANE Left 04/08/2021   Procedure: BIOPSY OF SKIN SUBCUTANEOUS TISSUE AND/OR MUCOUS MEMBRANE ( REMOVAL SKIN NODULE);  Surgeon: Katha Cabal, MD;  Location: [facility] ORS;  Service: Vascular;  Laterality: Left;   COLONOSCOPY  08/2000   Diverticulosis; hemorrhoids   HAND SURGERY Left    left thumb. PIN REMOVED   LASIK     bilateral   Social History   Tobacco Use   Smoking status: Former    Packs/day: 1.00    Years: 5.00    Total pack years: 5.00    Types: Cigarettes    Quit date: 10/18/1968    Years since quitting: 54.2    Passive exposure: Never   Smokeless tobacco: Never   Tobacco comments:    quit over 40 years  Vaping Use   Vaping Use: Never used  Substance Use Topics   Alcohol use: No    Alcohol/week: 0.0 standard drinks of alcohol   Drug use: No   Family History  Problem Relation Age of Onset   Lung cancer Mother        + smoker   Coronary artery disease Mother        relatively young   Emphysema Father        + smoker   Lymphoma Sister    Heart disease Brother    Lymphoma Brother    Parkinson's disease Brother    Anemia Brother        aplastic    Diabetes Brother    Iron deficiency Daughter    Leukemia Son    Breast cancer Neg Hx    Allergies  Allergen Reactions   Ceftin [Cefuroxime] Swelling    Swelling, "legs turn blue".  Legs swelling, then blue, then a rash   Clonidine Derivatives     Swelling of lips   Erythromycin Swelling    Rash, swollen gums    Keflex [Cephalexin] Anaphylaxis    Chest was broken out in a rash. Lips were swelling. Took a few days to occur, but it kept getting worse.  Penicillins Anaphylaxis    Pt had a daughter with an allergy to this at a young age. Patient developed blisters anywhere her child touched her. Also, if she used the bathroom after daughter did while on keflex, it would cause a reaction for her. Lips and roof of mouth swelled up also.   Sulfonamide Derivatives Swelling    Rash, swollen gums, lips   Ciprofloxacin     REACTION: ? rash vs sun rxn. Rash    Fluoxetine Hcl     REACTION: stomach problems. Severe pain in abdomen. Could not function d/t pain   Furosemide Swelling    REACTION: swelling worsened in legs once taking   Gabapentin Other (See Comments)    REACTION: edema of feet. Unable to get shoes on   Hydroxychloroquine Itching    Pt report itching felt like "pre-anaphylaxis" she has had with other medications   Paroxetine     REACTION: weight gain (30 pound weight gain   Pregabalin     REACTION: swelling of feet and legs. Unable to get shoes on.   Tetracycline     REACTION:inflammed genitals.    Venlafaxine     REACTION: sweating profusely   Etodolac     REACTION: reaction not known   Macrobid [Nitrofurantoin]     REACTION: ? Lung problem    Amitriptyline Hcl     REACTION: sedating   Atorvastatin     REACTION: muscle twitch and pain   Benicar [Olmesartan Medoxomil]     Muscle pain    Cephalexin Hives and Rash   Cetirizine Hcl     REACTION: headache   Diovan [Valsartan]     Thought it made her feel confused   Naproxen Sodium Other (See Comments)    REACTION: edema of feet and legs.  Not good for ckd   Current Outpatient Medications on File Prior to Visit  Medication Sig Dispense Refill   Acetaminophen (TYLENOL ARTHRITIS PAIN PO) Take 650 mg by mouth daily.     buPROPion (WELLBUTRIN XL) 150 MG 24 hr tablet TAKE 1 TABLET BY MOUTH DAILY 90 tablet 3   Chlorphen-Phenyleph-ASA (ALKA-SELTZER PLUS COLD PO) Take by mouth as needed. Just for colds      cholecalciferol (VITAMIN D3) 25 MCG (1000 UNIT) tablet Take 1,000 Units by mouth daily.     cyclobenzaprine (FLEXERIL) 5 MG tablet Take 5 mg by mouth daily as needed.     Diclofenac Sodium 1.5 % SOLN Place 2 mLs onto the skin 4 (four) times daily. 3 Bottle 3   DULoxetine (CYMBALTA) 60 MG capsule Take 1 capsule (60 mg total) by mouth daily. 90 capsule 3   esomeprazole (NEXIUM) 20 MG capsule TAKE 1 CAPSULE BY MOUTH DAILY 90 capsule 3   famotidine (PEPCID) 40 MG tablet TAKE 1 TABLET BY MOUTH DAILY 90 tablet 3   ferrous sulfate 325 (65 FE) MG EC tablet Take 325 mg by mouth daily with breakfast.     glucose blood test strip One Touch Ultra stripts blue-To check sugar once daily and as needed for DM2 250.00 100 each 3   GUAIFENESIN CR PO Take by mouth as needed.     HYDROmorphone HCl (EXALGO) 12 MG T24A SR tablet Take 12 mg by mouth 2 (two) times daily.     hyoscyamine (LEVSIN SL) 0.125 MG SL tablet TAKE 1 TABLET BY MOUTH EVERY 4 HOURS AS NEEDED FOR CRAMPING. 270 tablet 1   ketoconazole (NIZORAL) 2 % shampoo SHAMPOO WITH A SMALL AMOUNT AS DIRECTED  THREE TIMES A WEEK SHAMPOO SCALP 3 DAYS A WEEK, LET SHAMPOO SIT FOR 10 MINUTES AND RINSE OFF 120 mL 2   levothyroxine (SYNTHROID) 125 MCG tablet Take 1 tablet (125 mcg total) by mouth daily. 90 tablet 3   LYSINE PO Take by mouth as needed. OTC for ulcer prophylaxis     Magnesium 400 MG CAPS Take by mouth daily.     metFORMIN (GLUCOPHAGE) 1000 MG tablet Take 1 tablet (1,000 mg total) by mouth 2 (two) times daily with a meal. 180 tablet 3   metoprolol succinate (TOPROL-XL) 50 MG 24 hr tablet TAKE 1 TABLET BY MOUTH TWICE  DAILY 180 tablet 3   naloxone (NARCAN) nasal spray 4 mg/0.1 mL SMARTSIG:Both Nares     ondansetron (ZOFRAN) 4 MG tablet Take 1 tablet (4 mg total) by mouth every 8 (eight) hours as needed for nausea or vomiting. 20 tablet 0   oxyCODONE (OXYCONTIN) 10 MG 12 hr tablet Take 10 mg by mouth 3 (three) times daily as needed. Beakthrough pain      predniSONE (DELTASONE) 1 MG tablet TAKE 2 TABLETS BY MOUTH ONCE WITH BREAKFAST , ALONG WITH '5MG'$  TO EQUAL '7MG'$  DAILY 60 tablet 1   predniSONE (DELTASONE) 10 MG tablet Take 1 tablet (10 mg total) by mouth daily with breakfast. 30 tablet 2   predniSONE (DELTASONE) 5 MG tablet Take 2 tablets (10 mg total) by mouth daily. 60 tablet 2   ramipril (ALTACE) 5 MG capsule TAKE 1 CAPSULE BY MOUTH DAILY 90 capsule 3   rosuvastatin (CRESTOR) 5 MG tablet TAKE 1 TABLET BY MOUTH TWICE  WEEKLY 26 tablet 1   tiZANidine (ZANAFLEX) 4 MG tablet Take 4 mg by mouth every 8 (eight) hours as needed for muscle spasms.     Tocilizumab (ACTEMRA ACTPEN) 162 MG/0.9ML SOAJ Inject 162 mg into the skin every 14 (fourteen) days. 5.4 mL 0   No current facility-administered medications on file prior to visit.      Review of Systems  Constitutional:  Negative for activity change, appetite change, fatigue, fever and unexpected weight change.  HENT:  Negative for congestion, ear pain, rhinorrhea, sinus pressure and sore throat.   Eyes:  Negative for pain, redness and visual disturbance.  Respiratory:  Negative for cough, shortness of breath and wheezing.   Cardiovascular:  Negative for chest pain and palpitations.  Gastrointestinal:  Negative for abdominal pain, blood in stool, constipation and diarrhea.  Endocrine: Negative for polydipsia and polyuria.  Genitourinary:  Negative for dysuria, frequency and urgency.  Musculoskeletal:  Positive for arthralgias and back pain. Negative for myalgias.  Skin:  Positive for color change and wound. Negative for pallor and rash.  Allergic/Immunologic: Negative for environmental allergies.  Neurological:  Positive for numbness. Negative for dizziness, syncope, facial asymmetry, light-headedness and headaches.  Hematological:  Negative for adenopathy. Does not bruise/bleed easily.  Psychiatric/Behavioral:  Negative for decreased concentration and dysphoric mood. The patient is not  nervous/anxious.        Objective:   Physical Exam Constitutional:      General: She is not in acute distress.    Appearance: Normal appearance. She is well-developed. She is obese. She is not ill-appearing or diaphoretic.  HENT:     Head: Normocephalic and atraumatic.  Eyes:     Conjunctiva/sclera: Conjunctivae normal.     Pupils: Pupils are equal, round, and reactive to light.  Neck:     Thyroid: No thyromegaly.     Vascular: No carotid bruit or JVD.  Cardiovascular:     Rate and Rhythm: Normal rate and regular rhythm.     Heart sounds: Normal heart sounds.     No gallop.     Comments: Baseline raynaud's in feet/fingers  Pulmonary:     Effort: Pulmonary effort is normal. No respiratory distress.     Breath sounds: Normal breath sounds. No wheezing or rales.  Abdominal:     General: There is no distension or abdominal bruit.     Palpations: Abdomen is soft.     Tenderness: There is no abdominal tenderness. There is no guarding or rebound.  Musculoskeletal:     Cervical back: Normal range of motion and neck supple.     Right lower leg: No edema.     Left lower leg: No edema.     Comments: Some improvement in arthritic hand changes    Lymphadenopathy:     Cervical: No cervical adenopathy.  Skin:    General: Skin is warm and dry.     Coloration: Skin is not jaundiced or pale.     Findings: Bruising present. No rash.     Comments: Large hematoma of R flank  with pale center, and purple color turning to yellow in periphery  This extends part way to R abdomen Smaller bruise without induration on underside of L upper arm  Only mildly tender No bony tenderness    Nl rom of joints and nl gait   Neurological:     Mental Status: She is alert. Mental status is at baseline.     Cranial Nerves: No cranial nerve deficit.     Coordination: Coordination normal.     Gait: Gait normal.     Deep Tendon Reflexes: Reflexes are normal and symmetric. Reflexes normal.     Comments: No  focal weakness (some generalized)  Decreased sens in feet/fingers  Psychiatric:        Mood and Affect: Mood normal.           Assessment & Plan:   Problem List Items Addressed This Visit       Other   Frequent falls    No doubt multi factorial Factors include raynaud/s, neuropathy, chronic pain, spinal disease, chronic narcotic medication and polypharmacy and deconditioning  She has done PT for fall prevention twice  Declines neuro exam currently   Disc fall prev in detail  Enc to use a walker if unsteady  Dress/ put shoes on sitting and do not get an a hurry        Hematoma of flank - Primary    Large hematoma on R flank after a backwards fall / seen in ER on 3/10 with reaassuring work up  Reviewed hospital records, lab results and studies in detail  Hb did go down from this   Today improvement is noted  No severe pain  Color of bruise is lightening and looks more yellow Pt is able to get comfortable in recliner Disc symptom care  Disc ER precautions  She takes iron baseline  Cbc today         Relevant Orders   CBC with Differential/Platelet   Iron   Other iron deficiency anemias    Hb dropped to 10.1 with large hematoma after a fall  On iron baseline  Feeling ok  Lab today      Relevant Orders   CBC with Differential/Platelet   Iron

## 2022-12-29 NOTE — Assessment & Plan Note (Signed)
No doubt multi factorial Factors include raynaud/s, neuropathy, chronic pain, spinal disease, chronic narcotic medication and polypharmacy and deconditioning  She has done PT for fall prevention twice  Declines neuro exam currently   Disc fall prev in detail  Enc to use a walker if unsteady  Dress/ put shoes on sitting and do not get an a hurry

## 2022-12-29 NOTE — Assessment & Plan Note (Signed)
Large hematoma on R flank after a backwards fall / seen in ER on 3/10 with reaassuring work up  Reviewed hospital records, lab results and studies in detail  Hb did go down from this   Today improvement is noted  No severe pain  Color of bruise is lightening and looks more yellow Pt is able to get comfortable in recliner Disc symptom care  Disc ER precautions  She takes iron baseline  Cbc today

## 2022-12-29 NOTE — Patient Instructions (Addendum)
I think the hematoma is making slow progress  Use cool or warm compresses if needed  Avoid traumatizing the area as well   We will re check cbc and iron today  Continue the iron   Watch for sudden increase in pain  Watch for enlargement of the lump   Keep practicing fall prevention when possible  Sit to put on shoes  Use a walker if you feel unsteady   Take care of yourself

## 2022-12-29 NOTE — Assessment & Plan Note (Signed)
Hb dropped to 10.1 with large hematoma after a fall  On iron baseline  Feeling ok  Lab today

## 2022-12-30 DIAGNOSIS — S301XXA Contusion of abdominal wall, initial encounter: Secondary | ICD-10-CM | POA: Diagnosis not present

## 2022-12-30 DIAGNOSIS — D508 Other iron deficiency anemias: Secondary | ICD-10-CM | POA: Diagnosis not present

## 2022-12-30 LAB — CBC WITH DIFFERENTIAL/PLATELET
Absolute Monocytes: 631 cells/uL (ref 200–950)
Basophils Absolute: 50 cells/uL (ref 0–200)
Basophils Relative: 0.6 %
Eosinophils Absolute: 83 cells/uL (ref 15–500)
Eosinophils Relative: 1 %
HCT: 34.9 % — ABNORMAL LOW (ref 35.0–45.0)
Hemoglobin: 11.5 g/dL — ABNORMAL LOW (ref 11.7–15.5)
Lymphs Abs: 2034 cells/uL (ref 850–3900)
MCH: 29.9 pg (ref 27.0–33.0)
MCHC: 33 g/dL (ref 32.0–36.0)
MCV: 90.6 fL (ref 80.0–100.0)
MPV: 9.9 fL (ref 7.5–12.5)
Monocytes Relative: 7.6 %
Neutro Abs: 5503 cells/uL (ref 1500–7800)
Neutrophils Relative %: 66.3 %
Platelets: 490 10*3/uL — ABNORMAL HIGH (ref 140–400)
RBC: 3.85 10*6/uL (ref 3.80–5.10)
RDW: 13 % (ref 11.0–15.0)
Total Lymphocyte: 24.5 %
WBC: 8.3 10*3/uL (ref 3.8–10.8)

## 2022-12-30 LAB — IRON: Iron: 68 ug/dL (ref 42–145)

## 2022-12-30 NOTE — Addendum Note (Signed)
Addended by: Ellamae Sia on: 12/30/2022 10:42 AM   Modules accepted: Orders

## 2022-12-30 NOTE — Addendum Note (Signed)
Addended by: Ellamae Sia on: 12/30/2022 04:20 PM   Modules accepted: Orders

## 2023-01-03 ENCOUNTER — Telehealth: Payer: Self-pay

## 2023-01-03 NOTE — Telephone Encounter (Signed)
        Patient  visited [place] on 3/10    Telephone encounter attempt :  1st  A HIPAA compliant voice message was left requesting a return call.  Instructed patient to call back    [place] [phone] [address], [place], [place] [address] Phone: [phone] Email: [email or web address]

## 2023-01-04 ENCOUNTER — Telehealth: Payer: Self-pay

## 2023-01-04 NOTE — Telephone Encounter (Signed)
        Patient  visited [place] on 3/10     Telephone encounter attempt :  2nd  A HIPAA compliant voice message was left requesting a return call.  Instructed patient to call back   [place] [phone] [address], [place], [place] [address] Phone: [phone] Email: [email or web address]

## 2023-01-06 ENCOUNTER — Other Ambulatory Visit: Payer: Self-pay | Admitting: Family Medicine

## 2023-01-08 ENCOUNTER — Encounter: Payer: Self-pay | Admitting: Family Medicine

## 2023-01-10 ENCOUNTER — Encounter: Payer: Self-pay | Admitting: Family Medicine

## 2023-02-11 ENCOUNTER — Other Ambulatory Visit: Payer: Self-pay | Admitting: Family Medicine

## 2023-02-14 ENCOUNTER — Emergency Department: Payer: Medicare Other

## 2023-02-14 ENCOUNTER — Encounter: Payer: Self-pay | Admitting: *Deleted

## 2023-02-14 ENCOUNTER — Emergency Department
Admission: EM | Admit: 2023-02-14 | Discharge: 2023-02-14 | Disposition: A | Discharge status: Home / Self Care | Payer: Medicare Other | Attending: Emergency Medicine | Admitting: Emergency Medicine

## 2023-02-14 ENCOUNTER — Other Ambulatory Visit: Payer: Self-pay

## 2023-02-14 DIAGNOSIS — S0181XA Laceration without foreign body of other part of head, initial encounter: Secondary | ICD-10-CM | POA: Diagnosis not present

## 2023-02-14 DIAGNOSIS — E119 Type 2 diabetes mellitus without complications: Secondary | ICD-10-CM | POA: Diagnosis not present

## 2023-02-14 DIAGNOSIS — R519 Headache, unspecified: Secondary | ICD-10-CM | POA: Diagnosis not present

## 2023-02-14 DIAGNOSIS — I2510 Atherosclerotic heart disease of native coronary artery without angina pectoris: Secondary | ICD-10-CM | POA: Diagnosis not present

## 2023-02-14 DIAGNOSIS — S01112A Laceration without foreign body of left eyelid and periocular area, initial encounter: Secondary | ICD-10-CM | POA: Insufficient documentation

## 2023-02-14 DIAGNOSIS — M542 Cervicalgia: Secondary | ICD-10-CM | POA: Diagnosis not present

## 2023-02-14 DIAGNOSIS — S6992XA Unspecified injury of left wrist, hand and finger(s), initial encounter: Secondary | ICD-10-CM | POA: Diagnosis present

## 2023-02-14 DIAGNOSIS — S52615A Nondisplaced fracture of left ulna styloid process, initial encounter for closed fracture: Secondary | ICD-10-CM | POA: Diagnosis not present

## 2023-02-14 DIAGNOSIS — I10 Essential (primary) hypertension: Secondary | ICD-10-CM | POA: Insufficient documentation

## 2023-02-14 DIAGNOSIS — W19XXXA Unspecified fall, initial encounter: Secondary | ICD-10-CM

## 2023-02-14 DIAGNOSIS — R930 Abnormal findings on diagnostic imaging of skull and head, not elsewhere classified: Secondary | ICD-10-CM | POA: Insufficient documentation

## 2023-02-14 DIAGNOSIS — S62115A Nondisplaced fracture of triquetrum [cuneiform] bone, left wrist, initial encounter for closed fracture: Secondary | ICD-10-CM | POA: Diagnosis not present

## 2023-02-14 DIAGNOSIS — W01198A Fall on same level from slipping, tripping and stumbling with subsequent striking against other object, initial encounter: Secondary | ICD-10-CM | POA: Diagnosis not present

## 2023-02-14 DIAGNOSIS — R220 Localized swelling, mass and lump, head: Secondary | ICD-10-CM | POA: Diagnosis not present

## 2023-02-14 DIAGNOSIS — S0990XA Unspecified injury of head, initial encounter: Secondary | ICD-10-CM

## 2023-02-14 NOTE — ED Triage Notes (Addendum)
Pt fell and struck head on the floor.   Pt had slippers on and twisted herself and fell.   No loc.  Pt has small laceration to left eye area with swelling and bruising.   Bleeding controlled.  Pt alert.  No blood thinners.

## 2023-02-14 NOTE — Discharge Instructions (Addendum)
Your head, face, and neck CT scans are negative for any acute findings.  However your wrist x-ray shows a triquetrum fracture.  Please keep your splint clean and dry.  Please follow-up with orthopedics, call them tomorrow to arrange an appointment.  You may continue to keep your arm elevated, and put ice on the area.  Please return for any new, worsening, or change in symptoms or other concerns.

## 2023-02-14 NOTE — ED Provider Notes (Signed)
[facility] Provider Note    Event Date/Time   First MD Initiated Contact with Patient 02/14/23 2215     (approximate)   History   Fall and Head Injury   HPI  Tina Berger is a 72 y.o. female with a past medical history of fibromyalgia, degenerative disc disease, chronic opioid use, type 2 diabetes, depression, anxiety who presents today for evaluation after a fall.  Patient reports that she is slippers on and she slipped and fell, striking her head on the floor.  There was no loss of consciousness.  She reports that she caught herself with her left wrist and also has pain in this location.  She has not had any nausea or vomiting.  No visual changes.  She has been able to ambulate without difficulty.  Patient Active Problem List   Diagnosis Date Noted   Hematoma of flank 12/29/2022   Frequent falls 12/29/2022   Dysuria 09/07/2021   Rheumatoid arthritis (HCC) 05/27/2021   Chronic venous insufficiency 05/23/2021   AVM (arteriovenous malformation) 04/04/2021   CAD (coronary artery disease) 01/22/2021   Primary osteoarthritis of both knees 01/12/2021   Positive ANA (antinuclear antibody) 11/17/2020   Joint pain 11/13/2020   Current use of proton pump inhibitor 08/26/2020   Estrogen deficiency 08/21/2018   TMJ (dislocation of temporomandibular joint), initial encounter 05/05/2018   Anterior neck pain 05/05/2018   Chronic pain syndrome 11/09/2017   Chronic upper extremity pain [facility] Area of Pain) (Bilateral) (L>R) 11/09/2017   Fibromyalgia syndrome 11/09/2017   Osteoarthritis 11/09/2017   Osteoarthritis of lumbar facet joint (Bilateral) 11/09/2017   Lumbar facet arthropathy (Bilateral) 11/09/2017   Lumbar facet syndrome (Bilateral) (L>R) 11/09/2017   Lumbar foraminal stenosis (multilevel) (Bilateral) 11/09/2017   DDD (degenerative disc disease), lumbar 11/09/2017   Thoracic levoscoliosis 11/09/2017   Thoracic facet syndrome (Bilateral) (L>R)  11/09/2017   Thoracic facet arthropathy (Bilateral) (R>L) 11/09/2017   DDD (degenerative disc disease), thoracic 11/09/2017   Thoracolumbar IVDD 11/09/2017   DDD (degenerative disc disease), cervical 11/09/2017   Osteoarthritis of cervical facet (Bilateral) 11/09/2017   Grade 1 Anterolisthesis of C7 over T1 11/09/2017   Cervical foraminal stenosis (Bilateral) 11/09/2017   History of fusion of cervical spine (C5-6 ACDF) 11/09/2017   Cervical facet syndrome (Bilateral) 11/09/2017   Disorder of skeletal system 11/09/2017   Cervical radiculitis (Bilateral) 11/09/2017   Lumbar Epidural lipomatosis 11/09/2017   Chronic sacroiliac joint pain (Bilateral) (L>R) 11/09/2017   Chronic hip pain (Bilateral) (L>R) 11/09/2017   Chronic upper back pain (Primary Area of Pain) (midline) 10/24/2017   Chronic neck pain (Secondary Area of Pain) (Bilateral)  (L>R) 10/24/2017   Chronic low back pain (Fourth Area of Pain) (Bilateral) (L>R) 10/24/2017   Chronic lower extremity pain (Fifth Area of Pain) (Bilateral) (L>R) 10/24/2017   Lumbar Grade 1 Retrolisthesis of L1-2 and L2-3 10/24/2017   Other long term (current) drug therapy 10/24/2017   Other specified health status 10/24/2017   Long term current use of opiate analgesic 10/24/2017   Long term prescription opiate use 10/24/2017   Opiate use 10/24/2017   DM type 2 (diabetes mellitus, type 2) (HCC) 06/14/2014   Pharmacologic therapy 06/14/2014   Pedal edema    Post-menopausal 06/23/2012   Lumbar disc disease with radiculopathy 11/18/2011   HTN (hypertension) 07/30/2011   Raynaud disease 07/30/2011   Obesity 07/30/2011   Routine general medical examination at a health care facility 03/28/2011   Palpitations 04/27/2010   Depression with anxiety 06/14/2008  Vitamin D deficiency 05/13/2008   ANXIETY 03/25/2008   Osteopenia 03/25/2008   Hypothyroidism 03/28/2007   Hyperlipidemia associated with type 2 diabetes mellitus (HCC) 03/28/2007   Other iron  deficiency anemias 03/28/2007   PANIC ATTACK 03/28/2007   KERATOCONJUNCTIVITIS SICCA 03/28/2007   Mitral valve disorder 03/28/2007   ABNORMAL HEART RHYTHMS 03/28/2007   Raynaud's syndrome 03/28/2007   GERD 03/28/2007   IBS 03/28/2007   Rosacea 03/28/2007   PLANTAR FASCIITIS 03/28/2007   MIGRAINES, HX OF 03/28/2007          Physical Exam   Triage Vital Signs: ED Triage Vitals  Enc Vitals Group     BP 02/14/23 2046 (!) 158/89     Pulse Rate 02/14/23 2046 93     Resp 02/14/23 2046 18     Temp 02/14/23 2046 99.1 F (37.3 C)     Temp Source 02/14/23 2046 Oral     SpO2 02/14/23 2046 95 %     Weight 02/14/23 2047 170 lb (77.1 kg)     Height 02/14/23 2047 5' (1.524 m)     Head Circumference --      Peak Flow --      Pain Score 02/14/23 2047 4     Pain Loc --      Pain Edu? --      Excl. in GC? --     Most recent vital signs: Vitals:   02/14/23 2046  BP: (!) 158/89  Pulse: 93  Resp: 18  Temp: 99.1 F (37.3 C)  SpO2: 95%    Physical Exam Vitals and nursing note reviewed.  Constitutional:      General: Awake and alert. No acute distress.    Appearance: Normal appearance. The patient is overweight.  HENT:     Head: Normocephalic and atraumatic.  Left periorbital ecchymosis.  Full and normal extraocular movements.  No proptosis.    Mouth: Mucous membranes are moist.  Eyes:     General: PERRL. Normal EOMs        Right eye: No discharge.        Left eye: No discharge.     Conjunctiva/sclera: Conjunctivae normal.  Cardiovascular:     Rate and Rhythm: Normal rate and regular rhythm.     Pulses: Normal pulses.     Heart sounds: Normal heart sounds Pulmonary:     Effort: Pulmonary effort is normal. No respiratory distress.     Breath sounds: Normal breath sounds.  Abdominal:     Abdomen is soft. There is no abdominal tenderness. No rebound or guarding. No distention. Musculoskeletal:        General: No swelling. Normal range of motion.     Cervical back: Normal  range of motion and neck supple.  Left wrist swelling and tenderness to the ulnar side of wrist.  No open wounds.  Normal radial pulse.  Normal grip strength.  Able to give thumbs up, cross fingers, make okay sign and hold closed against resistance. Skin:    General: Skin is warm and dry.     Capillary Refill: Capillary refill takes less than 2 seconds.     Findings: No rash.  Neurological:     Mental Status: The patient is awake and alert.   Neurological: GCS 15 alert and oriented x3 Normal speech, no expressive or receptive aphasia or dysarthria Cranial nerves II through XII intact Normal visual fields 5 out of 5 strength in all 4 extremities with intact sensation throughout No extremity drift Normal finger-to-nose testing,  no limb or truncal ataxia    ED Results / Procedures / Treatments   Labs (all labs ordered are listed, but only abnormal results are displayed) Labs Reviewed - No data to display   EKG     RADIOLOGY I independently reviewed and interpreted imaging and agree with radiologists findings.     PROCEDURES:  Critical Care performed:   Marland KitchenMarland KitchenLaceration Repair  Date/Time: 02/14/2023 11:11 PM  Performed by: Jackelyn Hoehn, PA-C Authorized by: Jackelyn Hoehn, PA-C   Consent:    Consent obtained:  Verbal   Consent given by:  Patient   Risks, benefits, and alternatives were discussed: yes     Risks discussed:  Infection, need for additional repair, nerve damage, poor wound healing, poor cosmetic result, pain, retained foreign body, tendon damage and vascular damage   Alternatives discussed:  No treatment Universal protocol:    Procedure explained and questions answered to patient or proxy's satisfaction: yes     Relevant documents present and verified: yes     Test results available: yes     Imaging studies available: yes     Required blood products, implants, devices, and special equipment available: yes     Site/side marked: yes     Immediately prior  to procedure, a time out was called: yes     Patient identity confirmed:  Verbally with patient Laceration details:    Location:  Face   Face location:  L eyebrow   Length (cm):  0.8   Depth (mm):  1 Pre-procedure details:    Preparation:  Patient was prepped and draped in usual sterile fashion and imaging obtained to evaluate for foreign bodies Exploration:    Limited defect created (wound extended): no     Hemostasis achieved with:  Direct pressure   Imaging outcome: foreign body not noted     Wound exploration: wound explored through full range of motion and entire depth of wound visualized     Wound extent: areolar tissue not violated, fascia not violated, no foreign body, no signs of injury, no nerve damage, no tendon damage, no underlying fracture and no vascular damage     Contaminated: no   Treatment:    Area cleansed with:  Chlorhexidine   Amount of cleaning:  Standard   Irrigation solution:  Sterile saline   Irrigation method:  Pressure wash   Visualized foreign bodies/material removed: no     Debridement:  None   Undermining:  None Skin repair:    Repair method:  Steri-Strips and tissue adhesive   Number of Steri-Strips:  1 Repair type:    Repair type:  Simple Post-procedure details:    Dressing:  Open (no dressing)   Procedure completion:  Tolerated well, no immediate complications .Splint Application  Date/Time: 02/14/2023 11:13 PM  Performed by: Jackelyn Hoehn, PA-C Authorized by: Jackelyn Hoehn, PA-C   Consent:    Consent obtained:  Verbal   Consent given by:  Patient   Risks, benefits, and alternatives were discussed: yes     Risks discussed:  Discoloration, numbness, pain and swelling   Alternatives discussed:  No treatment Universal protocol:    Procedure explained and questions answered to patient or proxy's satisfaction: yes     Relevant documents present and verified: yes     Test results available: yes     Imaging studies available: yes      Required blood products, implants, devices, and special equipment available: yes     Site/side marked:  yes     Patient identity confirmed:  Verbally with patient Pre-procedure details:    Distal neurologic exam:  Normal   Distal perfusion: distal pulses strong and brisk capillary refill   Procedure details:    Location:  Wrist   Wrist location:  L wrist   Strapping: no     Cast type:  Short arm   Splint type:  Volar short arm   Supplies:  Elastic bandage, fiberglass and cotton padding   Attestation: Splint applied and adjusted personally by me   Post-procedure details:    Distal neurologic exam:  Normal   Distal perfusion: distal pulses strong and brisk capillary refill     Procedure completion:  Tolerated well, no immediate complications   Post-procedure imaging: not applicable      MEDICATIONS ORDERED IN ED: Medications - No data to display   IMPRESSION / MDM / ASSESSMENT AND PLAN / ED COURSE  I reviewed the triage vital signs and the nursing notes.   Differential diagnosis includes, but is not limited to, fracture, dislocation, intracranial hemorrhage, contusion, facial bone fracture, cervical spine injury.  Patient is awake and alert, hemodynamically stable and afebrile.  She demonstrates no focal neurological deficits.  She has periorbital ecchymosis and a small laceration measuring less than 1 cm and hemostatic which was cleaned and closed with glue and a Steri-Strip.  CT head and neck obtained per [place] criteria were negative for any acute findings.  CT face obtained given obvious trauma was also negative for any acute findings.  There is no septal hematoma.  X-ray of her wrist also obtained reveals a triquetrum fracture.  I discussed this finding with the patient and her husband.  She was placed in a volar splint in slight extension.  There are no open wounds.  We discussed the importance of close outpatient follow-up with orthopedics, and she agrees to call tomorrow.   She remained neurovascularly intact both before and after splint placement.  We also discussed the importance of keeping her arm elevated.  Patient presents agrees with plan.  She was discharged in stable condition with her husband.   Patient's presentation is most consistent with acute presentation with potential threat to life or bodily function.      FINAL CLINICAL IMPRESSION(S) / ED DIAGNOSES   Final diagnoses:  Injury of head, initial encounter  Fall, initial encounter  Closed nondisplaced fracture of triquetrum of left wrist, initial encounter     Rx / DC Orders   ED Discharge Orders     None        Note:  This document was prepared using Dragon voice recognition software and may include unintentional dictation errors.   Jackelyn Hoehn, PA-C 02/14/23 2323    Merwyn Katos, MD 02/15/23 [phone]

## 2023-02-17 ENCOUNTER — Telehealth: Payer: Self-pay

## 2023-02-17 DIAGNOSIS — M961 Postlaminectomy syndrome, not elsewhere classified: Secondary | ICD-10-CM | POA: Diagnosis not present

## 2023-02-17 DIAGNOSIS — G894 Chronic pain syndrome: Secondary | ICD-10-CM | POA: Diagnosis not present

## 2023-02-17 DIAGNOSIS — M4725 Other spondylosis with radiculopathy, thoracolumbar region: Secondary | ICD-10-CM | POA: Diagnosis not present

## 2023-02-17 DIAGNOSIS — M5137 Other intervertebral disc degeneration, lumbosacral region: Secondary | ICD-10-CM | POA: Diagnosis not present

## 2023-02-17 DIAGNOSIS — Z79891 Long term (current) use of opiate analgesic: Secondary | ICD-10-CM | POA: Diagnosis not present

## 2023-02-17 DIAGNOSIS — K5903 Drug induced constipation: Secondary | ICD-10-CM | POA: Diagnosis not present

## 2023-02-17 DIAGNOSIS — M5134 Other intervertebral disc degeneration, thoracic region: Secondary | ICD-10-CM | POA: Diagnosis not present

## 2023-02-17 NOTE — Progress Notes (Addendum)
Care Management & Coordination Services Pharmacy Team  Reason for Encounter: Appointment Reminder  Contacted patient to confirm telephone appointment with Al Corpus , PharmD on 02/22/23 at 3:30. Spoke with patient on 02/18/2023    Do you have any problems getting your medications? No  What is your top health concern you would like to discuss at your upcoming visit? Patient reports she took a fall at home in the kitchen and hit her face, no fractures, has black eye.Patient hit her left wrist and has small fracture.  Have you seen any other providers since your last visit with PCP? Yes  4/29/24[facility] at [facility] visits:  02/14/23-[facility]- fall-head injury- discharged home  12/26/22- at [facility]- hematoma- discharged home    Star Rating Drugs:  Medication:  Last Fill: Day Supply Metformin 1000mg  01/09/23 100 Ramipril 5mg   02/05/23 60 Rosuvastatin 5mg  12/26/22 81   Care Gaps: Annual wellness visit in last year? Yes  If Diabetic: Last eye exam / retinopathy screening:UTD Last diabetic foot exam: UTD   Al Corpus, PharmD notified  Burt Knack, [facility] Clinical Pharmacy Assistant [phone]

## 2023-02-19 ENCOUNTER — Other Ambulatory Visit: Payer: Self-pay | Admitting: Family Medicine

## 2023-02-22 ENCOUNTER — Ambulatory Visit: Payer: Medicare Other | Admitting: Pharmacist

## 2023-02-22 ENCOUNTER — Telehealth: Payer: Self-pay

## 2023-02-22 DIAGNOSIS — E119 Type 2 diabetes mellitus without complications: Secondary | ICD-10-CM

## 2023-02-22 MED ORDER — METFORMIN HCL ER 500 MG PO TB24: 1000.0000 mg | ORAL_TABLET | Freq: Two times a day (BID) | ORAL | 0 refills | Status: DC

## 2023-02-22 NOTE — Telephone Encounter (Signed)
Transition Care Management Follow-up Telephone Call Date of discharge and from where: [facility] 4/29 How have you been since you were released from the hospital? [facility]  Any questions or concerns? No  Items Reviewed: Did the pt receive and understand the discharge instructions provided? Yes  Medications obtained and verified? Yes  Other? No  Any new allergies since your discharge? No  Dietary orders reviewed? No Do you have support at home? Yes   Follow up appointments reviewed:  PCP Hospital f/u appt confirmed? No  Scheduled to see  on  @ . Specialist Hospital f/u appt confirmed? Yes  Scheduled to see orthopedic on 5/9 @ . Are transportation arrangements needed? No  If their condition worsens, is the pt aware to call PCP or go to the Emergency Dept.? Yes Was the patient provided with contact information for the PCP's office or ED? Yes Was to pt encouraged to call back with questions or concerns? Yes

## 2023-02-22 NOTE — Progress Notes (Signed)
Care Management & Coordination Services Pharmacy Note  02/22/2023 Name:  Tina Berger MRN:  [id number] DOB:  Apr 16, 1951  Summary: F/U visit -DM: A1c 7.1% (06/2022); pt reports she has a general sick feeling taking metformin 1000 mg BID; discussed trial of ER form may improve tolerability -HLD: LDL 113 (06/2022), ezetimibe was prescribed at last PCP appt; pt decided not to start taking this since she had "a lot going on" -HypoTH: TSH was 0.197 (06/2022) and LT4 dose was reduced, pt has not had TSH rechecked yet -Recurrent falls: pt had 2 ED visits recently for falls; she claims these were mechanical in nature (slipping), she does not think a walker is feasible for her home -Osteopenia: Last DEXA scan 2020; pt has multiple risk factors for low bone density (chronic prednisone, PPI, low TSH < 1)   Recommendations/Changes made from today's visit: -Changed metformin to XR 500 mg - 2 tab BID.  -Advised to schedule DEXA scan and PCP appt -Repeat lipid panel at next OV; if LDL still above goal, consider re-trial of ezetimibe -Repeat TSH, A1c at next OV  Follow up plan: -Health Concierge will call patient 3 weeks to check on metformin XR -Pharmacist follow up televisit scheduled for 3 months -Pulmonology appt 03/01/23    Subjective: Tina Berger is an 72 y.o. year old female who is a primary patient of Tower, [name], MD.  The care coordination team was consulted for assistance with disease management and care coordination needs.    Engaged with patient by telephone for follow up visit.  Recent office visits: 12/29/22 Dr Milinda Antis OV: ED f/u - hematoma. Frequent falls - multifactorial (neuropathy, chronic pain/narcotic use, deconditioning, polypharmacy). Declined neuro exam. Enc to use walker.   07/13/22 Dr Milinda Antis OV: annual - ordered DEXA. Rx Ezetimibe 10 mg. Reduce Levothyroxine to 125 mcg. Increase metformin to 1000 mg BID.  Recent consult visits: 09/16/22 PA Amada Jupiter (Rheum): f/u RA.  Hospital  visits: 02/14/23 ED visit [facility]): fall, head injury, lacerations. Slipped wearing slippers. CT head negative. Wrist Xray with fracture. Placed splint.   12/26/22 ED visit [facility]): Fall, hematoma. Fell due to dizziness. F/u with ortho.   Objective:  Lab Results  Component Value Date   CREATININE 0.75 12/26/2022   BUN 26 (H) 12/26/2022   GFR 48.18 (L) 11/13/2020   EGFR 86 09/16/2022   GFRNONAA >60 12/26/2022   GFRAA 75 03/24/2021   NA 137 12/26/2022   K 4.8 12/26/2022   CALCIUM 8.5 (L) 12/26/2022   CO2 28 12/26/2022   GLUCOSE 133 (H) 12/26/2022    Lab Results  Component Value Date/Time   HGBA1C 7.1 (H) 07/06/2022 09:08 AM   HGBA1C 6.6 (H) 03/23/2022 11:57 AM   GFR 48.18 (L) 11/13/2020 08:54 AM   GFR 63.03 09/25/2020 09:34 AM    Last diabetic Eye exam:  Lab Results  Component Value Date/Time   HMDIABEYEEXA No Retinopathy 03/04/2022 12:00 AM    Last diabetic Foot exam:  Lab Results  Component Value Date/Time   HMDIABFOOTEX Done 03/18/2010 12:00 AM     Lab Results  Component Value Date   CHOL 181 07/06/2022   HDL 48 07/06/2022   LDLCALC 113 (H) 07/06/2022   LDLDIRECT 149.0 07/26/2016   TRIG 108 07/06/2022   CHOLHDL 3.8 07/06/2022       Latest Ref Rng & Units 12/26/2022    4:38 PM 09/16/2022    4:01 PM 07/06/2022    9:08 AM  Hepatic Function  Total Protein 6.5 -  8.1 g/dL 6.8  7.8  7.1   Albumin 3.5 - 5.0 g/dL 3.6   4.2   AST 15 - 41 U/L 18  14  15    ALT 0 - 44 U/L 13  10  17    Alk Phosphatase 38 - 126 U/L 44   81   Total Bilirubin 0.3 - 1.2 mg/dL 0.6  0.3  0.4     Lab Results  Component Value Date/Time   TSH 0.197 (L) 07/06/2022 09:08 AM   TSH 3.46 03/23/2022 11:57 AM   FREET4 1.1 01/24/2007 09:41 AM       Latest Ref Rng & Units 12/30/2022    4:20 PM 12/26/2022    4:38 PM 09/16/2022    4:01 PM  CBC  WBC 3.8 - 10.8 Thousand/uL 8.3  8.7  8.2   Hemoglobin 11.7 - 15.5 g/dL 16.1  09.6  04.5   Hematocrit 35.0 - 45.0 % 34.9  32.7  40.3   Platelets 140 -  400 Thousand/uL 490  306  396     Lab Results  Component Value Date/Time   VD25OH 52.0 07/06/2022 09:08 AM   VD25OH 48.52 08/20/2020 09:51 AM   VD25OH 50.96 08/17/2019 09:32 AM   VITAMINB12 980 07/06/2022 09:08 AM   VITAMINB12 740 09/25/2020 09:34 AM    Clinical ASCVD: Yes  The 10-year ASCVD risk score (Arnett DK, et al., 2019) is: 54%   Values used to calculate the score:     Age: 73 years     Sex: Female     Is Non-Hispanic African American: No     Diabetic: Yes     Tobacco smoker: Yes     Systolic Blood Pressure: 151 mmHg     Is BP treated: Yes     HDL Cholesterol: 48 mg/dL     Total Cholesterol: 181 mg/dL       01/24/8118    1:47 PM 07/13/2022    2:06 PM 05/05/2022    3:10 PM  Depression screen PHQ 2/9  Decreased Interest 0 0 0  Down, Depressed, Hopeless 0 0 0  PHQ - 2 Score 0 0 0  Altered sleeping 0 0 0  Tired, decreased energy 1 3 0  Change in appetite 1 3 0  Feeling bad or failure about yourself  0 0 0  Trouble concentrating 1 0 0  Moving slowly or fidgety/restless 0 3 0  Suicidal thoughts 0 0 0  PHQ-9 Score 3 9 0  Difficult doing work/chores Not difficult at all Somewhat difficult Not difficult at all     Social History   Tobacco Use  Smoking Status Former   Packs/day: 1.00   Years: 5.00   Additional pack years: 0.00   Total pack years: 5.00   Types: Cigarettes   Quit date: 10/18/1968   Years since quitting: 54.3   Passive exposure: Never  Smokeless Tobacco Never  Tobacco Comments   quit over 40 years   BP Readings from Last 3 Encounters:  02/14/23 (!) 151/83  12/29/22 134/82  12/26/22 (!) 141/81   Pulse Readings from Last 3 Encounters:  02/14/23 88  12/29/22 (!) 114  12/26/22 83   Wt Readings from Last 3 Encounters:  02/14/23 170 lb (77.1 kg)  12/29/22 171 lb 6 oz (77.7 kg)  12/26/22 185 lb (83.9 kg)   BMI Readings from Last 3 Encounters:  02/14/23 33.20 kg/m  12/29/22 33.47 kg/m  12/26/22 34.96 kg/m    Allergies  Allergen  Reactions  Ceftin [Cefuroxime] Swelling    Swelling, "legs turn blue".  Legs swelling, then blue, then a rash   Clonidine Derivatives     Swelling of lips   Erythromycin Swelling    Rash, swollen gums    Keflex [Cephalexin] Anaphylaxis    Chest was broken out in a rash. Lips were swelling. Took a few days to occur, but it kept getting worse.                                                                                                                                                                                                   Penicillins Anaphylaxis    Pt had a daughter with an allergy to this at a young age. Patient developed blisters anywhere her child touched her. Also, if she used the bathroom after daughter did while on keflex, it would cause a reaction for her. Lips and roof of mouth swelled up also.   Sulfonamide Derivatives Swelling    Rash, swollen gums, lips   Ciprofloxacin     REACTION: ? rash vs sun rxn. Rash    Fluoxetine Hcl     REACTION: stomach problems. Severe pain in abdomen. Could not function d/t pain   Furosemide Swelling    REACTION: swelling worsened in legs once taking   Gabapentin Other (See Comments)    REACTION: edema of feet. Unable to get shoes on   Hydroxychloroquine Itching    Pt report itching felt like "pre-anaphylaxis" she has had with other medications   Paroxetine     REACTION: weight gain (30 pound weight gain   Pregabalin     REACTION: swelling of feet and legs. Unable to get shoes on.   Tetracycline     REACTION:inflammed genitals.    Venlafaxine     REACTION: sweating profusely   Etodolac     REACTION: reaction not known   Macrobid [Nitrofurantoin]     REACTION: ? Lung problem    Amitriptyline Hcl     REACTION: sedating   Atorvastatin     REACTION: muscle twitch and pain   Benicar [Olmesartan Medoxomil]     Muscle pain    Cephalexin Hives and Rash   Cetirizine Hcl     REACTION: headache   Diovan [Valsartan]     Thought it  made her feel confused   Naproxen Sodium Other (See Comments)    REACTION: edema of feet and legs.  Not good for ckd    Medications Reviewed Today     Reviewed by Kathyrn Sheriff, [facility] (Pharmacist) on 02/22/23 at 1626  Med List  Status: <None>   Medication Order Taking? Sig Documenting Provider Last Dose Status Informant  Acetaminophen (TYLENOL ARTHRITIS PAIN PO) [id number] Yes Take 650 mg by mouth daily. [provider] Taking Active   buPROPion (WELLBUTRIN XL) 150 MG 24 hr tablet [id number] Yes TAKE 1 TABLET BY MOUTH DAILY Tower, [name], MD Taking Active   Chlorphen-Phenyleph-ASA (ALKA-SELTZER PLUS COLD PO) [id number] Yes Take by mouth as needed. Just for colds [provider] Taking Active Self  cholecalciferol (VITAMIN D3) 25 MCG (1000 UNIT) tablet [id number] Yes Take 1,000 Units by mouth daily. [provider] Taking Active Self  cyclobenzaprine (FLEXERIL) 5 MG tablet [id number] Yes Take 5 mg by mouth daily as needed. [provider] Taking Active   Diclofenac Sodium 1.5 % SOLN [id number] Yes Place 2 mLs onto the skin 4 (four) times daily. Tower, [name], MD Taking Active   DULoxetine (CYMBALTA) 60 MG capsule [id number] Yes Take 1 capsule (60 mg total) by mouth daily. Tower, [name], MD Taking Active   esomeprazole (NEXIUM) 20 MG capsule [id number] Yes TAKE 1 CAPSULE BY MOUTH DAILY Tower, [name], MD Taking Active   famotidine (PEPCID) 40 MG tablet [id number] Yes TAKE 1 TABLET BY MOUTH DAILY Tower, [name], MD Taking Active   ferrous sulfate 325 (65 FE) MG EC tablet [id number] Yes Take 325 mg by mouth daily with breakfast. [provider] Taking Active   glucose blood test strip [id number] Yes One Touch Ultra stripts blue-To check sugar once daily and as needed for DM2 250.00 Tower, [name], MD Taking Active   GUAIFENESIN CR PO [id number] Yes Take by mouth as needed. [provider] Taking Active   HYDROmorphone HCl (EXALGO) 12 MG T24A SR tablet  [id number] Yes Take 12 mg by mouth 2 (two) times daily. [provider] Taking Active Self  hyoscyamine (LEVSIN SL) 0.125 MG SL tablet [id number] Yes TAKE 1 TABLET BY MOUTH EVERY 4 HOURS AS NEEDED FOR CRAMPING. Tower, [name], MD Taking Active   ketoconazole (NIZORAL) 2 % shampoo [id number] Yes SHAMPOO WITH A SMALL AMOUNT AS DIRECTED THREE TIMES A WEEK SHAMPOO SCALP 3 DAYS A WEEK, LET SHAMPOO SIT FOR 10 MINUTES AND RINSE OFF [place], [place], MD Taking Active   levothyroxine (SYNTHROID) 125 MCG tablet [id number] Yes Take 1 tablet (125 mcg total) by mouth daily. Tower, [name], MD Taking Active   LYSINE PO [id number] Yes Take by mouth as needed. OTC for ulcer prophylaxis [provider] Taking Active   Magnesium 400 MG CAPS [id number] Yes Take by mouth daily. [provider] Taking Active   metFORMIN (GLUCOPHAGE-XR) 500 MG 24 hr tablet [id number] Yes Take 2 tablets (1,000 mg total) by mouth 2 (two) times daily with a meal. Tower, [name], MD Taking Active   metoprolol succinate (TOPROL-XL) 50 MG 24 hr tablet [id number] Yes TAKE 1 TABLET BY MOUTH TWICE  DAILY Tower, [name], MD Taking Active   naloxone [facility]) nasal spray 4 mg/0.1 mL [id number] Yes SMARTSIG:Both Nares [provider] Taking Active   ondansetron (ZOFRAN) 4 MG tablet [id number] Yes Take 1 tablet (4 mg total) by mouth every 8 (eight) hours as needed for nausea or vomiting. Gearldine Bienenstock, PA-C Taking Active   oxyCODONE (OXYCONTIN) 10 MG 12 hr tablet [id number] Yes Take 10 mg by mouth 3 (three) times daily as needed. Beakthrough pain [provider] Taking Active Self  predniSONE (DELTASONE) 1 MG tablet [id number] Yes TAKE 2 TABLETS BY MOUTH ONCE WITH BREAKFAST , ALONG  WITH 5MG  TO EQUAL 7MG  DAILY Deveshwar, Janalyn Rouse, MD Taking Active   predniSONE (DELTASONE) 10 MG tablet [id number] Yes Take 1 tablet (10 mg total) by mouth daily with breakfast. Gearldine Bienenstock, PA-C Taking Active   predniSONE (DELTASONE) 5 MG tablet  [id number] Yes Take 2 tablets (10 mg total) by mouth daily. Gearldine Bienenstock, PA-C Taking Active   ramipril (ALTACE) 5 MG capsule [id number] Yes TAKE 1 CAPSULE BY MOUTH DAILY Tower, [name], MD Taking Active   rosuvastatin (CRESTOR) 5 MG tablet [id number] Yes TAKE 1 TABLET BY MOUTH TWICE  WEEKLY Tower, [name], MD Taking Active   tiZANidine (ZANAFLEX) 4 MG tablet [id number] Yes Take 4 mg by mouth every 8 (eight) hours as needed for muscle spasms. [provider] Taking Active   Tocilizumab (ACTEMRA ACTPEN) 162 MG/0.9ML SOAJ [id number] Yes Inject 162 mg into the skin every 14 (fourteen) days. Pollyann Savoy, MD Taking Active             SDOH:  (Social Determinants of Health) assessments and interventions performed: No SDOH Interventions    Flowsheet Row Office Visit from 07/13/2022 in [facility] Clinical Support from 05/05/2022 in [facility] Chronic Care Management from 09/28/2021 in [facility] Clinical Support from 08/20/2020 in [facility] Clinical Support from 08/16/2019 in [facility] [place]  SDOH Interventions       Food Insecurity Interventions -- Intervention Not Indicated -- -- --  Housing Interventions -- Intervention Not Indicated -- -- --  Transportation Interventions -- Intervention Not Indicated -- -- --  Depression Interventions/Treatment  Counseling -- -- PHQ2-9 Score <4 Follow-up Not Indicated PHQ2-9 Score <4 Follow-up Not Indicated  Financial Strain Interventions -- Intervention Not Indicated Intervention Not Indicated -- --  Physical Activity Interventions -- Intervention Not Indicated -- -- --  Stress Interventions -- Intervention Not Indicated -- -- --  Social Connections Interventions -- Intervention Not Indicated -- -- --       Medication Assistance: None required.  Patient affirms current  coverage meets needs.  Medication Access: Within the past 30 days, how often has patient missed a dose of medication? 0 Is a pillbox or other method used to improve adherence? Yes  Factors that may affect medication adherence? adverse effects of medications Are meds synced by current pharmacy? No  Are meds delivered by current pharmacy? No  Does patient experience delays in picking up medications due to transportation concerns? No   [facility] Services Reviewed: Is patient disadvantaged to use [facility]?: Yes  Current Rx insurance plan: [facility] Name and location of Current pharmacy:  [facility] - [place], [place] - [address] [place] [address] Phone: [phone] Fax: [phone]  [id number] - [place], [place] - [address] [place] [address] Phone: [phone] Fax: [phone]  [facility] services reviewed with patient today?: No  Patient requests to transfer care to [facility] Pharm[facility]?: No  Reason patient declined to change pharmacies: Disadvantaged due to insurance/mail order  Compliance/Adherence/Medication fill history: Care Gaps: Cologuard (due 08/2019) A1c (due 12/2022) - ordered 08/2022  Star-Rating Drugs: Rosuvastatin - PDC 100% Ramipril - PDC 100% Metformin - PDC 100%   ASSESSMENT / PLAN  Hypertension (BP goal <140/90) -Uncontrolled - per recent OV last BP 134/82 -Current home BP readings: n/a -History of  palpitations, CAD, bilateral edema followed by cardiology  -Current treatment: Metoprolol succinate 50 mg BID - Appropriate, Query Effective Ramipril 5 mg daily -Appropriate, Query Effective -Medications previously tried: spironolactone, olmesartan, HCTZ, diltiazem, clonidine, torsemide -Educated on BP goals and benefits of medications for prevention of heart attack, stroke and kidney damage; -Counseled to monitor BP at home 3x weekly; call provider if  persistently > 140/90 -Recommended to continue current medication  Hyperlipidemia / CAD (LDL goal < 70) -Not ideally controlled - LDL 113 (06/2022) above goal; peak 163 in 2010; she did not end up starting ezetimibe after last PCP visit after reading about side effects  -Pt reports prior intolerance to multiple statins. Rosuvastatin 5 mg twice weekly is the first one she has tolerated -Hx aortic atherosclerosis, CAD.  -Current treatment: Rosuvastatin 5 mg twice a week - Appropriate, Effective, Safe, Accessible Ezetimibe 10 mg daily - not taking -Medications previously tried: atorvastatin -Educated on Cholesterol goals; benefits of statin/ezetimibe; discussed it is reasonable to wait until after next lipid panel to consider ezetimibe again -Recommended to continue current medication  Diabetes (A1c goal <7%) -Not ideally controlled - A1c 7.1% (06/2022); pt reports metformin dose was increased at that time and makes her feel generally "sick" -Current home glucose readings - she checks BG 3x week due to sore fingers; she does not write down numbers  -she inquired about CGM, discussed insurance coverage requirements/OOP cost. She states it would be unaffordable  -Denies hypoglycemic/hyperglycemic symptoms -Current medications: Metformin 1000 mg BID - Appropriate, Query effective  Testing supplies -Medications previously tried: none  -Educated on A1c and blood sugar goals; advised to write down blood sugars -Changed metformin to XR 500 mg - 2 tab BID; advised to make PCP appt in next 1-2 months to recheck A1c  Depression/Anxiety (Goal: Improve mood) -Controlled -PHQ9: 0 (03/2022) - minimal depression -GAD7: reports ongoing anxiety, situational stress, no GAD completed -Current treatment: Bupropion 150 mg 24 hr daily (PM) -Appropriate, Effective, Safe, Accessible Duloxetine 60 mg daily - Appropriate, Effective, Safe, Accessible. -Medications previously tried/failed: fluoxetine (GI pain),  paroxetine (wt gain), venlafaxine (sweating), amitriptyline (sedation) -Educated on Benefits of medication for symptom control;Benefits of cognitive-behavioral therapy with or without medication -Recommended to continue current medication  Rheumatoid Arthritis (Goal: Improve symptoms) -Stable -Follows with rheumatology and pain mgmt; per pt rheumatology has told her there is nothing else available to help with RA -Current treatment  Prednisone 10 mg daily -Appropriate, Effective, Query Safe Actemra (Tocilizumab) 162 mg q14 days - Appropriate, Effective, Safe, Accessible -Medications previously tried: leflunomide (mood changes) -Reviewed long term risks of chronic prednisone use; -Recommended to continue current medication  Chronic pain (Goal: manage pain) -Controlled -Follows with pain mgmt (NP Arlan Organ). Hx RA, DDD (lumbar, cervical, thoracic), osteoarthritis. -Pt interested in seeing pain mgmt in [place] rather than [place]; she does not want to go back to [facility] clinic -Current treatment  Hydromorphone 12 mg BID -Appropriate, Effective, Safe, Accessible Oxycodone 10 mg TID prn -Appropriate, Effective, Safe, Accessible Tylenol 650 mg PRN -Appropriate, Effective, Safe, Accessible Diclofenac 1.5% solution -Appropriate, Effective, Safe, Accessible Cyclobenzaprine 5 mg PRN -Appropriate, Effective, Safe, Accessible Duloxetine 60 mg daily - Appropriate, Effective, Safe, Accessible Naloxone nasal spray PRN - Appropriate, Effective, Safe, Accessible -Medications previously tried: pregabalin, gabapentin, venlfaxine, naproxen, etodolac, amitriptyline -Recommended to continue current medication  Osteopenia (Goal Prevent fractures) Not ideally controlled - due for repeat DEXA -Recurrent falls (ED 12/2022, 01/2023 for falls); pt has multiple risk factors for low bone density: chronic prednisone, PPI,  TSH < 1 -Last DEXA Scan: 10/2018   T-Score total hip: -1.5  T-Score lumbar spine:  +0.4  10-year probability of major osteoporotic fracture: 14.4%  10-year probability of hip fracture: 1.8% -Patient is not a candidate for pharmacologic treatment -Current treatment  Vitamin D 1000 IU daily -Appropriate, Effective, Safe, Accessible -Medications previously tried: n/a  -Recommend 1200 mg of calcium daily from dietary and supplemental sources. -Recommended to continue current medication; advised pt to reschedule DEXA scan  Hypothyroidism (Goal: Improve symptoms, TSH T4 WNL) -Query Controlled - TSH 0.197 (06/2022), dose was reduced and pt has not had TSH rechecked yet -Current treatment  Levothyroxine 125 mcg daily -Appropriate, Effective, Safe, Accessible -Medications previously tried: none  -Recommended to continue current medication; recheck TSH at next OV  Recurrent falls  -Pt had 2 ED visits over the past 2 months for falls -Pt reports both times were due to slipping/poor footwear; she is not using a walker (she claims her house is too small/furniture too big to allow for this); she has a cane that she uses occasionally for balance  Health Maintenance -Vaccine gaps: none -IBS/cramping: hyoscyamine PRN -GERD: on nexium, famotidine -OTC: ferrous sulfate, vitamin C, lyseine, mucinex, alka selzter, magnesium    Al Corpus, PharmD, BCACP Clinical Pharmacist [facility] [phone]

## 2023-02-24 DIAGNOSIS — M1812 Unilateral primary osteoarthritis of first carpometacarpal joint, left hand: Secondary | ICD-10-CM | POA: Diagnosis not present

## 2023-02-24 DIAGNOSIS — E785 Hyperlipidemia, unspecified: Secondary | ICD-10-CM | POA: Diagnosis not present

## 2023-02-24 DIAGNOSIS — M25532 Pain in left wrist: Secondary | ICD-10-CM | POA: Diagnosis not present

## 2023-02-24 DIAGNOSIS — E1169 Type 2 diabetes mellitus with other specified complication: Secondary | ICD-10-CM | POA: Diagnosis not present

## 2023-02-24 DIAGNOSIS — S62112A Displaced fracture of triquetrum [cuneiform] bone, left wrist, initial encounter for closed fracture: Secondary | ICD-10-CM | POA: Diagnosis not present

## 2023-02-25 ENCOUNTER — Other Ambulatory Visit: Payer: Self-pay | Admitting: Physician Assistant

## 2023-02-28 ENCOUNTER — Other Ambulatory Visit: Payer: Self-pay

## 2023-02-28 DIAGNOSIS — M359 Systemic involvement of connective tissue, unspecified: Secondary | ICD-10-CM

## 2023-02-28 NOTE — Patient Instructions (Signed)
Interstitial lung disease due to connective tissue disease (HCC) Drug allergy, multiple  Rheumatoid arthritis of multiple sites with negative rheumatoid factor (HCC) Raynaud's disease without gangrene  -You have very mild interstitial lung disease and this is due to rheumatoid arthritis   - [based on symptoms, walk test, pulmonary function test and CT scan] -Since 2013 there is very mild progression through 2022  - aug 2022 -> June 2023 is stable   Plan - continue supportive care  - continue actemra through Dr D for RA   - can be beneficial to lungs -Do repeat pulmonary function test spirometry and DLCO in 6 months [can be done at [facility] in [place]] - FLu and RSV shot (any inactivated but not live vaccine) - avoid respiratory infections - mask up indoors and human clusters   Follow-up - 6 months do spirometry and DLCO on return to see Dr. Amond Speranza for follow-up in a 15-minute slot  -Symptom score and simple walking desaturation test at follow-up  

## 2023-02-28 NOTE — Progress Notes (Unsigned)
OV 06/02/2021  Subjective:  Patient ID: Tina Berger, female , DOB: 1951-09-14 , age 71 y.o. , MRN: [id number] , ADDRESS: [place] [id number] PCP Tower, [name], MD Patient Care Team: Tower, [name], MD as PCP - General [name], [name], MD as Consulting Physician (Cardiology) [name], [facility] as Pharmacist (Pharmacist)  This Provider for this visit: Treatment Team:  Attending Provider: Brand Males, MD    06/02/2021 -   Chief Complaint  Patient presents with   Consult    Pt is being referred by Dr. Estanislado Pandy for ILD work up. Pt had PFT performed today.  Pt states that she has had problems with SOB for years but it has been just getting worse.     HPI Tina Berger 72 y.o. -referred by Dr. Estanislado Pandy for concern of rheumatoid arthritis ILD.  History is provided by the patient and also review of the records.  She tells me that in 2013 she used to work at [employer] and was exposed to chlorine at work and a few days later there was a birthday party and in the swimming pool there is a lot of chlorine.  And a few days after that started having significant shortness of breath with exertion relieved by rest.  Was hypoxemic was admitted for 5 days.  During this time she was also nitrofurantoin and was informed she had ILD because of chlorine exposure and nitrofurantoin.  After that she recovered and has been stable without any respiratory symptoms.  Then starting October 2021 started noticing increased fatigue and also in decreased mobility and hand and joint stiffness.  She did see Dr. Keturah Barre in March and April 2022.  Was given a diagnosis of rheumatoid arthritis negative rheumatoid factor positive CCP.  Mixed with methotrexate was discussed but later patient declined it.  Then in April 2022 started Imuran.  She tells me she took Imuran for 8 weeks but then had vomiting fatigue fogginess and also scared about getting leukemia because she lost her son from  leukemia.  Therefore she stopped it.  She feels prednisone will help her a lot but she says that she has been advised about the side effects of prednisone.  She has multiple drug allergies.  She says because of her ILD she was advised about 2 options for her rheumatoid arthritis.  I am presuming based on chart review this is methotrexate and Imuran.  She says the sores are too restrictive.  She specifically wants to know how I would treat her ILD.   Integrated Comprehensive ILD Questionnaire  Symptoms:    SYMPTOM SCALE - ILD 06/02/2021   O2 use ra  Shortness of Breath 0 -> 5 scale with 5 being worst (score 6 If unable to do)  At rest 0  Simple tasks - showers, clothes change, eating, shaving 0  Household (dishes, doing bed, laundry) 0  Shopping 0  Walking level at own pace 0  Walking up Stairs 0  Total (30-36) Dyspnea Score 0  How bad is your cough? 0  How bad is your fatigue yes  How bad is nausea 0  How bad is vomiting?  0  How bad is diarrhea? 0  How bad is anxiety? ?  How bad is depression ?      Past Medical History : Positive for rheumatoid arthritis diagnosed in 2022.  She has history of irregular heart rate not otherwise specified.  She sees Dr. [name].  She says  she has had echocardiogram and stress test 2010 years ago.  She is on Lopressor.  She has coronary artery calcification on the CT but she says she sees a cardiologist regularly.  She also has GERD and hiatal hernia.  She has diabetes she has thyroid disease not otherwise specified.  She has obesity   has a past medical history of Acute renal insufficiency (05/31/2014), Anemia, iron deficiency, Anxiety, Bilateral lower extremity edema, Cervical dysplasia, Colon cancer screening (06/14/2014), Coronary artery disease, Cough (08/08/2012), Degenerative disk disease (11/19/2011), Depression, Diabetes mellitus type II, Diverticulosis, DJD (degenerative joint disease), Drug rash (05/22/2011), Dry eyes, Dysrhythmia,  Edema, Elevated liver enzymes (03/21/2012), Encounter for routine gynecological examination (06/14/2014), ESOPHAGITIS (03/28/2007), Fibromyalgia, GASTRITIS (03/28/2007), GERD (gastroesophageal reflux disease), Hemorrhoids, HLD (hyperlipidemia), HNP (herniated nucleus pulposus) (11/1997), HTN (hypertension), Hyperglycemia (05/13/2008), Hypothyroidism, Interstitial lung disease ([place]), Left ovarian cyst, Osteoarthritis, Osteopenia, Other screening mammogram (08/18/2011), Palpitations, 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   has a past surgical history that includes Hand surgery (Left); Colonoscopy (08/2000); LASIK; Abdominal exploration surgery; ACDF (1991); and Biopsy of skin subcutaneous tissue and/or mucous membrane (Left, 04/08/2021).    ROS: She has significant fatigue she has arthralgia.  She has Raynaud's she has dry eyes dry mouth.  She has acid reflux.     FAMILY HISTORY of LUNG DISEASE: Positive for asthma in the family not otherwise specified but negative for COPD or pulmonary fibrosis or collagen vascular disease or vasculitis   EXPOSURE HISTORY: Smoked between 82 and 1970 to 10 cigarettes/day.  Does not smoke marijuana but no pipe no cocaine no marijuana use.  No intravenous drug use    HOME and HOBBY DETAILS : [place]  She has lived in the home for 63 years the age of the home is 100.  Built in 1969.  No dampness.  No mold or mildew in the house no humidifier use no CPAP use no nebulizer use no steam iron use no Jacuzzi use.  No misting [place] no parakeets inside the house no pet gerbils or hamsters.  No feather pillows or do way.  No mold in the [facility] duct.  No music habits no gardening habits no bird  feathers.  There was mold and mildew in the bathroom at work but that was in the remote past has not worked in many years.  The building was 73 years old   OCCUPATIONAL HISTORY (122 questions) : No organic or inorganic antigen exposure history.  Greater than 122 item   PULMONARY TOXICITY HISTORY (27 items): She did take Macrobid/nitrofurantoin in 2013.  She took Imuran for 8 weeks in 2022       Narrative & Impression  CLINICAL DATA:  72 year old female with history of shortness of breath. History of rheumatoid arthritis. Shortness of breath with activity.   EXAM: CT CHEST WITHOUT CONTRAST   TECHNIQUE: Multidetector CT imaging of the chest was performed following the standard protocol without intravenous contrast. High resolution imaging of the lungs, as well as inspiratory and expiratory imaging, was performed.   COMPARISON:  Chest CT 04/03/2012.   FINDINGS: Cardiovascular: Heart size is normal. There is no significant pericardial fluid, thickening or pericardial calcification. There is aortic atherosclerosis, as well as atherosclerosis of the great vessels of the mediastinum and the coronary arteries, including calcified atherosclerotic plaque in the  left main, left anterior descending and right coronary arteries.   Mediastinum/Nodes: No pathologically enlarged mediastinal or hilar lymph nodes. Please note that accurate exclusion of hilar adenopathy is limited on noncontrast CT scans. Esophagus is unremarkable in appearance. No axillary lymphadenopathy.   Lungs/Pleura: High-resolution images demonstrate patchy areas of ground-glass attenuation, septal thickening, mild subpleural reticulation and peripheral bronchiolectasis scattered throughout the lungs bilaterally. Findings have a definitive craniocaudal gradient and appear mildly progressive compared to the prior study, with slightly less ground-glass attenuation but more septal thickening and subpleural  reticulation. No frank honeycombing. Inspiratory and expiratory imaging demonstrates some mild air trapping indicative of small airways disease. In addition, there is collapse of the trachea and mainstem bronchi during expiration. No acute consolidative airspace disease. No pleural effusions. No suspicious appearing pulmonary nodules or masses are noted.   Upper Abdomen: Unremarkable.   Musculoskeletal: There are no aggressive appearing lytic or blastic lesions noted in the visualized portions of the skeleton.   IMPRESSION: 1. The appearance of the lungs is compatible with interstitial lung disease which is mildly progressive when compared to remote prior study from 9 years ago. The spectrum of findings on today's examination is categorized as probable usual interstitial pneumonia (UIP) per current ATS guidelines. 2. Aortic atherosclerosis, in addition to left main and 2 vessel coronary artery disease. Assessment for potential risk factor modification, dietary therapy or pharmacologic therapy may be warranted, if clinically indicated.   Aortic Atherosclerosis (ICD10-I70.0).     Electronically Signed   By: Trudie Reed M.D.   On: 01/19/2021 18:20       OV 03/26/2022  Subjective:  Patient ID: Tina Berger, female , DOB: May 06, 1951 , age 89 y.o. , MRN: [id number] , ADDRESS: [address] [place] [address] PCP Tower, [name], MD Patient Care Team: Tower, [name], MD as PCP - General Mariah Milling, Tollie Pizza, MD as Consulting Physician (Cardiology) Phil Dopp, [facility] as Pharmacist (Pharmacist)  This Provider for this visit: Treatment Team:  Attending Provider: Kalman Shan, MD    03/26/2022 -   Chief Complaint  Patient presents with   Follow-up    PFT performed today.  Pt states she has been doing okay since last visit and denies any complaints.     HPI SHADIA RALPH 72 y.o. -presents for follow-up.  She is on observation from a pulmonary perspective  for interstitial lung disease.  This is because she has mild disease burden and we do not know if it is progressive phenotype as yet.  Since her last visit from a respiratory standpoint she says she is stable.  In fact her lung function is showing stability with FVC slightly better and the DLCO being the same.  They actually look normal.  Her main issue is that she is continuing to have significant issues with rheumatoid arthritis.  She did not tolerate leflunomide it caused some knuckle swelling confusing it for rheumatoid arthritis itself but when she stopped leflunomide approximately a month ago this got better.  She is on Actemra which she is tolerating well.  She has been on Actemra since October 2022 she is having significant joint pain.  Early in the morning her pain can be 5 out of 5.  She follows with Dr. Algis Downs for this.  Her pulmonary function test and walking desaturation test are stable.    OV 03/01/2023  Subjective:  Patient ID: Tina Berger, female , DOB: Jun 08, 1951 , age 20 y.o. , MRN: [id number] , ADDRESS: [address] [place] [facility] [address] PCP Tower, [name], MD Patient Care Team: Tower, [name], MD as PCP - General Mariah Milling, Tollie Pizza, MD as Consulting Physician (Cardiology) Kathyrn Sheriff, [facility] as Pharmacist (Pharmacist)  This Provider for this visit: Treatment Team:  Attending Provider: Kalman Shan, MD  Follow-up rheumatoid arthritis ILD -Mild disease burden  - Not on antifibrotic High risk prescription with immunosuppressed status on Actemra -since October 2022.  03/01/2023 -   Chief Complaint  Patient presents with   Follow-up    F/up on PFT     HPI Tina Berger 72 y.o. -returns for follow-up.  I could not get a good finger pulse oximetry on her because of her Raynaud's history.  Nevertheless she feels stable from a respiratory standpoint.  Her symptom score is 0.  Her main issue is her rheumatoid arthritis.  She has had 3 falls since December 2023.   3 of them backwards.  The most recent one was February 14, 2023 when she slipped on a cat toy on the kitchen hardwood floor and she sustained a fracture to her left wrist but also significant bruising to her left side of the face.  In fact the bruising is still evident today.  She is working on her balance issues.  She plans to join a balance class.  She is in significant pain from her rheumatoid arthritis.  She continues on Actemra.  She is on prednisone which she likes to get off.  She is also on pain medicine clinic.  She plans to transfer to rheumatology in [place] because this would be closer to her house.  But she is still plans to come here for pulmonary studies.  She had PFT today shows a slight decline but it could be range bound.  She is willing for a closer follow-up.  Last CT scan of the chest was in April 2022.     SYMPTOM SCALE - ILD 06/02/2021  03/26/2022  03/01/2023   O2 use ra ra ra  Shortness of Breath 0 -> 5 scale with 5 being worst (score 6 If unable to do)    At rest 0 0 0  Simple tasks - showers, clothes change, eating, shaving 0 1 0  Household (dishes, doing bed, laundry) 0 2 0  Shopping 0 2 0  Walking level at own pace 0 2 0  Walking up Stairs 0 3 0  Total (30-36) Dyspnea Score 0 10 0  How bad is your cough? 0 0 0  How bad is your fatigue yes 4 0  How bad is nausea 0 1 0  How bad is vomiting?  0 0 0  How bad is diarrhea? 0 0 0  How bad is anxiety? ? 2 0  How bad is depression ? 2 0  Pain  5 0     Simple office walk 185 feet x  3 laps goal with forehead probe 06/02/2021  03/26/2022   O2 used ra   Number laps completed 3   Comments about pace avg   Resting Pulse Ox/HR 97% and 71/min 95% and HR 80  Final Pulse Ox/HR 93% and 105/min 93% and HR 107  Desaturated </= 88% no no  Desaturated <= 3% points Yes 4 points Yes 2 points  Got Tachycardic >/= 90/min yes yes  Symptoms at end of test Moderate dyspnea Mild dyspnea   Miscellaneous comments x     PFT      Latest Ref Rng & Units 03/01/2023  8:51 AM 03/26/2022   10:15 AM 06/02/2021   12:50 PM  PFT Results  FVC-Pre L 2.22  P 2.35  2.16   FVC-Predicted Pre % 87  P 91  79   FVC-Post L   2.14   FVC-Predicted Post %   78   Pre FEV1/FVC % % 83  P 85  81   Post FEV1/FCV % %   86   FEV1-Pre L 1.84  P 2.00  1.75   FEV1-Predicted Pre % 97  P 103  85   FEV1-Post L   1.83   DLCO uncorrected ml/min/mmHg 13.10  P 15.17  14.84   DLCO UNC% % 75  P 87  81   DLCO corrected ml/min/mmHg 13.99  P 14.70  14.84   DLCO COR %Predicted % 80  P 84  81   DLVA Predicted % 89  P 86  104   TLC L   4.29   TLC % Predicted %   90   RV % Predicted %   89     P Preliminary result       has a past medical history of Acute renal insufficiency (05/31/2014), Anemia, iron deficiency, Anxiety, Bilateral lower extremity edema, Cervical dysplasia, Colon cancer screening (06/14/2014), Coronary artery disease, Cough (08/08/2012), Degenerative disk disease (11/19/2011), Depression, Diabetes mellitus type II, Diverticulosis, DJD (degenerative joint disease), Drug rash (05/22/2011), Dry eyes, Dysrhythmia, Edema, Elevated liver enzymes (03/21/2012), Encounter for routine gynecological examination (06/14/2014), ESOPHAGITIS (03/28/2007), Fibromyalgia, GASTRITIS (03/28/2007), GERD (gastroesophageal reflux disease), Hemorrhoids, HLD (hyperlipidemia), HNP (herniated nucleus pulposus) (11/1997), HTN (hypertension), Hyperglycemia (05/13/2008), Hypothyroidism, Interstitial lung disease (HCC), Left ovarian cyst, Osteoarthritis, Osteopenia, Other screening mammogram (08/18/2011), Palpitations, 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   reports that she quit smoking about 54 years ago. Her smoking use  included cigarettes. She has a 5.00 pack-year smoking history. She has never been exposed to tobacco smoke. She has never used smokeless tobacco.  Past Surgical History:  Procedure Laterality Date   ABDOMINAL EXPLORATION SURGERY     ACDF  1991   BIOPSY OF SKIN SUBCUTANEOUS TISSUE AND/OR MUCOUS MEMBRANE Left 04/08/2021   Procedure: BIOPSY OF SKIN SUBCUTANEOUS TISSUE AND/OR MUCOUS MEMBRANE ( REMOVAL SKIN NODULE);  Surgeon: Renford Dills, MD;  Location: [facility] ORS;  Service: Vascular;  Laterality: Left;   COLONOSCOPY  08/2000   Diverticulosis; hemorrhoids   HAND SURGERY Left    left thumb. PIN REMOVED   LASIK     bilateral    Allergies  Allergen Reactions   Ceftin [Cefuroxime] Swelling    Swelling, "legs turn blue".  Legs swelling, then blue, then a rash   Clonidine Derivatives     Swelling of lips   Erythromycin Swelling    Rash, swollen gums    Keflex [Cephalexin] Anaphylaxis    Chest was broken out in a rash. Lips were swelling. Took a few days to occur, but it kept getting worse.  Penicillins Anaphylaxis    Pt had a daughter with an allergy to this at a young age. Patient developed blisters anywhere her child touched her. Also, if she used the bathroom after daughter did while on keflex, it would cause a reaction for her. Lips and roof of mouth swelled up also.   Sulfonamide Derivatives Swelling    Rash, swollen gums, lips   Ciprofloxacin     REACTION: ? rash vs sun rxn. Rash    Fluoxetine Hcl     REACTION: stomach problems. Severe pain in abdomen. Could not function d/t pain   Furosemide Swelling    REACTION: swelling worsened in legs once taking   Gabapentin Other (See Comments)    REACTION: edema of feet. Unable to get shoes on   Hydroxychloroquine Itching    Pt report itching felt  like "pre-anaphylaxis" she has had with other medications   Paroxetine     REACTION: weight gain (30 pound weight gain   Pregabalin     REACTION: swelling of feet and legs. Unable to get shoes on.   Tetracycline     REACTION:inflammed genitals.    Venlafaxine     REACTION: sweating profusely   Etodolac     REACTION: reaction not known   Macrobid [Nitrofurantoin]     REACTION: ? Lung problem    Amitriptyline Hcl     REACTION: sedating   Atorvastatin     REACTION: muscle twitch and pain   Benicar [Olmesartan Medoxomil]     Muscle pain    Cephalexin Hives and Rash   Cetirizine Hcl     REACTION: headache   Diovan [Valsartan]     Thought it made her feel confused   Naproxen Sodium Other (See Comments)    REACTION: edema of feet and legs.  Not good for ckd    Immunization History  Administered Date(s) Administered   Fluad Quad(high Dose 65+) 08/07/2020, 07/13/2022   Influenza Split 06/19/2011   Influenza Whole 08/01/2009   Influenza, High Dose Seasonal PF 08/14/2018, 07/03/2019   Influenza,inj,Quad PF,6+ Mos 06/14/2014, 08/25/2015, 07/26/2016, 08/19/2017   Moderna Sars-Covid-2 Vaccination 09/17/2020   PFIZER(Purple Top)SARS-COV-2 Vaccination 11/27/2019, 12/18/2019   Pneumococcal Conjugate-13 08/25/2015   Pneumococcal Polysaccharide-23 08/25/2016   Td 11/25/2003   Tdap 06/14/2014   Zoster, Live 09/16/2014    Family History  Problem Relation Age of Onset   Lung cancer Mother        + smoker   Coronary artery disease Mother        relatively young   Emphysema Father        + smoker   Lymphoma Sister    Heart disease Brother    Lymphoma Brother    Parkinson's disease Brother    Anemia Brother        aplastic    Diabetes Brother    Iron deficiency Daughter    Leukemia Son    Breast cancer Neg Hx      Current Outpatient Medications:    Acetaminophen (TYLENOL ARTHRITIS PAIN PO), Take 650 mg by mouth daily., Disp: , Rfl:    buPROPion (WELLBUTRIN XL) 150 MG 24 hr  tablet, TAKE 1 TABLET BY MOUTH DAILY, Disp: 90 tablet, Rfl: 3   Chlorphen-Phenyleph-ASA (ALKA-SELTZER PLUS COLD PO), Take by mouth as needed. Just for colds, Disp: , Rfl:    cholecalciferol (VITAMIN D3) 25 MCG (1000 UNIT) tablet, Take 1,000 Units by mouth daily., Disp: , Rfl:    cyclobenzaprine (FLEXERIL) 5 MG tablet, Take 5  mg by mouth daily as needed., Disp: , Rfl:    Diclofenac Sodium 1.5 % SOLN, Place 2 mLs onto the skin 4 (four) times daily., Disp: 3 Bottle, Rfl: 3   DULoxetine (CYMBALTA) 60 MG capsule, Take 1 capsule (60 mg total) by mouth daily., Disp: 90 capsule, Rfl: 3   esomeprazole (NEXIUM) 20 MG capsule, TAKE 1 CAPSULE BY MOUTH DAILY, Disp: 90 capsule, Rfl: 3   famotidine (PEPCID) 40 MG tablet, TAKE 1 TABLET BY MOUTH DAILY, Disp: 90 tablet, Rfl: 3   ferrous sulfate 325 (65 FE) MG EC tablet, Take 325 mg by mouth daily with breakfast., Disp: , Rfl:    glucose blood test strip, One Touch Ultra stripts blue-To check sugar once daily and as needed for DM2 250.00, Disp: 100 each, Rfl: 3   GUAIFENESIN CR PO, Take by mouth as needed., Disp: , Rfl:    HYDROmorphone HCl (EXALGO) 12 MG T24A SR tablet, Take 12 mg by mouth 2 (two) times daily., Disp: , Rfl:    hyoscyamine (LEVSIN SL) 0.125 MG SL tablet, TAKE 1 TABLET BY MOUTH EVERY 4 HOURS AS NEEDED FOR CRAMPING., Disp: 270 tablet, Rfl: 1   ketoconazole (NIZORAL) 2 % shampoo, SHAMPOO WITH A SMALL AMOUNT AS DIRECTED THREE TIMES A WEEK SHAMPOO SCALP 3 DAYS A WEEK, LET SHAMPOO SIT FOR 10 MINUTES AND RINSE OFF, Disp: 120 mL, Rfl: 2   levothyroxine (SYNTHROID) 125 MCG tablet, Take 1 tablet (125 mcg total) by mouth daily., Disp: 90 tablet, Rfl: 3   LYSINE PO, Take by mouth as needed. OTC for ulcer prophylaxis, Disp: , Rfl:    Magnesium 400 MG CAPS, Take by mouth daily., Disp: , Rfl:    metFORMIN (GLUCOPHAGE-XR) 500 MG 24 hr tablet, Take 2 tablets (1,000 mg total) by mouth 2 (two) times daily with a meal., Disp: 120 tablet, Rfl: 0   metoprolol succinate  (TOPROL-XL) 50 MG 24 hr tablet, TAKE 1 TABLET BY MOUTH TWICE  DAILY, Disp: 180 tablet, Rfl: 1   naloxone (NARCAN) nasal spray 4 mg/0.1 mL, SMARTSIG:Both Nares, Disp: , Rfl:    ondansetron (ZOFRAN) 4 MG tablet, Take 1 tablet (4 mg total) by mouth every 8 (eight) hours as needed for nausea or vomiting., Disp: 20 tablet, Rfl: 0   oxyCODONE (OXYCONTIN) 10 MG 12 hr tablet, Take 10 mg by mouth 3 (three) times daily as needed. Beakthrough pain, Disp: , Rfl:    predniSONE (DELTASONE) 1 MG tablet, TAKE 2 TABLETS BY MOUTH ONCE WITH BREAKFAST , ALONG WITH 5MG  TO EQUAL 7MG  DAILY, Disp: 60 tablet, Rfl: 1   predniSONE (DELTASONE) 10 MG tablet, Take 1 tablet (10 mg total) by mouth daily with breakfast., Disp: 30 tablet, Rfl: 2   predniSONE (DELTASONE) 5 MG tablet, Take 2 tablets (10 mg total) by mouth daily., Disp: 60 tablet, Rfl: 2   ramipril (ALTACE) 5 MG capsule, TAKE 1 CAPSULE BY MOUTH DAILY, Disp: 90 capsule, Rfl: 3   rosuvastatin (CRESTOR) 5 MG tablet, TAKE 1 TABLET BY MOUTH TWICE  WEEKLY, Disp: 23 tablet, Rfl: 1   tiZANidine (ZANAFLEX) 4 MG tablet, Take 4 mg by mouth every 8 (eight) hours as needed for muscle spasms., Disp: , Rfl:    Tocilizumab (ACTEMRA ACTPEN) 162 MG/0.9ML SOAJ, Inject 162 mg into the skin every 14 (fourteen) days., Disp: 5.4 mL, Rfl: 0      Objective:   Vitals:   03/01/23 0954  BP: (!) 120/90  Pulse: 86  SpO2: 94%  Weight: 176 lb 3.2  oz (79.9 kg)  Height: 5' (1.524 m)    Estimated body mass index is 34.41 kg/m as calculated from the following:   Height as of this encounter: 5' (1.524 m).   Weight as of this encounter: 176 lb 3.2 oz (79.9 kg).  @WEIGHTCHANGE @  [employer]   03/01/23 0954  Weight: 176 lb 3.2 oz (79.9 kg)     Physical Exam    General: No distress. obese Neuro: Alert and Oriented x 3. GCS 15. Speech normal Psych: Pleasant Resp:  Barrel Chest - no.  Wheeze - no, Crackles - mild, No overt respiratory distress CVS: Normal heart sounds. Murmurs -  no Ext: Stigmata of Connective Tissue Disease - RA HEENT: [place]       Assessment:       ICD-10-CM   1. Interstitial lung disease due to connective tissue disease (HCC)  J84.89    M35.9     2. Rheumatoid arthritis of multiple sites with negative rheumatoid factor (HCC)  M06.09     3. Raynaud's disease without gangrene  I73.00          Plan:     Patient Instructions  Interstitial lung disease due to connective tissue disease (HCC) Drug allergy, multiple  Rheumatoid arthritis of multiple sites with negative rheumatoid factor (HCC) Raynaud's disease without gangrene  -You have very mild interstitial lung disease and this is due to rheumatoid arthritis   - [based on symptoms, walk test, pulmonary function test and CT scan] -Since 2013 there is very mild progression through 2022  - aug 2022 -> June 2023 is stable  - June 2023 -> May 2024 maybe slightly worse; not sure  - Big issue now is falls and pain  - Noted plan to get a new rheumatologist in [place]  Plan - support getting into balance class  - continue supportive care  - continue actemra and prednisone for RA -Do repeat pulmonary function test spirometry and DLCO in 3 months [can be done at [facility] in [place]] - avoid respiratory infections - mask up indoors and human clusters   Follow-up - 3 months do spirometry and DLCO on return to see Dr. Marchelle Gearing for follow-up in a 30-minute slot  -Symptom score and simple walking desaturation test at follow-up    SIGNATURE    Dr. Kalman Shan, M.D., F.C.C.P,  Pulmonary and Critical Care Medicine Staff Physician, [facility] Director - Interstitial Lung Disease  Program  Pulmonary Fibrosis [facility] at [facility] [place], [place], [address]  Pager: [phone], If no answer or between  15:00h - 7:00h: call [phone] Telephone: [phone]  10:36 AM 03/01/2023 d

## 2023-03-01 ENCOUNTER — Encounter: Payer: Self-pay | Admitting: Internal Medicine

## 2023-03-01 ENCOUNTER — Ambulatory Visit (INDEPENDENT_AMBULATORY_CARE_PROVIDER_SITE_OTHER): Payer: Medicare Other | Admitting: Internal Medicine

## 2023-03-01 ENCOUNTER — Ambulatory Visit: Payer: Medicare Other | Admitting: Internal Medicine

## 2023-03-01 VITALS — BP 120/90 | HR 86 | Ht 60.0 in | Wt 176.2 lb

## 2023-03-01 DIAGNOSIS — J8489 Other specified interstitial pulmonary diseases: Secondary | ICD-10-CM

## 2023-03-01 DIAGNOSIS — I7300 Raynaud's syndrome without gangrene: Secondary | ICD-10-CM

## 2023-03-01 DIAGNOSIS — M359 Systemic involvement of connective tissue, unspecified: Secondary | ICD-10-CM

## 2023-03-01 DIAGNOSIS — M0609 Rheumatoid arthritis without rheumatoid factor, multiple sites: Secondary | ICD-10-CM | POA: Diagnosis not present

## 2023-03-01 LAB — PULMONARY FUNCTION TEST
DL/VA % pred: 89 %
DL/VA: 3.77 ml/min/mmHg/L
DLCO cor % pred: 80 %
DLCO cor: 13.99 ml/min/mmHg
DLCO unc % pred: 75 %
DLCO unc: 13.1 ml/min/mmHg
FEF 25-75 Pre: 2.09 L/sec
FEF2575-%Pred-Pre: 131 %
FEV1-%Pred-Pre: 97 %
FEV1-Pre: 1.84 L
FEV1FVC-%Pred-Pre: 110 %
FEV6-%Pred-Pre: 92 %
FEV6-Pre: 2.22 L
FEV6FVC-%Pred-Pre: 105 %
FVC-%Pred-Pre: 87 %
FVC-Pre: 2.22 L
Pre FEV1/FVC ratio: 83 %
Pre FEV6/FVC Ratio: 100 %

## 2023-03-01 NOTE — Progress Notes (Signed)
Spiro and DLCO completed today  

## 2023-03-01 NOTE — Addendum Note (Signed)
Addended by: Hedda Slade on: 03/01/2023 10:41 AM   Modules accepted: Orders

## 2023-03-04 ENCOUNTER — Other Ambulatory Visit: Payer: Self-pay | Admitting: Family Medicine

## 2023-03-05 ENCOUNTER — Other Ambulatory Visit: Payer: Self-pay | Admitting: Family Medicine

## 2023-03-08 ENCOUNTER — Other Ambulatory Visit: Payer: Self-pay | Admitting: Rheumatology

## 2023-03-08 DIAGNOSIS — J849 Interstitial pulmonary disease, unspecified: Secondary | ICD-10-CM

## 2023-03-08 DIAGNOSIS — Z79899 Other long term (current) drug therapy: Secondary | ICD-10-CM

## 2023-03-08 DIAGNOSIS — M0609 Rheumatoid arthritis without rheumatoid factor, multiple sites: Secondary | ICD-10-CM

## 2023-03-10 NOTE — Progress Notes (Signed)
Office Visit Note  Patient: Tina Berger             Date of Birth: 1951/06/07           MRN: [id number]             PCP: Judy Pimple, MD Referring: Tower, [name], MD Visit Date: 03/22/2023 Occupation: @GUAROCC @  Subjective:  Discuss medications   History of Present Illness: Tina Berger is a 72 y.o. female with history of seronegative rheumatoid arthritis and ILD.  Patient remains on Actemra 162 mg subcutaneous injections every 14 days.  She continues to tolerate Actemra without any side effects or injection site reactions.  She has not had any recent or recurrent infections.  She remains on prednisone 10 mg daily.  Overall she has noticed a significant improvement in her symptoms on Actemra in combination with prednisone.  Partner has been feel that her rheumatoid arthritis has been significantly better controlled but unfortunately she did have a fall on 02/13/2023.  She has slowly been recovering from the injuries.  According to the patient she had a fracture in her left wrist and has been under the care of of Dr. Rosita Kea.  She has been using a brace as advised.  She has not yet scheduled an updated DEXA.  She is not currently taking anything for management of osteoporosis.  She has a history of a vertebral fracture remains on long-term prednisone as discussed above.   Activities of Daily Living:  Patient reports morning stiffness for 1 hour.   Patient Reports nocturnal pain.  Difficulty dressing/grooming: Denies Difficulty climbing stairs: Denies Difficulty getting out of chair: Denies Difficulty using hands for taps, buttons, cutlery, and/or writing: Reports  Review of Systems  Constitutional:  Positive for fatigue.  HENT:  Positive for mouth sores and mouth dryness.   Eyes:  Positive for dryness.  Respiratory:  Negative for shortness of breath.   Cardiovascular:  Negative for chest pain and palpitations.  Gastrointestinal:  Positive for constipation. Negative for blood in  stool and diarrhea.  Endocrine: Negative for increased urination.  Genitourinary:  Negative for involuntary urination.  Musculoskeletal:  Positive for joint pain, gait problem, joint pain, joint swelling, myalgias, muscle weakness, morning stiffness, muscle tenderness and myalgias.  Skin:  Positive for sensitivity to sunlight. Negative for color change, rash and hair loss.  Allergic/Immunologic: Negative for susceptible to infections.  Neurological:  Positive for headaches. Negative for dizziness.  Hematological:  Positive for swollen glands.  Psychiatric/Behavioral:  Positive for sleep disturbance. Negative for depressed mood. The patient is not nervous/anxious.     PMFS History:  Patient Active Problem List   Diagnosis Date Noted   Hematoma of flank 12/29/2022   Frequent falls 12/29/2022   Dysuria 09/07/2021   Rheumatoid arthritis (HCC) 05/27/2021   Chronic venous insufficiency 05/23/2021   AVM (arteriovenous malformation) 04/04/2021   CAD (coronary artery disease) 01/22/2021   Primary osteoarthritis of both knees 01/12/2021   Positive ANA (antinuclear antibody) 11/17/2020   Joint pain 11/13/2020   Current use of proton pump inhibitor 08/26/2020   Estrogen deficiency 08/21/2018   TMJ (dislocation of temporomandibular joint), initial encounter 05/05/2018   Anterior neck pain 05/05/2018   Chronic pain syndrome 11/09/2017   Chronic upper extremity pain [facility] Area of Pain) (Bilateral) (L>R) 11/09/2017   Fibromyalgia syndrome 11/09/2017   Osteoarthritis 11/09/2017   Osteoarthritis of lumbar facet joint (Bilateral) 11/09/2017   Lumbar facet arthropathy (Bilateral) 11/09/2017   Lumbar facet syndrome (  Bilateral) (L>R) 11/09/2017   Lumbar foraminal stenosis (multilevel) (Bilateral) 11/09/2017   DDD (degenerative disc disease), lumbar 11/09/2017   Thoracic levoscoliosis 11/09/2017   Thoracic facet syndrome (Bilateral) (L>R) 11/09/2017   Thoracic facet arthropathy (Bilateral) (R>L)  11/09/2017   DDD (degenerative disc disease), thoracic 11/09/2017   Thoracolumbar IVDD 11/09/2017   DDD (degenerative disc disease), cervical 11/09/2017   Osteoarthritis of cervical facet (Bilateral) 11/09/2017   Grade 1 Anterolisthesis of C7 over T1 11/09/2017   Cervical foraminal stenosis (Bilateral) 11/09/2017   History of fusion of cervical spine (C5-6 ACDF) 11/09/2017   Cervical facet syndrome (Bilateral) 11/09/2017   Disorder of skeletal system 11/09/2017   Cervical radiculitis (Bilateral) 11/09/2017   Lumbar Epidural lipomatosis 11/09/2017   Chronic sacroiliac joint pain (Bilateral) (L>R) 11/09/2017   Chronic hip pain (Bilateral) (L>R) 11/09/2017   Chronic upper back pain (Primary Area of Pain) (midline) 10/24/2017   Chronic neck pain (Secondary Area of Pain) (Bilateral)  (L>R) 10/24/2017   Chronic low back pain (Fourth Area of Pain) (Bilateral) (L>R) 10/24/2017   Chronic lower extremity pain (Fifth Area of Pain) (Bilateral) (L>R) 10/24/2017   Lumbar Grade 1 Retrolisthesis of L1-2 and L2-3 10/24/2017   Other long term (current) drug therapy 10/24/2017   Other specified health status 10/24/2017   Long term current use of opiate analgesic 10/24/2017   Long term prescription opiate use 10/24/2017   Opiate use 10/24/2017   DM type 2 (diabetes mellitus, type 2) (HCC) 06/14/2014   Pharmacologic therapy 06/14/2014   Pedal edema    Post-menopausal 06/23/2012   Lumbar disc disease with radiculopathy 11/18/2011   HTN (hypertension) 07/30/2011   Raynaud disease 07/30/2011   Obesity 07/30/2011   Routine general medical examination at a health care facility 03/28/2011   Palpitations 04/27/2010   Depression with anxiety 06/14/2008   Vitamin D deficiency 05/13/2008   ANXIETY 03/25/2008   Osteopenia 03/25/2008   Hypothyroidism 03/28/2007   Hyperlipidemia associated with type 2 diabetes mellitus (HCC) 03/28/2007   Other iron deficiency anemias 03/28/2007   PANIC ATTACK 03/28/2007    KERATOCONJUNCTIVITIS SICCA 03/28/2007   Mitral valve disorder 03/28/2007   ABNORMAL HEART RHYTHMS 03/28/2007   Raynaud's syndrome 03/28/2007   GERD 03/28/2007   IBS 03/28/2007   Rosacea 03/28/2007   PLANTAR FASCIITIS 03/28/2007   MIGRAINES, HX OF 03/28/2007    Past Medical History:  Diagnosis Date   Acute renal insufficiency 05/31/2014   pt not aware of this diagnosis   Anemia, iron deficiency    Anxiety    Bilateral lower extremity edema    a. uses torsemide   Broken wrist    did not require surgery   Cervical dysplasia    abnormal paps   Colon cancer screening 06/14/2014   Coronary artery disease    Cough 08/08/2012   Degenerative disk disease 11/19/2011   Depression    Diabetes mellitus type II    Diverticulosis    DJD (degenerative joint disease)    Drug rash 05/22/2011   Dry eyes    Dysrhythmia    metoprolol.   Edema    Elevated liver enzymes 03/21/2012   Encounter for routine gynecological examination 06/14/2014   ESOPHAGITIS 03/28/2007   Qualifier: Hospitalized for  By: Lindwood Qua CMA, Jerl Santos     Fibromyalgia    GASTRITIS 03/28/2007   Qualifier: History of  By: Lindwood Qua CMA, Jerl Santos     GERD (gastroesophageal reflux disease)    Hemorrhoids    external   HLD (hyperlipidemia)  HNP (herniated nucleus pulposus) 11/1997   T6,7,8 with DJD   HTN (hypertension)    Hyperglycemia 05/13/2008   Qualifier: Diagnosis of  By: Milinda Antis MD, Colon Flattery    Hypothyroidism    Interstitial lung disease (HCC)    pt not aware of this   Left ovarian cyst    x 3, rupture   Osteoarthritis    hands   Osteopenia    mild-11/01; improved 12/05   Other screening mammogram 08/18/2011   Palpitations    PERSONAL HISTORY ALLERGY UNSPEC MEDICINAL AGENT 03/18/2010   Qualifier: Diagnosis of  By: Milinda Antis MD, Colon Flattery    PONV (postoperative nausea and vomiting)    Raynaud's disease    Recurrent HSV (herpes simplex virus)    lesions in nose or mouth with frequent ulcers. d/t meds  and dry mouth   Rheumatoid arthritis (HCC)    Rhinitis 03/21/2012   Routine general medical examination at a health care facility 03/28/2011    Family History  Problem Relation Age of Onset   Lung cancer Mother        + smoker   Coronary artery disease Mother        relatively young   Emphysema Father        + smoker   Lymphoma Sister    Heart disease Brother    Lymphoma Brother    Parkinson's disease Brother    Anemia Brother        aplastic    Diabetes Brother    Iron deficiency Daughter    Leukemia Son    Breast cancer Neg Hx    Past Surgical History:  Procedure Laterality Date   ABDOMINAL EXPLORATION SURGERY     ACDF  1991   BIOPSY OF SKIN SUBCUTANEOUS TISSUE AND/OR MUCOUS MEMBRANE Left 04/08/2021   Procedure: BIOPSY OF SKIN SUBCUTANEOUS TISSUE AND/OR MUCOUS MEMBRANE ( REMOVAL SKIN NODULE);  Surgeon: Renford Dills, MD;  Location: [facility] ORS;  Service: Vascular;  Laterality: Left;   COLONOSCOPY  08/2000   Diverticulosis; hemorrhoids   HAND SURGERY Left    left thumb. 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   Lives with husband. Feels safe in her home.   Immunization History  Administered Date(s) Administered   Fluad Quad(high Dose 65+) 08/07/2020, 07/13/2022   Influenza Split 06/19/2011   Influenza Whole 08/01/2009   Influenza, High Dose Seasonal PF 08/14/2018, 07/03/2019   Influenza,inj,Quad PF,6+ Mos 06/14/2014, 08/25/2015, 07/26/2016, 08/19/2017   Moderna Sars-Covid-2 Vaccination 09/17/2020   PFIZER(Purple Top)SARS-COV-2 Vaccination 11/27/2019, 12/18/2019   Pneumococcal Conjugate-13 08/25/2015   Pneumococcal Polysaccharide-23 08/25/2016   Td 11/25/2003   Tdap 06/14/2014   Zoster, Live 09/16/2014     Objective: Vital Signs: BP (!) 143/86 (BP Location: Left Arm, Patient Position: Sitting, Cuff Size: Large)   Pulse 71    Resp 14   Ht 5' (1.524 m)   Wt 169 lb (76.7 kg)   BMI 33.01 kg/m    Physical Exam Vitals and nursing note reviewed.  Constitutional:      Appearance: She is well-developed.  HENT:     Head: Normocephalic and atraumatic.  Eyes:     Conjunctiva/sclera: Conjunctivae normal.  Cardiovascular:     Rate and Rhythm: Normal rate and regular rhythm.     Heart sounds: Normal heart sounds.  Pulmonary:     Effort: Pulmonary  effort is normal.     Breath sounds: Normal breath sounds.     Comments: Mild crackles at lung bases  Abdominal:     General: Bowel sounds are normal.     Palpations: Abdomen is soft.  Musculoskeletal:     Cervical back: Normal range of motion.  Skin:    General: Skin is warm and dry.     Capillary Refill: Capillary refill takes less than 2 seconds.  Neurological:     Mental Status: She is alert and oriented to person, place, and time.  Psychiatric:        Behavior: Behavior normal.      Musculoskeletal Exam: C-spine has limited range of motion with lateral rotation.  Postural thoracic kyphosis noted.  Limited mobility of the lumbar spine.  Shoulder joints have good range of motion with some stiffness bilaterally.  Elbow joints have good range of motion with no tenderness or inflammation.  Limited extension of both wrist joints.  Tenderness and synovitis in the right second and third PIPs and left third PIP joint.  Hip joints have good range of motion with no groin pain.  Knee joints have good range of motion with no warmth or effusion.  Ankle joints have good range of motion with no tenderness or joint swelling.  CDAI Exam: CDAI Score: 6.8  Patient Global: 4 mm; Provider Global: 4 mm Swollen: 3 ; Tender: 3  Joint Exam 03/22/2023      Right  Left  PIP 2  Swollen Tender     PIP 3  Swollen Tender  Swollen Tender     Investigation: No additional findings.  Imaging: No results found.  Recent Labs: Lab Results  Component Value Date   WBC 8.3 12/30/2022    HGB 11.5 (L) 12/30/2022   PLT 490 (H) 12/30/2022   NA 137 12/26/2022   K 4.8 12/26/2022   CL 101 12/26/2022   CO2 28 12/26/2022   GLUCOSE 133 (H) 12/26/2022   BUN 26 (H) 12/26/2022   CREATININE 0.75 12/26/2022   BILITOT 0.6 12/26/2022   ALKPHOS 44 12/26/2022   AST 18 12/26/2022   ALT 13 12/26/2022   PROT 6.8 12/26/2022   ALBUMIN 3.6 12/26/2022   CALCIUM 8.5 (L) 12/26/2022   GFRAA 75 03/24/2021   QFTBGOLDPLUS NEGATIVE 02/03/2022    Speciality Comments: Arava- mood disorder  Procedures:  No procedures performed Allergies: Ceftin [cefuroxime], Clonidine derivatives, Erythromycin, Keflex [cephalexin], Penicillins, Sulfonamide derivatives, Ciprofloxacin, Fluoxetine hcl, Furosemide, Gabapentin, Hydroxychloroquine, Paroxetine, Pregabalin, Tetracycline, Venlafaxine, Etodolac, Macrobid [nitrofurantoin], Amitriptyline hcl, Atorvastatin, Benicar [olmesartan medoxomil], Cephalexin, Cetirizine hcl, Diovan [valsartan], and Naproxen sodium     Assessment / Plan:     Visit Diagnoses: Rheumatoid arthritis of multiple sites with negative rheumatoid factor (HCC) - She has ongoing tenderness and inflammation in the right second and third PIPs and the left third PIP joint.  She remains on Actemra 162 mg subcutaneous injections every 14 days.  She has not had any recent gaps in therapy.  She has been tolerating Actemra without any side effects, injection site reactions, or recurrent infections.  She has been taking prednisone 10 mg daily as combination therapy.  She is aware of the risks of long-term prednisone use.  Overall while remaining on long-term prednisone in combination with Actemra she has noticed a significant improvement in her quality of life and inflammation secondary to rheumatoid arthritis.  She had a recent fall on 02/13/2023 which she has been recovering from at which time she fractured her left  wrist.  Plan to update DEXA ASAP and will be initiating Forteo pending insurance approval.  She  has a history of vertebral fracture, osteopenia, long-term prednisone use, and frequent falls so she will be a good candidate Forteo if approved.   She will remain on Actemra as prescribed for management of rheumatoid arthritis.  She was advised to notify us if she develops any signs or symptoms of a flare.  She will follow-up in the office in 5 months or sooner if needed.  Plan: Tocilizumab (ACTEMRA ACTPEN) 162 MG/0.9ML SOAJ  ILD (interstitial lung disease) (HCC) -Evaluated by Dr. Marchelle Gearing on 03/01/2023: Very mild ILD: based on symptoms, walk test, pulmonary function test and CT scan.  Recommended continuing Actemra and prednisone as prescribed along with supportive care.  Planning to update PFTs and DLCO in 3 months. Plan: Tocilizumab (ACTEMRA ACTPEN) 162 MG/0.9ML SOAJ  High risk medication use - Actemra 162 mg subcutaneous injections every 14 days.  Prednisone 10 mg daily.  Discussed the risks of long-term prednisone use. CBC drawn on 12/30/2022.  CMP drawn on 12/26/2022.  Orders for CBC and CMP were released today.  Her next lab work will be due in September and every 3 months to monitor for drug toxicity. TB Gold negative on 02/03/2022.  TB Gold ordered released today. No recent or recurrent infections.  Discussed the importance of holding Actemra if she develops signs or symptoms of infection and to resume once the infection is completely cleared. Previous therapy includes: Plaquenil, Imuran, and a Arava. Discontinued Arava due to mood changes.  Not a good candidate for methotrexate due to underlying ILD.  Not a good candidate for sulfasalazine due to sulfa allergy. - Plan: CBC with Differential/Platelet, COMPLETE METABOLIC PANEL WITH GFR, QuantiFERON-TB Gold Plus, Tocilizumab (ACTEMRA ACTPEN) 162 MG/0.9ML SOAJ  Screening for tuberculosis - TB gold order released today. Plan: QuantiFERON-TB Gold Plus  Raynaud's disease without gangrene: She continues to have intermittent symptoms of Raynaud's  phenomenon especially in her toes.  No digital ulcerations noted today.  Chronic left shoulder pain: Stiffness with ROM.   Primary osteoarthritis of both knees: Good range of motion of both knee joints on examination today.  No warmth or effusion noted.  She has had less difficulty rising from a seated position.  Patient plans on working on her balance and lower extremity strengthening.  She does not feel the need to use a walker at this time to assist with ambulation.  DDD (degenerative disc disease), cervical: C-spine has limited range of motion without rotation.  History of fusion of cervical spine (C5-6 ACDF): Limited ROM.   DDD (degenerative disc disease), thoracic: Thoracic kyphosis noted.  DDD (degenerative disc disease), lumbar: Chronic pain and limited mobility.  Fibromyalgia syndrome -She has generalized hyperalgesia and positive tender points on examination.  She remains on Cymbalta 60 mg daily.  She has been able to gradually increase her activity level and mobility.  Chronic pain syndrome: Patient is taking hydromorphone and oxycodone for pain relief.  Under the care of pain management.  Long term current use of opiate analgesic - Followed by pain management-taking hydromorphone twice daily and oxycodone for breakthrough pain.  Osteopenia of multiple sites - DEXA 11/01/18: The BMD measured at Femur Total Left is 0.822 g/cm2 with a T-score of -1.5. Ordered by Dr. Milinda Antis. Patient has not yet had an updated bone density.   Given her history of a vertebral fracture, recent left wrist fracture, frequent falls, and chronic use of prednisone she will require  treatment for management of osteoporosis.  Different treatment options were discussed today in detail.  Indications, contraindications, and potential side effects of Forteo were discussed today.  Plan on applying for Forteo through her insurance and once approved she plans on returning to the office for the first injection.  Plan on  obtaining the following lab work today prior to initiating Forteo.  - Plan: COMPLETE METABOLIC PANEL WITH GFR, VITAMIN D 25 Hydroxy (Vit-D Deficiency, Fractures), Parathyroid hormone, intact (no Ca), TSH, Phosphorus, Serum protein electrophoresis with reflex  History of vertebral fracture: No kyphoplasty performed. Applying for forteo daily injections.   History of wrist fracture: Left-fall 02/13/23. Applying for forteo.   Vitamin D deficiency -Vitamin D level be rechecked today.  Plan: VITAMIN D 25 Hydroxy (Vit-D Deficiency, Fractures)  Other medical conditions are listed as follows:  Chronic renal impairment, stage 3a (HCC): Creatinine was 0.75 and GFR was greater than 60 on 12/26/2022.  CMP with GFR updated today.  Type 2 diabetes mellitus without complication, without long-term current use of insulin (HCC)  Depression with anxiety  History of IBS  Hyperlipidemia associated with type 2 diabetes mellitus (HCC)  History of hypothyroidism - TSH updated today.  Plan: TSH  Primary hypertension: Blood pressure is 143/86 today in the office.  Her blood pressure was rechecked prior to leaving.  Patient was advised to monitor blood pressure closely.  Mitral valve disorder  Hx of arteriovenous malformation (AVM)  History of gastroesophageal reflux (GERD)  Venous ulcer of left leg (HCC)      Orders: Orders Placed This Encounter  Procedures   CBC with Differential/Platelet   COMPLETE METABOLIC PANEL WITH GFR   VITAMIN D 25 Hydroxy (Vit-D Deficiency, Fractures)   QuantiFERON-TB Gold Plus   Parathyroid hormone, intact (no Ca)   TSH   Phosphorus   Serum protein electrophoresis with reflex   Meds ordered this encounter  Medications   Tocilizumab (ACTEMRA ACTPEN) 162 MG/0.9ML SOAJ    Sig: Inject 162 mg into the skin every 14 (fourteen) days.    Dispense:  5.4 mL    Refill:  0   Follow-Up Instructions: Return in about 5 months (around 08/22/2023) for Rheumatoid arthritis,  ILD.   Gearldine Bienenstock, PA-C  Note - This record has been created using Dragon software.  Chart creation errors have been sought, but may not always  have been located. Such creation errors do not reflect on  the standard of medical care.

## 2023-03-15 ENCOUNTER — Other Ambulatory Visit: Payer: Self-pay | Admitting: Family Medicine

## 2023-03-15 ENCOUNTER — Telehealth: Payer: Self-pay

## 2023-03-15 NOTE — Telephone Encounter (Signed)
-----   Message from Garry Heater [place], [facility] sent at 02/22/2023  4:48 PM EDT ----- Regarding: call pt Check how she is doing with metformin XR

## 2023-03-15 NOTE — Progress Notes (Signed)
Care Management & Coordination Services Pharmacy Team  Reason for Encounter: Medication change tolerance    Unsuccessful outreach. Unable to leave voicemail.  Patient recently changed Metformin to XR- 500mg  - 2 tablets BID   Al Corpus, PharmD notified  Burt Knack, [facility] Clinical Pharmacy Assistant [phone]

## 2023-03-16 ENCOUNTER — Other Ambulatory Visit: Payer: Self-pay | Admitting: Family Medicine

## 2023-03-16 DIAGNOSIS — E039 Hypothyroidism, unspecified: Secondary | ICD-10-CM

## 2023-03-16 NOTE — Telephone Encounter (Signed)
I sent a 30 d supply and put the lab order in  Please schedule thanks

## 2023-03-16 NOTE — Telephone Encounter (Signed)
TSH was abnormal in 06/2022, but no f/u labs have been done and pt doesn't have an appt for labs or with PCP scheduled

## 2023-03-17 ENCOUNTER — Other Ambulatory Visit: Payer: Self-pay | Admitting: Family Medicine

## 2023-03-17 DIAGNOSIS — E119 Type 2 diabetes mellitus without complications: Secondary | ICD-10-CM

## 2023-03-17 NOTE — Telephone Encounter (Signed)
LVMTCB and sched, sent mychart message 

## 2023-03-22 ENCOUNTER — Telehealth: Payer: Self-pay | Admitting: Pharmacist

## 2023-03-22 ENCOUNTER — Ambulatory Visit: Payer: Medicare Other | Attending: Physician Assistant | Admitting: Physician Assistant

## 2023-03-22 ENCOUNTER — Encounter: Payer: Self-pay | Admitting: Physician Assistant

## 2023-03-22 VITALS — BP 143/86 | HR 71 | Resp 14 | Ht 60.0 in | Wt 169.0 lb

## 2023-03-22 DIAGNOSIS — M25512 Pain in left shoulder: Secondary | ICD-10-CM

## 2023-03-22 DIAGNOSIS — Z111 Encounter for screening for respiratory tuberculosis: Secondary | ICD-10-CM

## 2023-03-22 DIAGNOSIS — Z79891 Long term (current) use of opiate analgesic: Secondary | ICD-10-CM

## 2023-03-22 DIAGNOSIS — M8589 Other specified disorders of bone density and structure, multiple sites: Secondary | ICD-10-CM

## 2023-03-22 DIAGNOSIS — I83029 Varicose veins of left lower extremity with ulcer of unspecified site: Secondary | ICD-10-CM

## 2023-03-22 DIAGNOSIS — G894 Chronic pain syndrome: Secondary | ICD-10-CM | POA: Diagnosis not present

## 2023-03-22 DIAGNOSIS — Z79899 Other long term (current) drug therapy: Secondary | ICD-10-CM

## 2023-03-22 DIAGNOSIS — M797 Fibromyalgia: Secondary | ICD-10-CM

## 2023-03-22 DIAGNOSIS — M5030 Other cervical disc degeneration, unspecified cervical region: Secondary | ICD-10-CM

## 2023-03-22 DIAGNOSIS — M0609 Rheumatoid arthritis without rheumatoid factor, multiple sites: Secondary | ICD-10-CM | POA: Diagnosis not present

## 2023-03-22 DIAGNOSIS — I059 Rheumatic mitral valve disease, unspecified: Secondary | ICD-10-CM

## 2023-03-22 DIAGNOSIS — I7300 Raynaud's syndrome without gangrene: Secondary | ICD-10-CM

## 2023-03-22 DIAGNOSIS — Z8774 Personal history of (corrected) congenital malformations of heart and circulatory system: Secondary | ICD-10-CM

## 2023-03-22 DIAGNOSIS — Z981 Arthrodesis status: Secondary | ICD-10-CM | POA: Diagnosis not present

## 2023-03-22 DIAGNOSIS — J849 Interstitial pulmonary disease, unspecified: Secondary | ICD-10-CM

## 2023-03-22 DIAGNOSIS — I10 Essential (primary) hypertension: Secondary | ICD-10-CM

## 2023-03-22 DIAGNOSIS — M51369 Other intervertebral disc degeneration, lumbar region without mention of lumbar back pain or lower extremity pain: Secondary | ICD-10-CM

## 2023-03-22 DIAGNOSIS — M5136 Other intervertebral disc degeneration, lumbar region: Secondary | ICD-10-CM | POA: Diagnosis not present

## 2023-03-22 DIAGNOSIS — L97929 Non-pressure chronic ulcer of unspecified part of left lower leg with unspecified severity: Secondary | ICD-10-CM

## 2023-03-22 DIAGNOSIS — E559 Vitamin D deficiency, unspecified: Secondary | ICD-10-CM

## 2023-03-22 DIAGNOSIS — Z8781 Personal history of (healed) traumatic fracture: Secondary | ICD-10-CM

## 2023-03-22 DIAGNOSIS — E785 Hyperlipidemia, unspecified: Secondary | ICD-10-CM

## 2023-03-22 DIAGNOSIS — E119 Type 2 diabetes mellitus without complications: Secondary | ICD-10-CM

## 2023-03-22 DIAGNOSIS — M5134 Other intervertebral disc degeneration, thoracic region: Secondary | ICD-10-CM | POA: Diagnosis not present

## 2023-03-22 DIAGNOSIS — E1169 Type 2 diabetes mellitus with other specified complication: Secondary | ICD-10-CM

## 2023-03-22 DIAGNOSIS — Z8639 Personal history of other endocrine, nutritional and metabolic disease: Secondary | ICD-10-CM

## 2023-03-22 DIAGNOSIS — G8929 Other chronic pain: Secondary | ICD-10-CM

## 2023-03-22 DIAGNOSIS — M170 Bilateral primary osteoarthritis of knee: Secondary | ICD-10-CM | POA: Diagnosis not present

## 2023-03-22 DIAGNOSIS — Z8719 Personal history of other diseases of the digestive system: Secondary | ICD-10-CM

## 2023-03-22 DIAGNOSIS — F418 Other specified anxiety disorders: Secondary | ICD-10-CM

## 2023-03-22 DIAGNOSIS — N1831 Chronic kidney disease, stage 3a: Secondary | ICD-10-CM

## 2023-03-22 LAB — CBC WITH DIFFERENTIAL/PLATELET
Eosinophils Absolute: 40 cells/uL (ref 15–500)
Eosinophils Relative: 0.6 %
MCHC: 32.5 g/dL (ref 32.0–36.0)
MCV: 90 fL (ref 80.0–100.0)
RBC: 4.68 10*6/uL (ref 3.80–5.10)

## 2023-03-22 MED ORDER — ACTEMRA ACTPEN 162 MG/0.9ML ~~LOC~~ SOAJ: 162.0000 mg | SUBCUTANEOUS | 0 refills | Status: DC

## 2023-03-22 NOTE — Patient Instructions (Addendum)
Please call to schedule bone density scan:  [facility] [address], [address] [facility] [place],  [place]  [address] Get Driving Directions Main: [phone]   Teriparatide Injection What is this medication? TERIPARATIDE (terr ih PAR a tyd) treats osteoporosis. It works by Interior and spatial designer stronger and less likely to break (fracture). This medicine may be used for other purposes; ask your health care provider or pharmacist if you have questions. COMMON BRAND NAME(S): FORTEO What should I tell my care team before I take this medication? They need to know if you have any of these conditions: Bone disease other than osteoporosis High levels of calcium in the blood History of cancer in the bone Kidney stone Paget's disease Parathyroid disease Receiving radiation therapy An unusual or allergic reaction to teriparatide, other medications, foods, dyes, or preservatives Pregnant or trying to get pregnant Breast-feeding How should I use this medication? This medication is injected under the skin. You will be taught how to prepare and give it. Take it as directed on the prescription label at the same time every day. Keep taking it unless your care team tells you to stop. This medication comes with INSTRUCTIONS FOR USE. Ask your pharmacist for directions on how to use this medication. Read the information carefully. Talk to your pharmacist or care team if you have questions. It is important that you put your used needles and pens in a special sharps container. Do not put them in a trash can. If you do not have a sharps container, call your pharmacist or care team to get one. A special MedGuide will be given to you by the pharmacist with each prescription and refill. Be sure to read this information carefully each time. Talk to your care team about the use of this medication in children. Special care may be needed. Overdosage: If  you think you have taken too much of this medicine contact a poison control center or emergency room at once. NOTE: This medicine is only for you. Do not share this medicine with others. What if I miss a dose? If you miss a dose, take it as soon as you can. If it is almost time for your next dose, take only that dose. Do not take double or extra doses. What may interact with this medication? Digoxin This list may not describe all possible interactions. Give your health care provider a list of all the medicines, herbs, non-prescription drugs, or dietary supplements you use. Also tell them if you smoke, drink alcohol, or use illegal drugs. Some items may interact with your medicine. What should I watch for while using this medication? Visit your care team for regular checks on your progress. You may need blood work while you are taking this medication. You should make sure you get enough calcium and vitamin D while you are taking this medication. Discuss the foods you eat and the vitamins you take with your care team. This medication may affect your coordination, reaction time, or judgment. Do not drive or operate machinery until you know how this medication affects you. Sit up or stand slowly to reduce the risk of dizzy or fainting spells. Drinking alcohol with this medication can increase the risk of these side effects. Talk to your care team about your risk of cancer. You may be more at risk for certain types of cancers if you take this medication. What side effects may I notice from receiving this medication? Side effects  that you should report to your care team as soon as possible: Allergic reactions--skin rash, itching, hives, swelling of the face, lips, tongue, or throat High calcium level--increased thirst or amount of urine, nausea, vomiting, confusion, unusual weakness or fatigue, bone pain Kidney stones--blood in the urine, pain or trouble passing urine, pain in the lower back or sides Low  blood pressure--dizziness, feeling faint or lightheaded, blurry vision Side effects that usually do not require medical attention (report these to your care team if they continue or are bothersome): Dizziness Headache Joint pain Nausea Pain, redness, or irritation at injection site This list may not describe all possible side effects. Call your doctor for medical advice about side effects. You may report side effects to FDA at 1-800-FDA-1088. Where should I keep my medication? Keep out of the reach of children and pets. Store the pens in the refrigerator. Do not freeze. Use the pen quickly after taking out of the refrigerator and recap and return to refrigerator right after using. Protect from light. Get rid of any unused medication 28 days after the first injection from the pen. Get rid of any unopened, unused medication after the expiration date on the label. To get rid of medications that are no longer needed or have expired: Take the medication to a medication take-back program. Check with your pharmacy or law enforcement to find a location. If you cannot return the medication, ask your pharmacist or care team how to get rid of this medication safely. NOTE: This sheet is a summary. It may not cover all possible information. If you have questions about this medicine, talk to your doctor, pharmacist, or health care provider.  2024 Elsevier/Gold Standard (2022-01-13 00:00:00)

## 2023-03-22 NOTE — Telephone Encounter (Addendum)
Submitted a Prior Authorization request to [facility] for FORTEO via CoverMyMeds. Will update once we receive a response.  Key: [id number]  Chesley Mires, PharmD, MPH, BCPS, CPP Clinical Pharmacist (Rheumatology and Pulmonology)   ----- Message from Ellen Henri, CMA sent at 03/22/2023  4:14 PM EDT ----- Please apply for Forteo, per Sherron Ales, PA-C. Patient would like to have first injection in the office. Thanks!

## 2023-03-23 LAB — CBC WITH DIFFERENTIAL/PLATELET
Absolute Monocytes: 509 cells/uL (ref 200–950)
Hemoglobin: 13.7 g/dL (ref 11.7–15.5)
Lymphs Abs: 1548 cells/uL (ref 850–3900)
Platelets: 302 10*3/uL (ref 140–400)

## 2023-03-23 LAB — COMPLETE METABOLIC PANEL WITH GFR
AG Ratio: 1.8 (calc) (ref 1.0–2.5)
AST: 13 U/L (ref 10–35)
Albumin: 4.6 g/dL (ref 3.6–5.1)
CO2: 31 mmol/L (ref 20–32)
Calcium: 9.5 mg/dL (ref 8.6–10.4)
eGFR: 67 mL/min/{1.73_m2} (ref >=60)

## 2023-03-23 LAB — PARATHYROID HORMONE, INTACT (NO CA): PTH: 48 pg/mL (ref 16–77)

## 2023-03-24 LAB — COMPLETE METABOLIC PANEL WITH GFR
BUN/Creatinine Ratio: 30 (calc) — ABNORMAL HIGH (ref 6–22)
BUN: 27 mg/dL — ABNORMAL HIGH (ref 7–25)
Chloride: 98 mmol/L (ref 98–110)
Creat: 0.91 mg/dL (ref 0.60–1.00)

## 2023-03-24 LAB — CBC WITH DIFFERENTIAL/PLATELET
Basophils Absolute: 27 cells/uL (ref 0–200)
Basophils Relative: 0.4 %
Neutro Abs: 4576 cells/uL (ref 1500–7800)
Total Lymphocyte: 23.1 %

## 2023-03-24 LAB — QUANTIFERON-TB GOLD PLUS
NIL: 0.02 IU/mL
QuantiFERON-TB Gold Plus: NEGATIVE
TB1-NIL: 0 IU/mL

## 2023-03-24 LAB — VITAMIN D 25 HYDROXY (VIT D DEFICIENCY, FRACTURES): Vit D, 25-Hydroxy: 50 ng/mL (ref 30–100)

## 2023-03-24 LAB — PROTEIN ELECTROPHORESIS, SERUM, WITH REFLEX: Total Protein: 7.3 g/dL (ref 6.1–8.1)

## 2023-03-24 LAB — TSH: TSH: 0.6 mIU/L (ref 0.40–4.50)

## 2023-03-25 ENCOUNTER — Other Ambulatory Visit (HOSPITAL_COMMUNITY): Payer: Self-pay

## 2023-03-25 ENCOUNTER — Other Ambulatory Visit (INDEPENDENT_AMBULATORY_CARE_PROVIDER_SITE_OTHER): Payer: Medicare Other

## 2023-03-25 DIAGNOSIS — E039 Hypothyroidism, unspecified: Secondary | ICD-10-CM | POA: Diagnosis not present

## 2023-03-25 LAB — PROTEIN ELECTROPHORESIS, SERUM, WITH REFLEX
Albumin ELP: 4.4 g/dL (ref 3.8–4.8)
Alpha 1: 0.3 g/dL (ref 0.2–0.3)
Alpha 2: 0.8 g/dL (ref 0.5–0.9)
Beta 2: 0.4 g/dL (ref 0.2–0.5)
Beta Globulin: 0.5 g/dL (ref 0.4–0.6)
Gamma Globulin: 1 g/dL (ref 0.8–1.7)

## 2023-03-25 LAB — CBC WITH DIFFERENTIAL/PLATELET
HCT: 42.1 % (ref 35.0–45.0)
MCH: 29.3 pg (ref 27.0–33.0)
MPV: 9.9 fL (ref 7.5–12.5)
Monocytes Relative: 7.6 %
Neutrophils Relative %: 68.3 %
RDW: 12.1 % (ref 11.0–15.0)
WBC: 6.7 10*3/uL (ref 3.8–10.8)

## 2023-03-25 LAB — COMPLETE METABOLIC PANEL WITH GFR
ALT: 10 U/L (ref 6–29)
Alkaline phosphatase (APISO): 43 U/L (ref 37–153)
Globulin: 2.6 g/dL (calc) (ref 1.9–3.7)
Glucose, Bld: 120 mg/dL — ABNORMAL HIGH (ref 65–99)
Potassium: 5.3 mmol/L (ref 3.5–5.3)
Sodium: 138 mmol/L (ref 135–146)
Total Bilirubin: 0.3 mg/dL (ref 0.2–1.2)
Total Protein: 7.2 g/dL (ref 6.1–8.1)

## 2023-03-25 LAB — TSH: TSH: 1.86 u[IU]/mL (ref 0.35–5.50)

## 2023-03-25 LAB — PHOSPHORUS: Phosphorus: 5.3 mg/dL — ABNORMAL HIGH (ref 2.1–4.3)

## 2023-03-25 LAB — QUANTIFERON-TB GOLD PLUS
Mitogen-NIL: 7.92 IU/mL
TB2-NIL: 0 IU/mL

## 2023-03-25 NOTE — Telephone Encounter (Signed)
Received notification from [facility] regarding a prior authorization for FORTEO. Authorization has been APPROVED from 03/22/23 to 10/18/23. Approval letter sent to scan center.  Per test claim, copay for 28 days supply is $1374.70  Patient can fill through [facility]: [phone]   Authorization # [id number] Phone # [phone]  Will need to apply for Forteo's pt assistance application through [employer]. Application printed.  Chesley Mires, PharmD, MPH, BCPS, CPP Clinical Pharmacist (Rheumatology and Pulmonology)

## 2023-03-28 ENCOUNTER — Other Ambulatory Visit: Payer: Self-pay | Admitting: *Deleted

## 2023-03-28 DIAGNOSIS — N1831 Chronic kidney disease, stage 3a: Secondary | ICD-10-CM

## 2023-03-28 DIAGNOSIS — M0609 Rheumatoid arthritis without rheumatoid factor, multiple sites: Secondary | ICD-10-CM

## 2023-03-28 DIAGNOSIS — Z79899 Other long term (current) drug therapy: Secondary | ICD-10-CM

## 2023-03-28 DIAGNOSIS — J849 Interstitial pulmonary disease, unspecified: Secondary | ICD-10-CM

## 2023-03-28 DIAGNOSIS — E119 Type 2 diabetes mellitus without complications: Secondary | ICD-10-CM

## 2023-03-28 DIAGNOSIS — E1169 Type 2 diabetes mellitus with other specified complication: Secondary | ICD-10-CM

## 2023-03-28 DIAGNOSIS — I7300 Raynaud's syndrome without gangrene: Secondary | ICD-10-CM

## 2023-03-28 MED ORDER — LEVOTHYROXINE SODIUM 125 MCG PO TABS: 125.0000 ug | ORAL_TABLET | Freq: Every day | ORAL | 2 refills | Status: DC

## 2023-03-28 NOTE — Telephone Encounter (Signed)
ATC patient to discuss Forteo PAP application, unable to leave voicemail as VM box is full. MyChart message sent.

## 2023-03-28 NOTE — Progress Notes (Signed)
CBC WNL .CMP stable.  Vitamin D WNL  TSH WNL PTH WNL SPEP did not reveal any abnormal protein bands.  Phosphorus is elevated-5.3.  renal function is WNL.   Please advise patient to have phosphorus rechecked in 2 weeks.    TB gold negative.

## 2023-03-30 NOTE — Telephone Encounter (Addendum)
ATC patient, phone went to voicemail and VM box is full.  Patient portion of application mailed to her. Another MyChart message sent to patient notifying her of this.

## 2023-04-01 ENCOUNTER — Other Ambulatory Visit: Payer: Self-pay | Admitting: Family Medicine

## 2023-04-04 DIAGNOSIS — M170 Bilateral primary osteoarthritis of knee: Secondary | ICD-10-CM

## 2023-04-04 DIAGNOSIS — M5134 Other intervertebral disc degeneration, thoracic region: Secondary | ICD-10-CM

## 2023-04-04 DIAGNOSIS — M5136 Other intervertebral disc degeneration, lumbar region: Secondary | ICD-10-CM

## 2023-04-04 DIAGNOSIS — M0609 Rheumatoid arthritis without rheumatoid factor, multiple sites: Secondary | ICD-10-CM

## 2023-04-04 DIAGNOSIS — G8929 Other chronic pain: Secondary | ICD-10-CM

## 2023-04-04 DIAGNOSIS — M797 Fibromyalgia: Secondary | ICD-10-CM

## 2023-04-04 DIAGNOSIS — G894 Chronic pain syndrome: Secondary | ICD-10-CM

## 2023-04-04 DIAGNOSIS — M51369 Other intervertebral disc degeneration, lumbar region without mention of lumbar back pain or lower extremity pain: Secondary | ICD-10-CM

## 2023-04-04 DIAGNOSIS — M5030 Other cervical disc degeneration, unspecified cervical region: Secondary | ICD-10-CM

## 2023-04-04 NOTE — Telephone Encounter (Signed)
Ok place referral to PT.    Treatment options for osteoporosis can further be discussed pending DEXA results.

## 2023-04-13 NOTE — Telephone Encounter (Signed)
Provider forms for PAP scanned into Onbase to be ready for submission once pt forms received. MyChart message sent to pt today to confirm receipt of docs.  Chesley Mires, PharmD, MPH, BCPS, CPP Clinical Pharmacist (Rheumatology and Pulmonology)

## 2023-04-14 ENCOUNTER — Other Ambulatory Visit: Payer: Self-pay | Admitting: Physician Assistant

## 2023-04-14 DIAGNOSIS — G894 Chronic pain syndrome: Secondary | ICD-10-CM | POA: Diagnosis not present

## 2023-04-14 DIAGNOSIS — M4725 Other spondylosis with radiculopathy, thoracolumbar region: Secondary | ICD-10-CM | POA: Diagnosis not present

## 2023-04-14 DIAGNOSIS — K5903 Drug induced constipation: Secondary | ICD-10-CM | POA: Diagnosis not present

## 2023-04-14 DIAGNOSIS — M5137 Other intervertebral disc degeneration, lumbosacral region: Secondary | ICD-10-CM | POA: Diagnosis not present

## 2023-04-14 DIAGNOSIS — Z79891 Long term (current) use of opiate analgesic: Secondary | ICD-10-CM | POA: Diagnosis not present

## 2023-04-14 DIAGNOSIS — M5134 Other intervertebral disc degeneration, thoracic region: Secondary | ICD-10-CM | POA: Diagnosis not present

## 2023-04-14 DIAGNOSIS — M961 Postlaminectomy syndrome, not elsewhere classified: Secondary | ICD-10-CM | POA: Diagnosis not present

## 2023-04-14 NOTE — Telephone Encounter (Signed)
Patient will not qualify for patient assistance for Forteo based on household income. She also does not prefer a daily injection. She would like to discuss other treatment options.  She does have history of GERD so would not be best candidate for oral bisphosphonates. Due to CAD, Evenity would also not be recommended but she does not have absolute contraindication to this.  Reclast may be a treatment option. Renal function wnl  Chesley Mires, PharmD, MPH, BCPS, CPP Clinical Pharmacist (Rheumatology and Pulmonology)

## 2023-04-14 NOTE — Telephone Encounter (Signed)
Last Fill: 11/30/2022  Next Visit: 08/30/2023  Last Visit: 03/28/2023  DX: Rheumatoid arthritis of multiple sites with negative rheumatoid factor   Current Dose per office note on 03/28/2023: Prednisone 10 mg daily.   Okay to refill prednisone?

## 2023-04-16 IMAGING — CT CT HEAD W/O CM
4 series · 16 of 47 positions shown, 18 images · non-contrast
Comparison: None.

CLINICAL DATA: Headache.



[Series 4: head wo · axial · 0.46mm/px · z∈[-143,-18]mm · 7 of 35 slices shown, 9 images]
[im 5/35  brain]
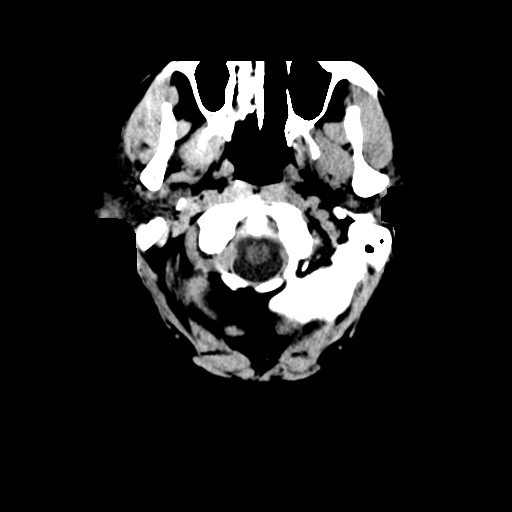
[im 5/35  bone]
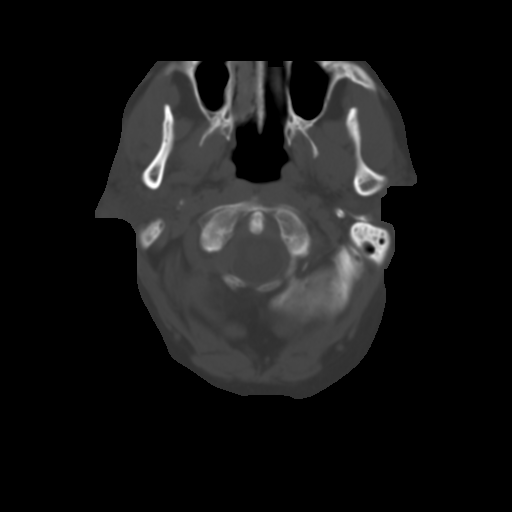
[im 9/35  brain]
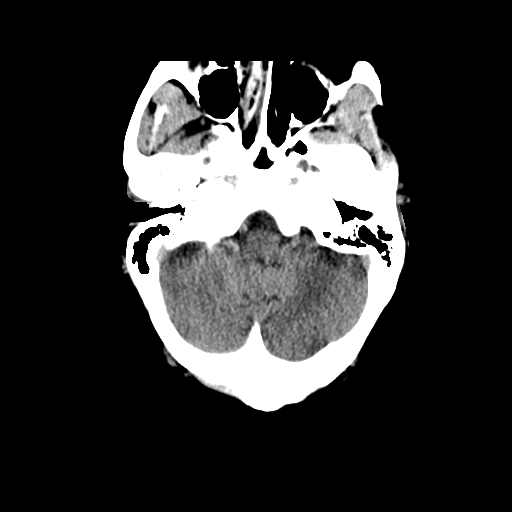
[im 13/35  brain]
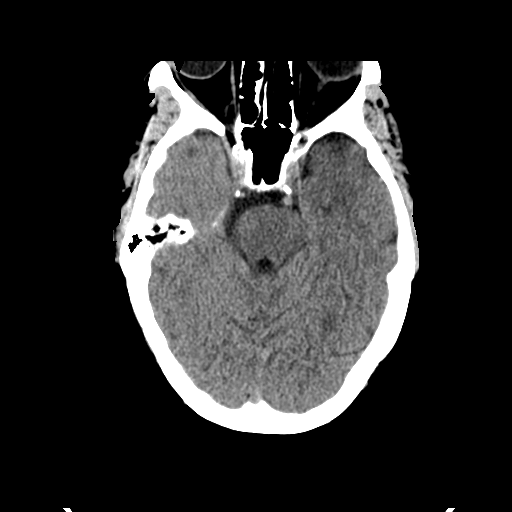
[im 18/35  brain]
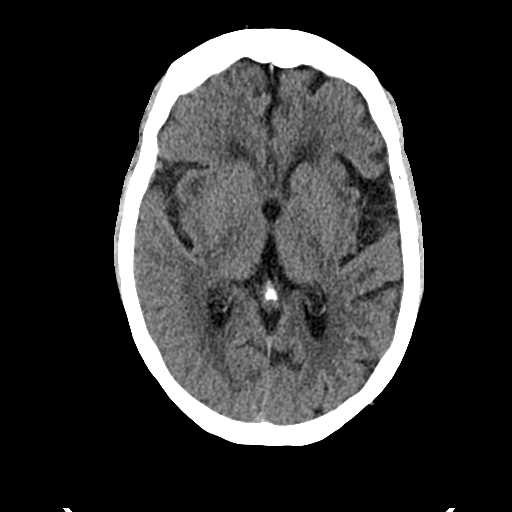
[im 22/35  brain]
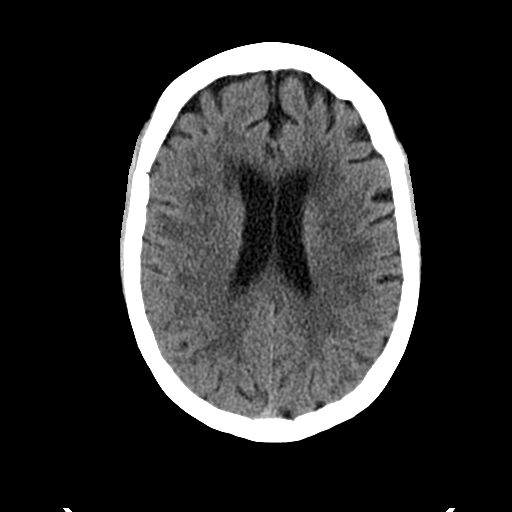
[im 22/35  bone]
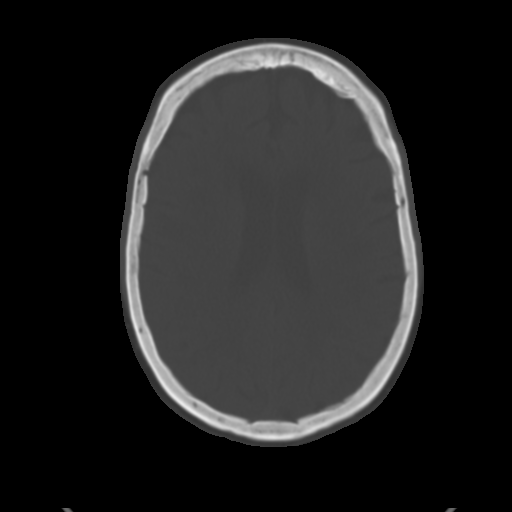
[im 26/35  brain]
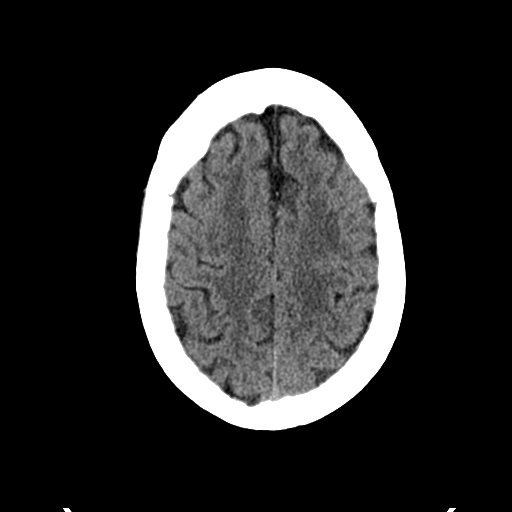
[im 30/35  brain]
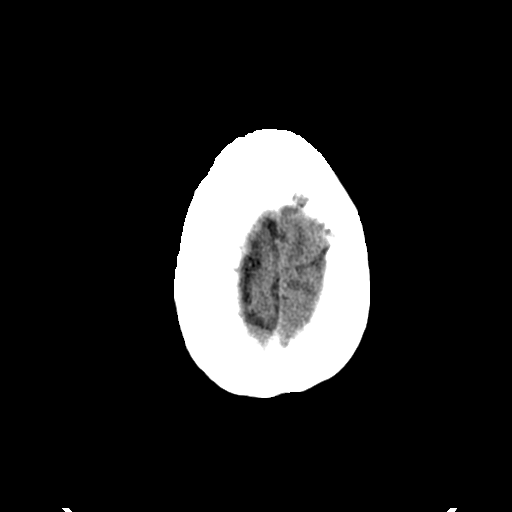

[Series 5: head bone · axial · 0.46mm/px · z∈[-147,-113]mm · 3 of 86 slices shown]
[im 9/86  bone]
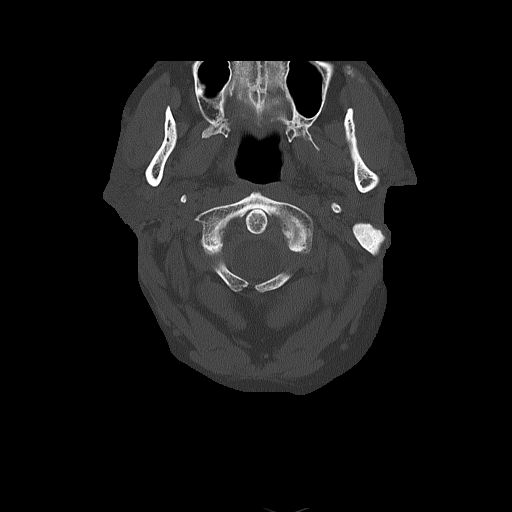
[im 18/86  bone]
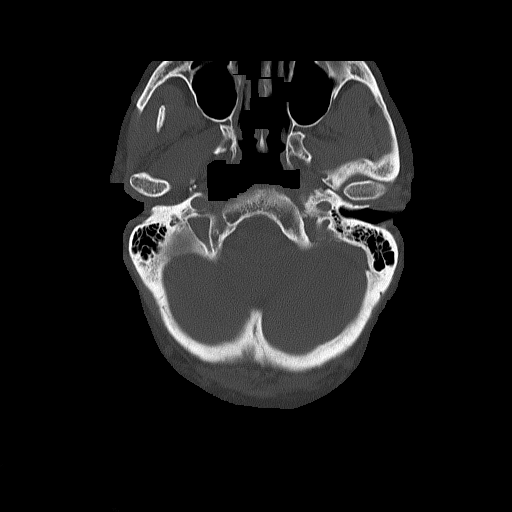
[im 26/86  bone]
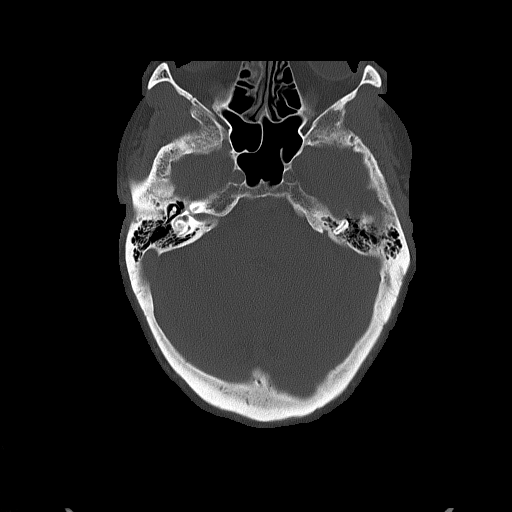

[Series 6: coronal soft tissue · coronal · 0.35mm/px · 3 of 72 slices shown]
[im 24/72  brain]
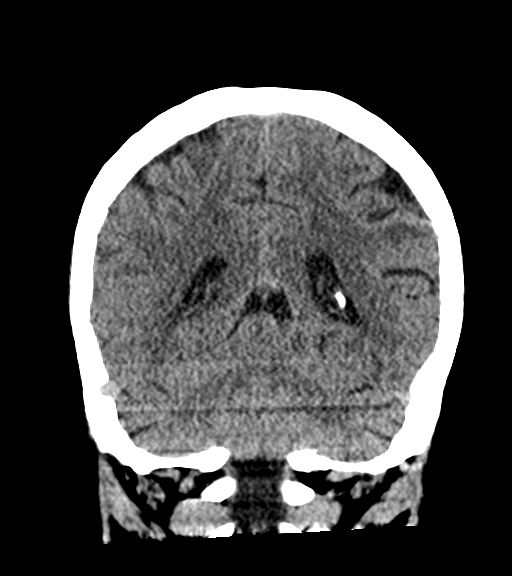
[im 32/72  brain]
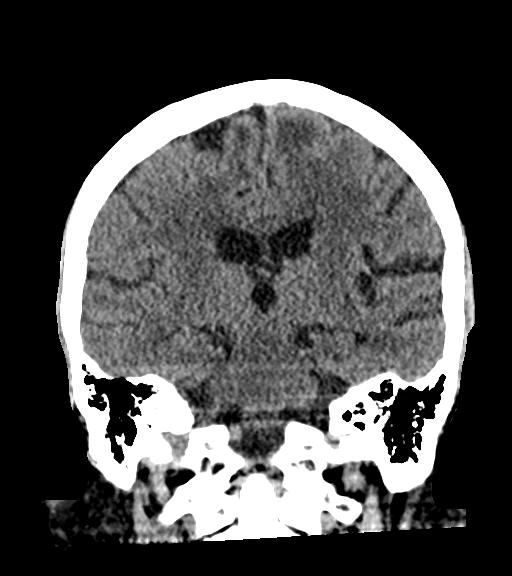
[im 40/72  brain]
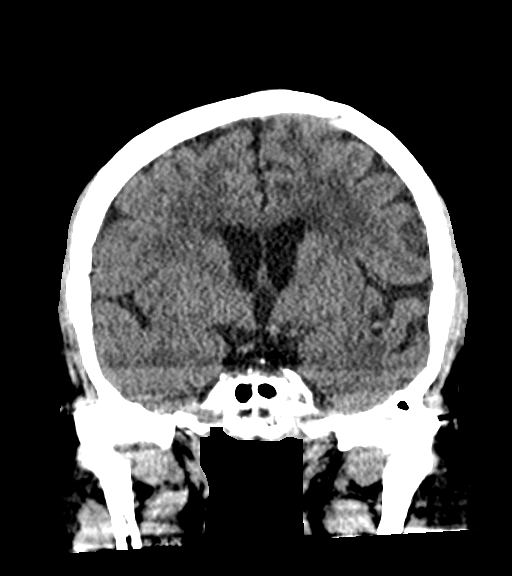

[Series 7: sagittal soft tissue · sagittal · 0.38mm/px · 3 of 59 slices shown]
[im 20/59  brain]
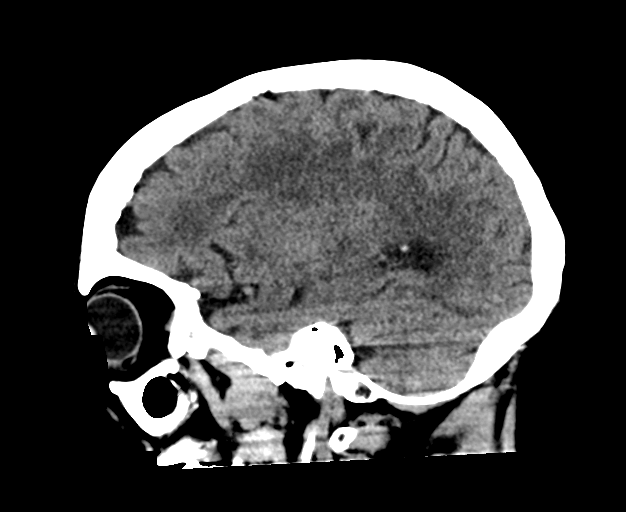
[im 30/59  brain]
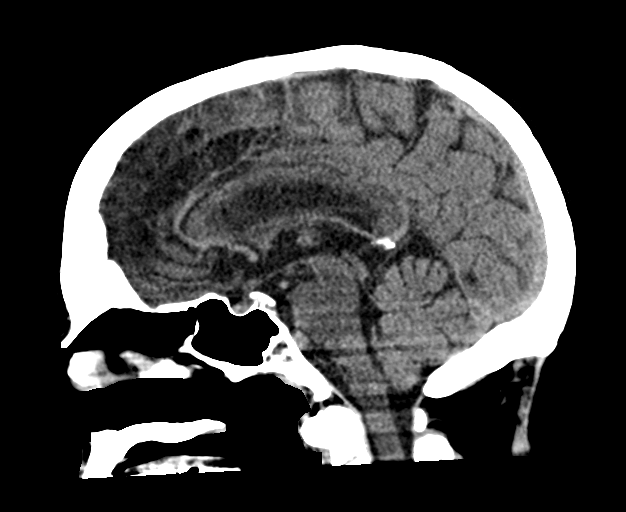
[im 39/59  brain]
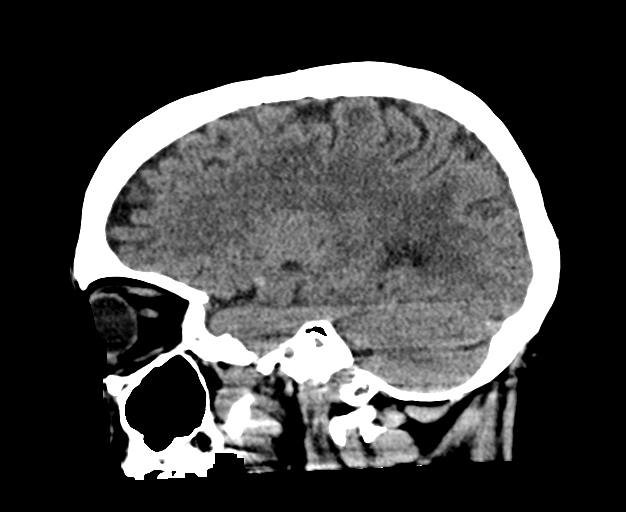

[16 of 47 positions shown; findings below may reference images not displayed]

FINDINGS: Brain: No evidence of acute infarction, hemorrhage, hydrocephalus,
extra-axial collection or mass lesion/mass effect. There is mild
diffuse low-attenuation within the subcortical and periventricular
white matter compatible with chronic microvascular disease.
Prominence of the sulci identified compatible with brain atrophy.

Vascular: No hyperdense vessel or unexpected calcification.

Skull: Prominent venous lakes identified within the left frontal and
parietal calvarium near the convexity.

Sinuses/Orbits: No acute finding.

Other: None
IMPRESSION: 1. No acute intracranial abnormalities.
2. Chronic small vessel ischemic disease and brain atrophy.

## 2023-04-19 ENCOUNTER — Ambulatory Visit
Admission: RE | Admit: 2023-04-19 | Discharge: 2023-04-19 | Disposition: A | Discharge status: Home / Self Care | Payer: Medicare Other | Source: Ambulatory Visit | Attending: Family Medicine | Admitting: Family Medicine

## 2023-04-19 DIAGNOSIS — E2839 Other primary ovarian failure: Secondary | ICD-10-CM | POA: Insufficient documentation

## 2023-04-19 DIAGNOSIS — M85851 Other specified disorders of bone density and structure, right thigh: Secondary | ICD-10-CM | POA: Diagnosis not present

## 2023-04-20 ENCOUNTER — Encounter: Payer: Self-pay | Admitting: Family Medicine

## 2023-04-26 ENCOUNTER — Telehealth: Payer: Self-pay | Admitting: Internal Medicine

## 2023-04-26 ENCOUNTER — Other Ambulatory Visit: Payer: Self-pay | Admitting: Internal Medicine

## 2023-04-26 DIAGNOSIS — M359 Systemic involvement of connective tissue, unspecified: Secondary | ICD-10-CM

## 2023-04-26 NOTE — Telephone Encounter (Signed)
Tina Berger will schedule PFT once [facility] August schedule is released.  Nothing further needed at this time.

## 2023-04-26 NOTE — Telephone Encounter (Signed)
PT sched 30 min FU w/Dr. Elvera Lennox Last AVS notes say to sched a PFT at [facility]. I see the "Active Request" in the system but I think it s for a PFT here so I wanted to be sure the PT got sched at the hospital. TY.

## 2023-04-28 ENCOUNTER — Other Ambulatory Visit: Payer: Self-pay | Admitting: Family Medicine

## 2023-05-02 ENCOUNTER — Telehealth: Payer: Self-pay | Admitting: Physical Therapy

## 2023-05-02 NOTE — Telephone Encounter (Signed)
Called to move pt up in schedule. She has already made schedule for the week and she cannot change her appointment.

## 2023-05-04 ENCOUNTER — Ambulatory Visit: Payer: Medicare Other | Admitting: Physical Therapy

## 2023-05-10 ENCOUNTER — Encounter: Payer: Medicare Other | Admitting: Physical Therapy

## 2023-05-10 ENCOUNTER — Other Ambulatory Visit: Payer: Self-pay | Admitting: Family Medicine

## 2023-05-10 ENCOUNTER — Ambulatory Visit (INDEPENDENT_AMBULATORY_CARE_PROVIDER_SITE_OTHER): Payer: Medicare Other

## 2023-05-10 VITALS — Wt 169.0 lb

## 2023-05-10 DIAGNOSIS — Z0000 Encounter for general adult medical examination without abnormal findings: Secondary | ICD-10-CM

## 2023-05-10 NOTE — Progress Notes (Signed)
Subjective:   Tina Berger is a 72 y.o. female who presents for Medicare Annual (Subsequent) preventive examination.   Per patient no change in vitals since last visit; unable to obtain new vitals due to this being a telehealth visit. 05/10/23 Patient was unable to self-report vital signs via telehealth due to a lack of equipment at home.   Visit Complete: Virtual  I connected with  Tina Berger on 05/10/23 by a audio enabled telemedicine application and verified that I am speaking with the correct person using two identifiers.  Patient Location: Home  Provider Location: Office/Clinic  I discussed the limitations of evaluation and management by telemedicine. The patient expressed understanding and agreed to proceed.  Review of Systems     Cardiac Risk Factors include: advanced age (>62men, >43 women);dyslipidemia;hypertension;diabetes mellitus;obesity (BMI >30kg/m2)     Objective:    Today's Vitals   05/10/23 1557  Weight: 169 lb (76.7 kg)   Body mass index is 33.01 kg/m.     05/10/2023    4:14 PM 02/14/2023    8:48 PM 05/05/2022    3:11 PM 04/08/2021    3:40 PM 04/06/2021    4:07 PM 03/29/2021    7:05 PM 08/20/2020    3:31 PM  Advanced Directives  Does Patient Have a Medical Advance Directive? Yes No Yes Yes Yes No Yes  Type of Estate agent of [place];Living will  Healthcare Power of [place];Living will  Living will;Healthcare Power of [employer] Power of [place];Living will  Does patient want to make changes to medical advance directive? No - Patient declined  Yes (Inpatient - patient defers changing a medical advance directive and declines information at this time)  No - Patient declined    Copy of Healthcare Power of Attorney in Chart? Yes - validated most recent copy scanned in chart (See row information)  Yes - validated most recent copy scanned in chart (See row information) No - copy requested Yes - validated most recent copy  scanned in chart (See row information)  Yes - validated most recent copy scanned in chart (See row information)  Would patient like information on creating a medical advance directive?   No - Patient declined  No - Patient declined No - Patient declined     Current Medications (verified) Outpatient Encounter Medications as of 05/10/2023  Medication Sig   Acetaminophen (TYLENOL ARTHRITIS PAIN PO) Take 650 mg by mouth daily.   buPROPion (WELLBUTRIN XL) 150 MG 24 hr tablet TAKE 1 TABLET BY MOUTH DAILY   cholecalciferol (VITAMIN D3) 25 MCG (1000 UNIT) tablet Take 1,000 Units by mouth daily.   cyclobenzaprine (FLEXERIL) 5 MG tablet Take 5 mg by mouth daily as needed.   DULoxetine (CYMBALTA) 60 MG capsule Take 1 capsule (60 mg total) by mouth daily.   esomeprazole (NEXIUM) 20 MG capsule TAKE 1 CAPSULE BY MOUTH DAILY   famotidine (PEPCID) 40 MG tablet TAKE 1 TABLET BY MOUTH DAILY   ferrous sulfate 325 (65 FE) MG EC tablet Take 325 mg by mouth daily with breakfast.   glucose blood test strip One Touch Ultra stripts blue-To check sugar once daily and as needed for DM2 250.00   HYDROmorphone HCl (EXALGO) 12 MG T24A SR tablet Take 12 mg by mouth 2 (two) times daily.   hyoscyamine (LEVSIN SL) 0.125 MG SL tablet TAKE 1 TABLET BY MOUTH EVERY 4 HOURS AS NEEDED FOR CRAMPING.   levothyroxine (SYNTHROID) 125 MCG tablet Take 1 tablet (125 mcg  total) by mouth daily before breakfast.   LYSINE PO Take by mouth as needed. OTC for ulcer prophylaxis   Magnesium 400 MG CAPS Take by mouth daily.   metFORMIN (GLUCOPHAGE-XR) 500 MG 24 hr tablet TAKE 2 TABLETS (1,000 MG TOTAL) BY MOUTH 2 (TWO) TIMES DAILY WITH A MEAL.   metoprolol succinate (TOPROL-XL) 50 MG 24 hr tablet TAKE 1 TABLET BY MOUTH TWICE  DAILY   ondansetron (ZOFRAN) 4 MG tablet Take 1 tablet (4 mg total) by mouth every 8 (eight) hours as needed for nausea or vomiting.   oxyCODONE (OXYCONTIN) 10 MG 12 hr tablet Take 10 mg by mouth 3 (three) times daily as  needed. Beakthrough pain   predniSONE (DELTASONE) 10 MG tablet Take 1 tablet (10 mg total) by mouth daily with breakfast.   ramipril (ALTACE) 5 MG capsule TAKE 1 CAPSULE BY MOUTH DAILY   rosuvastatin (CRESTOR) 5 MG tablet TAKE 1 TABLET BY MOUTH TWICE  WEEKLY   Tocilizumab (ACTEMRA ACTPEN) 162 MG/0.9ML SOAJ Inject 162 mg into the skin every 14 (fourteen) days.   Xylitol (XYLIMELTS MT) Use as directed in the mouth or throat.   Chlorphen-Phenyleph-ASA (ALKA-SELTZER PLUS COLD PO) Take by mouth as needed. Just for colds (Patient not taking: Reported on 05/10/2023)   Diclofenac Sodium 1.5 % SOLN Place 2 mLs onto the skin 4 (four) times daily. (Patient not taking: Reported on 05/10/2023)   GUAIFENESIN CR PO Take by mouth as needed. (Patient not taking: Reported on 05/10/2023)   ketoconazole (NIZORAL) 2 % shampoo SHAMPOO WITH A SMALL AMOUNT AS DIRECTED THREE TIMES A WEEK SHAMPOO SCALP 3 DAYS A WEEK, LET SHAMPOO SIT FOR 10 MINUTES AND RINSE OFF (Patient not taking: Reported on 05/10/2023)   naloxone (NARCAN) nasal spray 4 mg/0.1 mL SMARTSIG:Both Nares (Patient not taking: Reported on 03/22/2023)   predniSONE (DELTASONE) 5 MG tablet TAKE 2 TABLETS BY MOUTH EVERY DAY (Patient not taking: Reported on 05/10/2023)   tiZANidine (ZANAFLEX) 4 MG tablet Take 4 mg by mouth every 8 (eight) hours as needed for muscle spasms. (Patient not taking: Reported on 03/22/2023)   [DISCONTINUED] predniSONE (DELTASONE) 1 MG tablet TAKE 2 TABLETS BY MOUTH ONCE WITH BREAKFAST , ALONG WITH 5MG  TO EQUAL 7MG  DAILY (Patient not taking: Reported on 03/22/2023)   No facility-administered encounter medications on file as of 05/10/2023.    Allergies (verified) Ceftin [cefuroxime], Clonidine derivatives, Erythromycin, Keflex [cephalexin], Penicillins, Sulfonamide derivatives, Ciprofloxacin, Fluoxetine hcl, Furosemide, Gabapentin, Hydroxychloroquine, Paroxetine, Pregabalin, Tetracycline, Venlafaxine, Etodolac, Macrobid [nitrofurantoin], Amitriptyline  hcl, Atorvastatin, Benicar [olmesartan medoxomil], Cephalexin, Cetirizine hcl, Diovan [valsartan], and Naproxen sodium   History: Past Medical History:  Diagnosis Date   Acute renal insufficiency 05/31/2014   pt not aware of this diagnosis   Anemia, iron deficiency    Anxiety    Bilateral lower extremity edema    a. uses torsemide   Broken wrist    did not require surgery   Cervical dysplasia    abnormal paps   Colon cancer screening 06/14/2014   Coronary artery disease    Cough 08/08/2012   Degenerative disk disease 11/19/2011   Depression    Diabetes mellitus type II    Diverticulosis    DJD (degenerative joint disease)    Drug rash 05/22/2011   Dry eyes    Dysrhythmia    metoprolol.   Edema    Elevated liver enzymes 03/21/2012   Encounter for routine gynecological examination 06/14/2014   ESOPHAGITIS 03/28/2007   Qualifier: Hospitalized for  By: Lindwood Qua CMA,  Jerl Santos     Fibromyalgia    GASTRITIS 03/28/2007   Qualifier: History of  By: Lindwood Qua CMA, Jerl Santos     GERD (gastroesophageal reflux disease)    Hemorrhoids    external   HLD (hyperlipidemia)    HNP (herniated nucleus pulposus) 11/1997   T6,7,8 with DJD   HTN (hypertension)    Hyperglycemia 05/13/2008   Qualifier: Diagnosis of  By: Milinda Antis MD, Colon Flattery    Hypothyroidism    Interstitial lung disease (HCC)    pt not aware of this   Left ovarian cyst    x 3, rupture   Osteoarthritis    hands   Osteopenia    mild-11/01; improved 12/05   Other screening mammogram 08/18/2011   Palpitations    PERSONAL HISTORY ALLERGY UNSPEC MEDICINAL AGENT 03/18/2010   Qualifier: Diagnosis of  By: Milinda Antis MD, Colon Flattery    PONV (postoperative nausea and vomiting)    Raynaud's disease    Recurrent HSV (herpes simplex virus)    lesions in nose or mouth with frequent ulcers. d/t meds and dry mouth   Rheumatoid arthritis (HCC)    Rhinitis 03/21/2012   Routine general medical examination at a health care facility  03/28/2011   Past Surgical History:  Procedure Laterality Date   ABDOMINAL EXPLORATION SURGERY     ACDF  1991   BIOPSY OF SKIN SUBCUTANEOUS TISSUE AND/OR MUCOUS MEMBRANE Left 04/08/2021   Procedure: BIOPSY OF SKIN SUBCUTANEOUS TISSUE AND/OR MUCOUS MEMBRANE ( REMOVAL SKIN NODULE);  Surgeon: Renford Dills, MD;  Location: [facility] ORS;  Service: Vascular;  Laterality: Left;   COLONOSCOPY  08/2000   Diverticulosis; hemorrhoids   HAND SURGERY Left    left thumb. PIN REMOVED   LASIK     bilateral   Family History  Problem Relation Age of Onset   Lung cancer Mother        + smoker   Coronary artery disease Mother        relatively young   Emphysema Father        + smoker   Lymphoma Sister    Heart disease Brother    Lymphoma Brother    Parkinson's disease Brother    Anemia Brother        aplastic    Diabetes Brother    Iron deficiency Daughter    Leukemia Son    Breast cancer Neg Hx    Social History   Socioeconomic History   Marital status: Married    Spouse name: Ronnie   Number of children: 1   Years of education: Not on file   Highest education level: Not on file  Occupational History   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 [employer]    Employer: [employer]    Comment: retired  Tobacco Use   Smoking status: Former    Current packs/day: 0.00    Average packs/day: 1 pack/day for 5.0 years (5.0 ttl pk-yrs)    Types: Cigarettes    Start date: 10/19/1963    Quit date: 10/18/1968    Years since quitting: 54.5    Passive exposure: Never   Smokeless tobacco: Never   Tobacco comments:    quit over 40 years  Vaping Use   Vaping status: Never Used  Substance and Sexual Activity   Alcohol use: No    Alcohol/week: 0.0 standard drinks of alcohol   Drug use: No   Sexual activity: Yes  Other Topics Concern   Not on file  Social  History Narrative   Married1 childSecretary to dean at [employer] regularly exerciseDaily caffeine use: 2/day.   Lives with husband. Feels safe in her  home.   Social Determinants of Health   Financial Resource Strain: Low Risk  (05/10/2023)   Overall Financial Resource Strain (CARDIA)    Difficulty of Paying Living Expenses: Not hard at all  Food Insecurity: No Food Insecurity (05/10/2023)   Hunger Vital Sign    Worried About Running Out of Food in the Last Year: Never true    Ran Out of Food in the Last Year: Never true  Transportation Needs: No Transportation Needs (05/10/2023)   PRAPARE - Administrator, Civil Service (Medical): No    Lack of Transportation (Non-Medical): No  Physical Activity: Insufficiently Active (05/10/2023)   Exercise Vital Sign    Days of Exercise per Week: 3 days    Minutes of Exercise per Session: 20 min  Stress: No Stress Concern Present (05/10/2023)   [employer] of Occupational Health - Occupational Stress Questionnaire    Feeling of Stress : Only a little  Social Connections: Moderately Isolated (05/10/2023)   Social Connection and Isolation Panel [NHANES]    Frequency of Communication with Friends and Family: More than three times a week    Frequency of Social Gatherings with Friends and Family: Twice a week    Attends Religious Services: Never    Database administrator or Organizations: No    Attends Engineer, structural: Never    Marital Status: Married    Tobacco Counseling Counseling given: Not Answered Tobacco comments: quit over 40 years   Clinical Intake:  Pre-visit preparation completed: Yes  Pain : No/denies pain     BMI - recorded: 33.01 Nutritional Status: BMI > 30  Obese Nutritional Risks: None Diabetes: Yes CBG done?: No Did pt. bring in CBG monitor from home?: No  How often do you need to have someone help you when you read instructions, pamphlets, or other written materials from your doctor or pharmacy?: 1 - Never  Interpreter Needed?: No  Information entered by :: Lanier Ensign, LPN   Activities of Daily Living    05/10/2023    4:00 PM   In your present state of health, do you have any difficulty performing the following activities:  Hearing? 0  Vision? 0  Difficulty concentrating or making decisions? 0  Walking or climbing stairs? 0  Dressing or bathing? 1  Comment husband assist  Doing errands, shopping? 0  Preparing Food and eating ? Y  Comment husband assist  Using the Toilet? N  In the past six months, have you accidently leaked urine? N  Do you have problems with loss of bowel control? N  Managing your Medications? N  Managing your Finances? N  Housekeeping or managing your Housekeeping? N    Patient Care Team: [place], [name], MD as PCP - General Mariah Milling, Tollie Pizza, MD as Consulting Physician (Cardiology) Kathyrn Sheriff, [facility] (Inactive) as Pharmacist (Pharmacist)  Indicate any recent Medical Services you may have received from other than [facility] providers in the past year (date may be approximate).     Assessment:   This is a routine wellness examination for Cloie.  Hearing/Vision screen Hearing Screening - Comments:: Pt denies any hearing issues  Vision Screening - Comments:: Pt follows up with [facility]   Dietary issues and exercise activities discussed:     Goals Addressed  This Visit's Progress    Patient Stated       Get stronger with therapy        Depression Screen    05/10/2023    4:09 PM 12/29/2022    3:03 PM 07/13/2022    2:06 PM 05/05/2022    3:10 PM 03/23/2022   11:25 AM 08/20/2020    3:33 PM 08/16/2019   10:31 AM  PHQ 2/9 Scores  PHQ - 2 Score 0 0 0 0 0 0 0  PHQ- 9 Score  3 9 0  0 0    Fall Risk    05/10/2023    4:15 PM 12/29/2022    3:03 PM 05/05/2022    3:13 PM 05/05/2022    1:12 PM 03/23/2022   11:24 AM  Fall Risk   Falls in the past year? 1 1 1 1 1   Number falls in past yr: 1 1 0 0 0  Injury with Fall? 1 1 0 0 1  Comment left hand    hit head and shoulder  Risk for fall due to : Impaired vision;Impaired balance/gait;History of fall(s) History of  fall(s) No Fall Risks;History of fall(s)  Impaired mobility  Follow up Falls prevention discussed Falls evaluation completed Falls evaluation completed;Falls prevention discussed  Falls evaluation completed    MEDICARE RISK AT HOME:  Medicare Risk at Home - 05/10/23 1615     Any stairs in or around the home? Yes    If so, are there any without handrails? No    Home free of loose throw rugs in walkways, pet beds, electrical cords, etc? Yes    Adequate lighting in your home to reduce risk of falls? Yes    Life alert? No    Use of a cane, walker or w/c? No    Grab bars in the bathroom? Yes    Shower chair or bench in shower? No    Elevated toilet seat or a handicapped toilet? Yes             TIMED UP AND GO:  Was the test performed?  No    Cognitive Function:    08/20/2020    3:36 PM 08/16/2019   10:33 AM 08/14/2018   10:56 AM 08/10/2017   11:35 AM 07/26/2016    3:40 PM  MMSE - Mini Mental State Exam  Orientation to time 5 5 5 5    Orientation to Place 5 5 5 5    Registration 3 3 3 3    Attention/ Calculation 5 5 0 0   Recall 3 3 3 3  --  Recall-comments     pt was unable to recall 2 of 3 words  Language- name 2 objects   0 0   Language- repeat 1 1 1 1    Language- follow 3 step command   3 3   Language- read & follow direction   0 0   Write a sentence   0 0   Copy design   0 0   Total score   20 20         05/05/2022    3:16 PM  6CIT Screen  What Year? 0 points  What month? 0 points  What time? 0 points  Count back from 20 0 points  Months in reverse 0 points  Repeat phrase 0 points  Total Score 0 points    Immunizations Immunization History  Administered Date(s) Administered   Fluad Quad(high Dose 65+) 08/07/2020, 07/13/2022   Influenza Split 06/19/2011  Influenza Whole 08/01/2009   Influenza, High Dose Seasonal PF 08/14/2018, 07/03/2019   Influenza,inj,Quad PF,6+ Mos 06/14/2014, 08/25/2015, 07/26/2016, 08/19/2017   Moderna Sars-Covid-2 Vaccination  09/17/2020   PFIZER(Purple Top)SARS-COV-2 Vaccination 11/27/2019, 12/18/2019   Pneumococcal Conjugate-13 08/25/2015   Pneumococcal Polysaccharide-23 08/25/2016   Td 11/25/2003   Tdap 06/14/2014   Zoster, Live 09/16/2014    TDAP status: Up to date  Flu Vaccine status: Up to date  Pneumococcal vaccine status: Up to date  Covid-19 vaccine status: Completed vaccines  Qualifies for Shingles Vaccine? Yes   Zostavax completed Yes   Shingrix Completed?: No.    Education has been provided regarding the importance of this vaccine. Patient has been advised to call insurance company to determine out of pocket expense if they have not yet received this vaccine. Advised may also receive vaccine at local pharmacy or Health Dept. Verbalized acceptance and understanding.  Screening Tests Health Maintenance  Topic Date Due   Zoster Vaccines- Shingrix (1 of 2) 02/06/1970   Fecal DNA (Cologuard)  09/04/2019   COVID-19 Vaccine (4 - 2023-24 season) 06/18/2022   HEMOGLOBIN A1C  01/04/2023   OPHTHALMOLOGY EXAM  03/05/2023   FOOT EXAM  03/24/2023   INFLUENZA VACCINE  05/19/2023   Diabetic kidney evaluation - Urine ACR  07/07/2023   MAMMOGRAM  07/10/2023   Diabetic kidney evaluation - eGFR measurement  03/21/2024   Medicare Annual Wellness (AWV)  05/09/2024   DTaP/Tdap/Td (3 - Td or Tdap) 06/14/2024   Pneumonia Vaccine 34+ Years old  Completed   DEXA SCAN  Completed   Hepatitis C Screening  Completed   HPV VACCINES  Aged Out   Colonoscopy  Discontinued    Health Maintenance  Health Maintenance Due  Topic Date Due   Zoster Vaccines- Shingrix (1 of 2) 02[birth year]06[birth year]1969   Fecal DNA (Cologuard)  09/04/2019   COVID-19 Vaccine (4 - 2023-24 season) 06/18/2022   HEMOGLOBIN A1C  01/04/2023   OPHTHALMOLOGY EXAM  03/05/2023   FOOT EXAM  03/24/2023    Colorectal cancer screening: No longer required. Per HM   Mammogram status: Completed 07/09/22. Repeat every year  Bone Density status: Completed 04/19/23.  Results reflect: Bone density results: OSTEOPENIA. Repeat every 2 years.   Additional Screening:  Hepatitis C Screening:  Completed 01/13/21  Vision Screening: Recommended annual ophthalmology exams for early detection of glaucoma and other disorders of the eye. Is the patient up to date with their annual eye exam?  No  Who is the provider or what is the name of the office in which the patient attends annual eye exams? [facility]  If pt is not established with a provider, would they like to be referred to a provider to establish care? No .   Dental Screening: Recommended annual dental exams for proper oral hygiene  Diabetic Foot Exam: Diabetic Foot Exam: Overdue, Pt has been advised about the importance in completing this exam. Pt is scheduled for diabetic foot exam on next appt .  Community Resource Referral / Chronic Care Management: CRR required this visit?  No   CCM required this visit?  No     Plan:     I have personally reviewed and noted the following in the patient's chart:   Medical and social history Use of alcohol, tobacco or illicit drugs  Current medications and supplements including opioid prescriptions. Patient is currently taking opioid prescriptions. Information provided to patient regarding non-opioid alternatives. Patient advised to discuss non-opioid treatment plan with their provider. Functional ability and status  Nutritional status Physical activity Advanced directives List of other physicians Hospitalizations, surgeries, and ER visits in previous 12 months Vitals Screenings to include cognitive, depression, and falls Referrals and appointments  In addition, I have reviewed and discussed with patient certain preventive protocols, quality metrics, and best practice recommendations. A written personalized care plan for preventive services as well as general preventive health recommendations were provided to patient.     Marzella Schlein,  LPN   [id number]   After Visit Summary: (MyChart) Due to this being a telephonic visit, the after visit summary with patients personalized plan was offered to patient via MyChart   Nurse Notes: none

## 2023-05-10 NOTE — Patient Instructions (Signed)
[name] , Thank you for taking time to come for your Medicare Wellness Visit. I appreciate your ongoing commitment to your health goals. Please review the following plan we discussed and let me know if I can assist you in the future.   These are the goals we discussed:  Goals      DIET - EAT MORE FRUITS AND VEGETABLES     Increase physical activity     Starting 08/14/2018, I will continue to exercise at least 30 minutes 3-4 days per week.      Patient Stated     08/16/2019, I will continue to exercise and improve my strength training.      Patient Stated     08/20/2020, I will continue pilates 2 days a week for 30 minutes and walk 4 days a week for 1 hour.         This is a list of the screening recommended for you and due dates:  Health Maintenance  Topic Date Due   Zoster (Shingles) Vaccine (1 of 2) 02/06/1970   Cologuard (Stool DNA test)  09/04/2019   COVID-19 Vaccine (4 - 2023-24 season) 06/18/2022   Hemoglobin A1C  01/04/2023   Eye exam for diabetics  03/05/2023   Complete foot exam   03/24/2023   Flu Shot  05/19/2023   Yearly kidney health urinalysis for diabetes  07/07/2023   Mammogram  07/10/2023   Yearly kidney function blood test for diabetes  03/21/2024   Medicare Annual Wellness Visit  05/09/2024   DTaP/Tdap/Td vaccine (3 - Td or Tdap) 06/14/2024   Pneumonia Vaccine  Completed   DEXA scan (bone density measurement)  Completed   Hepatitis C Screening  Completed   HPV Vaccine  Aged Out   Colon Cancer Screening  Discontinued    Advanced directives: copies in chart   Conditions/risks identified: get stronger with therapy   Next appointment: Follow up in one year for your annual wellness visit    Preventive Care 65 Years and Older, Female Preventive care refers to lifestyle choices and visits with your health care provider that can promote health and wellness. What does preventive care include? A yearly physical exam. This is also called an annual well  check. Dental exams once or twice a year. Routine eye exams. Ask your health care provider how often you should have your eyes checked. Personal lifestyle choices, including: Daily care of your teeth and gums. Regular physical activity. Eating a healthy diet. Avoiding tobacco and drug use. Limiting alcohol use. Practicing safe sex. Taking low-dose aspirin every day. Taking vitamin and mineral supplements as recommended by your health care provider. What happens during an annual well check? The services and screenings done by your health care provider during your annual well check will depend on your age, overall health, lifestyle risk factors, and family history of disease. Counseling  Your health care provider may ask you questions about your: Alcohol use. Tobacco use. Drug use. Emotional well-being. Home and relationship well-being. Sexual activity. Eating habits. History of falls. Memory and ability to understand (cognition). Work and work Astronomer. Reproductive health. Screening  You may have the following tests or measurements: Height, weight, and BMI. Blood pressure. Lipid and cholesterol levels. These may be checked every 5 years, or more frequently if you are over 45 years old. Skin check. Lung cancer screening. You may have this screening every year starting at age 62 if you have a 30-pack-year history of smoking and currently smoke or have quit  within the past 15 years. Fecal occult blood test (FOBT) of the stool. You may have this test every year starting at age 69. Flexible sigmoidoscopy or colonoscopy. You may have a sigmoidoscopy every 5 years or a colonoscopy every 10 years starting at age 49. Hepatitis C blood test. Hepatitis B blood test. Sexually transmitted disease (STD) testing. Diabetes screening. This is done by checking your blood sugar (glucose) after you have not eaten for a while (fasting). You may have this done every 1-3 years. Bone density scan.  This is done to screen for osteoporosis. You may have this done starting at age 43. Mammogram. This may be done every 1-2 years. Talk to your health care provider about how often you should have regular mammograms. Talk with your health care provider about your test results, treatment options, and if necessary, the need for more tests. Vaccines  Your health care provider may recommend certain vaccines, such as: Influenza vaccine. This is recommended every year. Tetanus, diphtheria, and acellular pertussis (Tdap, Td) vaccine. You may need a Td booster every 10 years. Zoster vaccine. You may need this after age 76. Pneumococcal 13-valent conjugate (PCV13) vaccine. One dose is recommended after age 19. Pneumococcal polysaccharide (PPSV23) vaccine. One dose is recommended after age 50. Talk to your health care provider about which screenings and vaccines you need and how often you need them. This information is not intended to replace advice given to you by your health care provider. Make sure you discuss any questions you have with your health care provider. Document Released: 10/31/2015 Document Revised: 06/23/2016 Document Reviewed: 08/05/2015 Elsevier Interactive Patient Education  2017 [employer].  Fall Prevention in the Home Falls can cause injuries. They can happen to people of all ages. There are many things you can do to make your home safe and to help prevent falls. What can I do on the outside of my home? Regularly fix the edges of walkways and driveways and fix any cracks. Remove anything that might make you trip as you walk through a door, such as a raised step or threshold. Trim any bushes or trees on the path to your home. Use bright outdoor lighting. Clear any walking paths of anything that might make someone trip, such as rocks or tools. Regularly check to see if handrails are loose or broken. Make sure that both sides of any steps have handrails. Any raised decks and porches  should have guardrails on the edges. Have any leaves, snow, or ice cleared regularly. Use sand or salt on walking paths during winter. Clean up any spills in your garage right away. This includes oil or grease spills. What can I do in the bathroom? Use night lights. Install grab bars by the toilet and in the tub and shower. Do not use towel bars as grab bars. Use non-skid mats or decals in the tub or shower. If you need to sit down in the shower, use a plastic, non-slip stool. Keep the floor dry. Clean up any water that spills on the floor as soon as it happens. Remove soap buildup in the tub or shower regularly. Attach bath mats securely with double-sided non-slip rug tape. Do not have throw rugs and other things on the floor that can make you trip. What can I do in the bedroom? Use night lights. Make sure that you have a light by your bed that is easy to reach. Do not use any sheets or blankets that are too big for your bed. They  should not hang down onto the floor. Have a firm chair that has side arms. You can use this for support while you get dressed. Do not have throw rugs and other things on the floor that can make you trip. What can I do in the kitchen? Clean up any spills right away. Avoid walking on wet floors. Keep items that you use a lot in easy-to-reach places. If you need to reach something above you, use a strong step stool that has a grab bar. Keep electrical cords out of the way. Do not use floor polish or wax that makes floors slippery. If you must use wax, use non-skid floor wax. Do not have throw rugs and other things on the floor that can make you trip. What can I do with my stairs? Do not leave any items on the stairs. Make sure that there are handrails on both sides of the stairs and use them. Fix handrails that are broken or loose. Make sure that handrails are as long as the stairways. Check any carpeting to make sure that it is firmly attached to the stairs.  Fix any carpet that is loose or worn. Avoid having throw rugs at the top or bottom of the stairs. If you do have throw rugs, attach them to the floor with carpet tape. Make sure that you have a light switch at the top of the stairs and the bottom of the stairs. If you do not have them, ask someone to add them for you. What else can I do to help prevent falls? Wear shoes that: Do not have high heels. Have rubber bottoms. Are comfortable and fit you well. Are closed at the toe. Do not wear sandals. If you use a stepladder: Make sure that it is fully opened. Do not climb a closed stepladder. Make sure that both sides of the stepladder are locked into place. Ask someone to hold it for you, if possible. Clearly mark and make sure that you can see: Any grab bars or handrails. First and last steps. Where the edge of each step is. Use tools that help you move around (mobility aids) if they are needed. These include: Canes. Walkers. Scooters. Crutches. Turn on the lights when you go into a dark area. Replace any light bulbs as soon as they burn out. Set up your furniture so you have a clear path. Avoid moving your furniture around. If any of your floors are uneven, fix them. If there are any pets around you, be aware of where they are. Review your medicines with your doctor. Some medicines can make you feel dizzy. This can increase your chance of falling. Ask your doctor what other things that you can do to help prevent falls. This information is not intended to replace advice given to you by your health care provider. Make sure you discuss any questions you have with your health care provider. Document Released: 07/31/2009 Document Revised: 03/11/2016 Document Reviewed: 11/08/2014 Elsevier Interactive Patient Education  2017 [employer].

## 2023-05-12 ENCOUNTER — Other Ambulatory Visit: Payer: Self-pay | Admitting: Family Medicine

## 2023-05-16 ENCOUNTER — Encounter: Payer: Self-pay | Admitting: Pharmacist

## 2023-05-16 NOTE — Progress Notes (Signed)
Patient previously followed by [facility] pharmacist. Per clinical review, no pharmacist appointment needed at this time.

## 2023-05-17 ENCOUNTER — Encounter: Payer: Medicare Other | Admitting: Physical Therapy

## 2023-05-23 ENCOUNTER — Encounter: Payer: Medicare Other | Admitting: Physical Therapy

## 2023-05-24 ENCOUNTER — Encounter: Payer: Medicare Other | Admitting: Pharmacist

## 2023-05-25 ENCOUNTER — Other Ambulatory Visit: Payer: Self-pay | Admitting: Family Medicine

## 2023-05-25 ENCOUNTER — Encounter: Payer: Medicare Other | Admitting: Physical Therapy

## 2023-05-25 DIAGNOSIS — E119 Type 2 diabetes mellitus without complications: Secondary | ICD-10-CM

## 2023-05-27 MED ORDER — METFORMIN HCL ER 500 MG PO TB24
1000.0000 mg | ORAL_TABLET | Freq: Two times a day (BID) | ORAL | 0 refills | Status: DC
Start: 2023-05-27 — End: 2023-11-29

## 2023-05-31 ENCOUNTER — Encounter: Payer: Medicare Other | Admitting: Physical Therapy

## 2023-06-02 ENCOUNTER — Encounter: Payer: Medicare Other | Admitting: Physical Therapy

## 2023-06-07 ENCOUNTER — Encounter: Payer: Medicare Other | Admitting: Physical Therapy

## 2023-06-07 ENCOUNTER — Other Ambulatory Visit: Payer: Self-pay | Admitting: *Deleted

## 2023-06-07 ENCOUNTER — Other Ambulatory Visit: Payer: Self-pay | Admitting: Family Medicine

## 2023-06-07 DIAGNOSIS — Z79899 Other long term (current) drug therapy: Secondary | ICD-10-CM

## 2023-06-07 DIAGNOSIS — M0609 Rheumatoid arthritis without rheumatoid factor, multiple sites: Secondary | ICD-10-CM

## 2023-06-07 DIAGNOSIS — J849 Interstitial pulmonary disease, unspecified: Secondary | ICD-10-CM

## 2023-06-07 MED ORDER — ACTEMRA ACTPEN 162 MG/0.9ML ~~LOC~~ SOAJ
162.0000 mg | SUBCUTANEOUS | 0 refills | Status: DC
Start: 2023-06-07 — End: 2023-09-07

## 2023-06-07 NOTE — Addendum Note (Signed)
Addended by: Henriette Combs on: 06/07/2023 02:05 PM   Modules accepted: Orders

## 2023-06-07 NOTE — Telephone Encounter (Signed)
Refill request received via fax from [facility] for Actemra.  Last Fill: 03/22/2023  Labs: 03/22/2023 CBC WNL .CMP stable.   TB Gold: 03/22/2023 Neg   Next Visit: 08/30/2023  Last Visit: 03/22/2023  GL:[id number] arthritis of multiple sites with negative rheumatoid factor   Current Dose per office note 03/22/2023: Actemra 162 mg subcutaneous injections every 14 days.   Okay to refill Actemra?

## 2023-06-08 ENCOUNTER — Other Ambulatory Visit: Payer: Self-pay | Admitting: Family Medicine

## 2023-06-09 ENCOUNTER — Encounter: Payer: Medicare Other | Admitting: Physical Therapy

## 2023-06-09 DIAGNOSIS — M961 Postlaminectomy syndrome, not elsewhere classified: Secondary | ICD-10-CM | POA: Diagnosis not present

## 2023-06-09 DIAGNOSIS — G894 Chronic pain syndrome: Secondary | ICD-10-CM | POA: Diagnosis not present

## 2023-06-09 DIAGNOSIS — M5134 Other intervertebral disc degeneration, thoracic region: Secondary | ICD-10-CM | POA: Diagnosis not present

## 2023-06-09 DIAGNOSIS — M4725 Other spondylosis with radiculopathy, thoracolumbar region: Secondary | ICD-10-CM | POA: Diagnosis not present

## 2023-06-09 DIAGNOSIS — K5903 Drug induced constipation: Secondary | ICD-10-CM | POA: Diagnosis not present

## 2023-06-09 DIAGNOSIS — M5137 Other intervertebral disc degeneration, lumbosacral region: Secondary | ICD-10-CM | POA: Diagnosis not present

## 2023-06-09 DIAGNOSIS — Z79891 Long term (current) use of opiate analgesic: Secondary | ICD-10-CM | POA: Diagnosis not present

## 2023-06-13 ENCOUNTER — Other Ambulatory Visit: Payer: Self-pay | Admitting: *Deleted

## 2023-06-13 DIAGNOSIS — Z79899 Other long term (current) drug therapy: Secondary | ICD-10-CM

## 2023-06-13 DIAGNOSIS — M0609 Rheumatoid arthritis without rheumatoid factor, multiple sites: Secondary | ICD-10-CM

## 2023-06-13 DIAGNOSIS — J849 Interstitial pulmonary disease, unspecified: Secondary | ICD-10-CM

## 2023-06-14 ENCOUNTER — Encounter: Payer: Medicare Other | Admitting: Physical Therapy

## 2023-06-15 ENCOUNTER — Other Ambulatory Visit: Payer: Self-pay | Admitting: Family Medicine

## 2023-06-15 DIAGNOSIS — E119 Type 2 diabetes mellitus without complications: Secondary | ICD-10-CM

## 2023-06-16 ENCOUNTER — Encounter: Payer: Medicare Other | Admitting: Physical Therapy

## 2023-06-21 ENCOUNTER — Encounter: Payer: Medicare Other | Admitting: Physical Therapy

## 2023-06-23 ENCOUNTER — Encounter: Payer: Medicare Other | Admitting: Physical Therapy

## 2023-06-23 ENCOUNTER — Ambulatory Visit: Payer: Medicare Other | Attending: Internal Medicine

## 2023-06-23 DIAGNOSIS — Z87891 Personal history of nicotine dependence: Secondary | ICD-10-CM | POA: Diagnosis not present

## 2023-06-23 DIAGNOSIS — J8489 Other specified interstitial pulmonary diseases: Secondary | ICD-10-CM

## 2023-06-23 DIAGNOSIS — M359 Systemic involvement of connective tissue, unspecified: Secondary | ICD-10-CM | POA: Diagnosis not present

## 2023-06-23 DIAGNOSIS — J849 Interstitial pulmonary disease, unspecified: Secondary | ICD-10-CM | POA: Insufficient documentation

## 2023-06-23 LAB — PULMONARY FUNCTION TEST ARMC ONLY
DL/VA % pred: 78 %
DL/VA: 3.31 ml/min/mmHg/L
DLCO unc % pred: 76 %
DLCO unc: 12.88 ml/min/mmHg
FEF 25-75 Pre: 2.1 L/s
FEF2575-%Pred-Pre: 136 %
FEV1-%Pred-Pre: 104 %
FEV1-Pre: 1.88 L
FEV1FVC-%Pred-Pre: 110 %
FEV6-%Pred-Pre: 98 %
FEV6-Pre: 2.25 L
FEV6FVC-%Pred-Pre: 105 %
FVC-%Pred-Pre: 93 %
FVC-Pre: 2.25 L
Pre FEV1/FVC ratio: 83 %
Pre FEV6/FVC Ratio: 100 %
RV % pred: 79 %
RV: 1.64 L
TLC % pred: 89 %
TLC: 4 L

## 2023-06-23 MED ORDER — ALBUTEROL SULFATE (2.5 MG/3ML) 0.083% IN NEBU: 2.5000 mg | INHALATION_SOLUTION | Freq: Once | RESPIRATORY_TRACT | Status: AC

## 2023-06-27 ENCOUNTER — Ambulatory Visit: Payer: Medicare Other | Admitting: Internal Medicine

## 2023-06-27 ENCOUNTER — Encounter: Payer: Self-pay | Admitting: Internal Medicine

## 2023-06-27 VITALS — BP 132/80 | HR 77 | Temp 98.5°F | Ht 60.0 in | Wt 178.0 lb

## 2023-06-27 DIAGNOSIS — M359 Systemic involvement of connective tissue, unspecified: Secondary | ICD-10-CM | POA: Diagnosis not present

## 2023-06-27 DIAGNOSIS — J8489 Other specified interstitial pulmonary diseases: Secondary | ICD-10-CM

## 2023-06-27 DIAGNOSIS — Z23 Encounter for immunization: Secondary | ICD-10-CM

## 2023-06-27 NOTE — Patient Instructions (Addendum)
Interstitial lung disease due to connective tissue disease (HCC) Drug allergy, multiple  Rheumatoid arthritis of multiple sites with negative rheumatoid factor (HCC) Raynaud's disease without gangrene  -You have very mild interstitial lung disease and this is due to rheumatoid arthritis   - [based on symptoms, walk test, pulmonary function test and CT scan] -Since 2013 there is very mild progression through 2022  - aug 2022 -> 06/27/2023 - stable on PFT  - glad beter from falls and walking  Plan  - continue actemra and prednisone for RA - currently no role for anti42fibrotic for lung unless documented worsening -- get echo [place]  next few weeks  - Do HRCT in 5-6 motnhs (last April 2022) - do spirometry and dlco in 5-6 months  Vaccine counseling Flu vaccine need  Plan  - high dose flu shot 06/27/2023 - RSV vaccine commercially - Please talk to [facility]CP [place], Tina Gallus, MD -  and ensure you get  shingrix (GSK) inactivated vaccine against shingles - covid mRNA vaccine whe available commercially   Follow-up - 5-6 months do spirometry and DLCO and HRCT on return to see Dr. Marchelle Gearing for follow-up in a 30-minute slot  -Symptom score and simple walking desaturation test at follow-up

## 2023-06-27 NOTE — Progress Notes (Signed)
OV 06/02/2021  Subjective:  Patient ID: Tina Berger, female , DOB: 1951-02-08 , age 72 y.o. , MRN: [id number] , ADDRESS: [address] [place] [address] PCP Tower, [name], MD Patient Care Team: Tower, [name], MD as PCP - General Mariah Milling, Tollie Pizza, MD as Consulting Physician (Cardiology) Phil Dopp, [facility] as Pharmacist (Pharmacist)  This Provider for this visit: Treatment Team:  Attending Provider: Kalman Shan, MD    06/02/2021 -   Chief Complaint  Patient presents with   Consult    Pt is being referred by Dr. Corliss Skains for ILD work up. Pt had PFT performed today.  Pt states that she has had problems with SOB for years but it has been just getting worse.     HPI Tina Berger 72 y.o. -referred by Dr. Corliss Skains for concern of rheumatoid arthritis ILD.  History is provided by the patient and also review of the records.  She tells me that in 2013 she used to work at [employer] and was exposed to chlorine at work and a few days later there was a birthday party and in the swimming pool there is a lot of chlorine.  And a few days after that started having significant shortness of breath with exertion relieved by rest.  Was hypoxemic was admitted for 5 days.  During this time she was also nitrofurantoin and was informed she had ILD because of chlorine exposure and nitrofurantoin.  After that she recovered and has been stable without any respiratory symptoms.  Then starting October 2021 started noticing increased fatigue and also in decreased mobility and hand and joint stiffness.  She did see Dr. Algis Downs in March and April 2022.  Was given a diagnosis of rheumatoid arthritis negative rheumatoid factor positive CCP.  Mixed with methotrexate was discussed but later patient declined it.  Then in April 2022 started Imuran.  She tells me she took Imuran for 8 weeks but then had vomiting fatigue fogginess and also scared about getting leukemia because she lost her son from  leukemia.  Therefore she stopped it.  She feels prednisone will help her a lot but she says that she has been advised about the side effects of prednisone.  She has multiple drug allergies.  She says because of her ILD she was advised about 2 options for her rheumatoid arthritis.  I am presuming based on chart review this is methotrexate and Imuran.  She says the sores are too restrictive.  She specifically wants to know how I would treat her ILD.  [facility] Comprehensive ILD Questionnaire  Symptoms:    SYMPTOM SCALE - ILD 06/02/2021   O2 use ra  Shortness of Breath 0 -> 5 scale with 5 being worst (score 6 If unable to do)  At rest 0  Simple tasks - showers, clothes change, eating, shaving 0  Household (dishes, doing bed, laundry) 0  Shopping 0  Walking level at own pace 0  Walking up Stairs 0  Total (30-36) Dyspnea Score 0  How bad is your cough? 0  How bad is your fatigue yes  How bad is nausea 0  How bad is vomiting?  0  How bad is diarrhea? 0  How bad is anxiety? ?  How bad is depression ?      Past Medical History : Positive for rheumatoid arthritis diagnosed in 2022.  She has history of irregular heart rate not otherwise specified.  She sees Dr. Mariah Milling.  She says  she has had echocardiogram and stress test 2010 years ago.  She is on Lopressor.  She has coronary artery calcification on the CT but she says she sees a cardiologist regularly.  She also has GERD and hiatal hernia.  She has diabetes she has thyroid disease not otherwise specified.  She has obesity   has a past medical history of Acute renal insufficiency (05/31/2014), Anemia, iron deficiency, Anxiety, Bilateral lower extremity edema, Cervical dysplasia, Colon cancer screening (06/14/2014), Coronary artery disease, Cough (08/08/2012), Degenerative disk disease (11/19/2011), Depression, Diabetes mellitus type II, Diverticulosis, DJD (degenerative joint disease), Drug rash (05/22/2011), Dry eyes, Dysrhythmia,  Edema, Elevated liver enzymes (03/21/2012), Encounter for routine gynecological examination (06/14/2014), ESOPHAGITIS (03/28/2007), Fibromyalgia, GASTRITIS (03/28/2007), GERD (gastroesophageal reflux disease), Hemorrhoids, HLD (hyperlipidemia), HNP (herniated nucleus pulposus) (11/1997), HTN (hypertension), Hyperglycemia (05/13/2008), Hypothyroidism, Interstitial lung disease (HCC), Left ovarian cyst, Osteoarthritis, Osteopenia, Other screening mammogram (08/18/2011), Palpitations, 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   has a past surgical history that includes Hand surgery (Left); Colonoscopy (08/2000); LASIK; Abdominal exploration surgery; ACDF (1991); and Biopsy of skin subcutaneous tissue and/or mucous membrane (Left, 04/08/2021).    ROS: She has significant fatigue she has arthralgia.  She has Raynaud's she has dry eyes dry mouth.  She has acid reflux.     FAMILY HISTORY of LUNG DISEASE: Positive for asthma in the family not otherwise specified but negative for COPD or pulmonary fibrosis or collagen vascular disease or vasculitis   EXPOSURE HISTORY: Smoked between 81 and 1970 to 10 cigarettes/day.  Does not smoke marijuana but no pipe no cocaine no marijuana use.  No intravenous drug use    HOME and HOBBY DETAILS : [place]  She has lived in the home for 42 years the age of the home is 61.  Built in 1969.  No dampness.  No mold or mildew in the house no humidifier use no CPAP use no nebulizer use no steam iron use no Jacuzzi use.  No misting [place] no parakeets inside the house no pet gerbils or hamsters.  No feather pillows or do way.  No mold in the [facility] duct.  No music habits no gardening habits no bird  feathers.  There was mold and mildew in the bathroom at work but that was in the remote past has not worked in many years.  The building was 73 years old   OCCUPATIONAL HISTORY (122 questions) : No organic or inorganic antigen exposure history.  Greater than 122 item   PULMONARY TOXICITY HISTORY (27 items): She did take Macrobid/nitrofurantoin in 2013.  She took Imuran for 8 weeks in 2022       Narrative & Impression  CLINICAL DATA:  72 year old female with history of shortness of breath. History of rheumatoid arthritis. Shortness of breath with activity.   EXAM: CT CHEST WITHOUT CONTRAST   TECHNIQUE: Multidetector CT imaging of the chest was performed following the standard protocol without intravenous contrast. High resolution imaging of the lungs, as well as inspiratory and expiratory imaging, was performed.   COMPARISON:  Chest CT 04/03/2012.   FINDINGS: Cardiovascular: Heart size is normal. There is no significant pericardial fluid, thickening or pericardial calcification. There is aortic atherosclerosis, as well as atherosclerosis of the great vessels of the mediastinum and the coronary arteries, including calcified atherosclerotic plaque in the  left main, left anterior descending and right coronary arteries.   Mediastinum/Nodes: No pathologically enlarged mediastinal or hilar lymph nodes. Please note that accurate exclusion of hilar adenopathy is limited on noncontrast CT scans. Esophagus is unremarkable in appearance. No axillary lymphadenopathy.   Lungs/Pleura: High-resolution images demonstrate patchy areas of ground-glass attenuation, septal thickening, mild subpleural reticulation and peripheral bronchiolectasis scattered throughout the lungs bilaterally. Findings have a definitive craniocaudal gradient and appear mildly progressive compared to the prior study, with slightly less ground-glass attenuation but more septal thickening and subpleural  reticulation. No frank honeycombing. Inspiratory and expiratory imaging demonstrates some mild air trapping indicative of small airways disease. In addition, there is collapse of the trachea and mainstem bronchi during expiration. No acute consolidative airspace disease. No pleural effusions. No suspicious appearing pulmonary nodules or masses are noted.   Upper Abdomen: Unremarkable.   Musculoskeletal: There are no aggressive appearing lytic or blastic lesions noted in the visualized portions of the skeleton.   IMPRESSION: 1. The appearance of the lungs is compatible with interstitial lung disease which is mildly progressive when compared to remote prior study from 9 years ago. The spectrum of findings on today's examination is categorized as probable usual interstitial pneumonia (UIP) per current ATS guidelines. 2. Aortic atherosclerosis, in addition to left main and 2 vessel coronary artery disease. Assessment for potential risk factor modification, dietary therapy or pharmacologic therapy may be warranted, if clinically indicated.   Aortic Atherosclerosis (ICD10-I70.0).     Electronically Signed   By: Trudie Reed M.D.   On: 01/19/2021 18:20       OV 03/26/2022  Subjective:  Patient ID: Tina Berger, female , DOB: 1951-06-23 , age 9 y.o. , MRN: [id number] , ADDRESS: [address] [place] [address] PCP Tower, [name], MD Patient Care Team: Tower, [name], MD as PCP - General Mariah Milling, Tollie Pizza, MD as Consulting Physician (Cardiology) Phil Dopp, [facility] as Pharmacist (Pharmacist)  This Provider for this visit: Treatment Team:  Attending Provider: Kalman Shan, MD    03/26/2022 -   Chief Complaint  Patient presents with   Follow-up    PFT performed today.  Pt states she has been doing okay since last visit and denies any complaints.     HPI Tina Berger 72 y.o. -presents for follow-up.  She is on observation from a pulmonary perspective  for interstitial lung disease.  This is because she has mild disease burden and we do not know if it is progressive phenotype as yet.  Since her last visit from a respiratory standpoint she says she is stable.  In fact her lung function is showing stability with FVC slightly better and the DLCO being the same.  They actually look normal.  Her main issue is that she is continuing to have significant issues with rheumatoid arthritis.  She did not tolerate leflunomide it caused some knuckle swelling confusing it for rheumatoid arthritis itself but when she stopped leflunomide approximately a month ago this got better.  She is on Actemra which she is tolerating well.  She has been on Actemra since October 2022 she is having significant joint pain.  Early in the morning her pain can be 5 out of 5.  She follows with Dr. Algis Downs for this.  Her pulmonary function test and walking desaturation test are stable.    OV 03/01/2023  Subjective:  Patient ID: Tina Berger, female , DOB: 04-26-1951 , age 71 y.o. , MRN: [id number] , ADDRESS: [address] [place] [facility] [address] PCP Tower, [name], MD Patient Care Team: Tower, [name], MD as PCP - General Mariah Milling, Tollie Pizza, MD as Consulting Physician (Cardiology) Kathyrn Sheriff, [facility] as Pharmacist (Pharmacist)  This Provider for this visit: Treatment Team:  Attending Provider: Kalman Shan, MD    03/01/2023 -   Chief Complaint  Patient presents with   Follow-up    F/up on PFT     HPI Tina Berger 72 y.o. -returns for follow-up.  I could not get a good finger pulse oximetry on her because of her Raynaud's history.  Nevertheless she feels stable from a respiratory standpoint.  Her symptom score is 0.  Her main issue is her rheumatoid arthritis.  She has had 3 falls since December 2023.  3 of them backwards.  The most recent one was February 14, 2023 when she slipped on a cat toy on the kitchen hardwood floor and she sustained a fracture to her left  wrist but also significant bruising to her left side of the face.  In fact the bruising is still evident today.  She is working on her balance issues.  She plans to join a balance class.  She is in significant pain from her rheumatoid arthritis.  She continues on Actemra.  She is on prednisone which she likes to get off.  She is also on pain medicine clinic.  She plans to transfer to rheumatology in [place] because this would be closer to her house.  But she is still plans to come here for pulmonary studies.  She had PFT today shows a slight decline but it could be range bound.  She is willing for a closer follow-up.  Last CT scan of the chest was in April 2022.    OV 06/27/2023  Subjective:  Patient ID: Tina Berger, female , DOB: 03-22-1951 , age 52 y.o. , MRN: [id number] , ADDRESS: [address] [place] [address] PCP Tower, [name], MD Patient Care Team: Tower, [name], MD as PCP - General Mariah Milling, Tollie Pizza, MD as Consulting Physician (Cardiology) Kathyrn Sheriff, [facility] (Inactive) as Pharmacist (Pharmacist)  This Provider for this visit: Treatment Team:  Attending Provider: Kalman Shan, MD    06/27/2023 -   Chief Complaint  Patient presents with   Follow-up    Breathing is overall doing well. She wakes up with some PND in the am. She denies cough. Would like a flu vaccine today.    Follow-up rheumatoid arthritis ILD -Mild disease burden  - Not on antifibrotic =-Last CT 2022 -Last PFT September 2024High risk prescription with  immunosuppressed status on Actemra -since October 2022. Chronic 5mg  per day prednisone  HPI Tina Berger 72 y.o. -presents for follow-up.  Since her last visit in May 2024.  She states the last fall was in April 2024.  She is dealing with the impact from the fall in terms of fractured teeth and dentition.  No further falls she is doing daily walks.  She continues 5 mg daily prednisone she is also on Actemra.  She continues there is no  side effects.  She says that overall her pain control has improved.  Her trigger finger is getting better and she is feeling better with the Actemra.  Dyspnea is the same.  Pulmonary function test done I reviewed results of the same.  There is no change in this.  Last CT scan of the chest was in 2022  Reviewed her vaccination.  She will  have the high-dose flu shot today.  Noticed that her shingles vaccine is a live vaccine in 2015.  Since then she has been immunosuppressed.  Recommended Shingrix which is an inactivated vaccine.  Also recommended RSV vaccine.       SYMPTOM SCALE - ILD 06/02/2021  03/26/2022  06/27/2023   O2 use ra ra ra  Shortness of Breath 0 -> 5 scale with 5 being worst (score 6 If unable to do)    At rest 0 0 0  Simple tasks - showers, clothes change, eating, shaving 0 1 1  Household (dishes, doing bed, laundry) 0 2 2  Shopping 0 2 2  Walking level at own pace 0 2 2  Walking up Stairs 0 3 3  Total (30-36) Dyspnea Score 0 10 10  How bad is your cough? 0 0 1  How bad is your fatigue yes 4 2  How bad is nausea 0 1 1  How bad is vomiting?  0 0 0  How bad is diarrhea? 0 0 0  How bad is anxiety? ? 2 3  How bad is depression ? 2 0  Pain  5      Simple office walk 185 feet x  3 laps goal with forehead probe 06/02/2021  03/26/2022   O2 used ra   Number laps completed 3   Comments about pace avg   Resting Pulse Ox/HR 97% and 71/min 95% and HR 80  Final Pulse Ox/HR 93% and 105/min 93% and HR 107  Desaturated </= 88% no no  Desaturated <= 3% points Yes 4 points Yes 2 points  Got Tachycardic >/= 90/min yes yes  Symptoms at end of test Moderate dyspnea Mild dyspnea   Miscellaneous comments x     CT Chest data from date: April 2022  - personally visualized and independently interpreted : NO - my findings are: as below  IMPRESSION: 1. The appearance of the lungs is compatible with interstitial lung disease which is mildly progressive when compared to remote  prior study from 9 years ago. The spectrum of findings on today's examination is categorized as probable usual interstitial pneumonia (UIP) per current ATS guidelines. 2. Aortic atherosclerosis, in addition to left main and 2 vessel coronary artery disease. Assessment for potential risk factor modification, dietary therapy or pharmacologic therapy may be warranted, if clinically indicated.   Aortic Atherosclerosis (ICD10-I70.0).     Electronically Signed   By: Trudie Reed M.D.   On: 01/19/2021 18:20    PFT     Latest Ref Rng & Units 06/23/2023   11:31 AM 03/01/2023    8:51 AM 03/26/2022   10:15 AM 06/02/2021   12:50 PM  ILD indicators  FVC-Pre L 2.25  P 2.22  2.35  2.16   FVC-Predicted Pre % 93  P 87  91  79   FVC-Post L    2.14   FVC-Predicted Post %    78   TLC L 4.00  P   4.29   TLC Predicted % 89  P   90   DLCO uncorrected ml/min/mmHg 12.88  P 13.10  15.17  14.84   DLCO UNC %Pred % 76  P 75  87  81   DLCO Corrected ml/min/mmHg  13.99  14.70  14.84   DLCO COR %Pred %  80  84  81     P Preliminary result      LAB RESULTS last 96 hours No results found.  LAB RESULTS last  90 days Recent Results (from the past 2160 hour(s))  Pulmonary Function Test [facility] Only     Status: None (Preliminary result)   Collection Time: 06/23/23 11:31 AM  Result Value Ref Range   FVC-Pre 2.25 L   FVC-%Pred-Pre 93 %   FEV1-Pre 1.88 L   FEV1-%Pred-Pre 104 %   FEV6-Pre 2.25 L   FEV6-%Pred-Pre 98 %   Pre FEV1/FVC ratio 83 %   FEV1FVC-%Pred-Pre 110 %   Pre FEV6/FVC Ratio 100 %   FEV6FVC-%Pred-Pre 105 %   FEF 25-75 Pre 2.10 L/sec   FEF2575-%Pred-Pre 136 %   RV 1.64 L   RV % pred 79 %   TLC 4.00 L   TLC % pred 89 %   DLCO unc 12.88 ml/min/mmHg   DLCO unc % pred 76 %   DL/VA 4.01 ml/min/mmHg/L   DL/VA % pred 78 %         has a past medical history of Acute renal insufficiency (05/31/2014), Anemia, iron deficiency, Anxiety, Bilateral lower extremity edema, Broken wrist,  Cervical dysplasia, Colon cancer screening (06/14/2014), Coronary artery disease, Cough (08/08/2012), Degenerative disk disease (11/19/2011), Depression, Diabetes mellitus type II, Diverticulosis, DJD (degenerative joint disease), Drug rash (05/22/2011), Dry eyes, Dysrhythmia, Edema, Elevated liver enzymes (03/21/2012), Encounter for routine gynecological examination (06/14/2014), ESOPHAGITIS (03/28/2007), Fibromyalgia, GASTRITIS (03/28/2007), GERD (gastroesophageal reflux disease), Hemorrhoids, HLD (hyperlipidemia), HNP (herniated nucleus pulposus) (11/1997), HTN (hypertension), Hyperglycemia (05/13/2008), Hypothyroidism, Interstitial lung disease (HCC), Left ovarian cyst, Osteoarthritis, Osteopenia, Other screening mammogram (08/18/2011), Palpitations, 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   reports that she quit smoking about 54 years ago. Her smoking use included cigarettes. She started smoking about 59 years ago. She has a 5 pack-year smoking history. She has never been exposed to tobacco smoke. She has never used smokeless tobacco.  Past Surgical History:  Procedure Laterality Date   ABDOMINAL EXPLORATION SURGERY     ACDF  1991   BIOPSY OF SKIN SUBCUTANEOUS TISSUE AND/OR MUCOUS MEMBRANE Left 04/08/2021   Procedure: BIOPSY OF SKIN SUBCUTANEOUS TISSUE AND/OR MUCOUS MEMBRANE ( REMOVAL SKIN NODULE);  Surgeon: Renford Dills, MD;  Location: [facility] ORS;  Service: Vascular;  Laterality: Left;   COLONOSCOPY  08/2000   Diverticulosis; hemorrhoids   HAND SURGERY Left    left thumb. PIN REMOVED   LASIK     bilateral    Allergies  Allergen Reactions   Ceftin [Cefuroxime] Swelling    Swelling, "legs turn blue".  Legs swelling, then blue, then a rash    Clonidine Derivatives     Swelling of lips   Erythromycin Swelling    Rash, swollen gums    Keflex [Cephalexin] Anaphylaxis    Chest was broken out in a rash. Lips were swelling. Took a few days to occur, but it kept getting worse.  Penicillins Anaphylaxis    Pt had a daughter with an allergy to this at a young age. Patient developed blisters anywhere her child touched her. Also, if she used the bathroom after daughter did while on keflex, it would cause a reaction for her. Lips and roof of mouth swelled up also.   Sulfonamide Derivatives Swelling    Rash, swollen gums, lips   Ciprofloxacin     REACTION: ? rash vs sun rxn. Rash    Fluoxetine Hcl     REACTION: stomach problems. Severe pain in abdomen. Could not function d/t pain   Furosemide Swelling    REACTION: swelling worsened in legs once taking   Gabapentin Other (See Comments)    REACTION: edema of feet. Unable to get shoes on   Hydroxychloroquine Itching    Pt report itching felt like "pre-anaphylaxis" she has had with other medications   Paroxetine     REACTION: weight gain (30 pound weight gain   Pregabalin     REACTION: swelling of feet and legs. Unable to get shoes on.   Tetracycline     REACTION:inflammed genitals.    Venlafaxine     REACTION: sweating profusely   Etodolac     REACTION: reaction not known   Macrobid [Nitrofurantoin]     REACTION: ? Lung problem    Amitriptyline Hcl     REACTION: sedating   Atorvastatin     REACTION: muscle twitch and pain   Benicar [Olmesartan Medoxomil]     Muscle pain    Cephalexin Hives and Rash   Cetirizine Hcl     REACTION: headache   Diovan [Valsartan]     Thought it made her feel confused   Naproxen Sodium Other (See Comments)    REACTION: edema of feet and legs.  Not good for  ckd    Immunization History  Administered Date(s) Administered   Fluad Quad(high Dose 65+) 08/07/2020, 07/13/2022   Fluad Trivalent(High Dose 65+) 06/27/2023   Influenza Split 06/19/2011   Influenza Whole 08/01/2009   Influenza, High Dose Seasonal PF 08/14/2018, 07/03/2019   Influenza,inj,Quad PF,6+ Mos 06/14/2014, 08/25/2015, 07/26/2016, 08/19/2017   Moderna Sars-Covid-2 Vaccination 09/17/2020   PFIZER(Purple Top)SARS-COV-2 Vaccination 11/27/2019, 12/18/2019   Pneumococcal Conjugate-13 08/25/2015   Pneumococcal Polysaccharide-23 08/25/2016   Td 11/25/2003   Tdap 06/14/2014   Zoster, Live 09/16/2014    Family History  Problem Relation Age of Onset   Lung cancer Mother        + smoker   Coronary artery disease Mother        relatively young   Emphysema Father        + smoker   Lymphoma Sister    Heart disease Brother    Lymphoma Brother    Parkinson's disease Brother    Anemia Brother        aplastic    Diabetes Brother    Iron deficiency Daughter    Leukemia Son    Breast cancer Neg Hx      Current Outpatient Medications:    Acetaminophen (TYLENOL ARTHRITIS PAIN PO), Take 650 mg by mouth daily., Disp: , Rfl:    buPROPion (WELLBUTRIN XL) 150 MG 24 hr tablet, TAKE 1 TABLET BY MOUTH DAILY, Disp: 90 tablet, Rfl: 1   Chlorphen-Phenyleph-ASA (ALKA-SELTZER PLUS COLD PO), Take by mouth as needed. Just for colds, Disp: , Rfl:    cholecalciferol (VITAMIN D3) 25 MCG (1000 UNIT) tablet, Take 1,000 Units by mouth daily., Disp: , Rfl:    cyclobenzaprine (  FLEXERIL) 5 MG tablet, Take 5 mg by mouth daily as needed., Disp: , Rfl:    Diclofenac Sodium 1.5 % SOLN, Place 2 mLs onto the skin 4 (four) times daily., Disp: 3 Bottle, Rfl: 3   DULoxetine (CYMBALTA) 60 MG capsule, Take 1 capsule (60 mg total) by mouth daily., Disp: 90 capsule, Rfl: 3   esomeprazole (NEXIUM) 20 MG capsule, TAKE 1 CAPSULE BY MOUTH DAILY, Disp: 90 capsule, Rfl: 1   famotidine (PEPCID) 40 MG tablet, TAKE 1 TABLET  BY MOUTH DAILY, Disp: 90 tablet, Rfl: 1   ferrous sulfate 325 (65 FE) MG EC tablet, Take 325 mg by mouth daily with breakfast., Disp: , Rfl:    glucose blood test strip, One Touch Ultra stripts blue-To check sugar once daily and as needed for DM2 250.00, Disp: 100 each, Rfl: 3   GUAIFENESIN CR PO, Take by mouth as needed., Disp: , Rfl:    HYDROmorphone HCl (EXALGO) 12 MG T24A SR tablet, Take 12 mg by mouth 2 (two) times daily., Disp: , Rfl:    hyoscyamine (LEVSIN SL) 0.125 MG SL tablet, TAKE 1 TABLET BY MOUTH EVERY 4 HOURS AS NEEDED FOR CRAMPING., Disp: 270 tablet, Rfl: 1   ketoconazole (NIZORAL) 2 % shampoo, SHAMPOO WITH A SMALL AMOUNT AS DIRECTED THREE TIMES A WEEK SHAMPOO SCALP 3 DAYS A WEEK, LET SHAMPOO SIT FOR 10 MINUTES AND RINSE OFF, Disp: 120 mL, Rfl: 2   levothyroxine (SYNTHROID) 125 MCG tablet, Take 1 tablet (125 mcg total) by mouth daily before breakfast., Disp: 90 tablet, Rfl: 2   LYSINE PO, Take by mouth as needed. OTC for ulcer prophylaxis, Disp: , Rfl:    Magnesium 400 MG CAPS, Take by mouth daily., Disp: , Rfl:    metFORMIN (GLUCOPHAGE-XR) 500 MG 24 hr tablet, Take 2 tablets (1,000 mg total) by mouth 2 (two) times daily with a meal., Disp: 360 tablet, Rfl: 0   metoprolol succinate (TOPROL-XL) 50 MG 24 hr tablet, TAKE 1 TABLET BY MOUTH TWICE  DAILY, Disp: 180 tablet, Rfl: 0   naloxone (NARCAN) nasal spray 4 mg/0.1 mL, , Disp: , Rfl:    ondansetron (ZOFRAN) 4 MG tablet, Take 1 tablet (4 mg total) by mouth every 8 (eight) hours as needed for nausea or vomiting., Disp: 20 tablet, Rfl: 0   oxyCODONE (OXYCONTIN) 10 MG 12 hr tablet, Take 10 mg by mouth 3 (three) times daily as needed. Beakthrough pain, Disp: , Rfl:    predniSONE (DELTASONE) 10 MG tablet, Take 1 tablet (10 mg total) by mouth daily with breakfast., Disp: 30 tablet, Rfl: 2   ramipril (ALTACE) 5 MG capsule, TAKE 1 CAPSULE BY MOUTH DAILY, Disp: 90 capsule, Rfl: 1   rosuvastatin (CRESTOR) 5 MG tablet, TAKE 1 TABLET BY MOUTH TWICE   WEEKLY, Disp: 29 tablet, Rfl: 0   tiZANidine (ZANAFLEX) 4 MG tablet, Take 4 mg by mouth every 8 (eight) hours as needed for muscle spasms., Disp: , Rfl:    Tocilizumab (ACTEMRA ACTPEN) 162 MG/0.9ML SOAJ, Inject 162 mg into the skin every 14 (fourteen) days., Disp: 5.4 mL, Rfl: 0   Xylitol (XYLIMELTS MT), Use as directed in the mouth or throat., Disp: , Rfl:  No current facility-administered medications for this visit.  Facility-Administered Medications Ordered in Other Visits:    albuterol (PROVENTIL) (2.5 MG/3ML) 0.083% nebulizer solution 2.5 mg, 2.5 mg, Nebulization, Once, Kalman Shan, MD      Objective:   Vitals:   06/27/23 1119  BP: 132/80  Pulse: 77  Temp: 98.5 F (36.9 C)  TempSrc: Oral  SpO2: 97%  Weight: 178 lb (80.7 kg)  Height: 5' (1.524 m)    Estimated body mass index is 34.76 kg/m as calculated from the following:   Height as of this encounter: 5' (1.524 m).   Weight as of this encounter: 178 lb (80.7 kg).  @WEIGHTCHANGE @  [employer]   06/27/23 1119  Weight: 178 lb (80.7 kg)     Physical Exam   General: No distress. cushingoid O2 at rest: no Cane present: no Sitting in wheel chair: no Frail: no Obese: no Neuro: Alert and Oriented x 3. GCS 15. Speech normal Psych: Pleasant Resp:  Barrel Chest - no.  Wheeze - o, Crackles - yes, No overt respiratory distress CVS: Normal heart sounds. Murmurs - no Ext: Stigmata of Connective Tissue Disease - FA HEENT: Normal upper airway. PEERL +. No post nasal drip        Assessment:       ICD-10-CM   1. Interstitial lung disease due to connective tissue disease (HCC)  J84.89 ECHOCARDIOGRAM COMPLETE   M35.9 CT Chest High Resolution    2. Need for influenza vaccination  Z23 Flu Vaccine Trivalent High Dose (Fluad)         Plan:     Patient Instructions  Interstitial lung disease due to connective tissue disease (HCC) Drug allergy, multiple  Rheumatoid arthritis of multiple sites with negative  rheumatoid factor (HCC) Raynaud's disease without gangrene  -You have very mild interstitial lung disease and this is due to rheumatoid arthritis   - [based on symptoms, walk test, pulmonary function test and CT scan] -Since 2013 there is very mild progression through 2022  - aug 2022 -> 06/27/2023 - stable on PFT  - glad beter from falls and walking  Plan  - continue actemra and prednisone for RA - currently no role for anti90fibrotic for lung unless documented worsening -- get echo In [place]  next few weeks  - Do HRCT in 5-6 motnhs (last April 2022) - do spirometry and dlco in 5-6 months  Vaccine counseling Flu vaccine need  Plan  - high dose flu shot 06/27/2023 - RSV vaccine commercially - Please talk to PCP Tower, [name], MD -  and ensure you get  shingrix (GSK) inactivated vaccine against shingles - covid mRNA vaccine whe available commercially   Follow-up - 5-6 months do spirometry and DLCO and HRCT on return to see Dr. Marchelle Gearing for follow-up in a 30-minute slot  -Symptom score and simple walking desaturation test at follow-up   FOLLOWUP Return in about 6 months (around 12/25/2023) for 30 min visit, after Cleda Daub and DLCO, after HRCT chest, with Dr Marchelle Gearing, Face to Face Visit.    SIGNATURE    Dr. Kalman Shan, M.D., F.C.C.P,  Pulmonary and Critical Care Medicine Staff Physician, [facility] Center Director - Interstitial Lung Disease  Program  Pulmonary Fibrosis [facility] at [facility] [place], [place], [address]  Pager: [phone], If no answer or between  15:00h - 7:00h: call [phone] Telephone: [phone]  12:08 PM 06/27/2023

## 2023-06-28 ENCOUNTER — Encounter: Payer: Medicare Other | Admitting: Physical Therapy

## 2023-06-30 ENCOUNTER — Encounter: Payer: Medicare Other | Admitting: Physical Therapy

## 2023-07-06 ENCOUNTER — Other Ambulatory Visit: Payer: Self-pay | Admitting: Family Medicine

## 2023-07-07 NOTE — Telephone Encounter (Signed)
Pt is due for her CPE (labs prior) on or after 07/15/23, please schedule and then route back to me to refill. Thanks

## 2023-07-12 ENCOUNTER — Other Ambulatory Visit: Payer: Self-pay | Admitting: Physician Assistant

## 2023-07-12 NOTE — Telephone Encounter (Signed)
Last Fill: 09/20/2022  Next Visit: 08/30/2023  Last Visit: 03/22/2023  Dx: Rheumatoid arthritis of multiple sites with negative rheumatoid factor   Current Dose per office note on 03/22/2023: Prednisone 10 mg daily.   Okay to refill Prednisone?

## 2023-07-16 ENCOUNTER — Other Ambulatory Visit: Payer: Self-pay | Admitting: Family Medicine

## 2023-07-18 NOTE — Telephone Encounter (Signed)
Last filled on 03/30/22 #270 tabs/ 1 refill.  Last OV was on 12/29/22 for hospital f/u

## 2023-07-21 ENCOUNTER — Other Ambulatory Visit: Payer: Self-pay | Admitting: Family Medicine

## 2023-07-21 DIAGNOSIS — E119 Type 2 diabetes mellitus without complications: Secondary | ICD-10-CM

## 2023-07-25 NOTE — Telephone Encounter (Signed)
Pt's overdue for her CPE (labs prior) please schedule and then route back to me to refill

## 2023-07-26 NOTE — Telephone Encounter (Signed)
Lvm for patient tcb and schedule 

## 2023-07-26 NOTE — Telephone Encounter (Signed)
LVM for patient to c/b and schedule.  

## 2023-08-02 ENCOUNTER — Other Ambulatory Visit: Payer: Self-pay | Admitting: Family Medicine

## 2023-08-19 NOTE — Progress Notes (Deleted)
Office Visit Note  Patient: Tina Berger             Date of Birth: [birth year]           MRN: [id number]             PCP: Judy Pimple, MD Referring: Tower, [name], MD Visit Date: 08/30/2023 Occupation: @GUAROCC @  Subjective:  No chief complaint on file.   History of Present Illness: Tina Berger is a 72 y.o. female ***     Activities of Daily Living:  Patient reports morning stiffness for *** {minute/hour:19697}.   Patient {ACTIONS;DENIES/REPORTS:21021675::"Denies"} nocturnal pain.  Difficulty dressing/grooming: {ACTIONS;DENIES/REPORTS:21021675::"Denies"} Difficulty climbing stairs: {ACTIONS;DENIES/REPORTS:21021675::"Denies"} Difficulty getting out of chair: {ACTIONS;DENIES/REPORTS:21021675::"Denies"} Difficulty using hands for taps, buttons, cutlery, and/or writing: {ACTIONS;DENIES/REPORTS:21021675::"Denies"}  No Rheumatology ROS completed.   PMFS History:  Patient Active Problem List   Diagnosis Date Noted   Hematoma of flank 12/29/2022   Frequent falls 12/29/2022   Dysuria 09/07/2021   Rheumatoid arthritis (HCC) 05/27/2021   Chronic venous insufficiency 05/23/2021   AVM (arteriovenous malformation) 04/04/2021   CAD (coronary artery disease) 01/22/2021   Primary osteoarthritis of both knees 01/12/2021   Positive ANA (antinuclear antibody) 11/17/2020   Joint pain 11/13/2020   Current use of proton pump inhibitor 08/26/2020   Estrogen deficiency 08/21/2018   TMJ (dislocation of temporomandibular joint), initial encounter 05/05/2018   Anterior neck pain 05/05/2018   Chronic pain syndrome 11/09/2017   Chronic upper extremity pain [facility]) (Bilateral) (L>R) 11/09/2017   Fibromyalgia syndrome 11/09/2017   Osteoarthritis 11/09/2017   Osteoarthritis of lumbar facet joint (Bilateral) 11/09/2017   Lumbar facet arthropathy (Bilateral) 11/09/2017   Lumbar facet syndrome (Bilateral) (L>R) 11/09/2017   Lumbar foraminal stenosis (multilevel) (Bilateral)  11/09/2017   DDD (degenerative disc disease), lumbar 11/09/2017   Thoracic levoscoliosis 11/09/2017   Thoracic facet syndrome (Bilateral) (L>R) 11/09/2017   Thoracic facet arthropathy (Bilateral) (R>L) 11/09/2017   DDD (degenerative disc disease), thoracic 11/09/2017   Thoracolumbar IVDD 11/09/2017   DDD (degenerative disc disease), cervical 11/09/2017   Osteoarthritis of cervical facet (Bilateral) 11/09/2017   Grade 1 Anterolisthesis of C7 over T1 11/09/2017   Cervical foraminal stenosis (Bilateral) 11/09/2017   History of fusion of cervical spine (C5-6 ACDF) 11/09/2017   Cervical facet syndrome (Bilateral) 11/09/2017   Disorder of skeletal system 11/09/2017   Cervical radiculitis (Bilateral) 11/09/2017   Lumbar Epidural lipomatosis 11/09/2017   Chronic sacroiliac joint pain (Bilateral) (L>R) 11/09/2017   Chronic hip pain (Bilateral) (L>R) 11/09/2017   Chronic upper back pain (Primary Area of Pain) (midline) 10/24/2017   Chronic neck pain (Secondary Area of Pain) (Bilateral)  (L>R) 10/24/2017   Chronic low back pain (Fourth Area of Pain) (Bilateral) (L>R) 10/24/2017   Chronic lower extremity pain (Fifth Area of Pain) (Bilateral) (L>R) 10/24/2017   Lumbar Grade 1 Retrolisthesis of L1-2 and L2-3 10/24/2017   Other long term (current) drug therapy 10/24/2017   Other specified health status 10/24/2017   Long term current use of opiate analgesic 10/24/2017   Long term prescription opiate use 10/24/2017   Opiate use 10/24/2017   DM type 2 (diabetes mellitus, type 2) (HCC) 06/14/2014   Pharmacologic therapy 06/14/2014   Pedal edema    Post-menopausal 06/23/2012   Lumbar disc disease with radiculopathy 11/18/2011   HTN (hypertension) 07/30/2011   Raynaud disease 07/30/2011   Obesity 07/30/2011   Routine general medical examination at a health care facility 03/28/2011   Palpitations 04/27/2010   Depression with  anxiety 06/14/2008   Vitamin D deficiency 05/13/2008   ANXIETY 03/25/2008    Osteopenia 03/25/2008   Hypothyroidism 03/28/2007   Hyperlipidemia associated with type 2 diabetes mellitus (HCC) 03/28/2007   Other iron deficiency anemias 03/28/2007   PANIC ATTACK 03/28/2007   KERATOCONJUNCTIVITIS SICCA 03/28/2007   Mitral valve disorder 03/28/2007   ABNORMAL HEART RHYTHMS 03/28/2007   Raynaud's syndrome 03/28/2007   GERD 03/28/2007   IBS 03/28/2007   Rosacea 03/28/2007   PLANTAR FASCIITIS 03/28/2007   MIGRAINES, HX OF 03/28/2007    Past Medical History:  Diagnosis Date   Acute renal insufficiency 05/31/2014   pt not aware of this diagnosis   Anemia, iron deficiency    Anxiety    Bilateral lower extremity edema    a. uses torsemide   Broken wrist    did not require surgery   Cervical dysplasia    abnormal paps   Colon cancer screening 06/14/2014   Coronary artery disease    Cough 08/08/2012   Degenerative disk disease 11/19/2011   Depression    Diabetes mellitus type II    Diverticulosis    DJD (degenerative joint disease)    Drug rash 05/22/2011   Dry eyes    Dysrhythmia    metoprolol.   Edema    Elevated liver enzymes 03/21/2012   Encounter for routine gynecological examination 06/14/2014   ESOPHAGITIS 03/28/2007   Qualifier: Hospitalized for  By: Lindwood Qua CMA, Jerl Santos     Fibromyalgia    GASTRITIS 03/28/2007   Qualifier: History of  By: Lindwood Qua CMA, Jerl Santos     GERD (gastroesophageal reflux disease)    Hemorrhoids    external   HLD (hyperlipidemia)    HNP (herniated nucleus pulposus) 11/1997   T6,7,8 with DJD   HTN (hypertension)    Hyperglycemia 05/13/2008   Qualifier: Diagnosis of  By: Milinda Antis MD, Colon Flattery    Hypothyroidism    Interstitial lung disease (HCC)    pt not aware of this   Left ovarian cyst    x 3, rupture   Osteoarthritis    hands   Osteopenia    mild-11/01; improved 12/05   Other screening mammogram 08/18/2011   Palpitations    PERSONAL HISTORY ALLERGY UNSPEC MEDICINAL AGENT 03/18/2010   Qualifier:  Diagnosis of  By: Milinda Antis MD, Colon Flattery    PONV (postoperative nausea and vomiting)    Raynaud's disease    Recurrent HSV (herpes simplex virus)    lesions in nose or mouth with frequent ulcers. d/t meds and dry mouth   Rheumatoid arthritis (HCC)    Rhinitis 03/21/2012   Routine general medical examination at a health care facility 03/28/2011    Family History  Problem Relation Age of Onset   Lung cancer Mother        + smoker   Coronary artery disease Mother        relatively young   Emphysema Father        + smoker   Lymphoma Sister    Heart disease Brother    Lymphoma Brother    Parkinson's disease Brother    Anemia Brother        aplastic    Diabetes Brother    Iron deficiency Daughter    Leukemia Son    Breast cancer Neg Hx    Past Surgical History:  Procedure Laterality Date   ABDOMINAL EXPLORATION SURGERY     ACDF  1991   BIOPSY OF SKIN SUBCUTANEOUS TISSUE AND/OR MUCOUS MEMBRANE  Left 04/08/2021   Procedure: BIOPSY OF SKIN SUBCUTANEOUS TISSUE AND/OR MUCOUS MEMBRANE ( REMOVAL SKIN NODULE);  Surgeon: Renford Dills, MD;  Location: [facility] ORS;  Service: Vascular;  Laterality: Left;   COLONOSCOPY  08/2000   Diverticulosis; hemorrhoids   HAND SURGERY Left    left thumb. 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   Lives with husband. Feels safe in her home.   Immunization History  Administered Date(s) Administered   Fluad Quad(high Dose 65+) 08/07/2020, 07/13/2022   Fluad Trivalent(High Dose 65+) 06/27/2023   Influenza Split 06/19/2011   Influenza Whole 08/01/2009   Influenza, High Dose Seasonal PF 08/14/2018, 07/03/2019   Influenza,inj,Quad PF,6+ Mos 06/14/2014, 08/25/2015, 07/26/2016, 08/19/2017   Moderna Sars-Covid-2 Vaccination 09/17/2020   PFIZER(Purple Top)SARS-COV-2 Vaccination 11/27/2019, 12/18/2019   Pneumococcal Conjugate-13  08/25/2015   Pneumococcal Polysaccharide-23 08/25/2016   Td 11/25/2003   Tdap 06/14/2014   Zoster, Live 09/16/2014     Objective: Vital Signs: There were no vitals taken for this visit.   Physical Exam   Musculoskeletal Exam: ***  CDAI Exam: CDAI Score: -- Patient Global: --; Provider Global: -- Swollen: --; Tender: -- Joint Exam 08/30/2023   No joint exam has been documented for this visit   There is currently no information documented on the homunculus. Go to the Rheumatology activity and complete the homunculus joint exam.  Investigation: No additional findings.  Imaging: No results found.  Recent Labs: Lab Results  Component Value Date   WBC 6.7 03/22/2023   HGB 13.7 03/22/2023   PLT 302 03/22/2023   NA 138 03/22/2023   K 5.3 03/22/2023   CL 98 03/22/2023   CO2 31 03/22/2023   GLUCOSE 120 (H) 03/22/2023   BUN 27 (H) 03/22/2023   CREATININE 0.91 03/22/2023   BILITOT 0.3 03/22/2023   ALKPHOS 44 12/26/2022   AST 13 03/22/2023   ALT 10 03/22/2023   PROT 7.2 03/22/2023   PROT 7.3 03/22/2023   ALBUMIN 3.6 12/26/2022   CALCIUM 9.5 03/22/2023   GFRAA 75 03/24/2021   QFTBGOLDPLUS NEGATIVE 03/22/2023    Speciality Comments: Arava- mood disorder  Procedures:  No procedures performed Allergies: Ceftin [cefuroxime], Clonidine derivatives, Erythromycin, Keflex [cephalexin], Penicillins, Sulfonamide derivatives, Ciprofloxacin, Fluoxetine hcl, Furosemide, Gabapentin, Hydroxychloroquine, Paroxetine, Pregabalin, Tetracycline, Venlafaxine, Etodolac, Macrobid [nitrofurantoin], Amitriptyline hcl, Atorvastatin, Benicar [olmesartan medoxomil], Cephalexin, Cetirizine hcl, Diovan [valsartan], and Naproxen sodium   Assessment / Plan:     Visit Diagnoses: No diagnosis found.  Orders: No orders of the defined types were placed in this encounter.  No orders of the defined types were placed in this encounter.   Face-to-face time spent with patient was *** minutes. Greater  than 50% of time was spent in counseling and coordination of care.  Follow-Up Instructions: No follow-ups on file.   Ellen Henri, CMA  Note - This record has been created using Animal nutritionist.  Chart creation errors have been sought, but may not always  have been located. Such creation errors do not reflect on  the standard of medical care.

## 2023-08-22 ENCOUNTER — Other Ambulatory Visit: Payer: Self-pay | Admitting: Family Medicine

## 2023-08-23 ENCOUNTER — Encounter: Payer: Self-pay | Admitting: Family Medicine

## 2023-08-23 ENCOUNTER — Other Ambulatory Visit: Payer: Self-pay | Admitting: Family Medicine

## 2023-08-23 NOTE — Telephone Encounter (Signed)
Patient has been scheduled

## 2023-08-23 NOTE — Telephone Encounter (Signed)
Please call pt, she is overdue for CPE (labs prior) looks like she is having a hard time scheduling that on mychart

## 2023-08-24 ENCOUNTER — Other Ambulatory Visit: Payer: Self-pay | Admitting: Family Medicine

## 2023-08-24 DIAGNOSIS — Z1231 Encounter for screening mammogram for malignant neoplasm of breast: Secondary | ICD-10-CM

## 2023-08-25 DIAGNOSIS — M4725 Other spondylosis with radiculopathy, thoracolumbar region: Secondary | ICD-10-CM | POA: Diagnosis not present

## 2023-08-25 DIAGNOSIS — M5134 Other intervertebral disc degeneration, thoracic region: Secondary | ICD-10-CM | POA: Diagnosis not present

## 2023-08-25 DIAGNOSIS — G894 Chronic pain syndrome: Secondary | ICD-10-CM | POA: Diagnosis not present

## 2023-08-25 DIAGNOSIS — M5451 Vertebrogenic low back pain: Secondary | ICD-10-CM | POA: Diagnosis not present

## 2023-08-25 DIAGNOSIS — M961 Postlaminectomy syndrome, not elsewhere classified: Secondary | ICD-10-CM | POA: Diagnosis not present

## 2023-08-25 DIAGNOSIS — Z79891 Long term (current) use of opiate analgesic: Secondary | ICD-10-CM | POA: Diagnosis not present

## 2023-08-25 DIAGNOSIS — K5903 Drug induced constipation: Secondary | ICD-10-CM | POA: Diagnosis not present

## 2023-08-30 ENCOUNTER — Ambulatory Visit: Payer: Medicare Other | Admitting: Rheumatology

## 2023-08-30 DIAGNOSIS — Z8719 Personal history of other diseases of the digestive system: Secondary | ICD-10-CM

## 2023-08-30 DIAGNOSIS — E1169 Type 2 diabetes mellitus with other specified complication: Secondary | ICD-10-CM

## 2023-08-30 DIAGNOSIS — N1831 Chronic kidney disease, stage 3a: Secondary | ICD-10-CM

## 2023-08-30 DIAGNOSIS — M170 Bilateral primary osteoarthritis of knee: Secondary | ICD-10-CM

## 2023-08-30 DIAGNOSIS — Z8639 Personal history of other endocrine, nutritional and metabolic disease: Secondary | ICD-10-CM

## 2023-08-30 DIAGNOSIS — M5134 Other intervertebral disc degeneration, thoracic region: Secondary | ICD-10-CM

## 2023-08-30 DIAGNOSIS — M0609 Rheumatoid arthritis without rheumatoid factor, multiple sites: Secondary | ICD-10-CM

## 2023-08-30 DIAGNOSIS — E119 Type 2 diabetes mellitus without complications: Secondary | ICD-10-CM

## 2023-08-30 DIAGNOSIS — G8929 Other chronic pain: Secondary | ICD-10-CM

## 2023-08-30 DIAGNOSIS — M8589 Other specified disorders of bone density and structure, multiple sites: Secondary | ICD-10-CM

## 2023-08-30 DIAGNOSIS — M47816 Spondylosis without myelopathy or radiculopathy, lumbar region: Secondary | ICD-10-CM

## 2023-08-30 DIAGNOSIS — L97929 Non-pressure chronic ulcer of unspecified part of left lower leg with unspecified severity: Secondary | ICD-10-CM

## 2023-08-30 DIAGNOSIS — J849 Interstitial pulmonary disease, unspecified: Secondary | ICD-10-CM

## 2023-08-30 DIAGNOSIS — I10 Essential (primary) hypertension: Secondary | ICD-10-CM

## 2023-08-30 DIAGNOSIS — Z981 Arthrodesis status: Secondary | ICD-10-CM

## 2023-08-30 DIAGNOSIS — G894 Chronic pain syndrome: Secondary | ICD-10-CM

## 2023-08-30 DIAGNOSIS — Z8774 Personal history of (corrected) congenital malformations of heart and circulatory system: Secondary | ICD-10-CM

## 2023-08-30 DIAGNOSIS — M5030 Other cervical disc degeneration, unspecified cervical region: Secondary | ICD-10-CM

## 2023-08-30 DIAGNOSIS — I7300 Raynaud's syndrome without gangrene: Secondary | ICD-10-CM

## 2023-08-30 DIAGNOSIS — M797 Fibromyalgia: Secondary | ICD-10-CM

## 2023-08-30 DIAGNOSIS — Z79891 Long term (current) use of opiate analgesic: Secondary | ICD-10-CM

## 2023-08-30 DIAGNOSIS — Z79899 Other long term (current) drug therapy: Secondary | ICD-10-CM

## 2023-08-30 DIAGNOSIS — E559 Vitamin D deficiency, unspecified: Secondary | ICD-10-CM

## 2023-08-30 DIAGNOSIS — F418 Other specified anxiety disorders: Secondary | ICD-10-CM

## 2023-08-30 DIAGNOSIS — I059 Rheumatic mitral valve disease, unspecified: Secondary | ICD-10-CM

## 2023-08-30 DIAGNOSIS — Z8781 Personal history of (healed) traumatic fracture: Secondary | ICD-10-CM

## 2023-09-04 ENCOUNTER — Telehealth: Payer: Self-pay | Admitting: Family Medicine

## 2023-09-04 DIAGNOSIS — E559 Vitamin D deficiency, unspecified: Secondary | ICD-10-CM

## 2023-09-04 DIAGNOSIS — E039 Hypothyroidism, unspecified: Secondary | ICD-10-CM

## 2023-09-04 DIAGNOSIS — I10 Essential (primary) hypertension: Secondary | ICD-10-CM

## 2023-09-04 DIAGNOSIS — Z79899 Other long term (current) drug therapy: Secondary | ICD-10-CM

## 2023-09-04 DIAGNOSIS — D508 Other iron deficiency anemias: Secondary | ICD-10-CM

## 2023-09-04 DIAGNOSIS — E1169 Type 2 diabetes mellitus with other specified complication: Secondary | ICD-10-CM

## 2023-09-04 DIAGNOSIS — E119 Type 2 diabetes mellitus without complications: Secondary | ICD-10-CM

## 2023-09-04 NOTE — Telephone Encounter (Signed)
-----   Message from Alvina Chou sent at 08/25/2023  9:11 AM EST ----- Regarding: Lab orders for Mon, 11.18.24 Patient is scheduled for CPX labs, please order future labs, Thanks , Camelia Eng

## 2023-09-05 ENCOUNTER — Other Ambulatory Visit (INDEPENDENT_AMBULATORY_CARE_PROVIDER_SITE_OTHER): Payer: Medicare Other

## 2023-09-05 DIAGNOSIS — Z79899 Other long term (current) drug therapy: Secondary | ICD-10-CM | POA: Diagnosis not present

## 2023-09-05 DIAGNOSIS — D508 Other iron deficiency anemias: Secondary | ICD-10-CM

## 2023-09-05 DIAGNOSIS — E559 Vitamin D deficiency, unspecified: Secondary | ICD-10-CM

## 2023-09-05 DIAGNOSIS — E119 Type 2 diabetes mellitus without complications: Secondary | ICD-10-CM | POA: Diagnosis not present

## 2023-09-05 DIAGNOSIS — E039 Hypothyroidism, unspecified: Secondary | ICD-10-CM | POA: Diagnosis not present

## 2023-09-05 DIAGNOSIS — I10 Essential (primary) hypertension: Secondary | ICD-10-CM

## 2023-09-05 DIAGNOSIS — E1169 Type 2 diabetes mellitus with other specified complication: Secondary | ICD-10-CM | POA: Diagnosis not present

## 2023-09-05 DIAGNOSIS — E785 Hyperlipidemia, unspecified: Secondary | ICD-10-CM

## 2023-09-05 LAB — COMPREHENSIVE METABOLIC PANEL
ALT: 12 U/L (ref 0–35)
AST: 15 U/L (ref 0–37)
Albumin: 4 g/dL (ref 3.5–5.2)
Alkaline Phosphatase: 48 U/L (ref 39–117)
BUN: 16 mg/dL (ref 6–23)
CO2: 32 meq/L (ref 19–32)
Calcium: 8.8 mg/dL (ref 8.4–10.5)
Chloride: 99 meq/L (ref 96–112)
Creatinine, Ser: 0.86 mg/dL (ref 0.40–1.20)
GFR: 66.95 mL/min (ref >60.00)
Glucose, Bld: 146 mg/dL — ABNORMAL HIGH (ref 70–99)
Potassium: 4.9 meq/L (ref 3.5–5.1)
Sodium: 140 meq/L (ref 135–145)
Total Bilirubin: 0.4 mg/dL (ref 0.2–1.2)
Total Protein: 6.5 g/dL (ref 6.0–8.3)

## 2023-09-05 LAB — LIPID PANEL
Cholesterol: 162 mg/dL (ref 0–200)
HDL: 51.4 mg/dL (ref >39.00)
LDL Cholesterol: 82 mg/dL (ref 0–99)
NonHDL: 110.3
Total CHOL/HDL Ratio: 3
Triglycerides: 141 mg/dL (ref 0.0–149.0)
VLDL: 28.2 mg/dL (ref 0.0–40.0)

## 2023-09-05 LAB — CBC WITH DIFFERENTIAL/PLATELET
Basophils Absolute: 0.1 10*3/uL (ref 0.0–0.1)
Basophils Relative: 0.9 % (ref 0.0–3.0)
Eosinophils Absolute: 0.1 10*3/uL (ref 0.0–0.7)
Eosinophils Relative: 1.6 % (ref 0.0–5.0)
HCT: 40.3 % (ref 36.0–46.0)
Hemoglobin: 13.2 g/dL (ref 12.0–15.0)
Lymphocytes Relative: 15.4 % (ref 12.0–46.0)
Lymphs Abs: 0.9 10*3/uL (ref 0.7–4.0)
MCHC: 32.7 g/dL (ref 30.0–36.0)
MCV: 94 fL (ref 78.0–100.0)
Monocytes Absolute: 0.5 10*3/uL (ref 0.1–1.0)
Monocytes Relative: 8.7 % (ref 3.0–12.0)
Neutro Abs: 4.4 10*3/uL (ref 1.4–7.7)
Neutrophils Relative %: 73.4 % (ref 43.0–77.0)
Platelets: 323 10*3/uL (ref 150.0–400.0)
RBC: 4.29 Mil/uL (ref 3.87–5.11)
RDW: 12.4 % (ref 11.5–15.5)
WBC: 6 10*3/uL (ref 4.0–10.5)

## 2023-09-05 LAB — IRON: Iron: 80 ug/dL (ref 42–145)

## 2023-09-05 LAB — TSH: TSH: 2.51 u[IU]/mL (ref 0.35–5.50)

## 2023-09-05 LAB — VITAMIN D 25 HYDROXY (VIT D DEFICIENCY, FRACTURES): VITD: 51.1 ng/mL (ref 30.00–100.00)

## 2023-09-05 LAB — VITAMIN B12: Vitamin B-12: 294 pg/mL (ref 211–911)

## 2023-09-05 LAB — HEMOGLOBIN A1C: Hgb A1c MFr Bld: 6.2 % (ref 4.6–6.5)

## 2023-09-06 LAB — MICROALBUMIN / CREATININE URINE RATIO
Creatinine,U: 106.3 mg/dL
Microalb Creat Ratio: 1.7 mg/g (ref 0.0–30.0)
Microalb, Ur: 1.8 mg/dL (ref 0.0–1.9)

## 2023-09-07 ENCOUNTER — Other Ambulatory Visit: Payer: Self-pay | Admitting: *Deleted

## 2023-09-07 DIAGNOSIS — M0609 Rheumatoid arthritis without rheumatoid factor, multiple sites: Secondary | ICD-10-CM

## 2023-09-07 DIAGNOSIS — Z79899 Other long term (current) drug therapy: Secondary | ICD-10-CM

## 2023-09-07 DIAGNOSIS — J849 Interstitial pulmonary disease, unspecified: Secondary | ICD-10-CM

## 2023-09-07 MED ORDER — ACTEMRA ACTPEN 162 MG/0.9ML ~~LOC~~ SOAJ: 162.0000 mg | SUBCUTANEOUS | 0 refills | Status: DC

## 2023-09-07 NOTE — Telephone Encounter (Signed)
Refill request received via fax from [facility] for Actemra  Last Fill: 06/07/2023  Labs: 09/05/2023 Glucose 146  TB Gold: 03/22/2023 Neg    Next Visit: 09/13/2023  Last Visit: 03/22/2023  [id number] arthritis of multiple sites with negative rheumatoid factor   Current Dose per office note 03/22/2023:  Actemra 162 mg subcutaneous injections every 14 days.   Okay to refill Actemra?

## 2023-09-12 ENCOUNTER — Ambulatory Visit (INDEPENDENT_AMBULATORY_CARE_PROVIDER_SITE_OTHER): Payer: Medicare Other | Admitting: Family Medicine

## 2023-09-12 ENCOUNTER — Encounter: Payer: Self-pay | Admitting: Family Medicine

## 2023-09-12 VITALS — BP 134/84 | HR 99 | Temp 98.0°F | Resp 16 | Ht 59.75 in | Wt 176.2 lb

## 2023-09-12 DIAGNOSIS — I7300 Raynaud's syndrome without gangrene: Secondary | ICD-10-CM

## 2023-09-12 DIAGNOSIS — I2510 Atherosclerotic heart disease of native coronary artery without angina pectoris: Secondary | ICD-10-CM | POA: Diagnosis not present

## 2023-09-12 DIAGNOSIS — F418 Other specified anxiety disorders: Secondary | ICD-10-CM

## 2023-09-12 DIAGNOSIS — Z0000 Encounter for general adult medical examination without abnormal findings: Secondary | ICD-10-CM

## 2023-09-12 DIAGNOSIS — E559 Vitamin D deficiency, unspecified: Secondary | ICD-10-CM

## 2023-09-12 DIAGNOSIS — E66811 Obesity, class 1: Secondary | ICD-10-CM

## 2023-09-12 DIAGNOSIS — E039 Hypothyroidism, unspecified: Secondary | ICD-10-CM

## 2023-09-12 DIAGNOSIS — E785 Hyperlipidemia, unspecified: Secondary | ICD-10-CM

## 2023-09-12 DIAGNOSIS — M0609 Rheumatoid arthritis without rheumatoid factor, multiple sites: Secondary | ICD-10-CM

## 2023-09-12 DIAGNOSIS — E1169 Type 2 diabetes mellitus with other specified complication: Secondary | ICD-10-CM

## 2023-09-12 DIAGNOSIS — K219 Gastro-esophageal reflux disease without esophagitis: Secondary | ICD-10-CM

## 2023-09-12 DIAGNOSIS — L989 Disorder of the skin and subcutaneous tissue, unspecified: Secondary | ICD-10-CM | POA: Insufficient documentation

## 2023-09-12 DIAGNOSIS — E6609 Other obesity due to excess calories: Secondary | ICD-10-CM

## 2023-09-12 DIAGNOSIS — M8589 Other specified disorders of bone density and structure, multiple sites: Secondary | ICD-10-CM | POA: Diagnosis not present

## 2023-09-12 DIAGNOSIS — I10 Essential (primary) hypertension: Secondary | ICD-10-CM

## 2023-09-12 DIAGNOSIS — Z7984 Long term (current) use of oral hypoglycemic drugs: Secondary | ICD-10-CM | POA: Diagnosis not present

## 2023-09-12 DIAGNOSIS — Z6834 Body mass index (BMI) 34.0-34.9, adult: Secondary | ICD-10-CM

## 2023-09-12 DIAGNOSIS — E119 Type 2 diabetes mellitus without complications: Secondary | ICD-10-CM

## 2023-09-12 DIAGNOSIS — Z0001 Encounter for general adult medical examination with abnormal findings: Secondary | ICD-10-CM

## 2023-09-12 DIAGNOSIS — I700 Atherosclerosis of aorta: Secondary | ICD-10-CM | POA: Insufficient documentation

## 2023-09-12 NOTE — Assessment & Plan Note (Signed)
Under care of rheumatolgy for this and fibromyaltia  Taking sctemra  Down to 2 mg of prednisone daily  Also oxycodone and hydromortphone from pain clinic

## 2023-09-12 NOTE — Assessment & Plan Note (Addendum)
Reassuring exam Better than usual today Takes a beta blocker  toprol xl 50 mg bid

## 2023-09-12 NOTE — Assessment & Plan Note (Signed)
Dexa reviewed 04/2023 D level normal  Discussed fall prevention, supplements and exercise for bone density  Exercise limited by chronic pain  One fall /one fracture since last visit  Needs dental work before considering treatment in future

## 2023-09-12 NOTE — Assessment & Plan Note (Signed)
No symptoms Sees cardiology  On twice weekly crestor  Declines zetia  LDL in 80s

## 2023-09-12 NOTE — Assessment & Plan Note (Signed)
Doing well with current medicines  Wellbutrin xl 150 mg daily  Cymbalta 60 mg daily   Tolerates well  In setting of chronic pain  Encouraged physical activity when able   PHQ 0

## 2023-09-12 NOTE — Assessment & Plan Note (Signed)
Improved Lab Results  Component Value Date   HGBA1C 6.2 09/05/2023    Taking metformin XR 1000 mg bid and tolerating it  Plans to schedule eye appointment Microalb utd  On statin  Eating less junk , commended  Exercise is a challenge Follow up 6 mo

## 2023-09-12 NOTE — Assessment & Plan Note (Signed)
Reviewed health habits including diet and exercise and skin cancer prevention Reviewed appropriate screening tests for age  Also reviewed health mt list, fam hx and immunization status , as well as social and family history   See HPI Labs reviewed and ordered Plans to inquire about shingrix and RSV vaccine at pharmacy  Mammogram planned this week  Declines colon cancer screening  Discussed fall prevention, supplements and exercise for bone density   PHQ 0   Health Maintenance  Topic Date Due   Zoster (Shingles) Vaccine (1 of 2) 02/06/1970   Cologuard (Stool DNA test)  09/04/2019   Eye exam for diabetics  03/05/2023   Complete foot exam   03/24/2023   COVID-19 Vaccine (4 - 2023-24 season) 06/19/2023   Mammogram  07/10/2023   Hemoglobin A1C  03/04/2024   Medicare Annual Wellness Visit  05/09/2024   DTaP/Tdap/Td vaccine (3 - Td or Tdap) 06/14/2024   Yearly kidney function blood test for diabetes  09/04/2024   Yearly kidney health urinalysis for diabetes  09/04/2024   DEXA scan (bone density measurement)  04/18/2025   Pneumonia Vaccine  Completed   Flu Shot  Completed   Hepatitis C Screening  Completed   HPV Vaccine  Aged Out   Colon Cancer Screening  Discontinued

## 2023-09-12 NOTE — Assessment & Plan Note (Signed)
Lab Results  Component Value Date   TSH 2.51 09/05/2023   Plan to continue levothyroxine 125 mcg daily

## 2023-09-12 NOTE — Patient Instructions (Addendum)
If you are interested in the new shingles vaccine (Shingrix) - call your local pharmacy to check on coverage and availability   Also get your RSV vaccine in the pharmacy   Call and schedule your eye exam   B12 is in the low normal range Get vitamin B12 over the counter and take 500 mcg daily   Take care of yourself   Keep working on muscle strength   Get you dental work done   Let us know where you want [place] to send your medicines

## 2023-09-12 NOTE — Assessment & Plan Note (Signed)
Disc goals for lipids and reasons to control them Rev last labs with pt Rev low sat fat diet in detail  LDL is down to 82 -good  Tolerates crestor 5 mg twice weekly  Declined addn of zetia   Has known mild cad and aortic dz

## 2023-09-12 NOTE — Assessment & Plan Note (Signed)
Last vitamin D Lab Results  Component Value Date   25OHVITD2 <1.0 10/24/2017   25OHVITD3 65 10/24/2017   VD25OH 51.10 09/05/2023   Vitamin D level is therapeutic with current supplementation Disc importance of this to bone and overall health

## 2023-09-12 NOTE — Assessment & Plan Note (Signed)
Discussed how this problem influences overall health and the risks it imposes  Reviewed plan for weight loss with lower calorie diet (via better food choices (lower glycemic and portion control) along with exercise building up to or more than 30 minutes 5 days per week including some aerobic activity and strength training    Unsure if she would be a candidate for glp-1 med in future in light of many GI issues and food intolerances

## 2023-09-12 NOTE — Assessment & Plan Note (Signed)
bp in fair control at this time  BP Readings from Last 1 Encounters:  09/12/23 134/84   No changes needed Most recent labs reviewed  Disc lifstyle change with low sodium diet and exercise  Plan to continue metoprolol xl 50 mg bid Ramipril 5 mg daily   Pulse per pt is always high at office and fine at home

## 2023-09-12 NOTE — Assessment & Plan Note (Signed)
Small area of scale on r lower leg itches May be early SK but cannot r/o other process Referral done to dermatology

## 2023-09-12 NOTE — Assessment & Plan Note (Signed)
Takes both nexium 20 mg daily and pepcid 40 mg daily  Symptoms are controlled  Does not tolerate being off medicines A lot of dietary intolerances also

## 2023-09-12 NOTE — Assessment & Plan Note (Signed)
Reviewed last cardiology note No clinical changes  Blood pressure controlled  Cholesterol is improved

## 2023-09-12 NOTE — Progress Notes (Signed)
Subjective:    Patient ID: Tina Berger, female    DOB: [birth year], 72 y.o.   MRN: [id number]  HPI  Here for health maintenance exam and to review chronic medical problems   Wt Readings from Last 3 Encounters:  09/12/23 176 lb 4 oz (79.9 kg)  06/27/23 178 lb (80.7 kg)  05/10/23 169 lb (76.7 kg)   34.71 kg/m  Vitals:   09/12/23 1442  BP: 134/84  Pulse: 99  Resp: 16  Temp: 98 F (36.7 C)  SpO2: 93%    Immunization History  Administered Date(s) Administered   Fluad Quad(high Dose 65+) 08/07/2020, 07/13/2022   Fluad Trivalent(High Dose 65+) 06/27/2023   Influenza Split 06/19/2011   Influenza Whole 08/01/2009   Influenza, High Dose Seasonal PF 08/14/2018, 07/03/2019   Influenza,inj,Quad PF,6+ Mos 06/14/2014, 08/25/2015, 07/26/2016, 08/19/2017   Moderna Sars-Covid-2 Vaccination 09/17/2020   PFIZER(Purple Top)SARS-COV-2 Vaccination 11/27/2019, 12/18/2019   Pneumococcal Conjugate-13 08/25/2015   Pneumococcal Polysaccharide-23 08/25/2016   Td 11/25/2003   Tdap 06/14/2014   Zoster, Live 09/16/2014    Health Maintenance Due  Topic Date Due   Zoster Vaccines- Shingrix (1 of 2) 02/06/1969   Fecal DNA (Cologuard)  09/04/2019   OPHTHALMOLOGY EXAM  03/05/2023   FOOT EXAM  03/24/2023   COVID-19 Vaccine (4 - 2023-24 season) 06/19/2023   MAMMOGRAM  07/10/2023   Has a hard time  Chronic sweating -drives her crazy   Shingrix -needs go get   Eye exam-planning next week   Mammogram is scheduled for this week  Self breast exam - no lumps  Declines breast exam today   Gyn health No problems  No new partners    Colon cancer screening  Cologuard neg 2017, then declined further colon cancer screening  Continues to decline    Bone health  Dexa  04/2023  osteopenia  Falls-last fall was in April /wearing slippers , fell face first /caused facial swelling  Fractures-one wrist fracture (caught herself)  Supplements  Last vitamin D Lab Results  Component Value Date    25OHVITD2 <1.0 10/24/2017   25OHVITD3 65 10/24/2017   VD25OH 51.10 09/05/2023    Exercise : Limited by RA and chronic pain  Tried PT but she did not like the guy  (for fitness)  Could not do it that early in am     Mood    09/12/2023    3:34 PM 05/10/2023    4:09 PM 12/29/2022    3:03 PM 07/13/2022    2:06 PM 05/05/2022    3:10 PM  Depression screen PHQ 2/9  Decreased Interest 0 0 0 0 0  Down, Depressed, Hopeless 0 0 0 0 0  PHQ - 2 Score 0 0 0 0 0  Altered sleeping 0  0 0 0  Tired, decreased energy 0  1 3 0  Change in appetite 0  1 3 0  Feeling bad or failure about yourself  0  0 0 0  Trouble concentrating 0  1 0 0  Moving slowly or fidgety/restless 0  0 3 0  Suicidal thoughts 0  0 0 0  PHQ-9 Score 0  3 9 0  Difficult doing work/chores Not difficult at all  Not difficult at all Somewhat difficult Not difficult at all   Depression with anxiety   Wellbutrin xl 150 mg daily  Cymbalta 60 mg daily   Has both RA and fibromyalgia  Sees Dr Jon Billings tomorrow  Takes chronic hydromorphone and oxycodone- wants to decrease the  oxy  Mindful about that     HTN  In setting of CAD  and raynaud's and mild aortic atherosclerosis  Sees cardiology-Dr Gollan  bp is stable today  No cp or palpitations or headaches or edema  No side effects to medicines  BP Readings from Last 3 Encounters:  09/12/23 134/84  06/27/23 132/80  03/22/23 (!) 143/86     Metoprolol xl 50 mg daily  Ramipril 5 mg daily   Pulse Readings from Last 3 Encounters:  09/12/23 99  06/27/23 77  03/22/23 71   Pulse is usually high at the office/anxious    Lab Results  Component Value Date   NA 140 09/05/2023   K 4.9 09/05/2023   CO2 32 09/05/2023   GLUCOSE 146 (H) 09/05/2023   BUN 16 09/05/2023   CREATININE 0.86 09/05/2023   CALCIUM 8.8 09/05/2023   GFR 66.95 09/05/2023   EGFR 67 03/22/2023   GFRNONAA >60 12/26/2022   Lab Results  Component Value Date   ALT 12 09/05/2023   AST 15 09/05/2023    ALKPHOS 48 09/05/2023   BILITOT 0.4 09/05/2023   Lab Results  Component Value Date   WBC 6.0 09/05/2023   HGB 13.2 09/05/2023   HCT 40.3 09/05/2023   MCV 94.0 09/05/2023   PLT 323.0 09/05/2023     GERD Pepcid 40 mg daily  Nexium 20 mg daily  Lab Results  Component Value Date   VITAMINB12 294 09/05/2023   DM2 Lab Results  Component Value Date   HGBA1C 6.2 09/05/2023   This is down from 7.1 Diet limited due to GI intolerances  Metformin xr 1000 mg bid    Current prednisone   Lab Results  Component Value Date   LABMICR 14.3 07/06/2022   LABMICR See below: 09/29/2021   MICROALBUR 1.8 09/05/2023    Takes 2 mg prednisone daily from rheumatologist    Hyperlipidemia Lab Results  Component Value Date   CHOL 162 09/05/2023   CHOL 181 07/06/2022   CHOL 192 03/23/2022   Lab Results  Component Value Date   HDL 51.40 09/05/2023   HDL 48 07/06/2022   HDL 64.60 03/23/2022   Lab Results  Component Value Date   LDLCALC 82 09/05/2023   LDLCALC 113 (H) 07/06/2022   LDLCALC 88 03/23/2022   Lab Results  Component Value Date   TRIG 141.0 09/05/2023   TRIG 108 07/06/2022   TRIG 197.0 (H) 03/23/2022   Lab Results  Component Value Date   CHOLHDL 3 09/05/2023   CHOLHDL 3.8 07/06/2022   CHOLHDL 3 03/23/2022   Lab Results  Component Value Date   LDLDIRECT 149.0 07/26/2016   LDLDIRECT 136.0 08/25/2015   LDLDIRECT 135.6 10/08/2011   Crestor 5 mg twice weekly as tolerated  Was prescribed generic zetia- decided not to take  Latest LDL 82  Watching diet    Hypothyroidism  Pt has no clinical changes No change in energy level/ hair or skin/ edema and no tremor Lab Results  Component Value Date   TSH 2.51 09/05/2023    Levothyroxine 125 mcg daily  Skin spot on right lower leg  Scale  Itches       Patient Active Problem List   Diagnosis Date Noted   Aortic atherosclerosis (HCC) 09/12/2023   Skin lesion 09/12/2023   Frequent falls 12/29/2022    Dysuria 09/07/2021   Rheumatoid arthritis (HCC) 05/27/2021   Chronic venous insufficiency 05/23/2021   AVM (arteriovenous malformation) 04/04/2021   CAD (coronary artery  disease) 01/22/2021   Primary osteoarthritis of both knees 01/12/2021   Positive ANA (antinuclear antibody) 11/17/2020   Joint pain 11/13/2020   Current use of proton pump inhibitor 08/26/2020   Estrogen deficiency 08/21/2018   TMJ (dislocation of temporomandibular joint), initial encounter 05/05/2018   Anterior neck pain 05/05/2018   Chronic pain syndrome 11/09/2017   Chronic upper extremity pain [facility] Area of Pain) (Bilateral) (L>R) 11/09/2017   Fibromyalgia syndrome 11/09/2017   Osteoarthritis 11/09/2017   Osteoarthritis of lumbar facet joint (Bilateral) 11/09/2017   Lumbar facet arthropathy (Bilateral) 11/09/2017   Lumbar facet syndrome (Bilateral) (L>R) 11/09/2017   Lumbar foraminal stenosis (multilevel) (Bilateral) 11/09/2017   DDD (degenerative disc disease), lumbar 11/09/2017   Thoracic levoscoliosis 11/09/2017   Thoracic facet syndrome (Bilateral) (L>R) 11/09/2017   Thoracic facet arthropathy (Bilateral) (R>L) 11/09/2017   DDD (degenerative disc disease), thoracic 11/09/2017   Thoracolumbar IVDD 11/09/2017   DDD (degenerative disc disease), cervical 11/09/2017   Osteoarthritis of cervical facet (Bilateral) 11/09/2017   Grade 1 Anterolisthesis of C7 over T1 11/09/2017   Cervical foraminal stenosis (Bilateral) 11/09/2017   History of fusion of cervical spine (C5-6 ACDF) 11/09/2017   Cervical facet syndrome (Bilateral) 11/09/2017   Disorder of skeletal system 11/09/2017   Cervical radiculitis (Bilateral) 11/09/2017   Lumbar Epidural lipomatosis 11/09/2017   Chronic sacroiliac joint pain (Bilateral) (L>R) 11/09/2017   Chronic hip pain (Bilateral) (L>R) 11/09/2017   Chronic upper back pain (Primary Area of Pain) (midline) 10/24/2017   Chronic neck pain (Secondary Area of Pain) (Bilateral)  (L>R)  10/24/2017   Chronic low back pain (Fourth Area of Pain) (Bilateral) (L>R) 10/24/2017   Chronic lower extremity pain (Fifth Area of Pain) (Bilateral) (L>R) 10/24/2017   Lumbar Grade 1 Retrolisthesis of L1-2 and L2-3 10/24/2017   Other long term (current) drug therapy 10/24/2017   Other specified health status 10/24/2017   Long term current use of opiate analgesic 10/24/2017   Long term prescription opiate use 10/24/2017   Opiate use 10/24/2017   DM type 2 (diabetes mellitus, type 2) (HCC) 06/14/2014   Pharmacologic therapy 06/14/2014   Pedal edema    Post-menopausal 06/23/2012   Lumbar disc disease with radiculopathy 11/18/2011   HTN (hypertension) 07/30/2011   Raynaud disease 07/30/2011   Obesity 07/30/2011   Routine general medical examination at a health care facility 03/28/2011   Palpitations 04/27/2010   Depression with anxiety 06/14/2008   Vitamin D deficiency 05/13/2008   Osteopenia 03/25/2008   Hypothyroidism 03/28/2007   Hyperlipidemia associated with type 2 diabetes mellitus (HCC) 03/28/2007   Other iron deficiency anemias 03/28/2007   PANIC ATTACK 03/28/2007   KERATOCONJUNCTIVITIS SICCA 03/28/2007   Mitral valve disorder 03/28/2007   GERD 03/28/2007   IBS 03/28/2007   Rosacea 03/28/2007   PLANTAR FASCIITIS 03/28/2007   MIGRAINES, HX OF 03/28/2007   Past Medical History:  Diagnosis Date   Acute renal insufficiency 05/31/2014   pt not aware of this diagnosis   Anemia, iron deficiency    Anxiety    Bilateral lower extremity edema    a. uses torsemide   Broken wrist    did not require surgery   Cervical dysplasia    abnormal paps   Colon cancer screening 06/14/2014   Coronary artery disease    Cough 08/08/2012   Degenerative disk disease 11/19/2011   Depression    Diabetes mellitus type II    Diverticulosis    DJD (degenerative joint disease)    Drug rash 05/22/2011   Dry eyes  Dysrhythmia    metoprolol.   Edema    Elevated liver enzymes 03/21/2012    Encounter for routine gynecological examination 06/14/2014   ESOPHAGITIS 03/28/2007   Qualifier: Hospitalized for  By: Lindwood Qua CMA, Jerl Santos     Fibromyalgia    GASTRITIS 03/28/2007   Qualifier: History of  By: Lindwood Qua CMA, Jerl Santos     GERD (gastroesophageal reflux disease)    Hemorrhoids    external   HLD (hyperlipidemia)    HNP (herniated nucleus pulposus) 11/1997   T6,7,8 with DJD   HTN (hypertension)    Hyperglycemia 05/13/2008   Qualifier: Diagnosis of  By: Milinda Antis MD, Colon Flattery    Hypothyroidism    Interstitial lung disease (HCC)    pt not aware of this   Left ovarian cyst    x 3, rupture   Osteoarthritis    hands   Osteopenia    mild-11/01; improved 12/05   Other screening mammogram 08/18/2011   Palpitations    PERSONAL HISTORY ALLERGY UNSPEC MEDICINAL AGENT 03/18/2010   Qualifier: Diagnosis of  By: Milinda Antis MD, Colon Flattery    PONV (postoperative nausea and vomiting)    Raynaud's disease    Recurrent HSV (herpes simplex virus)    lesions in nose or mouth with frequent ulcers. d/t meds and dry mouth   Rheumatoid arthritis (HCC)    Rhinitis 03/21/2012   Routine general medical examination at a health care facility 03/28/2011   Past Surgical History:  Procedure Laterality Date   ABDOMINAL EXPLORATION SURGERY     ACDF  1991   BIOPSY OF SKIN SUBCUTANEOUS TISSUE AND/OR MUCOUS MEMBRANE Left 04/08/2021   Procedure: BIOPSY OF SKIN SUBCUTANEOUS TISSUE AND/OR MUCOUS MEMBRANE ( REMOVAL SKIN NODULE);  Surgeon: Renford Dills, MD;  Location: [facility] ORS;  Service: Vascular;  Laterality: Left;   COLONOSCOPY  08/2000   Diverticulosis; hemorrhoids   HAND SURGERY Left    left thumb. PIN REMOVED   LASIK     bilateral   Social History   Tobacco Use   Smoking status: Former    Current packs/day: 0.00    Average packs/day: 1 pack/day for 5.0 years (5.0 ttl pk-yrs)    Types: Cigarettes    Start date: 10/19/1963    Quit date: 10/18/1968    Years since quitting: 54.9     Passive exposure: Never   Smokeless tobacco: Never   Tobacco comments:    quit over 40 years  Vaping Use   Vaping status: Never Used  Substance Use Topics   Alcohol use: No    Alcohol/week: 0.0 standard drinks of alcohol   Drug use: No   Family History  Problem Relation Age of Onset   Lung cancer Mother        + smoker   Coronary artery disease Mother        relatively young   Emphysema Father        + smoker   Lymphoma Sister    Heart disease Brother    Lymphoma Brother    Parkinson's disease Brother    Anemia Brother        aplastic    Diabetes Brother    Iron deficiency Daughter    Leukemia Son    Breast cancer Neg Hx    Allergies  Allergen Reactions   Ceftin [Cefuroxime] Swelling    Swelling, "legs turn blue".  Legs swelling, then blue, then a rash   Clonidine Derivatives     Swelling of lips  Erythromycin Swelling    Rash, swollen gums    Keflex [Cephalexin] Anaphylaxis    Chest was broken out in a rash. Lips were swelling. Took a few days to occur, but it kept getting worse.                                                                                                                                                                                                   Penicillins Anaphylaxis    Pt had a daughter with an allergy to this at a young age. Patient developed blisters anywhere her child touched her. Also, if she used the bathroom after daughter did while on keflex, it would cause a reaction for her. Lips and roof of mouth swelled up also.   Sulfonamide Derivatives Swelling    Rash, swollen gums, lips   Ciprofloxacin     REACTION: ? rash vs sun rxn. Rash    Fluoxetine Hcl     REACTION: stomach problems. Severe pain in abdomen. Could not function d/t pain   Furosemide Swelling    REACTION: swelling worsened in legs once taking   Gabapentin Other (See Comments)    REACTION: edema of feet. Unable to get shoes on   Hydroxychloroquine Itching    Pt report  itching felt like "pre-anaphylaxis" she has had with other medications   Paroxetine     REACTION: weight gain (30 pound weight gain   Pregabalin     REACTION: swelling of feet and legs. Unable to get shoes on.   Tetracycline     REACTION:inflammed genitals.    Venlafaxine     REACTION: sweating profusely   Etodolac     REACTION: reaction not known   Macrobid [Nitrofurantoin]     REACTION: ? Lung problem    Amitriptyline Hcl     REACTION: sedating   Atorvastatin     REACTION: muscle twitch and pain   Benicar [Olmesartan Medoxomil]     Muscle pain    Cephalexin Hives and Rash   Cetirizine Hcl     REACTION: headache   Diovan [Valsartan]     Thought it made her feel confused   Naproxen Sodium Other (See Comments)    REACTION: edema of feet and legs.  Not good for ckd   Current Outpatient Medications on File Prior to Visit  Medication Sig Dispense Refill   Acetaminophen (TYLENOL ARTHRITIS PAIN PO) Take 650 mg by mouth daily.     buPROPion (WELLBUTRIN XL) 150 MG 24 hr tablet TAKE 1 TABLET BY MOUTH DAILY 90 tablet 1   Chlorphen-Phenyleph-ASA (ALKA-SELTZER PLUS COLD  PO) Take by mouth as needed. Just for colds     cholecalciferol (VITAMIN D3) 25 MCG (1000 UNIT) tablet Take 1,000 Units by mouth daily.     cyclobenzaprine (FLEXERIL) 5 MG tablet Take 5 mg by mouth daily as needed.     Diclofenac Sodium 1.5 % SOLN Place 2 mLs onto the skin 4 (four) times daily. 3 Bottle 3   DULoxetine (CYMBALTA) 60 MG capsule Take 1 capsule (60 mg total) by mouth daily. 90 capsule 3   esomeprazole (NEXIUM) 20 MG capsule TAKE 1 CAPSULE BY MOUTH DAILY 90 capsule 0   famotidine (PEPCID) 40 MG tablet TAKE 1 TABLET BY MOUTH DAILY 90 tablet 0   ferrous sulfate 325 (65 FE) MG EC tablet Take 325 mg by mouth daily with breakfast.     glucose blood test strip One Touch Ultra stripts blue-To check sugar once daily and as needed for DM2 250.00 100 each 3   GUAIFENESIN CR PO Take by mouth as needed.     HYDROmorphone  HCl (EXALGO) 12 MG T24A SR tablet Take 12 mg by mouth 2 (two) times daily.     hyoscyamine (LEVSIN SL) 0.125 MG SL tablet TAKE 1 TABLET BY MOUTH EVERY 4 HOURS AS NEEDED FOR CRAMPING. 270 tablet 1   ketoconazole (NIZORAL) 2 % shampoo SHAMPOO WITH A SMALL AMOUNT AS DIRECTED THREE TIMES A WEEK SHAMPOO SCALP 3 DAYS A WEEK, LET SHAMPOO SIT FOR 10 MINUTES AND RINSE OFF 120 mL 2   levothyroxine (SYNTHROID) 125 MCG tablet Take 1 tablet (125 mcg total) by mouth daily before breakfast. 90 tablet 2   LYSINE PO Take by mouth as needed. OTC for ulcer prophylaxis     Magnesium 400 MG CAPS Take by mouth daily.     metFORMIN (GLUCOPHAGE-XR) 500 MG 24 hr tablet Take 2 tablets (1,000 mg total) by mouth 2 (two) times daily with a meal. 360 tablet 0   metoprolol succinate (TOPROL-XL) 50 MG 24 hr tablet TAKE 1 TABLET BY MOUTH TWICE  DAILY 180 tablet 0   naloxone (NARCAN) nasal spray 4 mg/0.1 mL      ondansetron (ZOFRAN) 4 MG tablet Take 1 tablet (4 mg total) by mouth every 8 (eight) hours as needed for nausea or vomiting. 20 tablet 0   oxyCODONE (OXYCONTIN) 10 MG 12 hr tablet Take 10 mg by mouth 3 (three) times daily as needed. Beakthrough pain     predniSONE (DELTASONE) 10 MG tablet Take 1 tablet (10 mg total) by mouth daily with breakfast. 30 tablet 2   predniSONE (DELTASONE) 5 MG tablet TAKE 2 TABLETS BY MOUTH EVERY DAY 60 tablet 2   ramipril (ALTACE) 5 MG capsule TAKE 1 CAPSULE BY MOUTH DAILY 90 capsule 0   rosuvastatin (CRESTOR) 5 MG tablet TAKE 1 TABLET BY MOUTH TWICE  WEEKLY 29 tablet 0   tiZANidine (ZANAFLEX) 4 MG tablet Take 4 mg by mouth every 8 (eight) hours as needed for muscle spasms.     Tocilizumab (ACTEMRA ACTPEN) 162 MG/0.9ML SOAJ Inject 162 mg into the skin every 14 (fourteen) days. 5.4 mL 0   vitamin B-12 (CYANOCOBALAMIN) 500 MCG tablet Take 500 mcg by mouth daily.     Xylitol (XYLIMELTS MT) Use as directed in the mouth or throat.     Current Facility-Administered Medications on File Prior to Visit   Medication Dose Route Frequency Provider Last Rate Last Admin   albuterol (PROVENTIL) (2.5 MG/3ML) 0.083% nebulizer solution 2.5 mg  2.5 mg Nebulization Once [place],  Carmin Muskrat, MD        Review of Systems  Constitutional:  Positive for fatigue. Negative for activity change, appetite change, fever and unexpected weight change.  HENT:  Negative for congestion, ear pain, rhinorrhea, sinus pressure and sore throat.   Eyes:  Negative for pain, redness and visual disturbance.  Respiratory:  Negative for cough, shortness of breath and wheezing.   Cardiovascular:  Negative for chest pain and palpitations.  Gastrointestinal:  Negative for abdominal pain, blood in stool, constipation and diarrhea.  Endocrine: Negative for polydipsia and polyuria.  Genitourinary:  Negative for dysuria, frequency and urgency.  Musculoskeletal:  Positive for arthralgias, back pain, myalgias and neck pain.  Skin:  Negative for pallor and rash.  Allergic/Immunologic: Negative for environmental allergies.  Neurological:  Negative for dizziness, syncope and headaches.  Hematological:  Negative for adenopathy. Does not bruise/bleed easily.  Psychiatric/Behavioral:  Negative for decreased concentration and dysphoric mood. The patient is not nervous/anxious.        Objective:   Physical Exam Constitutional:      General: She is not in acute distress.    Appearance: Normal appearance. She is well-developed. She is obese. She is not ill-appearing or diaphoretic.  HENT:     Head: Normocephalic and atraumatic.     Right Ear: Tympanic membrane, ear canal and external ear normal.     Left Ear: Tympanic membrane, ear canal and external ear normal.     Nose: Nose normal. No congestion.     Mouth/Throat:     Mouth: Mucous membranes are moist.     Pharynx: Oropharynx is clear. No posterior oropharyngeal erythema.  Eyes:     General: No scleral icterus.    Extraocular Movements: Extraocular movements intact.      Conjunctiva/sclera: Conjunctivae normal.     Pupils: Pupils are equal, round, and reactive to light.  Neck:     Thyroid: No thyromegaly.     Vascular: No carotid bruit or JVD.  Cardiovascular:     Rate and Rhythm: Normal rate and regular rhythm.     Pulses: Normal pulses.     Heart sounds: Normal heart sounds.     No gallop.  Pulmonary:     Effort: Pulmonary effort is normal. No respiratory distress.     Breath sounds: Normal breath sounds. No wheezing.     Comments: Good air exch Chest:     Chest wall: No tenderness.  Abdominal:     General: Bowel sounds are normal. There is no distension or abdominal bruit.     Palpations: Abdomen is soft. There is no mass.     Tenderness: There is no abdominal tenderness.     Hernia: No hernia is present.  Genitourinary:    Comments: Pt declines breast exam today Musculoskeletal:        General: No tenderness. Normal range of motion.     Cervical back: Normal range of motion and neck supple. No rigidity. No muscular tenderness.     Right lower leg: No edema.     Left lower leg: No edema.     Comments: No kyphosis   Lymphadenopathy:     Cervical: No cervical adenopathy.  Skin:    General: Skin is warm and dry.     Coloration: Skin is not pale.     Findings: No erythema or rash.     Comments: Fair  Few lentigines  2-3 mm area of pale scale on right lower lateral leg   Neurological:  Mental Status: She is alert. Mental status is at baseline.     Cranial Nerves: No cranial nerve deficit.     Motor: No abnormal muscle tone.     Coordination: Coordination normal.     Gait: Gait normal.     Deep Tendon Reflexes: Reflexes are normal and symmetric. Reflexes normal.  Psychiatric:        Mood and Affect: Mood normal.        Cognition and Memory: Cognition and memory normal.     Comments: Mentally sharp overall but frequent word finding            Assessment & Plan:   Problem List Items Addressed This Visit       Cardiovascular  and Mediastinum   Raynaud disease    Reassuring exam Better than usual today Takes a beta blocker  toprol xl 50 mg bid        HTN (hypertension)    bp in fair control at this time  BP Readings from Last 1 Encounters:  09/12/23 134/84   No changes needed Most recent labs reviewed  Disc lifstyle change with low sodium diet and exercise  Plan to continue metoprolol xl 50 mg bid Ramipril 5 mg daily   Pulse per pt is always high at office and fine at home       CAD (coronary artery disease)    Reviewed last cardiology note No clinical changes  Blood pressure controlled  Cholesterol is improved       Aortic atherosclerosis (HCC)    No symptoms Sees cardiology  On twice weekly crestor  Declines zetia  LDL in 80s          Digestive   GERD    Takes both nexium 20 mg daily and pepcid 40 mg daily  Symptoms are controlled  Does not tolerate being off medicines A lot of dietary intolerances also         Endocrine   Hypothyroidism    Lab Results  Component Value Date   TSH 2.51 09/05/2023   Plan to continue levothyroxine 125 mcg daily       Hyperlipidemia associated with type 2 diabetes mellitus (HCC)    Disc goals for lipids and reasons to control them Rev last labs with pt Rev low sat fat diet in detail  LDL is down to 82 -good  Tolerates crestor 5 mg twice weekly  Declined addn of zetia   Has known mild cad and aortic dz       DM type 2 (diabetes mellitus, type 2) (HCC)    Improved Lab Results  Component Value Date   HGBA1C 6.2 09/05/2023    Taking metformin XR 1000 mg bid and tolerating it  Plans to schedule eye appointment Microalb utd  On statin  Eating less junk , commended  Exercise is a challenge Follow up 6 mo         Musculoskeletal and Integument   Skin lesion    Small area of scale on r lower leg itches May be early SK but cannot r/o other process Referral done to dermatology       Relevant Orders   Ambulatory referral to  Dermatology   Rheumatoid arthritis (HCC)    Under care of rheumatolgy for this and fibromyaltia  Taking sctemra  Down to 2 mg of prednisone daily  Also oxycodone and hydromortphone from pain clinic        Osteopenia    Dexa reviewed 04/2023 D  level normal  Discussed fall prevention, supplements and exercise for bone density  Exercise limited by chronic pain  One fall /one fracture since last visit  Needs dental work before considering treatment in future         Other   Vitamin D deficiency    Last vitamin D Lab Results  Component Value Date   25OHVITD2 <1.0 10/24/2017   25OHVITD3 65 10/24/2017   VD25OH 51.10 09/05/2023   Vitamin D level is therapeutic with current supplementation Disc importance of this to bone and overall health       Routine general medical examination at a health care facility - Primary    Reviewed health habits including diet and exercise and skin cancer prevention Reviewed appropriate screening tests for age  Also reviewed health mt list, fam hx and immunization status , as well as social and family history   See HPI Labs reviewed and ordered Plans to inquire about shingrix and RSV vaccine at pharmacy  Mammogram planned this week  Declines colon cancer screening  Discussed fall prevention, supplements and exercise for bone density   PHQ 0   Health Maintenance  Topic Date Due   Zoster (Shingles) Vaccine (1 of 2) 02/06/1969   Cologuard (Stool DNA test)  09/04/2019   Eye exam for diabetics  03/05/2023   Complete foot exam   03/24/2023   COVID-19 Vaccine (4 - 2023-24 season) 06/19/2023   Mammogram  07/10/2023   Hemoglobin A1C  03/04/2024   Medicare Annual Wellness Visit  05/09/2024   DTaP/Tdap/Td vaccine (3 - Td or Tdap) 06/14/2024   Yearly kidney function blood test for diabetes  09/04/2024   Yearly kidney health urinalysis for diabetes  09/04/2024   DEXA scan (bone density measurement)  04/18/2025   Pneumonia Vaccine  Completed   Flu  Shot  Completed   Hepatitis C Screening  Completed   HPV Vaccine  Aged Out   Colon Cancer Screening  Discontinued         Obesity    Discussed how this problem influences overall health and the risks it imposes  Reviewed plan for weight loss with lower calorie diet (via better food choices (lower glycemic and portion control) along with exercise building up to or more than 30 minutes 5 days per week including some aerobic activity and strength training    Unsure if she would be a candidate for glp-1 med in future in light of many GI issues and food intolerances       Depression with anxiety    Doing well with current medicines  Wellbutrin xl 150 mg daily  Cymbalta 60 mg daily   Tolerates well  In setting of chronic pain  Encouraged physical activity when able   PHQ 0

## 2023-09-13 ENCOUNTER — Other Ambulatory Visit: Payer: Self-pay | Admitting: *Deleted

## 2023-09-13 ENCOUNTER — Encounter: Payer: Self-pay | Admitting: Rheumatology

## 2023-09-13 ENCOUNTER — Ambulatory Visit: Payer: Medicare Other | Attending: Rheumatology | Admitting: Rheumatology

## 2023-09-13 VITALS — BP 114/77 | HR 88 | Resp 14 | Ht 60.0 in | Wt 178.0 lb

## 2023-09-13 DIAGNOSIS — M5030 Other cervical disc degeneration, unspecified cervical region: Secondary | ICD-10-CM | POA: Diagnosis not present

## 2023-09-13 DIAGNOSIS — I7300 Raynaud's syndrome without gangrene: Secondary | ICD-10-CM

## 2023-09-13 DIAGNOSIS — G894 Chronic pain syndrome: Secondary | ICD-10-CM | POA: Diagnosis not present

## 2023-09-13 DIAGNOSIS — Z79891 Long term (current) use of opiate analgesic: Secondary | ICD-10-CM

## 2023-09-13 DIAGNOSIS — J849 Interstitial pulmonary disease, unspecified: Secondary | ICD-10-CM

## 2023-09-13 DIAGNOSIS — M5134 Other intervertebral disc degeneration, thoracic region: Secondary | ICD-10-CM

## 2023-09-13 DIAGNOSIS — G8929 Other chronic pain: Secondary | ICD-10-CM

## 2023-09-13 DIAGNOSIS — M47816 Spondylosis without myelopathy or radiculopathy, lumbar region: Secondary | ICD-10-CM | POA: Diagnosis not present

## 2023-09-13 DIAGNOSIS — F418 Other specified anxiety disorders: Secondary | ICD-10-CM

## 2023-09-13 DIAGNOSIS — E119 Type 2 diabetes mellitus without complications: Secondary | ICD-10-CM

## 2023-09-13 DIAGNOSIS — M170 Bilateral primary osteoarthritis of knee: Secondary | ICD-10-CM | POA: Diagnosis not present

## 2023-09-13 DIAGNOSIS — Z8774 Personal history of (corrected) congenital malformations of heart and circulatory system: Secondary | ICD-10-CM

## 2023-09-13 DIAGNOSIS — M0609 Rheumatoid arthritis without rheumatoid factor, multiple sites: Secondary | ICD-10-CM

## 2023-09-13 DIAGNOSIS — M25512 Pain in left shoulder: Secondary | ICD-10-CM

## 2023-09-13 DIAGNOSIS — I059 Rheumatic mitral valve disease, unspecified: Secondary | ICD-10-CM

## 2023-09-13 DIAGNOSIS — M797 Fibromyalgia: Secondary | ICD-10-CM | POA: Diagnosis not present

## 2023-09-13 DIAGNOSIS — E785 Hyperlipidemia, unspecified: Secondary | ICD-10-CM

## 2023-09-13 DIAGNOSIS — M8589 Other specified disorders of bone density and structure, multiple sites: Secondary | ICD-10-CM

## 2023-09-13 DIAGNOSIS — I83029 Varicose veins of left lower extremity with ulcer of unspecified site: Secondary | ICD-10-CM

## 2023-09-13 DIAGNOSIS — E559 Vitamin D deficiency, unspecified: Secondary | ICD-10-CM

## 2023-09-13 DIAGNOSIS — L97929 Non-pressure chronic ulcer of unspecified part of left lower leg with unspecified severity: Secondary | ICD-10-CM

## 2023-09-13 DIAGNOSIS — Z981 Arthrodesis status: Secondary | ICD-10-CM

## 2023-09-13 DIAGNOSIS — Z8781 Personal history of (healed) traumatic fracture: Secondary | ICD-10-CM

## 2023-09-13 DIAGNOSIS — Z79899 Other long term (current) drug therapy: Secondary | ICD-10-CM | POA: Diagnosis not present

## 2023-09-13 DIAGNOSIS — N1831 Chronic kidney disease, stage 3a: Secondary | ICD-10-CM

## 2023-09-13 DIAGNOSIS — Z8639 Personal history of other endocrine, nutritional and metabolic disease: Secondary | ICD-10-CM

## 2023-09-13 DIAGNOSIS — I10 Essential (primary) hypertension: Secondary | ICD-10-CM

## 2023-09-13 DIAGNOSIS — E1169 Type 2 diabetes mellitus with other specified complication: Secondary | ICD-10-CM

## 2023-09-13 DIAGNOSIS — Z8719 Personal history of other diseases of the digestive system: Secondary | ICD-10-CM

## 2023-09-13 MED ORDER — PREDNISONE 1 MG PO TABS: 4.0000 mg | ORAL_TABLET | Freq: Every day | ORAL | 0 refills | Status: DC

## 2023-09-13 NOTE — Progress Notes (Signed)
Office Visit Note  Patient: Tina Berger             Date of Birth: January 03, 1951           MRN: [id number]             PCP: Judy Pimple, MD Referring: Tower, [name], MD Visit Date: 09/13/2023 Occupation: @GUAROCC @  Subjective:  Pain in multiple joints   History of Present Illness: Tina Berger is a 72 y.o. female with seronegative rheumatoid arthritis, interstitial lung disease and osteoarthritis.  She returns today after her last visit in June 2024.  Patient states she had a fall in April 2024 for which she was seen by an orthopedic surgeon at the time she was told that she had trigger finger.  She states she did not require any surgery and she still has some intermittent triggering.  She states for the last couple of weeks she has been having increased pain and discomfort in her bilateral hands,, both shoulders and feet.  She continues to take Actemra 162 mg subcu every 14 days.  She continues to be on prednisone 10 mg p.o. daily.  He denies any shortness of breath.  Raynauds is currently not very bothersome.  Although she does have more symptoms during the colder weather.  She continues to have discomfort in her neck and lower back intermittently.  She also has generalized pain from fibromyalgia.  She continues to be on Cymbalta 60 mg daily.  Continues to go to the pain management.  Her bone density on April 19, 2023 showed a T-score of -2.1 in the right femur with BMD 0.745.    Activities of Daily Living:  Patient reports morning stiffness for 2 hours.   Patient Reports nocturnal pain.  Difficulty dressing/grooming: Reports Difficulty climbing stairs: Reports Difficulty getting out of chair: Reports Difficulty using hands for taps, buttons, cutlery, and/or writing: Reports  Review of Systems  Constitutional:  Positive for fatigue.  HENT:  Positive for mouth sores and mouth dryness.   Eyes:  Positive for dryness.  Respiratory:  Negative for shortness of breath.    Cardiovascular:  Negative for chest pain and palpitations.  Gastrointestinal:  Positive for constipation and diarrhea. Negative for blood in stool.  Endocrine: Negative for increased urination.  Genitourinary:  Negative for involuntary urination.  Musculoskeletal:  Positive for joint pain, gait problem, joint pain, joint swelling, myalgias, muscle weakness, morning stiffness, muscle tenderness and myalgias.  Skin:  Positive for sensitivity to sunlight. Negative for color change, rash and hair loss.  Allergic/Immunologic: Negative for susceptible to infections.  Neurological:  Positive for dizziness and headaches.  Hematological:  Negative for swollen glands.  Psychiatric/Behavioral:  Negative for depressed mood and sleep disturbance. The patient is not nervous/anxious.     PMFS History:  Patient Active Problem List   Diagnosis Date Noted   Aortic atherosclerosis (HCC) 09/12/2023   Skin lesion 09/12/2023   Frequent falls 12/29/2022   Dysuria 09/07/2021   Rheumatoid arthritis (HCC) 05/27/2021   Chronic venous insufficiency 05/23/2021   AVM (arteriovenous malformation) 04/04/2021   CAD (coronary artery disease) 01/22/2021   Primary osteoarthritis of both knees 01/12/2021   Positive ANA (antinuclear antibody) 11/17/2020   Joint pain 11/13/2020   Current use of proton pump inhibitor 08/26/2020   Estrogen deficiency 08/21/2018   TMJ (dislocation of temporomandibular joint), initial encounter 05/05/2018   Anterior neck pain 05/05/2018   Chronic pain syndrome 11/09/2017   Chronic upper extremity pain [facility] Area  of Pain) (Bilateral) (L>R) 11/09/2017   Fibromyalgia syndrome 11/09/2017   Osteoarthritis 11/09/2017   Osteoarthritis of lumbar facet joint (Bilateral) 11/09/2017   Lumbar facet arthropathy (Bilateral) 11/09/2017   Lumbar facet syndrome (Bilateral) (L>R) 11/09/2017   Lumbar foraminal stenosis (multilevel) (Bilateral) 11/09/2017   DDD (degenerative disc disease), lumbar  11/09/2017   Thoracic levoscoliosis 11/09/2017   Thoracic facet syndrome (Bilateral) (L>R) 11/09/2017   Thoracic facet arthropathy (Bilateral) (R>L) 11/09/2017   DDD (degenerative disc disease), thoracic 11/09/2017   Thoracolumbar IVDD 11/09/2017   DDD (degenerative disc disease), cervical 11/09/2017   Osteoarthritis of cervical facet (Bilateral) 11/09/2017   Grade 1 Anterolisthesis of C7 over T1 11/09/2017   Cervical foraminal stenosis (Bilateral) 11/09/2017   History of fusion of cervical spine (C5-6 ACDF) 11/09/2017   Cervical facet syndrome (Bilateral) 11/09/2017   Disorder of skeletal system 11/09/2017   Cervical radiculitis (Bilateral) 11/09/2017   Lumbar Epidural lipomatosis 11/09/2017   Chronic sacroiliac joint pain (Bilateral) (L>R) 11/09/2017   Chronic hip pain (Bilateral) (L>R) 11/09/2017   Chronic upper back pain (Primary Area of Pain) (midline) 10/24/2017   Chronic neck pain (Secondary Area of Pain) (Bilateral)  (L>R) 10/24/2017   Chronic low back pain (Fourth Area of Pain) (Bilateral) (L>R) 10/24/2017   Chronic lower extremity pain (Fifth Area of Pain) (Bilateral) (L>R) 10/24/2017   Lumbar Grade 1 Retrolisthesis of L1-2 and L2-3 10/24/2017   Other long term (current) drug therapy 10/24/2017   Other specified health status 10/24/2017   Long term current use of opiate analgesic 10/24/2017   Long term prescription opiate use 10/24/2017   Opiate use 10/24/2017   DM type 2 (diabetes mellitus, type 2) (HCC) 06/14/2014   Pharmacologic therapy 06/14/2014   Pedal edema    Post-menopausal 06/23/2012   Lumbar disc disease with radiculopathy 11/18/2011   HTN (hypertension) 07/30/2011   Raynaud disease 07/30/2011   Obesity 07/30/2011   Routine general medical examination at a health care facility 03/28/2011   Palpitations 04/27/2010   Depression with anxiety 06/14/2008   Vitamin D deficiency 05/13/2008   Osteopenia 03/25/2008   Hypothyroidism 03/28/2007   Hyperlipidemia  associated with type 2 diabetes mellitus (HCC) 03/28/2007   Other iron deficiency anemias 03/28/2007   PANIC ATTACK 03/28/2007   KERATOCONJUNCTIVITIS SICCA 03/28/2007   Mitral valve disorder 03/28/2007   GERD 03/28/2007   IBS 03/28/2007   Rosacea 03/28/2007   PLANTAR FASCIITIS 03/28/2007   MIGRAINES, HX OF 03/28/2007    Past Medical History:  Diagnosis Date   Acute renal insufficiency 05/31/2014   pt not aware of this diagnosis   Anemia, iron deficiency    Anxiety    Bilateral lower extremity edema    a. uses torsemide   Broken wrist    did not require surgery   Cervical dysplasia    abnormal paps   Colon cancer screening 06/14/2014   Coronary artery disease    Cough 08/08/2012   Degenerative disk disease 11/19/2011   Depression    Diabetes mellitus type II    Diverticulosis    DJD (degenerative joint disease)    Drug rash 05/22/2011   Dry eyes    Dysrhythmia    metoprolol.   Edema    Elevated liver enzymes 03/21/2012   Encounter for routine gynecological examination 06/14/2014   ESOPHAGITIS 03/28/2007   Qualifier: Hospitalized for  By: Lindwood Qua CMA, Jerl Santos     Fibromyalgia    GASTRITIS 03/28/2007   Qualifier: History of  By: Lindwood Qua CMA, Jerl Santos  GERD (gastroesophageal reflux disease)    Hemorrhoids    external   HLD (hyperlipidemia)    HNP (herniated nucleus pulposus) 11/1997   T6,7,8 with DJD   HTN (hypertension)    Hyperglycemia 05/13/2008   Qualifier: Diagnosis of  By: Milinda Antis MD, Colon Flattery    Hypothyroidism    Interstitial lung disease (HCC)    pt not aware of this   Left ovarian cyst    x 3, rupture   Osteoarthritis    hands   Osteopenia    mild-11/01; improved 12/05   Other screening mammogram 08/18/2011   Palpitations    PERSONAL HISTORY ALLERGY UNSPEC MEDICINAL AGENT 03/18/2010   Qualifier: Diagnosis of  By: Milinda Antis MD, Colon Flattery    PONV (postoperative nausea and vomiting)    Raynaud's disease    Recurrent HSV (herpes simplex  virus)    lesions in nose or mouth with frequent ulcers. d/t meds and dry mouth   Rheumatoid arthritis (HCC)    Rhinitis 03/21/2012   Routine general medical examination at a health care facility 03/28/2011    Family History  Problem Relation Age of Onset   Lung cancer Mother        + smoker   Coronary artery disease Mother        relatively young   Emphysema Father        + smoker   Lymphoma Sister    Heart disease Brother    Lymphoma Brother    Parkinson's disease Brother    Anemia Brother        aplastic    Diabetes Brother    Iron deficiency Daughter    Leukemia Son    Breast cancer Neg Hx    Past Surgical History:  Procedure Laterality Date   ABDOMINAL EXPLORATION SURGERY     ACDF  1991   BIOPSY OF SKIN SUBCUTANEOUS TISSUE AND/OR MUCOUS MEMBRANE Left 04/08/2021   Procedure: BIOPSY OF SKIN SUBCUTANEOUS TISSUE AND/OR MUCOUS MEMBRANE ( REMOVAL SKIN NODULE);  Surgeon: Renford Dills, MD;  Location: [facility] ORS;  Service: Vascular;  Laterality: Left;   COLONOSCOPY  08/2000   Diverticulosis; hemorrhoids   HAND SURGERY Left    left thumb. 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   Lives with husband. Feels safe in her home.   Immunization History  Administered Date(s) Administered   Fluad Quad(high Dose 65+) 08/07/2020, 07/13/2022   Fluad Trivalent(High Dose 65+) 06/27/2023   Influenza Split 06/19/2011   Influenza Whole 08/01/2009   Influenza, High Dose Seasonal PF 08/14/2018, 07/03/2019   Influenza,inj,Quad PF,6+ Mos 06/14/2014, 08/25/2015, 07/26/2016, 08/19/2017   Moderna Sars-Covid-2 Vaccination 09/17/2020   PFIZER(Purple Top)SARS-COV-2 Vaccination 11/27/2019, 12/18/2019   Pneumococcal Conjugate-13 08/25/2015   Pneumococcal Polysaccharide-23 08/25/2016   Td 11/25/2003   Tdap 06/14/2014   Zoster, Live 09/16/2014      Objective: Vital Signs: BP 114/77 (BP Location: Left Arm, Patient Position: Sitting, Cuff Size: Normal)   Pulse 88   Resp 14   Ht 5' (1.524 m)   Wt 178 lb (80.7 kg)   BMI 34.76 kg/m    Physical Exam Vitals and nursing note reviewed.  Constitutional:      Appearance: She is well-developed.  HENT:     Head: Normocephalic and atraumatic.  Eyes:     Conjunctiva/sclera: Conjunctivae normal.  Cardiovascular:  Rate and Rhythm: Normal rate and regular rhythm.     Heart sounds: Normal heart sounds.  Pulmonary:     Effort: Pulmonary effort is normal.     Breath sounds: Normal breath sounds.  Abdominal:     General: Bowel sounds are normal.     Palpations: Abdomen is soft.  Musculoskeletal:     Cervical back: Normal range of motion.  Lymphadenopathy:     Cervical: No cervical adenopathy.  Skin:    General: Skin is warm and dry.     Capillary Refill: Capillary refill takes less than 2 seconds.  Neurological:     Mental Status: She is alert and oriented to person, place, and time.  Psychiatric:        Behavior: Behavior normal.      Musculoskeletal Exam: Patient had limited range of motion cervical spine.  Shoulder joints were and painful range of motion motion with abduction up to 100 degrees and limited internal rotation.  Elbow joints and wrist joints with good range of motion.  She synovial thickening over MCP joints with no active synovitis.  Hip joints and knee joints with good range of motion.  There was no tenderness over ankles or MTPs.  CDAI Exam: CDAI Score: 10  Patient Global: 30 / 100; Provider Global: 30 / 100 Swollen: 0 ; Tender: 4  Joint Exam 09/13/2023      Right  Left  Glenohumeral   Tender   Tender  MCP 2   Tender   Tender     Investigation: No additional findings.  Imaging: No results found.  Recent Labs: Lab Results  Component Value Date   WBC 6.0 09/05/2023   HGB 13.2 09/05/2023   PLT 323.0 09/05/2023   NA 140 09/05/2023   K 4.9  09/05/2023   CL 99 09/05/2023   CO2 32 09/05/2023   GLUCOSE 146 (H) 09/05/2023   BUN 16 09/05/2023   CREATININE 0.86 09/05/2023   BILITOT 0.4 09/05/2023   ALKPHOS 48 09/05/2023   AST 15 09/05/2023   ALT 12 09/05/2023   PROT 6.5 09/05/2023   ALBUMIN 4.0 09/05/2023   CALCIUM 8.8 09/05/2023   GFRAA 75 03/24/2021   QFTBGOLDPLUS NEGATIVE 03/22/2023    Speciality Comments: Arava- mood disorder  Procedures:  No procedures performed Allergies: Ceftin [cefuroxime], Clonidine derivatives, Erythromycin, Keflex [cephalexin], Penicillins, Sulfonamide derivatives, Ciprofloxacin, Fluoxetine hcl, Furosemide, Gabapentin, Hydroxychloroquine, Paroxetine, Pregabalin, Tetracycline, Venlafaxine, Etodolac, Macrobid [nitrofurantoin], Amitriptyline hcl, Atorvastatin, Benicar [olmesartan medoxomil], Cephalexin, Cetirizine hcl, Diovan [valsartan], and Naproxen sodium   Assessment / Plan:     Visit Diagnoses: Rheumatoid arthritis of multiple sites with negative rheumatoid factor (HCC)-patient complains of pain and discomfort in multiple joints.  She gives history of increased pain in her shoulders, her hands, feet.  No synovitis was noted.  Synovial thickening was noted over MCP joints as described above.  She had painful range of motion of her shoulder joints.  She has been on Actemra and prednisone combination.  I discussed tapering prednisone by 1 mg every month.  Patient was in agreement.  I will send a prescription for prednisone of 1 mg tablet.  ILD (interstitial lung disease) (HCC) - Evaluated by Dr. Marchelle Gearing on 03/01/2023: Very mild ILD: based on symptoms, walk test, pulmonary function test and CT scan.  Patient has an appointment coming up with Dr. Colletta Maryland after high-resolution CT.  High risk medication use - Actemra 162 mg subcutaneous injections every 14 days.  Prednisone 10 mg daily.  Patient will start tapering  prednisone by 1 mg every month.  Labs obtained in November CBC and CMP were reviewed which  are within normal limits.  She was advised to get labs every 3 months.  Information for immunization was given.  She was advised to hold Actemra if she develops an infection and resume after the infection resolves.  Raynaud's disease without gangrene-she has mild symptoms.  Chronic left shoulder pain-she could history of pain and discomfort in the bilateral shoulders.  Primary osteoarthritis of both knees-she continues to have discomfort in her knee joints.  No warmth swelling or effusion was noted.  DDD (degenerative disc disease), cervical-she has a range of motion of the cervical spine.  History of fusion of cervical spine (C5-6 ACDF)  DDD (degenerative disc disease), thoracic - Thoracic kyphosis noted.  Spondylosis of lumbar spine-she has limited range of motion of the lumbar spine.  Fibromyalgia syndrome -she has general lysed pain and discomfort from fibromyalgia.  She is on Cymbalta 60 mg daily.  Chronic pain syndrome -patient goes to pain management.  She states she has been tapering medications.  Patient is taking hydromorphone and oxycodone for pain relief.   Long term current use of opiate analgesic - Followed by pain management-taking hydromorphone twice daily and oxycodone for breakthrough pain.  Osteopenia of multiple sites - DEXA April 19, 2023 T-score -2.1 right total femur, BMD 0.745.  Patient is planning to discuss treatment with her PCP per patient.  Use of calcium rich diet and vitamin D was advised.  She would benefit from the use of Fosamax or Actonel.  History of vertebral fracture - No kyphoplasty performed.  History of wrist fracture - Left-fall 02/13/23.  Vitamin D deficiency  Other medical problems are listed as follows:  Chronic renal impairment, stage 3a (HCC)  Type 2 diabetes mellitus without complication, without long-term current use of insulin (HCC)  Depression with anxiety  History of IBS  History of hypothyroidism  Primary hypertension-blood  pressure was normal today.  Hx of arteriovenous malformation (AVM)  Mitral valve disorder  History of gastroesophageal reflux (GERD)  Venous ulcer of left leg (HCC)  Hyperlipidemia associated with type 2 diabetes mellitus (HCC)  Orders: No orders of the defined types were placed in this encounter.  No orders of the defined types were placed in this encounter.    Follow-Up Instructions: Return in about 3 months (around 12/14/2023) for Rheumatoid arthritis.   Pollyann Savoy, MD  Note - This record has been created using Animal nutritionist.  Chart creation errors have been sought, but may not always  have been located. Such creation errors do not reflect on  the standard of medical care.

## 2023-09-13 NOTE — Patient Instructions (Signed)
Please reduce prednisone by 1 mg every month until you reach prednisone 5 mg daily   Standing Labs We placed an order today for your standing lab work.   Please have your standing labs drawn in February and every 3 months  Please have your labs drawn 2 weeks prior to your appointment so that the provider can discuss your lab results at your appointment, if possible.  Please note that you may see your imaging and lab results in MyChart before we have reviewed them. We will contact you once all results are reviewed. Please allow our office up to 72 hours to thoroughly review all of the results before contacting the office for clarification of your results.  WALK-IN LAB HOURS  Monday through Thursday from 8:00 am -12:30 pm and 1:00 pm-5:00 pm and Friday from 8:00 am-12:00 pm.  Patients with office visits requiring labs will be seen before walk-in labs.  You may encounter longer than normal wait times. Please allow additional time. Wait times may be shorter on  Monday and Thursday afternoons.  We do not book appointments for walk-in labs. We appreciate your patience and understanding with our staff.   Labs are drawn by [facility]. Please bring your co-pay at the time of your lab draw.  You may receive a bill from [facility] for your lab work.  Please note if you are on Hydroxychloroquine and and an order has been placed for a Hydroxychloroquine level,  you will need to have it drawn 4 hours or more after your last dose.  If you wish to have your labs drawn at another location, please call the office 24 hours in advance so we can fax the orders.  The office is located at [address], [place], [place] [address]   If you have any questions regarding directions or hours of operation,  please call [phone].   As a reminder, please drink plenty of water prior to coming for your lab work. Thanks!   Vaccines You are taking a medication(s) that can suppress your immune system.  The  following immunizations are recommended: Flu annually Covid-19  RSV Td/Tdap (tetanus, diphtheria, pertussis) every 10 years Pneumonia (Prevnar 15 then Pneumovax 23 at least 1 year apart.  Alternatively, can take Prevnar 20 without needing additional dose) Shingrix: 2 doses from 4 weeks to 6 months apart  Please check with your PCP to make sure you are up to date.  If you have signs or symptoms of an infection or start antibiotics: First, call your PCP for workup of your infection. Hold your medication through the infection, until you complete your antibiotics, and until symptoms resolve if you take the following: Injectable medication (Actemra, Benlysta, Cimzia, Cosentyx, Enbrel, Humira, Kevzara, Orencia, Remicade, Simponi, Stelara, Taltz, Tremfya) Methotrexate Leflunomide (Arava) Mycophenolate (Cellcept) Harriette Ohara, Olumiant, or Rinvoq

## 2023-09-14 ENCOUNTER — Other Ambulatory Visit: Payer: Self-pay | Admitting: Family Medicine

## 2023-09-14 ENCOUNTER — Ambulatory Visit
Admission: RE | Admit: 2023-09-14 | Discharge: 2023-09-14 | Disposition: A | Discharge status: Home / Self Care | Payer: Medicare Other | Source: Ambulatory Visit | Attending: Family Medicine | Admitting: Family Medicine

## 2023-09-14 DIAGNOSIS — Z1231 Encounter for screening mammogram for malignant neoplasm of breast: Secondary | ICD-10-CM | POA: Diagnosis not present

## 2023-09-22 ENCOUNTER — Other Ambulatory Visit: Payer: Self-pay | Admitting: Family Medicine

## 2023-10-13 ENCOUNTER — Other Ambulatory Visit: Payer: Self-pay | Admitting: Rheumatology

## 2023-10-13 NOTE — Telephone Encounter (Addendum)
Last Fill: 09/13/2023  Next Visit: 12/14/2023  Last Visit: 09/13/2023  Dx: Rheumatoid arthritis of multiple sites with negative rheumatoid factor   Current Dose per office note on 09/13/2023: Prednisone 10 mg daily. Patient will start tapering prednisone by 1 mg every month.   Spoke with patient and she is currently on 9 mg of Prednisone until 10/18/2023. She states she will start on 8 mg as of 10/19/2023.   Okay to refill Prednisone?

## 2023-10-20 DIAGNOSIS — G894 Chronic pain syndrome: Secondary | ICD-10-CM | POA: Diagnosis not present

## 2023-10-20 DIAGNOSIS — M961 Postlaminectomy syndrome, not elsewhere classified: Secondary | ICD-10-CM | POA: Diagnosis not present

## 2023-10-20 DIAGNOSIS — K5903 Drug induced constipation: Secondary | ICD-10-CM | POA: Diagnosis not present

## 2023-10-20 DIAGNOSIS — M5451 Vertebrogenic low back pain: Secondary | ICD-10-CM | POA: Diagnosis not present

## 2023-10-20 DIAGNOSIS — Z79891 Long term (current) use of opiate analgesic: Secondary | ICD-10-CM | POA: Diagnosis not present

## 2023-10-20 DIAGNOSIS — M5134 Other intervertebral disc degeneration, thoracic region: Secondary | ICD-10-CM | POA: Diagnosis not present

## 2023-10-20 DIAGNOSIS — M4725 Other spondylosis with radiculopathy, thoracolumbar region: Secondary | ICD-10-CM | POA: Diagnosis not present

## 2023-10-25 ENCOUNTER — Other Ambulatory Visit: Payer: Self-pay | Admitting: Family Medicine

## 2023-10-25 NOTE — Telephone Encounter (Signed)
 Last filled on 07/18/23 # 270 tabs/ 1 refill   CPE 09/12/23

## 2023-10-25 NOTE — Telephone Encounter (Signed)
 How many tablets does she take average daily to estimate how much she needs for 90 day supply

## 2023-10-26 ENCOUNTER — Other Ambulatory Visit: Payer: Self-pay | Admitting: Family Medicine

## 2023-10-27 NOTE — Telephone Encounter (Signed)
 Please decline this and change directions (for future) to 1 po bid prn  Thanks

## 2023-10-27 NOTE — Telephone Encounter (Signed)
 Spoke with pt, she states that she takes 1 tablet daily but does not need a refill at this time.

## 2023-10-27 NOTE — Telephone Encounter (Signed)
 Left VM requesting pt to call the office back

## 2023-10-28 ENCOUNTER — Other Ambulatory Visit: Payer: Self-pay

## 2023-10-28 ENCOUNTER — Other Ambulatory Visit: Payer: Self-pay | Admitting: Family Medicine

## 2023-10-28 MED ORDER — HYOSCYAMINE SULFATE 0.125 MG SL SUBL: 0.1250 mg | SUBLINGUAL_TABLET | Freq: Two times a day (BID) | SUBLINGUAL | Status: AC | PRN

## 2023-10-28 NOTE — Progress Notes (Signed)
 Previous rx denied per Dr. Milinda Antis and directions have been updated.

## 2023-11-09 ENCOUNTER — Other Ambulatory Visit: Payer: Self-pay | Admitting: Physician Assistant

## 2023-11-10 NOTE — Telephone Encounter (Signed)
Last Fill: 10/13/2023 (1 mg), 07/12/2023 (5 mg)   Next Visit: 12/14/2023   Last Visit: 09/13/2023   Dx: Rheumatoid arthritis of multiple sites with negative rheumatoid factor    Current Dose per office note on 09/13/2023: Prednisone 10 mg daily. Patient will start tapering prednisone by 1 mg every month.    Spoke with patient and she is currently on 8 mg of Prednisone until 11/18/2023. She states she will start on 7 mg on 11/19/2023.   Okay to refill Prednisone?

## 2023-11-10 NOTE — Telephone Encounter (Signed)
So she is only needing prednisone 5 mg 1 tablets 1 tablet daily and then 2 tablets of the 1  mg daily? Please change 5 mg tablet prescription accordingly

## 2023-11-27 ENCOUNTER — Other Ambulatory Visit: Payer: Self-pay | Admitting: Family Medicine

## 2023-11-28 ENCOUNTER — Other Ambulatory Visit: Payer: Self-pay | Admitting: Family Medicine

## 2023-11-28 DIAGNOSIS — E119 Type 2 diabetes mellitus without complications: Secondary | ICD-10-CM

## 2023-11-30 NOTE — Progress Notes (Unsigned)
 Office Visit Note  Patient: Tina Berger             Date of Birth: 26-Apr-1951           MRN: [id number]             PCP: Judy Pimple, MD Referring: Tower, [name], MD Visit Date: 12/14/2023 Occupation: @GUAROCC @  Subjective:  Right eye redness   History of Present Illness: Tina Berger is a 73 y.o. female with history of seronegative rheumatoid arthritis and ILD.  Patient remains on Actemra 162 mg subcutaneous injections every 14 days.  She has not had any recent gaps in therapy.  Patient reports that she has been able to taper prednisone from 10 mg down to 7 mg daily.  She has been on 7 mg daily for the month of February.  Patient states that since reducing the dose of prednisone she has noticed increased fatigue.  She states that over the past few days she has also been experiencing increased pain in both shoulders, right wrist, and both hands.  Patient states since Monday she has also had redness and irritation in her right eye.  Patient reports that she woke up Monday morning scratching her eye with a blanket and states that yesterday her symptoms worsened.  She woke up this morning with crusting of her right eye.  She will be seeing ophthalmology this afternoon at 330 for further evaluation. Patient states that she is scheduled to have an updated high-resolution chest CT on 12/27/2023.  She is scheduled to see Dr. Marchelle Gearing again on 01/17/24.     Activities of Daily Living:  Patient reports morning stiffness for several hours.   Patient Reports nocturnal pain.  Difficulty dressing/grooming: Denies Difficulty climbing stairs: Denies Difficulty getting out of chair: Denies Difficulty using hands for taps, buttons, cutlery, and/or writing: Reports  Review of Systems  Constitutional:  Positive for fatigue.  HENT:  Positive for mouth dryness and nose dryness. Negative for mouth sores.   Eyes:  Positive for pain, redness and dryness.  Respiratory:  Negative for shortness of breath  and difficulty breathing.   Cardiovascular:  Negative for chest pain and palpitations.  Gastrointestinal:  Positive for constipation. Negative for blood in stool and diarrhea.  Endocrine: Negative for increased urination.  Genitourinary:  Negative for involuntary urination.  Musculoskeletal:  Positive for joint pain, gait problem, joint pain, joint swelling, myalgias, muscle weakness, morning stiffness, muscle tenderness and myalgias.  Skin:  Positive for color change. Negative for rash, hair loss and sensitivity to sunlight.  Allergic/Immunologic: Negative for susceptible to infections.  Neurological:  Positive for dizziness and headaches.  Hematological:  Negative for swollen glands.  Psychiatric/Behavioral:  Negative for depressed mood and sleep disturbance. The patient is not nervous/anxious.     PMFS History:  Patient Active Problem List   Diagnosis Date Noted   Aortic atherosclerosis (HCC) 09/12/2023   Skin lesion 09/12/2023   Frequent falls 12/29/2022   Dysuria 09/07/2021   Rheumatoid arthritis (HCC) 05/27/2021   Chronic venous insufficiency 05/23/2021   AVM (arteriovenous malformation) 04/04/2021   CAD (coronary artery disease) 01/22/2021   Primary osteoarthritis of both knees 01/12/2021   Positive ANA (antinuclear antibody) 11/17/2020   Joint pain 11/13/2020   Current use of proton pump inhibitor 08/26/2020   Estrogen deficiency 08/21/2018   TMJ (dislocation of temporomandibular joint), initial encounter 05/05/2018   Anterior neck pain 05/05/2018   Chronic pain syndrome 11/09/2017   Chronic upper extremity pain (  Tertiary Area of Pain) (Bilateral) (L>R) 11/09/2017   Fibromyalgia syndrome 11/09/2017   Osteoarthritis 11/09/2017   Osteoarthritis of lumbar facet joint (Bilateral) 11/09/2017   Lumbar facet arthropathy (Bilateral) 11/09/2017   Lumbar facet syndrome (Bilateral) (L>R) 11/09/2017   Lumbar foraminal stenosis (multilevel) (Bilateral) 11/09/2017   DDD (degenerative  disc disease), lumbar 11/09/2017   Thoracic levoscoliosis 11/09/2017   Thoracic facet syndrome (Bilateral) (L>R) 11/09/2017   Thoracic facet arthropathy (Bilateral) (R>L) 11/09/2017   DDD (degenerative disc disease), thoracic 11/09/2017   Thoracolumbar IVDD 11/09/2017   DDD (degenerative disc disease), cervical 11/09/2017   Osteoarthritis of cervical facet (Bilateral) 11/09/2017   Grade 1 Anterolisthesis of C7 over T1 11/09/2017   Cervical foraminal stenosis (Bilateral) 11/09/2017   History of fusion of cervical spine (C5-6 ACDF) 11/09/2017   Cervical facet syndrome (Bilateral) 11/09/2017   Disorder of skeletal system 11/09/2017   Cervical radiculitis (Bilateral) 11/09/2017   Lumbar Epidural lipomatosis 11/09/2017   Chronic sacroiliac joint pain (Bilateral) (L>R) 11/09/2017   Chronic hip pain (Bilateral) (L>R) 11/09/2017   Chronic upper back pain (Primary Area of Pain) (midline) 10/24/2017   Chronic neck pain (Secondary Area of Pain) (Bilateral)  (L>R) 10/24/2017   Chronic low back pain (Fourth Area of Pain) (Bilateral) (L>R) 10/24/2017   Chronic lower extremity pain (Fifth Area of Pain) (Bilateral) (L>R) 10/24/2017   Lumbar Grade 1 Retrolisthesis of L1-2 and L2-3 10/24/2017   Other long term (current) drug therapy 10/24/2017   Other specified health status 10/24/2017   Long term current use of opiate analgesic 10/24/2017   Long term prescription opiate use 10/24/2017   Opiate use 10/24/2017   DM type 2 (diabetes mellitus, type 2) (HCC) 06/14/2014   Pharmacologic therapy 06/14/2014   Pedal edema    Post-menopausal 06/23/2012   Lumbar disc disease with radiculopathy 11/18/2011   HTN (hypertension) 07/30/2011   Raynaud disease 07/30/2011   Obesity 07/30/2011   Routine general medical examination at a health care facility 03/28/2011   Palpitations 04/27/2010   Depression with anxiety 06/14/2008   Vitamin D deficiency 05/13/2008   Osteopenia 03/25/2008   Hypothyroidism 03/28/2007    Hyperlipidemia associated with type 2 diabetes mellitus (HCC) 03/28/2007   Other iron deficiency anemias 03/28/2007   PANIC ATTACK 03/28/2007   KERATOCONJUNCTIVITIS SICCA 03/28/2007   Mitral valve disorder 03/28/2007   GERD 03/28/2007   IBS 03/28/2007   Rosacea 03/28/2007   PLANTAR FASCIITIS 03/28/2007   MIGRAINES, HX OF 03/28/2007    Past Medical History:  Diagnosis Date   Acute renal insufficiency 05/31/2014   pt not aware of this diagnosis   Anemia, iron deficiency    Anxiety    Bilateral lower extremity edema    a. uses torsemide   Broken wrist    did not require surgery   Cervical dysplasia    abnormal paps   Colon cancer screening 06/14/2014   Coronary artery disease    Cough 08/08/2012   Degenerative disk disease 11/19/2011   Depression    Diabetes mellitus type II    Diverticulosis    DJD (degenerative joint disease)    Drug rash 05/22/2011   Dry eyes    Dysrhythmia    metoprolol.   Edema    Elevated liver enzymes 03/21/2012   Encounter for routine gynecological examination 06/14/2014   ESOPHAGITIS 03/28/2007   Qualifier: Hospitalized for  By: Lindwood Qua CMA, Jerl Santos     Fibromyalgia    GASTRITIS 03/28/2007   Qualifier: History of  By: Lindwood Qua CMA, Jerl Santos  GERD (gastroesophageal reflux disease)    Hemorrhoids    external   HLD (hyperlipidemia)    HNP (herniated nucleus pulposus) 11/1997   T6,7,8 with DJD   HTN (hypertension)    Hyperglycemia 05/13/2008   Qualifier: Diagnosis of  By: Milinda Antis MD, Colon Flattery    Hypothyroidism    Interstitial lung disease (HCC)    pt not aware of this   Left ovarian cyst    x 3, rupture   Osteoarthritis    hands   Osteopenia    mild-11/01; improved 12/05   Other screening mammogram 08/18/2011   Palpitations    PERSONAL HISTORY ALLERGY UNSPEC MEDICINAL AGENT 03/18/2010   Qualifier: Diagnosis of  By: Milinda Antis MD, Colon Flattery    PONV (postoperative nausea and vomiting)    Raynaud's disease    Recurrent HSV  (herpes simplex virus)    lesions in nose or mouth with frequent ulcers. d/t meds and dry mouth   Rheumatoid arthritis (HCC)    Rhinitis 03/21/2012   Routine general medical examination at a health care facility 03/28/2011    Family History  Problem Relation Age of Onset   Lung cancer Mother        + smoker   Coronary artery disease Mother        relatively young   Emphysema Father        + smoker   Lymphoma Sister    Heart disease Brother    Lymphoma Brother    Parkinson's disease Brother    Anemia Brother        aplastic    Diabetes Brother    Iron deficiency Daughter    Leukemia Son    Breast cancer Neg Hx    Past Surgical History:  Procedure Laterality Date   ABDOMINAL EXPLORATION SURGERY     ACDF  1991   BIOPSY OF SKIN SUBCUTANEOUS TISSUE AND/OR MUCOUS MEMBRANE Left 04/08/2021   Procedure: BIOPSY OF SKIN SUBCUTANEOUS TISSUE AND/OR MUCOUS MEMBRANE ( REMOVAL SKIN NODULE);  Surgeon: Renford Dills, MD;  Location: [facility] ORS;  Service: Vascular;  Laterality: Left;   COLONOSCOPY  08/2000   Diverticulosis; hemorrhoids   HAND SURGERY Left    left thumb. 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   Lives with husband. Feels safe in her home.   Immunization History  Administered Date(s) Administered   Fluad Quad(high Dose 65+) 08/07/2020, 07/13/2022   Fluad Trivalent(High Dose 65+) 06/27/2023   Influenza Split 06/19/2011   Influenza Whole 08/01/2009   Influenza, High Dose Seasonal PF 08/14/2018, 07/03/2019   Influenza,inj,Quad PF,6+ Mos 06/14/2014, 08/25/2015, 07/26/2016, 08/19/2017   Moderna Sars-Covid-2 Vaccination 09/17/2020   PFIZER(Purple Top)SARS-COV-2 Vaccination 11/27/2019, 12/18/2019   Pneumococcal Conjugate-13 08/25/2015   Pneumococcal Polysaccharide-23 08/25/2016   Td 11/25/2003   Tdap 06/14/2014   Zoster, Live 09/16/2014      Objective: Vital Signs: BP 135/87 (BP Location: Left Arm, Patient Position: Sitting, Cuff Size: Normal)   Pulse 70   Resp 13   Ht 5' (1.524 m)   Wt 175 lb 6.4 oz (79.6 kg)   BMI 34.26 kg/m    Physical Exam Vitals and nursing note reviewed.  Constitutional:      Appearance: She is well-developed.  HENT:     Head: Normocephalic and atraumatic.  Eyes:     Conjunctiva/sclera: Conjunctivae normal.  Cardiovascular:  Rate and Rhythm: Normal rate and regular rhythm.     Heart sounds: Normal heart sounds.  Pulmonary:     Effort: Pulmonary effort is normal.     Breath sounds: Normal breath sounds.  Abdominal:     General: Bowel sounds are normal.     Palpations: Abdomen is soft.  Musculoskeletal:     Cervical back: Normal range of motion.  Lymphadenopathy:     Cervical: No cervical adenopathy.  Skin:    General: Skin is warm and dry.     Capillary Refill: Capillary refill takes less than 2 seconds.  Neurological:     Mental Status: She is alert and oriented to person, place, and time.  Psychiatric:        Behavior: Behavior normal.         Musculoskeletal Exam: Patient remained seated during examination today.  C-spine is limited range of motion.  Shoulder joints have limited abduction to about 110 degrees bilaterally.  Limited internal rotation of both shoulders noted.  Elbow joints have good range of motion.  Synovial thickening of both wrist joints with tenderness over the ulnar aspect of the right wrist.  Synovial thickening of all MCP joints.  Hip joints difficult assess in seated position.  Knee joints have good range of motion no warmth or effusion.  Ankle joints have good range of motion with no tenderness or joint swelling.  CDAI Exam: CDAI Score: -- Patient Global: --; Provider Global: -- Swollen: --; Tender: -- Joint Exam 12/14/2023   No joint exam has been documented for this visit   There is currently no information documented on the homunculus. Go to  the Rheumatology activity and complete the homunculus joint exam.  Investigation: No additional findings.  Imaging: No results found.  Recent Labs: Lab Results  Component Value Date   WBC 6.0 09/05/2023   HGB 13.2 09/05/2023   PLT 323.0 09/05/2023   NA 140 09/05/2023   K 4.9 09/05/2023   CL 99 09/05/2023   CO2 32 09/05/2023   GLUCOSE 146 (H) 09/05/2023   BUN 16 09/05/2023   CREATININE 0.86 09/05/2023   BILITOT 0.4 09/05/2023   ALKPHOS 48 09/05/2023   AST 15 09/05/2023   ALT 12 09/05/2023   PROT 6.5 09/05/2023   ALBUMIN 4.0 09/05/2023   CALCIUM 8.8 09/05/2023   GFRAA 75 03/24/2021   QFTBGOLDPLUS NEGATIVE 03/22/2023    Speciality Comments: Arava- mood disorder  Procedures:  No procedures performed Allergies: Ceftin [cefuroxime], Clonidine derivatives, Erythromycin, Keflex [cephalexin], Penicillins, Sulfonamide derivatives, Ciprofloxacin, Fluoxetine hcl, Furosemide, Gabapentin, Hydroxychloroquine, Paroxetine, Pregabalin, Tetracycline, Venlafaxine, Etodolac, Macrobid [nitrofurantoin], Amitriptyline hcl, Atorvastatin, Benicar [olmesartan medoxomil], Cephalexin, Cetirizine hcl, Diovan [valsartan], and Naproxen sodium    Assessment / Plan:     Visit Diagnoses: Rheumatoid arthritis of multiple sites with negative rheumatoid factor [facility]): Patient has been experiencing increased pain and stiffness involving both shoulders, right wrist, and both hands for the past few days.  She remains on Actemra 162 mg sq injections every 14 days.  She has started tapering prednisone by 1 mg every month and for the month of February she has been taking 7 mg daily.  Since reducing the dose of prednisone she has noticed increased fatigue and has had increased pain and stiffness.  No obvious synovitis was noted on examination today but she has some tenderness along the ulnar aspect of her right wrist.  She is currently using a right wrist brace for support.  Plan to try a slower taper of 1  mg every 2  months as tolerated.  She will remain on Actemra as prescribed.  She will follow-up in the office in 3 months or sooner if needed.  ILD (interstitial lung disease) (HCC) - Evaluated by Dr. Marchelle Gearing on 03/01/2023: Very mild ILD: based on symptoms, walk test, pulmonary function test and CT scan.   No new or worsening pulmonary symptoms.  Patient remains on Actemra 162 mg sq injections every 14 days.  She has been tapering prednisone from 10 mg and is now currently taking 7 mg of prednisone daily. Scheduled to update high-resolution chest CT on 12/27/2023.  She will be following up with Dr. Marchelle Gearing on 01/17/2024.  High risk medication use - Actemra 162 mg subcutaneous injections every 14 days and prednisone 7 mg daily.  Plan to try tapering prednisone by 1 mg every 2 months. CBC and CMP updated on 09/05/23.  Orders for CBC and CMP released today.  Lipid panel updated on 09/05/23.  Hgb A1C 09/05/23.  TB gold negative on 03/22/23.  Future order for TB gold placed today. Discussed the importance of holding actemra if she develops signs or symptoms of an infection and to resume once the infection has completely cleared.   - Plan: COMPLETE METABOLIC PANEL WITH GFR, CBC with Differential/Platelet, QuantiFERON-TB Gold Plus  Screening for tuberculosis - Future order for TB gold placed today. Plan: QuantiFERON-TB Gold Plus  Raynaud's disease without gangrene: Not currently symptomatic.   Chronic left shoulder pain: Limited internal rotation.   Primary osteoarthritis of both knees:  No warmth or effusion of knees joints noted.   DDD (degenerative disc disease), cervical: C-spine has limited range of motion.  History of fusion of cervical spine (C5-6 ACDF): C-spine has limited range of motion.  DDD (degenerative disc disease), thoracic: Thoracic kyphosis.  Spondylosis of lumbar spine: Chronic pain.  Limited mobility.  Fibromyalgia syndrome - She continues to have generalized myalgias and muscle  tenderness due to fibromyalgia.  She remains on Cymbalta as prescribed and is under the care of pain management.  Chronic pain syndrome: Under care of pain management.   Long term current use of opiate analgesic - Followed by pain management-taking hydromorphone twice daily and oxycodone for breakthrough pain.  Osteopenia of multiple sites - DEXA April 19, 2023 T-score -2.1 right total femur, BMD 0.745.She would benefit from the use of Fosamax or Actonel.  She is taking vitamin D 1000 units daily.   History of vertebral fracture - No kyphoplasty performed.  History of wrist fracture - Left-fall 02/13/23.  Vitamin D deficiency: She is taking vitamin D 1000 units daily.   Redness of right eye: Started Monday.  Woke up this morning with her right eye crusted and discolored drainage.  Photographs attached above.  Patient will be evaluated by ophthalmology today-she will have records forwarded to our office to review.  Other medical conditions are listed as follows:  Chronic renal impairment, stage 3a (HCC): CMP with GFR updated today.  Type 2 diabetes mellitus without complication, without long-term current use of insulin (HCC)  Depression with anxiety  History of hypothyroidism  History of IBS  Primary hypertension: Blood pressure was 135/87 today in the office.  Hx of arteriovenous malformation (AVM)  Mitral valve disorder  History of gastroesophageal reflux (GERD)  Venous ulcer of left leg (HCC)  Hyperlipidemia associated with type 2 diabetes mellitus (HCC)    Orders: Orders Placed This Encounter  Procedures   COMPLETE METABOLIC PANEL WITH GFR   CBC with Differential/Platelet  QuantiFERON-TB Gold Plus   Meds ordered this encounter  Medications   predniSONE (DELTASONE) 1 MG tablet    Sig: TAKE 1 TABLET BY MOUTH EVERY DAY WITH BREAKFAST FOR 2 MONTHS (ALONG WITH A 5MG  TABLET), THEN REDUCE DOSE BY 1 MG EVERY 2 MONTHS    Dispense:  60 tablet    Refill:  0    NEW RX      Follow-Up Instructions: Return in about 3 months (around 03/12/2024) for Rheumatoid arthritis, ILD.   Gearldine Bienenstock, PA-C  Note - This record has been created using Dragon software.  Chart creation errors have been sought, but may not always  have been located. Such creation errors do not reflect on  the standard of medical care.

## 2023-12-12 ENCOUNTER — Other Ambulatory Visit: Payer: Self-pay | Admitting: *Deleted

## 2023-12-12 DIAGNOSIS — J849 Interstitial pulmonary disease, unspecified: Secondary | ICD-10-CM

## 2023-12-12 DIAGNOSIS — Z79899 Other long term (current) drug therapy: Secondary | ICD-10-CM

## 2023-12-12 DIAGNOSIS — M0609 Rheumatoid arthritis without rheumatoid factor, multiple sites: Secondary | ICD-10-CM

## 2023-12-12 MED ORDER — ACTEMRA ACTPEN 162 MG/0.9ML ~~LOC~~ SOAJ: 162.0000 mg | SUBCUTANEOUS | 0 refills | Status: DC

## 2023-12-12 NOTE — Telephone Encounter (Signed)
 Last Fill: 09/07/2023  Labs: 09/05/2023 Glucose 146  TB Gold: 03/22/2023 Neg    Next Visit: 12/14/2023  Last Visit: 09/13/2023  [id number] arthritis of multiple sites with negative rheumatoid factor   Current Dose per office note 09/13/2023: Actemra 162 mg subcutaneous injections every 14 days.   Patient to update labs at upcoming appointment on 12/14/2023.   Okay to refill Actemra?

## 2023-12-14 ENCOUNTER — Ambulatory Visit: Payer: Medicare Other | Attending: Physician Assistant | Admitting: Physician Assistant

## 2023-12-14 ENCOUNTER — Encounter: Payer: Self-pay | Admitting: Physician Assistant

## 2023-12-14 VITALS — BP 135/87 | HR 70 | Resp 13 | Ht 60.0 in | Wt 175.4 lb

## 2023-12-14 DIAGNOSIS — Z981 Arthrodesis status: Secondary | ICD-10-CM

## 2023-12-14 DIAGNOSIS — I059 Rheumatic mitral valve disease, unspecified: Secondary | ICD-10-CM

## 2023-12-14 DIAGNOSIS — E559 Vitamin D deficiency, unspecified: Secondary | ICD-10-CM

## 2023-12-14 DIAGNOSIS — I83029 Varicose veins of left lower extremity with ulcer of unspecified site: Secondary | ICD-10-CM

## 2023-12-14 DIAGNOSIS — L97929 Non-pressure chronic ulcer of unspecified part of left lower leg with unspecified severity: Secondary | ICD-10-CM

## 2023-12-14 DIAGNOSIS — Z8781 Personal history of (healed) traumatic fracture: Secondary | ICD-10-CM

## 2023-12-14 DIAGNOSIS — G894 Chronic pain syndrome: Secondary | ICD-10-CM

## 2023-12-14 DIAGNOSIS — I7300 Raynaud's syndrome without gangrene: Secondary | ICD-10-CM | POA: Diagnosis not present

## 2023-12-14 DIAGNOSIS — J849 Interstitial pulmonary disease, unspecified: Secondary | ICD-10-CM | POA: Diagnosis not present

## 2023-12-14 DIAGNOSIS — M5134 Other intervertebral disc degeneration, thoracic region: Secondary | ICD-10-CM

## 2023-12-14 DIAGNOSIS — M170 Bilateral primary osteoarthritis of knee: Secondary | ICD-10-CM | POA: Diagnosis not present

## 2023-12-14 DIAGNOSIS — M5030 Other cervical disc degeneration, unspecified cervical region: Secondary | ICD-10-CM

## 2023-12-14 DIAGNOSIS — Z8639 Personal history of other endocrine, nutritional and metabolic disease: Secondary | ICD-10-CM

## 2023-12-14 DIAGNOSIS — Z79899 Other long term (current) drug therapy: Secondary | ICD-10-CM | POA: Diagnosis not present

## 2023-12-14 DIAGNOSIS — M797 Fibromyalgia: Secondary | ICD-10-CM

## 2023-12-14 DIAGNOSIS — G8929 Other chronic pain: Secondary | ICD-10-CM

## 2023-12-14 DIAGNOSIS — I10 Essential (primary) hypertension: Secondary | ICD-10-CM

## 2023-12-14 DIAGNOSIS — H16041 Marginal corneal ulcer, right eye: Secondary | ICD-10-CM | POA: Diagnosis not present

## 2023-12-14 DIAGNOSIS — M47816 Spondylosis without myelopathy or radiculopathy, lumbar region: Secondary | ICD-10-CM | POA: Diagnosis not present

## 2023-12-14 DIAGNOSIS — M25512 Pain in left shoulder: Secondary | ICD-10-CM | POA: Diagnosis not present

## 2023-12-14 DIAGNOSIS — N1831 Chronic kidney disease, stage 3a: Secondary | ICD-10-CM

## 2023-12-14 DIAGNOSIS — E1169 Type 2 diabetes mellitus with other specified complication: Secondary | ICD-10-CM

## 2023-12-14 DIAGNOSIS — E785 Hyperlipidemia, unspecified: Secondary | ICD-10-CM

## 2023-12-14 DIAGNOSIS — M8589 Other specified disorders of bone density and structure, multiple sites: Secondary | ICD-10-CM

## 2023-12-14 DIAGNOSIS — M0609 Rheumatoid arthritis without rheumatoid factor, multiple sites: Secondary | ICD-10-CM | POA: Diagnosis not present

## 2023-12-14 DIAGNOSIS — F418 Other specified anxiety disorders: Secondary | ICD-10-CM

## 2023-12-14 DIAGNOSIS — H5789 Other specified disorders of eye and adnexa: Secondary | ICD-10-CM

## 2023-12-14 DIAGNOSIS — Z111 Encounter for screening for respiratory tuberculosis: Secondary | ICD-10-CM

## 2023-12-14 DIAGNOSIS — Z8774 Personal history of (corrected) congenital malformations of heart and circulatory system: Secondary | ICD-10-CM

## 2023-12-14 DIAGNOSIS — E119 Type 2 diabetes mellitus without complications: Secondary | ICD-10-CM

## 2023-12-14 DIAGNOSIS — Z8719 Personal history of other diseases of the digestive system: Secondary | ICD-10-CM

## 2023-12-14 DIAGNOSIS — Z79891 Long term (current) use of opiate analgesic: Secondary | ICD-10-CM

## 2023-12-14 MED ORDER — PREDNISONE 1 MG PO TABS: ORAL_TABLET | ORAL | 0 refills | Status: DC

## 2023-12-15 LAB — CBC WITH DIFFERENTIAL/PLATELET
Absolute Lymphocytes: 1960 {cells}/uL (ref 850–3900)
Absolute Monocytes: 738 {cells}/uL (ref 200–950)
Basophils Absolute: 49 {cells}/uL (ref 0–200)
Basophils Relative: 0.6 %
Eosinophils Absolute: 172 {cells}/uL (ref 15–500)
Eosinophils Relative: 2.1 %
HCT: 41.7 % (ref 35.0–45.0)
Hemoglobin: 13.7 g/dL (ref 11.7–15.5)
MCH: 29.7 pg (ref 27.0–33.0)
MCHC: 32.9 g/dL (ref 32.0–36.0)
MCV: 90.3 fL (ref 80.0–100.0)
MPV: 9.3 fL (ref 7.5–12.5)
Monocytes Relative: 9 %
Neutro Abs: 5281 {cells}/uL (ref 1500–7800)
Neutrophils Relative %: 64.4 %
Platelets: 407 10*3/uL — ABNORMAL HIGH (ref 140–400)
RBC: 4.62 10*6/uL (ref 3.80–5.10)
RDW: 12.8 % (ref 11.0–15.0)
Total Lymphocyte: 23.9 %
WBC: 8.2 10*3/uL (ref 3.8–10.8)

## 2023-12-15 LAB — COMPLETE METABOLIC PANEL WITH GFR
AG Ratio: 1.4 (calc) (ref 1.0–2.5)
ALT: 12 U/L (ref 6–29)
AST: 15 U/L (ref 10–35)
Albumin: 4.2 g/dL (ref 3.6–5.1)
Alkaline phosphatase (APISO): 63 U/L (ref 37–153)
BUN: 21 mg/dL (ref 7–25)
CO2: 32 mmol/L (ref 20–32)
Calcium: 9.4 mg/dL (ref 8.6–10.4)
Chloride: 96 mmol/L — ABNORMAL LOW (ref 98–110)
Creat: 0.87 mg/dL (ref 0.60–1.00)
Globulin: 3.1 g/dL (ref 1.9–3.7)
Glucose, Bld: 115 mg/dL — ABNORMAL HIGH (ref 65–99)
Potassium: 5.3 mmol/L (ref 3.5–5.3)
Sodium: 137 mmol/L (ref 135–146)
Total Bilirubin: 0.3 mg/dL (ref 0.2–1.2)
Total Protein: 7.3 g/dL (ref 6.1–8.1)
eGFR: 70 mL/min/{1.73_m2} (ref >=60)

## 2023-12-15 NOTE — Progress Notes (Signed)
 Platelet count is borderline elevated-407K. Rest of CBC WNL.  Glucose is 115. Rest of CMP WNL.   Please obtain records from ophthalmology visit yesterday.

## 2023-12-19 DIAGNOSIS — H16041 Marginal corneal ulcer, right eye: Secondary | ICD-10-CM | POA: Diagnosis not present

## 2023-12-19 DIAGNOSIS — H43813 Vitreous degeneration, bilateral: Secondary | ICD-10-CM | POA: Diagnosis not present

## 2023-12-22 DIAGNOSIS — K5903 Drug induced constipation: Secondary | ICD-10-CM | POA: Diagnosis not present

## 2023-12-22 DIAGNOSIS — Z79891 Long term (current) use of opiate analgesic: Secondary | ICD-10-CM | POA: Diagnosis not present

## 2023-12-22 DIAGNOSIS — M5134 Other intervertebral disc degeneration, thoracic region: Secondary | ICD-10-CM | POA: Diagnosis not present

## 2023-12-22 DIAGNOSIS — G894 Chronic pain syndrome: Secondary | ICD-10-CM | POA: Diagnosis not present

## 2023-12-22 DIAGNOSIS — M4725 Other spondylosis with radiculopathy, thoracolumbar region: Secondary | ICD-10-CM | POA: Diagnosis not present

## 2023-12-22 DIAGNOSIS — M961 Postlaminectomy syndrome, not elsewhere classified: Secondary | ICD-10-CM | POA: Diagnosis not present

## 2023-12-22 DIAGNOSIS — M5451 Vertebrogenic low back pain: Secondary | ICD-10-CM | POA: Diagnosis not present

## 2023-12-23 NOTE — Telephone Encounter (Signed)
 The patient will need to clarify with her ophthalmologist how long she will need to hold Actemra if she proceeds with surgical intervention.

## 2023-12-26 DIAGNOSIS — M3501 Sicca syndrome with keratoconjunctivitis: Secondary | ICD-10-CM | POA: Diagnosis not present

## 2023-12-26 DIAGNOSIS — H16041 Marginal corneal ulcer, right eye: Secondary | ICD-10-CM | POA: Diagnosis not present

## 2023-12-26 DIAGNOSIS — H2511 Age-related nuclear cataract, right eye: Secondary | ICD-10-CM | POA: Diagnosis not present

## 2023-12-26 DIAGNOSIS — H2512 Age-related nuclear cataract, left eye: Secondary | ICD-10-CM | POA: Diagnosis not present

## 2023-12-27 ENCOUNTER — Ambulatory Visit
Admission: RE | Admit: 2023-12-27 | Discharge: 2023-12-27 | Disposition: A | Discharge status: Home / Self Care | Payer: Medicare Other | Source: Ambulatory Visit | Attending: Internal Medicine | Admitting: Internal Medicine

## 2023-12-27 DIAGNOSIS — M359 Systemic involvement of connective tissue, unspecified: Secondary | ICD-10-CM

## 2023-12-27 DIAGNOSIS — J8410 Pulmonary fibrosis, unspecified: Secondary | ICD-10-CM | POA: Diagnosis not present

## 2023-12-27 DIAGNOSIS — I700 Atherosclerosis of aorta: Secondary | ICD-10-CM | POA: Diagnosis not present

## 2023-12-27 DIAGNOSIS — J8489 Other specified interstitial pulmonary diseases: Secondary | ICD-10-CM

## 2023-12-27 DIAGNOSIS — J398 Other specified diseases of upper respiratory tract: Secondary | ICD-10-CM | POA: Diagnosis not present

## 2023-12-27 DIAGNOSIS — J90 Pleural effusion, not elsewhere classified: Secondary | ICD-10-CM | POA: Diagnosis not present

## 2023-12-29 ENCOUNTER — Ambulatory Visit: Payer: Medicare Other | Admitting: Dermatology

## 2023-12-29 ENCOUNTER — Encounter: Payer: Self-pay | Admitting: Dermatology

## 2023-12-29 DIAGNOSIS — Q828 Other specified congenital malformations of skin: Secondary | ICD-10-CM

## 2023-12-29 DIAGNOSIS — R21 Rash and other nonspecific skin eruption: Secondary | ICD-10-CM | POA: Diagnosis not present

## 2023-12-29 NOTE — Progress Notes (Signed)
   New Patient Visit   Subjective  Tina Berger is a 73 y.o. female who presents for the following: Spot at right lower leg. Scaly, pink, itches and tingles. Started in April 2024. Has used OTC HC cream on area.  Check skin around eyes. C/O blister-like lesions. Has not changed cosmetics, soaps, moisturizers. Worse when washing face/eyelids with hot water.   The patient has spots, moles and lesions to be evaluated, some may be new or changing and the patient may have concern these could be cancer.    The following portions of the chart were reviewed this encounter and updated as appropriate: medications, allergies, medical history  Review of Systems:  No other skin or systemic complaints except as noted in HPI or Assessment and Plan.  Objective  Well appearing patient in no apparent distress; mood and affect are within normal limits.  A focused examination was performed of the following areas: Right lower leg  Relevant physical exam findings are noted in the Assessment and Plan.  Right Lateral Lower Leg Pink scaly, slightly elevated papule   Assessment & Plan   POROKERATOSIS Right Lateral Lower Leg Patient deferred treatment at this time.   The patient will observe these symptoms, and report promptly any worsening or unexpected persistence.  If well, may return prn.  RASH    Normal appearing skin at B/L eyelids during clinical exam. Advised patient to call for an appointment when flared.   Report of blistering around eyelids Exam: clear today  Plan: Patient will take photos and call to schedule if it recurs. Can be overbook  Return if symptoms worsen or fail to improve.  I, Lawson Radar, CMA, am acting as scribe for Elie Goody, MD.   Documentation: I have reviewed the above documentation for accuracy and completeness, and I agree with the above.  Elie Goody, MD

## 2023-12-29 NOTE — Patient Instructions (Signed)
 Recommend daily broad spectrum sunscreen SPF 30+ to sun-exposed areas, reapply every 2 hours as needed. Call for new or changing lesions.  Staying in the shade or wearing long sleeves, sun glasses (UVA+UVB protection) and wide brim hats (4-inch brim around the entire circumference of the hat) are also recommended for sun protection.    Due to recent changes in healthcare laws, you may see results of your pathology and/or laboratory studies on MyChart before the doctors have had a chance to review them. We understand that in some cases there may be results that are confusing or concerning to you. Please understand that not all results are received at the same time and often the doctors may need to interpret multiple results in order to provide you with the best plan of care or course of treatment. Therefore, we ask that you please give [place] 2 business days to thoroughly review all your results before contacting the office for clarification. Should we see a critical lab result, you will be contacted sooner.   If You Need Anything After Your Visit  If you have any questions or concerns for your doctor, please call our main line at [phone] and press option 4 to reach your doctor's medical assistant. If no one answers, please leave a voicemail as directed and we will return your call as soon as possible. Messages left after 4 pm will be answered the following business day.   You may also send [place] a message via MyChart. We typically respond to MyChart messages within 1-2 business days.  For prescription refills, please ask your pharmacy to contact our office. Our fax number is [phone].  If you have an urgent issue when the clinic is closed that cannot wait until the next business day, you can page your doctor at the number below.    Please note that while we do our best to be available for urgent issues outside of office hours, we are not available 24/7.   If you have an urgent issue and are  unable to reach [place], you may choose to seek medical care at your doctor's office, retail clinic, urgent care center, or emergency room.  If you have a medical emergency, please immediately call 911 or go to the emergency department.  Pager Numbers  - Dr. Gwen Pounds: [phone]  - Dr. Roseanne Reno: [phone]  - Dr. Katrinka Blazing: [phone]   In the event of inclement weather, please call our main line at [phone] for an update on the status of any delays or closures.  Dermatology Medication Tips: Please keep the boxes that topical medications come in in order to help keep track of the instructions about where and how to use these. Pharmacies typically print the medication instructions only on the boxes and not directly on the medication tubes.   If your medication is too expensive, please contact our office at [phone] option 4 or send [place] a message through MyChart.   We are unable to tell what your co-pay for medications will be in advance as this is different depending on your insurance coverage. However, we may be able to find a substitute medication at lower cost or fill out paperwork to get insurance to cover a needed medication.   If a prior authorization is required to get your medication covered by your insurance company, please allow [place] 1-2 business days to complete this process.  Drug prices often vary depending on where the prescription is filled and some pharmacies may offer cheaper prices.  The website  www.goodrx.com contains coupons for medications through different pharmacies. The prices here do not account for what the cost may be with help from insurance (it may be cheaper with your insurance), but the website can give you the price if you did not use any insurance.  - You can print the associated coupon and take it with your prescription to the pharmacy.  - You may also stop by our office during regular business hours and pick up a GoodRx coupon card.  - If you need your  prescription sent electronically to a different pharmacy, notify our office through [place] or by phone at [phone] option 4.     Si Usted Necesita Algo Despus de Su Visita  Tambin puede enviarnos un mensaje a travs de Clinical cytogeneticist. Por lo general respondemos a los mensajes de MyChart en el transcurso de 1 a 2 das hbiles.  Para renovar recetas, por favor pida a su farmacia que se ponga en contacto con nuestra oficina. Annie Sable de fax es [place] [phone].  Si tiene un asunto urgente cuando la clnica est cerrada y que no puede esperar hasta el siguiente da hbil, puede llamar/localizar a su doctor(a) al nmero que aparece a continuacin.   Por favor, tenga en cuenta que aunque hacemos todo lo posible para estar disponibles para asuntos urgentes fuera del horario de [place], no estamos disponibles las 24 horas del da, los 7 [address]  [address] de la [place].   Si tiene un problema urgente y no puede comunicarse con nosotros, puede optar por buscar atencin mdica  en el consultorio de su doctor(a), en una clnica privada, en un centro de atencin urgente o en una sala de emergencias.  Si tiene Engineer, drilling, por favor llame inmediatamente al 911 o vaya a la sala de emergencias.  Nmeros de bper  - Dr. Gwen Pounds: [phone]  - Dra. Roseanne Reno: [phone]  - Dr. Katrinka Blazing: [phone]   En caso de inclemencias del tiempo, por favor llame a Lacy Duverney principal al [phone] para una actualizacin sobre el [place] de cualquier retraso o cierre.  Consejos para la medicacin en dermatologa: Por favor, guarde las cajas en las que vienen los medicamentos de uso tpico para ayudarle a seguir las instrucciones sobre dnde y cmo usarlos. Las farmacias generalmente imprimen las instrucciones del medicamento slo en las cajas y no directamente en los tubos del [place].   Si su medicamento es muy caro, por favor, pngase en contacto con Rolm Gala llamando al  [phone] y presione la opcin 4 o envenos un mensaje a travs de Clinical cytogeneticist.   No podemos decirle cul ser su copago por los medicamentos por adelantado ya que esto es diferente dependiendo de la cobertura de su seguro. Sin embargo, es posible que podamos encontrar un medicamento sustituto a Audiological scientist un formulario para que el seguro cubra el medicamento que se considera necesario.   Si se requiere una autorizacin previa para que su compaa de seguros [place] su medicamento, por favor permtanos de 1 a 2 das hbiles para completar [address]  Los precios de los medicamentos varan con frecuencia dependiendo del Environmental consultant de dnde se surte la receta y alguna farmacias pueden ofrecer precios ms baratos.  El sitio web www.goodrx.com tiene cupones para medicamentos de Health and safety inspector. Los precios aqu no tienen en cuenta lo que podra costar con la ayuda del seguro (puede ser ms barato con su seguro), pero el sitio web puede darle el precio si no utiliz Tourist information centre manager.  - Puede  imprimir el cupn correspondiente y llevarlo con su receta a la farmacia.  - Tambin puede pasar por nuestra oficina durante el horario de atencin regular y Education officer, museum una tarjeta de cupones de GoodRx.  - Si necesita que su receta se enve electrnicamente a una farmacia diferente, informe a nuestra oficina a travs de MyChart de  o por telfono llamando al [phone] y presione la opcin 4.

## 2024-01-09 ENCOUNTER — Other Ambulatory Visit: Payer: Self-pay | Admitting: Physician Assistant

## 2024-01-09 NOTE — Telephone Encounter (Signed)
 Last Fill: 12/14/2023  Next Visit: 03/13/2024  Last Visit: 12/14/2023  Dx: Rheumatoid arthritis of multiple sites with negative rheumatoid factor   Current Dose per office note on 12/14/2023: 7 mg of prednisone daily. lan to try a slower taper of 1 mg every 2 months as tolerated.   Attempted to contact the patient and left message for patient to call the office to verify dose of Prednisone patient is taking.   Okay to refill Prednisone?

## 2024-01-10 ENCOUNTER — Telehealth: Payer: Self-pay | Admitting: *Deleted

## 2024-01-10 MED ORDER — PREDNISONE 1 MG PO TABS: ORAL_TABLET | ORAL | 0 refills | Status: DC

## 2024-01-10 NOTE — Telephone Encounter (Signed)
 Attempted to contact the patient and left message for patient to call the office to verify dose of prednisone.

## 2024-01-10 NOTE — Addendum Note (Signed)
 Addended by: Henriette Combs on: 01/10/2024 02:56 PM   Modules accepted: Orders

## 2024-01-10 NOTE — Telephone Encounter (Signed)
 Patient states she is currently on Prednisone 7 mg daily. Patient states she has not been able to taper. Patient states she has had an eye infection which caused her to have to miss doses of her Actemra. Patient will be continuing Prednisone 7 mg.

## 2024-01-10 NOTE — Telephone Encounter (Signed)
 Patient contacted the office stating she has reached out to the [employer] and they advised her she no longer qualifies for the patient assistance. Patient is under the impression that her insurance does not cover Actemra. Patient would like to know what she should do going forward or if she needs to make an appointment to discuss other treatment options.

## 2024-01-17 ENCOUNTER — Ambulatory Visit: Payer: Medicare Other | Admitting: Internal Medicine

## 2024-01-18 ENCOUNTER — Telehealth: Payer: Self-pay

## 2024-01-18 ENCOUNTER — Other Ambulatory Visit (HOSPITAL_COMMUNITY): Payer: Self-pay

## 2024-01-18 NOTE — Telephone Encounter (Signed)
   Per test claim, Actemra is no longer the preferred formulary medication and will require a Prior Authorization.  Submitted a Prior Authorization request to [facility] for ACTEMRA SQ via CoverMyMeds. Will update once we receive a response.  Key: [facility]

## 2024-01-20 NOTE — Telephone Encounter (Signed)
 Ok to apply for biosimilar tyenne if patient is ok with change.

## 2024-01-20 NOTE — Telephone Encounter (Signed)
 Received a fax regarding Prior Authorization from [facility] for ACTEMRA SQ. Authorization has been DENIED because patient needs to have tried/failed BOTH Tyenne as well as at least one of the preferred alternatives (Rinvoq/Xeljanz).  Denial letter sent to scan center  Peer-to-Peer Phone# [phone]

## 2024-01-23 DIAGNOSIS — H2511 Age-related nuclear cataract, right eye: Secondary | ICD-10-CM | POA: Diagnosis not present

## 2024-01-23 DIAGNOSIS — H40003 Preglaucoma, unspecified, bilateral: Secondary | ICD-10-CM | POA: Diagnosis not present

## 2024-01-23 DIAGNOSIS — H2512 Age-related nuclear cataract, left eye: Secondary | ICD-10-CM | POA: Diagnosis not present

## 2024-01-23 DIAGNOSIS — M3501 Sicca syndrome with keratoconjunctivitis: Secondary | ICD-10-CM | POA: Diagnosis not present

## 2024-01-25 ENCOUNTER — Telehealth: Payer: Self-pay | Admitting: Pharmacist

## 2024-01-25 DIAGNOSIS — J849 Interstitial pulmonary disease, unspecified: Secondary | ICD-10-CM

## 2024-01-25 DIAGNOSIS — Z79899 Other long term (current) drug therapy: Secondary | ICD-10-CM

## 2024-01-25 DIAGNOSIS — M0609 Rheumatoid arthritis without rheumatoid factor, multiple sites: Secondary | ICD-10-CM

## 2024-01-25 NOTE — Telephone Encounter (Signed)
 Submitted a Prior Authorization request to [facility] for  TYENNE  via CoverMyMeds. Will update once we receive a response.  Key: [id number]  Reviewed $2000 OOP max and Medicare Rx Payment Plan. Also [employer] may have patient assistance program for Thana Ates, PharmD, MPH, BCPS, CPP Clinical Pharmacist (Rheumatology and Pulmonology)

## 2024-01-25 NOTE — Telephone Encounter (Signed)
 Received notification from patient's insurance [employer] that ACTEMRA will no longer be preferred tocilizumab product on formulary. Patient has cataract surgery on 02/07/24 and 02/21/2024 and is holding medication  Discussed the switch to biosimilar with the patient. Reviewed that biosimilars are as clinically effective, as safe, and no more harmful than the originator product based on FDA studies. They are similar to generics but are different at molecular level and some require physician approval for the switch. Reviewed that biosimilars are FDA-approved and that studies are completed in patients who are clinically established on Actemra and switched to biosimiliar. Discussed that dose and frequency are the same as for Actemra.  Patient is in agreement to biosimilar switch from Actemra to [place]. Pharmacy team will proceed with benefits investigation for biosimilar.  Chesley Mires, PharmD, MPH, BCPS, CPP Clinical Pharmacist (Rheumatology and Pulmonology)

## 2024-01-30 DIAGNOSIS — H2513 Age-related nuclear cataract, bilateral: Secondary | ICD-10-CM | POA: Diagnosis not present

## 2024-01-30 DIAGNOSIS — H2512 Age-related nuclear cataract, left eye: Secondary | ICD-10-CM | POA: Diagnosis not present

## 2024-01-30 DIAGNOSIS — H2511 Age-related nuclear cataract, right eye: Secondary | ICD-10-CM | POA: Diagnosis not present

## 2024-01-31 ENCOUNTER — Encounter: Payer: Self-pay | Admitting: Ophthalmology

## 2024-01-31 ENCOUNTER — Other Ambulatory Visit: Payer: Self-pay | Admitting: Physician Assistant

## 2024-01-31 NOTE — Telephone Encounter (Signed)
 Last Fill: 11/10/2023   Next Visit: 03/13/2024   Last Visit: 12/14/2023   Dx: Rheumatoid arthritis of multiple sites with negative rheumatoid factor    Current Dose per office note on 12/14/2023: 7 mg of prednisone daily. lan to try a slower taper of 1 mg every 2 months as tolerated.     Per refill note on 01/09/2024: patient will be continuing Prednisone 7 mg.   Okay to refill Prednisone?

## 2024-01-31 NOTE — Anesthesia Preprocedure Evaluation (Addendum)
 Anesthesia Evaluation  Patient identified by MRN, date of birth, ID band Patient awake    Reviewed: Allergy & Precautions, H&P , NPO status , Patient's Chart, lab work & pertinent test results  History of Anesthesia Complications (+) PONV and history of anesthetic complications  Airway Mallampati: IV  TM Distance: <3 FB Neck ROM: Full   Comment: Denies sleep apnea Dental no notable dental hx. (+) Caps Multiple caps, crowns:   Pulmonary neg pulmonary ROS, former smoker Fingers are warm and dry, good waveform with pulse ox, but pulse ox shows sats of 82%, but when pulse ox changed to forehead, get sats of 98%   Pulmonary exam normal breath sounds clear to auscultation       Cardiovascular hypertension, + CAD  negative cardio ROS Normal cardiovascular exam+ dysrhythmias  Rhythm:Regular Rate:Normal  Hx palpitations and CAD, do not see echo nor heart cath in Epic   Neuro/Psych  PSYCHIATRIC DISORDERS Anxiety Depression     Neuromuscular disease negative neurological ROS  negative psych ROS   GI/Hepatic negative GI ROS, Neg liver ROS,GERD  ,,  Endo/Other  negative endocrine ROSdiabetesHypothyroidism    Renal/GU Renal diseasenegative Renal ROS  negative genitourinary   Musculoskeletal negative musculoskeletal ROS (+) Arthritis ,  Fibromyalgia -  Abdominal   Peds negative pediatric ROS (+)  Hematology negative hematology ROS (+) Blood dyscrasia, anemia   Anesthesia Other Findings   GERD (gastroesophageal reflux disease) HLD (hyperlipidemia) HTN (hypertension)  Hypothyroidism Osteoarthritis  Anemia, iron deficiency Diabetes mellitus type II  Edema Raynaud's disease  Fibromyalgia Dry eyes  Recurrent HSV (herpes simplex virus) HNP (herniated nucleus pulposus)  DJD (degenerative joint disease) Cervical dysplasia  Left ovarian cyst Osteopenia  Diverticulosis Hemorrhoids  Interstitial lung disease  (HCC) Palpitations  Bilateral lower extremity edema Cough  Drug rash Elevated liver enzymes  Encounter for routine gynecological examination ESOPHAGITIS  GASTRITIS Colon cancer screening Hyperglycemia Other screening mammogram Rhinitis Routine general medical examination at a health care facility  PERSONAL HISTORY ALLERGY UNSPEC MEDICINAL AGENT Acute renal insufficiency Degenerative disk disease PONV (postoperative nausea and vomiting)  Coronary artery disease Dysrhythmia  Anxiety Depression  Rheumatoid arthritis (HCC) Broken wrist      Reproductive/Obstetrics negative OB ROS                              Anesthesia Physical Anesthesia Plan  ASA: 3  Anesthesia Plan: MAC   Post-op Pain Management:    Induction: Intravenous  PONV Risk Score and Plan:   Airway Management Planned: Natural Airway and Nasal Cannula  Additional Equipment:   Intra-op Plan:   Post-operative Plan:   Informed Consent: I have reviewed the patients History and Physical, chart, labs and discussed the procedure including the risks, benefits and alternatives for the proposed anesthesia with the patient or authorized representative who has indicated his/her understanding and acceptance.     Dental Advisory Given  Plan Discussed with: Anesthesiologist, CRNA and Surgeon  Anesthesia Plan Comments: (Patient consented for risks of anesthesia including but not limited to:  - adverse reactions to medications - damage to eyes, teeth, lips or other oral mucosa - nerve damage due to positioning  - sore throat or hoarseness - Damage to heart, brain, nerves, lungs, other parts of body or loss of life  Patient voiced understanding and assent.)         Anesthesia Quick Evaluation

## 2024-02-01 DIAGNOSIS — M3501 Sicca syndrome with keratoconjunctivitis: Secondary | ICD-10-CM | POA: Diagnosis not present

## 2024-02-06 NOTE — Discharge Instructions (Signed)

## 2024-02-07 ENCOUNTER — Other Ambulatory Visit: Payer: Self-pay

## 2024-02-07 ENCOUNTER — Ambulatory Visit: Admitting: Anesthesiology

## 2024-02-07 ENCOUNTER — Encounter: Payer: Self-pay | Admitting: Ophthalmology

## 2024-02-07 ENCOUNTER — Ambulatory Visit
Admission: RE | Admit: 2024-02-07 | Discharge: 2024-02-07 | Disposition: A | Discharge status: Home / Self Care | Attending: Ophthalmology | Admitting: Ophthalmology

## 2024-02-07 ENCOUNTER — Encounter
Admission: RE | Disposition: A | Discharge status: Home / Self Care | Payer: Self-pay | Source: Home / Self Care | Attending: Ophthalmology

## 2024-02-07 DIAGNOSIS — K219 Gastro-esophageal reflux disease without esophagitis: Secondary | ICD-10-CM | POA: Diagnosis not present

## 2024-02-07 DIAGNOSIS — Z87891 Personal history of nicotine dependence: Secondary | ICD-10-CM | POA: Insufficient documentation

## 2024-02-07 DIAGNOSIS — M797 Fibromyalgia: Secondary | ICD-10-CM | POA: Diagnosis not present

## 2024-02-07 DIAGNOSIS — E1136 Type 2 diabetes mellitus with diabetic cataract: Secondary | ICD-10-CM | POA: Insufficient documentation

## 2024-02-07 DIAGNOSIS — Z7984 Long term (current) use of oral hypoglycemic drugs: Secondary | ICD-10-CM | POA: Diagnosis not present

## 2024-02-07 DIAGNOSIS — E039 Hypothyroidism, unspecified: Secondary | ICD-10-CM | POA: Diagnosis not present

## 2024-02-07 DIAGNOSIS — D509 Iron deficiency anemia, unspecified: Secondary | ICD-10-CM | POA: Insufficient documentation

## 2024-02-07 DIAGNOSIS — H2511 Age-related nuclear cataract, right eye: Secondary | ICD-10-CM | POA: Insufficient documentation

## 2024-02-07 DIAGNOSIS — M19041 Primary osteoarthritis, right hand: Secondary | ICD-10-CM | POA: Diagnosis not present

## 2024-02-07 DIAGNOSIS — I2510 Atherosclerotic heart disease of native coronary artery without angina pectoris: Secondary | ICD-10-CM | POA: Diagnosis not present

## 2024-02-07 DIAGNOSIS — F419 Anxiety disorder, unspecified: Secondary | ICD-10-CM | POA: Insufficient documentation

## 2024-02-07 DIAGNOSIS — M19042 Primary osteoarthritis, left hand: Secondary | ICD-10-CM | POA: Diagnosis not present

## 2024-02-07 HISTORY — PX: CATARACT EXTRACTION W/PHACO: SHX586

## 2024-02-07 LAB — GLUCOSE, CAPILLARY: Glucose-Capillary: 169 mg/dL — ABNORMAL HIGH (ref 70–99)

## 2024-02-07 SURGERY — PHACOEMULSIFICATION, CATARACT, WITH IOL INSERTION
Anesthesia: Monitor Anesthesia Care | Site: Eye | Laterality: Right

## 2024-02-07 MED ORDER — CYCLOPENTOLATE HCL 2 % OP SOLN
1.0000 [drp] | OPHTHALMIC | Status: AC
  Administered 2024-02-07 (×3): 1 [drp] via OPHTHALMIC

## 2024-02-07 MED ORDER — SIGHTPATH DOSE#1 NA CHONDROIT SULF-NA HYALURON 40-17 MG/ML IO SOLN
INTRAOCULAR | Status: DC | PRN
  Administered 2024-02-07: 1 mL via INTRAOCULAR

## 2024-02-07 MED ORDER — SIGHTPATH DOSE#1 BSS IO SOLN
INTRAOCULAR | Status: DC | PRN
Start: 2024-02-07 — End: 2024-02-07
  Administered 2024-02-07: 15 mL via INTRAOCULAR

## 2024-02-07 MED ORDER — MIDAZOLAM HCL 5 MG/5ML IJ SOLN
INTRAMUSCULAR | Status: DC | PRN
  Administered 2024-02-07: 1 mg via INTRAVENOUS

## 2024-02-07 MED ORDER — TETRACAINE HCL 0.5 % OP SOLN
1.0000 [drp] | OPHTHALMIC | Status: AC | PRN
  Administered 2024-02-07 (×3): 1 [drp] via OPHTHALMIC

## 2024-02-07 MED ORDER — MIDAZOLAM HCL 2 MG/2ML IJ SOLN
INTRAMUSCULAR | Status: AC
  Filled 2024-02-07: qty 2

## 2024-02-07 MED ORDER — CYCLOPENTOLATE HCL 2 % OP SOLN
OPHTHALMIC | Status: AC
  Filled 2024-02-07: qty 2

## 2024-02-07 MED ORDER — FENTANYL CITRATE (PF) 100 MCG/2ML IJ SOLN
INTRAMUSCULAR | Status: DC | PRN
  Administered 2024-02-07: 50 ug via INTRAVENOUS

## 2024-02-07 MED ORDER — ONDANSETRON HCL 4 MG/2ML IJ SOLN
4.0000 mg | Freq: Once | INTRAMUSCULAR | Status: AC
  Administered 2024-02-07: 4 mg via INTRAVENOUS

## 2024-02-07 MED ORDER — MOXIFLOXACIN HCL 0.5 % OP SOLN
OPHTHALMIC | Status: DC | PRN
  Administered 2024-02-07: .2 mL via OPHTHALMIC

## 2024-02-07 MED ORDER — FENTANYL CITRATE (PF) 100 MCG/2ML IJ SOLN
INTRAMUSCULAR | Status: AC
  Filled 2024-02-07: qty 2

## 2024-02-07 MED ORDER — LIDOCAINE HCL (PF) 2 % IJ SOLN
INTRAOCULAR | Status: DC | PRN
  Administered 2024-02-07: 2 mL

## 2024-02-07 MED ORDER — ONDANSETRON HCL 4 MG/2ML IJ SOLN
INTRAMUSCULAR | Status: AC
Start: 2024-02-07 — End: ?
  Filled 2024-02-07: qty 2

## 2024-02-07 MED ORDER — TETRACAINE HCL 0.5 % OP SOLN
OPHTHALMIC | Status: AC
  Filled 2024-02-07: qty 4

## 2024-02-07 MED ORDER — PHENYLEPHRINE HCL 10 % OP SOLN
OPHTHALMIC | Status: AC
  Filled 2024-02-07: qty 5

## 2024-02-07 MED ORDER — BRIMONIDINE TARTRATE-TIMOLOL 0.2-0.5 % OP SOLN
OPHTHALMIC | Status: DC | PRN
  Administered 2024-02-07: 1 [drp] via OPHTHALMIC

## 2024-02-07 MED ORDER — PHENYLEPHRINE HCL 10 % OP SOLN
1.0000 [drp] | OPHTHALMIC | Status: AC | PRN
  Administered 2024-02-07 (×3): 1 [drp] via OPHTHALMIC

## 2024-02-07 MED ORDER — SIGHTPATH DOSE#1 BSS IO SOLN
INTRAOCULAR | Status: DC | PRN
  Administered 2024-02-07: 40 mL via OPHTHALMIC

## 2024-02-07 SURGICAL SUPPLY — 12 items
CATARACT SUITE SIGHTPATH (MISCELLANEOUS) ×1 IMPLANT
CYSTOTOME ANGL RVRS SHRT 25G (CUTTER) ×1 IMPLANT
CYSTOTOME ANGL RVRS SHRT 25GA (CUTTER) ×1 IMPLANT
FEE CATARACT SUITE SIGHTPATH (MISCELLANEOUS) ×1 IMPLANT
GLOVE BIOGEL PI IND STRL 8 (GLOVE) ×1 IMPLANT
GLOVE SURG LX STRL 8.0 MICRO (GLOVE) ×1 IMPLANT
GLOVE SURG PROTEXIS BL SZ6.5 (GLOVE) ×1 IMPLANT
GLOVE SURG SYN 6.5 PF PI BL (GLOVE) ×1 IMPLANT
LENS IOL TECNIS EYHANCE 21.5 (Intraocular Lens) IMPLANT
NDL FILTER BLUNT 18X1 1/2 (NEEDLE) ×1 IMPLANT
NEEDLE FILTER BLUNT 18X1 1/2 (NEEDLE) ×1 IMPLANT
SYR 3ML LL SCALE MARK (SYRINGE) ×1 IMPLANT

## 2024-02-07 NOTE — Transfer of Care (Signed)
 Immediate Anesthesia Transfer of Care Note  Patient: Tina Berger  Procedure(s) Performed: PHACOEMULSIFICATION, CATARACT, WITH IOL INSERTION 8.20 00:48.7 (Right: Eye)  Patient Location: PACU  Anesthesia Type: MAC  Level of Consciousness: awake, alert  and patient cooperative  Airway and Oxygen Therapy: Patient Spontanous Breathing and Patient connected to supplemental oxygen  Post-op Assessment: Post-op Vital signs reviewed, Patient's Cardiovascular Status Stable, Respiratory Function Stable, Patent Airway and No signs of Nausea or vomiting  Post-op Vital Signs: Reviewed and stable  Complications: No notable events documented.

## 2024-02-07 NOTE — Op Note (Signed)
 PREOPERATIVE DIAGNOSIS:  Nuclear sclerotic cataract of the right eye.   POSTOPERATIVE DIAGNOSIS:  Right Eye Cataract   OPERATIVE PROCEDURE:ORPROCALL@   SURGEON:  Clair Crews, MD.   ANESTHESIA:  Anesthesiologist: Emilie Harden, MD CRNA: Bill Budd, CRNA  1.      Managed anesthesia care. 2.      0.89ml of Shugarcaine was instilled in the eye following the paracentesis.   COMPLICATIONS:  None.   TECHNIQUE:   Stop and chop   DESCRIPTION OF PROCEDURE:  The patient was examined and consented in the preoperative holding area where the aforementioned topical anesthesia was applied to the right eye and then brought back to the Operating Room where the right eye was prepped and draped in the usual sterile ophthalmic fashion and a lid speculum was placed. A paracentesis was created with the side port blade and the anterior chamber was filled with viscoelastic. A near clear corneal incision was performed with the steel keratome. A continuous curvilinear capsulorrhexis was performed with a cystotome followed by the capsulorrhexis forceps. Hydrodissection and hydrodelineation were carried out with BSS on a blunt cannula. The lens was removed in a stop and chop  technique and the remaining cortical material was removed with the irrigation-aspiration handpiece. The capsular bag was inflated with viscoelastic and the Technis ZCB00  lens was placed in the capsular bag without complication. The remaining viscoelastic was removed from the eye with the irrigation-aspiration handpiece. The wounds were hydrated. The anterior chamber was flushed with BSS and the eye was inflated to physiologic pressure. 0.17ml of Vigamox  was placed in the anterior chamber. The wounds were found to be water tight. The eye was dressed with Combigan . The patient was given protective glasses to wear throughout the day and a shield with which to sleep tonight. The patient was also given drops with which to begin a drop regimen  today and will follow-up with me in one day. Implant Name Type Inv. Item Serial No. Manufacturer Lot No. LRB No. Used Action  LENS IOL TECNIS EYHANCE 21.5 - [id number] Intraocular Lens LENS IOL TECNIS EYHANCE 21.5 [id number] [employer]  Right 1 Implanted   Procedure(s): PHACOEMULSIFICATION, CATARACT, WITH IOL INSERTION 8.20 00:48.7 (Right)  Electronically signed: Clair Crews 02/07/2024 10:33 AM

## 2024-02-07 NOTE — Anesthesia Postprocedure Evaluation (Signed)
 Anesthesia Post Note  Patient: Tina Berger  Procedure(s) Performed: PHACOEMULSIFICATION, CATARACT, WITH IOL INSERTION 8.20 00:48.7 (Right: Eye)  Patient location during evaluation: PACU Anesthesia Type: MAC Level of consciousness: awake and alert Pain management: pain level controlled Vital Signs Assessment: post-procedure vital signs reviewed and stable Respiratory status: spontaneous breathing, nonlabored ventilation, respiratory function stable and patient connected to nasal cannula oxygen Cardiovascular status: stable and blood pressure returned to baseline Postop Assessment: no apparent nausea or vomiting Anesthetic complications: no   No notable events documented.   Last Vitals:  Vitals:   02/07/24 1034 02/07/24 1039  BP: 121/77 131/75  Pulse: 82 84  Resp: 19 18  Temp: 36.6 C 36.6 C  SpO2: (!) 89% 91%    Last Pain:  Vitals:   02/07/24 1039  TempSrc:   PainSc: 0-No pain                 Erryn Dickison C Zareya Tuckett

## 2024-02-07 NOTE — H&P (Signed)
 [facility]   Primary Care Physician:  Tower, Manley Seeds, MD Ophthalmologist: Dr. Merrell Abate  Pre-Procedure History & Physical: HPI:  Tina Berger is a 73 y.o. female here for cataract surgery.   Past Medical History:  Diagnosis Date   Acute renal insufficiency 05/31/2014   pt not aware of this diagnosis   Anemia, iron deficiency    Anxiety    Bilateral lower extremity edema    a. uses torsemide    Broken wrist    did not require surgery   Cervical dysplasia    abnormal paps   Colon cancer screening 06/14/2014   Coronary artery disease    Cough 08/08/2012   Degenerative disk disease 11/19/2011   Depression    Diabetes mellitus type II    Diverticulosis    DJD (degenerative joint disease)    Drug rash 05/22/2011   Dry eyes    Dysrhythmia    metoprolol .   Edema    Elevated liver enzymes 03/21/2012   Encounter for routine gynecological examination 06/14/2014   ESOPHAGITIS 03/28/2007   Qualifier: Hospitalized for  By: Lennice Quivers CMA, Kelly Patient     Fibromyalgia    GASTRITIS 03/28/2007   Qualifier: History of  By: Lennice Quivers CMA, Kelly Patient     GERD (gastroesophageal reflux disease)    Hemorrhoids    external   HLD (hyperlipidemia)    HNP (herniated nucleus pulposus) 11/1997   T6,7,8 with DJD   HTN (hypertension)    Hyperglycemia 05/13/2008   Qualifier: Diagnosis of  By: Malissa Se MD, Dannette Dutch    Hypothyroidism    Interstitial lung disease (HCC)    pt not aware of this   Left ovarian cyst    x 3, rupture   Osteoarthritis    hands   Osteopenia    mild-11/01; improved 12/05   Other screening mammogram 08/18/2011   Palpitations    PERSONAL HISTORY ALLERGY UNSPEC MEDICINAL AGENT 03/18/2010   Qualifier: Diagnosis of  By: Malissa Se MD, Dannette Dutch    PONV (postoperative nausea and vomiting)    Raynaud's disease    Recurrent HSV (herpes simplex virus)    lesions in nose or mouth with frequent ulcers. d/t meds and dry mouth   Rheumatoid arthritis (HCC)    Rhinitis  03/21/2012   Routine general medical examination at a health care facility 03/28/2011    Past Surgical History:  Procedure Laterality Date   ABDOMINAL EXPLORATION SURGERY     ACDF  1991   BIOPSY OF SKIN SUBCUTANEOUS TISSUE AND/OR MUCOUS MEMBRANE Left 04/08/2021   Procedure: BIOPSY OF SKIN SUBCUTANEOUS TISSUE AND/OR MUCOUS MEMBRANE ( REMOVAL SKIN NODULE);  Surgeon: Jackquelyn Mass, MD;  Location: [facility] ORS;  Service: Vascular;  Laterality: Left;   COLONOSCOPY  08/2000   Diverticulosis; hemorrhoids   HAND SURGERY Left    left thumb. PIN REMOVED   LASIK     bilateral    Prior to Admission medications   Medication Sig Start Date End Date Taking? Authorizing Provider  Acetaminophen  (TYLENOL  ARTHRITIS PAIN PO) Take 650 mg by mouth daily.   Yes [provider]  buPROPion  (WELLBUTRIN  XL) 150 MG 24 hr tablet TAKE 1 TABLET BY MOUTH DAILY 11/29/23  Yes Tower, Manley Seeds, MD  Chlorphen-Phenyleph-ASA (ALKA-SELTZER PLUS COLD PO) Take by mouth as needed. Just for colds   Yes [provider]  cholecalciferol (VITAMIN D3) 25 MCG (1000 UNIT) tablet Take 1,000 Units by mouth daily.   Yes [provider]  cyclobenzaprine  (FLEXERIL ) 5  MG tablet Take 5 mg by mouth daily as needed. 03/17/22  Yes [provider]  Diclofenac  Sodium 1.5 % SOLN Place 2 mLs onto the skin 4 (four) times daily. 08/15/15  Yes Tower, Manley Seeds, MD  DULoxetine  (CYMBALTA ) 60 MG capsule TAKE 1 CAPSULE BY MOUTH EVERY DAY 10/28/23  Yes Tower, Manley Seeds, MD  esomeprazole  (NEXIUM ) 20 MG capsule TAKE 1 CAPSULE BY MOUTH DAILY 11/29/23  Yes Tower, Manley Seeds, MD  famotidine  (PEPCID ) 40 MG tablet TAKE 1 TABLET BY MOUTH DAILY 11/29/23  Yes Tower, Marne A, MD  ferrous sulfate 325 (65 FE) MG EC tablet Take 325 mg by mouth daily with breakfast.   Yes [provider]  GUAIFENESIN CR PO Take by mouth as needed.   Yes [provider]  HYDROmorphone HCl (EXALGO) 12 MG T24A SR tablet Take 12 mg by mouth 2 (two)  times daily.   Yes [provider]  hyoscyamine  (LEVSIN SL) 0.125 MG SL tablet Take 1 tablet (0.125 mg total) by mouth 2 (two) times daily as needed. 10/28/23  Yes Tower, Marne A, MD  ketoconazole (NIZORAL) 2 % shampoo SHAMPOO WITH A SMALL AMOUNT AS DIRECTED THREE TIMES A WEEK SHAMPOO SCALP 3 DAYS A WEEK, LET SHAMPOO SIT FOR 10 MINUTES AND RINSE OFF 06/16/20  Yes Moye, Virginia , MD  levothyroxine  (SYNTHROID ) 125 MCG tablet TAKE 1 TABLET BY MOUTH DAILY  BEFORE BREAKFAST 11/28/23  Yes Tower, Manley Seeds, MD  LYSINE PO Take by mouth as needed. OTC for ulcer prophylaxis   Yes [provider]  Magnesium  400 MG CAPS Take by mouth daily.   Yes [provider]  metFORMIN  (GLUCOPHAGE -XR) 500 MG 24 hr tablet TAKE 2 TABLETS BY MOUTH TWICE  DAILY WITH A MEAL 11/29/23  Yes Tower, Marne A, MD  metoprolol  succinate (TOPROL -XL) 50 MG 24 hr tablet TAKE 1 TABLET BY MOUTH TWICE  DAILY 11/29/23  Yes Tower, Manley Seeds, MD  naloxone [facility]) nasal spray 4 mg/0.1 mL  01/20/22  Yes [provider]  ondansetron  (ZOFRAN ) 4 MG tablet Take 1 tablet (4 mg total) by mouth every 8 (eight) hours as needed for nausea or vomiting. 10/05/22  Yes Romayne Clubs, PA-C  oxyCODONE  (OXYCONTIN ) 10 MG 12 hr tablet Take 10 mg by mouth 3 (three) times daily as needed. Beakthrough pain   Yes [provider]  predniSONE  (DELTASONE ) 5 MG tablet TAKE 1 TABLET (5 MG TOTAL) BY MOUTH DAILY. 01/31/24  Yes Romayne Clubs, PA-C  ramipril  (ALTACE ) 5 MG capsule TAKE 1 CAPSULE BY MOUTH DAILY 11/29/23  Yes Tower, Manley Seeds, MD  rosuvastatin  (CRESTOR ) 5 MG tablet TAKE 1 TABLET BY MOUTH TWICE  WEEKLY 09/19/23  Yes Tower, Manley Seeds, MD  tiZANidine (ZANAFLEX) 4 MG tablet Take 4 mg by mouth every 8 (eight) hours as needed for muscle spasms. 10/26/22  Yes [provider]  Tocilizumab  (ACTEMRA  ACTPEN) 162 MG/0.9ML SOAJ Inject 162 mg into the skin every 14 (fourteen) days. 12/12/23  Yes Deveshwar, Clydie Darter, MD  vitamin B-12  (CYANOCOBALAMIN ) 500 MCG tablet Take 500 mcg by mouth daily.   Yes [provider]  Xylitol (XYLIMELTS MT) Use as directed in the mouth or throat.   Yes [provider]  glucose blood test strip One Touch Ultra stripts blue-To check sugar once daily and as needed for DM2 250.00 03/23/22   Tower, Mitchell Ana A, MD  predniSONE  (DELTASONE ) 1 MG tablet TAKE 1 TABLET BY MOUTH EVERY DAY WITH BREAKFAST FOR 2 MONTHS (ALONG  WITH A 5MG  TABLET), THEN REDUCE DOSE BY 1 MG EVERY 2 MONTHS 01/31/24   Romayne Clubs, PA-C  predniSONE  (DELTASONE ) 10 MG tablet Take 1 tablet (10 mg total) by mouth daily with breakfast. Patient not taking: Reported on 12/14/2023 09/20/22   Romayne Clubs, PA-C    Allergies as of 01/25/2024 - Review Complete 12/29/2023  Allergen Reaction Noted   Ceftin  [cefuroxime ] Swelling 06/01/2012   Clonidine  derivatives  08/18/2011   Erythromycin Swelling 03/28/2007   Keflex  [cephalexin ] Anaphylaxis 06/01/2012   Penicillins Anaphylaxis 03/28/2007   Sulfonamide derivatives Swelling 03/28/2007   Ciprofloxacin  03/18/2010   Fluoxetine hcl  06/14/2008   Furosemide Swelling 03/28/2007   Gabapentin Other (See Comments) 03/28/2007   Hydroxychloroquine  Itching 05/04/2022   Paroxetine  06/14/2008   Pregabalin  06/14/2008   Tetracycline  03/28/2007   Venlafaxine  06/14/2008   Etodolac  03/28/2007   Macrobid  [nitrofurantoin ]  03/18/2010   Amitriptyline hcl  06/14/2008   Atorvastatin  08/25/2009   Benicar  [olmesartan  medoxomil]  08/18/2011   Cephalexin  Hives and Rash 05/21/2011   Cetirizine hcl  03/28/2007   Diovan [valsartan]  07/07/2012   Naproxen sodium Other (See Comments) 05/24/2007    Family History  Problem Relation Age of Onset   Lung cancer Mother        + smoker   Coronary artery disease Mother        relatively young   Emphysema Father        + smoker   Lymphoma Sister    Heart disease Brother    Lymphoma Brother    Parkinson's disease Brother    Anemia Brother         aplastic    Diabetes Brother    Iron deficiency Daughter    Leukemia Son    Breast cancer Neg Hx     Social History   Socioeconomic History   Marital status: Married    Spouse name: Ronnie   Number of children: 1   Years of education: Not on file   Highest education level: Not on file  Occupational History   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 [employer]    Employer: [employer]    Comment: retired  Tobacco Use   Smoking status: Former    Current packs/day: 0.00    Average packs/day: 1 pack/day for 5.0 years (5.0 ttl pk-yrs)    Types: Cigarettes    Start date: 10/19/1963    Quit date: 10/18/1968    Years since quitting: 55.3    Passive exposure: Never   Smokeless tobacco: Never   Tobacco comments:    quit over 40 years  Vaping Use   Vaping status: Never Used  Substance and Sexual Activity   Alcohol use: No    Alcohol/week: 0.0 standard drinks of alcohol   Drug use: No   Sexual activity: Yes  Other Topics Concern   Not on file  Social History Narrative   Married1 childSecretary to dean at [employer] regularly exerciseDaily caffeine use: 2/day.   Lives with husband. Feels safe in her home.   Social Drivers of Corporate investment banker Strain: Low Risk  (05/10/2023)   Overall Financial Resource Strain (CARDIA)    Difficulty of Paying Living Expenses: Not hard at all  Food Insecurity: No Food Insecurity (05/10/2023)   Hunger Vital Sign    Worried About Running Out of Food in the Last Year: Never true    Ran Out of Food in the Last Year: Never true  Transportation Needs: No Transportation Needs (05/10/2023)   PRAPARE - Administrator, Civil Service (Medical): No    Lack of Transportation (Non-Medical): No  Physical Activity: Insufficiently Active (05/10/2023)   Exercise Vital Sign    Days of Exercise per Week: 3 days    Minutes of Exercise per Session: 20 min  Stress: No Stress Concern Present (05/10/2023)   [employer] of Occupational Health -  Occupational Stress Questionnaire    Feeling of Stress : Only a little  Social Connections: Moderately Isolated (05/10/2023)   Social Connection and Isolation Panel [NHANES]    Frequency of Communication with Friends and Family: More than three times a week    Frequency of Social Gatherings with Friends and Family: Twice a week    Attends Religious Services: Never    Database administrator or Organizations: No    Attends Banker Meetings: Never    Marital Status: Married  Catering manager Violence: Not At Risk (05/10/2023)   Humiliation, Afraid, Rape, and Kick questionnaire    Fear of Current or Ex-Partner: No    Emotionally Abused: No    Physically Abused: No    Sexually Abused: No    Review of Systems: See HPI, otherwise negative ROS  Physical Exam: BP 126/76   Pulse 81   Temp 97.8 F (36.6 C) (Temporal)   Resp 18   Ht 5' (1.524 m)   Wt 76.7 kg   SpO2 93%   BMI 33.01 kg/m  General:   Alert, cooperative. Head:  Normocephalic and atraumatic. Respiratory:  Normal work of breathing. Cardiovascular:  NAD  Impression/Plan: Tina Berger is here for cataract surgery.  Risks, benefits, limitations, and alternatives regarding cataract surgery have been reviewed with the patient.  Questions have been answered.  All parties agreeable.   Clair Crews, MD  02/07/2024, 10:10 AM

## 2024-02-08 ENCOUNTER — Encounter: Payer: Self-pay | Admitting: Ophthalmology

## 2024-02-08 ENCOUNTER — Other Ambulatory Visit: Payer: Self-pay

## 2024-02-08 DIAGNOSIS — H2512 Age-related nuclear cataract, left eye: Secondary | ICD-10-CM | POA: Diagnosis not present

## 2024-02-13 NOTE — Anesthesia Preprocedure Evaluation (Addendum)
 Anesthesia Evaluation  Patient identified by MRN, date of birth, ID band Patient awake    Reviewed: Allergy & Precautions, H&P , NPO status , Patient's Chart, lab work & pertinent test results  Airway Mallampati: IV  TM Distance: <3 FB Neck ROM: full    Dental no notable dental hx. (+) Caps Multiple caps. crowns:   Pulmonary former smoker   Pulmonary exam normal        Cardiovascular Exercise Tolerance: Poor hypertension, + CAD  Normal cardiovascular exam(-) dysrhythmias      Neuro/Psych  PSYCHIATRIC DISORDERS         GI/Hepatic Neg liver ROS,GERD  ,,  Endo/Other  diabetes, Type 2Hypothyroidism    Renal/GU      Musculoskeletal  (+) Arthritis , Rheumatoid disorders,  Fibromyalgia -  Abdominal  (+) + obese  Peds  Hematology negative hematology ROS (+)   Anesthesia Other Findings  GERD (gastroesophageal reflux disease)HLD (hyperlipidemia) HTN (hypertension)             Hypothyroidism Osteoarthritis             Anemia, iron deficiency Diabetes mellitus type II             Edema Raynaud's disease            Fibromyalgia Dry eyes     Recurrent HSV (herpes simplex virus) HNP (herniated nucleus pulposus)             DJD (degenerative joint disease) Cervical dysplasia         Left ovarian cyst Osteopenia             Diverticulosis Hemorrhoids             Interstitial lung disease (HCC) Palpitations             Bilateral lower extremity edema Cough             Drug rash Elevated liver enzymes  Encounter for routine gynecological examination ESOPHAGITIS             GASTRITIS Colon cancer screeningHyperglycemia Other screening mammogramRhinitis Routine general medical examination at a health care facility            PERSONAL HISTORY ALLERGY UNSPEC MEDICINAL AGENT Acute renal insufficiencyDegenerative disk disease PONV (postoperative nausea and vomiting)             Coronary artery disease Dysrhythmia              Anxiety Depression             Rheumatoid arthritis (HCC) Broken wrist                   Reproductive/Obstetrics negative OB ROS                             Anesthesia Physical Anesthesia Plan  ASA: 3  Anesthesia Plan: MAC   Post-op Pain Management:    Induction: Intravenous  PONV Risk Score and Plan:   Airway Management Planned: Natural Airway and Nasal Cannula  Additional Equipment:   Intra-op Plan:   Post-operative Plan:   Informed Consent: I have reviewed the patients History and Physical, chart, labs and discussed the procedure including the risks, benefits and alternatives for the proposed anesthesia with the patient or authorized representative who has indicated his/her understanding and acceptance.       Plan Discussed with: Anesthesiologist, CRNA and Surgeon  Anesthesia Plan Comments:  Anesthesia Quick Evaluation

## 2024-02-14 ENCOUNTER — Telehealth: Payer: Self-pay | Admitting: *Deleted

## 2024-02-14 ENCOUNTER — Other Ambulatory Visit (HOSPITAL_COMMUNITY): Payer: Self-pay

## 2024-02-14 DIAGNOSIS — M0609 Rheumatoid arthritis without rheumatoid factor, multiple sites: Secondary | ICD-10-CM

## 2024-02-14 DIAGNOSIS — Z79899 Other long term (current) drug therapy: Secondary | ICD-10-CM

## 2024-02-14 MED ORDER — PREDNISONE 10 MG PO TABS: 10.0000 mg | ORAL_TABLET | Freq: Every day | ORAL | 1 refills | Status: DC

## 2024-02-14 NOTE — Telephone Encounter (Signed)
 Patient contacted the office stating she has been off her Actemra  due to it not being covered. Patient states she has also had an eye infection she has been dealing with. Patient states she had cataract surgery on 02/07/2024 and due to have the other eye done on 02/21/2024. Patient states over the weekend she was having trouble even opening her hand and could barely function. Patient states she increased her Prednisone  to 10 mg daily from 7 mg since Saturday. Patient states she is feeling better after doing so. Patient would like to know if you would send in a prescription for 10 mg until she come to the office on 03/13/2024 for her follow up.

## 2024-02-14 NOTE — Addendum Note (Signed)
 Addended by: Thais Fill on: 02/14/2024 11:55 AM   Modules accepted: Orders

## 2024-02-14 NOTE — Telephone Encounter (Signed)
 Ok to increase prednisone  to 10 mg daily.

## 2024-02-14 NOTE — Telephone Encounter (Signed)
 D/w patient. Rx for prednisone  sent to [facility] per patient request  Geraldene Kleine, PharmD, MPH, BCPS, CPP Clinical Pharmacist (Rheumatology and Pulmonology)

## 2024-02-14 NOTE — Telephone Encounter (Signed)
 Patient would like you to reach out to her to follow up.

## 2024-02-14 NOTE — Telephone Encounter (Signed)
 Received notification from [facility] regarding a prior authorization for  Tina Berger . Authorization has been APPROVED from 01/25/24 to 07/26/24. Approval letter sent to scan center.  Per test claim, copay for 28 days supply is $444.81  Patient can fill through [facility]: [phone]   Authorization # [id number]  D/w with patient. She has surgery next week and is holding Actemra  at this time. She is interested in paying towards the $2000 OOP but will d/w husband about opting into Payment Plan or paying up front. She will re-group with us  one ready to resume  Geraldene Kleine, PharmD, MPH, BCPS, CPP Clinical Pharmacist (Rheumatology and Pulmonology)

## 2024-02-16 DIAGNOSIS — G894 Chronic pain syndrome: Secondary | ICD-10-CM | POA: Diagnosis not present

## 2024-02-16 DIAGNOSIS — M4725 Other spondylosis with radiculopathy, thoracolumbar region: Secondary | ICD-10-CM | POA: Diagnosis not present

## 2024-02-16 DIAGNOSIS — M961 Postlaminectomy syndrome, not elsewhere classified: Secondary | ICD-10-CM | POA: Diagnosis not present

## 2024-02-16 DIAGNOSIS — M5134 Other intervertebral disc degeneration, thoracic region: Secondary | ICD-10-CM | POA: Diagnosis not present

## 2024-02-16 DIAGNOSIS — K5903 Drug induced constipation: Secondary | ICD-10-CM | POA: Diagnosis not present

## 2024-02-16 DIAGNOSIS — Z79891 Long term (current) use of opiate analgesic: Secondary | ICD-10-CM | POA: Diagnosis not present

## 2024-02-16 DIAGNOSIS — M5451 Vertebrogenic low back pain: Secondary | ICD-10-CM | POA: Diagnosis not present

## 2024-02-17 NOTE — Discharge Instructions (Signed)

## 2024-02-21 ENCOUNTER — Other Ambulatory Visit: Payer: Self-pay

## 2024-02-21 ENCOUNTER — Ambulatory Visit
Admission: RE | Admit: 2024-02-21 | Discharge: 2024-02-21 | Disposition: A | Discharge status: Home / Self Care | Attending: Ophthalmology | Admitting: Ophthalmology

## 2024-02-21 ENCOUNTER — Ambulatory Visit: Payer: Self-pay | Admitting: Anesthesiology

## 2024-02-21 ENCOUNTER — Encounter
Admission: RE | Disposition: A | Discharge status: Home / Self Care | Payer: Self-pay | Source: Home / Self Care | Attending: Ophthalmology

## 2024-02-21 ENCOUNTER — Encounter: Payer: Self-pay | Admitting: Ophthalmology

## 2024-02-21 DIAGNOSIS — E119 Type 2 diabetes mellitus without complications: Secondary | ICD-10-CM | POA: Insufficient documentation

## 2024-02-21 DIAGNOSIS — Z833 Family history of diabetes mellitus: Secondary | ICD-10-CM | POA: Diagnosis not present

## 2024-02-21 DIAGNOSIS — Z8249 Family history of ischemic heart disease and other diseases of the circulatory system: Secondary | ICD-10-CM | POA: Diagnosis not present

## 2024-02-21 DIAGNOSIS — M797 Fibromyalgia: Secondary | ICD-10-CM | POA: Insufficient documentation

## 2024-02-21 DIAGNOSIS — H2512 Age-related nuclear cataract, left eye: Secondary | ICD-10-CM | POA: Diagnosis not present

## 2024-02-21 DIAGNOSIS — M069 Rheumatoid arthritis, unspecified: Secondary | ICD-10-CM | POA: Diagnosis not present

## 2024-02-21 DIAGNOSIS — K219 Gastro-esophageal reflux disease without esophagitis: Secondary | ICD-10-CM | POA: Insufficient documentation

## 2024-02-21 DIAGNOSIS — E039 Hypothyroidism, unspecified: Secondary | ICD-10-CM | POA: Insufficient documentation

## 2024-02-21 DIAGNOSIS — I10 Essential (primary) hypertension: Secondary | ICD-10-CM | POA: Diagnosis not present

## 2024-02-21 DIAGNOSIS — Z7984 Long term (current) use of oral hypoglycemic drugs: Secondary | ICD-10-CM | POA: Insufficient documentation

## 2024-02-21 DIAGNOSIS — E1136 Type 2 diabetes mellitus with diabetic cataract: Secondary | ICD-10-CM | POA: Insufficient documentation

## 2024-02-21 DIAGNOSIS — Z87891 Personal history of nicotine dependence: Secondary | ICD-10-CM | POA: Insufficient documentation

## 2024-02-21 DIAGNOSIS — I2510 Atherosclerotic heart disease of native coronary artery without angina pectoris: Secondary | ICD-10-CM | POA: Diagnosis not present

## 2024-02-21 HISTORY — PX: CATARACT EXTRACTION W/PHACO: SHX586

## 2024-02-21 LAB — GLUCOSE, CAPILLARY: Glucose-Capillary: 177 mg/dL — ABNORMAL HIGH (ref 70–99)

## 2024-02-21 SURGERY — PHACOEMULSIFICATION, CATARACT, WITH IOL INSERTION
Anesthesia: Monitor Anesthesia Care | Site: Eye | Laterality: Left

## 2024-02-21 MED ORDER — MOXIFLOXACIN HCL 0.5 % OP SOLN
OPHTHALMIC | Status: DC | PRN
  Administered 2024-02-21: .2 mL via OPHTHALMIC

## 2024-02-21 MED ORDER — SIGHTPATH DOSE#1 BSS IO SOLN
INTRAOCULAR | Status: DC | PRN
  Administered 2024-02-21: 15 mL via INTRAOCULAR

## 2024-02-21 MED ORDER — FENTANYL CITRATE (PF) 100 MCG/2ML IJ SOLN
INTRAMUSCULAR | Status: DC | PRN
  Administered 2024-02-21: 50 ug via INTRAVENOUS

## 2024-02-21 MED ORDER — BRIMONIDINE TARTRATE-TIMOLOL 0.2-0.5 % OP SOLN
OPHTHALMIC | Status: DC | PRN
  Administered 2024-02-21: 1 [drp] via OPHTHALMIC

## 2024-02-21 MED ORDER — MIDAZOLAM HCL 2 MG/2ML IJ SOLN
INTRAMUSCULAR | Status: DC | PRN
  Administered 2024-02-21: 1 mg via INTRAVENOUS

## 2024-02-21 MED ORDER — SIGHTPATH DOSE#1 BSS IO SOLN
INTRAOCULAR | Status: DC | PRN
  Administered 2024-02-21: 50 mL via OPHTHALMIC

## 2024-02-21 MED ORDER — TETRACAINE HCL 0.5 % OP SOLN
OPHTHALMIC | Status: AC
  Filled 2024-02-21: qty 4

## 2024-02-21 MED ORDER — PHENYLEPHRINE HCL 10 % OP SOLN
OPHTHALMIC | Status: AC
Start: 2024-02-21 — End: ?
  Filled 2024-02-21: qty 5

## 2024-02-21 MED ORDER — TETRACAINE HCL 0.5 % OP SOLN
1.0000 [drp] | OPHTHALMIC | Status: AC
  Administered 2024-02-21 (×3): 1 [drp] via OPHTHALMIC

## 2024-02-21 MED ORDER — FENTANYL CITRATE (PF) 100 MCG/2ML IJ SOLN
INTRAMUSCULAR | Status: AC
  Filled 2024-02-21: qty 2

## 2024-02-21 MED ORDER — MIDAZOLAM HCL 2 MG/2ML IJ SOLN
INTRAMUSCULAR | Status: AC
  Filled 2024-02-21: qty 2

## 2024-02-21 MED ORDER — PHENYLEPHRINE HCL 10 % OP SOLN
1.0000 [drp] | OPHTHALMIC | Status: AC
  Administered 2024-02-21 (×3): 1 [drp] via OPHTHALMIC

## 2024-02-21 MED ORDER — LIDOCAINE HCL (PF) 2 % IJ SOLN
INTRAOCULAR | Status: DC | PRN
  Administered 2024-02-21: 2 mL

## 2024-02-21 MED ORDER — CYCLOPENTOLATE HCL 2 % OP SOLN
OPHTHALMIC | Status: AC
  Filled 2024-02-21: qty 2

## 2024-02-21 MED ORDER — SIGHTPATH DOSE#1 NA CHONDROIT SULF-NA HYALURON 40-17 MG/ML IO SOLN
INTRAOCULAR | Status: DC | PRN
Start: 2024-02-21 — End: 2024-02-21
  Administered 2024-02-21: 1 mL via INTRAOCULAR

## 2024-02-21 MED ORDER — CYCLOPENTOLATE HCL 2 % OP SOLN
1.0000 [drp] | OPHTHALMIC | Status: AC
  Administered 2024-02-21 (×3): 1 [drp] via OPHTHALMIC

## 2024-02-21 MED ORDER — ONDANSETRON HCL 4 MG/2ML IJ SOLN: 4.0000 mg | Freq: Once | INTRAMUSCULAR | Status: DC

## 2024-02-21 MED ORDER — ARMC OPHTHALMIC DILATING DROPS
OPHTHALMIC | Status: AC
  Filled 2024-02-21: qty 0.5

## 2024-02-21 SURGICAL SUPPLY — 12 items
CATARACT SUITE SIGHTPATH (MISCELLANEOUS) ×1 IMPLANT
CYSTOTOME ANGL RVRS SHRT 25G (CUTTER) ×1 IMPLANT
CYSTOTOME ANGL RVRS SHRT 25GA (CUTTER) ×1 IMPLANT
FEE CATARACT SUITE SIGHTPATH (MISCELLANEOUS) ×1 IMPLANT
GLOVE BIOGEL PI IND STRL 8 (GLOVE) ×1 IMPLANT
GLOVE SURG LX STRL 8.0 MICRO (GLOVE) ×1 IMPLANT
GLOVE SURG PROTEXIS BL SZ6.5 (GLOVE) ×1 IMPLANT
GLOVE SURG SYN 6.5 PF PI BL (GLOVE) ×1 IMPLANT
LENS IOL TECNIS EYHANCE 23.0 (Intraocular Lens) IMPLANT
NDL FILTER BLUNT 18X1 1/2 (NEEDLE) ×1 IMPLANT
NEEDLE FILTER BLUNT 18X1 1/2 (NEEDLE) ×1 IMPLANT
SYR 3ML LL SCALE MARK (SYRINGE) ×1 IMPLANT

## 2024-02-21 NOTE — Transfer of Care (Signed)
 Immediate Anesthesia Transfer of Care Note  Patient: Tina Berger  Procedure(s) Performed: PHACOEMULSIFICATION, CATARACT, WITH IOL INSERTION 5.42 00:40.0 (Left: Eye)  Patient Location: PACU  Anesthesia Type: MAC  Level of Consciousness: awake, alert  and patient cooperative  Airway and Oxygen Therapy: Patient Spontanous Breathing and Patient connected to supplemental oxygen  Post-op Assessment: Post-op Vital signs reviewed, Patient's Cardiovascular Status Stable, Respiratory Function Stable, Patent Airway and No signs of Nausea or vomiting  Post-op Vital Signs: Reviewed and stable  Complications: No notable events documented.

## 2024-02-21 NOTE — H&P (Signed)
 [facility]   Primary Care Physician:  Tower, Manley Seeds, MD Ophthalmologist: Dr. Jeb Miner  Pre-Procedure History & Physical: HPI:  Tina Berger is a 73 y.o. female here for cataract surgery.   Past Medical History:  Diagnosis Date   Acute renal insufficiency 05/31/2014   pt not aware of this diagnosis   Anemia, iron deficiency    Anxiety    Bilateral lower extremity edema    a. uses torsemide    Broken wrist    did not require surgery   Cervical dysplasia    abnormal paps   Colon cancer screening 06/14/2014   Coronary artery disease    Cough 08/08/2012   Degenerative disk disease 11/19/2011   Depression    Diabetes mellitus type II    Diverticulosis    DJD (degenerative joint disease)    Drug rash 05/22/2011   Dry eyes    Dysrhythmia    metoprolol .   Edema    Elevated liver enzymes 03/21/2012   Encounter for routine gynecological examination 06/14/2014   ESOPHAGITIS 03/28/2007   Qualifier: Hospitalized for  By: Lennice Quivers CMA, Kelly Patient     Fibromyalgia    GASTRITIS 03/28/2007   Qualifier: History of  By: Lennice Quivers CMA, Kelly Patient     GERD (gastroesophageal reflux disease)    Hemorrhoids    external   HLD (hyperlipidemia)    HNP (herniated nucleus pulposus) 11/1997   T6,7,8 with DJD   HTN (hypertension)    Hyperglycemia 05/13/2008   Qualifier: Diagnosis of  By: Malissa Se MD, Dannette Dutch    Hypothyroidism    Interstitial lung disease (HCC)    pt not aware of this   Left ovarian cyst    x 3, rupture   Osteoarthritis    hands   Osteopenia    mild-11/01; improved 12/05   Other screening mammogram 08/18/2011   Palpitations    PERSONAL HISTORY ALLERGY UNSPEC MEDICINAL AGENT 03/18/2010   Qualifier: Diagnosis of  By: Malissa Se MD, Dannette Dutch    PONV (postoperative nausea and vomiting)    Raynaud's disease    Recurrent HSV (herpes simplex virus)    lesions in nose or mouth with frequent ulcers. d/t meds and dry mouth   Rheumatoid arthritis (HCC)    Rhinitis  03/21/2012   Routine general medical examination at a health care facility 03/28/2011    Past Surgical History:  Procedure Laterality Date   ABDOMINAL EXPLORATION SURGERY     ACDF  1991   BIOPSY OF SKIN SUBCUTANEOUS TISSUE AND/OR MUCOUS MEMBRANE Left 04/08/2021   Procedure: BIOPSY OF SKIN SUBCUTANEOUS TISSUE AND/OR MUCOUS MEMBRANE ( REMOVAL SKIN NODULE);  Surgeon: Jackquelyn Mass, MD;  Location: [facility];  Service: Vascular;  Laterality: Left;   CATARACT EXTRACTION W/PHACO Right 02/07/2024   Procedure: PHACOEMULSIFICATION, CATARACT, WITH IOL INSERTION 8.20 00:48.7;  Surgeon: Clair Crews, MD;  Location: [facility];  Service: Ophthalmology;  Laterality: Right;   COLONOSCOPY  08/2000   Diverticulosis; hemorrhoids   HAND SURGERY Left    left thumb. PIN REMOVED   LASIK     bilateral    Prior to Admission medications   Medication Sig Start Date End Date Taking? Authorizing Provider  Acetaminophen  (TYLENOL  ARTHRITIS PAIN PO) Take 650 mg by mouth daily.   Yes [provider]  buPROPion  (WELLBUTRIN  XL) 150 MG 24 hr tablet TAKE 1 TABLET BY MOUTH DAILY 11/29/23  Yes Tower, Manley Seeds, MD  Chlorphen-Phenyleph-ASA (ALKA-SELTZER PLUS COLD PO) Take by mouth as needed. Just  for colds   Yes [provider]  cholecalciferol (VITAMIN D3) 25 MCG (1000 UNIT) tablet Take 1,000 Units by mouth daily.   Yes [provider]  cyclobenzaprine  (FLEXERIL ) 5 MG tablet Take 5 mg by mouth daily as needed. 03/17/22  Yes [provider]  DULoxetine  (CYMBALTA ) 60 MG capsule TAKE 1 CAPSULE BY MOUTH EVERY DAY 10/28/23  Yes Tower, Manley Seeds, MD  esomeprazole  (NEXIUM ) 20 MG capsule TAKE 1 CAPSULE BY MOUTH DAILY 11/29/23  Yes Tower, Manley Seeds, MD  famotidine  (PEPCID ) 40 MG tablet TAKE 1 TABLET BY MOUTH DAILY 11/29/23  Yes Tower, Marne A, MD  ferrous sulfate 325 (65 FE) MG EC tablet Take 325 mg by mouth daily with breakfast.   Yes [provider]  GUAIFENESIN CR PO Take by mouth  as needed.   Yes [provider]  HYDROmorphone HCl (EXALGO) 12 MG T24A SR tablet Take 12 mg by mouth 2 (two) times daily.   Yes [provider]  hyoscyamine  (LEVSIN SL) 0.125 MG SL tablet Take 1 tablet (0.125 mg total) by mouth 2 (two) times daily as needed. 10/28/23  Yes Tower, Manley Seeds, MD  levothyroxine  (SYNTHROID ) 125 MCG tablet TAKE 1 TABLET BY MOUTH DAILY  BEFORE BREAKFAST 11/28/23  Yes Tower, Manley Seeds, MD  LYSINE PO Take by mouth as needed. OTC for ulcer prophylaxis   Yes [provider]  Magnesium  400 MG CAPS Take by mouth daily.   Yes [provider]  metFORMIN  (GLUCOPHAGE -XR) 500 MG 24 hr tablet TAKE 2 TABLETS BY MOUTH TWICE  DAILY WITH A MEAL 11/29/23  Yes Tower, Manley Seeds, MD  metoprolol  succinate (TOPROL -XL) 50 MG 24 hr tablet TAKE 1 TABLET BY MOUTH TWICE  DAILY 11/29/23  Yes Tower, Manley Seeds, MD  ondansetron  (ZOFRAN ) 4 MG tablet Take 1 tablet (4 mg total) by mouth every 8 (eight) hours as needed for nausea or vomiting. 10/05/22  Yes Romayne Clubs, PA-C  oxyCODONE  (OXYCONTIN ) 10 MG 12 hr tablet Take 10 mg by mouth 3 (three) times daily as needed. Beakthrough pain   Yes [provider]  predniSONE  (DELTASONE ) 10 MG tablet Take 1 tablet (10 mg total) by mouth daily with breakfast. 02/14/24  Yes Romayne Clubs, PA-C  ramipril  (ALTACE ) 5 MG capsule TAKE 1 CAPSULE BY MOUTH DAILY 11/29/23  Yes Tower, Manley Seeds, MD  rosuvastatin  (CRESTOR ) 5 MG tablet TAKE 1 TABLET BY MOUTH TWICE  WEEKLY 09/19/23  Yes Tower, Marne A, MD  tiZANidine (ZANAFLEX) 4 MG tablet Take 4 mg by mouth every 8 (eight) hours as needed for muscle spasms. 10/26/22  Yes [provider]  vitamin B-12 (CYANOCOBALAMIN ) 500 MCG tablet Take 500 mcg by mouth daily.   Yes [provider]  Diclofenac  Sodium 1.5 % SOLN Place 2 mLs onto the skin 4 (four) times daily. 08/15/15   Tower, Manley Seeds, MD  glucose blood test strip One Touch Ultra stripts blue-To check sugar once daily and as needed  for DM2 250.00 03/23/22   Tower, Mitchell Ana A, MD  ketoconazole (NIZORAL) 2 % shampoo SHAMPOO WITH A SMALL AMOUNT AS DIRECTED THREE TIMES A WEEK SHAMPOO SCALP 3 DAYS A WEEK, LET SHAMPOO SIT FOR 10 MINUTES AND RINSE OFF 06/16/20   Moye, Virginia , MD  naloxone (NARCAN) nasal spray 4 mg/0.1 mL  01/20/22   [provider]  Tocilizumab  (ACTEMRA  ACTPEN) 162 MG/0.9ML SOAJ Inject 162 mg into the skin every 14 (fourteen) days. 12/12/23   Nicholas Bari, MD  Xylitol (  XYLIMELTS MT) Use as directed in the mouth or throat.    [provider]    Allergies as of 01/25/2024 - Review Complete 12/29/2023  Allergen Reaction Noted   Ceftin  [cefuroxime ] Swelling 06/01/2012   Clonidine  derivatives  08/18/2011   Erythromycin Swelling 03/28/2007   Keflex  [cephalexin ] Anaphylaxis 06/01/2012   Penicillins Anaphylaxis 03/28/2007   Sulfonamide derivatives Swelling 03/28/2007   Ciprofloxacin  03/18/2010   Fluoxetine hcl  06/14/2008   Furosemide Swelling 03/28/2007   Gabapentin Other (See Comments) 03/28/2007   Hydroxychloroquine  Itching 05/04/2022   Paroxetine  06/14/2008   Pregabalin  06/14/2008   Tetracycline  03/28/2007   Venlafaxine  06/14/2008   Etodolac  03/28/2007   Macrobid  [nitrofurantoin ]  03/18/2010   Amitriptyline hcl  06/14/2008   Atorvastatin  08/25/2009   Benicar  [olmesartan  medoxomil]  08/18/2011   Cephalexin  Hives and Rash 05/21/2011   Cetirizine hcl  03/28/2007   Diovan [valsartan]  07/07/2012   Naproxen sodium Other (See Comments) 05/24/2007    Family History  Problem Relation Age of Onset   Lung cancer Mother        + smoker   Coronary artery disease Mother        relatively young   Emphysema Father        + smoker   Lymphoma Sister    Heart disease Brother    Lymphoma Brother    Parkinson's disease Brother    Anemia Brother        aplastic    Diabetes Brother    Iron deficiency Daughter    Leukemia Son    Breast cancer Neg Hx     Social History    Socioeconomic History   Marital status: Married    Spouse name: Ronnie   Number of children: 1   Years of education: Not on file   Highest education level: Not on file  Occupational History   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 [employer]    Employer: [employer]    Comment: retired  Tobacco Use   Smoking status: Former    Current packs/day: 0.00    Average packs/day: 1 pack/day for 5.0 years (5.0 ttl pk-yrs)    Types: Cigarettes    Start date: 10/19/1963    Quit date: 10/18/1968    Years since quitting: 55.3    Passive exposure: Never   Smokeless tobacco: Never   Tobacco comments:    quit over 40 years  Vaping Use   Vaping status: Never Used  Substance and Sexual Activity   Alcohol use: No    Alcohol/week: 0.0 standard drinks of alcohol   Drug use: No   Sexual activity: Yes  Other Topics Concern   Not on file  Social History Narrative   Married1 childSecretary to dean at [employer] regularly exerciseDaily caffeine use: 2/day.   Lives with husband. Feels safe in her home.   Social Drivers of Corporate investment banker Strain: Low Risk  (05/10/2023)   Overall Financial Resource Strain (CARDIA)    Difficulty of Paying Living Expenses: Not hard at all  Food Insecurity: No Food Insecurity (05/10/2023)   Hunger Vital Sign    Worried About Running Out of Food in the Last Year: Never true    Ran Out of Food in the Last Year: Never true  Transportation Needs: No Transportation Needs (05/10/2023)   PRAPARE - Administrator, Civil Service (Medical): No    Lack of Transportation (Non-Medical): No  Physical Activity: Insufficiently Active (05/10/2023)  Exercise Vital Sign    Days of Exercise per Week: 3 days    Minutes of Exercise per Session: 20 min  Stress: No Stress Concern Present (05/10/2023)   [employer] of Occupational Health - Occupational Stress Questionnaire    Feeling of Stress : Only a little  Social Connections: Moderately Isolated (05/10/2023)   Social  Connection and Isolation Panel [NHANES]    Frequency of Communication with Friends and Family: More than three times a week    Frequency of Social Gatherings with Friends and Family: Twice a week    Attends Religious Services: Never    Database administrator or Organizations: No    Attends Banker Meetings: Never    Marital Status: Married  Catering manager Violence: Not At Risk (05/10/2023)   Humiliation, Afraid, Rape, and Kick questionnaire    Fear of Current or Ex-Partner: No    Emotionally Abused: No    Physically Abused: No    Sexually Abused: No    Review of Systems: See HPI, otherwise negative ROS  Physical Exam: BP (!) 140/78   Pulse 75   Temp (!) 97 F (36.1 C)   Wt 76.2 kg   SpO2 93%   BMI 32.81 kg/m  General:   Alert, cooperative. Head:  Normocephalic and atraumatic. Respiratory:  Normal work of breathing. Cardiovascular:  NAD  Impression/Plan: Justis K Palau is here for cataract surgery.  Risks, benefits, limitations, and alternatives regarding cataract surgery have been reviewed with the patient.  Questions have been answered.  All parties agreeable.   Clair Crews, MD  02/21/2024, 10:25 AM

## 2024-02-21 NOTE — Anesthesia Postprocedure Evaluation (Signed)
 Anesthesia Post Note  Patient: Tina Berger  Procedure(s) Performed: PHACOEMULSIFICATION, CATARACT, WITH IOL INSERTION 5.42 00:40.0 (Left: Eye)  Patient location during evaluation: PACU Anesthesia Type: MAC Level of consciousness: awake and alert Pain management: pain level controlled Vital Signs Assessment: post-procedure vital signs reviewed and stable Respiratory status: spontaneous breathing, nonlabored ventilation and respiratory function stable Cardiovascular status: stable and blood pressure returned to baseline Postop Assessment: no apparent nausea or vomiting Anesthetic complications: no   No notable events documented.   Last Vitals:  Vitals:   02/21/24 1050 02/21/24 1054  BP: 125/82 123/79  Pulse: 73 72  Resp: 15 14  Temp: (!) 36.2 C (!) 36.2 C  SpO2: 90% 90%    Last Pain:  Vitals:   02/21/24 1054  PainSc: 0-No pain                 Baltazar Bonier

## 2024-02-21 NOTE — Op Note (Signed)
 PREOPERATIVE DIAGNOSIS:  Nuclear sclerotic cataract of the left eye.   POSTOPERATIVE DIAGNOSIS:  Nuclear sclerotic cataract of the left eye.   OPERATIVE PROCEDURE:ORPROCALL@   SURGEON:  Clair Crews, MD.   ANESTHESIA:  Anesthesiologist: Baltazar Bonier, MD CRNA: Sherrlyn Dolores, CRNA  1.      Managed anesthesia care. 2.     0.38ml of Shugarcaine was instilled following the paracentesis   COMPLICATIONS:  None.   TECHNIQUE:   Stop and chop   DESCRIPTION OF PROCEDURE:  The patient was examined and consented in the preoperative holding area where the aforementioned topical anesthesia was applied to the left eye and then brought back to the Operating Room where the left eye was prepped and draped in the usual sterile ophthalmic fashion and a lid speculum was placed. A paracentesis was created with the side port blade and the anterior chamber was filled with viscoelastic. A near clear corneal incision was performed with the steel keratome. A continuous curvilinear capsulorrhexis was performed with a cystotome followed by the capsulorrhexis forceps. Hydrodissection and hydrodelineation were carried out with BSS on a blunt cannula. The lens was removed in a stop and chop  technique and the remaining cortical material was removed with the irrigation-aspiration handpiece. The capsular bag was inflated with viscoelastic and the Technis [id number] lens was placed in the capsular bag without complication. The remaining viscoelastic was removed from the eye with the irrigation-aspiration handpiece. The wounds were hydrated. The anterior chamber was flushed with BSS and the eye was inflated to physiologic pressure. 0.48ml Vigamox  was placed in the anterior chamber. The wounds were found to be water tight. The eye was dressed with Combigan . The patient was given protective glasses to wear throughout the day and a shield with which to sleep tonight. The patient was also given drops with which to begin a drop  regimen today and will follow-up with me in one day. Implant Name Type Inv. Item Serial No. Manufacturer Lot No. LRB No. Used Action  LENS IOL TECNIS EYHANCE 23.0 - [id number] Intraocular Lens LENS IOL TECNIS EYHANCE 23.0 [id number] S[employer]  Left 1 Implanted    Procedure(s): PHACOEMULSIFICATION, CATARACT, WITH IOL INSERTION 5.42 00:40.0 (Left)  Electronically signed: Clair Crews 02/21/2024 10:48 AM

## 2024-02-28 NOTE — Progress Notes (Unsigned)
 Office Visit Note  Patient: Tina Berger             Date of Birth: 1951/02/16           MRN: [id number]             PCP: Clemens Curt, MD Referring: Tower, Manley Seeds, MD Visit Date: 03/13/2024 Occupation: @GUAROCC @  Subjective:  Left shoulder pain  History of Present Illness: Tina Berger is a 73 y.o. female with history of seronegative rheumatoid arthritis and ILD.  Patient is currently on prednisone  10 mg daily.  Patient underwent right eye cataract removal on 02/07/2024 in the left eye was performed on 02/21/2024.  She has not had any signs of delayed healing or complications.  She denies any recent or recurrent infections.  She is planning to receive clearance by her ophthalmologist tomorrow in order to reinitiate tocilizumab  injections.  Patient presents today with increased pain in the left shoulder as well as increased pain and inflammation in her right wrist and both hands.  Patient is hoping to reduce the dose of prednisone  once she has been able to resume tocilizumab .  Patient continues to have symptoms of chronic dry eyes and mouth dryness.  Patient has been started on meibo eye drops daily and is using xylimelts as needed.    Activities of Daily Living:  Patient reports morning stiffness for 2-3 hours.   Patient Reports nocturnal pain.  Difficulty dressing/grooming: Reports Difficulty climbing stairs: Denies Difficulty getting out of chair: Denies Difficulty using hands for taps, buttons, cutlery, and/or writing: Reports  Review of Systems  Constitutional:  Positive for fatigue.  HENT:  Positive for mouth sores and mouth dryness.   Eyes:  Positive for dryness.  Respiratory:  Negative for shortness of breath.   Cardiovascular:  Negative for chest pain and palpitations.  Gastrointestinal:  Positive for constipation. Negative for blood in stool and diarrhea.  Endocrine: Negative for increased urination.  Genitourinary:  Negative for involuntary urination.   Musculoskeletal:  Positive for joint pain, joint pain, joint swelling, myalgias, muscle weakness, morning stiffness, muscle tenderness and myalgias. Negative for gait problem.  Skin:  Positive for color change. Negative for rash, hair loss and sensitivity to sunlight.  Allergic/Immunologic: Negative for susceptible to infections.  Neurological:  Negative for dizziness and headaches.  Hematological:  Negative for swollen glands.  Psychiatric/Behavioral:  Positive for sleep disturbance. Negative for depressed mood. The patient is not nervous/anxious.     PMFS History:  Patient Active Problem List   Diagnosis Date Noted   Aortic atherosclerosis (HCC) 09/12/2023   Skin lesion 09/12/2023   Frequent falls 12/29/2022   Dysuria 09/07/2021   Rheumatoid arthritis (HCC) 05/27/2021   Chronic venous insufficiency 05/23/2021   AVM (arteriovenous malformation) 04/04/2021   CAD (coronary artery disease) 01/22/2021   Primary osteoarthritis of both knees 01/12/2021   Positive ANA (antinuclear antibody) 11/17/2020   Joint pain 11/13/2020   Current use of proton pump inhibitor 08/26/2020   Estrogen deficiency 08/21/2018   TMJ (dislocation of temporomandibular joint), initial encounter 05/05/2018   Anterior neck pain 05/05/2018   Chronic pain syndrome 11/09/2017   Chronic upper extremity pain [facility] Area of Pain) (Bilateral) (L>R) 11/09/2017   Fibromyalgia syndrome 11/09/2017   Osteoarthritis 11/09/2017   Osteoarthritis of lumbar facet joint (Bilateral) 11/09/2017   Lumbar facet arthropathy (Bilateral) 11/09/2017   Lumbar facet syndrome (Bilateral) (L>R) 11/09/2017   Lumbar foraminal stenosis (multilevel) (Bilateral) 11/09/2017   DDD (degenerative disc disease), lumbar 11/09/2017  Thoracic levoscoliosis 11/09/2017   Thoracic facet syndrome (Bilateral) (L>R) 11/09/2017   Thoracic facet arthropathy (Bilateral) (R>L) 11/09/2017   DDD (degenerative disc disease), thoracic 11/09/2017    Thoracolumbar IVDD 11/09/2017   DDD (degenerative disc disease), cervical 11/09/2017   Osteoarthritis of cervical facet (Bilateral) 11/09/2017   Grade 1 Anterolisthesis of C7 over T1 11/09/2017   Cervical foraminal stenosis (Bilateral) 11/09/2017   History of fusion of cervical spine (C5-6 ACDF) 11/09/2017   Cervical facet syndrome (Bilateral) 11/09/2017   Disorder of skeletal system 11/09/2017   Cervical radiculitis (Bilateral) 11/09/2017   Lumbar Epidural lipomatosis 11/09/2017   Chronic sacroiliac joint pain (Bilateral) (L>R) 11/09/2017   Chronic hip pain (Bilateral) (L>R) 11/09/2017   Chronic upper back pain (Primary Area of Pain) (midline) 10/24/2017   Chronic neck pain (Secondary Area of Pain) (Bilateral)  (L>R) 10/24/2017   Chronic low back pain (Fourth Area of Pain) (Bilateral) (L>R) 10/24/2017   Chronic lower extremity pain (Fifth Area of Pain) (Bilateral) (L>R) 10/24/2017   Lumbar Grade 1 Retrolisthesis of L1-2 and L2-3 10/24/2017   Other long term (current) drug therapy 10/24/2017   Other specified health status 10/24/2017   Long term current use of opiate analgesic 10/24/2017   Long term prescription opiate use 10/24/2017   Opiate use 10/24/2017   DM type 2 (diabetes mellitus, type 2) (HCC) 06/14/2014   Pharmacologic therapy 06/14/2014   Pedal edema    Post-menopausal 06/23/2012   Lumbar disc disease with radiculopathy 11/18/2011   HTN (hypertension) 07/30/2011   Raynaud disease 07/30/2011   Obesity 07/30/2011   Routine general medical examination at a health care facility 03/28/2011   Palpitations 04/27/2010   Depression with anxiety 06/14/2008   Vitamin D  deficiency 05/13/2008   Osteopenia 03/25/2008   Hypothyroidism 03/28/2007   Hyperlipidemia associated with type 2 diabetes mellitus (HCC) 03/28/2007   Other iron deficiency anemias 03/28/2007   PANIC ATTACK 03/28/2007   KERATOCONJUNCTIVITIS SICCA 03/28/2007   Mitral valve disorder 03/28/2007   GERD 03/28/2007    IBS 03/28/2007   Rosacea 03/28/2007   PLANTAR FASCIITIS 03/28/2007   MIGRAINES, HX OF 03/28/2007    Past Medical History:  Diagnosis Date   Acute renal insufficiency 05/31/2014   pt not aware of this diagnosis   Anemia, iron deficiency    Anxiety    Bilateral lower extremity edema    a. uses torsemide    Broken wrist    did not require surgery   Cervical dysplasia    abnormal paps   Colon cancer screening 06/14/2014   Coronary artery disease    Cough 08/08/2012   Degenerative disk disease 11/19/2011   Depression    Diabetes mellitus type II    Diverticulosis    DJD (degenerative joint disease)    Drug rash 05/22/2011   Dry eyes    Dysrhythmia    metoprolol .   Edema    Elevated liver enzymes 03/21/2012   Encounter for routine gynecological examination 06/14/2014   ESOPHAGITIS 03/28/2007   Qualifier: Hospitalized for  By: Lennice Quivers CMA, Kelly Patient     Fibromyalgia    GASTRITIS 03/28/2007   Qualifier: History of  By: Lennice Quivers CMA, Kelly Patient     GERD (gastroesophageal reflux disease)    Hemorrhoids    external   HLD (hyperlipidemia)    HNP (herniated nucleus pulposus) 11/1997   T6,7,8 with DJD   HTN (hypertension)    Hyperglycemia 05/13/2008   Qualifier: Diagnosis of  By: Malissa Se MD, Dannette Dutch    Hypothyroidism  Interstitial lung disease (HCC)    pt not aware of this   Left ovarian cyst    x 3, rupture   Osteoarthritis    hands   Osteopenia    mild-11/01; improved 12/05   Other screening mammogram 08/18/2011   Palpitations    PERSONAL HISTORY ALLERGY UNSPEC MEDICINAL AGENT 03/18/2010   Qualifier: Diagnosis of  By: Malissa Se MD, Dannette Dutch    PONV (postoperative nausea and vomiting)    Raynaud's disease    Recurrent HSV (herpes simplex virus)    lesions in nose or mouth with frequent ulcers. d/t meds and dry mouth   Rheumatoid arthritis (HCC)    Rhinitis 03/21/2012   Routine general medical examination at a health care facility 03/28/2011    Family History   Problem Relation Age of Onset   Lung cancer Mother        + smoker   Coronary artery disease Mother        relatively young   Emphysema Father        + smoker   Lymphoma Sister    Heart disease Brother    Lymphoma Brother    Parkinson's disease Brother    Anemia Brother        aplastic    Diabetes Brother    Iron deficiency Daughter    Leukemia Son    Breast cancer Neg Hx    Past Surgical History:  Procedure Laterality Date   ABDOMINAL EXPLORATION SURGERY     ACDF  1991   BIOPSY OF SKIN SUBCUTANEOUS TISSUE AND/OR MUCOUS MEMBRANE Left 04/08/2021   Procedure: BIOPSY OF SKIN SUBCUTANEOUS TISSUE AND/OR MUCOUS MEMBRANE ( REMOVAL SKIN NODULE);  Surgeon: Jackquelyn Mass, MD;  Location: [facility];  Service: Vascular;  Laterality: Left;   CATARACT EXTRACTION W/PHACO Right 02/07/2024   Procedure: PHACOEMULSIFICATION, CATARACT, WITH IOL INSERTION 8.20 00:48.7;  Surgeon: Clair Crews, MD;  Location: [facility];  Service: Ophthalmology;  Laterality: Right;   CATARACT EXTRACTION W/PHACO Left 02/21/2024   Procedure: PHACOEMULSIFICATION, CATARACT, WITH IOL INSERTION 5.42 00:40.0;  Surgeon: Clair Crews, MD;  Location: [facility];  Service: Ophthalmology;  Laterality: Left;   COLONOSCOPY  08/2000   Diverticulosis; hemorrhoids   HAND SURGERY Left    left thumb. 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   Lives with husband. Feels safe in her home.   Immunization History  Administered Date(s) Administered   Fluad Quad(high Dose 65+) 08/07/2020, 07/13/2022   Fluad Trivalent(High Dose 65+) 06/27/2023   Influenza Split 06/19/2011   Influenza Whole 08/01/2009   Influenza, High Dose Seasonal PF 08/14/2018, 07/03/2019   Influenza,inj,Quad PF,6+ Mos 06/14/2014, 08/25/2015, 07/26/2016, 08/19/2017   Moderna Sars-Covid-2 Vaccination 09/17/2020    PFIZER(Purple Top)SARS-COV-2 Vaccination 11/27/2019, 12/18/2019   Pneumococcal Conjugate-13 08/25/2015   Pneumococcal Polysaccharide-23 08/25/2016   Td 11/25/2003   Tdap 06/14/2014   Zoster, Live 09/16/2014     Objective: Vital Signs: BP 132/82 (BP Location: Left Arm, Patient Position: Sitting, Cuff Size: Normal)   Pulse 85   Resp 15   Ht 5' (1.524 m)   Wt 171 lb (77.6 kg)   BMI 33.40 kg/m    Physical Exam Vitals and nursing note reviewed.  Constitutional:      Appearance: She is well-developed.  HENT:     Head: Normocephalic and atraumatic.  Eyes:  Conjunctiva/sclera: Conjunctivae normal.  Cardiovascular:     Rate and Rhythm: Normal rate and regular rhythm.     Heart sounds: Normal heart sounds.  Pulmonary:     Effort: Pulmonary effort is normal.     Breath sounds: Normal breath sounds.  Abdominal:     General: Bowel sounds are normal.     Palpations: Abdomen is soft.  Musculoskeletal:     Cervical back: Normal range of motion.  Lymphadenopathy:     Cervical: No cervical adenopathy.  Skin:    General: Skin is warm and dry.     Capillary Refill: Capillary refill takes less than 2 seconds.  Neurological:     Mental Status: She is alert and oriented to person, place, and time.  Psychiatric:        Behavior: Behavior normal.      Musculoskeletal Exam: C-spine has limited ROM with lateral rotation.  Thoracic kyphosis noted.  Right shoulder has full ROM.  Left shoulder has painful forward flexion and abduction.  Limited and painful internal rotation of the left shoulder. Posterior tenderness of the left shoulder.  Elbow joints have good range of motion.  Synovial thickening of both wrist joints with tenderness and inflammation in the right wrist.  Synovial thickening of all MCP joints.  Tenderness and synovitis of the right second MCP joint and right 2nd and 3rd PIP joints. Hip joints have good assistance at a position.  Bilateral knee joints have good range of  motion with no warmth or effusion.  Ankle joints have good range of motion with no tenderness or joint swelling.  CDAI Exam: CDAI Score: -- Patient Global: --; Provider Global: -- Swollen: 4 ; Tender: 5  Joint Exam 03/13/2024      Right  Left  Glenohumeral      Tender  Wrist  Swollen Tender     MCP 2  Swollen Tender     PIP 2 (finger)  Swollen Tender     PIP 3 (finger)  Swollen Tender        Investigation: No additional findings.  Imaging: No results found.  Recent Labs: Lab Results  Component Value Date   WBC 8.2 12/14/2023   HGB 13.7 12/14/2023   PLT 407 (H) 12/14/2023   NA 137 12/14/2023   K 5.3 12/14/2023   CL 96 (L) 12/14/2023   CO2 32 12/14/2023   GLUCOSE 115 (H) 12/14/2023   BUN 21 12/14/2023   CREATININE 0.87 12/14/2023   BILITOT 0.3 12/14/2023   ALKPHOS 48 09/05/2023   AST 15 12/14/2023   ALT 12 12/14/2023   PROT 7.3 12/14/2023   ALBUMIN 4.0 09/05/2023   CALCIUM  9.4 12/14/2023   GFRAA 75 03/24/2021   QFTBGOLDPLUS NEGATIVE 03/22/2023    Speciality Comments: Arava - mood disorder  Procedures:  Large Joint Inj: L glenohumeral on 03/13/2024 3:52 PM Indications: pain Details: 27 G 1.5 in needle, posterior approach  Arthrogram: No  Medications: 1 mL lidocaine  1 %; 40 mg triamcinolone  acetonide 40 MG/ML Aspirate: 0 mL Outcome: tolerated well, no immediate complications Procedure, treatment alternatives, risks and benefits explained, specific risks discussed. Consent was given by the patient. Immediately prior to procedure a time out was called to verify the correct patient, procedure, equipment, support staff and site/side marked as required. Patient was prepped and draped in the usual sterile fashion.     Allergies: Ceftin  [cefuroxime ], Clonidine  derivatives, Erythromycin, Keflex  [cephalexin ], Penicillins, Sulfonamide derivatives, Ciprofloxacin, Fluoxetine hcl, Furosemide, Gabapentin, Hydroxychloroquine , Paroxetine, Pregabalin, Tetracycline,  Venlafaxine, Etodolac, Macrobid  [  nitrofurantoin ], Amitriptyline hcl, Atorvastatin, Benicar  [olmesartan  medoxomil], Cephalexin , Cetirizine hcl, Diovan [valsartan], and Naproxen sodium    Assessment / Plan:     Visit Diagnoses: Rheumatoid arthritis of multiple sites with negative rheumatoid factor [facility]): Patient presents today experiencing increased pain and inflammation primarily involving the right wrist, right hand and discomfort in the left shoulder.  She is currently taking prednisone  10 mg daily.  She has been off of Actemra  since March 2025 due to a right eye infection followed by having bilateral cataract extraction on 02/07/2024 (right) and 02/21/2024 (left).  She will be following up with ophthalmology tomorrow for full clearance to resume tocilizumab  since there has been no delayed healing or complications.  She will resume tocilizumab  162 mg sq injections every 14 days and prednisone  10 mg daily.  Once her symptoms start to improve we will then try tapering prednisone  by 1 mg every 2 months as tolerated.  She will notify us  if she develops any new or worsening symptoms. To alleviate her current discomfort a left glenohumeral joint injection was performed today.  She tolerated the procedure well and the procedure note was completed above. She will follow-up in the office in 3 months or sooner if needed.  High risk medication use - Tocilizumab  162 mg subcutaneous injections every 14 days and prednisone  10 mg daily.  Plan to try tapering prednisone  by 1 mg every 2 months.  CBC and CMP updated on 12/14/23.  Orders for CBC and CMP released today.  TB gold negative on 03/22/23. Order for TB gold released today.  Lipid panel updated on 09/05/23.  No recurrent infections.  Discussed the importance of holding actemra  if she develops signs or symptoms of an infection and to resume once the infection has completely cleared.   - Plan: CBC with Differential/Platelet, Comprehensive metabolic panel with GFR,  QuantiFERON-TB Gold Plus  Screening for tuberculosis - Order for TB gold released today.  Plan: QuantiFERON-TB Gold Plus  ILD (interstitial lung disease) (HCC) - Evaluated by Dr. Bertrum Brodie on 03/01/2023: Very mild ILD: based on symptoms, walk test, pulmonary function test and CT scan. High-resolution chest CT on 12/27/2023: Spectrum of findings compatible with mild to moderate basilar predominant fibrotic interstitial lung disease with small focus of honeycombing, slightly progressed since 01/16/2021. No new or worsening pulmonary symptoms.   She is currently taking prednisone  10 mg daily and is prescribed tocilizumab  but has had a gap in therapy since March 2025.  She will be resuming tocilizumab  once she receives a refill and clearance by ophthalmology. Patient is scheduled to see Dr. Bertrum Brodie on 04/09/2024.  Raynaud's disease without gangrene: Primarily affecting both feet.  Chronic left shoulder pain -Patient presents today with ongoing left shoulder pain.  Patient had a fall about 1 year ago which exacerbated her shoulder discomfort.  She previously had x-rays of the left shoulder on 02/03/2022 which revealed prominent subarticular space at the fundal head articulation.  Plan to update x-rays of the left shoulder today for further evaluation.  Different treatment options were discussed today.  Patient requested a left glenohumeral joint cortisone injection.  She tolerated procedure well.  Procedure note was completed above.  Aftercare was discussed.  She was advised to notify us  if her symptoms persist or worsen at which time a referral to orthopedics can be placed for further evaluation and treatment.  Discussed the option of physical therapy to help improve her range of motion if needed.  Plan: XR Shoulder Left, Large Joint Inj: L glenohumeral  Primary  osteoarthritis of both knees: No warmth or effusion noted.   DDD (degenerative disc disease), cervical: Limited ROM with lateral rotation.    History of fusion of cervical spine (C5-6 ACDF): Limited ROM.   DDD (degenerative disc disease), thoracic: Thoracic kyphosis noted.   Spondylosis of lumbar spine: Limited ROM of lumbar spine.   Fibromyalgia syndrome: She remains on Cymbalta  as prescribed.  She takes Flexeril  as needed for muscle spasms.  She remains under the care of pain management.  Chronic pain syndrome: She remains under the care of pain management.  Long term current use of opiate analgesic: Under the care of pain management.    Osteopenia of multiple sites - DEXA 04/19/2023 T-score -2.1 right total femur, BMD 0.745. History of vertebral fracture and wrist fracture---would benefit from use of anablic agent.  On long term systemic prednisone .  She is taking vitamin D  1000 units daily. Due to update DEXA in July 2026.   History of vertebral fracture: Patient would benefit from the use of an anabolic agent.    History of wrist fracture - Left-fall 02/13/23.  Vitamin D  deficiency: She is taking vitamin D  1000 units daily.   Other medical conditions are listed as follows:   Chronic renal impairment, stage 3a (HCC)  Type 2 diabetes mellitus without complication, without long-term current use of insulin (HCC)  Depression with anxiety  History of hypothyroidism  History of IBS  Primary hypertension: BP was 132/82 today in the office.   Hx of arteriovenous malformation (AVM)  Mitral valve disorder  History of gastroesophageal reflux (GERD)  Venous ulcer of left leg (HCC)  Hyperlipidemia associated with type 2 diabetes mellitus (HCC)    Orders: Orders Placed This Encounter  Procedures   Large Joint Inj: L glenohumeral   XR Shoulder Left   CBC with Differential/Platelet   Comprehensive metabolic panel with GFR   QuantiFERON-TB Gold Plus   No orders of the defined types were placed in this encounter.   Follow-Up Instructions: Return in about 3 months (around 06/13/2024) for Rheumatoid arthritis,  ILD.   Romayne Clubs, PA-C  Note - This record has been created using Dragon software.  Chart creation errors have been sought, but may not always  have been located. Such creation errors do not reflect on  the standard of medical care.

## 2024-03-13 ENCOUNTER — Encounter: Payer: Self-pay | Admitting: Physician Assistant

## 2024-03-13 ENCOUNTER — Ambulatory Visit (INDEPENDENT_AMBULATORY_CARE_PROVIDER_SITE_OTHER)

## 2024-03-13 ENCOUNTER — Ambulatory Visit: Payer: Medicare Other | Attending: Physician Assistant | Admitting: Physician Assistant

## 2024-03-13 VITALS — BP 132/82 | HR 85 | Resp 15 | Ht 60.0 in | Wt 171.0 lb

## 2024-03-13 DIAGNOSIS — M25512 Pain in left shoulder: Secondary | ICD-10-CM | POA: Diagnosis not present

## 2024-03-13 DIAGNOSIS — L97929 Non-pressure chronic ulcer of unspecified part of left lower leg with unspecified severity: Secondary | ICD-10-CM

## 2024-03-13 DIAGNOSIS — Z79899 Other long term (current) drug therapy: Secondary | ICD-10-CM

## 2024-03-13 DIAGNOSIS — M5030 Other cervical disc degeneration, unspecified cervical region: Secondary | ICD-10-CM | POA: Diagnosis not present

## 2024-03-13 DIAGNOSIS — E785 Hyperlipidemia, unspecified: Secondary | ICD-10-CM

## 2024-03-13 DIAGNOSIS — J849 Interstitial pulmonary disease, unspecified: Secondary | ICD-10-CM | POA: Diagnosis not present

## 2024-03-13 DIAGNOSIS — Z8774 Personal history of (corrected) congenital malformations of heart and circulatory system: Secondary | ICD-10-CM

## 2024-03-13 DIAGNOSIS — M0609 Rheumatoid arthritis without rheumatoid factor, multiple sites: Secondary | ICD-10-CM | POA: Diagnosis not present

## 2024-03-13 DIAGNOSIS — I10 Essential (primary) hypertension: Secondary | ICD-10-CM

## 2024-03-13 DIAGNOSIS — I059 Rheumatic mitral valve disease, unspecified: Secondary | ICD-10-CM

## 2024-03-13 DIAGNOSIS — Z981 Arthrodesis status: Secondary | ICD-10-CM

## 2024-03-13 DIAGNOSIS — M47816 Spondylosis without myelopathy or radiculopathy, lumbar region: Secondary | ICD-10-CM

## 2024-03-13 DIAGNOSIS — M797 Fibromyalgia: Secondary | ICD-10-CM | POA: Diagnosis not present

## 2024-03-13 DIAGNOSIS — Z8719 Personal history of other diseases of the digestive system: Secondary | ICD-10-CM

## 2024-03-13 DIAGNOSIS — G894 Chronic pain syndrome: Secondary | ICD-10-CM

## 2024-03-13 DIAGNOSIS — Z8639 Personal history of other endocrine, nutritional and metabolic disease: Secondary | ICD-10-CM

## 2024-03-13 DIAGNOSIS — M8589 Other specified disorders of bone density and structure, multiple sites: Secondary | ICD-10-CM

## 2024-03-13 DIAGNOSIS — I83029 Varicose veins of left lower extremity with ulcer of unspecified site: Secondary | ICD-10-CM

## 2024-03-13 DIAGNOSIS — I7300 Raynaud's syndrome without gangrene: Secondary | ICD-10-CM

## 2024-03-13 DIAGNOSIS — G8929 Other chronic pain: Secondary | ICD-10-CM

## 2024-03-13 DIAGNOSIS — F418 Other specified anxiety disorders: Secondary | ICD-10-CM

## 2024-03-13 DIAGNOSIS — M5134 Other intervertebral disc degeneration, thoracic region: Secondary | ICD-10-CM | POA: Diagnosis not present

## 2024-03-13 DIAGNOSIS — E119 Type 2 diabetes mellitus without complications: Secondary | ICD-10-CM

## 2024-03-13 DIAGNOSIS — Z79891 Long term (current) use of opiate analgesic: Secondary | ICD-10-CM

## 2024-03-13 DIAGNOSIS — E559 Vitamin D deficiency, unspecified: Secondary | ICD-10-CM

## 2024-03-13 DIAGNOSIS — M170 Bilateral primary osteoarthritis of knee: Secondary | ICD-10-CM | POA: Diagnosis not present

## 2024-03-13 DIAGNOSIS — E1169 Type 2 diabetes mellitus with other specified complication: Secondary | ICD-10-CM

## 2024-03-13 DIAGNOSIS — N1831 Chronic kidney disease, stage 3a: Secondary | ICD-10-CM

## 2024-03-13 DIAGNOSIS — Z111 Encounter for screening for respiratory tuberculosis: Secondary | ICD-10-CM

## 2024-03-13 DIAGNOSIS — Z8781 Personal history of (healed) traumatic fracture: Secondary | ICD-10-CM

## 2024-03-13 MED ORDER — LIDOCAINE HCL 1 % IJ SOLN
1.0000 mL | INTRAMUSCULAR | Status: AC | PRN
  Administered 2024-03-13: 1 mL

## 2024-03-13 MED ORDER — TRIAMCINOLONE ACETONIDE 40 MG/ML IJ SUSP
40.0000 mg | INTRAMUSCULAR | Status: AC | PRN
  Administered 2024-03-13: 40 mg via INTRA_ARTICULAR

## 2024-03-13 NOTE — Telephone Encounter (Signed)
 Per Jacinta Martinis, PA-C: patient is ready to resume Actemra . Please contact patient regarding Tyenne cost/options. Thanks!

## 2024-03-13 NOTE — Patient Instructions (Signed)
 Standing Labs We placed an order today for your standing lab work.   Please have your standing labs drawn in August and every 3 months   Please have your labs drawn 2 weeks prior to your appointment so that the provider can discuss your lab results at your appointment, if possible.  Please note that you may see your imaging and lab results in MyChart before we have reviewed them. We will contact you once all results are reviewed. Please allow our office up to 72 hours to thoroughly review all of the results before contacting the office for clarification of your results.  WALK-IN LAB HOURS  Monday through Thursday from 8:00 am -12:30 pm and 1:00 pm-4:00 pm and Friday from 8:00 am-12:00 pm.  Patients with office visits requiring labs will be seen before walk-in labs.  You may encounter longer than normal wait times. Please allow additional time. Wait times may be shorter on  Monday and Thursday afternoons.  We do not book appointments for walk-in labs. We appreciate your patience and understanding with our staff.   Labs are drawn by [facility]. Please bring your co-pay at the time of your lab draw.  You may receive a bill from [facility] for your lab work.  Please note if you are on Hydroxychloroquine  and and an order has been placed for a Hydroxychloroquine  level,  you will need to have it drawn 4 hours or more after your last dose.  If you wish to have your labs drawn at another location, please call the office 24 hours in advance so we can fax the orders.  The office is located at [address], [address], [place], [place] [address]   If you have any questions regarding directions or hours of operation,  please call [phone].   As a reminder, please drink plenty of water prior to coming for your lab work. Thanks!

## 2024-03-14 ENCOUNTER — Ambulatory Visit: Payer: Self-pay | Admitting: Physician Assistant

## 2024-03-14 ENCOUNTER — Other Ambulatory Visit: Payer: Self-pay | Admitting: *Deleted

## 2024-03-14 DIAGNOSIS — Z79899 Other long term (current) drug therapy: Secondary | ICD-10-CM

## 2024-03-14 DIAGNOSIS — R899 Unspecified abnormal finding in specimens from other organs, systems and tissues: Secondary | ICD-10-CM

## 2024-03-14 NOTE — Progress Notes (Signed)
 Platelet count remains elevated-likely due to active inflammatory process. Rest of CBC WNL. Potassium is elevated-5.6 rest of CMP stable. Please clarify if she has been taking any potassium supplements? Any other changes in medication or diet? Recommend repeating potassium within 2-3 days.

## 2024-03-15 LAB — CBC WITH DIFFERENTIAL/PLATELET
Absolute Lymphocytes: 1610 {cells}/uL (ref 850–3900)
Absolute Monocytes: 728 {cells}/uL (ref 200–950)
Basophils Absolute: 49 {cells}/uL (ref 0–200)
Basophils Relative: 0.5 %
Eosinophils Absolute: 68 {cells}/uL (ref 15–500)
Eosinophils Relative: 0.7 %
HCT: 39.7 % (ref 35.0–45.0)
Hemoglobin: 12.7 g/dL (ref 11.7–15.5)
MCH: 27.6 pg (ref 27.0–33.0)
MCHC: 32 g/dL (ref 32.0–36.0)
MCV: 86.3 fL (ref 80.0–100.0)
MPV: 9.4 fL (ref 7.5–12.5)
Monocytes Relative: 7.5 %
Neutro Abs: 7246 {cells}/uL (ref 1500–7800)
Neutrophils Relative %: 74.7 %
Platelets: 462 10*3/uL — ABNORMAL HIGH (ref 140–400)
RBC: 4.6 10*6/uL (ref 3.80–5.10)
RDW: 12.8 % (ref 11.0–15.0)
Total Lymphocyte: 16.6 %
WBC: 9.7 10*3/uL (ref 3.8–10.8)

## 2024-03-15 LAB — COMPREHENSIVE METABOLIC PANEL WITH GFR
AG Ratio: 1.2 (calc) (ref 1.0–2.5)
ALT: 11 U/L (ref 6–29)
AST: 12 U/L (ref 10–35)
Albumin: 4.1 g/dL (ref 3.6–5.1)
Alkaline phosphatase (APISO): 61 U/L (ref 37–153)
BUN/Creatinine Ratio: 35 (calc) — ABNORMAL HIGH (ref 6–22)
BUN: 26 mg/dL — ABNORMAL HIGH (ref 7–25)
CO2: 29 mmol/L (ref 20–32)
Calcium: 9.2 mg/dL (ref 8.6–10.4)
Chloride: 98 mmol/L (ref 98–110)
Creat: 0.75 mg/dL (ref 0.60–1.00)
Globulin: 3.3 g/dL (ref 1.9–3.7)
Glucose, Bld: 133 mg/dL — ABNORMAL HIGH (ref 65–99)
Potassium: 5.6 mmol/L — ABNORMAL HIGH (ref 3.5–5.3)
Sodium: 137 mmol/L (ref 135–146)
Total Bilirubin: 0.2 mg/dL (ref 0.2–1.2)
Total Protein: 7.4 g/dL (ref 6.1–8.1)
eGFR: 83 mL/min/{1.73_m2} (ref >=60)

## 2024-03-15 LAB — QUANTIFERON-TB GOLD PLUS
Mitogen-NIL: 9.03 [IU]/mL
NIL: 0.02 [IU]/mL
QuantiFERON-TB Gold Plus: NEGATIVE
TB1-NIL: 0 [IU]/mL
TB2-NIL: <0 [IU]/mL

## 2024-03-15 NOTE — Telephone Encounter (Signed)
 ATC to call patient to discuss Medicare PPP with Tina Berger. Unable to reach her. LVM.

## 2024-03-15 NOTE — Progress Notes (Signed)
 TB gold negative

## 2024-03-19 ENCOUNTER — Other Ambulatory Visit: Payer: Self-pay | Admitting: *Deleted

## 2024-03-19 DIAGNOSIS — Z79899 Other long term (current) drug therapy: Secondary | ICD-10-CM | POA: Diagnosis not present

## 2024-03-19 DIAGNOSIS — R899 Unspecified abnormal finding in specimens from other organs, systems and tissues: Secondary | ICD-10-CM

## 2024-03-20 ENCOUNTER — Ambulatory Visit: Payer: Self-pay | Admitting: Physician Assistant

## 2024-03-20 LAB — POTASSIUM: Potassium: 6.1 mmol/L — ABNORMAL HIGH (ref 3.5–5.3)

## 2024-03-20 NOTE — Progress Notes (Signed)
 Potassium remains elevated and has increased--recommend evaluation by PCP this week

## 2024-03-21 ENCOUNTER — Telehealth: Payer: Self-pay | Admitting: Family Medicine

## 2024-03-21 NOTE — Telephone Encounter (Signed)
 High potassium from rheumatology Lab Results  Component Value Date   NA 137 03/13/2024   K 6.1 (H) 03/19/2024   CO2 29 03/13/2024   GLUCOSE 133 (H) 03/13/2024   BUN 26 (H) 03/13/2024   CREATININE 0.75 03/13/2024   CALCIUM  9.2 03/13/2024   GFR 66.95 09/05/2023   EGFR 83 03/13/2024   GFRNONAA >60 12/26/2022    Please advised pt to hold her ramipril  (altace )  Stop any vitamins with K in them  Follow up here asap for visit and re check   If any chest pain or palpitations in meantime- go to ER   Thanks

## 2024-03-21 NOTE — Telephone Encounter (Signed)
 Spoke with patient's husband verified via DPR in patient's chart. Husband says patient is unable to come to the phone at the moment. Telephone note was given to him. He verbalized understanding and stated another medical call was coming through, so he had to get off the phone to take it. He says they would call back to get an appointment scheduled to follow up and discuss with provider. FYI

## 2024-03-22 ENCOUNTER — Other Ambulatory Visit (HOSPITAL_COMMUNITY): Payer: Self-pay

## 2024-03-22 NOTE — Telephone Encounter (Signed)
 Pt notified of Dr. Belva Boyden comments and f/u appt scheduled with PCP, pt will hold ramipril  until appt

## 2024-03-22 NOTE — Telephone Encounter (Signed)
 Called pt regarding Tyenne approval and copay. She states she is already paying copays for eye drops that were prescribed after her surgery and would likely pay towards $2000 OOP max for year with Tyenne. She is seeing ophthalmologist this afternoon and will return call to pharmacy team once she has reached decision and okay from this provider  Geraldene Kleine, PharmD, MPH, BCPS, CPP Clinical Pharmacist (Rheumatology and Pulmonology)

## 2024-03-22 NOTE — Telephone Encounter (Signed)
-----   Message from [place] sent at 03/20/2024  8:23 PM EDT ----- Pt"s K is high  Please schedule follow up in office as soon as possible and tell her to hold her generic ramipril  (altace ) Also hold any supplements that may have K in then  Thanks ----- Message ----- From: Ardella Konig, RT Sent: 03/20/2024   8:38 AM EDT To: Clemens Curt, MD  Please review mutual patient's lab results. Thank you.

## 2024-03-26 ENCOUNTER — Ambulatory Visit (INDEPENDENT_AMBULATORY_CARE_PROVIDER_SITE_OTHER)

## 2024-03-26 ENCOUNTER — Ambulatory Visit: Admitting: Podiatry

## 2024-03-26 ENCOUNTER — Encounter: Payer: Self-pay | Admitting: Podiatry

## 2024-03-26 DIAGNOSIS — M722 Plantar fascial fibromatosis: Secondary | ICD-10-CM | POA: Diagnosis not present

## 2024-03-26 DIAGNOSIS — I7300 Raynaud's syndrome without gangrene: Secondary | ICD-10-CM | POA: Diagnosis not present

## 2024-03-26 DIAGNOSIS — M2042 Other hammer toe(s) (acquired), left foot: Secondary | ICD-10-CM

## 2024-03-26 DIAGNOSIS — M2041 Other hammer toe(s) (acquired), right foot: Secondary | ICD-10-CM | POA: Diagnosis not present

## 2024-03-26 DIAGNOSIS — M2012 Hallux valgus (acquired), left foot: Secondary | ICD-10-CM

## 2024-03-26 DIAGNOSIS — M21612 Bunion of left foot: Secondary | ICD-10-CM

## 2024-03-26 DIAGNOSIS — M2011 Hallux valgus (acquired), right foot: Secondary | ICD-10-CM

## 2024-03-27 ENCOUNTER — Telehealth: Payer: Self-pay

## 2024-03-27 ENCOUNTER — Encounter: Payer: Self-pay | Admitting: Family Medicine

## 2024-03-27 ENCOUNTER — Ambulatory Visit (INDEPENDENT_AMBULATORY_CARE_PROVIDER_SITE_OTHER): Admitting: Family Medicine

## 2024-03-27 VITALS — BP 145/89 | HR 77 | Temp 98.8°F | Ht 60.0 in | Wt 171.5 lb

## 2024-03-27 DIAGNOSIS — E039 Hypothyroidism, unspecified: Secondary | ICD-10-CM

## 2024-03-27 DIAGNOSIS — E875 Hyperkalemia: Secondary | ICD-10-CM

## 2024-03-27 DIAGNOSIS — I10 Essential (primary) hypertension: Secondary | ICD-10-CM | POA: Diagnosis not present

## 2024-03-27 NOTE — Telephone Encounter (Signed)
 Contacted patient to let her know it was okay to continue her Actemra . Patient stated she was not doing Actemra  due to price but she would be doing the generic.

## 2024-03-27 NOTE — Progress Notes (Signed)
 Subjective:  Patient ID: Tina Berger, female    DOB: November 16, 1950,  MRN: [id number]  Chief Complaint  Patient presents with   Toe Pain    "On my left foot, I have a Hammer Toe and on my right foot, my second toe crosses over my big toe."    Discussed the use of AI scribe software for clinical note transcription with the patient, who gave verbal consent to proceed.  History of Present Illness Tina Berger "Tina Berger" is a 73 year old female with rheumatoid arthritis and diabetes who presents with foot pain and deformities.  She experiences significant foot pain, particularly in the toes, with crossover toes and hammer toes. The left foot is the most painful, and a knot is present on the bottom of her foot. These issues have persisted for some time, impacting her daily activities and prompting her to seek relief.  Her rheumatoid arthritis is managed with a daily dose of 10 mg prednisone , as she is intolerant to other medications. She previously used Actemra  but is seeking a new medication due to cost issues. Attempts to reduce prednisone  led to a flare-up, and she is awaiting a new treatment to potentially reduce prednisone  use.  She has Raynaud's phenomenon, affecting circulation, especially in cold environments. A previous vascular surgery was performed due to a rare condition involving an artery and vein, and she is concerned about a similar issue developing on her ankle.  Her diabetes is well-controlled with an A1c of 6. She has undergone cataract surgery, which significantly improved her vision. Rheumatoid arthritis primarily affects her hands and feet, impacting daily activities.  She has a history of plantar fasciitis, which flares with increased walking and lack of heel support. Falls have occurred due to inadequate footwear, leading her to avoid slippers. She is considering better foot support options to alleviate pain.  Currently, she uses comfortable shoes and is exploring  additional support options like metatarsal pads to manage foot pain. A history of going barefoot is acknowledged as a potential contributor to her foot issues.      Objective:    Physical Exam VASCULAR: DP and PT pulse palpable. Cyanosis and pallor with Raynaud's, improves with warming. Capillary fill time is adequate. DERMATOLOGIC: Normal skin turgor texture and temperature. No open lesions or rashes or ulcerations. NEUROLOGIC: Normal sensation to light touch and pressure. No paresthesias on examination. ORTHOPEDIC: Significant hallux valgus deformity with overlapping second toe and underlapping hallux on right, semi-rigid hammer toe of second toe on left. Prominent callus formation on plantar second MTP bilaterally. Deformities fairly reducible. Good range of motion of MTP joint.     No images are attached to the encounter.    Results LABS A1c: 6%  RADIOLOGY Foot X-ray: Hallux valgus deformity, hammer toe deformities, more severe on the right with crossover toe deformity (03/26/2024)   Assessment:   1. Hammer toe of left foot   2. Acquired hallux valgus of right foot   3. Hallux valgus with bunions, left   4. Hammertoe of right foot      Plan:  Patient was evaluated and treated and all questions answered.  Assessment and Plan Assessment & Plan Hallux valgus and hammer toe deformities Significant hallux valgus deformity with overlapping second toe and underlapping hallux on the right foot. Semi-rigid hammer toe of the second toe on the left foot. Prominent callus formation on the plantar second MTP bilaterally. Deformities are fairly reducible with good range of motion of the MTP joint. Radiographs  show more severe deformity on the right with crossover toe deformity. Surgical options discussed include minimally invasive small incisional approaches. Risks involve management of rheumatological conditions and potential healing issues due to Raynaud's phenomenon. Benefits include  significant symptom improvement. Surgery should be considered in warmer months to avoid Raynaud's complications. Surgery involves fixing the bunion by cutting and realigning bones, shortening the bone behind the hammer toe, and using screws to hold the toes in place. Healing time is approximately 8 weeks, with full recovery taking up to 3 months. - Consider surgical correction using minimally invasive small incisional techniques.  Surgically would need hammertoe correction, second metatarsal osteotomy and bunion correction - Consult rheumatologist about managing prednisone  use prior to surgery. - Use comfortable shoes with room for toes and pressure relief. - Consider metatarsal gel pad sleeve for symptom relief.  Metatarsalgia Pressure and pain under the ball of the foot, particularly under the second toe where there is loss of fat pad. Discussed the use of metatarsal pads for symptom relief. - Use metatarsal gel pad sleeve for cushioning and support.  Raynaud's phenomenon Raynaud's phenomenon with cyanosis and pallor, improving with digital warming. Good circulation with palpable DP and PT pulses. Discussed potential impact on surgical outcomes and precautions to minimize risks, such as avoiding tourniquet use and keeping the foot warm during surgery. Surgery should be avoided in cold weather months to minimize Raynaud's complications. - Avoid foot surgery in cold weather months to minimize Raynaud's complications. - Ensure good circulation management during any surgical procedures.  Rheumatoid arthritis Rheumatoid arthritis managed with prednisone  due to intolerance to other medications. Previous use of Actemra  discontinued due to cost. Exploring new medication options with rheumatologist. Chronic steroid use may impact bone healing post-surgery. Discussed the need to coordinate with rheumatologist regarding timing of surgery and medication management. - Discuss with rheumatologist about  alternative medications to reduce prednisone  use. - Coordinate with rheumatologist regarding timing of surgery and medication management.  Type 2 diabetes mellitus Type 2 diabetes mellitus with recent A1c of 6, indicating good control.  Plantar fasciitis Plantar fasciitis with occasional flare-ups, particularly with increased walking.  Follow-up Discussed timing and planning for potential foot surgery, considering Raynaud's and other health conditions. Surgery to be considered in warmer months to avoid complications from cold weather. - Schedule surgery in late spring or early summer to avoid cold weather complications. - Follow up with podiatrist when ready to schedule surgery.      No follow-ups on file.

## 2024-03-27 NOTE — Assessment & Plan Note (Addendum)
 Lab Results  Component Value Date   K 6.1 (H) 03/19/2024   From rheumatology  Was taking ace (ramipril ) and also some over the counter nsaids at time of draw  No symptoms Reassuring EKG  Off ace since 6/3 Off nsaid for 2 d   Lab today   If still high can consider brief lasix (but pt notes in past this makes her swell instead of eliminating swelling)   Call back and Er precautions noted in detail today

## 2024-03-27 NOTE — Progress Notes (Signed)
 Subjective:    Patient ID: Tina Berger, female    DOB: 08/26/1951, 73 y.o.   MRN: [id number]  HPI  Wt Readings from Last 3 Encounters:  03/27/24 171 lb 8 oz (77.8 kg)  03/13/24 171 lb (77.6 kg)  02/21/24 168 lb (76.2 kg)   33.49 kg/m  Vitals:   03/27/24 1507 03/27/24 1537  BP: (!) 136/92 (!) 145/89  Pulse: 77   Temp: 98.8 F (37.1 C)   SpO2: 96%     Pt presents for follow up of elevated K  Rheumatologist did labs  Noted K elevation 5.6, then repeated on 03/19/24 and it was 6.1    We instructed to hold her ramipril  and any vitamins with K   Has history of HTN  Also CAD /vascular dz and raynaud   Recently eating healthier - perhaps some high potassium foods like broccoli  Was taking some ibuprofen  up until 2 d ago   Stopped her adkins shake also (saw K in the ingredient list)   Taking prednisone  10 mg daily for RA    No cp or palpitations or headaches or edema  No side effects to medicines  BP Readings from Last 3 Encounters:  03/27/24 (!) 145/89  03/13/24 132/82  02/21/24 123/79   Holding ramipril  5 mg daily (has been off of it now since 6/3)  Takes metoprolol  xl 50 mg bid   Pulse Readings from Last 3 Encounters:  03/27/24 77  03/13/24 85  02/21/24 72     History of pedal edema in past    Hypothyroidism  Pt has no clinical changes Stays sweaty-this is not new  No change in energy level/ hair or skin/ edema and no tremor Lab Results  Component Value Date   TSH 2.51 09/05/2023    Takes levothyroxine  125 mcg daily   Lab Results  Component Value Date   NA 137 03/13/2024   K 6.1 (H) 03/19/2024   CO2 29 03/13/2024   GLUCOSE 133 (H) 03/13/2024   BUN 26 (H) 03/13/2024   CREATININE 0.75 03/13/2024   CALCIUM  9.2 03/13/2024   GFR 66.95 09/05/2023   EGFR 83 03/13/2024   GFRNONAA >60 12/26/2022   EKG today  NSR  Rate of 70    Patient Active Problem List   Diagnosis Date Noted   Hyperkalemia 03/27/2024   Aortic atherosclerosis (HCC)  09/12/2023   Skin lesion 09/12/2023   Frequent falls 12/29/2022   Dysuria 09/07/2021   Rheumatoid arthritis (HCC) 05/27/2021   Chronic venous insufficiency 05/23/2021   AVM (arteriovenous malformation) 04/04/2021   CAD (coronary artery disease) 01/22/2021   Primary osteoarthritis of both knees 01/12/2021   Positive ANA (antinuclear antibody) 11/17/2020   Joint pain 11/13/2020   Current use of proton pump inhibitor 08/26/2020   Estrogen deficiency 08/21/2018   TMJ (dislocation of temporomandibular joint), initial encounter 05/05/2018   Anterior neck pain 05/05/2018   Chronic pain syndrome 11/09/2017   Chronic upper extremity pain [facility]) (Bilateral) (L>R) 11/09/2017   Fibromyalgia syndrome 11/09/2017   Osteoarthritis 11/09/2017   Osteoarthritis of lumbar facet joint (Bilateral) 11/09/2017   Lumbar facet arthropathy (Bilateral) 11/09/2017   Lumbar facet syndrome (Bilateral) (L>R) 11/09/2017   Lumbar foraminal stenosis (multilevel) (Bilateral) 11/09/2017   DDD (degenerative disc disease), lumbar 11/09/2017   Thoracic levoscoliosis 11/09/2017   Thoracic facet syndrome (Bilateral) (L>R) 11/09/2017   Thoracic facet arthropathy (Bilateral) (R>L) 11/09/2017   DDD (degenerative disc disease), thoracic 11/09/2017   Thoracolumbar IVDD 11/09/2017  DDD (degenerative disc disease), cervical 11/09/2017   Osteoarthritis of cervical facet (Bilateral) 11/09/2017   Grade 1 Anterolisthesis of C7 over T1 11/09/2017   Cervical foraminal stenosis (Bilateral) 11/09/2017   History of fusion of cervical spine (C5-6 ACDF) 11/09/2017   Cervical facet syndrome (Bilateral) 11/09/2017   Disorder of skeletal system 11/09/2017   Cervical radiculitis (Bilateral) 11/09/2017   Lumbar Epidural lipomatosis 11/09/2017   Chronic sacroiliac joint pain (Bilateral) (L>R) 11/09/2017   Chronic hip pain (Bilateral) (L>R) 11/09/2017   Chronic upper back pain (Primary Area of Pain) (midline) 10/24/2017    Chronic neck pain (Secondary Area of Pain) (Bilateral)  (L>R) 10/24/2017   Chronic low back pain (Fourth Area of Pain) (Bilateral) (L>R) 10/24/2017   Chronic lower extremity pain (Fifth Area of Pain) (Bilateral) (L>R) 10/24/2017   Lumbar Grade 1 Retrolisthesis of L1-2 and L2-3 10/24/2017   Other long term (current) drug therapy 10/24/2017   Other specified health status 10/24/2017   Long term current use of opiate analgesic 10/24/2017   Long term prescription opiate use 10/24/2017   Opiate use 10/24/2017   DM type 2 (diabetes mellitus, type 2) (HCC) 06/14/2014   Pharmacologic therapy 06/14/2014   Pedal edema    Post-menopausal 06/23/2012   Lumbar disc disease with radiculopathy 11/18/2011   HTN (hypertension) 07/30/2011   Raynaud disease 07/30/2011   Obesity 07/30/2011   Routine general medical examination at a health care facility 03/28/2011   Palpitations 04/27/2010   Depression with anxiety 06/14/2008   Vitamin D  deficiency 05/13/2008   Osteopenia 03/25/2008   Hypothyroidism 03/28/2007   Hyperlipidemia associated with type 2 diabetes mellitus (HCC) 03/28/2007   Other iron deficiency anemias 03/28/2007   PANIC ATTACK 03/28/2007   KERATOCONJUNCTIVITIS SICCA 03/28/2007   Mitral valve disorder 03/28/2007   GERD 03/28/2007   IBS 03/28/2007   Rosacea 03/28/2007   PLANTAR FASCIITIS 03/28/2007   MIGRAINES, HX OF 03/28/2007   Past Medical History:  Diagnosis Date   Acute renal insufficiency 05/31/2014   pt not aware of this diagnosis   Anemia, iron deficiency    Anxiety    Bilateral lower extremity edema    a. uses torsemide    Broken wrist    did not require surgery   Cervical dysplasia    abnormal paps   Colon cancer screening 06/14/2014   Coronary artery disease    Cough 08/08/2012   Degenerative disk disease 11/19/2011   Depression    Diabetes mellitus type II    Diverticulosis    DJD (degenerative joint disease)    Drug rash 05/22/2011   Dry eyes    Dysrhythmia     metoprolol .   Edema    Elevated liver enzymes 03/21/2012   Encounter for routine gynecological examination 06/14/2014   ESOPHAGITIS 03/28/2007   Qualifier: Hospitalized for  By: Lennice Quivers CMA, Kelly Patient     Fibromyalgia    GASTRITIS 03/28/2007   Qualifier: History of  By: Lennice Quivers CMA, Kelly Patient     GERD (gastroesophageal reflux disease)    Hemorrhoids    external   HLD (hyperlipidemia)    HNP (herniated nucleus pulposus) 11/1997   T6,7,8 with DJD   HTN (hypertension)    Hyperglycemia 05/13/2008   Qualifier: Diagnosis of  By: Malissa Se MD, Dannette Dutch    Hypothyroidism    Interstitial lung disease (HCC)    pt not aware of this   Left ovarian cyst    x 3, rupture   Osteoarthritis    hands   Osteopenia  mild-11/01; improved 12/05   Other screening mammogram 08/18/2011   Palpitations    PERSONAL HISTORY ALLERGY UNSPEC MEDICINAL AGENT 03/18/2010   Qualifier: Diagnosis of  By: Malissa Se MD, Dannette Dutch    PONV (postoperative nausea and vomiting)    Raynaud's disease    Recurrent HSV (herpes simplex virus)    lesions in nose or mouth with frequent ulcers. d/t meds and dry mouth   Rheumatoid arthritis (HCC)    Rhinitis 03/21/2012   Routine general medical examination at a health care facility 03/28/2011   Past Surgical History:  Procedure Laterality Date   ABDOMINAL EXPLORATION SURGERY     ACDF  1991   BIOPSY OF SKIN SUBCUTANEOUS TISSUE AND/OR MUCOUS MEMBRANE Left 04/08/2021   Procedure: BIOPSY OF SKIN SUBCUTANEOUS TISSUE AND/OR MUCOUS MEMBRANE ( REMOVAL SKIN NODULE);  Surgeon: Jackquelyn Mass, MD;  Location: [facility];  Service: Vascular;  Laterality: Left;   CATARACT EXTRACTION W/PHACO Right 02/07/2024   Procedure: PHACOEMULSIFICATION, CATARACT, WITH IOL INSERTION 8.20 00:48.7;  Surgeon: Clair Crews, MD;  Location: [facility];  Service: Ophthalmology;  Laterality: Right;   CATARACT EXTRACTION W/PHACO Left 02/21/2024   Procedure: PHACOEMULSIFICATION, CATARACT, WITH  IOL INSERTION 5.42 00:40.0;  Surgeon: Clair Crews, MD;  Location: [facility];  Service: Ophthalmology;  Laterality: Left;   COLONOSCOPY  08/2000   Diverticulosis; hemorrhoids   HAND SURGERY Left    left thumb. PIN REMOVED   LASIK     bilateral   Social History   Tobacco Use   Smoking status: Former    Current packs/day: 0.00    Average packs/day: 1 pack/day for 5.0 years (5.0 ttl pk-yrs)    Types: Cigarettes    Start date: 10/19/1963    Quit date: 10/18/1968    Years since quitting: 55.4    Passive exposure: Past   Smokeless tobacco: Never   Tobacco comments:    quit over 40 years  Vaping Use   Vaping status: Never Used  Substance Use Topics   Alcohol use: No    Alcohol/week: 0.0 standard drinks of alcohol   Drug use: No   Family History  Problem Relation Age of Onset   Lung cancer Mother        + smoker   Coronary artery disease Mother        relatively young   Emphysema Father        + smoker   Lymphoma Sister    Heart disease Brother    Lymphoma Brother    Parkinson's disease Brother    Anemia Brother        aplastic    Diabetes Brother    Iron deficiency Daughter    Leukemia Son    Breast cancer Neg Hx    Allergies  Allergen Reactions   Ceftin  [Cefuroxime ] Swelling    Swelling, "legs turn blue".  Legs swelling, then blue, then a rash   Clonidine  Derivatives     Swelling of lips   Erythromycin Swelling    Rash, swollen gums    Keflex  [Cephalexin ] Anaphylaxis    Chest was broken out in a rash. Lips were swelling. Took a few days to occur, but it kept getting worse.  Penicillins Anaphylaxis    Pt had a daughter with an allergy to this at a young age. Patient developed blisters anywhere her child touched her. Also, if she used the bathroom after daughter  did while on keflex , it would cause a reaction for her. Lips and roof of mouth swelled up also.   Sulfonamide Derivatives Swelling    Rash, swollen gums, lips   Ciprofloxacin     REACTION: ? rash vs sun rxn. Rash    Fluoxetine Hcl     REACTION: stomach problems. Severe pain in abdomen. Could not function d/t pain   Furosemide Swelling    REACTION: swelling worsened in legs once taking   Gabapentin Other (See Comments)    REACTION: edema of feet. Unable to get shoes on   Hydroxychloroquine  Itching    Pt report itching felt like "pre-anaphylaxis" she has had with other medications   Paroxetine     REACTION: weight gain (30 pound weight gain   Pregabalin     REACTION: swelling of feet and legs. Unable to get shoes on.   Tetracycline     REACTION:inflammed genitals.    Venlafaxine     REACTION: sweating profusely   Etodolac     REACTION: reaction not known   Macrobid  [Nitrofurantoin ]     REACTION: ? Lung problem    Amitriptyline Hcl     REACTION: sedating   Atorvastatin     REACTION: muscle twitch and pain   Benicar  [Olmesartan  Medoxomil]     Muscle pain    Cephalexin  Hives and Rash   Cetirizine Hcl     REACTION: headache   Diovan [Valsartan]     Thought it made her feel confused   Naproxen Sodium Other (See Comments)    REACTION: edema of feet and legs.  Not good for ckd   Current Outpatient Medications on File Prior to Visit  Medication Sig Dispense Refill   Acetaminophen  (TYLENOL  ARTHRITIS PAIN PO) Take 650 mg by mouth daily.     buPROPion  (WELLBUTRIN  XL) 150 MG 24 hr tablet TAKE 1 TABLET BY MOUTH DAILY 90 tablet 2   Chlorphen-Phenyleph-ASA (ALKA-SELTZER PLUS COLD PO) Take by mouth as needed. Just for colds     cholecalciferol (VITAMIN D3) 25 MCG (1000 UNIT) tablet Take 1,000 Units by mouth daily.     cyclobenzaprine  (FLEXERIL ) 5 MG tablet Take 5 mg by mouth daily as needed.     Diclofenac  Sodium 1.5 % SOLN Place 2 mLs onto the skin 4 (four) times daily. 3 Bottle 3    DULoxetine  (CYMBALTA ) 60 MG capsule TAKE 1 CAPSULE BY MOUTH EVERY DAY 90 capsule 3   esomeprazole  (NEXIUM ) 20 MG capsule TAKE 1 CAPSULE BY MOUTH DAILY 90 capsule 2   famotidine  (PEPCID ) 40 MG tablet TAKE 1 TABLET BY MOUTH DAILY 90 tablet 2   ferrous sulfate 325 (65 FE) MG EC tablet Take 325 mg by mouth daily with breakfast.     glucose blood test strip One Touch Ultra stripts blue-To check sugar once daily and as needed for DM2 250.00 100 each 3   GUAIFENESIN CR PO Take by mouth as needed.     HYDROmorphone HCl (EXALGO) 12 MG T24A SR tablet Take 12 mg by mouth 2 (two) times daily.     hyoscyamine  (LEVSIN SL) 0.125 MG SL tablet Take 1 tablet (0.125 mg total) by mouth 2 (two) times daily as needed.     ketoconazole (NIZORAL) 2 % shampoo SHAMPOO WITH A SMALL AMOUNT AS DIRECTED  THREE TIMES A WEEK SHAMPOO SCALP 3 DAYS A WEEK, LET SHAMPOO SIT FOR 10 MINUTES AND RINSE OFF 120 mL 2   levothyroxine  (SYNTHROID ) 125 MCG tablet TAKE 1 TABLET BY MOUTH DAILY  BEFORE BREAKFAST 90 tablet 2   LYSINE PO Take by mouth as needed. OTC for ulcer prophylaxis     Magnesium  400 MG CAPS Take by mouth daily.     metFORMIN  (GLUCOPHAGE -XR) 500 MG 24 hr tablet TAKE 2 TABLETS BY MOUTH TWICE  DAILY WITH A MEAL 360 tablet 2   metoprolol  succinate (TOPROL -XL) 50 MG 24 hr tablet TAKE 1 TABLET BY MOUTH TWICE  DAILY 180 tablet 2   MIEBO 1.338 GM/ML SOLN      naloxone (NARCAN) nasal spray 4 mg/0.1 mL      ondansetron  (ZOFRAN ) 4 MG tablet Take 1 tablet (4 mg total) by mouth every 8 (eight) hours as needed for nausea or vomiting. 20 tablet 0   oxyCODONE  (OXYCONTIN ) 10 MG 12 hr tablet Take 10 mg by mouth 3 (three) times daily as needed. Beakthrough pain     prednisoLONE acetate (PRED FORTE) 1 % ophthalmic suspension INSTILL 1 DROP INTO RIGHT EYE FOUR TIMES A DAY TAPER AS INSTRUCTED     predniSONE  (DELTASONE ) 10 MG tablet Take 1 tablet (10 mg total) by mouth daily with breakfast. 30 tablet 1   ramipril  (ALTACE ) 5 MG capsule TAKE 1  CAPSULE BY MOUTH DAILY 90 capsule 2   rosuvastatin  (CRESTOR ) 5 MG tablet TAKE 1 TABLET BY MOUTH TWICE  WEEKLY 29 tablet 2   tiZANidine (ZANAFLEX) 4 MG tablet Take 4 mg by mouth every 8 (eight) hours as needed for muscle spasms.     Tocilizumab  (ACTEMRA  ACTPEN) 162 MG/0.9ML SOAJ Inject 162 mg into the skin every 14 (fourteen) days. 5.4 mL 0   vitamin B-12 (CYANOCOBALAMIN ) 500 MCG tablet Take 500 mcg by mouth daily.     XTAMPZA  ER 36 MG C12A Take 1 capsule by mouth every 12 (twelve) hours.     Xylitol (XYLIMELTS MT) Use as directed in the mouth or throat.     Current Facility-Administered Medications on File Prior to Visit  Medication Dose Route Frequency Provider Last Rate Last Admin   albuterol  (PROVENTIL ) (2.5 MG/3ML) 0.083% nebulizer solution 2.5 mg  2.5 mg Nebulization Once Ramaswamy, Murali, MD        Review of Systems  Constitutional:  Positive for fatigue. Negative for activity change, appetite change, fever and unexpected weight change.  HENT:  Negative for congestion, ear pain, rhinorrhea, sinus pressure and sore throat.   Eyes:  Negative for pain, redness and visual disturbance.  Respiratory:  Negative for cough, shortness of breath and wheezing.   Cardiovascular:  Negative for chest pain and palpitations.  Gastrointestinal:  Negative for abdominal pain, blood in stool, constipation and diarrhea.  Endocrine: Positive for heat intolerance. Negative for polydipsia and polyuria.  Genitourinary:  Negative for dysuria, frequency and urgency.  Musculoskeletal:  Positive for arthralgias, back pain and myalgias.  Skin:  Negative for pallor and rash.  Allergic/Immunologic: Negative for environmental allergies.  Neurological:  Negative for dizziness, syncope and headaches.  Hematological:  Negative for adenopathy. Does not bruise/bleed easily.  Psychiatric/Behavioral:  Negative for decreased concentration and dysphoric mood. The patient is not nervous/anxious.        Objective:    Physical Exam        Assessment & Plan:   Problem List Items Addressed This Visit       Cardiovascular  and Mediastinum   HTN (hypertension)   bp is up some after holding ace for hyperkalemia  BP Readings from Last 1 Encounters:  03/27/24 (!) 145/89  Continues metoprolol  xl 50 mg bid  Pulse of 77  Labs pending -then will make plan re: next steps (may elect to increase beta blocker)   Most recent labs reviewed  Disc lifstyle change with low sodium diet and exercise        Relevant Orders   Basic metabolic panel with GFR     Endocrine   Hypothyroidism   More fatigued  More sweaty lately  TSH ordered  Takes levothyroxine  125 mcg daily       Relevant Orders   TSH     Other   Hyperkalemia - Primary   Lab Results  Component Value Date   K 6.1 (H) 03/19/2024   From rheumatology  Was taking ace (ramipril ) and also some over the counter nsaids at time of draw  No symptoms Reassuring EKG  Off ace since 6/3 Off nsaid for 2 d   Lab today   If still high can consider brief lasix (but pt notes in past this makes her swell instead of eliminating swelling)   Call back and Er precautions noted in detail today        Relevant Orders   Basic metabolic panel with GFR

## 2024-03-27 NOTE — Assessment & Plan Note (Signed)
 More fatigued  More sweaty lately  TSH ordered  Takes levothyroxine  125 mcg daily

## 2024-03-27 NOTE — Assessment & Plan Note (Signed)
 bp is up some after holding ace for hyperkalemia  BP Readings from Last 1 Encounters:  03/27/24 (!) 145/89  Continues metoprolol  xl 50 mg bid  Pulse of 77  Labs pending -then will make plan re: next steps (may elect to increase beta blocker)   Most recent labs reviewed  Disc lifstyle change with low sodium diet and exercise

## 2024-03-27 NOTE — Patient Instructions (Addendum)
 Cutting sodium and caffeine can help lower blood pressure Weight loss and exercise help also    Labs today for potassium  We will make a plan when this returns (for blood pressure)   Also EKG today     Stay off the ramipril   Stay off all nsaids  (ibuprofen , naproxen)

## 2024-03-28 ENCOUNTER — Ambulatory Visit: Payer: Self-pay | Admitting: Family Medicine

## 2024-03-28 ENCOUNTER — Encounter: Payer: Self-pay | Admitting: Family Medicine

## 2024-03-28 LAB — BASIC METABOLIC PANEL WITH GFR
BUN: 21 mg/dL (ref 7–25)
CO2: 30 mmol/L (ref 20–32)
Calcium: 9.1 mg/dL (ref 8.6–10.4)
Chloride: 96 mmol/L — ABNORMAL LOW (ref 98–110)
Creat: 0.71 mg/dL (ref 0.60–1.00)
Glucose, Bld: 88 mg/dL (ref 65–99)
Potassium: 5.1 mmol/L (ref 3.5–5.3)
Sodium: 138 mmol/L (ref 135–146)
eGFR: 89 mL/min/{1.73_m2} (ref >=60)

## 2024-03-28 LAB — TSH: TSH: 0.92 m[IU]/L (ref 0.40–4.50)

## 2024-03-28 MED ORDER — AMLODIPINE BESYLATE 5 MG PO TABS
5.0000 mg | ORAL_TABLET | Freq: Every day | ORAL | 0 refills | Status: DC
Start: 2024-03-28 — End: 2024-06-25

## 2024-04-05 ENCOUNTER — Other Ambulatory Visit: Payer: Self-pay | Admitting: Physician Assistant

## 2024-04-05 ENCOUNTER — Telehealth: Payer: Self-pay

## 2024-04-05 NOTE — Telephone Encounter (Signed)
 Contacted patient to see if she was tapering down on the prednisone  as discussed in last office visit. Patient stated she is not tapering at this time. Declined refill on 1MG  prednisone  at this time

## 2024-04-06 NOTE — Telephone Encounter (Addendum)
 Called patient regarding Tyenne. She states that her husband has been unable to call insurance regarding cost of medication. Reviewed again the $2000 OOP max for the year. She states they will work on this early next week and reach back out to clinic next week once ready to switch to the [place]  Again she confirmed that she is still taking prednisone  10mg  daily and has been unable to taper since being off of Actemra    She has been cleared by ophthalmology to resume Actemra   Geraldene Kleine, PharmD, MPH, BCPS, CPP Clinical Pharmacist (Rheumatology and Pulmonology)

## 2024-04-08 ENCOUNTER — Other Ambulatory Visit: Payer: Self-pay | Admitting: Physician Assistant

## 2024-04-08 DIAGNOSIS — Z79899 Other long term (current) drug therapy: Secondary | ICD-10-CM

## 2024-04-08 DIAGNOSIS — M0609 Rheumatoid arthritis without rheumatoid factor, multiple sites: Secondary | ICD-10-CM

## 2024-04-09 ENCOUNTER — Ambulatory Visit: Admitting: Internal Medicine

## 2024-04-09 ENCOUNTER — Other Ambulatory Visit: Payer: Self-pay

## 2024-04-09 ENCOUNTER — Encounter: Payer: Self-pay | Admitting: Internal Medicine

## 2024-04-09 ENCOUNTER — Encounter (HOSPITAL_BASED_OUTPATIENT_CLINIC_OR_DEPARTMENT_OTHER)

## 2024-04-09 ENCOUNTER — Other Ambulatory Visit: Payer: Self-pay | Admitting: *Deleted

## 2024-04-09 VITALS — HR 77 | Ht 60.0 in | Wt 176.0 lb

## 2024-04-09 DIAGNOSIS — J8489 Other specified interstitial pulmonary diseases: Secondary | ICD-10-CM

## 2024-04-09 DIAGNOSIS — M359 Systemic involvement of connective tissue, unspecified: Secondary | ICD-10-CM

## 2024-04-09 DIAGNOSIS — R0602 Shortness of breath: Secondary | ICD-10-CM

## 2024-04-09 LAB — PULMONARY FUNCTION TEST
DL/VA % pred: 84 %
DL/VA: 3.61 ml/min/mmHg/L
DLCO unc % pred: 82 %
DLCO unc: 13.56 ml/min/mmHg
FEF 25-75 Pre: 1.7 L/s
FEF2575-%Pred-Pre: 114 %
FEV1-%Pred-Pre: 101 %
FEV1-Pre: 1.81 L
FEV1FVC-%Pred-Pre: 105 %
FEV6-%Pred-Pre: 100 %
FEV6-Pre: 2.28 L
FEV6FVC-%Pred-Pre: 104 %
FVC-%Pred-Pre: 96 %
FVC-Pre: 2.29 L
Pre FEV1/FVC ratio: 79 %
Pre FEV6/FVC Ratio: 99 %

## 2024-04-09 NOTE — Progress Notes (Signed)
 OV 06/02/2021  Subjective:  Patient ID: Tina Berger, female , DOB: 04-09-1951 , age 73 y.o. , MRN: [id number] , ADDRESS: [address] [place] [address] PCP Tower, Laine LABOR, MD Patient Care Team: Tower, Laine LABOR, MD as PCP - General Perla, Evalene PARAS, MD as Consulting Physician (Cardiology) Myra Browning, [facility] as Pharmacist (Pharmacist)  This Provider for this visit: Treatment Team:  Attending Provider: Geronimo Amel, MD    06/02/2021 -   Chief Complaint  Patient presents with   Consult    Pt is being referred by Dr. Dolphus for ILD work up. Pt had PFT performed today.  Pt states that she has had problems with SOB for years but it has been just getting worse.     HPI Tina Berger 73 y.o. -referred by Dr. Dolphus for concern of rheumatoid arthritis ILD.  History is provided by the patient and also review of the records.  She tells me that in 2013 she used to work at [employer] and was exposed to chlorine at work and a few days later there was a birthday party and in the swimming pool there is a lot of chlorine.  And a few days after that started having significant shortness of breath with exertion relieved by rest.  Was hypoxemic was admitted for 5 days.  During this time she was also nitrofurantoin  and was informed she had ILD because of chlorine exposure and nitrofurantoin .  After that she recovered and has been stable without any respiratory symptoms.  Then starting October 2021 started noticing increased fatigue and also in decreased mobility and hand and joint stiffness.  She did see Dr. JONETTA in March and April 2022.  Was given a diagnosis of rheumatoid arthritis negative rheumatoid factor positive CCP.  Mixed with methotrexate was discussed but later patient declined it.  Then in April 2022 started Imuran .  She tells me she took Imuran  for 8 weeks but then had vomiting fatigue fogginess and also scared about getting leukemia because she lost her son from  leukemia.  Therefore she stopped it.  She feels prednisone  will help her a lot but she says that she has been advised about the side effects of prednisone .  She has multiple drug allergies.  She says because of her ILD she was advised about 2 options for her rheumatoid arthritis.  I am presuming based on chart review this is methotrexate and Imuran .  She says the sores are too restrictive.  She specifically wants to know how I would treat her ILD.  [facility] Comprehensive ILD Questionnaire  Symptoms:    Past Medical History : Positive for rheumatoid arthritis diagnosed in 2022.  She has history of irregular heart rate not otherwise specified.  She sees Dr. Gollan.  She says she has had echocardiogram and stress test 2010 years ago.  She is on Lopressor .  She has coronary artery calcification on the CT but she says she sees a cardiologist regularly.  She also has GERD and hiatal hernia.  She has diabetes she has thyroid  disease not otherwise specified.  She has obesity   has a past medical history of Acute renal insufficiency (05/31/2014), Anemia, iron deficiency, Anxiety, Bilateral lower extremity edema, Cervical dysplasia, Colon cancer screening (06/14/2014), Coronary artery disease, Cough (08/08/2012), Degenerative disk disease (11/19/2011), Depression, Diabetes mellitus type II, Diverticulosis, DJD (degenerative joint disease), Drug rash (05/22/2011), Dry eyes, Dysrhythmia, Edema, Elevated liver enzymes (03/21/2012), Encounter for routine gynecological examination (06/14/2014), ESOPHAGITIS (03/28/2007), Fibromyalgia,  GASTRITIS (03/28/2007), GERD (gastroesophageal reflux disease), Hemorrhoids, HLD (hyperlipidemia), HNP (herniated nucleus pulposus) (11/1997), HTN (hypertension), Hyperglycemia (05/13/2008), Hypothyroidism, Interstitial lung disease (HCC), Left ovarian cyst, Osteoarthritis, Osteopenia, Other screening mammogram (08/18/2011), Palpitations, PERSONAL HISTORY ALLERGY UNSPEC  MEDICINAL AGENT (03/18/2010), PONV (postoperative nausea and vomiting), Raynaud's disease, Recurrent HSV (herpes simplex virus), Rheumatoid arthritis (HCC), Rhinitis (03/21/2012), and Routine general medical examination at a health care facility (03/28/2011).   has a past surgical history that includes Hand surgery (Left); Colonoscopy (08/2000); LASIK; Abdominal exploration surgery; ACDF (1991); and Biopsy of skin subcutaneous tissue and/or mucous membrane (Left, 04/08/2021).    ROS: She has significant fatigue she has arthralgia.  She has Raynaud's she has dry eyes dry mouth.  She has acid reflux.     FAMILY HISTORY of LUNG DISEASE: Positive for asthma in the family not otherwise specified but negative for COPD or pulmonary fibrosis or collagen vascular disease or vasculitis   EXPOSURE HISTORY: Smoked between 24 and 1970 to 10 cigarettes/day.  Does not smoke marijuana but no pipe no cocaine no marijuana use.  No intravenous drug use    HOME and HOBBY DETAILS : [place]  She has lived in the home for 42 years the age of the home is 66.  Built in 1969.  No dampness.  No mold or mildew in the house no humidifier use no CPAP use no nebulizer use no steam iron use no Jacuzzi use.  No misting [place] no parakeets inside the house no pet gerbils or hamsters.  No feather pillows or do way.  No mold in the [facility] duct.  No music habits no gardening habits no bird feathers.  There was mold and mildew in the bathroom at work but that was in the remote past has not worked in many years.  The building was 73 years old   OCCUPATIONAL HISTORY (122 questions) : No organic or inorganic antigen exposure history.  Greater than 122 item   PULMONARY TOXICITY HISTORY (27 items): She did take Macrobid /nitrofurantoin  in 2013.  She took Imuran  for 8 weeks in 2022       Narrative & Impression  CLINICAL DATA:  73 year old female with history of shortness of breath. History of  rheumatoid arthritis. Shortness of breath with activity.   EXAM: CT CHEST WITHOUT CONTRAST   TECHNIQUE: Multidetector CT imaging of the chest was performed following the standard protocol without intravenous contrast. High resolution imaging of the lungs, as well as inspiratory and expiratory imaging, was performed.   COMPARISON:  Chest CT 04/03/2012.   FINDINGS: Cardiovascular: Heart size is normal. There is no significant pericardial fluid, thickening or pericardial calcification. There is aortic atherosclerosis, as well as atherosclerosis of the great vessels of the mediastinum and the coronary arteries, including calcified atherosclerotic plaque in the left main, left anterior descending and right coronary arteries.   Mediastinum/Nodes: No pathologically enlarged mediastinal or hilar lymph nodes. Please note that accurate exclusion of hilar adenopathy is limited on noncontrast CT scans. Esophagus is unremarkable in appearance. No axillary lymphadenopathy.   Lungs/Pleura: High-resolution images demonstrate patchy areas of ground-glass attenuation, septal thickening, mild subpleural reticulation and peripheral bronchiolectasis scattered throughout the lungs bilaterally. Findings have a definitive craniocaudal gradient and appear mildly progressive compared to the prior study, with slightly less ground-glass attenuation but more septal thickening and subpleural reticulation. No frank honeycombing. Inspiratory and expiratory imaging demonstrates some mild air trapping indicative of small airways disease. In addition, there is collapse of the trachea and mainstem  bronchi during expiration. No acute consolidative airspace disease. No pleural effusions. No suspicious appearing pulmonary nodules or masses are noted.   Upper Abdomen: Unremarkable.   Musculoskeletal: There are no aggressive appearing lytic or blastic lesions noted in the visualized portions of the skeleton.    IMPRESSION: 1. The appearance of the lungs is compatible with interstitial lung disease which is mildly progressive when compared to remote prior study from 9 years ago. The spectrum of findings on today's examination is categorized as probable usual interstitial pneumonia (UIP) per current ATS guidelines. 2. Aortic atherosclerosis, in addition to left main and 2 vessel coronary artery disease. Assessment for potential risk factor modification, dietary therapy or pharmacologic therapy may be warranted, if clinically indicated.   Aortic Atherosclerosis (ICD10-I70.0).     Electronically Signed   By: Toribio Aye M.D.   On: 01/19/2021 18:20       OV 03/26/2022  Subjective:  Patient ID: Tina Berger, female , DOB: 08/21/1951 , age 49 y.o. , MRN: [id number] , ADDRESS: [address] [place] [address] PCP Tower, Laine LABOR, MD Patient Care Team: Tower, Laine LABOR, MD as PCP - General Perla, Evalene PARAS, MD as Consulting Physician (Cardiology) Myra Browning, [facility] as Pharmacist (Pharmacist)  This Provider for this visit: Treatment Team:  Attending Provider: Geronimo Amel, MD    03/26/2022 -   Chief Complaint  Patient presents with   Follow-up    PFT performed today.  Pt states she has been doing okay since last visit and denies any complaints.     HPI Tina Berger 73 y.o. -presents for follow-up.  She is on observation from a pulmonary perspective for interstitial lung disease.  This is because she has mild disease burden and we do not know if it is progressive phenotype as yet.  Since her last visit from a respiratory standpoint she says she is stable.  In fact her lung function is showing stability with FVC slightly better and the DLCO being the same.  They actually look normal.  Her main issue is that she is continuing to have significant issues with rheumatoid arthritis.  She did not tolerate leflunomide  it caused some knuckle swelling confusing it for rheumatoid  arthritis itself but when she stopped leflunomide  approximately a month ago this got better.  She is on Actemra  which she is tolerating well.  She has been on Actemra  since October 2022 she is having significant joint pain.  Early in the morning her pain can be 5 out of 5.  She follows with Dr. JONETTA for this.  Her pulmonary function test and walking desaturation test are stable.    OV 03/01/2023  Subjective:  Patient ID: Tina Berger, female , DOB: November 22, 1950 , age 27 y.o. , MRN: [id number] , ADDRESS: [address] [place] [address] PCP Tower, Laine LABOR, MD Patient Care Team: Tower, Laine LABOR, MD as PCP - General Perla, Evalene PARAS, MD as Consulting Physician (Cardiology) Fate Morna SAILOR, [facility] as Pharmacist (Pharmacist)  This Provider for this visit: Treatment Team:  Attending Provider: Geronimo Amel, MD    03/01/2023 -   Chief Complaint  Patient presents with   Follow-up    F/up on PFT     HPI Tina Berger 72 y.o. -returns for follow-up.  I could not get a good finger pulse oximetry on her because of her Raynaud's history.  Nevertheless she feels stable from a respiratory standpoint.  Her symptom score is 0.  Her main issue  is her rheumatoid arthritis.  She has had 3 falls since December 2023.  3 of them backwards.  The most recent one was February 14, 2023 when she slipped on a cat toy on the kitchen hardwood floor and she sustained a fracture to her left wrist but also significant bruising to her left side of the face.  In fact the bruising is still evident today.  She is working on her balance issues.  She plans to join a balance class.  She is in significant pain from her rheumatoid arthritis.  She continues on Actemra .  She is on prednisone  which she likes to get off.  She is also on pain medicine clinic.  She plans to transfer to rheumatology in [place] because this would be closer to her house.  But she is still plans to come here for pulmonary studies.  She had PFT  today shows a slight decline but it could be range bound.  She is willing for a closer follow-up.  Last CT scan of the chest was in April 2022.    OV 06/27/2023  Subjective:  Patient ID: Tina Berger, female , DOB: 1950/11/08 , age 18 y.o. , MRN: [id number] , ADDRESS: [address] [place] [address] PCP Tower, Laine LABOR, MD Patient Care Team: Tower, Laine LABOR, MD as PCP - General Perla, Evalene PARAS, MD as Consulting Physician (Cardiology) Fate Morna SAILOR, [facility] (Inactive) as Pharmacist (Pharmacist)  This Provider for this visit: Treatment Team:  Attending Provider: Geronimo Amel, MD    06/27/2023 -   Chief Complaint  Patient presents with   Follow-up    Breathing is overall doing well. She wakes up with some PND in the am. She denies cough. Would like a flu vaccine today.    Tina Berger 73 y.o. -presents for follow-up.  Since her last visit in May 2024.  She states the last fall was in April 2024.  She is dealing with the impact from the fall in terms of fractured teeth and dentition.  No further falls she is doing daily walks.  She continues 5 mg daily prednisone  she is also on Actemra .  She continues there is no side effects.  She says that overall her pain control has improved.  Her trigger finger is getting better and she is feeling better with the Actemra .  Dyspnea is the same.  Pulmonary function test done I reviewed results of the same.  There is no change in this.  Last CT scan of the chest was in 2022  Reviewed her vaccination.  She will have the high-dose flu shot today.  Noticed that her shingles vaccine is a live vaccine in 2015.  Since then she has been immunosuppressed.  Recommended Shingrix which is an inactivated vaccine.  Also recommended RSV vaccine.     CT Chest data from date: April 2022  - personally visualized and independently interpreted : NO - my findings are: as below  IMPRESSION: 1. The appearance of the lungs is compatible with interstitial  lung disease which is mildly progressive when compared to remote prior study from 9 years ago. The spectrum of findings on today's examination is categorized as probable usual interstitial pneumonia (UIP) per current ATS guidelines. 2. Aortic atherosclerosis, in addition to left main and 2 vessel coronary artery disease. Assessment for potential risk factor modification, dietary therapy or pharmacologic therapy may be warranted, if clinically indicated.   Aortic Atherosclerosis (ICD10-I70.0).     Electronically Signed   By: Toribio  Entrikin M.D.   On: 01/19/2021 18:20      OV 04/09/2024  Subjective:  Patient ID: Tina Berger, female , DOB: [birth year] , age 30 y.o. , MRN: [id number] , ADDRESS: [address] [place] [address] PCP Tower, Laine LABOR, MD Patient Care Team: Tower, Laine LABOR, MD as PCP - General Perla, Evalene PARAS, MD as Consulting Physician (Cardiology) Fate Morna SAILOR, [facility] (Inactive) as Pharmacist (Pharmacist)  This Provider for this visit: Treatment Team:  Attending Provider: Geronimo Amel, MD    04/09/2024 -   Chief Complaint  Patient presents with   Follow-up    PFT repeated today. Breathing is overall stable. She has some throat clearing and cough, esp at night- non prod.     Follow-up rheumatoid arthritis ILD -Mild disease burden  - Not on antifibrotic =-Last CT 2022 -Last PFT September 2024High risk prescription with  immunosuppressed status on Actemra  -since October 2022. Chronic 5mg  per day prednisone     HPI Tina Berger 73 y.o. -returns for follow-up.  Last visit September 2024.  History is gained from her and also review of the medical records.  Review of the medical records 03/13/2024 with Cheryl Birmingham rheumatology PA shows that patient underwent right Eye cataract removal February 07, 2024 left eye from 05/23/2024.  And in the context of this tocilizumab  was held.  It appears tocilizumab  has been held since March 2025 following a  right eye infection that subsequently resulted in the bilateral cataract surgeries.  Her vision has been restored and is pretty good.  Tocilizumab  has not been restarted now because of insurance issues and she is going to go generic it appears that she has gotten clearance.  Once that is started current prednisone  of 10 mg/day will be tapered down by 1 mg every 2 months.  From a respiratory standpoint symptom score is stable.  She did not do a sit/stand.  ILD according to radiology slightly worse in 2022.  The PFT seems to suggest that although the very mild decline definitely stable in the last 1 year.        SYMPTOM SCALE - ILD 06/02/2021  03/26/2022  06/27/2023  04/09/2024 Actermra on hold due to ey iessuea dn cost0  O2 use ra ra ra ra  Shortness of Breath 0 -> 5 scale with 5 being worst (score 6 If unable to do)     At rest 0 0 0 0  Simple tasks - showers, clothes change, eating, shaving 0 1 1 0  Household (dishes, doing bed, laundry) 0 2 2 3   Shopping 0 2 2 3   Walking level at own pace 0 2 2 1   Walking up Stairs 0 3 3 1   Total (30-36) Dyspnea Score 0 10 10 8   How bad is your cough? 0 0 1 mild  How bad is your fatigue yes 4 2 constant  How bad is nausea 0 1 1 Very low  How bad is vomiting?  0 0 0 0  How bad is diarrhea? 0 0 0 0  How bad is anxiety? ? 2 3 0  How bad is depression ? 2 0 0  Pain  5  Prob with feet     Simple office walk 185 feet x  3 laps goal with forehead probe 06/02/2021  03/26/2022  04/09/2024 refused  O2 used ra    Number laps completed 3    Comments about pace avg    Resting Pulse Ox/HR 97% and 71/min 95% and HR  80   Final Pulse Ox/HR 93% and 105/min 93% and HR 107   Desaturated </= 88% no no   Desaturated <= 3% points Yes 4 points Yes 2 points   Got Tachycardic >/= 90/min yes yes   Symptoms at end of test Moderate dyspnea Mild dyspnea    Miscellaneous comments x      CT Chest data from date:   - personally visualized and independently interpreted :  no - my findings are: below  IMPRESSION: 1. Spectrum of findings compatible with mild to moderate basilar predominant fibrotic interstitial lung disease with small focus of honeycombing, slightly progressed since 01/16/2021 CT. Findings are consistent with UIP per consensus guidelines: Diagnosis of Idiopathic Pulmonary Fibrosis: An Official ATS/ERS/JRS/ALAT Clinical Practice Guideline. Am JINNY Honey Crit Care Med Vol 198, Iss 5, [phone], Jun 18 2017. 2. Small layering left pleural effusion. 3. Chronic severe tracheobronchomalacia. 4. One vessel coronary atherosclerosis. 5. Chronic severe anterior T12 vertebral compression fracture. 6.  Aortic Atherosclerosis (ICD10-I70.0).     Electronically Signed   By: Selinda DELENA Blue M.D.   On: 01/20/2024 10:16    PFT     Latest Ref Rng & Units 04/09/2024   10:11 AM 06/23/2023   11:31 AM 03/01/2023    8:51 AM 03/26/2022   10:15 AM 06/02/2021   12:50 PM  PFT Results  FVC-Pre L 2.29  P 2.25  2.22  2.35  2.16   FVC-Predicted Pre % 96  P 93  87  91  79   FVC-Post L     2.14   FVC-Predicted Post %     78   Pre FEV1/FVC % % 79  P 83  83  85  81   Post FEV1/FCV % %     86   FEV1-Pre L 1.81  P 1.88  1.84  2.00  1.75   FEV1-Predicted Pre % 101  P 104  97  103  85   FEV1-Post L     1.83   DLCO uncorrected ml/min/mmHg 13.56  P 12.88  13.10  15.17  14.84   DLCO UNC% % 82  P 76  75  87  81   DLCO corrected ml/min/mmHg   13.99  14.70  14.84   DLCO COR %Predicted %   80  84  81   DLVA Predicted % 84  P 78  89  86  104   TLC L  4.00    4.29   TLC % Predicted %  89    90   RV % Predicted %  79    89     P Preliminary result       LAB RESULTS last 96 hours No results found.       has a past medical history of Acute renal insufficiency (05/31/2014), Anemia, iron deficiency, Anxiety, Bilateral lower extremity edema, Broken wrist, Cervical dysplasia, Colon cancer screening (06/14/2014), Coronary artery disease, Cough (08/08/2012), Degenerative disk  disease (11/19/2011), Depression, Diabetes mellitus type II, Diverticulosis, DJD (degenerative joint disease), Drug rash (05/22/2011), Dry eyes, Dysrhythmia, Edema, Elevated liver enzymes (03/21/2012), Encounter for routine gynecological examination (06/14/2014), ESOPHAGITIS (03/28/2007), Fibromyalgia, GASTRITIS (03/28/2007), GERD (gastroesophageal reflux disease), Hemorrhoids, HLD (hyperlipidemia), HNP (herniated nucleus pulposus) (11/1997), HTN (hypertension), Hyperglycemia (05/13/2008), Hypothyroidism, Interstitial lung disease (HCC), Left ovarian cyst, Osteoarthritis, Osteopenia, Other screening mammogram (08/18/2011), Palpitations, PERSONAL HISTORY ALLERGY UNSPEC MEDICINAL AGENT (03/18/2010), PONV (postoperative nausea and vomiting), Raynaud's disease, Recurrent HSV (herpes simplex virus), Rheumatoid arthritis (HCC), Rhinitis (03/21/2012), and Routine general  medical examination at a health care facility (03/28/2011).   reports that she quit smoking about 55 years ago. Her smoking use included cigarettes. She started smoking about 60 years ago. She has a 5 pack-year smoking history. She has been exposed to tobacco smoke. She has never used smokeless tobacco.  Past Surgical History:  Procedure Laterality Date   ABDOMINAL EXPLORATION SURGERY     ACDF  1991   BIOPSY OF SKIN SUBCUTANEOUS TISSUE AND/OR MUCOUS MEMBRANE Left 04/08/2021   Procedure: BIOPSY OF SKIN SUBCUTANEOUS TISSUE AND/OR MUCOUS MEMBRANE ( REMOVAL SKIN NODULE);  Surgeon: Jama Cordella MATSU, MD;  Location: [facility];  Service: Vascular;  Laterality: Left;   CATARACT EXTRACTION W/PHACO Right 02/07/2024   Procedure: PHACOEMULSIFICATION, CATARACT, WITH IOL INSERTION 8.20 00:48.7;  Surgeon: Jaye Fallow, MD;  Location: [facility];  Service: Ophthalmology;  Laterality: Right;   CATARACT EXTRACTION W/PHACO Left 02/21/2024   Procedure: PHACOEMULSIFICATION, CATARACT, WITH IOL INSERTION 5.42 00:40.0;  Surgeon: Jaye Fallow, MD;   Location: [facility];  Service: Ophthalmology;  Laterality: Left;   COLONOSCOPY  08/2000   Diverticulosis; hemorrhoids   HAND SURGERY Left    left thumb. PIN REMOVED   LASIK     bilateral    Allergies  Allergen Reactions   Ceftin  [Cefuroxime ] Swelling    Swelling, legs turn blue.  Legs swelling, then blue, then a rash   Clonidine  Derivatives     Swelling of lips   Erythromycin Swelling    Rash, swollen gums    Keflex  [Cephalexin ] Anaphylaxis    Chest was broken out in a rash. Lips were swelling. Took a few days to occur, but it kept getting worse.                                                                                                                                                                                                   Penicillins Anaphylaxis    Pt had a daughter with an allergy to this at a young age. Patient developed blisters anywhere her child touched her. Also, if she used the bathroom after daughter did while on keflex , it would cause a reaction for her. Lips and roof of mouth swelled up also.   Sulfonamide Derivatives Swelling    Rash, swollen gums, lips   Ciprofloxacin     REACTION: ? rash vs sun rxn. Rash    Fluoxetine Hcl     REACTION: stomach problems. Severe pain in abdomen. Could not function d/t pain   Furosemide Swelling    REACTION: swelling worsened in legs once taking   Gabapentin Other (  See Comments)    REACTION: edema of feet. Unable to get shoes on   Hydroxychloroquine  Itching    Pt report itching felt like pre-anaphylaxis she has had with other medications   Paroxetine     REACTION: weight gain (30 pound weight gain   Pregabalin     REACTION: swelling of feet and legs. Unable to get shoes on.   Tetracycline     REACTION:inflammed genitals.    Venlafaxine     REACTION: sweating profusely   Etodolac     REACTION: reaction not known   Macrobid  [Nitrofurantoin ]     REACTION: ? Lung problem    Ramipril      Hyperkalemia     Amitriptyline Hcl     REACTION: sedating   Atorvastatin     REACTION: muscle twitch and pain   Benicar  [Olmesartan  Medoxomil]     Muscle pain    Cephalexin  Hives and Rash   Cetirizine Hcl     REACTION: headache   Diovan [Valsartan]     Thought it made her feel confused   Naproxen Sodium Other (See Comments)    REACTION: edema of feet and legs.  Not good for ckd    Immunization History  Administered Date(s) Administered   Fluad Quad(high Dose 65+) 08/07/2020, 07/13/2022   Fluad Trivalent(High Dose 65+) 06/27/2023   Influenza Split 06/19/2011   Influenza Whole 08/01/2009   Influenza, High Dose Seasonal PF 08/14/2018, 07/03/2019   Influenza,inj,Quad PF,6+ Mos 06/14/2014, 08/25/2015, 07/26/2016, 08/19/2017   Moderna Sars-Covid-2 Vaccination 09/17/2020   PFIZER(Purple Top)SARS-COV-2 Vaccination 11/27/2019, 12/18/2019   Pneumococcal Conjugate-13 08/25/2015   Pneumococcal Polysaccharide-23 08/25/2016   Td 11/25/2003   Tdap 06/14/2014   Zoster, Live 09/16/2014    Family History  Problem Relation Age of Onset   Lung cancer Mother        + smoker   Coronary artery disease Mother        relatively young   Emphysema Father        + smoker   Lymphoma Sister    Heart disease Brother    Lymphoma Brother    Parkinson's disease Brother    Anemia Brother        aplastic    Diabetes Brother    Iron deficiency Daughter    Leukemia Son    Breast cancer Neg Hx      Current Outpatient Medications:    Acetaminophen  (TYLENOL  ARTHRITIS PAIN PO), Take 650 mg by mouth daily., Disp: , Rfl:    amLODipine  (NORVASC ) 5 MG tablet, Take 1 tablet (5 mg total) by mouth daily., Disp: 90 tablet, Rfl: 0   buPROPion  (WELLBUTRIN  XL) 150 MG 24 hr tablet, TAKE 1 TABLET BY MOUTH DAILY, Disp: 90 tablet, Rfl: 2   Chlorphen-Phenyleph-ASA (ALKA-SELTZER PLUS COLD PO), Take by mouth as needed. Just for colds, Disp: , Rfl:    cholecalciferol (VITAMIN D3) 25 MCG (1000 UNIT) tablet, Take 1,000 Units by mouth  daily., Disp: , Rfl:    cyclobenzaprine  (FLEXERIL ) 5 MG tablet, Take 5 mg by mouth daily as needed., Disp: , Rfl:    Diclofenac  Sodium 1.5 % SOLN, Place 2 mLs onto the skin 4 (four) times daily., Disp: 3 Bottle, Rfl: 3   DULoxetine  (CYMBALTA ) 60 MG capsule, TAKE 1 CAPSULE BY MOUTH EVERY DAY, Disp: 90 capsule, Rfl: 3   esomeprazole  (NEXIUM ) 20 MG capsule, TAKE 1 CAPSULE BY MOUTH DAILY, Disp: 90 capsule, Rfl: 2   famotidine  (PEPCID ) 40 MG tablet, TAKE 1 TABLET BY MOUTH  DAILY, Disp: 90 tablet, Rfl: 2   ferrous sulfate 325 (65 FE) MG EC tablet, Take 325 mg by mouth daily with breakfast., Disp: , Rfl:    glucose blood test strip, One Touch Ultra stripts blue-To check sugar once daily and as needed for DM2 250.00, Disp: 100 each, Rfl: 3   GUAIFENESIN CR PO, Take by mouth as needed., Disp: , Rfl:    HYDROmorphone HCl (EXALGO) 12 MG T24A SR tablet, Take 12 mg by mouth 2 (two) times daily., Disp: , Rfl:    hyoscyamine  (LEVSIN  SL) 0.125 MG SL tablet, Take 1 tablet (0.125 mg total) by mouth 2 (two) times daily as needed., Disp: , Rfl:    ketoconazole (NIZORAL) 2 % shampoo, SHAMPOO WITH A SMALL AMOUNT AS DIRECTED THREE TIMES A WEEK SHAMPOO SCALP 3 DAYS A WEEK, LET SHAMPOO SIT FOR 10 MINUTES AND RINSE OFF, Disp: 120 mL, Rfl: 2   levothyroxine  (SYNTHROID ) 125 MCG tablet, TAKE 1 TABLET BY MOUTH DAILY  BEFORE BREAKFAST, Disp: 90 tablet, Rfl: 2   LYSINE PO, Take by mouth as needed. OTC for ulcer prophylaxis, Disp: , Rfl:    Magnesium  400 MG CAPS, Take by mouth daily., Disp: , Rfl:    metFORMIN  (GLUCOPHAGE -XR) 500 MG 24 hr tablet, TAKE 2 TABLETS BY MOUTH TWICE  DAILY WITH A MEAL, Disp: 360 tablet, Rfl: 2   metoprolol  succinate (TOPROL -XL) 50 MG 24 hr tablet, TAKE 1 TABLET BY MOUTH TWICE  DAILY, Disp: 180 tablet, Rfl: 2   MIEBO 1.338 GM/ML SOLN, , Disp: , Rfl:    naloxone (NARCAN) nasal spray 4 mg/0.1 mL, , Disp: , Rfl:    ondansetron  (ZOFRAN ) 4 MG tablet, Take 1 tablet (4 mg total) by mouth every 8 (eight) hours as  needed for nausea or vomiting., Disp: 20 tablet, Rfl: 0   oxyCODONE  (OXYCONTIN ) 10 MG 12 hr tablet, Take 10 mg by mouth 3 (three) times daily as needed. Beakthrough pain, Disp: , Rfl:    prednisoLONE acetate (PRED FORTE) 1 % ophthalmic suspension, INSTILL 1 DROP INTO RIGHT EYE FOUR TIMES A DAY TAPER AS INSTRUCTED, Disp: , Rfl:    predniSONE  (DELTASONE ) 10 MG tablet, TAKE 1 TABLET (10 MG TOTAL) BY MOUTH DAILY WITH BREAKFAST., Disp: 30 tablet, Rfl: 1   rosuvastatin  (CRESTOR ) 5 MG tablet, TAKE 1 TABLET BY MOUTH TWICE  WEEKLY, Disp: 29 tablet, Rfl: 2   tiZANidine (ZANAFLEX) 4 MG tablet, Take 4 mg by mouth every 8 (eight) hours as needed for muscle spasms., Disp: , Rfl:    Tocilizumab  (ACTEMRA  ACTPEN) 162 MG/0.9ML SOAJ, Inject 162 mg into the skin every 14 (fourteen) days., Disp: 5.4 mL, Rfl: 0   vitamin B-12 (CYANOCOBALAMIN ) 500 MCG tablet, Take 500 mcg by mouth daily., Disp: , Rfl:    XTAMPZA  ER 36 MG C12A, Take 1 capsule by mouth every 12 (twelve) hours., Disp: , Rfl:    Xylitol (XYLIMELTS MT), Use as directed in the mouth or throat., Disp: , Rfl:    ramipril  (ALTACE ) 5 MG capsule, TAKE 1 CAPSULE BY MOUTH DAILY, Disp: 90 capsule, Rfl: 2 No current facility-administered medications for this visit.  Facility-Administered Medications Ordered in Other Visits:    albuterol  (PROVENTIL ) (2.5 MG/3ML) 0.083% nebulizer solution 2.5 mg, 2.5 mg, Nebulization, Once, Geronimo Amel, MD      Objective:   Vitals:   04/09/24 1117  BP: 124/76  Pulse: 79  SpO2: 93%  Weight: 176 lb (79.8 kg)  Height: 5' (1.524 m)    Estimated body  mass index is 34.37 kg/m as calculated from the following:   Height as of this encounter: 5' (1.524 m).   Weight as of this encounter: 176 lb (79.8 kg).  @WEIGHTCHANGE @  [employer]   04/09/24 1117  Weight: 176 lb (79.8 kg)     Physical Exam   General: No distress.  Obese and looks deconditioned O2 at rest: no Cane present: no Sitting in wheel chair:  no Frail: no Obese: yes Neuro: Alert and Oriented x 3. GCS 15. Speech normal Psych: Pleasant Resp:  Barrel Chest - no.  Wheeze - no, Crackles - no, No overt respiratory distress CVS: Normal heart sounds. Murmurs - no Ext: Stigmata of Connective Tissue Disease - no HEENT: Normal upper airway. PEERL +. No post nasal drip        Assessment:       ICD-10-CM   1. Interstitial lung disease due to connective tissue disease (HCC)  J84.89 Pulmonary function test   M35.9 Pulmonary function test         Plan:     Patient Instructions  Interstitial lung disease due to connective tissue disease (HCC) Drug allergy, multiple  Rheumatoid arthritis of multiple sites with negative rheumatoid factor (HCC) Raynaud's disease without gangrene  -You have very mild interstitial lung disease and this is due to rheumatoid arthritis   - [based on symptoms, walk test, pulmonary function test and CT scan] -Since 2013 there is very mild progression through 2022 - There is also progression on CT very mild 2022 -> 2025 but PFT stable 2024 -> 2025   Plan  - continue prednisone  for RA - restart generic actemra  - hold off  pulmonary anti72fibrotic due to side effect profile but if there is progression then yes strongly consider it - do spirometry and dlco in 4- months   Follow-up - 4-5 months do spirometry and DLCO  on return to see Dr. Geronimo for follow-up in a 15-minute slot  -Symptom score and simple walking desaturation test at follow-up   FOLLOWUP Return in about 18 weeks (around 08/13/2024) for 15 min visit, after Spiro and DLCO, with Dr Geronimo, Face to Face Visit.  (Level 04 E&M 2024: Estb >= 30 min visit spent in total care time and counseling or/and coordination of care by this undersigned MD - Dr Dorethia Geronimo. This includes one or more of the following on this same day 04/09/2024: pre-charting, chart review, note writing, documentation discussion of test results, diagnostic or  treatment recommendations, prognosis, risks and benefits of management options, instructions, education, compliance or risk-factor reduction. It excludes time spent by the CMA or office staff in the care of the patient . Actual time is 30 min)   SIGNATURE    Dr. Dorethia Geronimo, M.D., F.C.C.P,  Pulmonary and Critical Care Medicine Staff Physician, [facility] Director - Interstitial Lung Disease  Program  Pulmonary Fibrosis [facility] at [facility] [place], [place], [address]  Pager: [phone], If no answer or between  15:00h - 7:00h: call [phone] Telephone: [phone]  2:20 PM 04/09/2024=

## 2024-04-09 NOTE — Patient Instructions (Addendum)
 Interstitial lung disease due to connective tissue disease (HCC) Drug allergy, multiple  Rheumatoid arthritis of multiple sites with negative rheumatoid factor (HCC) Raynaud's disease without gangrene  -You have very mild interstitial lung disease and this is due to rheumatoid arthritis   - [based on symptoms, walk test, pulmonary function test and CT scan] -Since 2013 there is very mild progression through 2022 - There is also progression on CT very mild 2022 -> 2025 but PFT stable 2024 -> 2025   Plan  - continue prednisone  for RA - restart generic actemra  - hold off  pulmonary anti33fibrotic due to side effect profile but if there is progression then yes strongly consider it - do spirometry and dlco in 4- months   Follow-up - 4-5 months do spirometry and DLCO  on return to see Dr. Geronimo for follow-up in a 15-minute slot  -Symptom score and simple walking desaturation test at follow-up

## 2024-04-09 NOTE — Progress Notes (Signed)
Spiro/ DLCO completed today  

## 2024-04-09 NOTE — Patient Instructions (Signed)
Spiro/ DLCO completed today  

## 2024-04-09 NOTE — Telephone Encounter (Signed)
 Last Fill: 02/14/2024  Next Visit: 06/12/2024  Last Visit: 03/13/2024  Dx: Rheumatoid arthritis of multiple sites with negative rheumatoid factor   Current Dose per office note on 03/13/2024: prednisone  10 mg daily   Okay to refill Prednisone ?

## 2024-04-12 ENCOUNTER — Telehealth: Payer: Self-pay

## 2024-04-12 MED ORDER — TYENNE 162 MG/0.9ML ~~LOC~~ SOAJ
162.0000 mg | SUBCUTANEOUS | 0 refills | Status: DC
Start: 2024-04-12 — End: 2024-07-02
  Filled 2024-04-16: qty 1.8, 28d supply, fill #0
  Filled 2024-05-04 (×2): qty 1.8, 28d supply, fill #1
  Filled 2024-05-30 (×2): qty 1.8, 28d supply, fill #2

## 2024-04-12 MED ORDER — TYENNE 162 MG/0.9ML ~~LOC~~ SOAJ: 162.0000 mg | SUBCUTANEOUS | 0 refills | Status: DC

## 2024-04-12 NOTE — Telephone Encounter (Signed)
 Patient contacted the office. She wanted to let [facility] know she can go ahead and submit for the new medication.

## 2024-04-12 NOTE — Telephone Encounter (Signed)
 Rx for Tyenne 162mg  subcut every 14 days sent to [facility] for onboarding  Sherry Pennant, PharmD, MPH, BCPS, CPP Clinical Pharmacist (Rheumatology and Pulmonology)

## 2024-04-13 ENCOUNTER — Other Ambulatory Visit (HOSPITAL_COMMUNITY): Payer: Self-pay

## 2024-04-13 ENCOUNTER — Other Ambulatory Visit: Payer: Self-pay

## 2024-04-13 NOTE — Progress Notes (Signed)
 Patient switching from Actemra  to Tyenne  due to no longer qualifying for PAP. She recently had surgery and has been cleared to resume immunosuppressive treatment  Continue Tyenne  162mg  every 14 days  Sherry Pennant, PharmD, MPH, BCPS, CPP Clinical Pharmacist (Rheumatology and Pulmonology)

## 2024-04-16 ENCOUNTER — Other Ambulatory Visit: Payer: Self-pay

## 2024-04-16 ENCOUNTER — Other Ambulatory Visit (HOSPITAL_COMMUNITY): Payer: Self-pay

## 2024-04-16 NOTE — Progress Notes (Signed)
 Specialty Pharmacy Initial Fill Coordination Note  Tina Berger is a 73 y.o. female contacted today regarding initial fill of specialty medication(s) Tocilizumab -aazg (Tyenne )   Patient requested Delivery   Delivery date: 04/18/24   Verified address: [address]   [place] [address]   Medication will be filled on 04/17/24.   Patient is aware of $103.92 copayment. Payment info collected and forwarded to Raimon at [facility].

## 2024-04-17 ENCOUNTER — Other Ambulatory Visit: Payer: Self-pay

## 2024-04-19 DIAGNOSIS — M4725 Other spondylosis with radiculopathy, thoracolumbar region: Secondary | ICD-10-CM | POA: Diagnosis not present

## 2024-04-19 DIAGNOSIS — K5903 Drug induced constipation: Secondary | ICD-10-CM | POA: Diagnosis not present

## 2024-04-19 DIAGNOSIS — M961 Postlaminectomy syndrome, not elsewhere classified: Secondary | ICD-10-CM | POA: Diagnosis not present

## 2024-04-19 DIAGNOSIS — M5134 Other intervertebral disc degeneration, thoracic region: Secondary | ICD-10-CM | POA: Diagnosis not present

## 2024-04-19 DIAGNOSIS — G894 Chronic pain syndrome: Secondary | ICD-10-CM | POA: Diagnosis not present

## 2024-04-19 DIAGNOSIS — Z79891 Long term (current) use of opiate analgesic: Secondary | ICD-10-CM | POA: Diagnosis not present

## 2024-04-19 DIAGNOSIS — M5451 Vertebrogenic low back pain: Secondary | ICD-10-CM | POA: Diagnosis not present

## 2024-05-04 ENCOUNTER — Other Ambulatory Visit: Payer: Self-pay

## 2024-05-04 ENCOUNTER — Other Ambulatory Visit: Payer: Self-pay | Admitting: Family Medicine

## 2024-05-04 ENCOUNTER — Other Ambulatory Visit (HOSPITAL_COMMUNITY): Payer: Self-pay

## 2024-05-04 DIAGNOSIS — E119 Type 2 diabetes mellitus without complications: Secondary | ICD-10-CM

## 2024-05-04 NOTE — Progress Notes (Signed)
 Specialty Pharmacy Refill Coordination Note  Tina Berger is a 73 y.o. female contacted today regarding refills of specialty medication(s) Tocilizumab -aazg (Tyenne )   Patient requested Delivery   Delivery date: 05/08/24   Verified address: [address]   [place] [address]   Medication will be filled on 05/07/24.

## 2024-05-07 ENCOUNTER — Other Ambulatory Visit: Payer: Self-pay

## 2024-05-10 NOTE — Telephone Encounter (Signed)
 I called patient, patient wants to discuss different treatment options.

## 2024-05-10 NOTE — Telephone Encounter (Signed)
 Different treatment options can be discussed at upcoming visit if she does not want to retry arava  at this time.

## 2024-05-11 ENCOUNTER — Other Ambulatory Visit: Payer: Self-pay | Admitting: Family Medicine

## 2024-05-28 ENCOUNTER — Encounter: Payer: Self-pay | Admitting: Family Medicine

## 2024-05-29 NOTE — Progress Notes (Signed)
 Office Visit Note  Patient: Tina Berger             Date of Birth: [birth year]           MRN: [id number]             PCP: Randeen Laine LABOR, MD Referring: Tower, Laine LABOR, MD Visit Date: 06/12/2024 Occupation: @GUAROCC @  Subjective:  Medication management   History of Present Illness: Tina Berger is a 73 y.o. female with seronegative rheumatoid arthritis and ILD.  She states she had right eye cataract surgery in April and left eye in May.  She has recovered well from the surgery and her vision has improved .  Was off Biologics for about 3 months.  She started having a flare of rheumatoid arthritis.  She is started Tyenne  about 2 months ago.  She is on Tyenne  (tocilizumab  biosimilar) 162 mg every 14 days.  She has had 4 shots and has noted improvement in her symptoms.  She continues to have some discomfort in her shoulders and her hands.  She noted some improvement after the left shoulder joint injection in May 2025.  She was evaluated by Dr. Silva for hammertoes.  Patient states he recommended foot surgery.  She would like to hold off for right now.  She continues to have some discomfort in her entire spine.  She also has generalized pain from fibromyalgia.  She has been taking vitamin D .    Activities of Daily Living:  Patient reports morning stiffness for 4 hours.   Patient Reports nocturnal pain.  Difficulty dressing/grooming: Reports Difficulty climbing stairs: Denies Difficulty getting out of chair: Reports Difficulty using hands for taps, buttons, cutlery, and/or writing: Reports  Review of Systems  Constitutional:  Positive for fatigue.  HENT:  Positive for mouth dryness. Negative for mouth sores.   Eyes:  Positive for dryness.  Respiratory:  Negative for shortness of breath.   Cardiovascular:  Negative for chest pain and palpitations.  Gastrointestinal:  Positive for constipation. Negative for blood in stool and diarrhea.  Endocrine: Negative for increased urination.   Genitourinary:  Positive for involuntary urination.  Musculoskeletal:  Positive for joint pain, gait problem, joint pain, joint swelling, myalgias, morning stiffness, muscle tenderness and myalgias. Negative for muscle weakness.  Skin:  Negative for color change, rash, hair loss and sensitivity to sunlight.  Allergic/Immunologic: Negative for susceptible to infections.  Neurological:  Positive for dizziness and headaches.  Hematological:  Negative for swollen glands.  Psychiatric/Behavioral:  Negative for depressed mood and sleep disturbance. The patient is not nervous/anxious.     PMFS History:  Patient Active Problem List   Diagnosis Date Noted   Hyperkalemia 03/27/2024   Aortic atherosclerosis (HCC) 09/12/2023   Skin lesion 09/12/2023   Frequent falls 12/29/2022   Dysuria 09/07/2021   Rheumatoid arthritis (HCC) 05/27/2021   Chronic venous insufficiency 05/23/2021   AVM (arteriovenous malformation) 04/04/2021   CAD (coronary artery disease) 01/22/2021   Primary osteoarthritis of both knees 01/12/2021   Positive ANA (antinuclear antibody) 11/17/2020   Joint pain 11/13/2020   Current use of proton pump inhibitor 08/26/2020   Estrogen deficiency 08/21/2018   TMJ (dislocation of temporomandibular joint), initial encounter 05/05/2018   Anterior neck pain 05/05/2018   Chronic pain syndrome 11/09/2017   Chronic upper extremity pain [facility] Area of Pain) (Bilateral) (L>R) 11/09/2017   Fibromyalgia syndrome 11/09/2017   Osteoarthritis 11/09/2017   Osteoarthritis of lumbar facet joint (Bilateral) 11/09/2017   Lumbar facet arthropathy (Bilateral)  11/09/2017   Lumbar facet syndrome (Bilateral) (L>R) 11/09/2017   Lumbar foraminal stenosis (multilevel) (Bilateral) 11/09/2017   DDD (degenerative disc disease), lumbar 11/09/2017   Thoracic levoscoliosis 11/09/2017   Thoracic facet syndrome (Bilateral) (L>R) 11/09/2017   Thoracic facet arthropathy (Bilateral) (R>L) 11/09/2017   DDD  (degenerative disc disease), thoracic 11/09/2017   Thoracolumbar IVDD 11/09/2017   DDD (degenerative disc disease), cervical 11/09/2017   Osteoarthritis of cervical facet (Bilateral) 11/09/2017   Grade 1 Anterolisthesis of C7 over T1 11/09/2017   Cervical foraminal stenosis (Bilateral) 11/09/2017   History of fusion of cervical spine (C5-6 ACDF) 11/09/2017   Cervical facet syndrome (Bilateral) 11/09/2017   Disorder of skeletal system 11/09/2017   Cervical radiculitis (Bilateral) 11/09/2017   Lumbar Epidural lipomatosis 11/09/2017   Chronic sacroiliac joint pain (Bilateral) (L>R) 11/09/2017   Chronic hip pain (Bilateral) (L>R) 11/09/2017   Chronic upper back pain (Primary Area of Pain) (midline) 10/24/2017   Chronic neck pain (Secondary Area of Pain) (Bilateral)  (L>R) 10/24/2017   Chronic low back pain (Fourth Area of Pain) (Bilateral) (L>R) 10/24/2017   Chronic lower extremity pain (Fifth Area of Pain) (Bilateral) (L>R) 10/24/2017   Lumbar Grade 1 Retrolisthesis of L1-2 and L2-3 10/24/2017   Other long term (current) drug therapy 10/24/2017   Other specified health status 10/24/2017   Long term current use of opiate analgesic 10/24/2017   Long term prescription opiate use 10/24/2017   Opiate use 10/24/2017   DM type 2 (diabetes mellitus, type 2) (HCC) 06/14/2014   Pharmacologic therapy 06/14/2014   Pedal edema    Post-menopausal 06/23/2012   Lumbar disc disease with radiculopathy 11/18/2011   HTN (hypertension) 07/30/2011   Raynaud disease 07/30/2011   Obesity 07/30/2011   Routine general medical examination at a health care facility 03/28/2011   Palpitations 04/27/2010   Depression with anxiety 06/14/2008   Vitamin D  deficiency 05/13/2008   Osteopenia 03/25/2008   Hypothyroidism 03/28/2007   Hyperlipidemia associated with type 2 diabetes mellitus (HCC) 03/28/2007   Other iron deficiency anemias 03/28/2007   PANIC ATTACK 03/28/2007   KERATOCONJUNCTIVITIS SICCA 03/28/2007    Mitral valve disorder 03/28/2007   GERD 03/28/2007   IBS 03/28/2007   Rosacea 03/28/2007   PLANTAR FASCIITIS 03/28/2007   MIGRAINES, HX OF 03/28/2007    Past Medical History:  Diagnosis Date   Acute renal insufficiency 05/31/2014   pt not aware of this diagnosis   Anemia, iron deficiency    Anxiety    Bilateral lower extremity edema    a. uses torsemide    Broken wrist    did not require surgery   Cervical dysplasia    abnormal paps   Colon cancer screening 06/14/2014   Coronary artery disease    Cough 08/08/2012   Degenerative disk disease 11/19/2011   Depression    Diabetes mellitus type II    Diverticulosis    DJD (degenerative joint disease)    Drug rash 05/22/2011   Dry eyes    Dysrhythmia    metoprolol .   Edema    Elevated liver enzymes 03/21/2012   Encounter for routine gynecological examination 06/14/2014   ESOPHAGITIS 03/28/2007   Qualifier: Hospitalized for  By: Cecilie CMA, NANNIE Ivy     Fibromyalgia    GASTRITIS 03/28/2007   Qualifier: History of  By: Cecilie CMA, NANNIE Ivy     GERD (gastroesophageal reflux disease)    Hemorrhoids    external   HLD (hyperlipidemia)    HNP (herniated nucleus pulposus) 11/1997   T6,7,8  with DJD   HTN (hypertension)    Hyperglycemia 05/13/2008   Qualifier: Diagnosis of  By: Randeen MD, Laine Caldron    Hypothyroidism    Interstitial lung disease (HCC)    pt not aware of this   Left ovarian cyst    x 3, rupture   Osteoarthritis    hands   Osteopenia    mild-11/01; improved 12/05   Other screening mammogram 08/18/2011   Palpitations    PERSONAL HISTORY ALLERGY UNSPEC MEDICINAL AGENT 03/18/2010   Qualifier: Diagnosis of  By: Randeen MD, Laine Caldron    PONV (postoperative nausea and vomiting)    Raynaud's disease    Recurrent HSV (herpes simplex virus)    lesions in nose or mouth with frequent ulcers. d/t meds and dry mouth   Rheumatoid arthritis (HCC)    Rhinitis 03/21/2012   Routine general medical examination at a  health care facility 03/28/2011    Family History  Problem Relation Age of Onset   Lung cancer Mother        + smoker   Coronary artery disease Mother        relatively young   Emphysema Father        + smoker   Lymphoma Sister    Heart disease Brother    Lymphoma Brother    Parkinson's disease Brother    Anemia Brother        aplastic    Diabetes Brother    Iron deficiency Daughter    Leukemia Son    Breast cancer Neg Hx    Past Surgical History:  Procedure Laterality Date   ABDOMINAL EXPLORATION SURGERY     ACDF  1991   BIOPSY OF SKIN SUBCUTANEOUS TISSUE AND/OR MUCOUS MEMBRANE Left 04/08/2021   Procedure: BIOPSY OF SKIN SUBCUTANEOUS TISSUE AND/OR MUCOUS MEMBRANE ( REMOVAL SKIN NODULE);  Surgeon: Jama Cordella MATSU, MD;  Location: [facility];  Service: Vascular;  Laterality: Left;   CATARACT EXTRACTION W/PHACO Right 02/07/2024   Procedure: PHACOEMULSIFICATION, CATARACT, WITH IOL INSERTION 8.20 00:48.7;  Surgeon: Jaye Fallow, MD;  Location: [facility];  Service: Ophthalmology;  Laterality: Right;   CATARACT EXTRACTION W/PHACO Left 02/21/2024   Procedure: PHACOEMULSIFICATION, CATARACT, WITH IOL INSERTION 5.42 00:40.0;  Surgeon: Jaye Fallow, MD;  Location: [facility];  Service: Ophthalmology;  Laterality: Left;   COLONOSCOPY  08/2000   Diverticulosis; hemorrhoids   HAND SURGERY Left    left thumb. 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   Lives with husband. Feels safe in her home.   Immunization History  Administered Date(s) Administered   Fluad Quad(high Dose 65+) 08/07/2020, 07/13/2022   Fluad Trivalent(High Dose 65+) 06/27/2023   INFLUENZA, HIGH DOSE SEASONAL PF 08/14/2018, 07/03/2019   Influenza Split 06/19/2011   Influenza Whole 08/01/2009   Influenza,inj,Quad PF,6+ Mos 06/14/2014, 08/25/2015, 07/26/2016,  08/19/2017   Moderna Sars-Covid-2 Vaccination 09/17/2020   PFIZER(Purple Top)SARS-COV-2 Vaccination 11/27/2019, 12/18/2019   Pneumococcal Conjugate-13 08/25/2015   Pneumococcal Polysaccharide-23 08/25/2016   Td 11/25/2003   Tdap 06/14/2014   Zoster, Live 09/16/2014     Objective: Vital Signs: BP 135/83 (BP Location: Left Arm, Patient Position: Sitting, Cuff Size: Normal)   Pulse 75   Resp 14   Ht 5' (1.524 m)   Wt 180 lb (81.6 kg)   BMI 35.15 kg/m    Physical Exam Vitals and  nursing note reviewed.  Constitutional:      Appearance: She is well-developed.  HENT:     Head: Normocephalic and atraumatic.  Eyes:     Conjunctiva/sclera: Conjunctivae normal.  Cardiovascular:     Rate and Rhythm: Normal rate and regular rhythm.     Heart sounds: Normal heart sounds.  Pulmonary:     Effort: Pulmonary effort is normal.     Breath sounds: Normal breath sounds.  Abdominal:     General: Bowel sounds are normal.     Palpations: Abdomen is soft.  Musculoskeletal:     Cervical back: Normal range of motion.  Lymphadenopathy:     Cervical: No cervical adenopathy.  Skin:    General: Skin is warm and dry.     Capillary Refill: Capillary refill takes less than 2 seconds.  Neurological:     Mental Status: She is alert and oriented to person, place, and time.  Psychiatric:        Behavior: Behavior normal.      Musculoskeletal Exam: She had limited range of motion of the cervical spine.  Thoracic kyphosis was noted.  Lumbar spine was difficult to assess and had limited range of motion with discomfort.  Shoulder joints with good range of motion with some discomfort on range of motion of her left shoulder.  Elbow joints, wrist joints with good range of motion.  She has some synovitis in bilateral second MCPs and PIPs.  Hip joints and knee joints in good range of motion.  There is no tenderness over her ankles or MTPs.  Bilateral hammertoes were noted.  CDAI Exam: CDAI Score: 15  Patient  Global: 30 / 100; Provider Global: 30 / 100 Swollen: 4 ; Tender: 5  Joint Exam 06/12/2024      Right  Left  Glenohumeral      Tender  MCP 2  Swollen Tender  Swollen Tender  PIP 2 (finger)  Swollen Tender  Swollen Tender     Investigation: No additional findings.  Imaging: No results found.  Recent Labs: Lab Results  Component Value Date   WBC 9.7 03/13/2024   HGB 12.7 03/13/2024   PLT 462 (H) 03/13/2024   NA 138 03/27/2024   K 5.1 03/27/2024   CL 96 (L) 03/27/2024   CO2 30 03/27/2024   GLUCOSE 88 03/27/2024   BUN 21 03/27/2024   CREATININE 0.71 03/27/2024   BILITOT 0.2 03/13/2024   ALKPHOS 48 09/05/2023   AST 12 03/13/2024   ALT 11 03/13/2024   PROT 7.4 03/13/2024   ALBUMIN 4.0 09/05/2023   CALCIUM  9.1 03/27/2024   GFRAA 75 03/24/2021   QFTBGOLDPLUS NEGATIVE 03/13/2024    Speciality Comments: Arava - mood disorder HCQ-vomiting  Procedures:  No procedures performed Allergies: Ceftin  [cefuroxime ], Clonidine  derivatives, Erythromycin, Keflex  [cephalexin ], Macrobid  [nitrofurantoin ], Penicillins, Sulfonamide derivatives, Ciprofloxacin, Fluoxetine hcl, Furosemide, Gabapentin, Hydroxychloroquine , Paroxetine, Pregabalin, Tetracycline, Venlafaxine, Etodolac, Ramipril , Amitriptyline hcl, Atorvastatin, Benicar  [olmesartan  medoxomil], Cephalexin , Cetirizine hcl, Diovan [valsartan], and Naproxen sodium   Assessment / Plan:     Visit Diagnoses: Rheumatoid arthritis of multiple sites with negative rheumatoid factor (HCC)-patient was off to status MS for about 4 months and develops a flare.  She states that after her second cataract surgery in May 2025 when she recovered she started on tocilizumab  biosimilar.  She has had 4 shots so far.  She has noticed improvement in her joint symptoms.  She continues to have some discomfort in her shoulders and her bilateral hands.  She has noticed some swelling  in her hands.  None of the other joints are is painful.  She states she has discomfort  in her feet but not much swelling.  High risk medication use - Tyenne  (tocilizumab  biosimilar) 162 mg subcutaneous injections every 14 days and prednisone  10 mg daily.  Plan to try tapering prednisone  by 1 mg every 2 months. -Mar 13, 2024 CBC and CMP was stable.  TB Gold was negative on Mar 13, 2024.  Will get labs today and every 3 months to monitor for drug toxicity.  Information organization was placed in the AVS.  She was advised to hold tocilizumab  biosimilar if she develops an infection resume after the infection resolves.  Plan: CBC with Differential/Platelet, Comprehensive metabolic panel with GFR  Long-term use of systemic steroids-patient  had several flares of rheumatoid arthritis in the last few months.  She had multiple prednisone  taper and had been on prednisone  10 mg p.o. daily recently.  I did detailed discussion with the patient and her husband about tapering prednisone .  They are hesitant to decrease the prednisone  dose as she is still having discomfort.  They are ready to try tapering after 1 week.  Will send a prescription for prednisone  5 mg p.o. daily.  I advised her to reduce the dose of prednisone  to 7.5 mg milligram p.o. daily after a week and then to 5 mg p.o. daily the following week if tolerated.  She may have to stay on prednisone  5 mg p.o. daily.  Side effects of long-term prednisone  use including weight gain, diabetes, hypertension, heart disease were discussed.  ILD (interstitial lung disease) (HCC) - Evaluated by Dr. Geronimo on 03/01/2023: Very mild ILD: based on symptoms, walk test, pulmonary function test and CT scan.  Raynaud's disease without gangrene-currently not active.  Chronic left shoulder pain-she continues to have discomfort.  Primary osteoarthritis of both knees-she good range of motion without discomfort.  DDD (degenerative disc disease), cervical-she had limited range of motion.  History of fusion of cervical spine (C5-6 ACDF)  DDD (degenerative disc  disease), thoracic - Thoracic kyphosis noted.  Spondylosis of lumbar spine-she continues to have chronic discomfort in her lower back.  Fibromyalgia syndrome -she continues to have generalized pain and discomfort.  She remains on Cymbalta  as prescribed.  She takes Flexeril  as needed for muscle spasms.  She remains under the care of pain management.  Chronic pain syndrome - She remains under the care of pain management.  Long term current use of opiate analgesic - Under the care of pain management.  Osteopenia of multiple sites - DEXA 04/19/2023 T-score -2.1 right total femur, BMD 0.745.History of vertebral fracture and wrist fracture---would benefit from use of anablic agent.  History of vertebral fracture - Patient would benefit from the use of an anabolic agent.  History of wrist fracture - Left-fall 02/13/23.  Vitamin D  deficiency -vitamin D  was 50 on March 22, 2023.  Plan: VITAMIN D  25 Hydroxy (Vit-D Deficiency, Fractures)  Chronic renal impairment, stage 3a (HCC)-creatinine was normal recently.  Other medical problems listed as follows:  Type 2 diabetes mellitus without complication, without long-term current use of insulin (HCC)  History of hypothyroidism  Depression with anxiety  Hx of arteriovenous malformation (AVM)  History of IBS  Primary hypertension  History of gastroesophageal reflux (GERD)  Mitral valve disorder  Hyperlipidemia associated with type 2 diabetes mellitus (HCC)  Venous ulcer of left leg (HCC)  Orders: Orders Placed This Encounter  Procedures   CBC with Differential/Platelet   Comprehensive metabolic  panel with GFR   VITAMIN D  25 Hydroxy (Vit-D Deficiency, Fractures)   Lipid panel   Hemoglobin A1c   Meds ordered this encounter  Medications   predniSONE  (DELTASONE ) 5 MG tablet    Sig: Take 1.5 tablets by mouth daily for 2 weeks then take 1 tablet by mouth daily.    Dispense:  35 tablet    Refill:  0   Face-to-face time spent with patient  was over 45 minutes.  Follow-Up Instructions: Return in about 3 months (around 09/12/2024).   Maya Nash, MD  Note - This record has been created using Animal nutritionist.  Chart creation errors have been sought, but may not always  have been located. Such creation errors do not reflect on  the standard of medical care.

## 2024-05-30 ENCOUNTER — Other Ambulatory Visit: Payer: Self-pay

## 2024-05-30 ENCOUNTER — Telehealth: Payer: Self-pay

## 2024-05-30 NOTE — Progress Notes (Signed)
 Specialty Pharmacy Refill Coordination Note  Tina Berger is a 73 y.o. female contacted today regarding refills of specialty medication(s) Tocilizumab -aazg (Tyenne )   Patient requested Delivery   Delivery date: 06/05/24   Verified address: [address]   [place] [address]   Medication will be filled on 06/04/24.

## 2024-05-30 NOTE — Telephone Encounter (Signed)
 Patient is due for DM follow up. Ok to call and set up? Has several open gaps.

## 2024-05-30 NOTE — Telephone Encounter (Signed)
 Yes, please do. Thanks.

## 2024-05-31 ENCOUNTER — Other Ambulatory Visit: Payer: Self-pay | Admitting: Family Medicine

## 2024-05-31 ENCOUNTER — Encounter: Payer: Self-pay | Admitting: Family Medicine

## 2024-05-31 NOTE — Telephone Encounter (Signed)
 Lvmtcb, sent mychart message & mailed letter.

## 2024-06-04 ENCOUNTER — Other Ambulatory Visit: Payer: Self-pay

## 2024-06-06 ENCOUNTER — Other Ambulatory Visit: Payer: Self-pay | Admitting: Physician Assistant

## 2024-06-06 DIAGNOSIS — Z79899 Other long term (current) drug therapy: Secondary | ICD-10-CM

## 2024-06-06 DIAGNOSIS — M0609 Rheumatoid arthritis without rheumatoid factor, multiple sites: Secondary | ICD-10-CM

## 2024-06-06 NOTE — Telephone Encounter (Signed)
 Last Fill: 04/09/2024  Next Visit: 06/12/2024  Last Visit: 02/22/2024  Dx: Rheumatoid arthritis of multiple sites with negative rheumatoid factor   Current Dose per office note on 03/13/2024: prednisone  10 mg daily.   Okay to refill Prednisone ?   Contacted patient to see if she had started tapering down by 1mg  yet, patient stated she has not reached that point yet, so still doing the 10mg  daily.

## 2024-06-11 NOTE — Addendum Note (Signed)
 Addended by: RAYLEEN GLENDORA HERO on: 06/11/2024 09:07 AM   Modules accepted: Orders

## 2024-06-12 ENCOUNTER — Ambulatory Visit: Payer: Medicare Other | Attending: Rheumatology | Admitting: Rheumatology

## 2024-06-12 ENCOUNTER — Encounter: Payer: Self-pay | Admitting: Rheumatology

## 2024-06-12 VITALS — BP 135/83 | HR 75 | Resp 14 | Ht 60.0 in | Wt 180.0 lb

## 2024-06-12 DIAGNOSIS — Z8781 Personal history of (healed) traumatic fracture: Secondary | ICD-10-CM

## 2024-06-12 DIAGNOSIS — Z79899 Other long term (current) drug therapy: Secondary | ICD-10-CM

## 2024-06-12 DIAGNOSIS — M0609 Rheumatoid arthritis without rheumatoid factor, multiple sites: Secondary | ICD-10-CM | POA: Diagnosis not present

## 2024-06-12 DIAGNOSIS — F418 Other specified anxiety disorders: Secondary | ICD-10-CM

## 2024-06-12 DIAGNOSIS — M5030 Other cervical disc degeneration, unspecified cervical region: Secondary | ICD-10-CM | POA: Diagnosis not present

## 2024-06-12 DIAGNOSIS — M47816 Spondylosis without myelopathy or radiculopathy, lumbar region: Secondary | ICD-10-CM | POA: Diagnosis not present

## 2024-06-12 DIAGNOSIS — J849 Interstitial pulmonary disease, unspecified: Secondary | ICD-10-CM | POA: Diagnosis not present

## 2024-06-12 DIAGNOSIS — I059 Rheumatic mitral valve disease, unspecified: Secondary | ICD-10-CM

## 2024-06-12 DIAGNOSIS — E785 Hyperlipidemia, unspecified: Secondary | ICD-10-CM

## 2024-06-12 DIAGNOSIS — M170 Bilateral primary osteoarthritis of knee: Secondary | ICD-10-CM | POA: Diagnosis not present

## 2024-06-12 DIAGNOSIS — N1831 Chronic kidney disease, stage 3a: Secondary | ICD-10-CM

## 2024-06-12 DIAGNOSIS — M797 Fibromyalgia: Secondary | ICD-10-CM | POA: Diagnosis not present

## 2024-06-12 DIAGNOSIS — I83029 Varicose veins of left lower extremity with ulcer of unspecified site: Secondary | ICD-10-CM

## 2024-06-12 DIAGNOSIS — E1169 Type 2 diabetes mellitus with other specified complication: Secondary | ICD-10-CM

## 2024-06-12 DIAGNOSIS — G894 Chronic pain syndrome: Secondary | ICD-10-CM

## 2024-06-12 DIAGNOSIS — Z8639 Personal history of other endocrine, nutritional and metabolic disease: Secondary | ICD-10-CM

## 2024-06-12 DIAGNOSIS — E559 Vitamin D deficiency, unspecified: Secondary | ICD-10-CM | POA: Diagnosis not present

## 2024-06-12 DIAGNOSIS — E119 Type 2 diabetes mellitus without complications: Secondary | ICD-10-CM

## 2024-06-12 DIAGNOSIS — G8929 Other chronic pain: Secondary | ICD-10-CM

## 2024-06-12 DIAGNOSIS — Z8774 Personal history of (corrected) congenital malformations of heart and circulatory system: Secondary | ICD-10-CM

## 2024-06-12 DIAGNOSIS — L97929 Non-pressure chronic ulcer of unspecified part of left lower leg with unspecified severity: Secondary | ICD-10-CM

## 2024-06-12 DIAGNOSIS — Z79891 Long term (current) use of opiate analgesic: Secondary | ICD-10-CM

## 2024-06-12 DIAGNOSIS — M25512 Pain in left shoulder: Secondary | ICD-10-CM

## 2024-06-12 DIAGNOSIS — M5134 Other intervertebral disc degeneration, thoracic region: Secondary | ICD-10-CM | POA: Diagnosis not present

## 2024-06-12 DIAGNOSIS — Z7952 Long term (current) use of systemic steroids: Secondary | ICD-10-CM | POA: Diagnosis not present

## 2024-06-12 DIAGNOSIS — Z981 Arthrodesis status: Secondary | ICD-10-CM

## 2024-06-12 DIAGNOSIS — I10 Essential (primary) hypertension: Secondary | ICD-10-CM

## 2024-06-12 DIAGNOSIS — M8589 Other specified disorders of bone density and structure, multiple sites: Secondary | ICD-10-CM

## 2024-06-12 DIAGNOSIS — I7300 Raynaud's syndrome without gangrene: Secondary | ICD-10-CM

## 2024-06-12 DIAGNOSIS — Z8719 Personal history of other diseases of the digestive system: Secondary | ICD-10-CM

## 2024-06-12 MED ORDER — PREDNISONE 5 MG PO TABS: ORAL_TABLET | ORAL | 0 refills | Status: DC

## 2024-06-12 NOTE — Patient Instructions (Addendum)
 Standing Labs We placed an order today for your standing lab work.   Please have your standing labs drawn in November and every 3 months  Please have your labs drawn 2 weeks prior to your appointment so that the provider can discuss your lab results at your appointment, if possible.  Please note that you may see your imaging and lab results in MyChart before we have reviewed them. We will contact you once all results are reviewed. Please allow our office up to 72 hours to thoroughly review all of the results before contacting the office for clarification of your results.  WALK-IN LAB HOURS  Monday through Thursday from 8:00 am -12:30 pm and 1:00 pm-4:30 pm and Friday from 8:00 am-12:00 pm.  Patients with office visits requiring labs will be seen before walk-in labs.  You may encounter longer than normal wait times. Please allow additional time. Wait times may be shorter on  Monday and Thursday afternoons.  We do not book appointments for walk-in labs. We appreciate your patience and understanding with our staff.   Labs are drawn by [facility]. Please bring your co-pay at the time of your lab draw.  You may receive a bill from [facility] for your lab work.  Please note if you are on Hydroxychloroquine  and and an order has been placed for a Hydroxychloroquine  level,  you will need to have it drawn 4 hours or more after your last dose.  If you wish to have your labs drawn at another location, please call the office 24 hours in advance so we can fax the orders.  The office is located at [address], [place], [place] [address]   If you have any questions regarding directions or hours of operation,  please call [phone].   As a reminder, please drink plenty of water prior to coming for your lab work. Thanks!   Vaccines You are taking a medication(s) that can suppress your immune system.  The following immunizations are recommended: Flu annually Covid-19  Td/Tdap (tetanus,  diphtheria, pertussis) every 10 years Pneumonia (Prevnar 15 then Pneumovax 23 at least 1 year apart.  Alternatively, can take Prevnar 20 without needing additional dose) Shingrix: 2 doses from 4 weeks to 6 months apart  Please check with your PCP to make sure you are up to date.   If you have signs or symptoms of an infection or start antibiotics: First, call your PCP for workup of your infection. Hold your medication through the infection, until you complete your antibiotics, and until symptoms resolve if you take the following: Injectable medication (Actemra , Benlysta, Cimzia, Cosentyx, Enbrel, Humira, Kevzara, Orencia, Remicade, Simponi, Stelara, Taltz, Tremfya) Methotrexate Leflunomide  (Arava ) Mycophenolate (Cellcept) Earma, Olumiant, or Rinvoq  Decrease the dose of prednisone  to 7.5 mg p.o. daily after 1 week and then to 5 mg p.o. daily when tolerated.

## 2024-06-13 ENCOUNTER — Ambulatory Visit: Payer: Self-pay | Admitting: Rheumatology

## 2024-06-13 LAB — HEMOGLOBIN A1C
Hgb A1c MFr Bld: 6.5 % — ABNORMAL HIGH (ref <5.7)
Mean Plasma Glucose: 140 mg/dL
eAG (mmol/L): 7.7 mmol/L

## 2024-06-13 LAB — COMPREHENSIVE METABOLIC PANEL WITH GFR
AG Ratio: 1.6 (calc) (ref 1.0–2.5)
ALT: 16 U/L (ref 6–29)
AST: 12 U/L (ref 10–35)
Albumin: 4.5 g/dL (ref 3.6–5.1)
Alkaline phosphatase (APISO): 52 U/L (ref 37–153)
BUN/Creatinine Ratio: 37 (calc) — ABNORMAL HIGH (ref 6–22)
BUN: 31 mg/dL — ABNORMAL HIGH (ref 7–25)
CO2: 31 mmol/L (ref 20–32)
Calcium: 9.5 mg/dL (ref 8.6–10.4)
Chloride: 98 mmol/L (ref 98–110)
Creat: 0.83 mg/dL (ref 0.60–1.00)
Globulin: 2.9 g/dL (ref 1.9–3.7)
Glucose, Bld: 97 mg/dL (ref 65–99)
Potassium: 5.5 mmol/L — ABNORMAL HIGH (ref 3.5–5.3)
Sodium: 138 mmol/L (ref 135–146)
Total Bilirubin: 0.4 mg/dL (ref 0.2–1.2)
Total Protein: 7.4 g/dL (ref 6.1–8.1)
eGFR: 74 mL/min/1.73m2 (ref >=60)

## 2024-06-13 LAB — CBC WITH DIFFERENTIAL/PLATELET
Absolute Lymphocytes: 1960 {cells}/uL (ref 850–3900)
Absolute Monocytes: 782 {cells}/uL (ref 200–950)
Basophils Absolute: 28 {cells}/uL (ref 0–200)
Basophils Relative: 0.3 %
Eosinophils Absolute: 83 {cells}/uL (ref 15–500)
Eosinophils Relative: 0.9 %
HCT: 42.6 % (ref 35.0–45.0)
Hemoglobin: 13.9 g/dL (ref 11.7–15.5)
MCH: 29 pg (ref 27.0–33.0)
MCHC: 32.6 g/dL (ref 32.0–36.0)
MCV: 88.8 fL (ref 80.0–100.0)
MPV: 9.5 fL (ref 7.5–12.5)
Monocytes Relative: 8.5 %
Neutro Abs: 6348 {cells}/uL (ref 1500–7800)
Neutrophils Relative %: 69 %
Platelets: 315 Thousand/uL (ref 140–400)
RBC: 4.8 Million/uL (ref 3.80–5.10)
RDW: 14 % (ref 11.0–15.0)
Total Lymphocyte: 21.3 %
WBC: 9.2 Thousand/uL (ref 3.8–10.8)

## 2024-06-13 LAB — LIPID PANEL
Cholesterol: 218 mg/dL — ABNORMAL HIGH (ref <200)
HDL: 82 mg/dL (ref >=50)
LDL Cholesterol (Calc): 105 mg/dL — ABNORMAL HIGH
Non-HDL Cholesterol (Calc): 136 mg/dL — ABNORMAL HIGH (ref <130)
Total CHOL/HDL Ratio: 2.7 (calc) (ref <5.0)
Triglycerides: 185 mg/dL — ABNORMAL HIGH (ref <150)

## 2024-06-13 LAB — VITAMIN D 25 HYDROXY (VIT D DEFICIENCY, FRACTURES): Vit D, 25-Hydroxy: 37 ng/mL (ref 30–100)

## 2024-06-13 NOTE — Progress Notes (Signed)
 CBC normal, CMP shows elevated potassium at 5.5, LDL is elevated and triglycerides are elevated.  Hemoglobin A1c is high at 6.5 due to prednisone  use.  Vitamin D  is normal is 37.  Please notify patient.  She should discuss elevated glucose level with her PCP.  Please forward results to her PCP.

## 2024-06-13 NOTE — Telephone Encounter (Addendum)
 Spoke with pt offering to r/s DM f/u with her PCP, Dr Randeen. Pt agreed and OV r/s on 06/15/24 at 11:00 with Dr Randeen.

## 2024-06-14 DIAGNOSIS — Z79891 Long term (current) use of opiate analgesic: Secondary | ICD-10-CM | POA: Diagnosis not present

## 2024-06-14 DIAGNOSIS — Z79899 Other long term (current) drug therapy: Secondary | ICD-10-CM | POA: Diagnosis not present

## 2024-06-14 DIAGNOSIS — M961 Postlaminectomy syndrome, not elsewhere classified: Secondary | ICD-10-CM | POA: Diagnosis not present

## 2024-06-14 DIAGNOSIS — M4725 Other spondylosis with radiculopathy, thoracolumbar region: Secondary | ICD-10-CM | POA: Diagnosis not present

## 2024-06-14 DIAGNOSIS — K5903 Drug induced constipation: Secondary | ICD-10-CM | POA: Diagnosis not present

## 2024-06-14 DIAGNOSIS — M5451 Vertebrogenic low back pain: Secondary | ICD-10-CM | POA: Diagnosis not present

## 2024-06-14 DIAGNOSIS — G894 Chronic pain syndrome: Secondary | ICD-10-CM | POA: Diagnosis not present

## 2024-06-14 DIAGNOSIS — M5134 Other intervertebral disc degeneration, thoracic region: Secondary | ICD-10-CM | POA: Diagnosis not present

## 2024-06-15 ENCOUNTER — Ambulatory Visit: Admitting: Family Medicine

## 2024-06-15 ENCOUNTER — Encounter: Payer: Self-pay | Admitting: Family Medicine

## 2024-06-15 VITALS — BP 116/68 | HR 80 | Temp 98.7°F | Ht 60.0 in | Wt 179.0 lb

## 2024-06-15 DIAGNOSIS — I10 Essential (primary) hypertension: Secondary | ICD-10-CM

## 2024-06-15 DIAGNOSIS — E875 Hyperkalemia: Secondary | ICD-10-CM | POA: Diagnosis not present

## 2024-06-15 DIAGNOSIS — Z7984 Long term (current) use of oral hypoglycemic drugs: Secondary | ICD-10-CM

## 2024-06-15 DIAGNOSIS — E119 Type 2 diabetes mellitus without complications: Secondary | ICD-10-CM | POA: Diagnosis not present

## 2024-06-15 DIAGNOSIS — M0609 Rheumatoid arthritis without rheumatoid factor, multiple sites: Secondary | ICD-10-CM | POA: Diagnosis not present

## 2024-06-15 LAB — BASIC METABOLIC PANEL WITH GFR
BUN: 35 mg/dL — ABNORMAL HIGH (ref 6–23)
CO2: 32 meq/L (ref 19–32)
Calcium: 9.2 mg/dL (ref 8.4–10.5)
Chloride: 97 meq/L (ref 96–112)
Creatinine, Ser: 1.06 mg/dL (ref 0.40–1.20)
GFR: 51.81 mL/min — ABNORMAL LOW (ref >60.00)
Glucose, Bld: 136 mg/dL — ABNORMAL HIGH (ref 70–99)
Potassium: 5.1 meq/L (ref 3.5–5.1)
Sodium: 140 meq/L (ref 135–145)

## 2024-06-15 LAB — MICROALBUMIN / CREATININE URINE RATIO
Creatinine,U: 133.6 mg/dL
Microalb Creat Ratio: 28 mg/g (ref 0.0–30.0)
Microalb, Ur: 3.7 mg/dL — ABNORMAL HIGH (ref 0.0–1.9)

## 2024-06-15 NOTE — Assessment & Plan Note (Signed)
 Now off ace  Unsure of cause No supplements with K No renal insuff   Will repeat today  If still high will instruct to hold topical nsaid  ? Rare side effects of beta blocker?- doubtful   Instructed to call if any cp/palpitations Call back and Er precautions noted in detail today

## 2024-06-15 NOTE — Assessment & Plan Note (Signed)
 Improved Lab Results  Component Value Date   HGBA1C 6.5 (H) 06/12/2024  Monitored by rheumatology as this is prednisone  related   Taking metformin  XR 1000 mg bid and tolerating it   Microalb ordered  On statin  Eating less junk , commended  Exercise is a challenge No ace/arb due to high K

## 2024-06-15 NOTE — Assessment & Plan Note (Signed)
 Continues low dose prednisone  (causing dm) along with toclizumab   Reviewed rheumatology notes and labs

## 2024-06-15 NOTE — Assessment & Plan Note (Signed)
  BP Readings from Last 1 Encounters:  06/15/24 116/68  Continues metoprolol  xl 50 mg bid  Amlodipine  6 mg daily  Most recent labs reviewed  Disc lifstyle change with low sodium diet and exercise   Lab today / unsure what is causing elevated K  Beta blocker unlikely

## 2024-06-15 NOTE — Patient Instructions (Addendum)
 Labs for potassium again today If it remains high then we will have you hold your topical diclofenac    Take care of yourself   Also urine protein test   For cholesterol Avoid red meat/ fried foods/ egg yolks/ fatty breakfast meats/ butter, cheese and high fat dairy/ and shellfish

## 2024-06-15 NOTE — Progress Notes (Signed)
 Subjective:    Patient ID: Tina Berger, female    DOB: [birth year], 73 y.o.   MRN: [id number]  HPI  Wt Readings from Last 3 Encounters:  06/15/24 179 lb (81.2 kg)  06/12/24 180 lb (81.6 kg)  04/09/24 176 lb (79.8 kg)   34.96 kg/m  Vitals:   06/15/24 1051  BP: 116/68  Pulse: 80  Temp: 98.7 F (37.1 C)  SpO2: 94%    Pt presents for follow up of chronic medical problems Hyperkalemia  DM2 HTN  Overall tired overall  Otherwise feels about the same   Had to change her RA med  Had to change generic tocilizumab   Wants to get off the prednisone   Tried to wean once and her hands hurt a lot  Tried Ariva -it made her cry all day    No supplements with potassium   HTN bp is stable today  No cp or palpitations or headaches or edema  No side effects to medicines  BP Readings from Last 3 Encounters:  06/15/24 116/68  06/12/24 135/83  04/09/24 124/76     Lab Results  Component Value Date   NA 138 06/12/2024   K 5.5 (H) 06/12/2024   CO2 31 06/12/2024   GLUCOSE 97 06/12/2024   BUN 31 (H) 06/12/2024   CREATININE 0.83 06/12/2024   CALCIUM  9.5 06/12/2024   GFR 66.95 09/05/2023   EGFR 74 06/12/2024   GFRNONAA >60 12/26/2022   Metoprolol  xl 50 mg bid  Amlodipine  5 mg daily  Ace caused hyperkalemia in past   Uses topical diflofenac solution  DM2 Lab Results  Component Value Date   HGBA1C 6.5 (H) 06/12/2024   HGBA1C 6.2 09/05/2023   HGBA1C 7.1 (H) 07/06/2022   Has had prednisone   Metformin  xr  1000 mg bid   Eating more bread   Lab Results  Component Value Date   LABMICR 14.3 07/06/2022   LABMICR See below: 09/29/2021     Hypothyroid Lab Results  Component Value Date   TSH 0.92 03/27/2024      Lab Results  Component Value Date   WBC 9.2 06/12/2024   HGB 13.9 06/12/2024   HCT 42.6 06/12/2024   MCV 88.8 06/12/2024   PLT 315 06/12/2024   Hyperlipidemia Lab Results  Component Value Date   CHOL 218 (H) 06/12/2024   CHOL 162 09/05/2023    CHOL 181 07/06/2022   Lab Results  Component Value Date   HDL 82 06/12/2024   HDL 51.40 09/05/2023   HDL 48 07/06/2022   Lab Results  Component Value Date   LDLCALC 105 (H) 06/12/2024   LDLCALC 82 09/05/2023   LDLCALC 113 (H) 07/06/2022   Lab Results  Component Value Date   TRIG 185 (H) 06/12/2024   TRIG 141.0 09/05/2023   TRIG 108 07/06/2022   Lab Results  Component Value Date   CHOLHDL 2.7 06/12/2024   CHOLHDL 3 09/05/2023   CHOLHDL 3.8 07/06/2022   Lab Results  Component Value Date   LDLDIRECT 149.0 07/26/2016   LDLDIRECT 136.0 08/25/2015   LDLDIRECT 135.6 10/08/2011   Crestor  5 mg twice weekly  Husband was responsible for food  Pt ate worse  Eating beef    Patient Active Problem List   Diagnosis Date Noted   Long term current use of oral hypoglycemic drug 06/15/2024   Hyperkalemia 03/27/2024   Aortic atherosclerosis (HCC) 09/12/2023   Skin lesion 09/12/2023   Frequent falls 12/29/2022   Dysuria 09/07/2021   Rheumatoid arthritis (  HCC) 05/27/2021   Chronic venous insufficiency 05/23/2021   AVM (arteriovenous malformation) 04/04/2021   CAD (coronary artery disease) 01/22/2021   Primary osteoarthritis of both knees 01/12/2021   Positive ANA (antinuclear antibody) 11/17/2020   Joint pain 11/13/2020   Current use of proton pump inhibitor 08/26/2020   Estrogen deficiency 08/21/2018   TMJ (dislocation of temporomandibular joint), initial encounter 05/05/2018   Anterior neck pain 05/05/2018   Chronic pain syndrome 11/09/2017   Chronic upper extremity pain [facility]) (Bilateral) (L>R) 11/09/2017   Fibromyalgia syndrome 11/09/2017   Osteoarthritis 11/09/2017   Osteoarthritis of lumbar facet joint (Bilateral) 11/09/2017   Lumbar facet arthropathy (Bilateral) 11/09/2017   Lumbar facet syndrome (Bilateral) (L>R) 11/09/2017   Lumbar foraminal stenosis (multilevel) (Bilateral) 11/09/2017   DDD (degenerative disc disease), lumbar 11/09/2017   Thoracic  levoscoliosis 11/09/2017   Thoracic facet syndrome (Bilateral) (L>R) 11/09/2017   Thoracic facet arthropathy (Bilateral) (R>L) 11/09/2017   DDD (degenerative disc disease), thoracic 11/09/2017   Thoracolumbar IVDD 11/09/2017   DDD (degenerative disc disease), cervical 11/09/2017   Osteoarthritis of cervical facet (Bilateral) 11/09/2017   Grade 1 Anterolisthesis of C7 over T1 11/09/2017   Cervical foraminal stenosis (Bilateral) 11/09/2017   History of fusion of cervical spine (C5-6 ACDF) 11/09/2017   Cervical facet syndrome (Bilateral) 11/09/2017   Disorder of skeletal system 11/09/2017   Cervical radiculitis (Bilateral) 11/09/2017   Lumbar Epidural lipomatosis 11/09/2017   Chronic sacroiliac joint pain (Bilateral) (L>R) 11/09/2017   Chronic hip pain (Bilateral) (L>R) 11/09/2017   Chronic upper back pain (Primary Area of Pain) (midline) 10/24/2017   Chronic neck pain (Secondary Area of Pain) (Bilateral)  (L>R) 10/24/2017   Chronic low back pain (Fourth Area of Pain) (Bilateral) (L>R) 10/24/2017   Chronic lower extremity pain (Fifth Area of Pain) (Bilateral) (L>R) 10/24/2017   Lumbar Grade 1 Retrolisthesis of L1-2 and L2-3 10/24/2017   Other long term (current) drug therapy 10/24/2017   Other specified health status 10/24/2017   Long term current use of opiate analgesic 10/24/2017   Long term prescription opiate use 10/24/2017   Opiate use 10/24/2017   DM type 2 (diabetes mellitus, type 2) (HCC) 06/14/2014   Pharmacologic therapy 06/14/2014   Pedal edema    Post-menopausal 06/23/2012   Lumbar disc disease with radiculopathy 11/18/2011   HTN (hypertension) 07/30/2011   Raynaud disease 07/30/2011   Obesity 07/30/2011   Routine general medical examination at a health care facility 03/28/2011   Palpitations 04/27/2010   Depression with anxiety 06/14/2008   Vitamin D  deficiency 05/13/2008   Osteopenia 03/25/2008   Hypothyroidism 03/28/2007   Hyperlipidemia associated with type 2  diabetes mellitus (HCC) 03/28/2007   Other iron deficiency anemias 03/28/2007   PANIC ATTACK 03/28/2007   KERATOCONJUNCTIVITIS SICCA 03/28/2007   Mitral valve disorder 03/28/2007   GERD 03/28/2007   IBS 03/28/2007   Rosacea 03/28/2007   PLANTAR FASCIITIS 03/28/2007   MIGRAINES, HX OF 03/28/2007   Past Medical History:  Diagnosis Date   Acute renal insufficiency 05/31/2014   pt not aware of this diagnosis   Anemia, iron deficiency    Anxiety    Bilateral lower extremity edema    a. uses torsemide    Broken wrist    did not require surgery   Cervical dysplasia    abnormal paps   Colon cancer screening 06/14/2014   Coronary artery disease    Cough 08/08/2012   Degenerative disk disease 11/19/2011   Depression    Diabetes mellitus type II  Diverticulosis    DJD (degenerative joint disease)    Drug rash 05/22/2011   Dry eyes    Dysrhythmia    metoprolol .   Edema    Elevated liver enzymes 03/21/2012   Encounter for routine gynecological examination 06/14/2014   ESOPHAGITIS 03/28/2007   Qualifier: Hospitalized for  By: Cecilie CMA, NANNIE Ivy     Fibromyalgia    GASTRITIS 03/28/2007   Qualifier: History of  By: Cecilie CMA, NANNIE Ivy     GERD (gastroesophageal reflux disease)    Hemorrhoids    external   HLD (hyperlipidemia)    HNP (herniated nucleus pulposus) 11/1997   T6,7,8 with DJD   HTN (hypertension)    Hyperglycemia 05/13/2008   Qualifier: Diagnosis of  By: Randeen MD, Laine Caldron    Hypothyroidism    Interstitial lung disease (HCC)    pt not aware of this   Left ovarian cyst    x 3, rupture   Osteoarthritis    hands   Osteopenia    mild-11/01; improved 12/05   Other screening mammogram 08/18/2011   Palpitations    PERSONAL HISTORY ALLERGY UNSPEC MEDICINAL AGENT 03/18/2010   Qualifier: Diagnosis of  By: Randeen MD, Laine Caldron    PONV (postoperative nausea and vomiting)    Raynaud's disease    Recurrent HSV (herpes simplex virus)    lesions in nose or  mouth with frequent ulcers. d/t meds and dry mouth   Rheumatoid arthritis (HCC)    Rhinitis 03/21/2012   Routine general medical examination at a health care facility 03/28/2011   Past Surgical History:  Procedure Laterality Date   ABDOMINAL EXPLORATION SURGERY     ACDF  1991   BIOPSY OF SKIN SUBCUTANEOUS TISSUE AND/OR MUCOUS MEMBRANE Left 04/08/2021   Procedure: BIOPSY OF SKIN SUBCUTANEOUS TISSUE AND/OR MUCOUS MEMBRANE ( REMOVAL SKIN NODULE);  Surgeon: Jama Cordella MATSU, MD;  Location: [facility];  Service: Vascular;  Laterality: Left;   CATARACT EXTRACTION W/PHACO Right 02/07/2024   Procedure: PHACOEMULSIFICATION, CATARACT, WITH IOL INSERTION 8.20 00:48.7;  Surgeon: Jaye Fallow, MD;  Location: [facility];  Service: Ophthalmology;  Laterality: Right;   CATARACT EXTRACTION W/PHACO Left 02/21/2024   Procedure: PHACOEMULSIFICATION, CATARACT, WITH IOL INSERTION 5.42 00:40.0;  Surgeon: Jaye Fallow, MD;  Location: [facility];  Service: Ophthalmology;  Laterality: Left;   COLONOSCOPY  08/2000   Diverticulosis; hemorrhoids   HAND SURGERY Left    left thumb. PIN REMOVED   LASIK     bilateral   Social History   Tobacco Use   Smoking status: Former    Current packs/day: 0.00    Average packs/day: 1 pack/day for 5.0 years (5.0 ttl pk-yrs)    Types: Cigarettes    Start date: 10/19/1963    Quit date: 10/18/1968    Years since quitting: 55.6    Passive exposure: Past   Smokeless tobacco: Never   Tobacco comments:    quit over 40 years  Vaping Use   Vaping status: Never Used  Substance Use Topics   Alcohol use: No    Alcohol/week: 0.0 standard drinks of alcohol   Drug use: No   Family History  Problem Relation Age of Onset   Lung cancer Mother        + smoker   Coronary artery disease Mother        relatively young   Emphysema Father        + smoker   Lymphoma Sister    Heart disease Brother  Lymphoma Brother    Parkinson's disease Brother    Anemia  Brother        aplastic    Diabetes Brother    Iron deficiency Daughter    Leukemia Son    Breast cancer Neg Hx    Allergies  Allergen Reactions   Ceftin  [Cefuroxime ] Swelling    Swelling, legs turn blue.  Legs swelling, then blue, then a rash   Clonidine  Derivatives     Swelling of lips   Erythromycin Swelling    Rash, swollen gums    Keflex  [Cephalexin ] Anaphylaxis    Chest was broken out in a rash. Lips were swelling. Took a few days to occur, but it kept getting worse.                                                                                                                                                                                                   Macrobid  [Nitrofurantoin ]     REACTION: ? Lung problem    Penicillins Anaphylaxis    Pt had a daughter with an allergy to this at a young age. Patient developed blisters anywhere her child touched her. Also, if she used the bathroom after daughter did while on keflex , it would cause a reaction for her. Lips and roof of mouth swelled up also.   Sulfonamide Derivatives Swelling    Rash, swollen gums, lips   Ciprofloxacin     REACTION: ? rash vs sun rxn. Rash    Fluoxetine Hcl     REACTION: stomach problems. Severe pain in abdomen. Could not function d/t pain   Furosemide Swelling    REACTION: swelling worsened in legs once taking   Gabapentin Other (See Comments)    REACTION: edema of feet. Unable to get shoes on   Hydroxychloroquine  Itching    Pt report itching felt like pre-anaphylaxis she has had with other medications   Paroxetine     REACTION: weight gain (30 pound weight gain   Pregabalin     REACTION: swelling of feet and legs. Unable to get shoes on.   Tetracycline     REACTION:inflammed genitals.    Venlafaxine     REACTION: sweating profusely   Etodolac     REACTION: reaction not known   Ramipril      Hyperkalemia    Amitriptyline Hcl     REACTION: sedating   Atorvastatin     REACTION: muscle  twitch and pain   Benicar  [Olmesartan  Medoxomil]     Muscle pain    Cephalexin  Hives and Rash   Cetirizine Hcl  REACTION: headache   Diovan [Valsartan]     Thought it made her feel confused   Naproxen Sodium Other (See Comments)    REACTION: edema of feet and legs.  Not good for ckd   Current Outpatient Medications on File Prior to Visit  Medication Sig Dispense Refill   Acetaminophen  (TYLENOL  ARTHRITIS PAIN PO) Take 650 mg by mouth daily.     amLODipine  (NORVASC ) 5 MG tablet Take 1 tablet (5 mg total) by mouth daily. 90 tablet 0   buPROPion  (WELLBUTRIN  XL) 150 MG 24 hr tablet TAKE 1 TABLET BY MOUTH DAILY 90 tablet 2   Chlorphen-Phenyleph-ASA (ALKA-SELTZER PLUS COLD PO) Take by mouth as needed. Just for colds     cholecalciferol (VITAMIN D3) 25 MCG (1000 UNIT) tablet Take 1,000 Units by mouth daily.     cyclobenzaprine  (FLEXERIL ) 5 MG tablet Take 5 mg by mouth daily as needed.     Diclofenac  Sodium 1.5 % SOLN Place 2 mLs onto the skin 4 (four) times daily. 3 Bottle 3   DULoxetine  (CYMBALTA ) 60 MG capsule TAKE 1 CAPSULE BY MOUTH EVERY DAY 90 capsule 3   esomeprazole  (NEXIUM ) 20 MG capsule TAKE 1 CAPSULE BY MOUTH DAILY 90 capsule 2   famotidine  (PEPCID ) 40 MG tablet TAKE 1 TABLET BY MOUTH DAILY 90 tablet 2   ferrous sulfate 325 (65 FE) MG EC tablet Take 325 mg by mouth daily with breakfast.     glucose blood test strip One Touch Ultra stripts blue-To check sugar once daily and as needed for DM2 250.00 100 each 3   GUAIFENESIN CR PO Take by mouth as needed.     hyoscyamine  (LEVSIN  SL) 0.125 MG SL tablet Take 1 tablet (0.125 mg total) by mouth 2 (two) times daily as needed.     levothyroxine  (SYNTHROID ) 125 MCG tablet TAKE 1 TABLET BY MOUTH DAILY  BEFORE BREAKFAST 90 tablet 2   LYSINE PO Take by mouth as needed. OTC for ulcer prophylaxis     Magnesium  400 MG CAPS Take by mouth daily.     metFORMIN  (GLUCOPHAGE -XR) 500 MG 24 hr tablet TAKE 2 TABLETS BY MOUTH TWICE  DAILY WITH A MEAL 360  tablet 2   metoprolol  succinate (TOPROL -XL) 50 MG 24 hr tablet TAKE 1 TABLET BY MOUTH TWICE  DAILY 180 tablet 2   MIEBO 1.338 GM/ML SOLN      naloxone (NARCAN) nasal spray 4 mg/0.1 mL      ondansetron  (ZOFRAN ) 4 MG tablet Take 1 tablet (4 mg total) by mouth every 8 (eight) hours as needed for nausea or vomiting. 20 tablet 0   oxyCODONE  (OXYCONTIN ) 10 MG 12 hr tablet Take 10 mg by mouth 3 (three) times daily as needed. Beakthrough pain     prednisoLONE acetate (PRED FORTE) 1 % ophthalmic suspension INSTILL 1 DROP INTO RIGHT EYE FOUR TIMES A DAY TAPER AS INSTRUCTED     predniSONE  (DELTASONE ) 10 MG tablet TAKE 1 TABLET (10 MG TOTAL) BY MOUTH DAILY WITH BREAKFAST. 30 tablet 0   predniSONE  (DELTASONE ) 5 MG tablet Take 1.5 tablets by mouth daily for 2 weeks then take 1 tablet by mouth daily. 35 tablet 0   rosuvastatin  (CRESTOR ) 5 MG tablet TAKE 1 TABLET BY MOUTH TWICE WEEKLY. NEEDS OFFICE VISIT 29 tablet 0   Tocilizumab -aazg (TYENNE ) 162 MG/0.9ML SOAJ Inject 162 mg into the skin every 14 (fourteen) days. 5.4 mL 0   vitamin B-12 (CYANOCOBALAMIN ) 500 MCG tablet Take 500 mcg by mouth daily.  XTAMPZA  ER 36 MG C12A Take 1 capsule by mouth every 12 (twelve) hours.     Xylitol (XYLIMELTS MT) Use as directed in the mouth or throat.     Current Facility-Administered Medications on File Prior to Visit  Medication Dose Route Frequency Provider Last Rate Last Admin   albuterol  (PROVENTIL ) (2.5 MG/3ML) 0.083% nebulizer solution 2.5 mg  2.5 mg Nebulization Once Ramaswamy, Murali, MD        Review of Systems  Constitutional:  Positive for fatigue. Negative for activity change, appetite change, fever and unexpected weight change.  HENT:  Negative for congestion, ear pain, rhinorrhea, sinus pressure and sore throat.   Eyes:  Negative for pain, redness and visual disturbance.  Respiratory:  Negative for cough, shortness of breath and wheezing.   Cardiovascular:  Negative for chest pain and palpitations.   Gastrointestinal:  Negative for abdominal pain, blood in stool, constipation and diarrhea.  Endocrine: Negative for polydipsia and polyuria.  Genitourinary:  Negative for dysuria, frequency and urgency.  Musculoskeletal:  Positive for arthralgias and myalgias. Negative for back pain.  Skin:  Negative for pallor and rash.  Allergic/Immunologic: Negative for environmental allergies.  Neurological:  Negative for dizziness, syncope and headaches.  Hematological:  Negative for adenopathy. Does not bruise/bleed easily.  Psychiatric/Behavioral:  Negative for decreased concentration and dysphoric mood. The patient is not nervous/anxious.        Objective:   Physical Exam Constitutional:      General: She is not in acute distress.    Appearance: Normal appearance. She is well-developed. She is obese. She is not ill-appearing or diaphoretic.  HENT:     Head: Normocephalic and atraumatic.  Eyes:     Conjunctiva/sclera: Conjunctivae normal.     Pupils: Pupils are equal, round, and reactive to light.  Neck:     Thyroid : No thyromegaly.     Vascular: No carotid bruit or JVD.  Cardiovascular:     Rate and Rhythm: Normal rate and regular rhythm.     Heart sounds: Normal heart sounds.     No gallop.     Comments: Baseline rubor of feet from raynauds  Pulmonary:     Effort: Pulmonary effort is normal. No respiratory distress.     Breath sounds: Normal breath sounds. No wheezing or rales.  Abdominal:     General: There is no distension or abdominal bruit.     Palpations: Abdomen is soft.     Tenderness: There is no abdominal tenderness.  Musculoskeletal:     Cervical back: Normal range of motion and neck supple.     Right lower leg: No edema.     Left lower leg: No edema.  Lymphadenopathy:     Cervical: No cervical adenopathy.  Skin:    General: Skin is warm and dry.     Coloration: Skin is not pale.     Findings: No rash.  Neurological:     Mental Status: She is alert.     Sensory:  No sensory deficit.     Coordination: Coordination normal.     Deep Tendon Reflexes: Reflexes are normal and symmetric. Reflexes normal.  Psychiatric:        Mood and Affect: Mood normal.           Assessment & Plan:   Problem List Items Addressed This Visit       Cardiovascular and Mediastinum   HTN (hypertension)    BP Readings from Last 1 Encounters:  06/15/24 116/68  Continues metoprolol   xl 50 mg bid  Amlodipine  6 mg daily  Most recent labs reviewed  Disc lifstyle change with low sodium diet and exercise   Lab today / unsure what is causing elevated K  Beta blocker unlikely          Endocrine   DM type 2 (diabetes mellitus, type 2) (HCC) - Primary   Improved Lab Results  Component Value Date   HGBA1C 6.5 (H) 06/12/2024  Monitored by rheumatology as this is prednisone  related   Taking metformin  XR 1000 mg bid and tolerating it   Microalb ordered  On statin  Eating less junk , commended  Exercise is a challenge No ace/arb due to high K       Relevant Orders   Microalbumin / creatinine urine ratio     Musculoskeletal and Integument   Rheumatoid arthritis (HCC)   Continues low dose prednisone  (causing dm) along with toclizumab   Reviewed rheumatology notes and labs         Other   Hyperkalemia   Now off ace  Unsure of cause No supplements with K No renal insuff   Will repeat today  If still high will instruct to hold topical nsaid  ? Rare side effects of beta blocker?- doubtful   Instructed to call if any cp/palpitations Call back and Er precautions noted in detail today        Relevant Orders   Basic Metabolic Panel

## 2024-06-17 ENCOUNTER — Ambulatory Visit: Payer: Self-pay | Admitting: Family Medicine

## 2024-06-19 ENCOUNTER — Telehealth: Payer: Self-pay | Admitting: *Deleted

## 2024-06-19 NOTE — Telephone Encounter (Signed)
 Pt viewed results via mychart but per Dr. Randeen:  Annual exam is due after 09/11/24-please schedule

## 2024-06-21 NOTE — Telephone Encounter (Signed)
 See mychart message please f/u with pt and get appt scheduled

## 2024-06-22 NOTE — Telephone Encounter (Signed)
 I called and patient has been scheduled.

## 2024-06-24 ENCOUNTER — Other Ambulatory Visit: Payer: Self-pay | Admitting: Family Medicine

## 2024-06-25 ENCOUNTER — Other Ambulatory Visit: Payer: Self-pay

## 2024-06-25 DIAGNOSIS — Z9889 Other specified postprocedural states: Secondary | ICD-10-CM | POA: Diagnosis not present

## 2024-06-25 DIAGNOSIS — H02889 Meibomian gland dysfunction of unspecified eye, unspecified eyelid: Secondary | ICD-10-CM | POA: Diagnosis not present

## 2024-06-25 DIAGNOSIS — M3501 Sicca syndrome with keratoconjunctivitis: Secondary | ICD-10-CM | POA: Diagnosis not present

## 2024-06-27 ENCOUNTER — Ambulatory Visit: Admitting: Primary Care

## 2024-06-28 MED ORDER — PREDNISONE 1 MG PO TABS: 4.0000 mg | ORAL_TABLET | Freq: Every day | ORAL | 0 refills | Status: DC

## 2024-06-28 NOTE — Telephone Encounter (Signed)
 Is it okay to send a prescription for 1mg  prednisone  ?

## 2024-06-28 NOTE — Telephone Encounter (Signed)
 Please review and sign

## 2024-06-28 NOTE — Telephone Encounter (Signed)
 Please send a prescription for prednisone  1 mg tablet, 4 tablets p.o. every morning total 30-day supply.

## 2024-06-28 NOTE — Addendum Note (Signed)
 Addended by: Siarah Deleo P on: 06/28/2024 04:54 PM   Modules accepted: Orders

## 2024-07-02 ENCOUNTER — Other Ambulatory Visit: Payer: Self-pay | Admitting: Physician Assistant

## 2024-07-02 ENCOUNTER — Other Ambulatory Visit: Payer: Self-pay

## 2024-07-02 ENCOUNTER — Other Ambulatory Visit (HOSPITAL_COMMUNITY): Payer: Self-pay

## 2024-07-02 DIAGNOSIS — J849 Interstitial pulmonary disease, unspecified: Secondary | ICD-10-CM

## 2024-07-02 DIAGNOSIS — Z79899 Other long term (current) drug therapy: Secondary | ICD-10-CM

## 2024-07-02 DIAGNOSIS — M0609 Rheumatoid arthritis without rheumatoid factor, multiple sites: Secondary | ICD-10-CM

## 2024-07-02 MED ORDER — TYENNE 162 MG/0.9ML ~~LOC~~ SOAJ
162.0000 mg | SUBCUTANEOUS | 0 refills | Status: DC
  Filled 2024-07-02: qty 5.4, 84d supply, fill #0
  Filled 2024-07-04: qty 1.8, 28d supply, fill #0
  Filled 2024-08-01: qty 1.8, 28d supply, fill #1
  Filled 2024-08-28: qty 1.8, 28d supply, fill #2

## 2024-07-02 NOTE — Telephone Encounter (Signed)
 Last Fill: 04/12/2024  Labs: 06/15/2024 BMP Glucose 136, BUN 35, GFR 51.81, CBC 06/12/2024 normal  TB Gold: 03/13/2024   TB gold negative   Next Visit: 09/12/2024  Last Visit: 06/12/2024   IK:Myzlfjunpi arthritis of multiple sites with negative rheumatoid factor   Current Dose per office note 06/12/2024 : Tyenne  (tocilizumab  biosimilar) 162 mg subcutaneous injections every 14 days   Okay to refill Tyenne ?

## 2024-07-02 NOTE — Telephone Encounter (Signed)
 Last Fill: 04/12/2024  Labs: 06/12/2024 CBC normal, CMP shows elevated potassium at 5.5, LDL is elevated and triglycerides are elevated. Hemoglobin A1c is high at 6.5 due to prednisone  use. Vitamin D  is normal is 37.   TB Gold: 03/13/2024 negative    Next Visit: 09/12/2024  Last Visit: 06/12/2024  IK:Myzlfjunpi arthritis of multiple sites with negative rheumatoid factor   Current Dose per office note on 06/12/2024: Tyenne  (tocilizumab  biosimilar) 162 mg subcutaneous injections every 14 days   Okay to refill tyenne ?

## 2024-07-03 ENCOUNTER — Telehealth: Payer: Self-pay

## 2024-07-03 NOTE — Telephone Encounter (Signed)
 Per previous encounter, current PA expires on 07/26/24.  Submitted a Prior Authorization request to [facility] for TYENNE  SQ via CoverMyMeds. Will update once we receive a response.  Key: [id number]

## 2024-07-03 NOTE — Telephone Encounter (Signed)
 Received notification from [facility] regarding a prior authorization for TYENNE  SQ. Authorization has been APPROVED from 07/03/2024 to 10/17/2024. Approval letter sent to scan center.  Authorization # [id number]

## 2024-07-04 ENCOUNTER — Other Ambulatory Visit: Payer: Self-pay | Admitting: Pharmacy Technician

## 2024-07-04 ENCOUNTER — Other Ambulatory Visit: Payer: Self-pay

## 2024-07-04 ENCOUNTER — Other Ambulatory Visit (HOSPITAL_COMMUNITY): Payer: Self-pay

## 2024-07-04 NOTE — Progress Notes (Signed)
 Specialty Pharmacy Refill Coordination Note  Tina Berger is a 73 y.o. female contacted today regarding refills of specialty medication(s) Tocilizumab -aazg (Tyenne )   Patient requested Delivery   Delivery date: 07/10/24   Verified address: [address]   [place] [address]   Medication will be filled on 07/09/24. Injection due on 07/12/24. Spoke to patient's spouse.

## 2024-07-06 ENCOUNTER — Other Ambulatory Visit: Payer: Self-pay | Admitting: Physician Assistant

## 2024-07-06 DIAGNOSIS — M0609 Rheumatoid arthritis without rheumatoid factor, multiple sites: Secondary | ICD-10-CM

## 2024-07-06 DIAGNOSIS — Z79899 Other long term (current) drug therapy: Secondary | ICD-10-CM

## 2024-07-09 ENCOUNTER — Other Ambulatory Visit: Payer: Self-pay | Admitting: Family Medicine

## 2024-07-09 ENCOUNTER — Other Ambulatory Visit: Payer: Self-pay

## 2024-07-09 DIAGNOSIS — I10 Essential (primary) hypertension: Secondary | ICD-10-CM

## 2024-07-09 DIAGNOSIS — E039 Hypothyroidism, unspecified: Secondary | ICD-10-CM

## 2024-07-09 DIAGNOSIS — F418 Other specified anxiety disorders: Secondary | ICD-10-CM

## 2024-07-09 DIAGNOSIS — E119 Type 2 diabetes mellitus without complications: Secondary | ICD-10-CM

## 2024-07-09 DIAGNOSIS — K219 Gastro-esophageal reflux disease without esophagitis: Secondary | ICD-10-CM

## 2024-07-11 ENCOUNTER — Other Ambulatory Visit: Payer: Self-pay | Admitting: Rheumatology

## 2024-07-11 DIAGNOSIS — M0609 Rheumatoid arthritis without rheumatoid factor, multiple sites: Secondary | ICD-10-CM

## 2024-07-11 NOTE — Telephone Encounter (Signed)
 Last Fill: 06/12/2024  Next Visit: 09/12/2024  Last Visit: 06/12/2024  DX: Rheumatoid arthritis of multiple sites with negative rheumatoid factor   Current Dose per office note on 06/12/2024: reduce the dose of prednisone  to 7.5 mg milligram p.o. daily after a week and then to 5 mg p.o. daily the following week if tolerated.  She may have to stay on prednisone  5 mg p.o. daily.    Okay to refill prednisone  5mg ?

## 2024-07-25 ENCOUNTER — Other Ambulatory Visit: Payer: Self-pay | Admitting: Rheumatology

## 2024-08-01 ENCOUNTER — Other Ambulatory Visit: Payer: Self-pay

## 2024-08-01 ENCOUNTER — Other Ambulatory Visit (HOSPITAL_COMMUNITY): Payer: Self-pay

## 2024-08-01 NOTE — Progress Notes (Signed)
 Specialty Pharmacy Refill Coordination Note  Spoke with Milarose Savich is a 73 y.o. female contacted today regarding refills of specialty medication(s) Tocilizumab -aazg (Tyenne )  Doses on hand: 1 for 08/03/24 (Patient delayed previous dose (forgot).)  Injection date: 08/17/24   Patient requested: Delivery   Delivery date: 08/14/24   Verified address: [address] [place] [address]  Medication will be filled on 08/13/24.

## 2024-08-01 NOTE — Progress Notes (Signed)
 Husband called back and states that patient was mistaken. She used her last injection on 10/10 and does not have any additional on hand. Next injection is due for 10/24. Will ship 10/21 for delivery 10/22 instead.

## 2024-08-02 ENCOUNTER — Encounter: Payer: Self-pay | Admitting: Internal Medicine

## 2024-08-03 ENCOUNTER — Ambulatory Visit: Admitting: Internal Medicine

## 2024-08-03 DIAGNOSIS — H16042 Marginal corneal ulcer, left eye: Secondary | ICD-10-CM | POA: Diagnosis not present

## 2024-08-03 DIAGNOSIS — H0100A Unspecified blepharitis right eye, upper and lower eyelids: Secondary | ICD-10-CM | POA: Diagnosis not present

## 2024-08-03 DIAGNOSIS — J8489 Other specified interstitial pulmonary diseases: Secondary | ICD-10-CM

## 2024-08-06 DIAGNOSIS — H209 Unspecified iridocyclitis: Secondary | ICD-10-CM | POA: Diagnosis not present

## 2024-08-06 DIAGNOSIS — H16042 Marginal corneal ulcer, left eye: Secondary | ICD-10-CM | POA: Diagnosis not present

## 2024-08-06 DIAGNOSIS — M3501 Sicca syndrome with keratoconjunctivitis: Secondary | ICD-10-CM | POA: Diagnosis not present

## 2024-08-07 ENCOUNTER — Other Ambulatory Visit: Payer: Self-pay

## 2024-08-14 DIAGNOSIS — M3501 Sicca syndrome with keratoconjunctivitis: Secondary | ICD-10-CM | POA: Diagnosis not present

## 2024-08-14 DIAGNOSIS — H16042 Marginal corneal ulcer, left eye: Secondary | ICD-10-CM | POA: Diagnosis not present

## 2024-08-28 ENCOUNTER — Other Ambulatory Visit: Payer: Self-pay

## 2024-08-29 NOTE — Progress Notes (Deleted)
 Office Visit Note  Patient: Tina Berger             Date of Birth: 1951/06/20           MRN: [id number]             PCP: Randeen Laine LABOR, MD Referring: Tower, Laine LABOR, MD Visit Date: 09/12/2024 Occupation: Data Unavailable  Subjective:    History of Present Illness: Tina Berger is a 73 y.o. female with history of seropositive rheumatoid arthritis.  Patient remains on Tyenne  (tocilizumab  biosimilar) 162 mg subcutaneous injections every 14 days and prednisone  10 mg daily.   CBC and CMP updated on 09/10/24.  Lipid panel updated on 09/10/24.  TB gold negative on 03/13/24.  Discussed the importance of holding tyenne  if she develops signs or symptoms of an infection and to resume once the infection has completely cleared.    Activities of Daily Living:  Patient reports morning stiffness for *** {minute/hour:19697}.   Patient {ACTIONS;DENIES/REPORTS:21021675::Denies} nocturnal pain.  Difficulty dressing/grooming: {ACTIONS;DENIES/REPORTS:21021675::Denies} Difficulty climbing stairs: {ACTIONS;DENIES/REPORTS:21021675::Denies} Difficulty getting out of chair: {ACTIONS;DENIES/REPORTS:21021675::Denies} Difficulty using hands for taps, buttons, cutlery, and/or writing: {ACTIONS;DENIES/REPORTS:21021675::Denies}  No Rheumatology ROS completed.   PMFS History:  Patient Active Problem List   Diagnosis Date Noted   Long term current use of oral hypoglycemic drug 06/15/2024   Hyperkalemia 03/27/2024   Aortic atherosclerosis 09/12/2023   Skin lesion 09/12/2023   Frequent falls 12/29/2022   Dysuria 09/07/2021   Rheumatoid arthritis (HCC) 05/27/2021   Chronic venous insufficiency 05/23/2021   AVM (arteriovenous malformation) 04/04/2021   CAD (coronary artery disease) 01/22/2021   Primary osteoarthritis of both knees 01/12/2021   Positive ANA (antinuclear antibody) 11/17/2020   Joint pain 11/13/2020   Current use of proton pump inhibitor 08/26/2020   Estrogen deficiency  08/21/2018   TMJ (dislocation of temporomandibular joint), initial encounter 05/05/2018   Anterior neck pain 05/05/2018   Chronic pain syndrome 11/09/2017   Chronic upper extremity pain [facility] Area of Pain) (Bilateral) (L>R) 11/09/2017   Fibromyalgia syndrome 11/09/2017   Osteoarthritis 11/09/2017   Osteoarthritis of lumbar facet joint (Bilateral) 11/09/2017   Lumbar facet arthropathy (Bilateral) 11/09/2017   Lumbar facet syndrome (Bilateral) (L>R) 11/09/2017   Lumbar foraminal stenosis (multilevel) (Bilateral) 11/09/2017   DDD (degenerative disc disease), lumbar 11/09/2017   Thoracic levoscoliosis 11/09/2017   Thoracic facet syndrome (Bilateral) (L>R) 11/09/2017   Thoracic facet arthropathy (Bilateral) (R>L) 11/09/2017   DDD (degenerative disc disease), thoracic 11/09/2017   Thoracolumbar IVDD 11/09/2017   DDD (degenerative disc disease), cervical 11/09/2017   Osteoarthritis of cervical facet (Bilateral) 11/09/2017   Grade 1 Anterolisthesis of C7 over T1 11/09/2017   Cervical foraminal stenosis (Bilateral) 11/09/2017   History of fusion of cervical spine (C5-6 ACDF) 11/09/2017   Cervical facet syndrome (Bilateral) 11/09/2017   Disorder of skeletal system 11/09/2017   Cervical radiculitis (Bilateral) 11/09/2017   Lumbar Epidural lipomatosis 11/09/2017   Chronic sacroiliac joint pain (Bilateral) (L>R) 11/09/2017   Chronic hip pain (Bilateral) (L>R) 11/09/2017   Chronic upper back pain (Primary Area of Pain) (midline) 10/24/2017   Chronic neck pain (Secondary Area of Pain) (Bilateral)  (L>R) 10/24/2017   Chronic low back pain (Fourth Area of Pain) (Bilateral) (L>R) 10/24/2017   Chronic lower extremity pain (Fifth Area of Pain) (Bilateral) (L>R) 10/24/2017   Lumbar Grade 1 Retrolisthesis of L1-2 and L2-3 10/24/2017   Other long term (current) drug therapy 10/24/2017   Other specified health status 10/24/2017   Long term current use of  opiate analgesic 10/24/2017   Long term  prescription opiate use 10/24/2017   Opiate use 10/24/2017   DM type 2 (diabetes mellitus, type 2) (HCC) 06/14/2014   Pharmacologic therapy 06/14/2014   Pedal edema    Post-menopausal 06/23/2012   Lumbar disc disease with radiculopathy 11/18/2011   HTN (hypertension) 07/30/2011   Raynaud disease 07/30/2011   Obesity 07/30/2011   Routine general medical examination at a health care facility 03/28/2011   Palpitations 04/27/2010   Depression with anxiety 06/14/2008   Vitamin D  deficiency 05/13/2008   Osteopenia 03/25/2008   Hypothyroidism 03/28/2007   Hyperlipidemia associated with type 2 diabetes mellitus (HCC) 03/28/2007   Other iron deficiency anemias 03/28/2007   PANIC ATTACK 03/28/2007   KERATOCONJUNCTIVITIS SICCA 03/28/2007   Mitral valve disorder 03/28/2007   GERD 03/28/2007   IBS 03/28/2007   Rosacea 03/28/2007   PLANTAR FASCIITIS 03/28/2007   MIGRAINES, HX OF 03/28/2007    Past Medical History:  Diagnosis Date   Acute renal insufficiency 05/31/2014   pt not aware of this diagnosis   Anemia, iron deficiency    Anxiety    Bilateral lower extremity edema    a. uses torsemide    Broken wrist    did not require surgery   Cervical dysplasia    abnormal paps   Colon cancer screening 06/14/2014   Coronary artery disease    Cough 08/08/2012   Degenerative disk disease 11/19/2011   Depression    Diabetes mellitus type II    Diverticulosis    DJD (degenerative joint disease)    Drug rash 05/22/2011   Dry eyes    Dysrhythmia    metoprolol .   Edema    Elevated liver enzymes 03/21/2012   Encounter for routine gynecological examination 06/14/2014   ESOPHAGITIS 03/28/2007   Qualifier: Hospitalized for  By: Cecilie CMA, NANNIE Ivy     Fibromyalgia    GASTRITIS 03/28/2007   Qualifier: History of  By: Cecilie CMA, NANNIE Ivy     GERD (gastroesophageal reflux disease)    Hemorrhoids    external   HLD (hyperlipidemia)    HNP (herniated nucleus pulposus) 11/1997    T6,7,8 with DJD   HTN (hypertension)    Hyperglycemia 05/13/2008   Qualifier: Diagnosis of  By: Randeen MD, Laine Caldron    Hypothyroidism    Interstitial lung disease (HCC)    pt not aware of this   Left ovarian cyst    x 3, rupture   Osteoarthritis    hands   Osteopenia    mild-11/01; improved 12/05   Other screening mammogram 08/18/2011   Palpitations    PERSONAL HISTORY ALLERGY UNSPEC MEDICINAL AGENT 03/18/2010   Qualifier: Diagnosis of  By: Randeen MD, Laine Caldron    PONV (postoperative nausea and vomiting)    Raynaud's disease    Recurrent HSV (herpes simplex virus)    lesions in nose or mouth with frequent ulcers. d/t meds and dry mouth   Rheumatoid arthritis (HCC)    Rhinitis 03/21/2012   Routine general medical examination at a health care facility 03/28/2011    Family History  Problem Relation Age of Onset   Lung cancer Mother        + smoker   Coronary artery disease Mother        relatively young   Emphysema Father        + smoker   Lymphoma Sister    Heart disease Brother    Lymphoma Brother    Parkinson's disease Brother  Anemia Brother        aplastic    Diabetes Brother    Iron deficiency Daughter    Leukemia Son    Breast cancer Neg Hx    Past Surgical History:  Procedure Laterality Date   ABDOMINAL EXPLORATION SURGERY     ACDF  1991   BIOPSY OF SKIN SUBCUTANEOUS TISSUE AND/OR MUCOUS MEMBRANE Left 04/08/2021   Procedure: BIOPSY OF SKIN SUBCUTANEOUS TISSUE AND/OR MUCOUS MEMBRANE ( REMOVAL SKIN NODULE);  Surgeon: Jama Cordella MATSU, MD;  Location: [facility];  Service: Vascular;  Laterality: Left;   CATARACT EXTRACTION W/PHACO Right 02/07/2024   Procedure: PHACOEMULSIFICATION, CATARACT, WITH IOL INSERTION 8.20 00:48.7;  Surgeon: Jaye Fallow, MD;  Location: [facility];  Service: Ophthalmology;  Laterality: Right;   CATARACT EXTRACTION W/PHACO Left 02/21/2024   Procedure: PHACOEMULSIFICATION, CATARACT, WITH IOL INSERTION 5.42 00:40.0;  Surgeon:  Jaye Fallow, MD;  Location: [facility];  Service: Ophthalmology;  Laterality: Left;   COLONOSCOPY  08/2000   Diverticulosis; hemorrhoids   HAND SURGERY Left    left thumb. PIN REMOVED   LASIK     bilateral   Social History   Tobacco Use   Smoking status: Former    Current packs/day: 0.00    Average packs/day: 1 pack/day for 5.0 years (5.0 ttl pk-yrs)    Types: Cigarettes    Start date: 10/19/1963    Quit date: 10/18/1968    Years since quitting: 55.9    Passive exposure: Past   Smokeless tobacco: Never   Tobacco comments:    quit over 40 years  Vaping Use   Vaping status: Never Used  Substance Use Topics   Alcohol use: No    Alcohol/week: 0.0 standard drinks of alcohol   Drug use: No   Social History   Social History Narrative   Married1 [employer] Manager to dean at [employer] regularly exerciseDaily caffeine use: 2/day.   Lives with husband. Feels safe in her home.     Immunization History  Administered Date(s) Administered   Fluad Quad(high Dose 65+) 08/07/2020, 07/13/2022   Fluad Trivalent(High Dose 65+) 06/27/2023   INFLUENZA, HIGH DOSE SEASONAL PF 08/14/2018, 07/03/2019   Influenza Split 06/19/2011   Influenza Whole 08/01/2009   Influenza,inj,Quad PF,6+ Mos 06/14/2014, 08/25/2015, 07/26/2016, 08/19/2017   Moderna Sars-Covid-2 Vaccination 09/17/2020   PFIZER(Purple Top)SARS-COV-2 Vaccination 11/27/2019, 12/18/2019   Pneumococcal Conjugate-13 08/25/2015   Pneumococcal Polysaccharide-23 08/25/2016   Td 11/25/2003   Tdap 06/14/2014   Zoster, Live 09/16/2014     Objective: Vital Signs: There were no vitals taken for this visit.   Physical Exam Vitals and nursing note reviewed.  Constitutional:      Appearance: She is well-developed.  HENT:     Head: Normocephalic and atraumatic.  Eyes:     Conjunctiva/sclera: Conjunctivae normal.  Cardiovascular:     Rate and Rhythm: Normal rate and regular rhythm.     Heart sounds: Normal heart sounds.   Pulmonary:     Effort: Pulmonary effort is normal.     Breath sounds: Normal breath sounds.  Abdominal:     General: Bowel sounds are normal.     Palpations: Abdomen is soft.  Musculoskeletal:     Cervical back: Normal range of motion.  Lymphadenopathy:     Cervical: No cervical adenopathy.  Skin:    General: Skin is warm and dry.     Capillary Refill: Capillary refill takes less than 2 seconds.  Neurological:     Mental Status: She is alert and  oriented to person, place, and time.  Psychiatric:        Behavior: Behavior normal.      Musculoskeletal Exam: ***  CDAI Exam: CDAI Score: -- Patient Global: --; Provider Global: -- Swollen: --; Tender: -- Joint Exam 09/12/2024   No joint exam has been documented for this visit   There is currently no information documented on the homunculus. Go to the Rheumatology activity and complete the homunculus joint exam.  Investigation: No additional findings.  Imaging: No results found.  Recent Labs: Lab Results  Component Value Date   WBC 9.2 06/12/2024   HGB 13.9 06/12/2024   PLT 315 06/12/2024   NA 140 06/15/2024   K 5.1 06/15/2024   CL 97 06/15/2024   CO2 32 06/15/2024   GLUCOSE 136 (H) 06/15/2024   BUN 35 (H) 06/15/2024   CREATININE 1.06 06/15/2024   BILITOT 0.4 06/12/2024   ALKPHOS 48 09/05/2023   AST 12 06/12/2024   ALT 16 06/12/2024   PROT 7.4 06/12/2024   ALBUMIN 4.0 09/05/2023   CALCIUM  9.2 06/15/2024   GFRAA 75 03/24/2021   QFTBGOLDPLUS NEGATIVE 03/13/2024    Speciality Comments: Arava - mood disorder HCQ-vomiting  Procedures:  No procedures performed Allergies: Ceftin  [cefuroxime ], Clonidine  derivatives, Erythromycin, Keflex  [cephalexin ], Macrobid  [nitrofurantoin ], Penicillins, Sulfonamide derivatives, Ciprofloxacin, Fluoxetine hcl, Furosemide, Gabapentin, Hydroxychloroquine , Paroxetine, Pregabalin, Tetracycline, Venlafaxine, Etodolac, Ramipril , Amitriptyline hcl, Atorvastatin, Benicar  [olmesartan   medoxomil], Cephalexin , Cetirizine hcl, Diovan [valsartan], and Naproxen sodium   Assessment / Plan:     Visit Diagnoses: Rheumatoid arthritis of multiple sites with negative rheumatoid factor (HCC)  High risk medication use - Tyenne  (tocilizumab  biosimilar) 162 mg subcutaneous injections every 14 days and prednisone  10 mg daily. try tapering prednisone  by 1 mg every 2 months.  ILD (interstitial lung disease) (HCC) - Evaluated by Dr. Geronimo on 03/01/2023: Very mild ILD: based on symptoms, walk test, pulmonary function test and CT scan.  Raynaud's disease without gangrene  Chronic left shoulder pain  Primary osteoarthritis of both knees  DDD (degenerative disc disease), cervical  History of fusion of cervical spine (C5-6 ACDF)  DDD (degenerative disc disease), thoracic  Spondylosis of lumbar spine  Fibromyalgia syndrome  Chronic pain syndrome  Long term current use of opiate analgesic  Osteopenia of multiple sites - DEXA 04/19/2023 T-score -2.1 right total femur, BMD 0.745.History of vertebral fracture and wrist fracture---would benefit from use of anablic agent.  History of vertebral fracture - Patient would benefit from the use of an anabolic agent.  History of wrist fracture - Left-fall 02/13/23.  Vitamin D  deficiency  Chronic renal impairment, stage 3a  Type 2 diabetes mellitus without complication, without long-term current use of insulin (HCC)  History of hypothyroidism  Depression with anxiety  Hx of arteriovenous malformation (AVM)  History of IBS  Primary hypertension  History of gastroesophageal reflux (GERD)  Mitral valve disorder  Hyperlipidemia associated with type 2 diabetes mellitus (HCC)  Venous ulcer of left leg (HCC)  Orders: No orders of the defined types were placed in this encounter.  No orders of the defined types were placed in this encounter.   Face-to-face time spent with patient was *** minutes. Greater than 50% of time was  spent in counseling and coordination of care.  Follow-Up Instructions: No follow-ups on file.   Waddell CHRISTELLA Craze, PA-C  Note - This record has been created using Dragon software.  Chart creation errors have been sought, but may not always  have been located. Such creation errors do not reflect  on  the standard of medical care.

## 2024-08-30 ENCOUNTER — Other Ambulatory Visit: Payer: Self-pay

## 2024-08-30 NOTE — Progress Notes (Signed)
 Specialty Pharmacy Refill Coordination Note  Tina Berger is a 73 y.o. female contacted today regarding refills of specialty medication(s) Tocilizumab -aazg (Tyenne )   Patient requested Delivery   Delivery date: 09/04/24   Verified address: [address] [place] [address]   Medication will be filled on: 09/03/24

## 2024-09-03 ENCOUNTER — Other Ambulatory Visit: Payer: Self-pay

## 2024-09-04 ENCOUNTER — Other Ambulatory Visit (HOSPITAL_COMMUNITY): Payer: Self-pay

## 2024-09-04 ENCOUNTER — Other Ambulatory Visit: Payer: Self-pay

## 2024-09-05 ENCOUNTER — Other Ambulatory Visit (HOSPITAL_COMMUNITY): Payer: Self-pay

## 2024-09-07 ENCOUNTER — Ambulatory Visit (INDEPENDENT_AMBULATORY_CARE_PROVIDER_SITE_OTHER)

## 2024-09-07 VITALS — Ht 60.0 in | Wt 179.0 lb

## 2024-09-07 DIAGNOSIS — Z1231 Encounter for screening mammogram for malignant neoplasm of breast: Secondary | ICD-10-CM

## 2024-09-07 DIAGNOSIS — Z0000 Encounter for general adult medical examination without abnormal findings: Secondary | ICD-10-CM | POA: Diagnosis not present

## 2024-09-07 NOTE — Patient Instructions (Addendum)
 Ms. Vandoren,  Thank you for taking the time for your Medicare Wellness Visit. I appreciate your continued commitment to your health goals. Please review the care plan we discussed, and feel free to reach out if I can assist you further.  Please note that Annual Wellness Visits do not include a physical exam. Some assessments may be limited, especially if the visit was conducted virtually. If needed, we may recommend an in-person follow-up with your provider.  Ongoing Care Seeing your primary care provider every 3 to 6 months helps us  monitor your health and provide consistent, personalized care.   Referrals If a referral was made during today's visit and you haven't received any updates within two weeks, please contact the referred provider directly to check on the status.  Recommended Screenings:  Health Maintenance  Topic Date Due   Zoster (Shingles) Vaccine (1 of 2) 02/06/1969   Cologuard (Stool DNA test)  09/04/2019   Complete foot exam   03/24/2023   DTaP/Tdap/Td vaccine (3 - Td or Tdap) 06/14/2024   COVID-19 Vaccine (4 - 2025-26 season) 06/18/2024   Breast Cancer Screening  09/13/2024   Flu Shot  01/15/2025*   Hemoglobin A1C  12/13/2024   Eye exam for diabetics  03/22/2025   Osteoporosis screening with Bone Density Scan  04/18/2025   Yearly kidney function blood test for diabetes  06/15/2025   Yearly kidney health urinalysis for diabetes  06/15/2025   Medicare Annual Wellness Visit  09/07/2025   Pneumococcal Vaccine for age over 35  Completed   Hepatitis C Screening  Completed   Meningitis B Vaccine  Aged Out   Colon Cancer Screening  Discontinued  *Topic was postponed. The date shown is not the original due date.       02/21/2024    9:49 AM  Advanced Directives  Does Patient Have a Medical Advance Directive? Yes  Type of Estate Agent of [place];Living will  Copy of Healthcare Power of Attorney in Chart? Yes - validated most recent copy scanned in  chart (See row information)    Vision: Annual vision screenings are recommended for early detection of glaucoma, cataracts, and diabetic retinopathy. These exams can also reveal signs of chronic conditions such as diabetes and high blood pressure.  Dental: Annual dental screenings help detect early signs of oral cancer, gum disease, and other conditions linked to overall health, including heart disease and diabetes.  Managing Pain Without Opioids Opioids are strong medicines used to treat moderate to severe pain. For some people, especially those who have long-term (chronic) pain, opioids may not be the best choice for pain management due to: Side effects like nausea, constipation, and sleepiness. The risk of addiction (opioid use disorder). The longer you take opioids, the greater your risk of addiction. Pain that lasts for more than 3 months is called chronic pain. Managing chronic pain usually requires more than one approach and is often provided by a team of health care providers working together (multidisciplinary approach). Pain management may be done at a pain management center or pain clinic. How to manage pain without the use of opioids Use non-opioid medicines Non-opioid medicines for pain may include: Over-the-counter or prescription non-steroidal anti-inflammatory drugs (NSAIDs). These may be the first medicines used for pain. They work well for muscle and bone pain, and they reduce swelling. Acetaminophen . This over-the-counter medicine may work well for milder pain but not swelling. Antidepressants. These may be used to treat chronic pain. A certain type of antidepressant (tricyclics) is  often used. These medicines are given in lower doses for pain than when used for depression. Anticonvulsants. These are usually used to treat seizures but may also reduce nerve (neuropathic) pain. Muscle relaxants. These relieve pain caused by sudden muscle tightening (spasms). You may also use a  pain medicine that is applied to the skin as a patch, cream, or gel (topical analgesic), such as a numbing medicine. These may cause fewer side effects than medicines taken by mouth. Do certain therapies as directed Some therapies can help with pain management. They include: Physical therapy. You will do exercises to gain strength and flexibility. A physical therapist may teach you exercises to move and stretch parts of your body that are weak, stiff, or painful. You can learn these exercises at physical therapy visits and practice them at home. Physical therapy may also involve: Massage. Heat wraps or applying heat or cold to affected areas. Electrical signals that interrupt pain signals (transcutaneous electrical nerve stimulation, TENS). Weak lasers that reduce pain and swelling (low-level laser therapy). Signals from your body that help you learn to regulate pain (biofeedback). Occupational therapy. This helps you to learn ways to function at home and work with less pain. Recreational therapy. This involves trying new activities or hobbies, such as a physical activity or drawing. Mental health therapy, including: Cognitive behavioral therapy (CBT). This helps you learn coping skills for dealing with pain. Acceptance and commitment therapy (ACT) to change the way you think and react to pain. Relaxation therapies, including muscle relaxation exercises and mindfulness-based stress reduction. Pain management counseling. This may be individual, family, or group counseling.  Receive medical treatments Medical treatments for pain management include: Nerve block injections. These may include a pain blocker and anti-inflammatory medicines. You may have injections: Near the spine to relieve chronic back or neck pain. Into joints to relieve back or joint pain. Into nerve areas that supply a painful area to relieve body pain. Into muscles (trigger point injections) to relieve some painful muscle  conditions. A medical device placed near your spine to help block pain signals and relieve nerve pain or chronic back pain (spinal cord stimulation device). Acupuncture. Follow these instructions at home Medicines Take over-the-counter and prescription medicines only as told by your health care provider. If you are taking pain medicine, ask your health care providers about possible side effects to watch out for. Do not drive or use heavy machinery while taking prescription opioid pain medicine. Lifestyle  Do not use drugs or alcohol to reduce pain. If you drink alcohol, limit how much you have to: 0-1 drink a day for women who are not pregnant. 0-2 drinks a day for men. Know how much alcohol is in a drink. In the [place] one drink equals one 12 oz bottle of beer (355 mL), one 5 oz glass of wine (148 mL), or one 1 oz glass of hard liquor (44 mL). Do not use any products that contain nicotine or tobacco. These products include cigarettes, chewing tobacco, and vaping devices, such as e-cigarettes. If you need help quitting, ask your health care provider. Eat a healthy diet and maintain a healthy weight. Poor diet and excess weight may make pain worse. Eat foods that are high in fiber. These include fresh fruits and vegetables, whole grains, and beans. Limit foods that are high in fat and processed sugars, such as fried and sweet foods. Exercise regularly. Exercise lowers stress and may help relieve pain. Ask your health care provider what activities and exercises are  safe for you. If your health care provider approves, join an exercise class that combines movement and stress reduction. Examples include yoga and tai chi. Get enough sleep. Lack of sleep may make pain worse. Lower stress as much as possible. Practice stress reduction techniques as told by your therapist. General instructions Work with all your pain management providers to find the treatments that work best for you. You are an  important member of your pain management team. There are many things you can do to reduce pain on your own. Consider joining an online or in-person support group for people who have chronic pain. Keep all follow-up visits. This is important. Where to find more information You can find more information about managing pain without opioids from: American Academy of Pain Medicine: painmed.org Institute for Chronic Pain: instituteforchronicpain.org American Chronic Pain Association: theacpa.org Contact a health care provider if: You have side effects from pain medicine. Your pain gets worse or does not get better with treatments or home therapy. You are struggling with anxiety or depression. Summary Many types of pain can be managed without opioids. Chronic pain may respond better to pain management without opioids. Pain is best managed when you and a team of health care providers work together. Pain management without opioids may include non-opioid medicines, medical treatments, physical therapy, mental health therapy, and lifestyle changes. Tell your health care providers if your pain gets worse or is not being managed well enough. This information is not intended to replace advice given to you by your health care provider. Make sure you discuss any questions you have with your health care provider. Document Revised: 01/14/2021 Document Reviewed: 01/14/2021 Elsevier Patient Education  2024 [employer].

## 2024-09-07 NOTE — Progress Notes (Signed)
 Chief Complaint  Patient presents with   Medicare Wellness     Subjective:  Please attest and cosign this visit due to patients primary care provider not being in the office at the time the visit was completed.  (Pt of Dr Laine Balls)   Tina Berger is a 73 y.o. female who presents for a Medicare Annual Wellness Visit.  I connected with  Tina Berger on 09/07/24 by a audio enabled telemedicine application and verified that I am speaking with the correct person using two identifiers.  Patient Location: Home  Provider Location: Office/Clinic  Persons Participating in Visit: Patient.  I discussed the limitations of evaluation and management by telemedicine. The patient expressed understanding and agreed to proceed.  Vital Signs: Because this visit was a virtual/telehealth visit, some criteria may be missing or patient reported. Any vitals not documented were not able to be obtained and vitals that have been documented are patient reported.   Allergies (verified) Ceftin  [cefuroxime ], Clonidine  derivatives, Erythromycin, Keflex  [cephalexin ], Macrobid  [nitrofurantoin ], Penicillins, Sulfonamide derivatives, Ciprofloxacin, Fluoxetine hcl, Furosemide, Gabapentin, Hydroxychloroquine , Paroxetine, Pregabalin, Tetracycline, Venlafaxine, Etodolac, Ramipril , Amitriptyline hcl, Atorvastatin, Benicar  [olmesartan  medoxomil], Cephalexin , Cetirizine hcl, Diovan [valsartan], and Naproxen sodium   History: Past Medical History:  Diagnosis Date   Acute renal insufficiency 05/31/2014   pt not aware of this diagnosis   Anemia, iron deficiency    Anxiety    Bilateral lower extremity edema    a. uses torsemide    Broken wrist    did not require surgery   Cervical dysplasia    abnormal paps   Colon cancer screening 06/14/2014   Coronary artery disease    Cough 08/08/2012   Degenerative disk disease 11/19/2011   Depression    Diabetes mellitus type II    Diverticulosis    DJD (degenerative  joint disease)    Drug rash 05/22/2011   Dry eyes    Dysrhythmia    metoprolol .   Edema    Elevated liver enzymes 03/21/2012   Encounter for routine gynecological examination 06/14/2014   ESOPHAGITIS 03/28/2007   Qualifier: Hospitalized for  By: Cecilie CMA, NANNIE Ivy     Fibromyalgia    GASTRITIS 03/28/2007   Qualifier: History of  By: Cecilie CMA, NANNIE Ivy     GERD (gastroesophageal reflux disease)    Hemorrhoids    external   HLD (hyperlipidemia)    HNP (herniated nucleus pulposus) 11/1997   T6,7,8 with DJD   HTN (hypertension)    Hyperglycemia 05/13/2008   Qualifier: Diagnosis of  By: Balls MD, Laine Caldron    Hypothyroidism    Interstitial lung disease (HCC)    pt not aware of this   Left ovarian cyst    x 3, rupture   Osteoarthritis    hands   Osteopenia    mild-11/01; improved 12/05   Other screening mammogram 08/18/2011   Palpitations    PERSONAL HISTORY ALLERGY UNSPEC MEDICINAL AGENT 03/18/2010   Qualifier: Diagnosis of  By: Balls MD, Laine Caldron    PONV (postoperative nausea and vomiting)    Raynaud's disease    Recurrent HSV (herpes simplex virus)    lesions in nose or mouth with frequent ulcers. d/t meds and dry mouth   Rheumatoid arthritis (HCC)    Rhinitis 03/21/2012   Routine general medical examination at a health care facility 03/28/2011   Past Surgical History:  Procedure Laterality Date   ABDOMINAL EXPLORATION SURGERY     ACDF  1991   BIOPSY OF  SKIN SUBCUTANEOUS TISSUE AND/OR MUCOUS MEMBRANE Left 04/08/2021   Procedure: BIOPSY OF SKIN SUBCUTANEOUS TISSUE AND/OR MUCOUS MEMBRANE ( REMOVAL SKIN NODULE);  Surgeon: Jama Cordella MATSU, MD;  Location: [facility];  Service: Vascular;  Laterality: Left;   CATARACT EXTRACTION W/PHACO Right 02/07/2024   Procedure: PHACOEMULSIFICATION, CATARACT, WITH IOL INSERTION 8.20 00:48.7;  Surgeon: Jaye Fallow, MD;  Location: [facility];  Service: Ophthalmology;  Laterality: Right;   CATARACT EXTRACTION  W/PHACO Left 02/21/2024   Procedure: PHACOEMULSIFICATION, CATARACT, WITH IOL INSERTION 5.42 00:40.0;  Surgeon: Jaye Fallow, MD;  Location: [facility];  Service: Ophthalmology;  Laterality: Left;   COLONOSCOPY  08/2000   Diverticulosis; hemorrhoids   HAND SURGERY Left    left thumb. 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 [employer]    Employer: [employer]    Comment: retired  Tobacco Use   Smoking status: Former    Current packs/day: 0.00    Average packs/day: 1 pack/day for 5.0 years (5.0 ttl pk-yrs)    Types: Cigarettes    Start date: 10/19/1963    Quit date: 10/18/1968    Years since quitting: 55.9    Passive exposure: Past   Smokeless tobacco: Never   Tobacco comments:    quit over 40 years  Vaping Use   Vaping status: Never Used  Substance and Sexual Activity   Alcohol use: No    Alcohol/week: 0.0 standard drinks of alcohol   Drug use: No   Sexual activity: Yes   Tobacco Counseling Counseling given: Not Answered Tobacco comments: quit over 40 years  SDOH Screenings   Food Insecurity: No Food Insecurity (09/07/2024)  Housing: Unknown (09/07/2024)  Transportation Needs: No Transportation Needs (09/07/2024)  Utilities: Not At Risk (09/07/2024)  Alcohol Screen: Low Risk  (05/05/2022)  Depression (PHQ2-9): Low Risk  (09/07/2024)  Financial Resource Strain: Low Risk  (06/14/2024)  Physical Activity: Inactive (09/07/2024)  Social Connections: Moderately Integrated (09/07/2024)  Stress: No  Stress Concern Present (09/07/2024)  Tobacco Use: Medium Risk (09/07/2024)  Health Literacy: Adequate Health Literacy (09/07/2024)   See flowsheets for full screening details  Depression Screen PHQ 2 & 9 Depression Scale- Over the past 2 weeks, how often have you been bothered by any of the following problems? Little interest or pleasure in doing things: 0 Feeling down, depressed, or hopeless (PHQ Adolescent also includes...irritable): 0 PHQ-2 Total Score: 0 Trouble falling or staying asleep, or sleeping too much: 0 Feeling tired or having little energy: 0 Poor appetite or overeating (PHQ Adolescent also includes...weight loss): 0 Feeling bad about yourself - or that you are a failure or have let yourself or your family down: 0 Trouble concentrating on things, such as reading the newspaper or watching television (PHQ Adolescent also includes...like school work):  0 Moving or speaking so slowly that other people could have noticed. Or the opposite - being so fidgety or restless that you have been moving around a lot more than usual: 3 (has bunions on feet) Thoughts that you would be better off dead, or of hurting yourself in some way: 0 PHQ-9 Total Score: 3 If you checked off any problems, how difficult have these problems made it for you to do your work, take care of things at home, or get along with other people?: Not difficult at all  Depression Treatment Depression Interventions/Treatment : EYV7-0 Score <4 Follow-up Not Indicated     Goals Addressed               This Visit's Progress     Patient Stated (pt-stated)        Patient states she plans to continue monitoring her sugar intake and manage arthritis       Visit info / Clinical Intake: Medicare Wellness Visit Type:: Subsequent Annual Wellness Visit Persons participating in visit:: patient Medicare Wellness Visit Mode:: Telephone If telephone:: video declined Because this visit was a virtual/telehealth visit::  vitals recorded from last visit If Telephone or Video please confirm:: I connected with the patient using audio enabled telemedicine application and verified that I am speaking with the correct person using two identifiers; I discussed the limitations of evaluation and management by telemedicine; The patient expressed understanding and agreed to proceed Patient Location:: Home Provider Location:: Office Information given by:: patient Interpreter Needed?: No Pre-visit prep was completed: yes AWV questionnaire completed by patient prior to visit?: no Living arrangements:: lives with spouse/significant other Patient's Overall Health Status Rating: good Typical amount of pain: none Does pain affect daily life?: no Are you currently prescribed opioids?: (!) yes  Dietary Habits and Nutritional Risks How many meals a day?: 3 Eats fruit and vegetables daily?: yes Most meals are obtained by: preparing own meals In the last 2 weeks, have you had any of the following?: none Diabetic:: (!) yes Any non-healing wounds?: no How often do you check your BS?: as needed Would you like to be referred to a Nutritionist or for Diabetic Management? : no  Functional Status Activities of Daily Living (to include ambulation/medication): Independent Ambulation: Independent with device- listed below Home Assistive Devices/Equipment: Eyeglasses; Cane Medication Administration: Independent Home Management: Independent Manage your own finances?: yes Primary transportation is: driving; family/friends Concerns about vision?: no *vision screening is required for WTM* Concerns about hearing?: no  Fall Screening Falls in the past year?: 0 Number of falls in past year: 0 Was there an injury with Fall?: 0 Fall Risk Category Calculator: 0 Patient Fall Risk Level: Low Fall Risk  Fall Risk Patient at Risk for Falls Due to: No Fall Risks Fall risk Follow up: Falls evaluation completed; Falls prevention  discussed  Home and Transportation Safety: All rugs have non-skid backing?: yes All stairs or steps have railings?: yes (outside also) Grab bars in the bathtub or shower?: yes Have non-skid surface in bathtub or shower?: yes Good home lighting?: yes Regular seat belt use?: yes Hospital stays in the last year:: no  Cognitive Assessment Difficulty concentrating, remembering, or making decisions? : no Will 6CIT or Mini Cog be Completed: yes What year is it?: 0 points What month is it?: 0 points Give patient an address phrase to remember (5 components): [address] [place], [place] About what time is it?: 0 points Count backwards from 20 to 1: 0 points Say the months  of the year in reverse: 0 points Repeat the address phrase from earlier: 0 points 6 CIT Score: 0 points  Advance Directives (For Healthcare) Does Patient Have a Medical Advance Directive?: Yes Does patient want to make changes to medical advance directive?: Yes (Inpatient - patient requests chaplain consult to change a medical advance directive) Type of Advance Directive: Healthcare Power of [place]; Living will Copy of Healthcare Power of Attorney in Chart?: Yes - validated most recent copy scanned in chart (See row information) Copy of Living Will in Chart?: Yes - validated most recent copy scanned in chart (See row information)  Reviewed/Updated  Reviewed/Updated: Reviewed All (Medical, Surgical, Family, Medications, Allergies, Care Teams, Patient Goals)        Objective:    Today's Vitals   09/07/24 1415  Weight: 179 lb (81.2 kg)  Height: 5' (1.524 m)   Body mass index is 34.96 kg/m.  Current Medications (verified) Outpatient Encounter Medications as of 09/07/2024  Medication Sig   Acetaminophen  (TYLENOL  ARTHRITIS PAIN PO) Take 650 mg by mouth daily.   amLODipine  (NORVASC ) 5 MG tablet TAKE 1 TABLET (5 MG TOTAL) BY MOUTH DAILY.   buPROPion  (WELLBUTRIN  XL) 150 MG 24 hr tablet TAKE 1 TABLET BY MOUTH DAILY    Chlorphen-Phenyleph-ASA (ALKA-SELTZER PLUS COLD PO) Take by mouth as needed. Just for colds   cholecalciferol (VITAMIN D3) 25 MCG (1000 UNIT) tablet Take 1,000 Units by mouth daily.   cyclobenzaprine  (FLEXERIL ) 5 MG tablet Take 5 mg by mouth daily as needed.   Diclofenac  Sodium 1.5 % SOLN Place 2 mLs onto the skin 4 (four) times daily.   DULoxetine  (CYMBALTA ) 60 MG capsule TAKE 1 CAPSULE BY MOUTH EVERY DAY   esomeprazole  (NEXIUM ) 20 MG capsule TAKE 1 CAPSULE BY MOUTH DAILY   famotidine  (PEPCID ) 40 MG tablet TAKE 1 TABLET BY MOUTH DAILY   ferrous sulfate 325 (65 FE) MG EC tablet Take 325 mg by mouth daily with breakfast.   glucose blood test strip One Touch Ultra stripts blue-To check sugar once daily and as needed for DM2 250.00   GUAIFENESIN CR PO Take by mouth as needed.   hyoscyamine  (LEVSIN  SL) 0.125 MG SL tablet Take 1 tablet (0.125 mg total) by mouth 2 (two) times daily as needed.   levothyroxine  (SYNTHROID ) 125 MCG tablet TAKE 1 TABLET BY MOUTH DAILY  BEFORE BREAKFAST   LYSINE PO Take by mouth as needed. OTC for ulcer prophylaxis   Magnesium  400 MG CAPS Take by mouth daily.   metFORMIN  (GLUCOPHAGE -XR) 500 MG 24 hr tablet TAKE 2 TABLETS BY MOUTH TWICE  DAILY WITH MEALS   metoprolol  succinate (TOPROL -XL) 50 MG 24 hr tablet TAKE 1 TABLET BY MOUTH TWICE  DAILY   MIEBO 1.338 GM/ML SOLN    naloxone (NARCAN) nasal spray 4 mg/0.1 mL    ondansetron  (ZOFRAN ) 4 MG tablet Take 1 tablet (4 mg total) by mouth every 8 (eight) hours as needed for nausea or vomiting.   oxyCODONE  (OXYCONTIN ) 10 MG 12 hr tablet Take 10 mg by mouth 3 (three) times daily as needed. Beakthrough pain   prednisoLONE acetate (PRED FORTE) 1 % ophthalmic suspension INSTILL 1 DROP INTO RIGHT EYE FOUR TIMES A DAY TAPER AS INSTRUCTED   predniSONE  (DELTASONE ) 1 MG tablet Take 4 tablets (4 mg total) by mouth daily with breakfast.   predniSONE  (DELTASONE ) 5 MG tablet Take 1 tablet (5 mg total) by mouth daily with breakfast.    rosuvastatin  (CRESTOR ) 5 MG tablet TAKE 1 TABLET BY  MOUTH TWICE WEEKLY. NEEDS OFFICE VISIT   Tocilizumab -aazg (TYENNE ) 162 MG/0.9ML SOAJ Inject 162 mg into the skin every 14 (fourteen) days.   vitamin B-12 (CYANOCOBALAMIN ) 500 MCG tablet Take 500 mcg by mouth daily.   XTAMPZA  ER 36 MG C12A Take 1 capsule by mouth every 12 (twelve) hours.   Xylitol (XYLIMELTS MT) Use as directed in the mouth or throat.   Facility-Administered Encounter Medications as of 09/07/2024  Medication   albuterol  (PROVENTIL ) (2.5 MG/3ML) 0.083% nebulizer solution 2.5 mg   Hearing/Vision screen Hearing Screening - Comments:: Denies hearing difficulties   Vision Screening - Comments:: Wears eyeglasses for reading - up to date with routine eye exams with [facility] - Dr Devonda Immunizations and Health Maintenance Health Maintenance  Topic Date Due   FOOT EXAM  03/24/2023   DTaP/Tdap/Td (3 - Td or Tdap) 06/14/2024   COVID-19 Vaccine (4 - 2025-26 season) 06/18/2024   Mammogram  09/13/2024   Zoster Vaccines- Shingrix (1 of 2) 12/08/2024 (Originally 02/06/1970)   Influenza Vaccine  01/15/2025 (Originally 05/18/2024)   Fecal DNA (Cologuard)  09/07/2025 (Originally 09/04/2019)   HEMOGLOBIN A1C  12/13/2024   OPHTHALMOLOGY EXAM  03/22/2025   Bone Density Scan  04/18/2025   Diabetic kidney evaluation - eGFR measurement  06/15/2025   Diabetic kidney evaluation - Urine ACR  06/15/2025   Medicare Annual Wellness (AWV)  09/07/2025   Pneumococcal Vaccine: 50+ Years  Completed   Hepatitis C Screening  Completed   Meningococcal B Vaccine  Aged Out   Colonoscopy  Discontinued        Assessment/Plan:  This is a routine wellness examination for Faron.  I have recommended that this patient have a Cologuard/Colonoscopy but she declines at this time. I have discussed the risks and benefits of this procedure with her. The patient verbalizes understanding.   Patient Care Team: Tower, Laine LABOR, MD as PCP -  General Perla, Evalene PARAS, MD as Consulting Physician (Cardiology) Fate Morna SAILOR, [facility] (Inactive) as Pharmacist (Pharmacist) Jaye Fallow, MD as Referring Physician (Ophthalmology) Jaye Fallow, MD as Referring Physician (Ophthalmology)  I have personally reviewed and noted the following in the patient's chart:   Medical and social history Use of alcohol, tobacco or illicit drugs  Current medications and supplements including opioid prescriptions. Functional ability and status Nutritional status Physical activity Advanced directives List of other physicians Hospitalizations, surgeries, and ER visits in previous 12 months Vitals Screenings to include cognitive, depression, and falls Referrals and appointments  Orders Placed This Encounter  Procedures   MM 3D SCREENING MAMMOGRAM BILATERAL BREAST    Standing Status:   Future    Expiration Date:   09/07/2025    Reason for Exam (SYMPTOM  OR DIAGNOSIS REQUIRED):   screening for breast cancer    Preferred imaging location?:   [facility]   In addition, I have reviewed and discussed with patient certain preventive protocols, quality metrics, and best practice recommendations. A written personalized care plan for preventive services as well as general preventive health recommendations were provided to patient.   Verdie CHRISTELLA Saba, CMA   09/07/2024   Return in 1 year (on 09/07/2025).  After Visit Summary: (MyChart) Due to this being a telephonic visit, the after visit summary with patients personalized plan was offered to patient via MyChart   Nurse Notes: Pt declined to have a Cologuard/Colonoscopy (does have the Cologuard kit).  Ordered a Mammogram.  Scheduled 2026 AWV/CPE appts.

## 2024-09-08 ENCOUNTER — Other Ambulatory Visit: Payer: Self-pay | Admitting: Family Medicine

## 2024-09-09 ENCOUNTER — Telehealth: Payer: Self-pay | Admitting: Family Medicine

## 2024-09-09 DIAGNOSIS — E559 Vitamin D deficiency, unspecified: Secondary | ICD-10-CM

## 2024-09-09 DIAGNOSIS — E119 Type 2 diabetes mellitus without complications: Secondary | ICD-10-CM

## 2024-09-09 DIAGNOSIS — E1169 Type 2 diabetes mellitus with other specified complication: Secondary | ICD-10-CM

## 2024-09-09 DIAGNOSIS — I10 Essential (primary) hypertension: Secondary | ICD-10-CM

## 2024-09-09 DIAGNOSIS — Z79899 Other long term (current) drug therapy: Secondary | ICD-10-CM

## 2024-09-09 DIAGNOSIS — E039 Hypothyroidism, unspecified: Secondary | ICD-10-CM

## 2024-09-09 NOTE — Telephone Encounter (Signed)
-----   Message from Veva JINNY Ferrari sent at 08/28/2024 11:55 AM EST ----- Regarding: Lab orders for MON, 11.24.25 Patient is scheduled for CPX labs, please order future labs, Thanks , Veva

## 2024-09-10 ENCOUNTER — Ambulatory Visit: Payer: Self-pay | Admitting: Family Medicine

## 2024-09-10 ENCOUNTER — Other Ambulatory Visit

## 2024-09-10 DIAGNOSIS — E119 Type 2 diabetes mellitus without complications: Secondary | ICD-10-CM | POA: Diagnosis not present

## 2024-09-10 DIAGNOSIS — E039 Hypothyroidism, unspecified: Secondary | ICD-10-CM

## 2024-09-10 DIAGNOSIS — I10 Essential (primary) hypertension: Secondary | ICD-10-CM | POA: Diagnosis not present

## 2024-09-10 DIAGNOSIS — Z79899 Other long term (current) drug therapy: Secondary | ICD-10-CM

## 2024-09-10 DIAGNOSIS — E559 Vitamin D deficiency, unspecified: Secondary | ICD-10-CM

## 2024-09-10 DIAGNOSIS — E1169 Type 2 diabetes mellitus with other specified complication: Secondary | ICD-10-CM | POA: Diagnosis not present

## 2024-09-10 DIAGNOSIS — E785 Hyperlipidemia, unspecified: Secondary | ICD-10-CM | POA: Diagnosis not present

## 2024-09-10 LAB — LIPID PANEL
Cholesterol: 170 mg/dL (ref 0–200)
HDL: 59.1 mg/dL (ref >39.00)
LDL Cholesterol: 87 mg/dL (ref 0–99)
NonHDL: 110.7
Total CHOL/HDL Ratio: 3
Triglycerides: 118 mg/dL (ref 0.0–149.0)
VLDL: 23.6 mg/dL (ref 0.0–40.0)

## 2024-09-10 LAB — CBC WITH DIFFERENTIAL/PLATELET
Basophils Absolute: 0.1 K/uL (ref 0.0–0.1)
Basophils Relative: 1 % (ref 0.0–3.0)
Eosinophils Absolute: 0.3 K/uL (ref 0.0–0.7)
Eosinophils Relative: 3.2 % (ref 0.0–5.0)
HCT: 40.3 % (ref 36.0–46.0)
Hemoglobin: 13.2 g/dL (ref 12.0–15.0)
Lymphocytes Relative: 18.4 % (ref 12.0–46.0)
Lymphs Abs: 1.6 K/uL (ref 0.7–4.0)
MCHC: 32.8 g/dL (ref 30.0–36.0)
MCV: 90.1 fl (ref 78.0–100.0)
Monocytes Absolute: 0.8 K/uL (ref 0.1–1.0)
Monocytes Relative: 9 % (ref 3.0–12.0)
Neutro Abs: 5.9 K/uL (ref 1.4–7.7)
Neutrophils Relative %: 68.4 % (ref 43.0–77.0)
Platelets: 358 K/uL (ref 150.0–400.0)
RBC: 4.47 Mil/uL (ref 3.87–5.11)
RDW: 13.3 % (ref 11.5–15.5)
WBC: 8.6 K/uL (ref 4.0–10.5)

## 2024-09-10 LAB — VITAMIN B12: Vitamin B-12: 648 pg/mL (ref 211–911)

## 2024-09-10 LAB — VITAMIN D 25 HYDROXY (VIT D DEFICIENCY, FRACTURES): VITD: 37.99 ng/mL (ref 30.00–100.00)

## 2024-09-10 LAB — COMPREHENSIVE METABOLIC PANEL WITH GFR
ALT: 9 U/L (ref 0–35)
AST: 12 U/L (ref 0–37)
Albumin: 4.3 g/dL (ref 3.5–5.2)
Alkaline Phosphatase: 46 U/L (ref 39–117)
BUN: 24 mg/dL — ABNORMAL HIGH (ref 6–23)
CO2: 30 meq/L (ref 19–32)
Calcium: 9.1 mg/dL (ref 8.4–10.5)
Chloride: 100 meq/L (ref 96–112)
Creatinine, Ser: 0.81 mg/dL (ref 0.40–1.20)
GFR: 71.42 mL/min (ref >60.00)
Glucose, Bld: 137 mg/dL — ABNORMAL HIGH (ref 70–99)
Potassium: 4.4 meq/L (ref 3.5–5.1)
Sodium: 140 meq/L (ref 135–145)
Total Bilirubin: 0.4 mg/dL (ref 0.2–1.2)
Total Protein: 7 g/dL (ref 6.0–8.3)

## 2024-09-10 LAB — TSH: TSH: 4.02 u[IU]/mL (ref 0.35–5.50)

## 2024-09-10 LAB — HEMOGLOBIN A1C: Hgb A1c MFr Bld: 6.4 % (ref 4.6–6.5)

## 2024-09-12 ENCOUNTER — Ambulatory Visit: Admitting: Physician Assistant

## 2024-09-12 DIAGNOSIS — I83029 Varicose veins of left lower extremity with ulcer of unspecified site: Secondary | ICD-10-CM

## 2024-09-12 DIAGNOSIS — M0609 Rheumatoid arthritis without rheumatoid factor, multiple sites: Secondary | ICD-10-CM

## 2024-09-12 DIAGNOSIS — I059 Rheumatic mitral valve disease, unspecified: Secondary | ICD-10-CM

## 2024-09-12 DIAGNOSIS — N2889 Other specified disorders of kidney and ureter: Secondary | ICD-10-CM

## 2024-09-12 DIAGNOSIS — I10 Essential (primary) hypertension: Secondary | ICD-10-CM

## 2024-09-12 DIAGNOSIS — Z79891 Long term (current) use of opiate analgesic: Secondary | ICD-10-CM

## 2024-09-12 DIAGNOSIS — E119 Type 2 diabetes mellitus without complications: Secondary | ICD-10-CM

## 2024-09-12 DIAGNOSIS — M47816 Spondylosis without myelopathy or radiculopathy, lumbar region: Secondary | ICD-10-CM

## 2024-09-12 DIAGNOSIS — G894 Chronic pain syndrome: Secondary | ICD-10-CM

## 2024-09-12 DIAGNOSIS — F418 Other specified anxiety disorders: Secondary | ICD-10-CM

## 2024-09-12 DIAGNOSIS — Z8781 Personal history of (healed) traumatic fracture: Secondary | ICD-10-CM

## 2024-09-12 DIAGNOSIS — I7300 Raynaud's syndrome without gangrene: Secondary | ICD-10-CM

## 2024-09-12 DIAGNOSIS — Z8639 Personal history of other endocrine, nutritional and metabolic disease: Secondary | ICD-10-CM

## 2024-09-12 DIAGNOSIS — E559 Vitamin D deficiency, unspecified: Secondary | ICD-10-CM

## 2024-09-12 DIAGNOSIS — Z981 Arthrodesis status: Secondary | ICD-10-CM

## 2024-09-12 DIAGNOSIS — Z8774 Personal history of (corrected) congenital malformations of heart and circulatory system: Secondary | ICD-10-CM

## 2024-09-12 DIAGNOSIS — J849 Interstitial pulmonary disease, unspecified: Secondary | ICD-10-CM

## 2024-09-12 DIAGNOSIS — Z79899 Other long term (current) drug therapy: Secondary | ICD-10-CM

## 2024-09-12 DIAGNOSIS — E1169 Type 2 diabetes mellitus with other specified complication: Secondary | ICD-10-CM

## 2024-09-12 DIAGNOSIS — Z8719 Personal history of other diseases of the digestive system: Secondary | ICD-10-CM

## 2024-09-12 DIAGNOSIS — M8589 Other specified disorders of bone density and structure, multiple sites: Secondary | ICD-10-CM

## 2024-09-12 DIAGNOSIS — G8929 Other chronic pain: Secondary | ICD-10-CM

## 2024-09-12 DIAGNOSIS — M170 Bilateral primary osteoarthritis of knee: Secondary | ICD-10-CM

## 2024-09-12 DIAGNOSIS — M5134 Other intervertebral disc degeneration, thoracic region: Secondary | ICD-10-CM

## 2024-09-12 DIAGNOSIS — M797 Fibromyalgia: Secondary | ICD-10-CM

## 2024-09-12 DIAGNOSIS — M5030 Other cervical disc degeneration, unspecified cervical region: Secondary | ICD-10-CM

## 2024-09-17 ENCOUNTER — Encounter: Payer: Self-pay | Admitting: Family Medicine

## 2024-09-17 ENCOUNTER — Ambulatory Visit: Admitting: Family Medicine

## 2024-09-17 VITALS — BP 124/78 | HR 73 | Temp 98.6°F | Ht 59.75 in | Wt 179.4 lb

## 2024-09-17 DIAGNOSIS — Z0000 Encounter for general adult medical examination without abnormal findings: Secondary | ICD-10-CM

## 2024-09-17 DIAGNOSIS — E039 Hypothyroidism, unspecified: Secondary | ICD-10-CM

## 2024-09-17 DIAGNOSIS — I10 Essential (primary) hypertension: Secondary | ICD-10-CM

## 2024-09-17 DIAGNOSIS — Z79899 Other long term (current) drug therapy: Secondary | ICD-10-CM | POA: Diagnosis not present

## 2024-09-17 DIAGNOSIS — E559 Vitamin D deficiency, unspecified: Secondary | ICD-10-CM

## 2024-09-17 DIAGNOSIS — K219 Gastro-esophageal reflux disease without esophagitis: Secondary | ICD-10-CM

## 2024-09-17 DIAGNOSIS — M0609 Rheumatoid arthritis without rheumatoid factor, multiple sites: Secondary | ICD-10-CM

## 2024-09-17 DIAGNOSIS — E785 Hyperlipidemia, unspecified: Secondary | ICD-10-CM

## 2024-09-17 DIAGNOSIS — E119 Type 2 diabetes mellitus without complications: Secondary | ICD-10-CM

## 2024-09-17 DIAGNOSIS — E1169 Type 2 diabetes mellitus with other specified complication: Secondary | ICD-10-CM | POA: Diagnosis not present

## 2024-09-17 DIAGNOSIS — I7300 Raynaud's syndrome without gangrene: Secondary | ICD-10-CM

## 2024-09-17 DIAGNOSIS — M8589 Other specified disorders of bone density and structure, multiple sites: Secondary | ICD-10-CM

## 2024-09-17 DIAGNOSIS — F418 Other specified anxiety disorders: Secondary | ICD-10-CM

## 2024-09-17 NOTE — Assessment & Plan Note (Signed)
 Disc goals for lipids and reasons to control them Rev last labs with pt Rev low sat fat diet in detail  LDL is down to 87 -good  Tolerates crestor  5 mg twice weekly (most she can tolerate) Declined addn of zetia    Has known mild cad and aortic dz

## 2024-09-17 NOTE — Progress Notes (Signed)
 Subjective:    Patient ID: Tina Berger, female    DOB: 17-Apr-1951, 73 y.o.   MRN: [id number]  HPI  Here for health maintenance exam and to review chronic medical problems   Wt Readings from Last 3 Encounters:  09/17/24 179 lb 6 oz (81.4 kg)  09/07/24 179 lb (81.2 kg)  06/15/24 179 lb (81.2 kg)   35.33 kg/m  Vitals:   09/17/24 1407  BP: 124/78  Pulse: 73  Temp: 98.6 F (37 C)  SpO2: 94%    Immunization History  Administered Date(s) Administered   Fluad Quad(high Dose 65+) 08/07/2020, 07/13/2022   Fluad Trivalent(High Dose 65+) 06/27/2023   INFLUENZA, HIGH DOSE SEASONAL PF 08/14/2018, 07/03/2019   Influenza Split 06/19/2011   Influenza Whole 08/01/2009   Influenza,inj,Quad PF,6+ Mos 06/14/2014, 08/25/2015, 07/26/2016, 08/19/2017   Moderna Sars-Covid-2 Vaccination 09/17/2020   PFIZER(Purple Top)SARS-COV-2 Vaccination 11/27/2019, 12/18/2019   Pneumococcal Conjugate-13 08/25/2015   Pneumococcal Polysaccharide-23 08/25/2016   Td 11/25/2003   Tdap 06/14/2014   Zoster, Live 09/16/2014    Health Maintenance Due  Topic Date Due   FOOT EXAM  03/24/2023   DTaP/Tdap/Td (3 - Td or Tdap) 06/14/2024   Mammogram  09/13/2024   Flu shot - ? She had it at rheum   Tetanus shot   Shingrix -plans to get   Mammogram 08/2023 Self breast exam  Gyn health-no problems   Colon cancer screening  Cologuard negative 08/2016 Colonoscopy 2011  Did get ifob kit in mail from [facility] to do that   Bone health  Dexa  04/2023  osteopenia  Falls -none  Fractures-none  Does take chronic prednisone   Supplements -taking D Last vitamin D  Lab Results  Component Value Date   25OHVITD2 <1.0 10/24/2017   25OHVITD3 65 10/24/2017   VD25OH 37.99 09/10/2024    Exercise - Limited by chronic pain but feels stronger than she did  Also foot pain - podiatry     Has regular eye care  More vision issues recently    Mood    09/17/2024    2:11 PM 09/07/2024    2:27 PM  06/15/2024   11:02 AM 03/27/2024    3:17 PM 09/12/2023    3:34 PM  Depression screen PHQ 2/9  Decreased Interest 0 0 0 0 0  Down, Depressed, Hopeless 0 0 0 0 0  PHQ - 2 Score 0 0 0 0 0  Altered sleeping 0 0 0 0 0  Tired, decreased energy 3 0 3 0 0  Change in appetite 0 0 0 0 0  Feeling bad or failure about yourself  0 0 0 0 0  Trouble concentrating 0 0 0 0 0  Moving slowly or fidgety/restless 0 3 0 0 0  Suicidal thoughts 0 0 0 0 0  PHQ-9 Score 3 3 3   0  0   Difficult doing work/chores Not difficult at all Not difficult at all Not difficult at all Not difficult at all Not difficult at all     Data saved with a previous flowsheet row definition   Wellbutrin  xl 150 mg daily  Cymbalta  60 mg daily  Also chronic pain  Hears music all the time  ? Auditory hallucination - for quite a while Hearing has been worse in general  Does not want eval for hearing aides yet   Dental issues= extraction planned tomorrow    HTN bp is stable today  No cp or palpitations or headaches or edema  No side effects to  medicines  BP Readings from Last 3 Encounters:  09/17/24 124/78  06/15/24 116/68  06/12/24 135/83    Amlodipine  5 mg daily  Metoprolol  xl 50 mg bid     Lab Results  Component Value Date   NA 140 09/10/2024   K 4.4 09/10/2024   CO2 30 09/10/2024   GLUCOSE 137 (H) 09/10/2024   BUN 24 (H) 09/10/2024   CREATININE 0.81 09/10/2024   CALCIUM  9.1 09/10/2024   GFR 71.42 09/10/2024   EGFR 74 06/12/2024   GFRNONAA >60 12/26/2022    GERD Famotidine  40 mg bid  Nexium  20 mg daily   Lab Results  Component Value Date   VITAMINB12 648 09/10/2024     DM2 From prednisone  for rheum condition  Lab Results  Component Value Date   HGBA1C 6.4 09/10/2024   HGBA1C 6.5 (H) 06/12/2024   HGBA1C 6.2 09/05/2023   Good about diet   Sent for eye exam report-taking drops   Metformin  xr 1000 mg bid  Lab Results  Component Value Date   LABMICR 14.3 07/06/2022   LABMICR See below:  09/29/2021   MICROALBUR 3.7 (H) 06/15/2024   Hyperlipidemia Lab Results  Component Value Date   CHOL 170 09/10/2024   CHOL 218 (H) 06/12/2024   CHOL 162 09/05/2023   Lab Results  Component Value Date   HDL 59.10 09/10/2024   HDL 82 06/12/2024   HDL 51.40 09/05/2023   Lab Results  Component Value Date   LDLCALC 87 09/10/2024   LDLCALC 105 (H) 06/12/2024   LDLCALC 82 09/05/2023   Lab Results  Component Value Date   TRIG 118.0 09/10/2024   TRIG 185 (H) 06/12/2024   TRIG 141.0 09/05/2023   Lab Results  Component Value Date   CHOLHDL 3 09/10/2024   CHOLHDL 2.7 06/12/2024   CHOLHDL 3 09/05/2023   Lab Results  Component Value Date   LDLDIRECT 149.0 07/26/2016   LDLDIRECT 136.0 08/25/2015   LDLDIRECT 135.6 10/08/2011   Crestor  5 mg twice weekly  Dedlined zetia  in past  Known CAD/ aortic dz     Hypothyroidism  Pt has no clinical changes No change in energy level/ hair or skin/ edema and no tremor Lab Results  Component Value Date   TSH 4.02 09/10/2024    Levothyroxine  125 mcg daily  Lab Results  Component Value Date   WBC 8.6 09/10/2024   HGB 13.2 09/10/2024   HCT 40.3 09/10/2024   MCV 90.1 09/10/2024   PLT 358.0 09/10/2024      Patient Active Problem List   Diagnosis Date Noted   Long term current use of oral hypoglycemic drug 06/15/2024   Hyperkalemia 03/27/2024   Aortic atherosclerosis 09/12/2023   Frequent falls 12/29/2022   Rheumatoid arthritis (HCC) 05/27/2021   Chronic venous insufficiency 05/23/2021   AVM (arteriovenous malformation) 04/04/2021   CAD (coronary artery disease) 01/22/2021   Primary osteoarthritis of both knees 01/12/2021   Positive ANA (antinuclear antibody) 11/17/2020   Joint pain 11/13/2020   Current use of proton pump inhibitor 08/26/2020   Estrogen deficiency 08/21/2018   TMJ (dislocation of temporomandibular joint), initial encounter 05/05/2018   Anterior neck pain 05/05/2018   Chronic pain syndrome 11/09/2017    Chronic upper extremity pain [facility] Area of Pain) (Bilateral) (L>R) 11/09/2017   Fibromyalgia syndrome 11/09/2017   Osteoarthritis 11/09/2017   Osteoarthritis of lumbar facet joint (Bilateral) 11/09/2017   Lumbar facet arthropathy (Bilateral) 11/09/2017   Lumbar facet syndrome (Bilateral) (L>R) 11/09/2017   Lumbar foraminal stenosis (  multilevel) (Bilateral) 11/09/2017   DDD (degenerative disc disease), lumbar 11/09/2017   Thoracic levoscoliosis 11/09/2017   Thoracic facet syndrome (Bilateral) (L>R) 11/09/2017   Thoracic facet arthropathy (Bilateral) (R>L) 11/09/2017   DDD (degenerative disc disease), thoracic 11/09/2017   Thoracolumbar IVDD 11/09/2017   DDD (degenerative disc disease), cervical 11/09/2017   Osteoarthritis of cervical facet (Bilateral) 11/09/2017   Grade 1 Anterolisthesis of C7 over T1 11/09/2017   Cervical foraminal stenosis (Bilateral) 11/09/2017   History of fusion of cervical spine (C5-6 ACDF) 11/09/2017   Cervical facet syndrome (Bilateral) 11/09/2017   Disorder of skeletal system 11/09/2017   Cervical radiculitis (Bilateral) 11/09/2017   Lumbar Epidural lipomatosis 11/09/2017   Chronic sacroiliac joint pain (Bilateral) (L>R) 11/09/2017   Chronic hip pain (Bilateral) (L>R) 11/09/2017   Chronic upper back pain (Primary Area of Pain) (midline) 10/24/2017   Chronic neck pain (Secondary Area of Pain) (Bilateral)  (L>R) 10/24/2017   Chronic low back pain (Fourth Area of Pain) (Bilateral) (L>R) 10/24/2017   Chronic lower extremity pain (Fifth Area of Pain) (Bilateral) (L>R) 10/24/2017   Lumbar Grade 1 Retrolisthesis of L1-2 and L2-3 10/24/2017   Other long term (current) drug therapy 10/24/2017   Other specified health status 10/24/2017   Long term current use of opiate analgesic 10/24/2017   Long term prescription opiate use 10/24/2017   Opiate use 10/24/2017   DM type 2 (diabetes mellitus, type 2) (HCC) 06/14/2014   Pharmacologic therapy 06/14/2014   Pedal  edema    Post-menopausal 06/23/2012   Lumbar disc disease with radiculopathy 11/18/2011   HTN (hypertension) 07/30/2011   Raynaud disease 07/30/2011   Morbid obesity (HCC) 07/30/2011   Routine general medical examination at a health care facility 03/28/2011   Palpitations 04/27/2010   Depression with anxiety 06/14/2008   Vitamin D  deficiency 05/13/2008   Osteopenia 03/25/2008   Hypothyroidism 03/28/2007   Hyperlipidemia associated with type 2 diabetes mellitus (HCC) 03/28/2007   Other iron deficiency anemias 03/28/2007   PANIC ATTACK 03/28/2007   KERATOCONJUNCTIVITIS SICCA 03/28/2007   Mitral valve disorder 03/28/2007   GERD 03/28/2007   IBS 03/28/2007   Rosacea 03/28/2007   PLANTAR FASCIITIS 03/28/2007   MIGRAINES, HX OF 03/28/2007   Past Medical History:  Diagnosis Date   Acute renal insufficiency 05/31/2014   pt not aware of this diagnosis   Anemia, iron deficiency    Anxiety    Bilateral lower extremity edema    a. uses torsemide    Broken wrist    did not require surgery   Cervical dysplasia    abnormal paps   Colon cancer screening 06/14/2014   Coronary artery disease    Cough 08/08/2012   Degenerative disk disease 11/19/2011   Depression    Diabetes mellitus type II    Diverticulosis    DJD (degenerative joint disease)    Drug rash 05/22/2011   Dry eyes    Dysrhythmia    metoprolol .   Edema    Elevated liver enzymes 03/21/2012   Encounter for routine gynecological examination 06/14/2014   ESOPHAGITIS 03/28/2007   Qualifier: Hospitalized for  By: Cecilie CMA, NANNIE Ivy     Fibromyalgia    GASTRITIS 03/28/2007   Qualifier: History of  By: Cecilie CMA, NANNIE Ivy     GERD (gastroesophageal reflux disease)    Hemorrhoids    external   HLD (hyperlipidemia)    HNP (herniated nucleus pulposus) 11/1997   T6,7,8 with DJD   HTN (hypertension)    Hyperglycemia 05/13/2008   Qualifier:  Diagnosis of  By: Randeen MD, Laine Caldron    Hypothyroidism     Interstitial lung disease (HCC)    pt not aware of this   Left ovarian cyst    x 3, rupture   Osteoarthritis    hands   Osteopenia    mild-11/01; improved 12/05   Other screening mammogram 08/18/2011   Palpitations    PERSONAL HISTORY ALLERGY UNSPEC MEDICINAL AGENT 03/18/2010   Qualifier: Diagnosis of  By: Randeen MD, Laine Caldron    PONV (postoperative nausea and vomiting)    Raynaud's disease    Recurrent HSV (herpes simplex virus)    lesions in nose or mouth with frequent ulcers. d/t meds and dry mouth   Rheumatoid arthritis (HCC)    Rhinitis 03/21/2012   Routine general medical examination at a health care facility 03/28/2011   Past Surgical History:  Procedure Laterality Date   ABDOMINAL EXPLORATION SURGERY     ACDF  1991   BIOPSY OF SKIN SUBCUTANEOUS TISSUE AND/OR MUCOUS MEMBRANE Left 04/08/2021   Procedure: BIOPSY OF SKIN SUBCUTANEOUS TISSUE AND/OR MUCOUS MEMBRANE ( REMOVAL SKIN NODULE);  Surgeon: Jama Cordella MATSU, MD;  Location: [facility];  Service: Vascular;  Laterality: Left;   CATARACT EXTRACTION W/PHACO Right 02/07/2024   Procedure: PHACOEMULSIFICATION, CATARACT, WITH IOL INSERTION 8.20 00:48.7;  Surgeon: Jaye Fallow, MD;  Location: [facility];  Service: Ophthalmology;  Laterality: Right;   CATARACT EXTRACTION W/PHACO Left 02/21/2024   Procedure: PHACOEMULSIFICATION, CATARACT, WITH IOL INSERTION 5.42 00:40.0;  Surgeon: Jaye Fallow, MD;  Location: [facility];  Service: Ophthalmology;  Laterality: Left;   COLONOSCOPY  08/2000   Diverticulosis; hemorrhoids   HAND SURGERY Left    left thumb. PIN REMOVED   LASIK     bilateral   Social History   Tobacco Use   Smoking status: Former    Current packs/day: 0.00    Average packs/day: 1 pack/day for 5.0 years (5.0 ttl pk-yrs)    Types: Cigarettes    Start date: 10/19/1963    Quit date: 10/18/1968    Years since quitting: 55.9    Passive exposure: Past   Smokeless tobacco: Never   Tobacco comments:     quit over 40 years  Vaping Use   Vaping status: Never Used  Substance Use Topics   Alcohol use: No    Alcohol/week: 0.0 standard drinks of alcohol   Drug use: No   Family History  Problem Relation Age of Onset   Lung cancer Mother        + smoker   Coronary artery disease Mother        relatively young   Emphysema Father        + smoker   Lymphoma Sister    Heart disease Brother    Lymphoma Brother    Parkinson's disease Brother    Anemia Brother        aplastic    Diabetes Brother    Iron deficiency Daughter    Leukemia Son    Breast cancer Neg Hx    Allergies  Allergen Reactions   Ceftin  [Cefuroxime ] Swelling    Swelling, legs turn blue.  Legs swelling, then blue, then a rash   Clonidine  Derivatives     Swelling of lips   Erythromycin Swelling    Rash, swollen gums    Keflex  [Cephalexin ] Anaphylaxis    Chest was broken out in a rash. Lips were swelling. Took a few days to occur, but it kept getting  worse.                                                                                                                                                                                                   Macrobid  [Nitrofurantoin ]     REACTION: ? Lung problem    Penicillins Anaphylaxis    Pt had a daughter with an allergy to this at a young age. Patient developed blisters anywhere her child touched her. Also, if she used the bathroom after daughter did while on keflex , it would cause a reaction for her. Lips and roof of mouth swelled up also.   Sulfonamide Derivatives Swelling    Rash, swollen gums, lips   Ciprofloxacin     REACTION: ? rash vs sun rxn. Rash    Fluoxetine Hcl     REACTION: stomach problems. Severe pain in abdomen. Could not function d/t pain   Furosemide Swelling    REACTION: swelling worsened in legs once taking   Gabapentin Other (See Comments)    REACTION: edema of feet. Unable to get shoes on   Hydroxychloroquine  Itching    Pt report itching  felt like pre-anaphylaxis she has had with other medications   Paroxetine     REACTION: weight gain (30 pound weight gain   Pregabalin     REACTION: swelling of feet and legs. Unable to get shoes on.   Tetracycline     REACTION:inflammed genitals.    Venlafaxine     REACTION: sweating profusely   Etodolac     REACTION: reaction not known   Ramipril      Hyperkalemia    Amitriptyline Hcl     REACTION: sedating   Atorvastatin     REACTION: muscle twitch and pain   Benicar  [Olmesartan  Medoxomil]     Muscle pain    Cephalexin  Hives and Rash   Cetirizine Hcl     REACTION: headache   Diovan [Valsartan]     Thought it made her feel confused   Naproxen Sodium Other (See Comments)    REACTION: edema of feet and legs.  Not good for ckd   Current Outpatient Medications on File Prior to Visit  Medication Sig Dispense Refill   Acetaminophen  (TYLENOL  ARTHRITIS PAIN PO) Take 650 mg by mouth daily.     amLODipine  (NORVASC ) 5 MG tablet TAKE 1 TABLET (5 MG TOTAL) BY MOUTH DAILY. 90 tablet 0   buPROPion  (WELLBUTRIN  XL) 150 MG 24 hr tablet TAKE 1 TABLET BY MOUTH DAILY 90 tablet 0   Chlorphen-Phenyleph-ASA (ALKA-SELTZER PLUS COLD PO) Take by mouth as needed. Just for colds  cholecalciferol (VITAMIN D3) 25 MCG (1000 UNIT) tablet Take 1,000 Units by mouth daily.     cyclobenzaprine  (FLEXERIL ) 5 MG tablet Take 5 mg by mouth daily as needed.     Diclofenac  Sodium 1.5 % SOLN Place 2 mLs onto the skin 4 (four) times daily. 3 Bottle 3   DULoxetine  (CYMBALTA ) 60 MG capsule TAKE 1 CAPSULE BY MOUTH EVERY DAY 90 capsule 3   esomeprazole  (NEXIUM ) 20 MG capsule TAKE 1 CAPSULE BY MOUTH DAILY 90 capsule 0   famotidine  (PEPCID ) 40 MG tablet TAKE 1 TABLET BY MOUTH DAILY 90 tablet 0   ferrous sulfate 325 (65 FE) MG EC tablet Take 325 mg by mouth daily with breakfast.     glucose blood test strip One Touch Ultra stripts blue-To check sugar once daily and as needed for DM2 250.00 100 each 3   GUAIFENESIN CR PO  Take by mouth as needed.     hyoscyamine  (LEVSIN  SL) 0.125 MG SL tablet Take 1 tablet (0.125 mg total) by mouth 2 (two) times daily as needed.     levothyroxine  (SYNTHROID ) 125 MCG tablet TAKE 1 TABLET BY MOUTH DAILY  BEFORE BREAKFAST 90 tablet 0   LYSINE PO Take by mouth as needed. OTC for ulcer prophylaxis     Magnesium  400 MG CAPS Take by mouth daily.     metFORMIN  (GLUCOPHAGE -XR) 500 MG 24 hr tablet TAKE 2 TABLETS BY MOUTH TWICE  DAILY WITH MEALS 360 tablet 0   metoprolol  succinate (TOPROL -XL) 50 MG 24 hr tablet TAKE 1 TABLET BY MOUTH TWICE  DAILY 180 tablet 0   MIEBO 1.338 GM/ML SOLN      naloxone (NARCAN) nasal spray 4 mg/0.1 mL      neomycin-polymyxin-dexameth (MAXITROL) 0.1 % OINT Place 1 Application into both eyes at bedtime.     ondansetron  (ZOFRAN ) 4 MG tablet Take 1 tablet (4 mg total) by mouth every 8 (eight) hours as needed for nausea or vomiting. 20 tablet 0   oxyCODONE  (OXYCONTIN ) 10 MG 12 hr tablet Take 10 mg by mouth 3 (three) times daily as needed. Beakthrough pain     prednisoLONE acetate (PRED FORTE) 1 % ophthalmic suspension INSTILL 1 DROP INTO RIGHT EYE FOUR TIMES A DAY TAPER AS INSTRUCTED     predniSONE  (DELTASONE ) 10 MG tablet Take 10 mg by mouth daily.     rosuvastatin  (CRESTOR ) 5 MG tablet TAKE 1 TABLET BY MOUTH TWICE  WEEKLY 29 tablet 0   Tocilizumab -aazg (TYENNE ) 162 MG/0.9ML SOAJ Inject 162 mg into the skin every 14 (fourteen) days. 5.4 mL 0   vitamin B-12 (CYANOCOBALAMIN ) 500 MCG tablet Take 500 mcg by mouth daily.     XTAMPZA  ER 36 MG C12A Take 1 capsule by mouth every 12 (twelve) hours.     Xylitol (XYLIMELTS MT) Use as directed in the mouth or throat.     Current Facility-Administered Medications on File Prior to Visit  Medication Dose Route Frequency Provider Last Rate Last Admin   albuterol  (PROVENTIL ) (2.5 MG/3ML) 0.083% nebulizer solution 2.5 mg  2.5 mg Nebulization Once Ramaswamy, Murali, MD        Review of Systems  Constitutional:  Positive for  fatigue. Negative for activity change, appetite change, fever and unexpected weight change.  HENT:  Positive for dental problem. Negative for congestion, ear pain, rhinorrhea, sinus pressure and sore throat.   Eyes:  Positive for visual disturbance. Negative for pain and redness.  Respiratory:  Negative for cough, shortness of breath and wheezing.  Cardiovascular:  Negative for chest pain and palpitations.  Gastrointestinal:  Negative for abdominal pain, blood in stool, constipation and diarrhea.  Endocrine: Negative for polydipsia and polyuria.  Genitourinary:  Negative for dysuria, frequency and urgency.  Musculoskeletal:  Positive for arthralgias, myalgias and neck pain. Negative for back pain.  Skin:  Negative for pallor and rash.  Allergic/Immunologic: Negative for environmental allergies.  Neurological:  Negative for dizziness, syncope and headaches.  Hematological:  Negative for adenopathy. Does not bruise/bleed easily.  Psychiatric/Behavioral:  Negative for decreased concentration and dysphoric mood. The patient is not nervous/anxious.        Ongoing ? Auditory hallucination -hears music at home        Objective:   Physical Exam Constitutional:      General: She is not in acute distress.    Appearance: Normal appearance. She is well-developed. She is obese. She is not ill-appearing or diaphoretic.  HENT:     Head: Normocephalic and atraumatic.     Right Ear: Tympanic membrane, ear canal and external ear normal.     Left Ear: Tympanic membrane, ear canal and external ear normal.     Nose: Nose normal. No congestion.     Mouth/Throat:     Mouth: Mucous membranes are moist.     Pharynx: Oropharynx is clear. No posterior oropharyngeal erythema.  Eyes:     General: No scleral icterus.    Extraocular Movements: Extraocular movements intact.     Conjunctiva/sclera: Conjunctivae normal.     Pupils: Pupils are equal, round, and reactive to light.  Neck:     Thyroid : No  thyromegaly.     Vascular: No carotid bruit or JVD.  Cardiovascular:     Rate and Rhythm: Normal rate and regular rhythm.     Pulses: Normal pulses.     Heart sounds: Normal heart sounds.     No gallop.  Pulmonary:     Effort: Pulmonary effort is normal. No respiratory distress.     Breath sounds: Normal breath sounds. No wheezing.     Comments: Good air exch Chest:     Chest wall: No tenderness.  Abdominal:     General: Bowel sounds are normal. There is no distension or abdominal bruit.     Palpations: Abdomen is soft. There is no mass.     Tenderness: There is no abdominal tenderness.     Hernia: No hernia is present.  Genitourinary:    Comments: Declines breast exam Musculoskeletal:        General: No tenderness. Normal range of motion.     Cervical back: Normal range of motion and neck supple. No rigidity. No muscular tenderness.     Right lower leg: No edema.     Left lower leg: No edema.     Comments: No kyphosis   Baseline foot/toe deformity with skin changes of raynaud's   Lymphadenopathy:     Cervical: No cervical adenopathy.  Skin:    General: Skin is warm and dry.     Coloration: Skin is not pale.     Findings: No erythema or rash.     Comments: Solar lentigines diffusely  Scattered sks   Neurological:     Mental Status: She is alert. Mental status is at baseline.     Cranial Nerves: No cranial nerve deficit.     Motor: No abnormal muscle tone.     Coordination: Coordination normal.     Gait: Gait normal.     Deep Tendon Reflexes:  Reflexes are normal and symmetric. Reflexes normal.  Psychiatric:        Mood and Affect: Mood normal.        Cognition and Memory: Cognition and memory normal.           Assessment & Plan:   Problem List Items Addressed This Visit       Cardiovascular and Mediastinum   Raynaud disease   Stable Reassuring exam  Takes metoprolol  and amlodipine        HTN (hypertension)    BP Readings from Last 1 Encounters:   09/17/24 124/78  Continues metoprolol  xl 50 mg bid  Amlodipine  5 mg daily  Helps her raynaud's but medications do cause some pedal edema   Most recent labs reviewed  Disc lifstyle change with low sodium diet and exercise            Digestive   GERD   Continues aggressive treatment with  Famotidine  40 mg bid Nexium  20 mg daily   Urged to avoid triggers  Watching vitamin levels         Endocrine   Hypothyroidism   Hypothyroidism  Pt has no clinical changes No change in energy level/ hair or skin/ edema and no tremor Lab Results  Component Value Date   TSH 4.02 09/10/2024    Continues levothyroxine  125 mcg daily      Hyperlipidemia associated with type 2 diabetes mellitus (HCC)   Disc goals for lipids and reasons to control them Rev last labs with pt Rev low sat fat diet in detail  LDL is down to 87 -good  Tolerates crestor  5 mg twice weekly (most she can tolerate) Declined addn of zetia    Has known mild cad and aortic dz       DM type 2 (diabetes mellitus, type 2) (HCC)   Lab Results  Component Value Date   HGBA1C 6.4 09/10/2024   HGBA1C 6.5 (H) 06/12/2024   HGBA1C 6.2 09/05/2023  Due to chronic prednisone   Fair control  Microalb utd  Baseline foot exam Continues metformin  xr 1000 mg bid  Stable renal status Diet has improved          Musculoskeletal and Integument   Rheumatoid arthritis (HCC)   Continues low dose prednisone  (causing dm) along with toclizumab  Pt thinks she may have had flu shot there / Dr Bernadine and will check       Relevant Medications   predniSONE  (DELTASONE ) 10 MG tablet   Osteopenia   Dexa 04/2023 No falls or fractures  Taking vit D  On chronic prednisone   Plan to check next dexa after 04/2025  Cannot start any osteoporosis medication until dental work is done  Discussed fall prevention, supplements and exercise for bone density           Other   Vitamin D  deficiency   Last vitamin D  Lab Results   Component Value Date   25OHVITD2 <1.0 10/24/2017   25OHVITD3 65 10/24/2017   VD25OH 37.99 09/10/2024   Vitamin D  level is therapeutic with current supplementation Disc importance of this to bone and overall health       Routine general medical examination at a health care facility - Primary   Reviewed health habits including diet and exercise and skin cancer prevention Reviewed appropriate screening tests for age  Also reviewed health mt list, fam hx and immunization status , as well as social and family history   See HPI Labs reviewed and ordered Health Maintenance  Topic  Date Due   Complete foot exam   03/24/2023   DTaP/Tdap/Td vaccine (3 - Td or Tdap) 06/14/2024   Breast Cancer Screening  09/13/2024   Zoster (Shingles) Vaccine (1 of 2) 12/08/2024*   Flu Shot  01/15/2025*   Cologuard (Stool DNA test)  09/07/2025*   COVID-19 Vaccine (4 - 2025-26 season) 10/03/2025*   Hemoglobin A1C  03/10/2025   Eye exam for diabetics  03/22/2025   Osteoporosis screening with Bone Density Scan  04/18/2025   Yearly kidney health urinalysis for diabetes  06/15/2025   Medicare Annual Wellness Visit  09/07/2025   Yearly kidney function blood test for diabetes  09/10/2025   Pneumococcal Vaccine for age over 66  Completed   Hepatitis C Screening  Completed   Meningitis B Vaccine  Aged Out   Colon Cancer Screening  Discontinued  *Topic was postponed. The date shown is not the original due date.    Pt will check to see if she had flu shot at rheumatology Plans to get Td and shingrix at pharmacy Plans to do ifob kit from medicare for colon cancer screening  Discussed fall prevention, supplements and exercise for bone density  No falls or fractures  PHQ 3 due to chronic fatigue        Morbid obesity (HCC)   Bmi now over 35 with co morbidities  Discussed how this problem influences overall health and the risks it imposes  Reviewed plan for weight loss with lower calorie diet (via better food  choices (lower glycemic and portion control) along with exercise building up to or more than 30 minutes 5 days per week including some aerobic activity and strength training         Depression with anxiety   Doing well with current medicines  Wellbutrin  xl 150 mg daily  Cymbalta  60 mg daily   Tolerates well  In setting of chronic pain  Encouraged physical activity when able   PHQ 3       Current use of proton pump inhibitor   Lab Results  Component Value Date   VITAMINB12 648 09/10/2024   Last vitamin D  Lab Results  Component Value Date   25OHVITD2 <1.0 10/24/2017   25OHVITD3 65 10/24/2017   VD25OH 37.99 09/10/2024    Continues to supplement

## 2024-09-17 NOTE — Assessment & Plan Note (Signed)
 Doing well with current medicines  Wellbutrin  xl 150 mg daily  Cymbalta  60 mg daily   Tolerates well  In setting of chronic pain  Encouraged physical activity when able   PHQ 3

## 2024-09-17 NOTE — Assessment & Plan Note (Signed)
 Lab Results  Component Value Date   VITAMINB12 648 09/10/2024   Last vitamin D  Lab Results  Component Value Date   25OHVITD2 <1.0 10/24/2017   25OHVITD3 65 10/24/2017   VD25OH 37.99 09/10/2024    Continues to supplement

## 2024-09-17 NOTE — Assessment & Plan Note (Signed)
 Continues aggressive treatment with  Famotidine  40 mg bid Nexium  20 mg daily   Urged to avoid triggers  Watching vitamin levels

## 2024-09-17 NOTE — Assessment & Plan Note (Signed)
 Dexa 04/2023 No falls or fractures  Taking vit D  On chronic prednisone   Plan to check next dexa after 04/2025  Cannot start any osteoporosis medication until dental work is done  Discussed fall prevention, supplements and exercise for bone density

## 2024-09-17 NOTE — Assessment & Plan Note (Signed)
 Last vitamin D  Lab Results  Component Value Date   25OHVITD2 <1.0 10/24/2017   25OHVITD3 65 10/24/2017   VD25OH 37.99 09/10/2024   Vitamin D  level is therapeutic with current supplementation Disc importance of this to bone and overall health

## 2024-09-17 NOTE — Assessment & Plan Note (Signed)
 Reviewed health habits including diet and exercise and skin cancer prevention Reviewed appropriate screening tests for age  Also reviewed health mt list, fam hx and immunization status , as well as social and family history   See HPI Labs reviewed and ordered Health Maintenance  Topic Date Due   Complete foot exam   03/24/2023   DTaP/Tdap/Td vaccine (3 - Td or Tdap) 06/14/2024   Breast Cancer Screening  09/13/2024   Zoster (Shingles) Vaccine (1 of 2) 12/08/2024*   Flu Shot  01/15/2025*   Cologuard (Stool DNA test)  09/07/2025*   COVID-19 Vaccine (4 - 2025-26 season) 10/03/2025*   Hemoglobin A1C  03/10/2025   Eye exam for diabetics  03/22/2025   Osteoporosis screening with Bone Density Scan  04/18/2025   Yearly kidney health urinalysis for diabetes  06/15/2025   Medicare Annual Wellness Visit  09/07/2025   Yearly kidney function blood test for diabetes  09/10/2025   Pneumococcal Vaccine for age over 33  Completed   Hepatitis C Screening  Completed   Meningitis B Vaccine  Aged Out   Colon Cancer Screening  Discontinued  *Topic was postponed. The date shown is not the original due date.    Pt will check to see if she had flu shot at rheumatology Plans to get Td and shingrix at pharmacy Plans to do ifob kit from medicare for colon cancer screening  Discussed fall prevention, supplements and exercise for bone density  No falls or fractures  PHQ 3 due to chronic fatigue

## 2024-09-17 NOTE — Assessment & Plan Note (Signed)
 Lab Results  Component Value Date   HGBA1C 6.4 09/10/2024   HGBA1C 6.5 (H) 06/12/2024   HGBA1C 6.2 09/05/2023  Due to chronic prednisone   Fair control  Microalb utd  Baseline foot exam Continues metformin  xr 1000 mg bid  Stable renal status Diet has improved

## 2024-09-17 NOTE — Assessment & Plan Note (Addendum)
 Continues low dose prednisone  (causing dm) along with toclizumab  Pt thinks she may have had flu shot there / Dr Bernadine and will check

## 2024-09-17 NOTE — Assessment & Plan Note (Signed)
 Stable Reassuring exam  Takes metoprolol  and amlodipine 

## 2024-09-17 NOTE — Assessment & Plan Note (Signed)
 Hypothyroidism  Pt has no clinical changes No change in energy level/ hair or skin/ edema and no tremor Lab Results  Component Value Date   TSH 4.02 09/10/2024    Continues levothyroxine  125 mcg daily

## 2024-09-17 NOTE — Assessment & Plan Note (Addendum)
 Bmi now over 34 with co morbidities  Discussed how this problem influences overall health and the risks it imposes  Reviewed plan for weight loss with lower calorie diet (via better food choices (lower glycemic and portion control) along with exercise building up to or more than 30 minutes 5 days per week including some aerobic activity and strength training

## 2024-09-17 NOTE — Assessment & Plan Note (Signed)
  BP Readings from Last 1 Encounters:  09/17/24 124/78  Continues metoprolol  xl 50 mg bid  Amlodipine  5 mg daily  Helps her raynaud's but medications do cause some pedal edema   Most recent labs reviewed  Disc lifstyle change with low sodium diet and exercise

## 2024-09-17 NOTE — Patient Instructions (Addendum)
 Make sure you had a flu shot this season -if not, you can get here or at the pharmacy  You are due for a tetanus shot and shingrix -at the pharmacy   For exercise do what you can  Add some strength training to your routine, this is important for bone and brain health and can reduce your risk of falls and help your body use insulin properly and regulate weight  Light weights, exercise bands , and internet videos are a good way to start  Yoga (chair or regular), machines , floor exercises or a gym with machines are also good options    Take care of yourself Avoid added sugars in your diet when you can  Try to get most of your carbohydrates from produce (with the exception of white potatoes) and whole grains Eat less bread/pasta/rice/snack foods/cereals/sweets and other items from the middle of the grocery store (processed carbs)  Please schedule your mammogram !

## 2024-09-19 ENCOUNTER — Other Ambulatory Visit: Payer: Self-pay

## 2024-09-20 ENCOUNTER — Other Ambulatory Visit: Payer: Self-pay

## 2024-09-23 NOTE — Patient Instructions (Incomplete)
 Interstitial lung disease due to connective tissue disease (HCC) Drug allergy, multiple  Rheumatoid arthritis of multiple sites with negative rheumatoid factor (HCC) Raynaud's disease without gangrene  -You have very mild interstitial lung disease and this is due to rheumatoid arthritis   - [based on symptoms, walk test, pulmonary function test and CT scan] -Since 2013 there is very mild progression through 2022 - There is also progression on CT very mild 2022 -> 2025 but PFT stable 2024 -> 2025   Plan  - continue prednisone  for RA - restart generic actemra  - hold off  pulmonary anti33fibrotic due to side effect profile but if there is progression then yes strongly consider it - do spirometry and dlco in 4- months   Follow-up - 4-5 months do spirometry and DLCO  on return to see Dr. Geronimo for follow-up in a 15-minute slot  -Symptom score and simple walking desaturation test at follow-up

## 2024-09-23 NOTE — Progress Notes (Deleted)
 OV 06/02/2021  Subjective:  Patient ID: Tina Berger, female , DOB: 1951-01-04 , age 73 y.o. , MRN: [id number] , ADDRESS: [address] [place] [address] PCP Tower, Laine LABOR, MD Patient Care Team: Tower, Laine LABOR, MD as PCP - General Perla, Evalene PARAS, MD as Consulting Physician (Cardiology) Myra Browning, [facility] as Pharmacist (Pharmacist)  This Provider for this visit: Treatment Team:  Attending Provider: Geronimo Amel, MD    06/02/2021 -   Chief Complaint  Patient presents with   Consult    Pt is being referred by Dr. Dolphus for ILD work up. Pt had PFT performed today.  Pt states that she has had problems with SOB for years but it has been just getting worse.     HPI Tina Berger 73 y.o. -referred by Dr. Dolphus for concern of rheumatoid arthritis ILD.  History is provided by the patient and also review of the records.  She tells me that in 2013 she used to work at [employer] and was exposed to chlorine at work and a few days later there was a birthday party and in the swimming pool there is a lot of chlorine.  And a few days after that started having significant shortness of breath with exertion relieved by rest.  Was hypoxemic was admitted for 5 days.  During this time she was also nitrofurantoin  and was informed she had ILD because of chlorine exposure and nitrofurantoin .  After that she recovered and has been stable without any respiratory symptoms.  Then starting October 2021 started noticing increased fatigue and also in decreased mobility and hand and joint stiffness.  She did see Dr. JONETTA in March and April 2022.  Was given a diagnosis of rheumatoid arthritis negative rheumatoid factor positive CCP.  Mixed with methotrexate was discussed but later patient declined it.  Then in April 2022 started Imuran .  She tells me she took Imuran  for 8 weeks but then had vomiting fatigue fogginess and also scared about getting leukemia because she lost her son from  leukemia.  Therefore she stopped it.  She feels prednisone  will help her a lot but she says that she has been advised about the side effects of prednisone .  She has multiple drug allergies.  She says because of her ILD she was advised about 2 options for her rheumatoid arthritis.  I am presuming based on chart review this is methotrexate and Imuran .  She says the sores are too restrictive.  She specifically wants to know how I would treat her ILD.  [facility] Comprehensive ILD Questionnaire  Symptoms:    Past Medical History : Positive for rheumatoid arthritis diagnosed in 2022.  She has history of irregular heart rate not otherwise specified.  She sees Dr. Gollan.  She says she has had echocardiogram and stress test 2010 years ago.  She is on Lopressor .  She has coronary artery calcification on the CT but she says she sees a cardiologist regularly.  She also has GERD and hiatal hernia.  She has diabetes she has thyroid  disease not otherwise specified.  She has obesity   has a past medical history of Acute renal insufficiency (05/31/2014), Anemia, iron deficiency, Anxiety, Bilateral lower extremity edema, Cervical dysplasia, Colon cancer screening (06/14/2014), Coronary artery disease, Cough (08/08/2012), Degenerative disk disease (11/19/2011), Depression, Diabetes mellitus type II, Diverticulosis, DJD (degenerative joint disease), Drug rash (05/22/2011), Dry eyes, Dysrhythmia, Edema, Elevated liver enzymes (03/21/2012), Encounter for routine gynecological examination (06/14/2014), ESOPHAGITIS (03/28/2007),  Fibromyalgia, GASTRITIS (03/28/2007), GERD (gastroesophageal reflux disease), Hemorrhoids, HLD (hyperlipidemia), HNP (herniated nucleus pulposus) (11/1997), HTN (hypertension), Hyperglycemia (05/13/2008), Hypothyroidism, Interstitial lung disease (HCC), Left ovarian cyst, Osteoarthritis, Osteopenia, Other screening mammogram (08/18/2011), Palpitations, PERSONAL HISTORY ALLERGY UNSPEC  MEDICINAL AGENT (03/18/2010), PONV (postoperative nausea and vomiting), Raynaud's disease, Recurrent HSV (herpes simplex virus), Rheumatoid arthritis (HCC), Rhinitis (03/21/2012), and Routine general medical examination at a health care facility (03/28/2011).   has a past surgical history that includes Hand surgery (Left); Colonoscopy (08/2000); LASIK; Abdominal exploration surgery; ACDF (1991); and Biopsy of skin subcutaneous tissue and/or mucous membrane (Left, 04/08/2021).    ROS: She has significant fatigue she has arthralgia.  She has Raynaud's she has dry eyes dry mouth.  She has acid reflux.     FAMILY HISTORY of LUNG DISEASE: Positive for asthma in the family not otherwise specified but negative for COPD or pulmonary fibrosis or collagen vascular disease or vasculitis   EXPOSURE HISTORY: Smoked between 22 and 1970 to 10 cigarettes/day.  Does not smoke marijuana but no pipe no cocaine no marijuana use.  No intravenous drug use    HOME and HOBBY DETAILS : [place]  She has lived in the home for 42 years the age of the home is 20.  Built in 1969.  No dampness.  No mold or mildew in the house no humidifier use no CPAP use no nebulizer use no steam iron use no Jacuzzi use.  No misting [place] no parakeets inside the house no pet gerbils or hamsters.  No feather pillows or do way.  No mold in the [facility] duct.  No music habits no gardening habits no bird feathers.  There was mold and mildew in the bathroom at work but that was in the remote past has not worked in many years.  The building was 74 years old   OCCUPATIONAL HISTORY (122 questions) : No organic or inorganic antigen exposure history.  Greater than 122 item   PULMONARY TOXICITY HISTORY (27 items): She did take Macrobid /nitrofurantoin  in 2013.  She took Imuran  for 8 weeks in 2022       Narrative & Impression  CLINICAL DATA:  73 year old female with history of shortness of breath. History of  rheumatoid arthritis. Shortness of breath with activity.   EXAM: CT CHEST WITHOUT CONTRAST   TECHNIQUE: Multidetector CT imaging of the chest was performed following the standard protocol without intravenous contrast. High resolution imaging of the lungs, as well as inspiratory and expiratory imaging, was performed.   COMPARISON:  Chest CT 04/03/2012.   FINDINGS: Cardiovascular: Heart size is normal. There is no significant pericardial fluid, thickening or pericardial calcification. There is aortic atherosclerosis, as well as atherosclerosis of the great vessels of the mediastinum and the coronary arteries, including calcified atherosclerotic plaque in the left main, left anterior descending and right coronary arteries.   Mediastinum/Nodes: No pathologically enlarged mediastinal or hilar lymph nodes. Please note that accurate exclusion of hilar adenopathy is limited on noncontrast CT scans. Esophagus is unremarkable in appearance. No axillary lymphadenopathy.   Lungs/Pleura: High-resolution images demonstrate patchy areas of ground-glass attenuation, septal thickening, mild subpleural reticulation and peripheral bronchiolectasis scattered throughout the lungs bilaterally. Findings have a definitive craniocaudal gradient and appear mildly progressive compared to the prior study, with slightly less ground-glass attenuation but more septal thickening and subpleural reticulation. No frank honeycombing. Inspiratory and expiratory imaging demonstrates some mild air trapping indicative of small airways disease. In addition, there is collapse of the trachea and  mainstem bronchi during expiration. No acute consolidative airspace disease. No pleural effusions. No suspicious appearing pulmonary nodules or masses are noted.   Upper Abdomen: Unremarkable.   Musculoskeletal: There are no aggressive appearing lytic or blastic lesions noted in the visualized portions of the skeleton.    IMPRESSION: 1. The appearance of the lungs is compatible with interstitial lung disease which is mildly progressive when compared to remote prior study from 9 years ago. The spectrum of findings on today's examination is categorized as probable usual interstitial pneumonia (UIP) per current ATS guidelines. 2. Aortic atherosclerosis, in addition to left main and 2 vessel coronary artery disease. Assessment for potential risk factor modification, dietary therapy or pharmacologic therapy may be warranted, if clinically indicated.   Aortic Atherosclerosis (ICD10-I70.0).     Electronically Signed   By: Toribio Aye M.D.   On: 01/19/2021 18:20       OV 03/26/2022  Subjective:  Patient ID: Tina Berger, female , DOB: [birth year] , age 62 y.o. , MRN: [id number] , ADDRESS: [address] [place] [address] PCP Tower, Laine LABOR, MD Patient Care Team: Tower, Laine LABOR, MD as PCP - General Perla, Evalene PARAS, MD as Consulting Physician (Cardiology) Myra Browning, [facility] as Pharmacist (Pharmacist)  This Provider for this visit: Treatment Team:  Attending Provider: Geronimo Amel, MD    03/26/2022 -   Chief Complaint  Patient presents with   Follow-up    PFT performed today.  Pt states she has been doing okay since last visit and denies any complaints.     HPI Tina Berger 73 y.o. -presents for follow-up.  She is on observation from a pulmonary perspective for interstitial lung disease.  This is because she has mild disease burden and we do not know if it is progressive phenotype as yet.  Since her last visit from a respiratory standpoint she says she is stable.  In fact her lung function is showing stability with FVC slightly better and the DLCO being the same.  They actually look normal.  Her main issue is that she is continuing to have significant issues with rheumatoid arthritis.  She did not tolerate leflunomide  it caused some knuckle swelling confusing it for rheumatoid  arthritis itself but when she stopped leflunomide  approximately a month ago this got better.  She is on Actemra  which she is tolerating well.  She has been on Actemra  since October 2022 she is having significant joint pain.  Early in the morning her pain can be 5 out of 5.  She follows with Dr. JONETTA for this.  Her pulmonary function test and walking desaturation test are stable.    OV 03/01/2023  Subjective:  Patient ID: Tina Berger, female , DOB: August 30, 1951 , age 35 y.o. , MRN: [id number] , ADDRESS: [address] [place] [address] PCP Tower, Laine LABOR, MD Patient Care Team: Tower, Laine LABOR, MD as PCP - General Perla, Evalene PARAS, MD as Consulting Physician (Cardiology) Fate Morna SAILOR, [facility] as Pharmacist (Pharmacist)  This Provider for this visit: Treatment Team:  Attending Provider: Geronimo Amel, MD    03/01/2023 -   Chief Complaint  Patient presents with   Follow-up    F/up on PFT     HPI Tina Berger 72 y.o. -returns for follow-up.  I could not get a good finger pulse oximetry on her because of her Raynaud's history.  Nevertheless she feels stable from a respiratory standpoint.  Her symptom score is 0.  Her main  issue is her rheumatoid arthritis.  She has had 3 falls since December 2023.  3 of them backwards.  The most recent one was February 14, 2023 when she slipped on a cat toy on the kitchen hardwood floor and she sustained a fracture to her left wrist but also significant bruising to her left side of the face.  In fact the bruising is still evident today.  She is working on her balance issues.  She plans to join a balance class.  She is in significant pain from her rheumatoid arthritis.  She continues on Actemra .  She is on prednisone  which she likes to get off.  She is also on pain medicine clinic.  She plans to transfer to rheumatology in [place] because this would be closer to her house.  But she is still plans to come here for pulmonary studies.  She had PFT  today shows a slight decline but it could be range bound.  She is willing for a closer follow-up.  Last CT scan of the chest was in April 2022.    OV 06/27/2023  Subjective:  Patient ID: Tina Berger, female , DOB: [birth year] , age 59 y.o. , MRN: [id number] , ADDRESS: [address] [place] [address] PCP Tower, Laine LABOR, MD Patient Care Team: Tower, Laine LABOR, MD as PCP - General Perla, Evalene PARAS, MD as Consulting Physician (Cardiology) Fate Morna SAILOR, [facility] (Inactive) as Pharmacist (Pharmacist)  This Provider for this visit: Treatment Team:  Attending Provider: Geronimo Amel, MD    06/27/2023 -   Chief Complaint  Patient presents with   Follow-up    Breathing is overall doing well. She wakes up with some PND in the am. She denies cough. Would like a flu vaccine today.    Tina Berger 73 y.o. -presents for follow-up.  Since her last visit in May 2024.  She states the last fall was in April 2024.  She is dealing with the impact from the fall in terms of fractured teeth and dentition.  No further falls she is doing daily walks.  She continues 5 mg daily prednisone  she is also on Actemra .  She continues there is no side effects.  She says that overall her pain control has improved.  Her trigger finger is getting better and she is feeling better with the Actemra .  Dyspnea is the same.  Pulmonary function test done I reviewed results of the same.  There is no change in this.  Last CT scan of the chest was in 2022  Reviewed her vaccination.  She will have the high-dose flu shot today.  Noticed that her shingles vaccine is a live vaccine in 2015.  Since then she has been immunosuppressed.  Recommended Shingrix which is an inactivated vaccine.  Also recommended RSV vaccine.     CT Chest data from date: April 2022  - personally visualized and independently interpreted : NO - my findings are: as below  IMPRESSION: 1. The appearance of the lungs is compatible with interstitial  lung disease which is mildly progressive when compared to remote prior study from 9 years ago. The spectrum of findings on today's examination is categorized as probable usual interstitial pneumonia (UIP) per current ATS guidelines. 2. Aortic atherosclerosis, in addition to left main and 2 vessel coronary artery disease. Assessment for potential risk factor modification, dietary therapy or pharmacologic therapy may be warranted, if clinically indicated.   Aortic Atherosclerosis (ICD10-I70.0).     Electronically Signed   By:  Toribio Aye M.D.   On: 01/19/2021 18:20      OV 04/09/2024  Subjective:  Patient ID: Tina Berger, female , DOB: 1951/10/13 , age 45 y.o. , MRN: [id number] , ADDRESS: [address] [place] [address] PCP Tower, Laine LABOR, MD Patient Care Team: Tower, Laine LABOR, MD as PCP - General Perla, Evalene PARAS, MD as Consulting Physician (Cardiology) Fate Morna SAILOR, [facility] (Inactive) as Pharmacist (Pharmacist)  This Provider for this visit: Treatment Team:  Attending Provider: Geronimo Amel, MD    04/09/2024 -   Chief Complaint  Patient presents with   Follow-up    PFT repeated today. Breathing is overall stable. She has some throat clearing and cough, esp at night- non prod.      HPI Tina Berger 73 y.o. -returns for follow-up.  Last visit September 2024.  History is gained from her and also review of the medical records.  Review of the medical records 03/13/2024 with Cheryl Birmingham rheumatology PA shows that patient underwent right Eye cataract removal February 07, 2024 left eye from 05/23/2024.  And in the context of this tocilizumab  was held.  It appears tocilizumab  has been held since March 2025 following a right eye infection that subsequently resulted in the bilateral cataract surgeries.  Her vision has been restored and is pretty good.  Tocilizumab  has not been restarted now because of insurance issues and she is going to go generic it appears that  she has gotten clearance.  Once that is started current prednisone  of 10 mg/day will be tapered down by 1 mg every 2 months.  From a respiratory standpoint symptom score is stable.  She did not do a sit/stand.  ILD according to radiology slightly worse in 2022.  The PFT seems to suggest that although the very mild decline definitely stable in the last 1 year.      CT Chest data from date:   - personally visualized and independently interpreted : no - my findings are: below  IMPRESSION: 1. Spectrum of findings compatible with mild to moderate basilar predominant fibrotic interstitial lung disease with small focus of honeycombing, slightly progressed since 01/16/2021 CT. Findings are consistent with UIP per consensus guidelines: Diagnosis of Idiopathic Pulmonary Fibrosis: An Official ATS/ERS/JRS/ALAT Clinical Practice Guideline. Am PARAS Honey Crit Care Med Vol 198, Iss 5, [phone], Jun 18 2017. 2. Small layering left pleural effusion. 3. Chronic severe tracheobronchomalacia. 4. One vessel coronary atherosclerosis. 5. Chronic severe anterior T12 vertebral compression fracture. 6.  Aortic Atherosclerosis (ICD10-I70.0).     Electronically Signed   By: Selinda LABOR Blue M.D.   On: 01/20/2024 10:16    OV 09/23/2024  Subjective:  Patient ID: Tina Berger, female , DOB: 11-10-1950 , age 23 y.o. , MRN: [id number] , ADDRESS: [address] [place] [address] PCP Tower, Laine LABOR, MD Patient Care Team: Tower, Laine LABOR, MD as PCP - General Perla, Evalene PARAS, MD as Consulting Physician (Cardiology) Fate Morna SAILOR, [facility] (Inactive) as Pharmacist (Pharmacist) Jaye Fallow, MD as Referring Physician (Ophthalmology) Jaye Fallow, MD as Referring Physician (Ophthalmology)  This Provider for this visit: Treatment Team:  Attending Provider: Geronimo Amel, MD   Follow-up rheumatoid arthritis ILD -Mild disease burden  - Not on antifibrotic =-Last CT 2022 and April  2025 -Last PFT September 2024High risk prescription with  immunosuppressed status on Actemra  -since October 2022. Chronic 5mg  per day prednisone    09/23/2024 -  No chief complaint on file.    HPI Tina Berger  73 y.o. -       SYMPTOM SCALE - ILD 06/02/2021  03/26/2022  06/27/2023  04/09/2024 Actermra on hold due to ey iessuea dn cost0  O2 use ra ra ra ra  Shortness of Breath 0 -> 5 scale with 5 being worst (score 6 If unable to do)     At rest 0 0 0 0  Simple tasks - showers, clothes change, eating, shaving 0 1 1 0  Household (dishes, doing bed, laundry) 0 2 2 3   Shopping 0 2 2 3   Walking level at own pace 0 2 2 1   Walking up Stairs 0 3 3 1   Total (30-36) Dyspnea Score 0 10 10 8   How bad is your cough? 0 0 1 mild  How bad is your fatigue yes 4 2 constant  How bad is nausea 0 1 1 Very low  How bad is vomiting?  0 0 0 0  How bad is diarrhea? 0 0 0 0  How bad is anxiety? ? 2 3 0  How bad is depression ? 2 0 0  Pain  5  Prob with feet     Simple office walk 185 feet x  3 laps goal with forehead probe 06/02/2021  03/26/2022  04/09/2024 refused  O2 used ra    Number laps completed 3    Comments about pace avg    Resting Pulse Ox/HR 97% and 71/min 95% and HR 80   Final Pulse Ox/HR 93% and 105/min 93% and HR 107   Desaturated </= 88% no no   Desaturated <= 3% points Yes 4 points Yes 2 points   Got Tachycardic >/= 90/min yes yes   Symptoms at end of test Moderate dyspnea Mild dyspnea    Miscellaneous comments x       PFT     Latest Ref Rng & Units 04/09/2024   10:11 AM 06/23/2023   11:31 AM 03/01/2023    8:51 AM 03/26/2022   10:15 AM 06/02/2021   12:50 PM  PFT Results  FVC-Pre L 2.29  2.25  2.22  2.35  2.16   FVC-Predicted Pre % 96  93  87  91  79   FVC-Post L     2.14   FVC-Predicted Post %     78   Pre FEV1/FVC % % 79  83  83  85  81   Post FEV1/FCV % %     86   FEV1-Pre L 1.81  1.88  1.84  2.00  1.75   FEV1-Predicted Pre % 101  104  97  103  85   FEV1-Post L      1.83   DLCO uncorrected ml/min/mmHg 13.56  12.88  13.10  15.17  14.84   DLCO UNC% % 82  76  75  87  81   DLCO corrected ml/min/mmHg   13.99  14.70  14.84   DLCO COR %Predicted %   80  84  81   DLVA Predicted % 84  78  89  86  104   TLC L  4.00    4.29   TLC % Predicted %  89    90   RV % Predicted %  79    89        LAB RESULTS last 96 hours No results found.       has a past medical history of Acute renal insufficiency (05/31/2014), Anemia, iron deficiency, Anxiety, Bilateral lower extremity edema, Broken wrist,  Cervical dysplasia, Colon cancer screening (06/14/2014), Coronary artery disease, Cough (08/08/2012), Degenerative disk disease (11/19/2011), Depression, Diabetes mellitus type II, Diverticulosis, DJD (degenerative joint disease), Drug rash (05/22/2011), Dry eyes, Dysrhythmia, Edema, Elevated liver enzymes (03/21/2012), Encounter for routine gynecological examination (06/14/2014), ESOPHAGITIS (03/28/2007), Fibromyalgia, GASTRITIS (03/28/2007), GERD (gastroesophageal reflux disease), Hemorrhoids, HLD (hyperlipidemia), HNP (herniated nucleus pulposus) (11/1997), HTN (hypertension), Hyperglycemia (05/13/2008), Hypothyroidism, Interstitial lung disease (HCC), Left ovarian cyst, Osteoarthritis, Osteopenia, Other screening mammogram (08/18/2011), Palpitations, 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   reports that she quit smoking about 55 years ago. Her smoking use included cigarettes. She started smoking about 60 years ago. She has a 5 pack-year smoking history. She has been exposed to tobacco smoke. She has never used smokeless tobacco.  Past Surgical History:  Procedure Laterality Date   ABDOMINAL EXPLORATION SURGERY     ACDF  1991    BIOPSY OF SKIN SUBCUTANEOUS TISSUE AND/OR MUCOUS MEMBRANE Left 04/08/2021   Procedure: BIOPSY OF SKIN SUBCUTANEOUS TISSUE AND/OR MUCOUS MEMBRANE ( REMOVAL SKIN NODULE);  Surgeon: Jama Cordella MATSU, MD;  Location: [facility];  Service: Vascular;  Laterality: Left;   CATARACT EXTRACTION W/PHACO Right 02/07/2024   Procedure: PHACOEMULSIFICATION, CATARACT, WITH IOL INSERTION 8.20 00:48.7;  Surgeon: Jaye Fallow, MD;  Location: [facility];  Service: Ophthalmology;  Laterality: Right;   CATARACT EXTRACTION W/PHACO Left 02/21/2024   Procedure: PHACOEMULSIFICATION, CATARACT, WITH IOL INSERTION 5.42 00:40.0;  Surgeon: Jaye Fallow, MD;  Location: [facility];  Service: Ophthalmology;  Laterality: Left;   COLONOSCOPY  08/2000   Diverticulosis; hemorrhoids   HAND SURGERY Left    left thumb. PIN REMOVED   LASIK     bilateral    Allergies  Allergen Reactions   Ceftin  [Cefuroxime ] Swelling    Swelling, legs turn blue.  Legs swelling, then blue, then a rash   Clonidine  Derivatives     Swelling of lips   Erythromycin Swelling    Rash, swollen gums    Keflex  [Cephalexin ] Anaphylaxis    Chest was broken out in a rash. Lips were swelling. Took a few days to occur, but it kept getting worse.                                                                                                                                                                                                   Macrobid  [Nitrofurantoin ]     REACTION: ? Lung problem  Penicillins Anaphylaxis    Pt had a daughter with an allergy to this at a young age. Patient developed blisters anywhere her child touched her. Also, if she used the bathroom after daughter did while on keflex , it would cause a reaction for her. Lips and roof of mouth swelled up also.   Sulfonamide Derivatives Swelling    Rash, swollen gums, lips   Ciprofloxacin     REACTION: ? rash vs sun rxn. Rash    Fluoxetine Hcl     REACTION: stomach  problems. Severe pain in abdomen. Could not function d/t pain   Furosemide Swelling    REACTION: swelling worsened in legs once taking   Gabapentin Other (See Comments)    REACTION: edema of feet. Unable to get shoes on   Hydroxychloroquine  Itching    Pt report itching felt like pre-anaphylaxis she has had with other medications   Paroxetine     REACTION: weight gain (30 pound weight gain   Pregabalin     REACTION: swelling of feet and legs. Unable to get shoes on.   Tetracycline     REACTION:inflammed genitals.    Venlafaxine     REACTION: sweating profusely   Etodolac     REACTION: reaction not known   Ramipril      Hyperkalemia    Amitriptyline Hcl     REACTION: sedating   Atorvastatin     REACTION: muscle twitch and pain   Benicar  [Olmesartan  Medoxomil]     Muscle pain    Cephalexin  Hives and Rash   Cetirizine Hcl     REACTION: headache   Diovan [Valsartan]     Thought it made her feel confused   Naproxen Sodium Other (See Comments)    REACTION: edema of feet and legs.  Not good for ckd    Immunization History  Administered Date(s) Administered   Fluad Quad(high Dose 65+) 08/07/2020, 07/13/2022   Fluad Trivalent(High Dose 65+) 06/27/2023   INFLUENZA, HIGH DOSE SEASONAL PF 08/14/2018, 07/03/2019   Influenza Split 06/19/2011   Influenza Whole 08/01/2009   Influenza,inj,Quad PF,6+ Mos 06/14/2014, 08/25/2015, 07/26/2016, 08/19/2017   Moderna Sars-Covid-2 Vaccination 09/17/2020   PFIZER(Purple Top)SARS-COV-2 Vaccination 11/27/2019, 12/18/2019   Pneumococcal Conjugate-13 08/25/2015   Pneumococcal Polysaccharide-23 08/25/2016   Td 11/25/2003   Tdap 06/14/2014   Zoster, Live 09/16/2014    Family History  Problem Relation Age of Onset   Lung cancer Mother        + smoker   Coronary artery disease Mother        relatively young   Emphysema Father        + smoker   Lymphoma Sister    Heart disease Brother    Lymphoma Brother    Parkinson's disease Brother     Anemia Brother        aplastic    Diabetes Brother    Iron deficiency Daughter    Leukemia Son    Breast cancer Neg Hx      Current Outpatient Medications:    Acetaminophen  (TYLENOL  ARTHRITIS PAIN PO), Take 650 mg by mouth daily., Disp: , Rfl:    amLODipine  (NORVASC ) 5 MG tablet, TAKE 1 TABLET (5 MG TOTAL) BY MOUTH DAILY., Disp: 90 tablet, Rfl: 0   buPROPion  (WELLBUTRIN  XL) 150 MG 24 hr tablet, TAKE 1 TABLET BY MOUTH DAILY, Disp: 90 tablet, Rfl: 0   Chlorphen-Phenyleph-ASA (ALKA-SELTZER PLUS COLD PO), Take by mouth as needed. Just for colds, Disp: , Rfl:    cholecalciferol (VITAMIN D3)  25 MCG (1000 UNIT) tablet, Take 1,000 Units by mouth daily., Disp: , Rfl:    cyclobenzaprine  (FLEXERIL ) 5 MG tablet, Take 5 mg by mouth daily as needed., Disp: , Rfl:    Diclofenac  Sodium 1.5 % SOLN, Place 2 mLs onto the skin 4 (four) times daily., Disp: 3 Bottle, Rfl: 3   DULoxetine  (CYMBALTA ) 60 MG capsule, TAKE 1 CAPSULE BY MOUTH EVERY DAY, Disp: 90 capsule, Rfl: 3   esomeprazole  (NEXIUM ) 20 MG capsule, TAKE 1 CAPSULE BY MOUTH DAILY, Disp: 90 capsule, Rfl: 0   famotidine  (PEPCID ) 40 MG tablet, TAKE 1 TABLET BY MOUTH DAILY, Disp: 90 tablet, Rfl: 0   ferrous sulfate 325 (65 FE) MG EC tablet, Take 325 mg by mouth daily with breakfast., Disp: , Rfl:    glucose blood test strip, One Touch Ultra stripts blue-To check sugar once daily and as needed for DM2 250.00, Disp: 100 each, Rfl: 3   GUAIFENESIN CR PO, Take by mouth as needed., Disp: , Rfl:    hyoscyamine  (LEVSIN  SL) 0.125 MG SL tablet, Take 1 tablet (0.125 mg total) by mouth 2 (two) times daily as needed., Disp: , Rfl:    levothyroxine  (SYNTHROID ) 125 MCG tablet, TAKE 1 TABLET BY MOUTH DAILY  BEFORE BREAKFAST, Disp: 90 tablet, Rfl: 0   LYSINE PO, Take by mouth as needed. OTC for ulcer prophylaxis, Disp: , Rfl:    Magnesium  400 MG CAPS, Take by mouth daily., Disp: , Rfl:    metFORMIN  (GLUCOPHAGE -XR) 500 MG 24 hr tablet, TAKE 2 TABLETS BY MOUTH TWICE  DAILY  WITH MEALS, Disp: 360 tablet, Rfl: 0   metoprolol  succinate (TOPROL -XL) 50 MG 24 hr tablet, TAKE 1 TABLET BY MOUTH TWICE  DAILY, Disp: 180 tablet, Rfl: 0   MIEBO 1.338 GM/ML SOLN, , Disp: , Rfl:    naloxone (NARCAN) nasal spray 4 mg/0.1 mL, , Disp: , Rfl:    neomycin-polymyxin-dexameth (MAXITROL) 0.1 % OINT, Place 1 Application into both eyes at bedtime., Disp: , Rfl:    ondansetron  (ZOFRAN ) 4 MG tablet, Take 1 tablet (4 mg total) by mouth every 8 (eight) hours as needed for nausea or vomiting., Disp: 20 tablet, Rfl: 0   oxyCODONE  (OXYCONTIN ) 10 MG 12 hr tablet, Take 10 mg by mouth 3 (three) times daily as needed. Beakthrough pain, Disp: , Rfl:    prednisoLONE acetate (PRED FORTE) 1 % ophthalmic suspension, INSTILL 1 DROP INTO RIGHT EYE FOUR TIMES A DAY TAPER AS INSTRUCTED, Disp: , Rfl:    predniSONE  (DELTASONE ) 10 MG tablet, Take 10 mg by mouth daily., Disp: , Rfl:    rosuvastatin  (CRESTOR ) 5 MG tablet, TAKE 1 TABLET BY MOUTH TWICE  WEEKLY, Disp: 29 tablet, Rfl: 0   Tocilizumab -aazg (TYENNE ) 162 MG/0.9ML SOAJ, Inject 162 mg into the skin every 14 (fourteen) days., Disp: 5.4 mL, Rfl: 0   vitamin B-12 (CYANOCOBALAMIN ) 500 MCG tablet, Take 500 mcg by mouth daily., Disp: , Rfl:    XTAMPZA  ER 36 MG C12A, Take 1 capsule by mouth every 12 (twelve) hours., Disp: , Rfl:    Xylitol (XYLIMELTS MT), Use as directed in the mouth or throat., Disp: , Rfl:  No current facility-administered medications for this visit.  Facility-Administered Medications Ordered in Other Visits:    albuterol  (PROVENTIL ) (2.5 MG/3ML) 0.083% nebulizer solution 2.5 mg, 2.5 mg, Nebulization, Once, Geronimo Amel, MD      Objective:   There were no vitals filed for this visit.  Estimated body mass index is 35.33 kg/m as calculated  from the following:   Height as of 09/17/24: 4' 11.75 (1.518 m).   Weight as of 09/17/24: 179 lb 6 oz (81.4 kg).  @WEIGHTCHANGE @  There were no vitals filed for this visit.   Physical  Exam   General: No distress. *** O2 at rest: *** Cane present: *** Sitting in wheel chair: *** Frail: *** Obese: *** Neuro: Alert and Oriented x 3. GCS 15. Speech normal Psych: Pleasant Resp:  Barrel Chest - ***.  Wheeze - ***, Crackles - ***, No overt respiratory distress CVS: Normal heart sounds. Murmurs - *** Ext: Stigmata of Connective Tissue Disease - *** HEENT: Normal upper airway. PEERL +. No post nasal drip        Assessment/     Assessment & Plan Interstitial lung disease due to connective tissue disease (HCC)  Multiple drug allergies  Raynaud's disease without gangrene    PLAN Patient Instructions  Interstitial lung disease due to connective tissue disease (HCC) Drug allergy, multiple  Rheumatoid arthritis of multiple sites with negative rheumatoid factor (HCC) Raynaud's disease without gangrene  -You have very mild interstitial lung disease and this is due to rheumatoid arthritis   - [based on symptoms, walk test, pulmonary function test and CT scan] -Since 2013 there is very mild progression through 2022 - There is also progression on CT very mild 2022 -> 2025 but PFT stable 2024 -> 2025   Plan  - continue prednisone  for RA - restart generic actemra  - hold off  pulmonary anti18fibrotic due to side effect profile but if there is progression then yes strongly consider it - do spirometry and dlco in 4- months   Follow-up - 4-5 months do spirometry and DLCO  on return to see Dr. Geronimo for follow-up in a 15-minute slot  -Symptom score and simple walking desaturation test at follow-up    FOLLOWUP    No follow-ups on file.    SIGNATURE    Dr. Dorethia Geronimo, M.D., F.C.C.P,  Pulmonary and Critical Care Medicine Staff Physician, [facility] Director - Interstitial Lung Disease  Program  Pulmonary Fibrosis [facility] at [facility] [place], [place], [address]  Pager: [phone], If no answer or  between  15:00h - 7:00h: call [phone] Telephone: [phone]  9:28 PM 09/23/2024   Moderate Complexity MDM OFFICE  2021 E/M guidelines, first released in 2021, with minor revisions added in 2023 and 2024 Must meet the requirements for 2 out of 3 dimensions to qualify.    Number and complexity of problems addressed Amount and/or complexity of data reviewed Risk of complications and/or morbidity  One or more chronic illness with mild exacerbation, OR progression, OR  side effects of treatment  Two or more stable chronic illnesses  One undiagnosed new problem with uncertain prognosis  One acute illness with systemic symptoms   One Acute complicated injury Must meet the requirements for 1 of 3 of the categories)  Category 1: Tests and documents, historian  Any combination of 3 of the following:  Assessment requiring an independent historian  Review of prior external note(s) from each unique source  Review of results of each unique test  Ordering of each unique test    Category 2: Interpretation of tests   Independent interpretation of a test performed by another physician/other qualified health care professional (not separately reported)  Category 3: Discuss management/tests  Discussion of management or test interpretation with external physician/other qualified health care professional/appropriate  source (not separately reported) Moderate risk of morbidity from additional diagnostic testing or treatment Examples only:  Prescription drug management  Decision regarding minor surgery with identfied patient or procedure risk factors  Decision regarding elective major surgery without identified patient or procedure risk factors  Diagnosis or treatment significantly limited by social determinants of health             HIGh Complexity  OFFICE   2021 E/M guidelines, first released in 2021, with minor revisions added in 2023. Must meet the requirements  for 2 out of 3 dimensions to qualify.    Number and complexity of problems addressed Amount and/or complexity of data reviewed Risk of complications and/or morbidity  Severe exacerbation of chronic illness  Acute or chronic illnesses that may pose a threat to life or bodily function, e.g., multiple trauma, acute MI, pulmonary embolus, severe respiratory distress, progressive rheumatoid arthritis, psychiatric illness with potential threat to self or others, peritonitis, acute renal failure, abrupt change in neurological status Must meet the requirements for 2 of 3 of the categories)  Category 1: Tests and documents, historian  Any combination of 3 of the following:  Assessment requiring an independent historian  Review of prior external note(s) from each unique source  Review of results of each unique test  Ordering of each unique test    Category 2: Interpretation of tests    Independent interpretation of a test performed by another physician/other qualified health care professional (not separately reported)  Category 3: Discuss management/tests  Discussion of management or test interpretation with external physician/other qualified health care professional/appropriate source (not separately reported)  HIGH risk of morbidity from additional diagnostic testing or treatment Examples only:  Drug therapy requiring intensive monitoring for toxicity  Decision for elective major surgery with identified pateint or procedure risk factors  Decision regarding hospitalization or escalation of level of care  Decision for DNR or to de-escalate care   Parenteral controlled  substances            LEGEND - Independent interpretation involves the interpretation of a test for which there is a CPT code, and an interpretation or report is customary. When a review and interpretation of a test is performed and documented by the provider, but not separately reported (billed), then this  would represent an independent interpretation. This report does not need to conform to the usual standards of a complete report of the test. This does not include interpretation of tests that do not have formal reports such as a complete blood count with differential and blood cultures. Examples would include reviewing a chest radiograph and documenting in the medical record an interpretation, but not separately reporting (billing) the interpretation of the chest radiograph.   An appropriate source includes professionals who are not health care professionals but may be involved in the management of the patient, such as a clinical research associate, upper officer, case manager or teacher, and does not include discussion with family or informal caregivers.    - SDOH: SDOH are the conditions in the environments where people are born, live, learn, work, play, worship, and age that affect a wide range of health, functioning, and quality-of-life outcomes and risks. (e.g., housing, food insecurity, transportation, etc.). SDOH-related Z codes ranging from Z55-Z65 are the ICD-10-CM diagnosis codes used to document SDOH data Z55 - Problems related to education and literacy Z56 - Problems related to employment and unemployment Z57 - Occupational exposure to risk factors Z58 - Problems related to physical environment Z59 -  Problems related to housing and economic circumstances [phone] - Problems related to social environment [phone] - Problems related to upbringing [phone] - Other problems related to primary support group, including family circumstances Z51 - Problems related to certain psychosocial circumstances Z65 - Problems related to other psychosocial circumstances

## 2024-09-24 ENCOUNTER — Encounter

## 2024-09-24 ENCOUNTER — Ambulatory Visit: Admitting: Internal Medicine

## 2024-09-24 DIAGNOSIS — I7300 Raynaud's syndrome without gangrene: Secondary | ICD-10-CM

## 2024-09-24 DIAGNOSIS — J8489 Other specified interstitial pulmonary diseases: Secondary | ICD-10-CM

## 2024-09-24 DIAGNOSIS — Z889 Allergy status to unspecified drugs, medicaments and biological substances status: Secondary | ICD-10-CM

## 2024-09-25 ENCOUNTER — Other Ambulatory Visit: Payer: Self-pay

## 2024-09-25 ENCOUNTER — Other Ambulatory Visit: Payer: Self-pay | Admitting: Family Medicine

## 2024-09-25 ENCOUNTER — Other Ambulatory Visit: Payer: Self-pay | Admitting: Physician Assistant

## 2024-09-25 ENCOUNTER — Encounter: Payer: Self-pay | Admitting: Family Medicine

## 2024-09-25 DIAGNOSIS — M0609 Rheumatoid arthritis without rheumatoid factor, multiple sites: Secondary | ICD-10-CM

## 2024-09-25 DIAGNOSIS — Z79899 Other long term (current) drug therapy: Secondary | ICD-10-CM

## 2024-09-25 DIAGNOSIS — J849 Interstitial pulmonary disease, unspecified: Secondary | ICD-10-CM

## 2024-09-25 MED ORDER — TYENNE 162 MG/0.9ML ~~LOC~~ SOAJ
162.0000 mg | SUBCUTANEOUS | 0 refills | Status: AC
  Filled 2024-09-25: qty 5.4, 84d supply, fill #0
  Filled 2024-09-27: qty 1.8, 28d supply, fill #0
  Filled 2024-10-22 – 2024-10-24 (×3): qty 1.8, 28d supply, fill #1
  Filled 2024-11-20: qty 1.8, 28d supply, fill #2

## 2024-09-25 NOTE — Telephone Encounter (Signed)
 Last Fill: 07/02/2024  Labs: 09/10/2024 Glucose 137, BUN 24  TB Gold: 03/13/2024 Neg    Next Visit: 11/15/2024  Last Visit: 06/12/2024  DX: Rheumatoid arthritis of multiple sites with negative rheumatoid factor   Current Dose per office note 06/12/2024:  Tyenne  (tocilizumab  biosimilar) 162 mg subcutaneous injections every 14 days   Okay to refill Tyenne ?

## 2024-09-25 NOTE — Telephone Encounter (Signed)
 Last Fill: 07/02/2024  Labs: 09/10/2024  Glucose 137 BUN 24  TB Gold: 03/13/2024  TB gold negative  Next Visit: 11/15/2024  Last Visit: 06/12/2024  DX: Rheumatoid arthritis of multiple sites with negative rheumatoid factor (HCC)   Current Dose per office note 06/12/2024: Tyenne  (tocilizumab  biosimilar) 162 mg subcutaneous injections every 14 days   Okay to refill Tyenne ?

## 2024-09-27 ENCOUNTER — Other Ambulatory Visit: Payer: Self-pay

## 2024-09-27 ENCOUNTER — Other Ambulatory Visit: Payer: Self-pay | Admitting: Pharmacy Technician

## 2024-09-27 NOTE — Progress Notes (Signed)
 Specialty Pharmacy Refill Coordination Note  Tina Berger is a 73 y.o. female contacted today regarding refills of specialty medication(s) Tocilizumab -aazg (Tyenne )   Patient requested Delivery   Delivery date: 10/02/24   Verified address: [address]  [place]   Medication will be filled on: 10/01/24

## 2024-10-01 ENCOUNTER — Other Ambulatory Visit: Payer: Self-pay

## 2024-10-02 ENCOUNTER — Other Ambulatory Visit: Payer: Self-pay | Admitting: Family Medicine

## 2024-10-02 DIAGNOSIS — F418 Other specified anxiety disorders: Secondary | ICD-10-CM

## 2024-10-02 DIAGNOSIS — E119 Type 2 diabetes mellitus without complications: Secondary | ICD-10-CM

## 2024-10-02 DIAGNOSIS — E039 Hypothyroidism, unspecified: Secondary | ICD-10-CM

## 2024-10-02 DIAGNOSIS — I10 Essential (primary) hypertension: Secondary | ICD-10-CM

## 2024-10-02 DIAGNOSIS — K219 Gastro-esophageal reflux disease without esophagitis: Secondary | ICD-10-CM

## 2024-10-07 ENCOUNTER — Other Ambulatory Visit: Payer: Self-pay | Admitting: Rheumatology

## 2024-10-07 DIAGNOSIS — M0609 Rheumatoid arthritis without rheumatoid factor, multiple sites: Secondary | ICD-10-CM

## 2024-10-08 NOTE — Telephone Encounter (Signed)
 Last Fill: 06/28/2024  Next Visit: 11/15/2024  Last Visit: 06/12/2024  Dx:  Rheumatoid arthritis of multiple sites with negative rheumatoid factor (HCC)   Current Dose per office note on 06/12/2024:  reduce the dose of prednisone  to 7.5 mg milligram p.o. daily after a week and then to 5 mg p.o. daily the following week if tolerated.  She may have to stay on prednisone  5 mg p.o. daily.   Okay to refill prednisone ?

## 2024-10-16 ENCOUNTER — Ambulatory Visit: Admitting: Adult Health

## 2024-10-16 ENCOUNTER — Encounter: Payer: Self-pay | Admitting: Adult Health

## 2024-10-16 ENCOUNTER — Encounter: Payer: Self-pay | Admitting: *Deleted

## 2024-10-16 ENCOUNTER — Ambulatory Visit (INDEPENDENT_AMBULATORY_CARE_PROVIDER_SITE_OTHER)

## 2024-10-16 VITALS — BP 121/77 | HR 94 | Temp 97.5°F | Ht 60.0 in | Wt 184.6 lb

## 2024-10-16 DIAGNOSIS — I7300 Raynaud's syndrome without gangrene: Secondary | ICD-10-CM | POA: Diagnosis not present

## 2024-10-16 DIAGNOSIS — M0609 Rheumatoid arthritis without rheumatoid factor, multiple sites: Secondary | ICD-10-CM | POA: Diagnosis not present

## 2024-10-16 DIAGNOSIS — Z23 Encounter for immunization: Secondary | ICD-10-CM | POA: Diagnosis not present

## 2024-10-16 DIAGNOSIS — J8489 Other specified interstitial pulmonary diseases: Secondary | ICD-10-CM

## 2024-10-16 DIAGNOSIS — M359 Systemic involvement of connective tissue, unspecified: Secondary | ICD-10-CM

## 2024-10-16 DIAGNOSIS — Z87891 Personal history of nicotine dependence: Secondary | ICD-10-CM | POA: Diagnosis not present

## 2024-10-16 LAB — PULMONARY FUNCTION TEST
DL/VA % pred: 92 %
DL/VA: 3.95 ml/min/mmHg/L
DLCO cor % pred: 87 %
DLCO cor: 14.47 ml/min/mmHg
DLCO unc % pred: 87 %
DLCO unc: 14.38 ml/min/mmHg
FEF 25-75 Pre: 1.6 L/s
FEF2575-%Pred-Pre: 107 %
FEV1-%Pred-Pre: 100 %
FEV1-Pre: 1.78 L
FEV1FVC-%Pred-Pre: 105 %
FEV6-%Pred-Pre: 99 %
FEV6-Pre: 2.24 L
FEV6FVC-%Pred-Pre: 105 %
FVC-%Pred-Pre: 94 %
FVC-Pre: 2.24 L
Pre FEV1/FVC ratio: 79 %
Pre FEV6/FVC Ratio: 100 %

## 2024-10-16 NOTE — Progress Notes (Signed)
 Tina Berger                                          MRN: [id number]   10/16/2024   The [facility] Quality Team Specialist reviewed this patient medical record for the purposes of chart review for care gap closure. The following were reviewed: abstraction for care gap closure-glycemic status assessment.    [facility] Quality Team

## 2024-10-16 NOTE — Patient Instructions (Signed)
 Spirometry/DLCO performed today.

## 2024-10-16 NOTE — Progress Notes (Signed)
 Spirometry/DLCO performed today.

## 2024-10-16 NOTE — Progress Notes (Signed)
 "  @Patient  ID: Tina Berger, female    DOB: 1951/07/29, 73 y.o.   MRN: [id number]  Chief Complaint  Patient presents with   Interstitial Lung Disease    Pft F/U    Referring provider: Randeen Laine LABOR, MD  HPI: 73 year old female followed for interstitial lung disease related to connective tissue disease with rheumatoid arthritis, Raynaud's Followed by rheumatology Dr. Dolphus (On Tyenne  injections/prednisone  10mg  )     TEST/EVENTS : Reviewed 10/16/2024  Pulmonary function testing October 16, 2024 is normal and stable with FEV1 at 100%, ratio 79, FVC 94%, DLCO 87%.  High-resolution CT chest December 27, 2023 shows mild to moderate patchy subpleural reticulation bilaterally with mild traction bronchiectasis, small focus of honeycombing right middle lobe slightly progressed since 2022, small left pleural effusion, chronic severe tracheobronchomalacia  Discussed the use of AI scribe software for clinical note transcription with the patient, who gave verbal consent to proceed.  History of Present Illness Tina Berger is a 73 year old female with RA related ILD  who presents for follow-up of her lung condition.   She remains on Tyenne  injections every 2 weeks.  She is on baseline of prednisone  10 mg daily.  She is followed by rheumatology .  Patient was instructed to slowly decrease prednisone  dose.  Attempts to taper prednisone  below 5 mg resulted in significant functional decline, and she resumed taking 10 mg daily.  She says overall her breathing has been doing okay.  She denies any shortness of breath at rest.  And no dyspnea with light activities.  Walk test today in the office showed no significant desaturations O2 saturation remained 93% on room air with ambulation.  Patient had pulmonary function testing today that showed stable lung function with FEV1 at 100%.  Ratio 79, FVC 94%.,  DLCO 87%.  This is similar to previous pulmonary function test in June 2025.  ILD symptom score  today is 8.  She reports increased activity since her initial diagnosis of rheumatoid arthritis, although not as active as desired. She experienced an eye infection in February, treated with antibiotics and prednisone , which resolved over several weeks. Cataract surgery in April and May improved her vision. She has a history of dry eyes due to previous Lasik surgery and recently developed a similar infection in her left eye.  She has been on prednisone  for at least three years. She has tried other medications for her rheumatoid arthritis but experienced intolerable side effects. Her husband assists with her medication management and accompanies her to appointments.  She has a history of falls, with three incidents in the past two years, but has not fallen since April 2024. She has been cautious with her footwear and activities and engages in light exercise.    She is due for a tetanus booster and would like to get that today.  She gets her surveillance labs at rheumatology.   Allergies[1]  Immunization History  Administered Date(s) Administered   Fluad Quad(high Dose 65+) 08/07/2020, 07/13/2022   Fluad Trivalent(High Dose 65+) 06/27/2023   INFLUENZA, HIGH DOSE SEASONAL PF 08/14/2018, 07/03/2019   Influenza Split 06/19/2011   Influenza Whole 08/01/2009   Influenza,inj,Quad PF,6+ Mos 06/14/2014, 08/25/2015, 07/26/2016, 08/19/2017   Moderna Sars-Covid-2 Vaccination 09/17/2020   PFIZER(Purple Top)SARS-COV-2 Vaccination 11/27/2019, 12/18/2019   Pneumococcal Conjugate-13 08/25/2015   Pneumococcal Polysaccharide-23 08/25/2016   Td 11/25/2003   Tdap 06/14/2014   Zoster, Live 09/16/2014    Past Medical History:  Diagnosis Date   Acute renal  insufficiency 05/31/2014   pt not aware of this diagnosis   Anemia, iron deficiency    Anxiety    Bilateral lower extremity edema    a. uses torsemide    Broken wrist    did not require surgery   Cervical dysplasia    abnormal paps   Colon cancer  screening 06/14/2014   Coronary artery disease    Cough 08/08/2012   Degenerative disk disease 11/19/2011   Depression    Diabetes mellitus type II    Diverticulosis    DJD (degenerative joint disease)    Drug rash 05/22/2011   Dry eyes    Dysrhythmia    metoprolol .   Edema    Elevated liver enzymes 03/21/2012   Encounter for routine gynecological examination 06/14/2014   ESOPHAGITIS 03/28/2007   Qualifier: Hospitalized for  By: Cecilie CMA, NANNIE Ivy     Fibromyalgia    GASTRITIS 03/28/2007   Qualifier: History of  By: Cecilie CMA, NANNIE Ivy     GERD (gastroesophageal reflux disease)    Hemorrhoids    external   HLD (hyperlipidemia)    HNP (herniated nucleus pulposus) 11/1997   T6,7,8 with DJD   HTN (hypertension)    Hyperglycemia 05/13/2008   Qualifier: Diagnosis of  By: Randeen MD, Laine Caldron    Hypothyroidism    Interstitial lung disease (HCC)    pt not aware of this   Left ovarian cyst    x 3, rupture   Osteoarthritis    hands   Osteopenia    mild-11/01; improved 12/05   Other screening mammogram 08/18/2011   Palpitations    PERSONAL HISTORY ALLERGY UNSPEC MEDICINAL AGENT 03/18/2010   Qualifier: Diagnosis of  By: Randeen MD, Laine Caldron    PONV (postoperative nausea and vomiting)    Raynaud's disease    Recurrent HSV (herpes simplex virus)    lesions in nose or mouth with frequent ulcers. d/t meds and dry mouth   Rheumatoid arthritis (HCC)    Rhinitis 03/21/2012   Routine general medical examination at a health care facility 03/28/2011    Tobacco History: Tobacco Use History[2] Counseling given: Not Answered Tobacco comments: quit over 40 years   Outpatient Medications Prior to Visit  Medication Sig Dispense Refill   Acetaminophen  (TYLENOL  ARTHRITIS PAIN PO) Take 650 mg by mouth daily.     amLODipine  (NORVASC ) 5 MG tablet TAKE 1 TABLET (5 MG TOTAL) BY MOUTH DAILY. 90 tablet 2   buPROPion  (WELLBUTRIN  XL) 150 MG 24 hr tablet TAKE 1 TABLET BY MOUTH DAILY  90 tablet 3   Chlorphen-Phenyleph-ASA (ALKA-SELTZER PLUS COLD PO) Take by mouth as needed. Just for colds     cholecalciferol (VITAMIN D3) 25 MCG (1000 UNIT) tablet Take 1,000 Units by mouth daily.     cyclobenzaprine  (FLEXERIL ) 5 MG tablet Take 5 mg by mouth daily as needed.     Diclofenac  Sodium 1.5 % SOLN Place 2 mLs onto the skin 4 (four) times daily. 3 Bottle 3   DULoxetine  (CYMBALTA ) 60 MG capsule TAKE 1 CAPSULE BY MOUTH EVERY DAY 90 capsule 3   esomeprazole  (NEXIUM ) 20 MG capsule TAKE 1 CAPSULE BY MOUTH DAILY 90 capsule 3   famotidine  (PEPCID ) 40 MG tablet TAKE 1 TABLET BY MOUTH DAILY 90 tablet 3   ferrous sulfate 325 (65 FE) MG EC tablet Take 325 mg by mouth daily with breakfast.     glucose blood test strip One Touch Ultra stripts blue-To check sugar once daily and as needed for  DM2 250.00 100 each 3   GUAIFENESIN CR PO Take by mouth as needed.     hyoscyamine  (LEVSIN  SL) 0.125 MG SL tablet Take 1 tablet (0.125 mg total) by mouth 2 (two) times daily as needed.     levothyroxine  (SYNTHROID ) 125 MCG tablet TAKE 1 TABLET BY MOUTH DAILY  BEFORE BREAKFAST 90 tablet 3   LYSINE PO Take by mouth as needed. OTC for ulcer prophylaxis     Magnesium  400 MG CAPS Take by mouth daily.     metFORMIN  (GLUCOPHAGE -XR) 500 MG 24 hr tablet TAKE 2 TABLETS BY MOUTH TWICE  DAILY WITH MEALS 360 tablet 3   metoprolol  succinate (TOPROL -XL) 50 MG 24 hr tablet TAKE 1 TABLET BY MOUTH TWICE  DAILY 180 tablet 3   moxifloxacin  (AVELOX ) 400 MG tablet Take 400 mg by mouth daily.     naproxen (NAPROSYN) 250 MG tablet Take by mouth.     neomycin-polymyxin-dexameth (MAXITROL) 0.1 % OINT Place 1 Application into both eyes at bedtime.     ondansetron  (ZOFRAN ) 4 MG tablet Take 1 tablet (4 mg total) by mouth every 8 (eight) hours as needed for nausea or vomiting. 20 tablet 0   oxyCODONE  (OXYCONTIN ) 10 MG 12 hr tablet Take 10 mg by mouth 3 (three) times daily as needed. Beakthrough pain     prednisoLONE acetate (PRED FORTE) 1 %  ophthalmic suspension INSTILL 1 DROP INTO RIGHT EYE FOUR TIMES A DAY TAPER AS INSTRUCTED     predniSONE  (DELTASONE ) 10 MG tablet Take 10 mg by mouth daily.     rosuvastatin  (CRESTOR ) 5 MG tablet TAKE 1 TABLET BY MOUTH TWICE  WEEKLY 29 tablet 0   Tocilizumab -aazg (TYENNE ) 162 MG/0.9ML SOAJ Inject 162 mg into the skin every 14 (fourteen) days. 5.4 mL 0   vitamin B-12 (CYANOCOBALAMIN ) 500 MCG tablet Take 500 mcg by mouth daily.     XTAMPZA  ER 36 MG C12A Take 1 capsule by mouth every 12 (twelve) hours.     Xylitol (XYLIMELTS MT) Use as directed in the mouth or throat.     MIEBO 1.338 GM/ML SOLN  (Patient not taking: Reported on 10/16/2024)     naloxone (NARCAN) nasal spray 4 mg/0.1 mL  (Patient not taking: Reported on 10/16/2024)     predniSONE  (DELTASONE ) 5 MG tablet TAKE 1 TABLET BY MOUTH EVERY DAY WITH BREAKFAST (Patient not taking: Reported on 10/16/2024) 90 tablet 0   Facility-Administered Medications Prior to Visit  Medication Dose Route Frequency Provider Last Rate Last Admin   albuterol  (PROVENTIL ) (2.5 MG/3ML) 0.083% nebulizer solution 2.5 mg  2.5 mg Nebulization Once Ramaswamy, Murali, MD         Review of Systems:   Constitutional:   No  weight loss, night sweats,  Fevers, chills, fatigue, or  lassitude.  HEENT:   No headaches,  Difficulty swallowing,  Tooth/dental problems, or  Sore throat,                No sneezing, itching, ear ache, nasal congestion, post nasal drip,   CV:  No chest pain,  Orthopnea, PND, swelling in lower extremities, anasarca, dizziness, palpitations, syncope.   GI  No heartburn, indigestion, abdominal pain, nausea, vomiting, diarrhea, change in bowel habits, loss of appetite, bloody stools.   Resp: No shortness of breath with exertion or at rest.  No excess mucus, no productive cough,  No non-productive cough,  No coughing up of blood.  No change in color of mucus.  No wheezing.  No chest  wall deformity  Skin: no rash or lesions.  GU: no dysuria, change  in color of urine, no urgency or frequency.  No flank pain, no hematuria   MS:  No joint pain or swelling.  No decreased range of motion.  No back pain.    Physical Exam  BP 121/77   Pulse 73   Temp (!) 97.5 F (36.4 C)   Ht 5' (1.524 m) Comment: per pt  Wt 184 lb 9.6 oz (83.7 kg)   SpO2 90% Comment: ra  BMI 36.05 kg/m   GEN: A/Ox3; pleasant , NAD, well nourished    HEENT:  [place]/AT,   , NOSE-clear, THROAT-clear, no lesions, no postnasal drip or exudate noted.   NECK:  Supple w/ fair ROM; no JVD; normal carotid impulses w/o bruits; no thyromegaly or nodules palpated; no lymphadenopathy.    RESP faint bibasilar crackles. no accessory muscle use, no dullness to percussion  CARD:  RRR, no m/r/g, no peripheral edema, pulses intact, no cyanosis or clubbing.  GI:   Soft & nt; nml bowel sounds; no organomegaly or masses detected.   Musco: Warm bil, no deformities or joint swelling noted.   Neuro: alert, no focal deficits noted.    Skin: Warm, no lesions or rashes  Vitals:   10/16/24 1425 10/16/24 1509 10/16/24 1510  BP: 121/77    Pulse: 73 76 94  Temp: (!) 97.5 F (36.4 C)    Height: 5' (1.524 m) Comment: per pt    Weight: 184 lb 9.6 oz (83.7 kg)    SpO2: 90% Comment: ra 94% Comment: RA at rest 93% Comment: RA after ambulating up and down the hallway.  BMI (Calculated): 36.05       Lab Results:Reviewed 10/16/2024   CBC    Component Value Date/Time   WBC 8.6 09/10/2024 0909   RBC 4.47 09/10/2024 0909   HGB 13.2 09/10/2024 0909   HGB 13.4 07/06/2022 0908   HCT 40.3 09/10/2024 0909   HCT 40.0 07/06/2022 0908   PLT 358.0 09/10/2024 0909   PLT 319 07/06/2022 0908   MCV 90.1 09/10/2024 0909   MCV 88 07/06/2022 0908   MCV 87 03/02/2012 1923   MCH 29.0 06/12/2024 1524   MCHC 32.8 09/10/2024 0909   RDW 13.3 09/10/2024 0909   RDW 12.0 07/06/2022 0908   RDW 14.2 03/02/2012 1923   LYMPHSABS 1.6 09/10/2024 0909   LYMPHSABS 1.4 07/06/2022 0908   MONOABS 0.8  09/10/2024 0909   EOSABS 0.3 09/10/2024 0909   EOSABS 0.2 07/06/2022 0908   BASOSABS 0.1 09/10/2024 0909   BASOSABS 0.1 07/06/2022 0908    BMET    Component Value Date/Time   NA 140 09/10/2024 0909   NA 140 07/06/2022 0908   NA 135 (L) 03/06/2012 0546   K 4.4 09/10/2024 0909   K 4.9 03/06/2012 0546   CL 100 09/10/2024 0909   CL 99 03/06/2012 0546   CO2 30 09/10/2024 0909   CO2 28 03/06/2012 0546   GLUCOSE 137 (H) 09/10/2024 0909   GLUCOSE 138 (H) 03/06/2012 0546   BUN 24 (H) 09/10/2024 0909   BUN 18 07/06/2022 0908   BUN 21 (H) 03/06/2012 0546   CREATININE 0.81 09/10/2024 0909   CREATININE 0.83 06/12/2024 1524   CALCIUM  9.1 09/10/2024 0909   CALCIUM  8.4 (L) 03/06/2012 0546   GFRNONAA >60 12/26/2022 1638   GFRNONAA 64 03/24/2021 0930   GFRAA 75 03/24/2021 0930    BNP No results found for: BNP  ProBNP  Component Value Date/Time   PROBNP 136.0 (H) 07/07/2012 1309    Imaging: No results found.  Administration History     None          Latest Ref Rng & Units 10/16/2024    9:56 AM 04/09/2024   10:11 AM 06/23/2023   11:31 AM 03/01/2023    8:51 AM 03/26/2022   10:15 AM 06/02/2021   12:50 PM  PFT Results  FVC-Pre L 2.24  P 2.29  2.25  2.22  2.35  2.16   FVC-Predicted Pre % 94  P 96  93  87  91  79   FVC-Post L      2.14   FVC-Predicted Post %      78   Pre FEV1/FVC % % 79  P 79  83  83  85  81   Post FEV1/FCV % %      86   FEV1-Pre L 1.78  P 1.81  1.88  1.84  2.00  1.75   FEV1-Predicted Pre % 100  P 101  104  97  103  85   FEV1-Post L      1.83   DLCO uncorrected ml/min/mmHg 14.38  P 13.56  12.88  13.10  15.17  14.84   DLCO UNC% % 87  P 82  76  75  87  81   DLCO corrected ml/min/mmHg 14.47  P   13.99  14.70  14.84   DLCO COR %Predicted % 87  P   80  84  81   DLVA Predicted % 92  P 84  78  89  86  104   TLC L   4.00    4.29   TLC % Predicted %   89    90   RV % Predicted %   79    89     P Preliminary result    No results found for:  NITRICOXIDE      No data to display              Assessment & Plan:   Assessment and Plan Assessment & Plan ILD related to connective tissue disease with underlying rheumatoid arthritis  Appears to be stable.  Pulmonary function testing were reviewed in detail.  Stable lung function. Oxygen levels remain stable during walk tests, with no significant cough or dyspnea. Current management includes prednisone  and Tyenne  injections, which control joint pain/lfares . Long-term prednisone  use discussed in detail.  Continue to follow-up with primary care for ongoing management and monitoring of labs and bone density/    . Follow up with rheumatology as planned and monitor labs as instructed. Encourage increased physical activity, starting with five minutes and gradually increasing to thirty minutes if possible. Administered TDAP vaccine today. Plan for follow-up in six months with repeat breathing test.  Rheumatoid arthritis.  Appears stable.  Continue follow-up with rheumatology and ongoing surveillance labs          Burnetta Kohls, NP 10/16/2024  I spent  38  minutes dedicated to the care of this patient on the date of this encounter to include pre-visit review of records, face-to-face time with the patient discussing conditions above, post visit ordering of testing, clinical documentation with the electronic health record, making appropriate referrals as documented, and communicating necessary findings to members of the patients care team.      [1]  Allergies Allergen Reactions   Ceftin  [Cefuroxime ] Swelling    Swelling, legs turn blue.  Legs swelling, then blue, then a rash   Clonidine  And Derivatives     Swelling of lips   Erythromycin Swelling    Rash, swollen gums    Keflex  [Cephalexin ] Anaphylaxis    Chest was broken out in a rash. Lips were swelling. Took a few days to occur, but it kept getting worse.                                                                                                                                                                                                    Macrobid  [Nitrofurantoin ]     REACTION: ? Lung problem    Penicillins Anaphylaxis    Pt had a daughter with an allergy to this at a young age. Patient developed blisters anywhere her child touched her. Also, if she used the bathroom after daughter did while on keflex , it would cause a reaction for her. Lips and roof of mouth swelled up also.   Sulfonamide Derivatives Swelling    Rash, swollen gums, lips   Ciprofloxacin     REACTION: ? rash vs sun rxn. Rash    Fluoxetine Hcl     REACTION: stomach problems. Severe pain in abdomen. Could not function d/t pain   Furosemide Swelling    REACTION: swelling worsened in legs once taking   Gabapentin Other (See Comments)    REACTION: edema of feet. Unable to get shoes on   Hydroxychloroquine  Itching    Pt report itching felt like pre-anaphylaxis she has had with other medications   Paroxetine     REACTION: weight gain (30 pound weight gain   Pregabalin     REACTION: swelling of feet and legs. Unable to get shoes on.   Tetracycline     REACTION:inflammed genitals.    Venlafaxine     REACTION: sweating profusely   Etodolac     REACTION: reaction not known   Ramipril      Hyperkalemia    Amitriptyline Hcl     REACTION: sedating   Atorvastatin     REACTION: muscle twitch and pain   Benicar  [Olmesartan  Medoxomil]     Muscle pain    Cephalexin  Hives and Rash   Cetirizine Hcl     REACTION: headache   Diovan [Valsartan]     Thought it made her feel confused   Naproxen Sodium Other (See Comments)    REACTION: edema of feet and legs.  Not good for ckd  [2]  Social History Tobacco Use  Smoking Status Former   Current packs/day: 0.00   Average packs/day: 1 pack/day for 5.0 years (  5.0 ttl pk-yrs)   Types: Cigarettes   Start date: 10/19/1963   Quit date: 10/18/1968   Years since quitting: 56.0   Passive exposure:  Past  Smokeless Tobacco Never  Tobacco Comments   quit over 40 years   "

## 2024-10-16 NOTE — Patient Instructions (Addendum)
 Your breathing test is stable/unchanged today.  Activity as tolerated.  Follow up with Rheumatology as planned  Continue on Tyenne  injections every 14 days  Continue on Prednisone  10mg  daily  Labs per Rheumatology  TDAP shot today.  Follow up with Dr. Geronimo in 6 months with Spirometry with DLCO.

## 2024-10-22 ENCOUNTER — Other Ambulatory Visit: Payer: Self-pay

## 2024-10-23 ENCOUNTER — Other Ambulatory Visit: Payer: Self-pay

## 2024-10-24 ENCOUNTER — Other Ambulatory Visit: Payer: Self-pay

## 2024-10-24 ENCOUNTER — Other Ambulatory Visit: Payer: Self-pay | Admitting: Family Medicine

## 2024-10-24 NOTE — Progress Notes (Signed)
 Specialty Pharmacy Refill Coordination Note  Tina Berger is a 74 y.o. female contacted today regarding refills of specialty medication(s) Tocilizumab -aazg (Tyenne )   Patient requested Delivery   Delivery date: 10/31/24   Verified address: [address]  [place]   Medication will be filled on: 10/30/24  Copay approved.

## 2024-10-25 ENCOUNTER — Other Ambulatory Visit: Payer: Self-pay

## 2024-10-25 NOTE — Telephone Encounter (Signed)
 Last filled on 10/28/23 #90 caps/ 3 refills   CPE was on 09/17/24 and has this yrs CPE scheduled on 09/20/25

## 2024-10-30 ENCOUNTER — Other Ambulatory Visit: Payer: Self-pay

## 2024-11-05 NOTE — Progress Notes (Unsigned)
 "  Office Visit Note  Patient: Tina Berger             Date of Birth: 04/08/1951           MRN: [id number]             PCP: Randeen Laine LABOR, MD Referring: Tower, Laine LABOR, MD Visit Date: 11/15/2024 Occupation: Data Unavailable  Subjective:    History of Present Illness: Tina Berger is a 74 y.o. female with history of rheumatoid arthritis.  She remains on tyenne  (tocilizumab  biosimilar) 162 mg subcutaneous injections every 14 days and prednisone  10 mg daily   CBC and CMP updated on 09/10/24.   TB gold negative on  Discussed the importance of holding tyenne  if she develops signs or symptoms of an infection and to resume once the infection has completely cleared.   Activities of Daily Living:  Patient reports morning stiffness for *** {minute/hour:19697}.   Patient {ACTIONS;DENIES/REPORTS:21021675::Denies} nocturnal pain.  Difficulty dressing/grooming: {ACTIONS;DENIES/REPORTS:21021675::Denies} Difficulty climbing stairs: {ACTIONS;DENIES/REPORTS:21021675::Denies} Difficulty getting out of chair: {ACTIONS;DENIES/REPORTS:21021675::Denies} Difficulty using hands for taps, buttons, cutlery, and/or writing: {ACTIONS;DENIES/REPORTS:21021675::Denies}  No Rheumatology ROS completed.   PMFS History:  Patient Active Problem List   Diagnosis Date Noted   Long term current use of oral hypoglycemic drug 06/15/2024   Hyperkalemia 03/27/2024   Aortic atherosclerosis 09/12/2023   Frequent falls 12/29/2022   Rheumatoid arthritis (HCC) 05/27/2021   Chronic venous insufficiency 05/23/2021   AVM (arteriovenous malformation) 04/04/2021   CAD (coronary artery disease) 01/22/2021   Primary osteoarthritis of both knees 01/12/2021   Positive ANA (antinuclear antibody) 11/17/2020   Joint pain 11/13/2020   Current use of proton pump inhibitor 08/26/2020   Estrogen deficiency 08/21/2018   TMJ (dislocation of temporomandibular joint), initial encounter 05/05/2018   Anterior neck pain  05/05/2018   Chronic pain syndrome 11/09/2017   Chronic upper extremity pain [facility] Area of Pain) (Bilateral) (L>R) 11/09/2017   Fibromyalgia syndrome 11/09/2017   Osteoarthritis 11/09/2017   Osteoarthritis of lumbar facet joint (Bilateral) 11/09/2017   Lumbar facet arthropathy (Bilateral) 11/09/2017   Lumbar facet syndrome (Bilateral) (L>R) 11/09/2017   Lumbar foraminal stenosis (multilevel) (Bilateral) 11/09/2017   DDD (degenerative disc disease), lumbar 11/09/2017   Thoracic levoscoliosis 11/09/2017   Thoracic facet syndrome (Bilateral) (L>R) 11/09/2017   Thoracic facet arthropathy (Bilateral) (R>L) 11/09/2017   DDD (degenerative disc disease), thoracic 11/09/2017   Thoracolumbar IVDD 11/09/2017   DDD (degenerative disc disease), cervical 11/09/2017   Osteoarthritis of cervical facet (Bilateral) 11/09/2017   Grade 1 Anterolisthesis of C7 over T1 11/09/2017   Cervical foraminal stenosis (Bilateral) 11/09/2017   History of fusion of cervical spine (C5-6 ACDF) 11/09/2017   Cervical facet syndrome (Bilateral) 11/09/2017   Disorder of skeletal system 11/09/2017   Cervical radiculitis (Bilateral) 11/09/2017   Lumbar Epidural lipomatosis 11/09/2017   Chronic sacroiliac joint pain (Bilateral) (L>R) 11/09/2017   Chronic hip pain (Bilateral) (L>R) 11/09/2017   Chronic upper back pain (Primary Area of Pain) (midline) 10/24/2017   Chronic neck pain (Secondary Area of Pain) (Bilateral)  (L>R) 10/24/2017   Chronic low back pain (Fourth Area of Pain) (Bilateral) (L>R) 10/24/2017   Chronic lower extremity pain (Fifth Area of Pain) (Bilateral) (L>R) 10/24/2017   Lumbar Grade 1 Retrolisthesis of L1-2 and L2-3 10/24/2017   Other long term (current) drug therapy 10/24/2017   Other specified health status 10/24/2017   Long term current use of opiate analgesic 10/24/2017   Long term prescription opiate use 10/24/2017   Opiate use 10/24/2017  DM type 2 (diabetes mellitus, type 2) (HCC) 06/14/2014    Pharmacologic therapy 06/14/2014   Pedal edema    Post-menopausal 06/23/2012   Lumbar disc disease with radiculopathy 11/18/2011   HTN (hypertension) 07/30/2011   Raynaud disease 07/30/2011   Morbid obesity (HCC) 07/30/2011   Routine general medical examination at a health care facility 03/28/2011   Palpitations 04/27/2010   Depression with anxiety 06/14/2008   Vitamin D  deficiency 05/13/2008   Osteopenia 03/25/2008   Hypothyroidism 03/28/2007   Hyperlipidemia associated with type 2 diabetes mellitus (HCC) 03/28/2007   Other iron deficiency anemias 03/28/2007   PANIC ATTACK 03/28/2007   KERATOCONJUNCTIVITIS SICCA 03/28/2007   Mitral valve disorder 03/28/2007   GERD 03/28/2007   IBS 03/28/2007   Rosacea 03/28/2007   PLANTAR FASCIITIS 03/28/2007   MIGRAINES, HX OF 03/28/2007    Past Medical History:  Diagnosis Date   Acute renal insufficiency 05/31/2014   pt not aware of this diagnosis   Anemia, iron deficiency    Anxiety    Bilateral lower extremity edema    a. uses torsemide    Broken wrist    did not require surgery   Cervical dysplasia    abnormal paps   Colon cancer screening 06/14/2014   Coronary artery disease    Cough 08/08/2012   Degenerative disk disease 11/19/2011   Depression    Diabetes mellitus type II    Diverticulosis    DJD (degenerative joint disease)    Drug rash 05/22/2011   Dry eyes    Dysrhythmia    metoprolol .   Edema    Elevated liver enzymes 03/21/2012   Encounter for routine gynecological examination 06/14/2014   ESOPHAGITIS 03/28/2007   Qualifier: Hospitalized for  By: Cecilie CMA, NANNIE Ivy     Fibromyalgia    GASTRITIS 03/28/2007   Qualifier: History of  By: Cecilie CMA, NANNIE Ivy     GERD (gastroesophageal reflux disease)    Hemorrhoids    external   HLD (hyperlipidemia)    HNP (herniated nucleus pulposus) 11/1997   T6,7,8 with DJD   HTN (hypertension)    Hyperglycemia 05/13/2008   Qualifier: Diagnosis of  By: Randeen  MD, Laine Caldron    Hypothyroidism    Interstitial lung disease (HCC)    pt not aware of this   Left ovarian cyst    x 3, rupture   Osteoarthritis    hands   Osteopenia    mild-11/01; improved 12/05   Other screening mammogram 08/18/2011   Palpitations    PERSONAL HISTORY ALLERGY UNSPEC MEDICINAL AGENT 03/18/2010   Qualifier: Diagnosis of  By: Randeen MD, Laine Caldron    PONV (postoperative nausea and vomiting)    Raynaud's disease    Recurrent HSV (herpes simplex virus)    lesions in nose or mouth with frequent ulcers. d/t meds and dry mouth   Rheumatoid arthritis (HCC)    Rhinitis 03/21/2012   Routine general medical examination at a health care facility 03/28/2011    Family History  Problem Relation Age of Onset   Lung cancer Mother        + smoker   Coronary artery disease Mother        relatively young   Emphysema Father        + smoker   Lymphoma Sister    Heart disease Brother    Lymphoma Brother    Parkinson's disease Brother    Anemia Brother        aplastic  Diabetes Brother    Iron deficiency Daughter    Leukemia Son    Breast cancer Neg Hx    Past Surgical History:  Procedure Laterality Date   ABDOMINAL EXPLORATION SURGERY     ACDF  1991   BIOPSY OF SKIN SUBCUTANEOUS TISSUE AND/OR MUCOUS MEMBRANE Left 04/08/2021   Procedure: BIOPSY OF SKIN SUBCUTANEOUS TISSUE AND/OR MUCOUS MEMBRANE ( REMOVAL SKIN NODULE);  Surgeon: Jama Cordella MATSU, MD;  Location: [facility];  Service: Vascular;  Laterality: Left;   CATARACT EXTRACTION W/PHACO Right 02/07/2024   Procedure: PHACOEMULSIFICATION, CATARACT, WITH IOL INSERTION 8.20 00:48.7;  Surgeon: Jaye Fallow, MD;  Location: [facility];  Service: Ophthalmology;  Laterality: Right;   CATARACT EXTRACTION W/PHACO Left 02/21/2024   Procedure: PHACOEMULSIFICATION, CATARACT, WITH IOL INSERTION 5.42 00:40.0;  Surgeon: Jaye Fallow, MD;  Location: [facility];  Service: Ophthalmology;  Laterality: Left;    COLONOSCOPY  08/2000   Diverticulosis; hemorrhoids   HAND SURGERY Left    left thumb. PIN REMOVED   LASIK     bilateral   Social History[1] Social History   Social History Narrative   Married1 [employer] Manager to dean at [employer] regularly exerciseDaily caffeine use: 2/day.   Lives with husband. Feels safe in her home.     Immunization History  Administered Date(s) Administered   Fluad Quad(high Dose 65+) 08/07/2020, 07/13/2022   Fluad Trivalent(High Dose 65+) 06/27/2023   INFLUENZA, HIGH DOSE SEASONAL PF 08/14/2018, 07/03/2019   Influenza Split 06/19/2011   Influenza Whole 08/01/2009   Influenza,inj,Quad PF,6+ Mos 06/14/2014, 08/25/2015, 07/26/2016, 08/19/2017   Moderna Sars-Covid-2 Vaccination 09/17/2020   PFIZER(Purple Top)SARS-COV-2 Vaccination 11/27/2019, 12/18/2019   Pneumococcal Conjugate-13 08/25/2015   Pneumococcal Polysaccharide-23 08/25/2016   Td 11/25/2003   Tdap 06/14/2014, 10/16/2024   Zoster, Live 09/16/2014     Objective: Vital Signs: There were no vitals taken for this visit.   Physical Exam Vitals and nursing note reviewed.  Constitutional:      Appearance: She is well-developed.  HENT:     Head: Normocephalic and atraumatic.  Eyes:     Conjunctiva/sclera: Conjunctivae normal.  Cardiovascular:     Rate and Rhythm: Normal rate and regular rhythm.     Heart sounds: Normal heart sounds.  Pulmonary:     Effort: Pulmonary effort is normal.     Breath sounds: Normal breath sounds.  Abdominal:     General: Bowel sounds are normal.     Palpations: Abdomen is soft.  Musculoskeletal:     Cervical back: Normal range of motion.  Lymphadenopathy:     Cervical: No cervical adenopathy.  Skin:    General: Skin is warm and dry.     Capillary Refill: Capillary refill takes less than 2 seconds.  Neurological:     Mental Status: She is alert and oriented to person, place, and time.  Psychiatric:        Behavior: Behavior normal.      Musculoskeletal  Exam: ***  CDAI Exam: CDAI Score: -- Patient Global: --; Provider Global: -- Swollen: --; Tender: -- Joint Exam 11/15/2024   No joint exam has been documented for this visit   There is currently no information documented on the homunculus. Go to the Rheumatology activity and complete the homunculus joint exam.  Investigation: No additional findings.  Imaging: No results found.  Recent Labs: Lab Results  Component Value Date   WBC 8.6 09/10/2024   HGB 13.2 09/10/2024   PLT 358.0 09/10/2024   NA 140 09/10/2024   K  4.4 09/10/2024   CL 100 09/10/2024   CO2 30 09/10/2024   GLUCOSE 137 (H) 09/10/2024   BUN 24 (H) 09/10/2024   CREATININE 0.81 09/10/2024   BILITOT 0.4 09/10/2024   ALKPHOS 46 09/10/2024   AST 12 09/10/2024   ALT 9 09/10/2024   PROT 7.0 09/10/2024   ALBUMIN 4.3 09/10/2024   CALCIUM  9.1 09/10/2024   GFRAA 75 03/24/2021   QFTBGOLDPLUS NEGATIVE 03/13/2024    Speciality Comments: Arava - mood disorder HCQ-vomiting  Procedures:  No procedures performed Allergies: Ceftin  [cefuroxime ], Clonidine  and derivatives, Erythromycin, Keflex  [cephalexin ], Macrobid  [nitrofurantoin ], Penicillins, Sulfonamide derivatives, Ciprofloxacin, Fluoxetine hcl, Furosemide, Gabapentin, Hydroxychloroquine , Paroxetine, Pregabalin, Tetracycline, Venlafaxine, Etodolac, Ramipril , Amitriptyline hcl, Atorvastatin, Benicar  [olmesartan  medoxomil], Cephalexin , Cetirizine hcl, Diovan [valsartan], and Naproxen sodium   Assessment / Plan:     Visit Diagnoses: No diagnosis found.  Orders: No orders of the defined types were placed in this encounter.  No orders of the defined types were placed in this encounter.   Face-to-face time spent with patient was *** minutes. Greater than 50% of time was spent in counseling and coordination of care.  Follow-Up Instructions: No follow-ups on file.   Shelba SHAUNNA Potters, RT  Note - This record has been created using [employer].  Chart creation  errors have been sought, but may not always  have been located. Such creation errors do not reflect on  the standard of medical care.     [1]  Social History Tobacco Use   Smoking status: Former    Current packs/day: 0.00    Average packs/day: 1 pack/day for 5.0 years (5.0 ttl pk-yrs)    Types: Cigarettes    Start date: 10/19/1963    Quit date: 10/18/1968    Years since quitting: 56.0    Passive exposure: Past   Smokeless tobacco: Never   Tobacco comments:    quit over 40 years  Vaping Use   Vaping status: Never Used  Substance Use Topics   Alcohol use: No    Alcohol/week: 0.0 standard drinks of alcohol   Drug use: No   "

## 2024-11-15 ENCOUNTER — Ambulatory Visit: Admitting: Physician Assistant

## 2024-11-15 DIAGNOSIS — J849 Interstitial pulmonary disease, unspecified: Secondary | ICD-10-CM

## 2024-11-15 DIAGNOSIS — M0609 Rheumatoid arthritis without rheumatoid factor, multiple sites: Secondary | ICD-10-CM

## 2024-11-15 DIAGNOSIS — M170 Bilateral primary osteoarthritis of knee: Secondary | ICD-10-CM

## 2024-11-15 DIAGNOSIS — M5030 Other cervical disc degeneration, unspecified cervical region: Secondary | ICD-10-CM

## 2024-11-15 DIAGNOSIS — G894 Chronic pain syndrome: Secondary | ICD-10-CM

## 2024-11-15 DIAGNOSIS — E119 Type 2 diabetes mellitus without complications: Secondary | ICD-10-CM

## 2024-11-15 DIAGNOSIS — Z981 Arthrodesis status: Secondary | ICD-10-CM

## 2024-11-15 DIAGNOSIS — M47816 Spondylosis without myelopathy or radiculopathy, lumbar region: Secondary | ICD-10-CM

## 2024-11-15 DIAGNOSIS — M8589 Other specified disorders of bone density and structure, multiple sites: Secondary | ICD-10-CM

## 2024-11-15 DIAGNOSIS — I10 Essential (primary) hypertension: Secondary | ICD-10-CM

## 2024-11-15 DIAGNOSIS — Z8719 Personal history of other diseases of the digestive system: Secondary | ICD-10-CM

## 2024-11-15 DIAGNOSIS — F418 Other specified anxiety disorders: Secondary | ICD-10-CM

## 2024-11-15 DIAGNOSIS — Z79899 Other long term (current) drug therapy: Secondary | ICD-10-CM

## 2024-11-15 DIAGNOSIS — Z8781 Personal history of (healed) traumatic fracture: Secondary | ICD-10-CM

## 2024-11-15 DIAGNOSIS — I7300 Raynaud's syndrome without gangrene: Secondary | ICD-10-CM

## 2024-11-15 DIAGNOSIS — Z79891 Long term (current) use of opiate analgesic: Secondary | ICD-10-CM

## 2024-11-15 DIAGNOSIS — M5134 Other intervertebral disc degeneration, thoracic region: Secondary | ICD-10-CM

## 2024-11-15 DIAGNOSIS — G8929 Other chronic pain: Secondary | ICD-10-CM

## 2024-11-15 DIAGNOSIS — L97929 Non-pressure chronic ulcer of unspecified part of left lower leg with unspecified severity: Secondary | ICD-10-CM

## 2024-11-15 DIAGNOSIS — Z8639 Personal history of other endocrine, nutritional and metabolic disease: Secondary | ICD-10-CM

## 2024-11-15 DIAGNOSIS — Z8774 Personal history of (corrected) congenital malformations of heart and circulatory system: Secondary | ICD-10-CM

## 2024-11-15 DIAGNOSIS — E559 Vitamin D deficiency, unspecified: Secondary | ICD-10-CM

## 2024-11-15 DIAGNOSIS — E1169 Type 2 diabetes mellitus with other specified complication: Secondary | ICD-10-CM

## 2024-11-15 DIAGNOSIS — N2889 Other specified disorders of kidney and ureter: Secondary | ICD-10-CM

## 2024-11-15 DIAGNOSIS — M797 Fibromyalgia: Secondary | ICD-10-CM

## 2024-11-15 DIAGNOSIS — I059 Rheumatic mitral valve disease, unspecified: Secondary | ICD-10-CM

## 2024-11-15 DIAGNOSIS — Z7952 Long term (current) use of systemic steroids: Secondary | ICD-10-CM

## 2024-11-20 ENCOUNTER — Other Ambulatory Visit: Payer: Self-pay

## 2024-11-21 ENCOUNTER — Ambulatory Visit
Admission: RE | Admit: 2024-11-21 | Discharge: 2024-11-21 | Disposition: A | Source: Ambulatory Visit | Attending: Family Medicine | Admitting: Family Medicine

## 2024-11-21 DIAGNOSIS — Z1231 Encounter for screening mammogram for malignant neoplasm of breast: Secondary | ICD-10-CM

## 2024-11-22 ENCOUNTER — Other Ambulatory Visit (HOSPITAL_COMMUNITY): Payer: Self-pay

## 2024-11-22 NOTE — Progress Notes (Signed)
 Specialty Pharmacy Refill Coordination Note  Tina Berger is a 74 y.o. female contacted today regarding refills of specialty medication(s) Tocilizumab -aazg (Tyenne )   Patient requested Delivery   Delivery date: 11/28/24   Verified address: [address]  [place]   Medication will be filled on: 11/27/24

## 2025-01-10 ENCOUNTER — Ambulatory Visit: Admitting: Physician Assistant

## 2025-04-04 ENCOUNTER — Encounter

## 2025-04-04 ENCOUNTER — Ambulatory Visit: Admitting: Internal Medicine

## 2025-09-20 ENCOUNTER — Ambulatory Visit

## 2025-09-20 ENCOUNTER — Encounter: Admitting: Family Medicine
